# Patient Record
Sex: Female | Born: 1945
Health system: Southern US, Community
[De-identification: ages and names within clinical notes are randomized; demographics above are authoritative.]

## PROBLEM LIST (undated history)

## (undated) DIAGNOSIS — J449 Chronic obstructive pulmonary disease, unspecified: Secondary | ICD-10-CM

## (undated) DIAGNOSIS — C44622 Squamous cell carcinoma of skin of right upper limb, including shoulder: Secondary | ICD-10-CM

## (undated) DIAGNOSIS — J9602 Acute respiratory failure with hypercapnia: Secondary | ICD-10-CM

## (undated) DIAGNOSIS — J189 Pneumonia, unspecified organism: Secondary | ICD-10-CM

## (undated) DIAGNOSIS — K259 Gastric ulcer, unspecified as acute or chronic, without hemorrhage or perforation: Secondary | ICD-10-CM

## (undated) DIAGNOSIS — C801 Malignant (primary) neoplasm, unspecified: Secondary | ICD-10-CM

## (undated) DIAGNOSIS — J9601 Acute respiratory failure with hypoxia: Secondary | ICD-10-CM

## (undated) DIAGNOSIS — E119 Type 2 diabetes mellitus without complications: Secondary | ICD-10-CM

## (undated) DIAGNOSIS — K859 Acute pancreatitis without necrosis or infection, unspecified: Secondary | ICD-10-CM

## (undated) DIAGNOSIS — I509 Heart failure, unspecified: Secondary | ICD-10-CM

## (undated) DIAGNOSIS — J811 Chronic pulmonary edema: Secondary | ICD-10-CM

## (undated) DIAGNOSIS — I10 Essential (primary) hypertension: Secondary | ICD-10-CM

## (undated) DIAGNOSIS — G9340 Encephalopathy, unspecified: Secondary | ICD-10-CM

## (undated) HISTORY — PX: CARPAL TUNNEL RELEASE: SHX101

## (undated) HISTORY — PX: HAND SURGERY: SHX662

## (undated) HISTORY — DX: Chronic pulmonary edema: J81.1

## (undated) HISTORY — PX: CORONARY ANGIOPLASTY WITH STENT PLACEMENT: SHX49

## (undated) HISTORY — PX: ABDOMINAL HYSTERECTOMY: SHX81

## (undated) HISTORY — PX: KNEE SURGERY: SHX244

## (undated) HISTORY — DX: Squamous cell carcinoma of skin of right upper limb, including shoulder: C44.622

## (undated) HISTORY — DX: Gastric ulcer, unspecified as acute or chronic, without hemorrhage or perforation: K25.9

## (undated) HISTORY — DX: Encephalopathy, unspecified: G93.40

## (undated) HISTORY — PX: TOTAL ABDOMINAL HYSTERECTOMY: SHX209

## (undated) HISTORY — DX: Acute respiratory failure with hypercapnia: J96.02

## (undated) HISTORY — DX: Acute respiratory failure with hypoxia: J96.01

## (undated) HISTORY — DX: Pneumonia, unspecified organism: J18.9

## (undated) HISTORY — DX: Acute pancreatitis without necrosis or infection, unspecified: K85.90

---

## 2005-07-01 ENCOUNTER — Ambulatory Visit: Payer: Self-pay | Admitting: General Practice

## 2014-01-15 ENCOUNTER — Inpatient Hospital Stay: Payer: Self-pay | Admitting: Internal Medicine

## 2014-01-15 LAB — CBC WITH DIFFERENTIAL/PLATELET
Basophil #: 0.1 10*3/uL (ref 0.0–0.1)
Basophil %: 0.9 %
EOS ABS: 0 10*3/uL (ref 0.0–0.7)
EOS PCT: 0.5 %
HCT: 48.4 % — ABNORMAL HIGH (ref 35.0–47.0)
HGB: 16 g/dL (ref 12.0–16.0)
LYMPHS ABS: 1.6 10*3/uL (ref 1.0–3.6)
Lymphocyte %: 21.1 %
MCH: 27.7 pg (ref 26.0–34.0)
MCHC: 33 g/dL (ref 32.0–36.0)
MCV: 84 fL (ref 80–100)
Monocyte #: 0.4 x10 3/mm (ref 0.2–0.9)
Monocyte %: 5.7 %
NEUTROS ABS: 5.5 10*3/uL (ref 1.4–6.5)
Neutrophil %: 71.8 %
PLATELETS: 162 10*3/uL (ref 150–440)
RBC: 5.77 10*6/uL — ABNORMAL HIGH (ref 3.80–5.20)
RDW: 14 % (ref 11.5–14.5)
WBC: 7.6 10*3/uL (ref 3.6–11.0)

## 2014-01-15 LAB — URINALYSIS, COMPLETE
Bacteria: NONE SEEN
Bilirubin,UR: NEGATIVE
Blood: NEGATIVE
Glucose,UR: >=500 mg/dL (ref 0–75)
Leukocyte Esterase: NEGATIVE
NITRITE: NEGATIVE
Ph: 5 (ref 4.5–8.0)
Protein: 30
RBC, UR: NONE SEEN /HPF (ref 0–5)
Specific Gravity: 1.028 (ref 1.003–1.030)
Squamous Epithelial: 1
WBC UR: <1 /HPF (ref 0–5)

## 2014-01-15 LAB — COMPREHENSIVE METABOLIC PANEL
Albumin: 3.7 g/dL (ref 3.4–5.0)
Alkaline Phosphatase: 125 U/L — ABNORMAL HIGH
Anion Gap: 6 — ABNORMAL LOW (ref 7–16)
BUN: 11 mg/dL (ref 7–18)
Bilirubin,Total: 0.4 mg/dL (ref 0.2–1.0)
CHLORIDE: 102 mmol/L (ref 98–107)
Calcium, Total: 9.2 mg/dL (ref 8.5–10.1)
Co2: 26 mmol/L (ref 21–32)
Creatinine: 0.52 mg/dL — ABNORMAL LOW (ref 0.60–1.30)
EGFR (Non-African Amer.): >60
Glucose: 270 mg/dL — ABNORMAL HIGH (ref 65–99)
Osmolality: 277 (ref 275–301)
POTASSIUM: 4.2 mmol/L (ref 3.5–5.1)
SGOT(AST): 27 U/L (ref 15–37)
SGPT (ALT): 28 U/L (ref 12–78)
Sodium: 134 mmol/L — ABNORMAL LOW (ref 136–145)
Total Protein: 7.3 g/dL (ref 6.4–8.2)

## 2014-01-15 LAB — TROPONIN I: Troponin-I: <0.02 ng/mL

## 2014-01-15 LAB — PROTIME-INR
INR: 0.9
Prothrombin Time: 12.5 secs (ref 11.5–14.7)

## 2014-01-15 LAB — CK TOTAL AND CKMB (NOT AT ARMC)
CK, TOTAL: 36 U/L
CK-MB: 0.8 ng/mL (ref 0.5–3.6)

## 2014-01-15 LAB — HEMOGLOBIN A1C: Hemoglobin A1C: 10.8 % — ABNORMAL HIGH (ref 4.2–6.3)

## 2014-01-15 LAB — CK-MB: CK-MB: 0.9 ng/mL (ref 0.5–3.6)

## 2014-01-15 LAB — PRO B NATRIURETIC PEPTIDE: B-Type Natriuretic Peptide: 63 pg/mL (ref 0–125)

## 2014-01-15 LAB — APTT: Activated PTT: 25.6 secs (ref 23.6–35.9)

## 2014-01-16 LAB — CBC WITH DIFFERENTIAL/PLATELET
Basophil #: 0 10*3/uL (ref 0.0–0.1)
Basophil %: 0.3 %
Eosinophil #: 0 10*3/uL (ref 0.0–0.7)
Eosinophil %: 0 %
HCT: 47.6 % — AB (ref 35.0–47.0)
HGB: 15.7 g/dL (ref 12.0–16.0)
LYMPHS ABS: 0.6 10*3/uL — AB (ref 1.0–3.6)
Lymphocyte %: 8.2 %
MCH: 27.6 pg (ref 26.0–34.0)
MCHC: 32.9 g/dL (ref 32.0–36.0)
MCV: 84 fL (ref 80–100)
MONO ABS: 0.1 x10 3/mm — AB (ref 0.2–0.9)
MONOS PCT: 1 %
Neutrophil #: 6.1 10*3/uL (ref 1.4–6.5)
Neutrophil %: 90.5 %
PLATELETS: 176 10*3/uL (ref 150–440)
RBC: 5.67 10*6/uL — ABNORMAL HIGH (ref 3.80–5.20)
RDW: 14.1 % (ref 11.5–14.5)
WBC: 6.7 10*3/uL (ref 3.6–11.0)

## 2014-01-16 LAB — LIPID PANEL
CHOLESTEROL: 258 mg/dL — AB (ref 0–200)
HDL Cholesterol: 35 mg/dL — ABNORMAL LOW (ref 40–60)
LDL CHOLESTEROL, CALC: 156 mg/dL — AB (ref 0–100)
TRIGLYCERIDES: 337 mg/dL — AB (ref 0–200)
VLDL Cholesterol, Calc: 67 mg/dL — ABNORMAL HIGH (ref 5–40)

## 2014-01-16 LAB — TROPONIN I: Troponin-I: <0.02 ng/mL

## 2014-01-16 LAB — CK-MB: CK-MB: 0.7 ng/mL (ref 0.5–3.6)

## 2014-01-17 LAB — URINE CULTURE

## 2014-01-20 LAB — CULTURE, BLOOD (SINGLE)

## 2014-12-22 NOTE — Discharge Summary (Signed)
PATIENT NAME:  Sarah White, Sarah White MR#:  [id number] DATE OF BIRTH:  1946-06-26  DATE OF ADMISSION:  01/15/2014 DATE OF DISCHARGE:  01/17/2014  ADMISSION DIAGNOSIS: 1.  Acute chronic obstructive pulmonary disease exacerbation.   DISCHARGE DIAGNOSES: 1.  Acute chronic obstructive pulmonary disease exacerbation with no formal diagnosis of chronic obstructive pulmonary disease; however, on chest x-ray, it is consistent with chronic obstructive pulmonary disease.  2.  New-onset diabetes.  3.  Elevated blood pressure.  4.  Tobacco dependence. 5.  History of coronary artery disease.  6.  Elevated cholesterol.   CONSULTATIONS:  None.   LABORATORY DATA AT DISCHARGE: White blood cells 6.7, hemoglobin 16, hematocrit 48. Platelets are 176.   Cholesterol 258. LDL is 156, HDL 35. VLDL is 67.   Hemoglobin A1c was 10.1.   HOSPITAL COURSE:  A 69 year old female presented with shortness of breath, found to have COPD exacerbation. For further details, please refer to the H and P.  1. Chronic obstructive pulmonary disease exacerbation. The patient has no formal diagnosis of COPD, but her chest x-ray was showing COPD. She does have ongoing use of tobacco dependence. The patient was initially placed on oxygen, did well with oxygen. This has been weaned off. She is not requiring oxygen at discharge. She has no wheezing. Her lungs are clear to auscultation. She will be weaned off her steroids, continue on inhalers and azithromycin.  2.  New-onset diabetes. I discussed briefly with the patient about diabetes. We also had inpatient diabetes education, as well as outpatient referral. I did recommend that patient start on Lantus, but she was very adamant about not starting any kind of insulin at this time. She did say the metformin would be okay to start on. I reviewed the side effects, alternatives, benefits and risks of metformin, and she will start on this.  3.  Elevated blood pressure. We increased her Norvasc dose.   4.  Tobacco dependence. The patient was counseled. Nicotine patch was placed.  5.  History of coronary artery disease. The patient will continue on aspirin and metoprolol. Her cholesterol is elevated, but this will be referred to her PCP.   DISCHARGE MEDICATIONS: 1.  Aspirin 81 mg daily.  2.  Prednisone taper starting at 50 mg, taper by 10 mg every 2 days.  3.  Azithromycin 500 mg daily for 3 days.  4.  Metformin 500 mg b.i.d.  5.  Metoprolol 50 mg b.i.d.  6.  Spiriva 18 mcg daily.  7.  Norvasc 10 mg daily.  8.  Nicotine patch 14 mg per 24 hours.   DISCHARGE DIET:  Low sodium, ADA diet.   DISCHARGE ACTIVITY: As tolerated.   DISCHARGE REFERRAL: Diabetes education.   DISCHARGE FOLLOWUP:  We did arrange a [facility] followup.     TIME SPENT: 40 minutes.   The patient was medically stable for discharge.     ____________________________ Cordarrius Coad P. Benjie Karvonen, MD spm:dmm D: 01/17/2014 12:40:29 ET T: 01/17/2014 13:02:07 ET JOB#: [id number]  cc: Ayani Ospina P. Benjie Karvonen, MD, <Dictator> [facility] P Chanae Gemma MD ELECTRONICALLY SIGNED 01/17/2014 13:46

## 2014-12-22 NOTE — H&P (Signed)
PATIENT NAME:  Sarah White, TEXEIRA MR#:  [id number] DATE OF BIRTH:  [birth year]  DATE OF ADMISSION:  01/15/2014  PRIMARY CARE PHYSICIAN:  None.  EMERGENCY ROOM PHYSICIAN:  Latina Craver, MD  CHIEF COMPLAINT: Trouble breathing.   HISTORY OF PRESENT ILLNESS: The patient is a 69 year old female patient, not seen primary doctor for a long time.  Does not have primary doctor.  Comes in because of shortness of breath. The patient has been having some cough, trouble breathing, and fever for about 1 week. The patient denies any chest pain. Does have lots of wheezing and cough for about a week. Did not try any medications for that.  Patient here has blood pressure of 224/125 when she came and also found to have significant wheezing.  We are admitting her for COPD exacerbation and malignant hypertension.    PAST MEDICAL HISTORY: Significant for coronary artery disease with 2 stents placement. The patient had 2 stents placed 12 years ago.   MEDICATIONS:  Takes only baby aspirin.   ALLERGIES: No known allergies.   SOCIAL HISTORY: Smokes half pack per day for 40 years. No alcohol. No drugs. The patient works at [employer] as a Radiation protection practitioner.   PAST SURGICAL HISTORY: Significant for 2 stents placed, carpal tunnel release, and left total knee replacement.   FAMILY HISTORY: Significant for mother had CHF.    REVIEW OF SYSTEMS:  CONSTITUTIONAL: Has fever and fatigue. EYES:  No blurred vision.  EARS, NOSE, AND THROAT: No tinnitus. No ear pain. No epistaxis. No difficulty swallowing.  RESPIRATORY: Has cough, wheezing, and shortness of breath.  CARDIOVASCULAR: No chest pain. Feels chest tightness due to trouble breathing. No orthopnea, no PND.  GASTROINTESTINAL: No nausea, no vomiting or abdominal pain.  GENITOURINARY:  No dysuria. ENDOCRINE: No polyuria, nocturia.  INTEGUMENT: No skin rashes.  MUSCULOSKELETAL:  No joint pain.  NEUROLOGIC: No numbness or weakness.   PSYCHIATRIC:  Feels a lot  of stress at work.  No depression.     PHYSICAL EXAMINATION:  VITAL SIGNS: Temperature 98.3, heart rate 112. Blood pressure initially 224/125.  During my visit, it was 187/80. Saturations, she is 94% on 2 liters, 90% on room air.  GENERAL: She is alert, awake, oriented, elderly female not in distress, answering questions appropriately.  HEAD: Atraumatic, normocephalic.  EYES: Pupils equal, reacting to light. Extraocular movements are intact.  EARS, NOSE, AND THROAT:  No tympanic membrane congestion. No turbinate hypertrophy. No oropharyngeal erythema.  NECK: Supple. No JVD. No carotid bruit.  CARDIOVASCULAR: S1, S2 regular, tachycardic. Peripheral pulses are intact. Peripheral pulses are equal at carotid.  Doralis Pedis and femoral artery. RESPIRATORY: Bilateral expiratory wheeze in all lung fields. The patient has been having terrible cough and the patient not using accessory muscles of respiration.  SKIN: Inspection, warm and dry.  MUSCULOSKELETAL: Normal gait and station.  EXTREMITIES:   (no edema,no cyanosis,all extremities move x 4.    LYMPHATIC: no  lymphadenopathy in axilla or cervical regions. NEUROLOGIC: Alert, awake, oriented neurologically  intact.DTR 2+  bilaterally. ,no sensory deficit. PSYCHIATRIC: Mood and affect are within normal limits.   DIAGNOSTIC DATA:  UA is yellow-colored urine, hazy urine. Chest x-ray shows COPD, no evidence of superimposed cardiac disease. INR 0.9, BNP 63. WBC 7.8, hemoglobin 16, hematocrit 48.4, platelets 162,000.   Electrolytes: Sodium is 134, potassium 4.2, chloride 102, bicarb 26, BUN 11, creatinine 0.5, glucose 270. The patient's alkaline phosphatase 125.  ABG: pH is 7.38, pCO2 of 46, pO2 of 57  and this is done on room air.   ASSESSMENT AND PLAN:  1. The patient is a 69 year old female with chronic obstructive pulmonary disease exacerbation. The patient's oxygen saturations were 90% on room air. The patient has hypoxia with respiratory failure  secondary to chronic obstructive pulmonary disease exacerbation and bronchitis. Admit to hospitalist service on telemetry. Continue IV steroids along with Rocephin, Zithromax, nebulizers, and oxygen and see how she does.  2. History of coronary artery disease with 2 stents, now has malignant hypertension. Likely the blood pressure is likely secondary to her trouble breathing and cough.  We started her on beta blockers along with Norvasc.  We can use hydralazine 20 mg IV q. 4 hours p.r.n. for if BP  more than 160/90.  3. Tobacco abuse. Counseled against smoking for 3 minutes. The patient says she is in a lot of stress and she will consider it.   TIME SPENT:  About 55 minutes on this history and physical:    ____________________________ Epifanio Lesches, MD sk:dd D: 01/15/2014 18:03:51 ET T: 01/15/2014 19:08:53 ET JOB#: [id number]  cc: Epifanio Lesches, MD, <Dictator> Epifanio Lesches MD ELECTRONICALLY SIGNED 01/25/2014 12:38

## 2015-07-17 ENCOUNTER — Emergency Department: Payer: BLUE CROSS/BLUE SHIELD

## 2015-07-17 ENCOUNTER — Inpatient Hospital Stay
Admission: EM | Admit: 2015-07-17 | Discharge: 2015-07-25 | DRG: 193 | Disposition: A | Discharge status: Home / Self Care | Payer: BLUE CROSS/BLUE SHIELD | Attending: Internal Medicine | Admitting: Internal Medicine

## 2015-07-17 ENCOUNTER — Encounter: Payer: Self-pay | Admitting: Emergency Medicine

## 2015-07-17 DIAGNOSIS — G9340 Encephalopathy, unspecified: Secondary | ICD-10-CM | POA: Diagnosis not present

## 2015-07-17 DIAGNOSIS — E1122 Type 2 diabetes mellitus with diabetic chronic kidney disease: Secondary | ICD-10-CM

## 2015-07-17 DIAGNOSIS — Z66 Do not resuscitate: Secondary | ICD-10-CM | POA: Diagnosis present

## 2015-07-17 DIAGNOSIS — I2510 Atherosclerotic heart disease of native coronary artery without angina pectoris: Secondary | ICD-10-CM | POA: Diagnosis present

## 2015-07-17 DIAGNOSIS — F17200 Nicotine dependence, unspecified, uncomplicated: Secondary | ICD-10-CM | POA: Diagnosis present

## 2015-07-17 DIAGNOSIS — F17210 Nicotine dependence, cigarettes, uncomplicated: Secondary | ICD-10-CM | POA: Diagnosis present

## 2015-07-17 DIAGNOSIS — I110 Hypertensive heart disease with heart failure: Secondary | ICD-10-CM | POA: Diagnosis present

## 2015-07-17 DIAGNOSIS — Y929 Unspecified place or not applicable: Secondary | ICD-10-CM

## 2015-07-17 DIAGNOSIS — E119 Type 2 diabetes mellitus without complications: Secondary | ICD-10-CM

## 2015-07-17 DIAGNOSIS — G92 Toxic encephalopathy: Secondary | ICD-10-CM | POA: Diagnosis not present

## 2015-07-17 DIAGNOSIS — J449 Chronic obstructive pulmonary disease, unspecified: Secondary | ICD-10-CM

## 2015-07-17 DIAGNOSIS — Z7982 Long term (current) use of aspirin: Secondary | ICD-10-CM | POA: Diagnosis not present

## 2015-07-17 DIAGNOSIS — J189 Pneumonia, unspecified organism: Principal | ICD-10-CM

## 2015-07-17 DIAGNOSIS — J9690 Respiratory failure, unspecified, unspecified whether with hypoxia or hypercapnia: Secondary | ICD-10-CM | POA: Diagnosis not present

## 2015-07-17 DIAGNOSIS — Z955 Presence of coronary angioplasty implant and graft: Secondary | ICD-10-CM | POA: Diagnosis not present

## 2015-07-17 DIAGNOSIS — R000 Tachycardia, unspecified: Secondary | ICD-10-CM | POA: Diagnosis present

## 2015-07-17 DIAGNOSIS — R0902 Hypoxemia: Secondary | ICD-10-CM

## 2015-07-17 DIAGNOSIS — I5021 Acute systolic (congestive) heart failure: Secondary | ICD-10-CM

## 2015-07-17 DIAGNOSIS — R42 Dizziness and giddiness: Secondary | ICD-10-CM | POA: Diagnosis present

## 2015-07-17 DIAGNOSIS — R748 Abnormal levels of other serum enzymes: Secondary | ICD-10-CM | POA: Diagnosis present

## 2015-07-17 DIAGNOSIS — I5023 Acute on chronic systolic (congestive) heart failure: Secondary | ICD-10-CM | POA: Diagnosis present

## 2015-07-17 DIAGNOSIS — J9602 Acute respiratory failure with hypercapnia: Secondary | ICD-10-CM | POA: Diagnosis not present

## 2015-07-17 DIAGNOSIS — Z8542 Personal history of malignant neoplasm of other parts of uterus: Secondary | ICD-10-CM | POA: Diagnosis not present

## 2015-07-17 DIAGNOSIS — T424X5A Adverse effect of benzodiazepines, initial encounter: Secondary | ICD-10-CM | POA: Diagnosis not present

## 2015-07-17 DIAGNOSIS — J9601 Acute respiratory failure with hypoxia: Secondary | ICD-10-CM | POA: Diagnosis present

## 2015-07-17 DIAGNOSIS — J9622 Acute and chronic respiratory failure with hypercapnia: Secondary | ICD-10-CM | POA: Diagnosis present

## 2015-07-17 DIAGNOSIS — J811 Chronic pulmonary edema: Secondary | ICD-10-CM | POA: Diagnosis not present

## 2015-07-17 DIAGNOSIS — M545 Low back pain, unspecified: Secondary | ICD-10-CM

## 2015-07-17 DIAGNOSIS — I129 Hypertensive chronic kidney disease with stage 1 through stage 4 chronic kidney disease, or unspecified chronic kidney disease: Secondary | ICD-10-CM

## 2015-07-17 DIAGNOSIS — J9621 Acute and chronic respiratory failure with hypoxia: Secondary | ICD-10-CM | POA: Diagnosis present

## 2015-07-17 DIAGNOSIS — Z9114 Patient's other noncompliance with medication regimen: Secondary | ICD-10-CM | POA: Diagnosis not present

## 2015-07-17 DIAGNOSIS — I509 Heart failure, unspecified: Secondary | ICD-10-CM | POA: Diagnosis not present

## 2015-07-17 DIAGNOSIS — J441 Chronic obstructive pulmonary disease with (acute) exacerbation: Secondary | ICD-10-CM | POA: Diagnosis present

## 2015-07-17 DIAGNOSIS — I493 Ventricular premature depolarization: Secondary | ICD-10-CM | POA: Diagnosis present

## 2015-07-17 DIAGNOSIS — R0602 Shortness of breath: Secondary | ICD-10-CM | POA: Diagnosis not present

## 2015-07-17 DIAGNOSIS — E872 Acidosis: Secondary | ICD-10-CM | POA: Diagnosis present

## 2015-07-17 DIAGNOSIS — J810 Acute pulmonary edema: Secondary | ICD-10-CM | POA: Diagnosis not present

## 2015-07-17 DIAGNOSIS — E785 Hyperlipidemia, unspecified: Secondary | ICD-10-CM | POA: Diagnosis present

## 2015-07-17 DIAGNOSIS — Z79899 Other long term (current) drug therapy: Secondary | ICD-10-CM | POA: Diagnosis not present

## 2015-07-17 HISTORY — DX: Malignant (primary) neoplasm, unspecified: C80.1

## 2015-07-17 HISTORY — DX: Chronic obstructive pulmonary disease, unspecified: J44.9

## 2015-07-17 HISTORY — DX: Type 2 diabetes mellitus without complications: E11.9

## 2015-07-17 LAB — COMPREHENSIVE METABOLIC PANEL
ALBUMIN: 3.9 g/dL (ref 3.5–5.0)
ALK PHOS: 127 U/L — AB (ref 38–126)
ALT: 51 U/L (ref 14–54)
AST: 30 U/L (ref 15–41)
Anion gap: 9 (ref 5–15)
BILIRUBIN TOTAL: 0.7 mg/dL (ref 0.3–1.2)
BUN: 9 mg/dL (ref 6–20)
CALCIUM: 9.1 mg/dL (ref 8.9–10.3)
CO2: 26 mmol/L (ref 22–32)
Chloride: 101 mmol/L (ref 101–111)
Creatinine, Ser: 0.7 mg/dL (ref 0.44–1.00)
GFR calc Af Amer: >60 mL/min (ref >60)
GLUCOSE: 330 mg/dL — AB (ref 65–99)
POTASSIUM: 3.6 mmol/L (ref 3.5–5.1)
Sodium: 136 mmol/L (ref 135–145)
TOTAL PROTEIN: 6.9 g/dL (ref 6.5–8.1)

## 2015-07-17 LAB — CBC
HEMATOCRIT: 46.1 % (ref 35.0–47.0)
HEMOGLOBIN: 15.2 g/dL (ref 12.0–16.0)
MCH: 27.2 pg (ref 26.0–34.0)
MCHC: 33.1 g/dL (ref 32.0–36.0)
MCV: 82.3 fL (ref 80.0–100.0)
Platelets: 142 10*3/uL — ABNORMAL LOW (ref 150–440)
RBC: 5.6 MIL/uL — ABNORMAL HIGH (ref 3.80–5.20)
RDW: 14.3 % (ref 11.5–14.5)
WBC: 8.1 10*3/uL (ref 3.6–11.0)

## 2015-07-17 LAB — GLUCOSE, CAPILLARY: GLUCOSE-CAPILLARY: 296 mg/dL — AB (ref 65–99)

## 2015-07-17 LAB — BRAIN NATRIURETIC PEPTIDE: B Natriuretic Peptide: 394 pg/mL — ABNORMAL HIGH (ref 0.0–100.0)

## 2015-07-17 LAB — TROPONIN I
TROPONIN I: 0.03 ng/mL (ref <0.031)
Troponin I: 0.03 ng/mL (ref <0.031)

## 2015-07-17 MED ORDER — ALBUTEROL SULFATE (2.5 MG/3ML) 0.083% IN NEBU
2.5000 mg | INHALATION_SOLUTION | RESPIRATORY_TRACT | Status: DC | PRN
Start: 2015-07-17 — End: 2015-07-22
  Administered 2015-07-22: 2.5 mg via RESPIRATORY_TRACT
  Filled 2015-07-17 (×2): qty 3

## 2015-07-17 MED ORDER — SODIUM CHLORIDE 0.9 % IV SOLN
INTRAVENOUS | Status: DC
  Administered 2015-07-17 – 2015-07-18 (×2): via INTRAVENOUS

## 2015-07-17 MED ORDER — ONDANSETRON HCL 4 MG/2ML IJ SOLN
4.0000 mg | INTRAMUSCULAR | Status: DC | PRN
  Administered 2015-07-17 – 2015-07-20 (×3): 4 mg via INTRAVENOUS
  Filled 2015-07-17 (×3): qty 2

## 2015-07-17 MED ORDER — DEXTROSE 5 % IV SOLN
1.0000 g | INTRAVENOUS | Status: DC
Start: 2015-07-18 — End: 2015-07-19
  Administered 2015-07-18: 1 g via INTRAVENOUS
  Filled 2015-07-17 (×2): qty 10

## 2015-07-17 MED ORDER — IPRATROPIUM-ALBUTEROL 0.5-2.5 (3) MG/3ML IN SOLN
3.0000 mL | Freq: Once | RESPIRATORY_TRACT | Status: AC
  Administered 2015-07-17: 3 mL via RESPIRATORY_TRACT
  Filled 2015-07-17: qty 3

## 2015-07-17 MED ORDER — IPRATROPIUM-ALBUTEROL 0.5-2.5 (3) MG/3ML IN SOLN
3.0000 mL | Freq: Four times a day (QID) | RESPIRATORY_TRACT | Status: DC
  Administered 2015-07-18 – 2015-07-22 (×17): 3 mL via RESPIRATORY_TRACT
  Filled 2015-07-17 (×18): qty 3

## 2015-07-17 MED ORDER — METOPROLOL TARTRATE 25 MG PO TABS
25.0000 mg | ORAL_TABLET | Freq: Two times a day (BID) | ORAL | Status: DC
  Administered 2015-07-17 – 2015-07-22 (×10): 25 mg via ORAL
  Filled 2015-07-17 (×10): qty 1

## 2015-07-17 MED ORDER — ASPIRIN EC 81 MG PO TBEC
81.0000 mg | DELAYED_RELEASE_TABLET | Freq: Every day | ORAL | Status: DC
  Administered 2015-07-18 – 2015-07-25 (×8): 81 mg via ORAL
  Filled 2015-07-17 (×9): qty 1

## 2015-07-17 MED ORDER — ENOXAPARIN SODIUM 40 MG/0.4ML ~~LOC~~ SOLN
40.0000 mg | SUBCUTANEOUS | Status: DC
  Administered 2015-07-17 – 2015-07-24 (×8): 40 mg via SUBCUTANEOUS
  Filled 2015-07-17 (×8): qty 0.4

## 2015-07-17 MED ORDER — AZITHROMYCIN 250 MG PO TABS: 500.0000 mg | ORAL_TABLET | ORAL | Status: DC

## 2015-07-17 MED ORDER — DEXTROSE 5 % IV SOLN
1.0000 g | Freq: Once | INTRAVENOUS | Status: AC
  Administered 2015-07-17: 1 g via INTRAVENOUS
  Filled 2015-07-17: qty 10

## 2015-07-17 MED ORDER — DEXTROSE 5 % IV SOLN
1.0000 g | INTRAVENOUS | Status: DC
  Filled 2015-07-17: qty 10

## 2015-07-17 MED ORDER — HYDRALAZINE HCL 20 MG/ML IJ SOLN: 10.0000 mg | INTRAMUSCULAR | Status: DC | PRN

## 2015-07-17 MED ORDER — ACETAMINOPHEN 325 MG PO TABS
650.0000 mg | ORAL_TABLET | Freq: Four times a day (QID) | ORAL | Status: DC | PRN
  Administered 2015-07-17 – 2015-07-25 (×4): 650 mg via ORAL
  Filled 2015-07-17 (×5): qty 2

## 2015-07-17 MED ORDER — DEXTROSE 5 % IV SOLN
500.0000 mg | Freq: Once | INTRAVENOUS | Status: AC
  Administered 2015-07-17: 500 mg via INTRAVENOUS
  Filled 2015-07-17: qty 500

## 2015-07-17 MED ORDER — INSULIN ASPART 100 UNIT/ML ~~LOC~~ SOLN
0.0000 [IU] | Freq: Every day | SUBCUTANEOUS | Status: DC
  Administered 2015-07-17: 3 [IU] via SUBCUTANEOUS
  Filled 2015-07-17: qty 3

## 2015-07-17 MED ORDER — AZITHROMYCIN 250 MG PO TABS
500.0000 mg | ORAL_TABLET | ORAL | Status: DC
Start: 2015-07-18 — End: 2015-07-22
  Administered 2015-07-18 – 2015-07-21 (×4): 500 mg via ORAL
  Filled 2015-07-17 (×4): qty 2

## 2015-07-17 MED ORDER — IPRATROPIUM-ALBUTEROL 18-103 MCG/ACT IN AERO: 1.0000 | INHALATION_SPRAY | RESPIRATORY_TRACT | Status: DC | PRN

## 2015-07-17 MED ORDER — INSULIN ASPART 100 UNIT/ML ~~LOC~~ SOLN
0.0000 [IU] | Freq: Three times a day (TID) | SUBCUTANEOUS | Status: DC
  Administered 2015-07-18: 12:00:00 7 [IU] via SUBCUTANEOUS
  Administered 2015-07-18: 08:00:00 3 [IU] via SUBCUTANEOUS
  Filled 2015-07-17: qty 3
  Filled 2015-07-17: qty 7

## 2015-07-17 MED ORDER — NICOTINE 14 MG/24HR TD PT24
14.0000 mg | MEDICATED_PATCH | Freq: Every day | TRANSDERMAL | Status: DC
  Filled 2015-07-17 (×4): qty 1

## 2015-07-17 MED ORDER — AMLODIPINE BESYLATE 5 MG PO TABS
5.0000 mg | ORAL_TABLET | Freq: Every day | ORAL | Status: DC
  Administered 2015-07-17 – 2015-07-18 (×2): 5 mg via ORAL
  Filled 2015-07-17 (×2): qty 1

## 2015-07-17 NOTE — ED Notes (Signed)
Pt reports shortness of breath x 2days, reports dizziness due to shortness of breath. Hx of COPD, pt does smoke cigarettes.

## 2015-07-17 NOTE — H&P (Signed)
[place] [facility] [place] NAME: Zuleidy Saner    MR#:  [id number]  DATE OF BIRTH:  [birth year]  DATE OF ADMISSION:  07/17/2015  PRIMARY CARE PHYSICIAN: No PCP Per Patient   REQUESTING/REFERRING PHYSICIAN: Edd Fabian  CHIEF COMPLAINT:   Shortness of breath HISTORY OF PRESENT ILLNESS:  Deema Leamer  is a 69 y.o. female with a known history of COPD, diabetes mellitus, [place] artery disease status post stents is presenting to the ED with a chief complaint of worsening of shortness of breath. Patient was feeling dizzy but denies any loss of consciousness. Denies any chest pain. Has been having nonproductive cough for the past few days. No sick contacts. Denies any fever. Patient was not seen by any primary care physician and cardiologist in the past several years. Chest x-ray has revealed a left-sided pneumonia  PAST MEDICAL HISTORY:   Past Medical History  Diagnosis Date  . COPD (chronic obstructive pulmonary disease) ([place])   . Diabetes mellitus without complication ([place])   . Cancer [facility])     uterine    PAST SURGICAL HISTOIRY:   Past Surgical History  Procedure Laterality Date  . Knee surgery Left   . Carpal tunnel release Bilateral   . Coronary angioplasty with stent placement    . Abdominal hysterectomy    . Cesarean section      SOCIAL HISTORY:   Social History  Substance Use Topics  . Smoking status: Current Every Day Smoker  . Smokeless tobacco: Not on file  . Alcohol Use: No    FAMILY HISTORY:  No family history on file.  DRUG ALLERGIES:  No Known Allergies  REVIEW OF SYSTEMS:  CONSTITUTIONAL: No fever, fatigue or weakness.  EYES: No blurred or double vision.  EARS, NOSE, AND THROAT: No tinnitus or ear pain.  RESPIRATORY: Reporting nonproductive cough, worsening of shortness of breath, denies wheezing or hemoptysis.  CARDIOVASCULAR: No chest pain, orthopnea, edema.  GASTROINTESTINAL: No nausea, vomiting,  diarrhea or abdominal pain.  GENITOURINARY: No dysuria, hematuria.  ENDOCRINE: No polyuria, nocturia,  HEMATOLOGY: No anemia, easy bruising or bleeding SKIN: No rash or lesion. MUSCULOSKELETAL: No joint pain or arthritis.   NEUROLOGIC: No tingling, numbness, weakness.  PSYCHIATRY: No anxiety or depression.   MEDICATIONS AT HOME:   Prior to Admission medications   Medication Sig Start Date End Date Taking? Authorizing Provider  albuterol-ipratropium (COMBIVENT) 18-103 MCG/ACT inhaler Inhale 1 puff into the lungs every 4 (four) hours as needed for wheezing or shortness of breath.   Yes Historical Provider, MD  aspirin EC 81 MG tablet Take 81 mg by mouth daily.   Yes Historical Provider, MD      VITAL SIGNS:  Blood pressure 191/108, pulse 92, temperature 98.4 F (36.9 C), temperature source Oral, resp. rate 21, height 5\' 4"  (1.626 m), weight 90.719 kg (200 lb), SpO2 94 %.  PHYSICAL EXAMINATION:  GENERAL:  69 y.o.-year-old patient lying in the bed with no acute distress.  EYES: Pupils equal, round, reactive to light and accommodation. No scleral icterus. Extraocular muscles intact.  HEENT: Head atraumatic, normocephalic. Oropharynx and nasopharynx clear.  NECK:  Supple, no jugular venous distention. No thyroid enlargement, no tenderness.  LUNGS: Coarse breath sounds on the left side with moderate air entry bilaterally, no wheezing, rales,rhonchi or crepitation. No use of accessory muscles of respiration.  CARDIOVASCULAR: S1, S2 normal. No murmurs, rubs, or gallops.  ABDOMEN: Soft, nontender, nondistended. Bowel sounds present. No organomegaly or mass.  EXTREMITIES:  No pedal edema, cyanosis, or clubbing.  NEUROLOGIC: Cranial nerves II through XII are intact. Muscle strength 5/5 in all extremities. Sensation intact. Gait not checked.  PSYCHIATRIC: The patient is alert and oriented x 3.  SKIN: No obvious rash, lesion, or ulcer.   LABORATORY PANEL:   CBC  Recent Labs Lab  07/17/15 1433  WBC 8.1  HGB 15.2  HCT 46.1  PLT 142*   ------------------------------------------------------------------------------------------------------------------  Chemistries   Recent Labs Lab 07/17/15 1433  NA 136  K 3.6  CL 101  CO2 26  GLUCOSE 330*  BUN 9  CREATININE 0.70  CALCIUM 9.1  AST 30  ALT 51  ALKPHOS 127*  BILITOT 0.7   ------------------------------------------------------------------------------------------------------------------  Cardiac Enzymes  Recent Labs Lab 07/17/15 1433  TROPONINI 0.03   ------------------------------------------------------------------------------------------------------------------  RADIOLOGY:  Dg Chest 2 View  07/17/2015  CLINICAL DATA:  Initial encounter for increasing shortness of breath with chest tightness since yesterday. EXAM: CHEST  2 VIEW COMPARISON:  01/15/2014. FINDINGS: The lungs are clear without focal pneumonia, edema, no evidence for pulmonary edema. Airspace opacity in the left costophrenic sulcus on the PA film. The cardiopericardial silhouette is within normal limits for size. Imaged bony structures of the thorax are intact. IMPRESSION: Focal airspace disease in the lateral left lung base. Pneumonia would be a consideration. Consider follow-up to ensure complete resolution. Electronically Signed   By: Misty Stanley M.D.   On: 07/17/2015 17:15    EKG:   Orders placed or performed during the hospital encounter of 07/17/15  . ED EKG  . ED EKG  . EKG 12-Lead  . EKG 12-Lead    IMPRESSION AND PLAN:   Patient is presenting to the ED with a chief complaint of worsening of shortness of breath. Patient was feeling dizzy but denies any loss of consciousness. Denies any chest pain. Has been having nonproductive cough for the past few days. No sick contacts. Denies any fever. Patient was not seen by any primary care physician and cardiologist in the past several years. Chest x-ray has revealed a left-sided  pneumonia   Assessment and plan  1. Community acquired pneumonia-left lower lobe with hypoxia We will treat the patient with the IV Rocephin and azithromycin Sputum culture and sensitivity is ordered including Gram stain Will check urine for Legionella antigen and streptococcal antigen Provide nebulizer treatments as needed basis Provide oxygen via nasal cannula and check for ambulatory pulse ox  2. Malignant hypertension Patient is not on any blood pressure medications We'll start the patient on metoprolol and small dose amlodipine and titrate as needed basis  3. Diabetes mellitus-noncompliant with medications Check hemoglobin A1c and provide sliding scale insulin  4. History of coronary artery disease status post stents Patient is asymptomatic but not seen by any cardiologist in several years We will start the patient on aspirin 81 mg and low-dose beta blocker Check fasting lipid panel  5. Tobacco abuse Counseled patient to quit smoking for 3-5 minutes. Provide nicotine patch. Patient is agreeable  6. Elevated alkaline phosphatase Rest of the LFTs are normal, repeat alkaline phosphatase in a.m.   All the records are reviewed and case discussed with ED provider. Management plans discussed with the patient, family and they are in agreement.  CODE STATUS: DO NOT RESUSCITATE, son is the healthcare power of attorney  TOTAL TIME TAKING CARE OF THIS PATIENT: 45 minutes.    Nicholes Mango M.D on 07/17/2015 at 7:13 PM  Between 7am to 6pm - Pager - [phone]  After  6pm go to [email or web address] - password [email or web address]  [facility]  Office  [phone]  CC: Primary care physician; No PCP Per Patient

## 2015-07-17 NOTE — ED Provider Notes (Signed)
[facility] Emergency Department Provider Note  ____________________________________________  Time seen: Approximately 5:01 PM  I have reviewed the triage vital signs and the nursing notes.   HISTORY  Chief Complaint Shortness of Breath and Dizziness    HPI Sarah White is a 69 y.o. female history of COPD, diabetes, coronary artery disease status post stents who presents for evaluation of gradual onset shortness of breath, worse with exertion and lying flat, currently moderate in severity, constant since onset. No chest pain. She has also had nonproductive cough today. No fevers or chills. No vomiting or diarrhea. Has had similar symptoms in the setting of pneumonia.   Past Medical History  Diagnosis Date  . COPD (chronic obstructive pulmonary disease) ([place])   . Diabetes mellitus without complication ([place])   . Cancer [facility])     uterine    There are no active problems to display for this patient.   Past Surgical History  Procedure Laterality Date  . Knee surgery Left   . Carpal tunnel release Bilateral   . Coronary angioplasty with stent placement    . Abdominal hysterectomy    . Cesarean section      No current outpatient prescriptions on file.  Allergies Review of patient's allergies indicates no known allergies.  No family history on file.  Social History Social History  Substance Use Topics  . Smoking status: Current Every Day Smoker  . Smokeless tobacco: None  . Alcohol Use: No    Review of Systems Constitutional: No fever/chills Eyes: No visual changes. ENT: No sore throat. Cardiovascular: Denies chest pain. Respiratory: + shortness of breath. Gastrointestinal: No abdominal pain.  No nausea, no vomiting.  No diarrhea.  No constipation. Genitourinary: Negative for dysuria. Musculoskeletal: Negative for back pain. Skin: Negative for rash. Neurological: Negative for headaches, focal weakness or numbness.  10-point ROS  otherwise negative.  ____________________________________________   PHYSICAL EXAM:  VITAL SIGNS: ED Triage Vitals  Enc Vitals Group     BP 07/17/15 1428 200/111 mmHg     Pulse Rate 07/17/15 1427 114     Resp 07/17/15 1427 26     Temp 07/17/15 1427 98.4 F (36.9 C)     Temp Source 07/17/15 1427 Oral     SpO2 07/17/15 1428 93 %     Weight 07/17/15 1427 200 lb (90.719 kg)     Height 07/17/15 1427 5\' 4"  (1.626 m)     Head Cir --      Peak Flow --      Pain Score 07/17/15 1427 7     Pain Loc --      Pain Edu? --      Excl. in [place]? --     Constitutional: Alert and oriented. Sitting up in bed, in mild respiratory distress but able to speak in short sentences. Eyes: Conjunctivae are normal. PERRL. EOMI. Head: Atraumatic. Nose: No congestion/rhinnorhea. Mouth/Throat: Mucous membranes are moist.  Oropharynx non-erythematous. Neck: No stridor.   Cardiovascular: mildly tachycardic rate, regular rhythm. Grossly normal heart sounds.  Good peripheral circulation. Respiratory: Mild tachypnea with increased work of breathing. Globally diminished breath sounds with poor air movement, worse in the left lung fields. Gastrointestinal: Soft and nontender. No distention.  No CVA tenderness. Genitourinary: deferred Musculoskeletal: 1-2+ pitting edema bilateral lower extremities.  No joint effusions. Neurologic:  Normal speech and language. No gross focal neurologic deficits are appreciated. No gait instability. Skin:  Skin is warm, dry and intact. No rash noted. Psychiatric: Mood and affect  are normal. Speech and behavior are normal.  ____________________________________________   LABS (all labs ordered are listed, but only abnormal results are displayed)  Labs Reviewed  CBC - Abnormal; Notable for the following:    RBC 5.60 (*)    Platelets 142 (*)    All other components within normal limits  COMPREHENSIVE METABOLIC PANEL - Abnormal; Notable for the following:    Glucose, Bld 330 (*)     Alkaline Phosphatase 127 (*)    All other components within normal limits  BRAIN NATRIURETIC PEPTIDE - Abnormal; Notable for the following:    B Natriuretic Peptide 394.0 (*)    All other components within normal limits  CULTURE, BLOOD (ROUTINE X 2)  CULTURE, BLOOD (ROUTINE X 2)  TROPONIN I   ____________________________________________  EKG  ED ECG REPORT I, Joanne Gavel, the attending physician, personally viewed and interpreted this ECG.   Date: 07/17/2015  EKG Time: 14:32  Rate: 107  Rhythm: sinus tachycardia, occasional PVCs  Axis: normal  Intervals:none  ST&T Change: No acute ST elevation  ____________________________________________  RADIOLOGY  CXR IMPRESSION: Focal airspace disease in the lateral left lung base. Pneumonia would be a consideration. Consider follow-up to ensure complete resolution. ____________________________________________   PROCEDURES  Procedure(s) performed: None  Critical Care performed: No  ____________________________________________   INITIAL IMPRESSION / ASSESSMENT AND PLAN / ED COURSE  Pertinent labs & imaging results that were available during my care of the patient were reviewed by me and considered in my medical decision making (see chart for details).  Sarah White is a 69 y.o. female history of COPD, diabetes, coronary artery disease status post stents who presents for evaluation of gradual onset shortness of breath, worse with exertion and lying flat. On exam, she has increased work of breathing as well as global diminished breath sounds. Mild tachypnea. Labs reviewed. CBC unremarkable. CMP with mild nonspecific alkaline phosphatase elevation. Troponin negative. BNP is elevated at 394. Chest x-ray concerning for left-sided pneumonia however suspect pneumonia as well as mild COPD exacerbation. We'll give DuoNeb treatment. We'll give Centrax and Zithromax and anticipate admission. BNP elevated mild pitting edema of the  lateral lower extremities, suspect she may also be developing a mild CHF exacerbation but a chest x-ray shows no interstitial edema, will not give Lasix at this time.  ----------------------------------------- 6:27 PM on 07/17/2015 -----------------------------------------  Short of breath improved with own about this time however patient still intermittently to Make. She desaturates to 87% on room air and is requiring 2 L supplemental oxygen to maintain O2 sat of 96%. Case discussed with Dr. Margaretmary Eddy, hospitalist, for admission at this time. ____________________________________________   FINAL CLINICAL IMPRESSION(S) / ED DIAGNOSES  Final diagnoses:  SOB (shortness of breath)  Community acquired pneumonia      Joanne Gavel, MD 07/17/15 [phone]

## 2015-07-18 LAB — COMPREHENSIVE METABOLIC PANEL
ALK PHOS: 115 U/L (ref 38–126)
ALT: 42 U/L (ref 14–54)
AST: 20 U/L (ref 15–41)
Albumin: 3.4 g/dL — ABNORMAL LOW (ref 3.5–5.0)
Anion gap: 7 (ref 5–15)
BUN: 7 mg/dL (ref 6–20)
CALCIUM: 8.5 mg/dL — AB (ref 8.9–10.3)
CO2: 28 mmol/L (ref 22–32)
CREATININE: 0.49 mg/dL (ref 0.44–1.00)
Chloride: 101 mmol/L (ref 101–111)
GFR calc non Af Amer: >60 mL/min (ref >60)
Glucose, Bld: 250 mg/dL — ABNORMAL HIGH (ref 65–99)
Potassium: 3.5 mmol/L (ref 3.5–5.1)
SODIUM: 136 mmol/L (ref 135–145)
Total Bilirubin: 0.6 mg/dL (ref 0.3–1.2)
Total Protein: 6.3 g/dL — ABNORMAL LOW (ref 6.5–8.1)

## 2015-07-18 LAB — CBC
HCT: 44.3 % (ref 35.0–47.0)
Hemoglobin: 14.8 g/dL (ref 12.0–16.0)
MCH: 27.6 pg (ref 26.0–34.0)
MCHC: 33.3 g/dL (ref 32.0–36.0)
MCV: 82.9 fL (ref 80.0–100.0)
PLATELETS: 138 10*3/uL — AB (ref 150–440)
RBC: 5.35 MIL/uL — AB (ref 3.80–5.20)
RDW: 14.1 % (ref 11.5–14.5)
WBC: 8.1 10*3/uL (ref 3.6–11.0)

## 2015-07-18 LAB — HEMOGLOBIN A1C: HEMOGLOBIN A1C: 9.8 % — AB (ref 4.0–6.0)

## 2015-07-18 LAB — GLUCOSE, CAPILLARY
GLUCOSE-CAPILLARY: 254 mg/dL — AB (ref 65–99)
Glucose-Capillary: 234 mg/dL — ABNORMAL HIGH (ref 65–99)
Glucose-Capillary: 313 mg/dL — ABNORMAL HIGH (ref 65–99)
Glucose-Capillary: 209 mg/dL — ABNORMAL HIGH (ref 65–99)
Glucose-Capillary: 197 mg/dL — ABNORMAL HIGH (ref 65–99)

## 2015-07-18 LAB — TROPONIN I
Troponin I: 0.04 ng/mL — ABNORMAL HIGH (ref <0.031)
Troponin I: <0.03 ng/mL (ref <0.031)

## 2015-07-18 LAB — TSH: TSH: 1.939 u[IU]/mL (ref 0.350–4.500)

## 2015-07-18 LAB — STREP PNEUMONIAE URINARY ANTIGEN: Strep Pneumo Urinary Antigen: NEGATIVE

## 2015-07-18 MED ORDER — DOXYLAMINE SUCCINATE (SLEEP) 25 MG PO TABS: 25.0000 mg | ORAL_TABLET | Freq: Every evening | ORAL | Status: DC | PRN

## 2015-07-18 MED ORDER — INSULIN GLARGINE 100 UNIT/ML ~~LOC~~ SOLN
10.0000 [IU] | Freq: Every day | SUBCUTANEOUS | Status: DC
  Administered 2015-07-18 – 2015-07-19 (×2): 10 [IU] via SUBCUTANEOUS
  Filled 2015-07-18 (×3): qty 0.1

## 2015-07-18 MED ORDER — CETYLPYRIDINIUM CHLORIDE 0.05 % MT LIQD
7.0000 mL | Freq: Two times a day (BID) | OROMUCOSAL | Status: DC
  Administered 2015-07-18 – 2015-07-25 (×12): 7 mL via OROMUCOSAL

## 2015-07-18 MED ORDER — INSULIN ASPART 100 UNIT/ML ~~LOC~~ SOLN
0.0000 [IU] | Freq: Every day | SUBCUTANEOUS | Status: DC
  Administered 2015-07-20: 4 [IU] via SUBCUTANEOUS
  Administered 2015-07-21: 3 [IU] via SUBCUTANEOUS
  Filled 2015-07-18: qty 3
  Filled 2015-07-18: qty 4

## 2015-07-18 MED ORDER — DIPHENHYDRAMINE HCL 25 MG PO CAPS
25.0000 mg | ORAL_CAPSULE | Freq: Every evening | ORAL | Status: DC | PRN
  Administered 2015-07-18: 02:00:00 25 mg via ORAL
  Filled 2015-07-18: qty 1

## 2015-07-18 MED ORDER — INSULIN ASPART 100 UNIT/ML ~~LOC~~ SOLN
0.0000 [IU] | Freq: Three times a day (TID) | SUBCUTANEOUS | Status: DC
  Administered 2015-07-18: 17:00:00 5 [IU] via SUBCUTANEOUS
  Administered 2015-07-19: 3 [IU] via SUBCUTANEOUS
  Administered 2015-07-19: 2 [IU] via SUBCUTANEOUS
  Administered 2015-07-19: 12:00:00 11 [IU] via SUBCUTANEOUS
  Administered 2015-07-20: 17:00:00 3 [IU] via SUBCUTANEOUS
  Administered 2015-07-20: 5 [IU] via SUBCUTANEOUS
  Administered 2015-07-20 – 2015-07-21 (×2): 8 [IU] via SUBCUTANEOUS
  Administered 2015-07-21: 3 [IU] via SUBCUTANEOUS
  Administered 2015-07-21: 5 [IU] via SUBCUTANEOUS
  Administered 2015-07-22: 3 [IU] via SUBCUTANEOUS
  Filled 2015-07-18: qty 3
  Filled 2015-07-18: qty 2
  Filled 2015-07-18: qty 11
  Filled 2015-07-18: qty 5
  Filled 2015-07-18 (×2): qty 8
  Filled 2015-07-18: qty 3
  Filled 2015-07-18: qty 5
  Filled 2015-07-18: qty 3
  Filled 2015-07-18: qty 5
  Filled 2015-07-18: qty 3

## 2015-07-18 MED ORDER — ATORVASTATIN CALCIUM 20 MG PO TABS
40.0000 mg | ORAL_TABLET | Freq: Every day | ORAL | Status: DC
  Administered 2015-07-18 – 2015-07-25 (×8): 40 mg via ORAL
  Filled 2015-07-18 (×8): qty 2

## 2015-07-18 MED ORDER — AMLODIPINE BESYLATE 10 MG PO TABS
10.0000 mg | ORAL_TABLET | Freq: Every day | ORAL | Status: DC
  Administered 2015-07-19 – 2015-07-22 (×4): 10 mg via ORAL
  Filled 2015-07-18 (×4): qty 1

## 2015-07-18 MED ORDER — ZOLPIDEM TARTRATE 5 MG PO TABS
5.0000 mg | ORAL_TABLET | Freq: Every evening | ORAL | Status: DC | PRN
  Administered 2015-07-19 (×2): 5 mg via ORAL
  Filled 2015-07-18 (×2): qty 1

## 2015-07-18 NOTE — Progress Notes (Signed)
Inpatient Diabetes Program Recommendations  AACE/ADA: New Consensus Statement on Inpatient Glycemic Control (2015)  Target Ranges:  Prepandial:   less than 140 mg/dL      Peak postprandial:   less than 180 mg/dL (1-2 hours)      Critically ill patients:  140 - 180 mg/dL  Results for Sarah White, Sarah White (MRN [id number]) as of 07/18/2015 11:05  Ref. Range 07/17/2015 22:05 07/18/2015 07:20  Glucose-Capillary Latest Ref Range: 65-99 mg/dL 296 (H) 234 (H)   Results for Sarah White, Sarah White (MRN [id number]) as of 07/18/2015 11:05  Ref. Range 01/15/2014 16:08  Hemoglobin A1C Latest Ref Range: 4.2-6.3 % 10.8 (H)   Review of Glycemic Control  Diabetes history: DM2 Outpatient Diabetes medications: None listed Current orders for Inpatient glycemic control: Novolog 0-9 units TID with meals, Novolog 0-5 units HS  Inpatient Diabetes Program Recommendations: Insulin - Basal: Please consider ordering low dose basal insulin; recommend starting with Levemir 9 units QHS (based on 87.6 kg x 0.1 units). Correction (SSI): Please consider increasing Novolog correction to Moderate scale. HgbA1C: A1C is pending. Last A1C in the chart was 10.8% on 01/15/14 (during last hospital admission at which time pt was newly diagnosed with DM).  Thanks, Barnie Alderman, RN, MSN, CDE Diabetes Coordinator Inpatient Diabetes Program [phone] (Team Pager from [place] to [place]) [phone] ([facility] office) [phone] [facility] office) [phone] [facility] office)

## 2015-07-18 NOTE — Progress Notes (Signed)
Initial Nutrition Assessment   INTERVENTION:   Meals and Snacks: Cater to patient preferences Medical Food Supplement Therapy: will recommend on follow if intake poor   NUTRITION DIAGNOSIS:   Inadequate oral intake related to poor appetite as evidenced by per patient/family report.  GOAL:   Patient will meet greater than or equal to 90% of their needs  MONITOR:    (Energy Intake, Electrolyte and renal Profile, Anthropometrics, Digestive System, glucose Profile)  REASON FOR ASSESSMENT:   Malnutrition Screening Tool    ASSESSMENT:   Pt admitted with difficulty breathing secondary to community acquired pna.   Past Medical History  Diagnosis Date  . COPD (chronic obstructive pulmonary disease) ([place])   . Diabetes mellitus without complication ([place])   . Cancer [facility])     uterine    Diet Order:  Diet heart healthy/carb modified Room service appropriate?: Yes; Fluid consistency:: Thin   Current Nutrition: Pt reports eating very well this am as she was hungry   Food/Nutrition-Related History: Pt reports not eating much for the past few days PTA. Pt reports starting to eat meals but experiencing early satiety and foods tasting bland. Pt reports no supplements consumed PTA.   Scheduled Medications:  . [START ON 07/19/2015] amLODipine  10 mg Oral Daily  . antiseptic oral rinse  7 mL Mouth Rinse BID  . aspirin EC  81 mg Oral Daily  . azithromycin  500 mg Oral Q24H  . cefTRIAXone (ROCEPHIN)  IV  1 g Intravenous Q24H  . enoxaparin (LOVENOX) injection  40 mg Subcutaneous Q24H  . insulin aspart  0-15 Units Subcutaneous TID WC  . insulin aspart  0-5 Units Subcutaneous QHS  . ipratropium-albuterol  3 mL Nebulization Q6H  . metoprolol tartrate  25 mg Oral BID  . nicotine  14 mg Transdermal Daily     Electrolyte/Renal Profile and Glucose Profile:   Recent Labs Lab 07/17/15 1433 07/18/15 0703  NA 136 136  K 3.6 3.5  CL 101 101  CO2 26 28  BUN 9 7  CREATININE 0.70 0.49   CALCIUM 9.1 8.5*  GLUCOSE 330* 250*   Protein Profile:  Recent Labs Lab 07/17/15 1433 07/18/15 0703  ALBUMIN 3.9 3.4*    Gastrointestinal Profile: Last BM:  07/18/2015   Nutrition-Focused Physical Exam Findings: Nutrition-Focused physical exam completed. Findings are WDL for fat depletion, muscle depletion, and edema.    Weight Change: Pt reports weight loss of 10-15lbs in the past couple of months. Pt reports weight over 200lbs (5-7% weight loss in the past couple of months)   Height:   Ht Readings from Last 1 Encounters:  07/17/15 5' 4.5" (1.638 m)    Weight:   Wt Readings from Last 1 Encounters:  07/17/15 193 lb 3.2 oz (87.635 kg)     BMI:  Body mass index is 32.66 kg/(m^2).   EDUCATION NEEDS:   No education needs identified at this time   [place], [place], LDN Pager [phone]

## 2015-07-18 NOTE — Progress Notes (Signed)
[facility] at [place] NAME: Sarah White    MR#:  [id number]  DATE OF BIRTH:  03-04-1946  SUBJECTIVE:  CHIEF COMPLAINT:   Chief Complaint  Patient presents with  . Shortness of Breath  . Dizziness  still cough and SOB, on O2 [place] 3L.  REVIEW OF SYSTEMS:  CONSTITUTIONAL: No fever, has weakness.  EYES: No blurred or double vision.  EARS, NOSE, AND THROAT: No tinnitus or ear pain.  RESPIRATORY: has cough and shortness of breath, no wheezing or hemoptysis.  CARDIOVASCULAR: No chest pain, orthopnea, edema.  GASTROINTESTINAL: No nausea, vomiting, diarrhea or abdominal pain.  GENITOURINARY: No dysuria, hematuria.  ENDOCRINE: No polyuria, nocturia,  HEMATOLOGY: No anemia, easy bruising or bleeding SKIN: No rash or lesion. MUSCULOSKELETAL: No joint pain or arthritis.   NEUROLOGIC: No tingling, numbness, weakness.  PSYCHIATRY: No anxiety or depression.   DRUG ALLERGIES:  No Known Allergies  VITALS:  Blood pressure 137/61, pulse 86, temperature 98 F (36.7 C), temperature source Oral, resp. rate 20, height 5' 4.5" (1.638 m), weight 87.635 kg (193 lb 3.2 oz), SpO2 98 %.  PHYSICAL EXAMINATION:  GENERAL:  69 y.o.-year-old patient lying in the bed with no acute distress.  EYES: Pupils equal, round, reactive to light and accommodation. No scleral icterus. Extraocular muscles intact.  HEENT: Head atraumatic, normocephalic. Oropharynx and nasopharynx clear.  NECK:  Supple, no jugular venous distention. No thyroid enlargement, no tenderness.  LUNGS: Very diminished breath sounds bilaterally, no wheezing, rales,rhonchi or crepitation. No use of accessory muscles of respiration.  CARDIOVASCULAR: S1, S2 normal. No murmurs, rubs, or gallops.  ABDOMEN: Soft, nontender, nondistended. Bowel sounds present. No organomegaly or mass.  EXTREMITIES: No pedal edema, cyanosis, or clubbing.  NEUROLOGIC: Cranial nerves II through XII are intact. Muscle  strength 5/5 in all extremities. Sensation intact. Gait not checked.  PSYCHIATRIC: The patient is alert and oriented x 3.  SKIN: No obvious rash, lesion, or ulcer.    LABORATORY PANEL:   CBC  Recent Labs Lab 07/18/15 0703  WBC 8.1  HGB 14.8  HCT 44.3  PLT 138*   ------------------------------------------------------------------------------------------------------------------  Chemistries   Recent Labs Lab 07/18/15 0703  NA 136  K 3.5  CL 101  CO2 28  GLUCOSE 250*  BUN 7  CREATININE 0.49  CALCIUM 8.5*  AST 20  ALT 42  ALKPHOS 115  BILITOT 0.6   ------------------------------------------------------------------------------------------------------------------  Cardiac Enzymes  Recent Labs Lab 07/18/15 0703  TROPONINI <0.03   ------------------------------------------------------------------------------------------------------------------  RADIOLOGY:  Dg Chest 2 View  07/17/2015  CLINICAL DATA:  Initial encounter for increasing shortness of breath with chest tightness since yesterday. EXAM: CHEST  2 VIEW COMPARISON:  01/15/2014. FINDINGS: The lungs are clear without focal pneumonia, edema, no evidence for pulmonary edema. Airspace opacity in the left costophrenic sulcus on the PA film. The cardiopericardial silhouette is within normal limits for size. Imaged bony structures of the thorax are intact. IMPRESSION: Focal airspace disease in the lateral left lung base. Pneumonia would be a consideration. Consider follow-up to ensure complete resolution. Electronically Signed   By: Misty Stanley M.D.   On: 07/17/2015 17:15    EKG:   Orders placed or performed during the hospital encounter of 07/17/15  . ED EKG  . ED EKG  . EKG 12-Lead  . EKG 12-Lead    ASSESSMENT AND PLAN:   1. Community acquired pneumonia-left lower lobe with hypoxia continue IV Rocephin and azithromycin Follow up Sputum culture and sensitivity,  urine for Legionella antigen and streptococcal  antigen continue nebulizer treatments as needed. Try to wean off oxygen via nasal cannula.  2. Malignant hypertension. Better controlled. continue metoprolol and amlodipine. Hydralazine iv prn.  3. Diabetes mellitus-noncompliant with medications f/u hemoglobin A1c and increase to moderate sliding scale insulin, add lantus 10 units SQ HS.  4. History of coronary artery disease status post stents Patient is asymptomatic but not seen by any cardiologist in several years startedspirin 81 mg and low-dose beta blocker, start lipitor.   * HLP. Per lipid panel, start lipitor.  5. Tobacco abuse Counseled patient to quit smoking for 3-5 minutes. Provided nicotine patch.   6. Elevated alkaline phosphatase  repeated alkaline phosphatase is normal.  * COPD. Stable. Continue NEB.   All the records are reviewed and case discussed with Care Management/Social Workerr. Management plans discussed with the patient, family and they are in agreement. Greater than 50% time was spent on coordination of care and face-to-face counseling. CODE STATUS: DNR  TOTAL TIME TAKING CARE OF THIS PATIENT: 46 minutes.   POSSIBLE D/C IN 3 DAYS, DEPENDING ON CLINICAL CONDITION.   Demetrios Loll M.D on 07/18/2015 at 3:47 PM  Between 7am to 6pm - Pager - [phone]  After 6pm go to [email or web address] - password [email or web address] [facility]  [place] [facility]  Office  [phone]  CC: Primary care physician; No PCP Per Patient

## 2015-07-18 NOTE — Plan of Care (Signed)
Problem: Education: Goal: Knowledge of [place] [facility] Education information/materials will improve Outcome: Progressing Pt likes to be called Sarah White. Pt lives at home with son.  Pt will retire from work in two months.    Past Medical History   Diagnosis  Date   .  COPD (chronic obstructive pulmonary disease) ([place])     .  Diabetes mellitus without complication ([place])     .  Cancer [facility])         uterine          Pt stated that she only takes at home a baby aspirin and comvibent inhailer.      Problem: Activity: Goal: Risk for activity intolerance will decrease Outcome: Progressing Pt alert and oriented. Tylenol for headache with relief. Zofran for nausea with relief. Breathing treatments scheduled thru out this shift. On 3L of O2, sats in the mid to high 90's. Blood pressure elevated improved with scheduled metoprolol and amlodipine. Iv fluids and antibiotics. Continue to monitor.

## 2015-07-18 NOTE — Plan of Care (Signed)
Problem: Education: Goal: Knowledge of [place] [facility] Education information/materials will improve Outcome: Progressing Pt educated about moderate fall risk precautions, Lantus and novolog, handouts given. Pt verbalized understanding.  Problem: Safety: Goal: Ability to remain free from injury will improve Outcome: Progressing Moderate fall precautions maintained. Pt remained free of injury.  Problem: Pain Managment: Goal: General experience of comfort will improve Outcome: Progressing Pt denies pain.  Problem: Skin Integrity: Goal: Risk for impaired skin integrity will decrease Outcome: Progressing Skin WDL.  Problem: Activity: Goal: Risk for activity intolerance will decrease Outcome: Progressing VSS. Titrated pt to room air, on reassessment O2 sats 92% at rest. 1L O2 per  reapplied to keep O2 sats>94% per order. Pt ambulates independently and denies sob.

## 2015-07-19 LAB — LEGIONELLA PNEUMOPHILA SEROGP 1 UR AG: L. PNEUMOPHILA SEROGP 1 UR AG: NEGATIVE

## 2015-07-19 LAB — EXPECTORATED SPUTUM ASSESSMENT W REFEX TO RESP CULTURE

## 2015-07-19 LAB — GLUCOSE, CAPILLARY
GLUCOSE-CAPILLARY: 156 mg/dL — AB (ref 65–99)
GLUCOSE-CAPILLARY: 189 mg/dL — AB (ref 65–99)
Glucose-Capillary: 221 mg/dL — ABNORMAL HIGH (ref 65–99)
Glucose-Capillary: 305 mg/dL — ABNORMAL HIGH (ref 65–99)

## 2015-07-19 LAB — EXPECTORATED SPUTUM ASSESSMENT W GRAM STAIN, RFLX TO RESP C

## 2015-07-19 MED ORDER — LIVING WELL WITH DIABETES BOOK
Freq: Once | Status: AC
  Administered 2015-07-19: 11:00:00
  Filled 2015-07-19: qty 1

## 2015-07-19 MED ORDER — CEFUROXIME AXETIL 500 MG PO TABS
500.0000 mg | ORAL_TABLET | Freq: Two times a day (BID) | ORAL | Status: DC
  Administered 2015-07-19 – 2015-07-22 (×6): 500 mg via ORAL
  Filled 2015-07-19 (×9): qty 1

## 2015-07-19 MED ORDER — INSULIN GLARGINE 100 UNIT/ML ~~LOC~~ SOLN
15.0000 [IU] | Freq: Every day | SUBCUTANEOUS | Status: DC
  Administered 2015-07-20 – 2015-07-22 (×3): 15 [IU] via SUBCUTANEOUS
  Filled 2015-07-19 (×4): qty 0.15

## 2015-07-19 NOTE — Progress Notes (Addendum)
Inpatient Diabetes Program Recommendations  AACE/ADA: New Consensus Statement on Inpatient Glycemic Control (2015)  Target Ranges:  Prepandial:   less than 140 mg/dL      Peak postprandial:   less than 180 mg/dL (1-2 hours)      Critically ill patients:  140 - 180 mg/dL    Results for Sarah White, Sarah White (MRN [id number]) as of 07/19/2015 14:12  Ref. Range 07/18/2015 07:20 07/18/2015 11:15 07/18/2015 16:05 07/18/2015 17:35 07/18/2015 21:39  Glucose-Capillary Latest Ref Range: 65-99 mg/dL 234 (H) 313 (H) 209 (H) 254 (H) 197 (H)    Results for Sarah White, Sarah White (MRN [id number]) as of 07/19/2015 14:12  Ref. Range 07/19/2015 07:35 07/19/2015 11:00  Glucose-Capillary Latest Ref Range: 65-99 mg/dL 221 (H) 305 (H)     Admit with: Pneumonia  History: DM  Home DM Meds: None  Current Insulin Orders: Lantus 10 units daily      Novolog Moderate SSI (0-15 units) TID AC + HS    MD- Please consider the following in-hospital insulin adjustments:  1. Increase Lantus to 15 units daily  2. Start Novolog Meal Coverage- Novolog 4 units tid with meals  3. Patient will need DM medications for home.  Through conversation with patient today, patient told me she stopped taking Metformin over 1 year ago b/c it made her feel bad and gave her "wavy vision".  Per patient's words, "I threw that shit away- it made me see double and made me have wavy vision".  Does not want to take insulin at home.  Would be amenable to insulin after she retires from her job in January, however, patient stated she does not want to take insulin right now.  Asked patient if she would be willing to try another oral DM medication and she stated "yes" as long as it wasn't Metformin.  We could try starting patient on a DPP-4 inhibitor drug like Tradjenta 5 mg daily (low risk of hypoglycemia with this medication) or you could try patient on a traditional sulfonlyurea like Amaryl 4 mg daily.  I might be more apt to try Tradjenta over Amaryl as  patient is very fearful of Hypoglycemia at home.     -Encouraged patient to purchase a CBG meter OTC at [facility] (patient told the strips for her meter were too expensive with her insurance).  Asked patient to try to check her CBGs at least 1-2 times daily.  Reviewed CBG goals with patient and also reviewed her A1c results and her goal A1c.  Patient needs a PCP.  Have placed care management consult for assistance with this.    --Will follow patient during hospitalization--  Wyn Quaker RN, MSN, CDE Diabetes Coordinator Inpatient Glycemic Control Team Team Pager: [phone] (8a-5p)

## 2015-07-19 NOTE — Plan of Care (Signed)
Problem: Education: Goal: Knowledge of [place] [facility] Education information/materials will improve Outcome: Progressing Living well with diabetes  Booklet  Provided for pt.  Problem: Safety: Goal: Ability to remain free from injury will improve Outcome: Progressing Pt remains independent knows to call for assist if needed or  If  Dizziness.  Problem: Pain Managment: Goal: General experience of comfort will improve Outcome: Progressing Denies pain oob to chair.  Dr Bridgett Larsson saw and  Will try to wean 02  Some sob during the night better this am.  Problem: Skin Integrity: Goal: Risk for impaired skin integrity will decrease Outcome: Progressing No impaired skin issues  Problem: Activity: Goal: Risk for activity intolerance will decrease Outcome: Progressing oob . tol well

## 2015-07-19 NOTE — Plan of Care (Signed)
Problem: Safety: Goal: Ability to remain free from injury will improve Outcome: Progressing Pt remains free from falls and injury.  She is up ad lib with no issues.     Problem: Pain Managment: Goal: General experience of comfort will improve Outcome: Progressing Pt has had no complaints of pain, but O2 sats dropped to 89 during night.  O2 put to 1L and sats came back to 95.  Problem: Activity: Goal: Risk for activity intolerance will decrease Outcome: Progressing Pt continues to have SOB with exertion.  Monitor O2 as necessary.

## 2015-07-19 NOTE — Progress Notes (Signed)
[facility] at [place] NAME: Sarah White    MR#:  [id number]  DATE OF BIRTH:  05-18-1946  SUBJECTIVE:  CHIEF COMPLAINT:   Chief Complaint  Patient presents with  . Shortness of Breath  . Dizziness  still cough and SOB, on O2 [place] 3L. hypoxia on exertion last night.  REVIEW OF SYSTEMS:  CONSTITUTIONAL: No fever, has weakness.  EYES: No blurred or double vision.  EARS, NOSE, AND THROAT: No tinnitus or ear pain.  RESPIRATORY: has cough and shortness of breath, no wheezing or hemoptysis.  CARDIOVASCULAR: No chest pain, orthopnea, edema.  GASTROINTESTINAL: No nausea, vomiting, diarrhea or abdominal pain.  GENITOURINARY: No dysuria, hematuria.  ENDOCRINE: No polyuria, nocturia,  HEMATOLOGY: No anemia, easy bruising or bleeding SKIN: No rash or lesion. MUSCULOSKELETAL: No joint pain or arthritis.   NEUROLOGIC: No tingling, numbness, weakness.  PSYCHIATRY: No anxiety or depression.   DRUG ALLERGIES:  No Known Allergies  VITALS:  Blood pressure 118/51, pulse 92, temperature 98.3 F (36.8 C), temperature source Oral, resp. rate 19, height 5' 4.5" (1.638 m), weight 87.635 kg (193 lb 3.2 oz), SpO2 94 %.  PHYSICAL EXAMINATION:  GENERAL:  69 y.o.-year-old patient lying in the bed with no acute distress.  EYES: Pupils equal, round, reactive to light and accommodation. No scleral icterus. Extraocular muscles intact.  HEENT: Head atraumatic, normocephalic. Oropharynx and nasopharynx clear.  NECK:  Supple, no jugular venous distention. No thyroid enlargement, no tenderness.  LUNGS: Very diminished breath sounds bilaterally, no wheezing, rales,rhonchi or crepitation. No use of accessory muscles of respiration.  CARDIOVASCULAR: S1, S2 normal. No murmurs, rubs, or gallops.  ABDOMEN: Soft, nontender, nondistended. Bowel sounds present. No organomegaly or mass.  EXTREMITIES: No pedal edema, cyanosis, or clubbing.  NEUROLOGIC: Cranial nerves II  through XII are intact. Muscle strength 4-5/5 in all extremities. Sensation intact. Gait not checked.  PSYCHIATRIC: The patient is alert and oriented x 3.  SKIN: No obvious rash, lesion, or ulcer.    LABORATORY PANEL:   CBC  Recent Labs Lab 07/18/15 0703  WBC 8.1  HGB 14.8  HCT 44.3  PLT 138*   ------------------------------------------------------------------------------------------------------------------  Chemistries   Recent Labs Lab 07/18/15 0703  NA 136  K 3.5  CL 101  CO2 28  GLUCOSE 250*  BUN 7  CREATININE 0.49  CALCIUM 8.5*  AST 20  ALT 42  ALKPHOS 115  BILITOT 0.6   ------------------------------------------------------------------------------------------------------------------  Cardiac Enzymes  Recent Labs Lab 07/18/15 0703  TROPONINI <0.03   ------------------------------------------------------------------------------------------------------------------  RADIOLOGY:  Dg Chest 2 View  07/17/2015  CLINICAL DATA:  Initial encounter for increasing shortness of breath with chest tightness since yesterday. EXAM: CHEST  2 VIEW COMPARISON:  01/15/2014. FINDINGS: The lungs are clear without focal pneumonia, edema, no evidence for pulmonary edema. Airspace opacity in the left costophrenic sulcus on the PA film. The cardiopericardial silhouette is within normal limits for size. Imaged bony structures of the thorax are intact. IMPRESSION: Focal airspace disease in the lateral left lung base. Pneumonia would be a consideration. Consider follow-up to ensure complete resolution. Electronically Signed   By: Misty Stanley M.D.   On: 07/17/2015 17:15    EKG:   Orders placed or performed during the hospital encounter of 07/17/15  . ED EKG  . ED EKG  . EKG 12-Lead  . EKG 12-Lead    ASSESSMENT AND PLAN:   1. Community acquired pneumonia-left lower lobe with hypoxia continue IV Rocephin and azithromycin Follow  up Sputum culture and sensitivity, urine for  Legionella antigen and streptococcal antigen continue nebulizer treatments as needed. Try to wean off oxygen via nasal cannula.  2. Malignant hypertension. Better controlled. continue metoprolol and amlodipine. Hydralazine iv prn.  3. Diabetes mellitus-noncompliant with medications. Uncontrolled. BS 305 this am. Hemoglobin A1c 9.8,  increased to moderate sliding scale insulin, increased lantus to 15  units SQ HS. the patient received diabetes education. The diabetes educator suggests Tradjenta 5 mg daily after discharge.  4. History of coronary artery disease status post stents Patient is asymptomatic but not seen by any cardiologist in several years Started aspirin 81 mg and low-dose beta blocker, started lipitor.   * HLP. Per lipid panel, started lipitor.  5. Tobacco abuse Counseled patient to quit smoking for 3-5 minutes. on nicotine patch.   6. Elevated alkaline phosphatase  repeated alkaline phosphatase is normal.  * COPD. Stable. Continue NEB.   All the records are reviewed and case discussed with Care Management/Social Workerr. Management plans discussed with the patient, family and they are in agreement. Greater than 50% time was spent on coordination of care and face-to-face counseling. CODE STATUS: DNR  TOTAL TIME TAKING CARE OF THIS PATIENT: 37 minutes.   POSSIBLE D/C IN 3 DAYS, DEPENDING ON CLINICAL CONDITION.   Demetrios Loll M.D on 07/19/2015 at 2:28 PM  Between 7am to 6pm - Pager - [phone]  After 6pm go to [email or web address] - password [email or web address] [facility]  [place] [facility]  Office  [phone]  CC: Primary care physician; No PCP Per Patient

## 2015-07-20 LAB — GLUCOSE, CAPILLARY
GLUCOSE-CAPILLARY: 192 mg/dL — AB (ref 65–99)
Glucose-Capillary: 241 mg/dL — ABNORMAL HIGH (ref 65–99)
Glucose-Capillary: 264 mg/dL — ABNORMAL HIGH (ref 65–99)
Glucose-Capillary: 265 mg/dL — ABNORMAL HIGH (ref 65–99)
Glucose-Capillary: 314 mg/dL — ABNORMAL HIGH (ref 65–99)

## 2015-07-20 MED ORDER — PROMETHAZINE HCL 25 MG/ML IJ SOLN: 12.5000 mg | Freq: Once | INTRAMUSCULAR | Status: DC

## 2015-07-20 MED ORDER — PREDNISONE 50 MG PO TABS
50.0000 mg | ORAL_TABLET | Freq: Every day | ORAL | Status: DC
  Administered 2015-07-20 – 2015-07-22 (×3): 50 mg via ORAL
  Filled 2015-07-20 (×3): qty 1

## 2015-07-20 NOTE — Progress Notes (Signed)
[facility] at [place] NAME: Sarah White    MR#:  [id number]  DATE OF BIRTH:  [birth year]  SUBJECTIVE:  CHIEF COMPLAINT:   Chief Complaint  Patient presents with  . Shortness of Breath  . Dizziness  Still cough and SOB, on O2 [place] 3L. hypoxia on exertion.  REVIEW OF SYSTEMS:  CONSTITUTIONAL: No fever, has weakness.  EYES: No blurred or double vision.  EARS, NOSE, AND THROAT: No tinnitus or ear pain.  RESPIRATORY: has cough and shortness of breath, no wheezing or hemoptysis.  CARDIOVASCULAR: No chest pain, orthopnea, edema.  GASTROINTESTINAL: No nausea, vomiting, diarrhea or abdominal pain.  GENITOURINARY: No dysuria, hematuria.  ENDOCRINE: No polyuria, nocturia,  HEMATOLOGY: No anemia, easy bruising or bleeding SKIN: No rash or lesion. MUSCULOSKELETAL: No joint pain or arthritis.   NEUROLOGIC: No tingling, numbness, weakness.  PSYCHIATRY: No anxiety or depression.   DRUG ALLERGIES:  No Known Allergies  VITALS:  Blood pressure 136/59, pulse 91, temperature 98.3 F (36.8 C), temperature source Oral, resp. rate 18, height 5' 4.5" (1.638 m), weight 87.635 kg (193 lb 3.2 oz), SpO2 96 %.  PHYSICAL EXAMINATION:  GENERAL:  69 y.o.-year-old patient lying in the bed with no acute distress.  EYES: Pupils equal, round, reactive to light and accommodation. No scleral icterus. Extraocular muscles intact.  HEENT: Head atraumatic, normocephalic. Oropharynx and nasopharynx clear.  NECK:  Supple, no jugular venous distention. No thyroid enlargement, no tenderness.  LUNGS: Very diminished breath sounds bilaterally, no wheezing, rales,rhonchi or crepitation. No use of accessory muscles of respiration.  CARDIOVASCULAR: S1, S2 normal. No murmurs, rubs, or gallops.  ABDOMEN: Soft, nontender, nondistended. Bowel sounds present. No organomegaly or mass.  EXTREMITIES: No pedal edema, cyanosis, or clubbing.  NEUROLOGIC: Cranial nerves II through XII  are intact. Muscle strength 4-5/5 in all extremities. Sensation intact. Gait not checked.  PSYCHIATRIC: The patient is alert and oriented x 3.  SKIN: No obvious rash, lesion, or ulcer.    LABORATORY PANEL:   CBC  Recent Labs Lab 07/18/15 0703  WBC 8.1  HGB 14.8  HCT 44.3  PLT 138*   ------------------------------------------------------------------------------------------------------------------  Chemistries   Recent Labs Lab 07/18/15 0703  NA 136  K 3.5  CL 101  CO2 28  GLUCOSE 250*  BUN 7  CREATININE 0.49  CALCIUM 8.5*  AST 20  ALT 42  ALKPHOS 115  BILITOT 0.6   ------------------------------------------------------------------------------------------------------------------  Cardiac Enzymes  Recent Labs Lab 07/18/15 0703  TROPONINI <0.03   ------------------------------------------------------------------------------------------------------------------  RADIOLOGY:  No results found.  EKG:   Orders placed or performed during the hospital encounter of 07/17/15  . ED EKG  . ED EKG  . EKG 12-Lead  . EKG 12-Lead    ASSESSMENT AND PLAN:   # Community acquired pneumonia-left lower lobe with hypoxia continue IV Rocephin and azithromycin Follow up Sputum culture and sensitivity, urine for Legionella antigen and streptococcal antigen continue nebulizer treatments as needed. Try to wean off oxygen via nasal cannula.  # Acute hypoxic resp failure  # Malignant hypertension. Better controlled. continue metoprolol and amlodipine. Hydralazine iv prn.  # Diabetes mellitus-noncompliant with medications. Uncontrolled. BS 305 this am. Hemoglobin A1c 9.8,  increased to moderate sliding scale insulin, increased lantus to 15  units SQ HS. the patient received diabetes education. The diabetes educator suggests Tradjenta 5 mg daily after discharge.  # History of coronary artery disease status post stents Patient is asymptomatic but not seen by any cardiologist  in several years Started aspirin 81 mg and low-dose beta blocker, started lipitor.  # HLP. Per lipid panel, started lipitor.  # Tobacco abuse Counseled patient to quit smoking  # Elevated alkaline phosphatase  repeated alkaline phosphatase is normal.  # COPD. Stable. Continue NEB.   All the records are reviewed and case discussed with Care Management/Social Workerr. Management plans discussed with the patient, family and they are in agreement.  CODE STATUS: DNR  TOTAL TIME TAKING CARE OF THIS PATIENT: 35 minutes.   POSSIBLE D/C IN 3 DAYS, DEPENDING ON CLINICAL CONDITION.   Hillary Bow R M.D on 07/20/2015 at 2:37 PM  Between 7am to 6pm - Pager - [phone]  After 6pm go to [email or web address] - password [email or web address] [facility]  [place] [facility]  Office  [phone]  CC: Primary care physician; No PCP Per Patient

## 2015-07-20 NOTE — Plan of Care (Signed)
Problem: Education: Goal: Knowledge of [facility] Education information/materials will improve Outcome: Progressing Patient education provided concerning moderate fall risk. Pt verbalized understanding to call for assistance when needed.     Problem: Safety: Goal: Ability to remain free from injury will improve Outcome: Progressing Moderate fall risk. Pt has remained free of injury or fall this shift.     Problem: Pain Managment: Goal: General experience of comfort will improve Outcome: Progressing Pt rested well most of shift. Up early this morning nauseated Zofran given; ineffective. MD notified and new order for Phenergan 12.5mg  IV once, after verification from pharmacy pt was back to bed asleep, new order NOT GIVEN. Pt resting, condition improved.      Problem: Skin Integrity: Goal: Risk for impaired skin integrity will decrease Outcome: Progressing Pt A&O, ambulatory, able to turn self in bed.     Problem: Activity: Goal: Risk for activity intolerance will decrease Outcome: Progressing Pt has no c/o shortness of breath, remains on 1LPM, O2 sats 96-99%.

## 2015-07-20 NOTE — Plan of Care (Signed)
Problem: Safety: Goal: Ability to remain free from injury will improve Outcome: Progressing Moderate fall precautions maintained. Pt remained free of injury during the shift.  Problem: Pain Managment: Goal: General experience of comfort will improve Outcome: Progressing Pt denies pain/n/v.  Problem: Skin Integrity: Goal: Risk for impaired skin integrity will decrease Outcome: Progressing Skin intact. Pt ambulates independently. Good appetite.  Problem: Activity: Goal: Risk for activity intolerance will decrease Outcome: Progressing O2 sats dropped to 85% on exertion on RA with PT. 1L O2 continues with O2 sats in the mid 90's. Prednisone initiated. VSS.

## 2015-07-20 NOTE — Evaluation (Signed)
Physical Therapy Evaluation Patient Details Name: Sarah White MRN: [id number] DOB: 11-12-1945 Today's Date: 07/20/2015   History of Present Illness  pt presents with hx of CAD with stents, COPD cane to ER 11/16 with c/o SOB. pt reporting today she is feeling better and is anxious to get back to work which does require quite a bit of walking.   Clinical Impression  Pt presents reporting feeling better regarding her SOB with her hospital stay. Pt demonstrated independent transfers, ambulation, and adequate extremity strength. Pt ambulated without O2 or assist needed today x 218ft total. She was able to maintain O2 saturation 92-94% x 147ft, but did desaturate to 85% during the remaining 112ft. She was able to recover to 92% without supplemental O2 with pt education of pursed lip breathing after about 90s. Pt was left with O2 flow 1L via nasal cannula at PT exit. Pt is not in need of further PT services at this time. She was encouraged to walk independently if cleared by nursing or with as she is not a fall risk and is independent with mobility.     Follow Up Recommendations No PT follow up    Equipment Recommendations       Recommendations for Other Services  (outpatient pulmonary rehab)     Precautions / Restrictions Precautions Precautions: None Restrictions Weight Bearing Restrictions: No      Mobility  Bed Mobility               General bed mobility comments: pt in chair at PT enter/exit  Transfers Overall transfer level: Independent               General transfer comment: no difficulty   Ambulation/Gait Ambulation/Gait assistance: Independent Ambulation Distance (Feet): 250 Feet   Gait Pattern/deviations: WFL(Within Functional Limits)   Gait velocity interpretation: >2.62 ft/sec, indicative of independent community ambulator General Gait Details: pt demonstrated safe, reciprocal gait pattern. pt O2 remained stable 92-94% during initial 162ft. pt did  desaturate to 85% during the last 153ft. pt O2 recovered to 92% after approx 90s of pursed lip breathing without supplimental O2.   Stairs            Wheelchair Mobility    Modified Rankin (Stroke Patients Only)       Balance Overall balance assessment: Independent                                           Pertinent Vitals/Pain Pain Assessment: No/denies pain    Home Living Family/patient expects to be discharged to:: Private residence Living Arrangements: Children (son) Available Help at Discharge: Family Type of Home: House Home Access: Stairs to enter Entrance Stairs-Rails: Right Entrance Stairs-Number of Steps: 4 Home Layout: One level        Prior Function Level of Independence: Independent         Comments: no difficulties     Hand Dominance        Extremity/Trunk Assessment   Upper Extremity Assessment: Overall WFL for tasks assessed           Lower Extremity Assessment: Overall WFL for tasks assessed (hip flexion 4/5 MMT, other muscle groups 5/5)         Communication   Communication: No difficulties  Cognition Arousal/Alertness: Awake/alert Behavior During Therapy: WFL for tasks assessed/performed Overall Cognitive Status: Within Functional Limits for tasks assessed  General Comments      Exercises        Assessment/Plan    PT Assessment Patent does not need any further PT services  PT Diagnosis Generalized weakness   PT Problem List    PT Treatment Interventions     PT Goals (Current goals can be found in the Care Plan section) Acute Rehab PT Goals Patient Stated Goal: go back to work  PT Goal Formulation: With patient Time For Goal Achievement: 08/03/15 Potential to Achieve Goals: Good    Frequency     Barriers to discharge        Co-evaluation               End of Session   Activity Tolerance: Patient tolerated treatment well (desaturated O2 to 85% after  ~132ft ) Patient left: in chair           Time: 1005-1020 PT Time Calculation (min) (ACUTE ONLY): 15 min   Charges:   PT Evaluation $Initial PT Evaluation Tier I: 1 Procedure     PT G Codes:       Sarah White, PT, DPT [phone]  Sarah White 07/20/2015, 10:34 AM

## 2015-07-21 LAB — CBC WITH DIFFERENTIAL/PLATELET
BASOS ABS: 0 10*3/uL (ref 0–0.1)
Basophils Relative: 0 %
Eosinophils Absolute: 0 10*3/uL (ref 0–0.7)
Eosinophils Relative: 0 %
HEMATOCRIT: 38.3 % (ref 35.0–47.0)
HEMOGLOBIN: 12.7 g/dL (ref 12.0–16.0)
LYMPHS PCT: 11 %
Lymphs Abs: 0.8 10*3/uL — ABNORMAL LOW (ref 1.0–3.6)
MCH: 27.5 pg (ref 26.0–34.0)
MCHC: 33.2 g/dL (ref 32.0–36.0)
MCV: 82.7 fL (ref 80.0–100.0)
MONO ABS: 0.4 10*3/uL (ref 0.2–0.9)
MONOS PCT: 6 %
NEUTROS ABS: 6.1 10*3/uL (ref 1.4–6.5)
NEUTROS PCT: 83 %
Platelets: 128 10*3/uL — ABNORMAL LOW (ref 150–440)
RBC: 4.63 MIL/uL (ref 3.80–5.20)
RDW: 14.3 % (ref 11.5–14.5)
WBC: 7.3 10*3/uL (ref 3.6–11.0)

## 2015-07-21 LAB — CREATININE, SERUM: Creatinine, Ser: 0.51 mg/dL (ref 0.44–1.00)

## 2015-07-21 LAB — GLUCOSE, CAPILLARY
GLUCOSE-CAPILLARY: 288 mg/dL — AB (ref 65–99)
GLUCOSE-CAPILLARY: 234 mg/dL — AB (ref 65–99)
GLUCOSE-CAPILLARY: 275 mg/dL — AB (ref 65–99)
Glucose-Capillary: 191 mg/dL — ABNORMAL HIGH (ref 65–99)

## 2015-07-21 MED ORDER — ALPRAZOLAM 0.25 MG PO TABS
0.2500 mg | ORAL_TABLET | ORAL | Status: DC | PRN
  Administered 2015-07-21 – 2015-07-24 (×6): 0.25 mg via ORAL
  Filled 2015-07-21 (×6): qty 1

## 2015-07-21 NOTE — Progress Notes (Signed)
Patient had no c/o pain this shift VSS Patient had no c/o aniexty Patient ambulated to chair throughout shift Patient received flutter valve and used it several times late in shift

## 2015-07-21 NOTE — Plan of Care (Signed)
Problem: Education: Goal: Knowledge of [facility] Education information/materials will improve Outcome: Progressing Patient education provided concerning moderate fall risk. Pt verbalized understanding to call for assistance when needed.     Problem: Safety: Goal: Ability to remain free from injury will improve Outcome: Progressing Moderate fall risk. Pt has remained free of injury or fall this shift.  Problem: Pain Managment: Goal: General experience of comfort will improve Outcome: Progressing Pt rested well most of shift, no c/o pain. C/o anxiety r/t PO steroid, MD notified, new order obtained and given with good effect noted.  Problem: Skin Integrity: Goal: Risk for impaired skin integrity will decrease Outcome: Progressing Pt A&O, ambulatory, able to turn self in bed.  Problem: Activity: Goal: Risk for activity intolerance will decrease Outcome: Progressing Pt has no c/o shortness of breath, remains on 1LPM, O2 sats 94%.

## 2015-07-21 NOTE — Progress Notes (Signed)
[facility] at [place] NAME: Sarah White    MR#:  [id number]  DATE OF BIRTH:  [birth year]  SUBJECTIVE:  CHIEF COMPLAINT:   Chief Complaint  Patient presents with  . Shortness of Breath  . Dizziness  Continues to have dry cough and SOB. Still needing 2 L O2.  Desats easily on minimal ambulation  REVIEW OF SYSTEMS:  CONSTITUTIONAL: No fever, has weakness.  EYES: No blurred or double vision.  EARS, NOSE, AND THROAT: No tinnitus or ear pain.  RESPIRATORY: has cough and shortness of breath, no wheezing or hemoptysis.  CARDIOVASCULAR: No chest pain, orthopnea, edema.  GASTROINTESTINAL: No nausea, vomiting, diarrhea or abdominal pain.  GENITOURINARY: No dysuria, hematuria.  ENDOCRINE: No polyuria, nocturia,  HEMATOLOGY: No anemia, easy bruising or bleeding SKIN: No rash or lesion. MUSCULOSKELETAL: No joint pain or arthritis.   NEUROLOGIC: No tingling, numbness, weakness.  PSYCHIATRY: No anxiety or depression.   DRUG ALLERGIES:  No Known Allergies  VITALS:  Blood pressure 128/54, pulse 90, temperature 97.8 F (36.6 C), temperature source Oral, resp. rate 18, height 5' 4.5" (1.638 m), weight 87.635 kg (193 lb 3.2 oz), SpO2 95 %.  PHYSICAL EXAMINATION:  GENERAL:  69 y.o.-year-old patient lying in the bed with no acute distress.  EYES: Pupils equal, round, reactive to light and accommodation. No scleral icterus. Extraocular muscles intact.  HEENT: Head atraumatic, normocephalic. Oropharynx and nasopharynx clear.  NECK:  Supple, no jugular venous distention. No thyroid enlargement, no tenderness.  LUNGS: Very diminished breath sounds bilaterally, no wheezing, rales,rhonchi or crepitation. No use of accessory muscles of respiration.  CARDIOVASCULAR: S1, S2 normal. No murmurs, rubs, or gallops.  ABDOMEN: Soft, nontender, nondistended. Bowel sounds present. No organomegaly or mass.  EXTREMITIES: No pedal edema, cyanosis, or clubbing.   NEUROLOGIC: Cranial nerves II through XII are intact. Muscle strength 4-5/5 in all extremities. Sensation intact. Gait not checked.  PSYCHIATRIC: The patient is alert and oriented x 3.  SKIN: No obvious rash, lesion, or ulcer.    LABORATORY PANEL:   CBC  Recent Labs Lab 07/21/15 0447  WBC 7.3  HGB 12.7  HCT 38.3  PLT 128*   ------------------------------------------------------------------------------------------------------------------  Chemistries   Recent Labs Lab 07/18/15 0703 07/21/15 0447  NA 136  --   K 3.5  --   CL 101  --   CO2 28  --   GLUCOSE 250*  --   BUN 7  --   CREATININE 0.49 0.51  CALCIUM 8.5*  --   AST 20  --   ALT 42  --   ALKPHOS 115  --   BILITOT 0.6  --    ------------------------------------------------------------------------------------------------------------------  Cardiac Enzymes  Recent Labs Lab 07/18/15 0703  TROPONINI <0.03   ------------------------------------------------------------------------------------------------------------------  RADIOLOGY:  No results found.  EKG:   Orders placed or performed during the hospital encounter of 07/17/15  . ED EKG  . ED EKG  . EKG 12-Lead  . EKG 12-Lead    ASSESSMENT AND PLAN:   # Community acquired pneumonia-left lower lobe with hypoxia continue IV Rocephin and azithromycin Follow up Sputum culture and sensitivity, urine for Legionella antigen and streptococcal antigen continue nebulizer treatments as needed. Try to wean off oxygen via nasal cannula. Add flutter valve  # Acute hypoxic resp failure  # Malignant hypertension. Better controlled. continue metoprolol and amlodipine. Hydralazine iv prn.  # Diabetes mellitus-noncompliant with medications. Uncontrolled. BS 305 this am. Hemoglobin A1c 9.8,  increased to moderate  sliding scale insulin, increased lantus to 15  units SQ HS. the patient received diabetes education. The diabetes educator suggests Tradjenta 5 mg  daily after discharge.  # History of coronary artery disease status post stents Patient is asymptomatic but not seen by any cardiologist in several years Started aspirin 81 mg and low-dose beta blocker, started lipitor.  # HLP. Per lipid panel, started lipitor.  # Tobacco abuse Counseled patient to quit smoking  # Elevated alkaline phosphatase  repeated alkaline phosphatase is normal.  # COPD. Stable. Continue NEB.   All the records are reviewed and case discussed with Care Management/Social Workerr. Management plans discussed with the patient, family and they are in agreement.  CODE STATUS: DNR  TOTAL TIME TAKING CARE OF THIS PATIENT: 35 minutes.   POSSIBLE TOMORROW WITH HOME OXYGEN, DEPENDING ON CLINICAL CONDITION.   Hillary Bow R M.D on 07/21/2015 at 11:53 AM  Between 7am to 6pm - Pager - [phone]  After 6pm go to [email or web address] - password [email or web address] [facility]  [facility]  Office  [phone]  CC: Primary care physician; No PCP Per Patient

## 2015-07-22 ENCOUNTER — Inpatient Hospital Stay: Admit: 2015-07-22 | Payer: BLUE CROSS/BLUE SHIELD

## 2015-07-22 ENCOUNTER — Inpatient Hospital Stay
Admit: 2015-07-22 | Discharge: 2015-07-22 | Disposition: A | Discharge status: Home / Self Care | Payer: BLUE CROSS/BLUE SHIELD | Attending: Internal Medicine | Admitting: Internal Medicine

## 2015-07-22 ENCOUNTER — Inpatient Hospital Stay: Payer: BLUE CROSS/BLUE SHIELD

## 2015-07-22 DIAGNOSIS — I2510 Atherosclerotic heart disease of native coronary artery without angina pectoris: Secondary | ICD-10-CM

## 2015-07-22 DIAGNOSIS — G9340 Encephalopathy, unspecified: Secondary | ICD-10-CM

## 2015-07-22 DIAGNOSIS — J811 Chronic pulmonary edema: Secondary | ICD-10-CM | POA: Diagnosis not present

## 2015-07-22 DIAGNOSIS — J9602 Acute respiratory failure with hypercapnia: Secondary | ICD-10-CM

## 2015-07-22 DIAGNOSIS — F17200 Nicotine dependence, unspecified, uncomplicated: Secondary | ICD-10-CM | POA: Diagnosis present

## 2015-07-22 DIAGNOSIS — E1165 Type 2 diabetes mellitus with hyperglycemia: Secondary | ICD-10-CM | POA: Insufficient documentation

## 2015-07-22 DIAGNOSIS — J810 Acute pulmonary edema: Secondary | ICD-10-CM

## 2015-07-22 DIAGNOSIS — E1122 Type 2 diabetes mellitus with diabetic chronic kidney disease: Secondary | ICD-10-CM

## 2015-07-22 DIAGNOSIS — J9601 Acute respiratory failure with hypoxia: Secondary | ICD-10-CM | POA: Diagnosis present

## 2015-07-22 DIAGNOSIS — J449 Chronic obstructive pulmonary disease, unspecified: Secondary | ICD-10-CM

## 2015-07-22 HISTORY — DX: Encephalopathy, unspecified: G93.40

## 2015-07-22 HISTORY — DX: Acute respiratory failure with hypoxia: J96.01

## 2015-07-22 HISTORY — DX: Chronic pulmonary edema: J81.1

## 2015-07-22 LAB — BLOOD GAS, ARTERIAL
ACID-BASE EXCESS: 2.9 mmol/L (ref 0.0–3.0)
Allens test (pass/fail): POSITIVE — AB
BICARBONATE: 34.8 meq/L — AB (ref 21.0–28.0)
FIO2: 100
O2 SAT: 87.1 %
PCO2 ART: 89 mmHg — AB (ref 32.0–48.0)
PH ART: 7.2 — AB (ref 7.350–7.450)
PO2 ART: 65 mmHg — AB (ref 83.0–108.0)
Patient temperature: 37

## 2015-07-22 LAB — CBC WITH DIFFERENTIAL/PLATELET
BASOS ABS: 0.1 10*3/uL (ref 0–0.1)
Basophils Relative: 1 %
Eosinophils Absolute: 0 10*3/uL (ref 0–0.7)
Eosinophils Relative: 0 %
HEMATOCRIT: 46.3 % (ref 35.0–47.0)
HEMOGLOBIN: 14.8 g/dL (ref 12.0–16.0)
LYMPHS PCT: 4 %
Lymphs Abs: 0.5 10*3/uL — ABNORMAL LOW (ref 1.0–3.6)
MCH: 26.8 pg (ref 26.0–34.0)
MCHC: 31.9 g/dL — ABNORMAL LOW (ref 32.0–36.0)
MCV: 84.1 fL (ref 80.0–100.0)
MONO ABS: 0.2 10*3/uL (ref 0.2–0.9)
Monocytes Relative: 2 %
NEUTROS ABS: 11.5 10*3/uL — AB (ref 1.4–6.5)
NEUTROS PCT: 93 %
Platelets: 185 10*3/uL (ref 150–440)
RBC: 5.51 MIL/uL — AB (ref 3.80–5.20)
RDW: 14.6 % — ABNORMAL HIGH (ref 11.5–14.5)
WBC: 12.3 10*3/uL — ABNORMAL HIGH (ref 3.6–11.0)

## 2015-07-22 LAB — BASIC METABOLIC PANEL
Anion gap: 9 (ref 5–15)
Anion gap: 4 — ABNORMAL LOW (ref 5–15)
BUN: 15 mg/dL (ref 6–20)
BUN: 15 mg/dL (ref 6–20)
CHLORIDE: 100 mmol/L — AB (ref 101–111)
CO2: 28 mmol/L (ref 22–32)
CO2: 32 mmol/L (ref 22–32)
CREATININE: 0.63 mg/dL (ref 0.44–1.00)
Calcium: 8.4 mg/dL — ABNORMAL LOW (ref 8.9–10.3)
Calcium: 8.8 mg/dL — ABNORMAL LOW (ref 8.9–10.3)
Chloride: 92 mmol/L — ABNORMAL LOW (ref 101–111)
Creatinine, Ser: 0.66 mg/dL (ref 0.44–1.00)
GFR calc Af Amer: >60 mL/min (ref >60)
GFR calc Af Amer: >60 mL/min (ref >60)
GFR calc non Af Amer: >60 mL/min (ref >60)
Glucose, Bld: 362 mg/dL — ABNORMAL HIGH (ref 65–99)
Glucose, Bld: 357 mg/dL — ABNORMAL HIGH (ref 65–99)
POTASSIUM: 4.2 mmol/L (ref 3.5–5.1)
POTASSIUM: 4.6 mmol/L (ref 3.5–5.1)
SODIUM: 136 mmol/L (ref 135–145)
Sodium: 129 mmol/L — ABNORMAL LOW (ref 135–145)

## 2015-07-22 LAB — GLUCOSE, CAPILLARY
GLUCOSE-CAPILLARY: 156 mg/dL — AB (ref 65–99)
GLUCOSE-CAPILLARY: 300 mg/dL — AB (ref 65–99)
GLUCOSE-CAPILLARY: 308 mg/dL — AB (ref 65–99)
GLUCOSE-CAPILLARY: 111 mg/dL — AB (ref 65–99)

## 2015-07-22 LAB — TROPONIN I: Troponin I: 0.03 ng/mL (ref <0.031)

## 2015-07-22 LAB — MRSA PCR SCREENING: MRSA by PCR: NEGATIVE

## 2015-07-22 LAB — BRAIN NATRIURETIC PEPTIDE: B NATRIURETIC PEPTIDE 5: 235 pg/mL — AB (ref 0.0–100.0)

## 2015-07-22 LAB — PROCALCITONIN

## 2015-07-22 MED ORDER — FUROSEMIDE 10 MG/ML IJ SOLN
INTRAMUSCULAR | Status: AC
  Filled 2015-07-22: qty 2

## 2015-07-22 MED ORDER — PREDNISONE 50 MG PO TABS: 50.0000 mg | ORAL_TABLET | Freq: Every day | ORAL | Status: DC

## 2015-07-22 MED ORDER — IPRATROPIUM-ALBUTEROL 0.5-2.5 (3) MG/3ML IN SOLN
3.0000 mL | Freq: Four times a day (QID) | RESPIRATORY_TRACT | Status: DC
  Administered 2015-07-22 – 2015-07-23 (×3): 3 mL via RESPIRATORY_TRACT
  Filled 2015-07-22 (×4): qty 3

## 2015-07-22 MED ORDER — ALBUTEROL SULFATE (2.5 MG/3ML) 0.083% IN NEBU
2.5000 mg | INHALATION_SOLUTION | RESPIRATORY_TRACT | Status: DC | PRN
  Administered 2015-07-23: 2.5 mg via RESPIRATORY_TRACT
  Filled 2015-07-22: qty 3

## 2015-07-22 MED ORDER — BUDESONIDE-FORMOTEROL FUMARATE 80-4.5 MCG/ACT IN AERO: 2.0000 | INHALATION_SPRAY | Freq: Two times a day (BID) | RESPIRATORY_TRACT | Status: DC

## 2015-07-22 MED ORDER — INSULIN ASPART 100 UNIT/ML ~~LOC~~ SOLN
0.0000 [IU] | SUBCUTANEOUS | Status: DC
  Administered 2015-07-22 (×2): 11 [IU] via SUBCUTANEOUS
  Filled 2015-07-22 (×2): qty 11

## 2015-07-22 MED ORDER — INSULIN ASPART 100 UNIT/ML ~~LOC~~ SOLN
0.0000 [IU] | Freq: Three times a day (TID) | SUBCUTANEOUS | Status: DC
  Administered 2015-07-23: 2 [IU] via SUBCUTANEOUS
  Administered 2015-07-23 (×3): 5 [IU] via SUBCUTANEOUS
  Administered 2015-07-24: 3 [IU] via SUBCUTANEOUS
  Administered 2015-07-24: 2 [IU] via SUBCUTANEOUS
  Administered 2015-07-24 (×2): 8 [IU] via SUBCUTANEOUS
  Administered 2015-07-25: 5 [IU] via SUBCUTANEOUS
  Administered 2015-07-25 (×2): 3 [IU] via SUBCUTANEOUS
  Filled 2015-07-22: qty 2
  Filled 2015-07-22: qty 5
  Filled 2015-07-22: qty 3
  Filled 2015-07-22: qty 8
  Filled 2015-07-22 (×2): qty 5
  Filled 2015-07-22: qty 2
  Filled 2015-07-22: qty 5
  Filled 2015-07-22: qty 3
  Filled 2015-07-22: qty 8
  Filled 2015-07-22: qty 3

## 2015-07-22 MED ORDER — CEFTRIAXONE SODIUM 1 G IJ SOLR
1.0000 g | INTRAMUSCULAR | Status: DC
Start: 2015-07-22 — End: 2015-07-24
  Administered 2015-07-22 – 2015-07-24 (×3): 1 g via INTRAVENOUS
  Filled 2015-07-22 (×3): qty 10

## 2015-07-22 MED ORDER — BUDESONIDE 0.25 MG/2ML IN SUSP
0.2500 mg | Freq: Four times a day (QID) | RESPIRATORY_TRACT | Status: DC
  Administered 2015-07-22 – 2015-07-23 (×4): 0.25 mg via RESPIRATORY_TRACT
  Filled 2015-07-22 (×3): qty 2

## 2015-07-22 MED ORDER — LORAZEPAM 2 MG/ML IJ SOLN
0.5000 mg | INTRAMUSCULAR | Status: AC
  Administered 2015-07-22: 12:00:00 0.5 mg via INTRAVENOUS

## 2015-07-22 MED ORDER — LORAZEPAM 2 MG/ML IJ SOLN
0.5000 mg | Freq: Once | INTRAMUSCULAR | Status: AC
  Administered 2015-07-22: 0.5 mg via INTRAVENOUS

## 2015-07-22 MED ORDER — GLIPIZIDE 10 MG PO TABS: 10.0000 mg | ORAL_TABLET | Freq: Two times a day (BID) | ORAL | Status: DC

## 2015-07-22 MED ORDER — FUROSEMIDE 10 MG/ML IJ SOLN
60.0000 mg | INTRAMUSCULAR | Status: AC
  Administered 2015-07-22: 60 mg via INTRAVENOUS

## 2015-07-22 MED ORDER — FUROSEMIDE 10 MG/ML IJ SOLN
40.0000 mg | Freq: Three times a day (TID) | INTRAMUSCULAR | Status: DC
  Administered 2015-07-22 – 2015-07-24 (×5): 40 mg via INTRAVENOUS
  Filled 2015-07-22 (×5): qty 4

## 2015-07-22 MED ORDER — LORAZEPAM 2 MG/ML IJ SOLN
INTRAMUSCULAR | Status: AC
  Filled 2015-07-22: qty 1

## 2015-07-22 MED ORDER — TIOTROPIUM BROMIDE MONOHYDRATE 18 MCG IN CAPS: 18.0000 ug | ORAL_CAPSULE | Freq: Every day | RESPIRATORY_TRACT | Status: DC

## 2015-07-22 MED ORDER — AMLODIPINE BESYLATE 10 MG PO TABS: 10.0000 mg | ORAL_TABLET | Freq: Every day | ORAL | Status: DC

## 2015-07-22 MED ORDER — LISINOPRIL 20 MG PO TABS: 20.0000 mg | ORAL_TABLET | Freq: Every day | ORAL | Status: DC

## 2015-07-22 MED ORDER — FUROSEMIDE 10 MG/ML IJ SOLN
INTRAMUSCULAR | Status: AC
  Filled 2015-07-22: qty 4

## 2015-07-22 MED ORDER — ALBUTEROL SULFATE HFA 108 (90 BASE) MCG/ACT IN AERS: 2.0000 | INHALATION_SPRAY | Freq: Four times a day (QID) | RESPIRATORY_TRACT | Status: DC | PRN

## 2015-07-22 MED ORDER — ALPRAZOLAM 0.5 MG PO TABS: 0.2500 mg | ORAL_TABLET | Freq: Every evening | ORAL | Status: DC | PRN

## 2015-07-22 MED ORDER — LEVOFLOXACIN 500 MG PO TABS: 500.0000 mg | ORAL_TABLET | Freq: Every day | ORAL | Status: DC

## 2015-07-22 NOTE — Progress Notes (Signed)
[place] at [place] NAME: Sarah White    MR#:  [id number]  DATE OF BIRTH:  1946-05-19  SUBJECTIVE:  CHIEF COMPLAINT:   Chief Complaint  Patient presents with  . Shortness of Breath  . Dizziness   Still has SOB/cough on ambulating. Plan was to discharge patient home with home O2. But on ambulating patient desated into low 80s and felt extremely SOB. O2 increased to 6L O2. On re eval patient said she almost passed out walking.  REVIEW OF SYSTEMS:  CONSTITUTIONAL: No fever, has weakness.  EYES: No blurred or double vision.  EARS, NOSE, AND THROAT: No tinnitus or ear pain.  RESPIRATORY: has cough and shortness of breath, no wheezing or hemoptysis.  CARDIOVASCULAR: No chest pain, orthopnea, edema.  GASTROINTESTINAL: No nausea, vomiting, diarrhea or abdominal pain.  GENITOURINARY: No dysuria, hematuria.  ENDOCRINE: No polyuria, nocturia,  HEMATOLOGY: No anemia, easy bruising or bleeding SKIN: No rash or lesion. MUSCULOSKELETAL: No joint pain or arthritis.   NEUROLOGIC: No tingling, numbness, weakness.  PSYCHIATRY: No anxiety or depression.   DRUG ALLERGIES:  No Known Allergies  VITALS:  Blood pressure 151/67, pulse 86, temperature 97.5 F (36.4 C), temperature source Oral, resp. rate 22, height 5' 4.5" (1.638 m), weight 87.635 kg (193 lb 3.2 oz), SpO2 90 %.  PHYSICAL EXAMINATION:  GENERAL:  69 y.o.-year-old patient lying in the bed with no acute distress.  EYES: Pupils equal, round, reactive to light and accommodation. No scleral icterus. Extraocular muscles intact.  HEENT: Head atraumatic, normocephalic. Oropharynx and nasopharynx clear.  NECK:  Supple, no jugular venous distention. No thyroid enlargement, no tenderness.  LUNGS: Very diminished breath sounds bilaterally, no wheezing, rales,rhonchi or crepitation.  CARDIOVASCULAR: S1, S2 normal. No murmurs, rubs, or gallops.  ABDOMEN: Soft, nontender, nondistended. Bowel  sounds present. No organomegaly or mass.  EXTREMITIES: No pedal edema, cyanosis, or clubbing.  NEUROLOGIC: Cranial nerves II through XII are intact. Muscle strength 4-5/5 in all extremities. Sensation intact. Gait not checked.  PSYCHIATRIC: The patient is alert and oriented x 3.  SKIN: No obvious rash, lesion, or ulcer.    LABORATORY PANEL:   CBC  Recent Labs Lab 07/21/15 0447  WBC 7.3  HGB 12.7  HCT 38.3  PLT 128*   ------------------------------------------------------------------------------------------------------------------  Chemistries   Recent Labs Lab 07/18/15 0703 07/21/15 0447  NA 136  --   K 3.5  --   CL 101  --   CO2 28  --   GLUCOSE 250*  --   BUN 7  --   CREATININE 0.49 0.51  CALCIUM 8.5*  --   AST 20  --   ALT 42  --   ALKPHOS 115  --   BILITOT 0.6  --    ------------------------------------------------------------------------------------------------------------------  Cardiac Enzymes  Recent Labs Lab 07/18/15 0703  TROPONINI <0.03   ------------------------------------------------------------------------------------------------------------------  RADIOLOGY:  No results found.  EKG:   Orders placed or performed during the hospital encounter of 07/17/15  . ED EKG  . ED EKG  . EKG 12-Lead  . EKG 12-Lead    ASSESSMENT AND PLAN:   # Community acquired pneumonia-left lower lobe with hypoxia - Worsening resp failure Citrobacter and Klebsiella continue Azithromycin and cefuroxime  urine for Legionella antigen and streptococcal antigen are negative. continue nebulizer treatments as needed. Try to wean off oxygen via nasal cannula. Added flutter valve  # Acute hypoxic resp failure - worsening IV ativan STAT. Neb. Repeat CXR.  # Malignant  hypertension. Better controlled. continue amlodipine. Hydralazine iv prn. Stop metoprolol. Start lisinopril.  # Diabetes mellitus-noncompliant with medications. Uncontrolled. Due to steroids On  Lantus. SSI. ADA  # History of coronary artery disease status post stents Patient is asymptomatic but not seen by any cardiologist in several years Started aspirin 81 mg and low-dose beta blocker, started lipitor.  # HLP. Per lipid panel, started lipitor.  # Tobacco abuse Counseled patient to quit smoking  # Elevated alkaline phosphatase  repeated alkaline phosphatase is normal.  # COPD. Stable. Continue NEB.   All the records are reviewed and case discussed with Care Management/Social Workerr. Management plans discussed with the patient, family and they are in agreement.  CODE STATUS: DNR  TOTAL CRITICAL CARE TIME TAKING CARE OF THIS PATIENT: 40 minutes.     Hillary Bow R M.D on 07/22/2015 at 11:38 AM  Between 7am to 6pm - Pager - [phone]  After 6pm go to [email or web address] - password [email or web address] [facility]  [place] [facility]  Office  [phone]  CC: Primary care physician; No PCP Per Patient

## 2015-07-22 NOTE — Progress Notes (Addendum)
Pt c/o anxiety. Xanax given 1x. alert and c/o anxiety still. Desatting in 70s on 6L [place], put on nonrebreather. O2 sats increased but patient still c/o chest tightness. Breathing treatment given without improvement. Patient on tele now. Patient very anxious, yelling at times, MD notified. 0.5mg  Ativan given 2x throughout the anxious spells. ABG done. Patient extremely SOB and uncooperative with keeping O2 mask on. Waiting on CXR. Pt now requiring bipap. MD aware. Plan to transfer up to stepdown.  Report given to Amy, RN. Pt transported to stepdown.

## 2015-07-22 NOTE — Progress Notes (Addendum)
Patient planned to be discharged this am. Pt walked around unit without oxygen. Dropped to 83% on room air and was very SOB on exertion. Sat patient down in a chair to rest, O2 applied. Pt satting 94% on 4L [place]. Discharge cancelled. Repeat CXR done.

## 2015-07-22 NOTE — Discharge Instructions (Signed)
°  DIET:  Cardiac diet and Diabetic diet  DISCHARGE CONDITION:  Stable  ACTIVITY:  Activity as tolerated  OXYGEN:  Home Oxygen: Yes.     Oxygen Delivery: 2 liters/min via Patient connected to nasal cannula oxygen  DISCHARGE LOCATION:  home   If you experience worsening of your admission symptoms, develop shortness of breath, life threatening emergency, suicidal or homicidal thoughts you must seek medical attention immediately by calling 911 or calling your MD immediately  if symptoms less severe.  You Must read complete instructions/literature along with all the possible adverse reactions/side effects for all the Medicines you take and that have been prescribed to you. Take any new Medicines after you have completely understood and accpet all the possible adverse reactions/side effects.   Please note  You were cared for by a hospitalist during your hospital stay. If you have any questions about your discharge medications or the care you received while you were in the hospital after you are discharged, you can call the unit and asked to speak with the hospitalist on call if the hospitalist that took care of you is not available. Once you are discharged, your primary care physician will handle any further medical issues. Please note that NO REFILLS for any discharge medications will be authorized once you are discharged, as it is imperative that you return to your primary care physician (or establish a relationship with a primary care physician if you do not have one) for your aftercare needs so that they can reassess your need for medications and monitor your lab values.  QUIT SMOKING

## 2015-07-22 NOTE — Progress Notes (Signed)
*  PRELIMINARY RESULTS* Echocardiogram 2D Echocardiogram has been performed.  Sarah White 07/22/2015, 4:40 PM

## 2015-07-22 NOTE — Plan of Care (Signed)
Problem: Education: Goal: Knowledge of [facility] Education information/materials will improve Outcome: Progressing Patient education provided concerning moderate fall risk. Pt verbalized understanding to call for assistance when needed.  Problem: Safety: Goal: Ability to remain free from injury will improve Outcome: Progressing Moderate fall risk. Pt has remained free of injury or fall this shift.  Problem: Pain Managment: Goal: General experience of comfort will improve Outcome: Progressing Pt rested well most of shift, no c/o pain. C/o anxiety r/t PO steroid, PRN xanax given with good effect noted.  Problem: Skin Integrity: Goal: Risk for impaired skin integrity will decrease Outcome: Progressing Pt A&O, ambulatory, able to turn self in bed.  Problem: Activity: Goal: Risk for activity intolerance will decrease Outcome: Progressing Pt has no c/o shortness of breath, remains on 1LPM, O2 sats 94-96%.

## 2015-07-22 NOTE — Progress Notes (Signed)
Patient worsened with her breathing and needing Bipap. Ativan given as she was anxious and combative. ABG showed  Ph 7.2 Pco2 - 89  CXR showing pulm edema. Lasix 60mg  IV given  Discussed with Dr. Leonidas Romberg and patient being transferred to ICU.  Called and updated son who is HCPOA and has said it is ok to Intubate patient.

## 2015-07-22 NOTE — Consult Note (Addendum)
PULMONARY/CCM CONSULT NOTE  Requesting MD/Service: Sadie Haber Date of admission: 11/16 Date of consult: 11/21 Reason for consultation: acute resp failure  Pt Profile:  6 F smoker admitted 11/16 with worsening dyspnea and a dx of CAP. On the day of transfer to ICU she was ambulating in preparation for possible discharge and developed abrupt onset of hypoxic respiratory failure with severe agitation. She was given 2 small doses of lorazepam, started on BiPAP and transferred to ICU. ABG revealed acute respiratory acidosis. CXR was most c/w acute pulmonary edema.    HPI:  History as above. Pt unable tp provide further history due to acute AMS. She had requested to be DNR on admission but the foundation of this request is unclear and Dr Darvin Neighbours spoke with pt's son to reverse this for possible short term intubation  Past Medical History  Diagnosis Date  . COPD (chronic obstructive pulmonary disease) ([place])   . Diabetes mellitus without complication ([place])   . Cancer [facility])     uterine  CAD  MEDICATIONS: home and hosp meds reviewed  Social History   Social History  . Marital Status: Divorced    Spouse Name: N/A  . Number of Children: N/A  . Years of Education: N/A   Occupational History  . Not on file.   Social History Main Topics  . Smoking status: Current Every Day Smoker -- 0.50 packs/day    Types: Cigarettes  . Smokeless tobacco: Not on file  . Alcohol Use: No  . Drug Use: No  . Sexual Activity: Not on file   Other Topics Concern  . Not on file   Social History Narrative  . No narrative on file    No family history on file.  ROS - Unable to obtain  Filed Vitals:   07/22/15 1136 07/22/15 1150 07/22/15 1200 07/22/15 1220  BP:    153/94  Pulse:  117  116  Temp:      TempSrc:      Resp:  28    Height:      Weight:      SpO2: 90% 83% 100%     EXAM:  Gen: RASS -3, not F/C HEENT: edentulous, otherwise WNL Neck: JVP cannot be visualized. No LAN Lungs: diminished BS  throughout, no wheezes, bibasilar crackles Cardiovascular: reg, no M Abdomen: obese, soft, NT, +BS Ext: bilateral symmetric 1+ pretibial edema Neuro: MAEs, CNs intact   DATA:  BMP Latest Ref Rng 07/21/2015 07/18/2015 07/17/2015  Glucose 65 - 99 mg/dL - 250(H) 330(H)  BUN 6 - 20 mg/dL - 7 9  Creatinine 0.44 - 1.00 mg/dL 0.51 0.49 0.70  Sodium 135 - 145 mmol/L - 136 136  Potassium 3.5 - 5.1 mmol/L - 3.5 3.6  Chloride 101 - 111 mmol/L - 101 101  CO2 22 - 32 mmol/L - 28 26  Calcium 8.9 - 10.3 mg/dL - 8.5(L) 9.1    CBC Latest Ref Rng 07/21/2015 07/18/2015 07/17/2015  WBC 3.6 - 11.0 K/uL 7.3 8.1 8.1  Hemoglobin 12.0 - 16.0 g/dL 12.7 14.8 15.2  Hematocrit 35.0 - 47.0 % 38.3 44.3 46.1  Platelets 150 - 440 K/uL 128(L) 138(L) 142(L)    CXR: acute edema pattern  EKG: NSR, no acute ischemic changes   IMPRESSION:   1) Acute respiratory failure with hypoxia and hypercarbia  2) Pulmonary edema 3) COPD - no acute bronchospasm 4) Recent community acquired pneumonia 5) Smoker 6) Encephalopathy acute due to lorazepam and hypercarbia 7) DM2 (diabetes mellitus, type 2) ([place])  PLAN/REC:  ICU transfer NPPV Close monitoring to assess need for intubation Lasix X 1 ordered Scheduled nebulized steroids and bronchodilators Cycle cardiac markers Echocardiogram ordered Check BNP, PCT NPO until cognition improves  Changed abx to ceftriaxone  Changed SSI to q 4 hrs  Merton Border, MD PCCM service Mobile [phone] Pager [phone]

## 2015-07-23 ENCOUNTER — Inpatient Hospital Stay: Payer: BLUE CROSS/BLUE SHIELD

## 2015-07-23 ENCOUNTER — Ambulatory Visit: Payer: Self-pay | Admitting: Family Medicine

## 2015-07-23 DIAGNOSIS — J9601 Acute respiratory failure with hypoxia: Secondary | ICD-10-CM

## 2015-07-23 DIAGNOSIS — J9602 Acute respiratory failure with hypercapnia: Secondary | ICD-10-CM

## 2015-07-23 LAB — CULTURE, RESPIRATORY W GRAM STAIN

## 2015-07-23 LAB — GLUCOSE, CAPILLARY
GLUCOSE-CAPILLARY: 147 mg/dL — AB (ref 65–99)
GLUCOSE-CAPILLARY: 205 mg/dL — AB (ref 65–99)
GLUCOSE-CAPILLARY: 241 mg/dL — AB (ref 65–99)
Glucose-Capillary: 213 mg/dL — ABNORMAL HIGH (ref 65–99)
Glucose-Capillary: 250 mg/dL — ABNORMAL HIGH (ref 65–99)

## 2015-07-23 LAB — CULTURE, RESPIRATORY

## 2015-07-23 LAB — TROPONIN I: Troponin I: <0.03 ng/mL (ref <0.031)

## 2015-07-23 MED ORDER — CARVEDILOL 12.5 MG PO TABS
12.5000 mg | ORAL_TABLET | Freq: Two times a day (BID) | ORAL | Status: DC
  Administered 2015-07-23 – 2015-07-25 (×5): 12.5 mg via ORAL
  Filled 2015-07-23 (×5): qty 1

## 2015-07-23 MED ORDER — BUDESONIDE-FORMOTEROL FUMARATE 160-4.5 MCG/ACT IN AERO
2.0000 | INHALATION_SPRAY | Freq: Two times a day (BID) | RESPIRATORY_TRACT | Status: DC
  Administered 2015-07-23 – 2015-07-25 (×4): 2 via RESPIRATORY_TRACT
  Filled 2015-07-23: qty 6

## 2015-07-23 MED ORDER — TIOTROPIUM BROMIDE MONOHYDRATE 18 MCG IN CAPS
18.0000 ug | ORAL_CAPSULE | Freq: Every day | RESPIRATORY_TRACT | Status: DC
  Administered 2015-07-23 – 2015-07-25 (×3): 18 ug via RESPIRATORY_TRACT
  Filled 2015-07-23: qty 5

## 2015-07-23 MED ORDER — LISINOPRIL 20 MG PO TABS
20.0000 mg | ORAL_TABLET | Freq: Every day | ORAL | Status: DC
  Administered 2015-07-23 – 2015-07-25 (×3): 20 mg via ORAL
  Filled 2015-07-23 (×3): qty 1

## 2015-07-23 NOTE — Progress Notes (Signed)
[place] at [place] NAME: Sarah White    MR#:  [id number]  DATE OF BIRTH:  November 17, 1945  SUBJECTIVE:  CHIEF COMPLAINT:   Chief Complaint  Patient presents with  . Shortness of Breath  . Dizziness   Admitted for Pneumonia/COPD exacerbation. Worsened 11/21 prior to discharge and transferred to ICU on Bipap with New CHF. On lasix. Now on 2 L O2. SOB better. Edema resolved.   REVIEW OF SYSTEMS:  CONSTITUTIONAL: No fever, has weakness.  EYES: No blurred or double vision.  EARS, NOSE, AND THROAT: No tinnitus or ear pain.  RESPIRATORY: has cough and shortness of breath, no wheezing or hemoptysis.  CARDIOVASCULAR: No chest pain, orthopnea, edema.  GASTROINTESTINAL: No nausea, vomiting, diarrhea or abdominal pain.  GENITOURINARY: No dysuria, hematuria.  ENDOCRINE: No polyuria, nocturia,  HEMATOLOGY: No anemia, easy bruising or bleeding SKIN: No rash or lesion. MUSCULOSKELETAL: No joint pain or arthritis.   NEUROLOGIC: No tingling, numbness, weakness.  PSYCHIATRY: No anxiety or depression.   DRUG ALLERGIES:  No Known Allergies  VITALS:  Blood pressure 148/77, pulse 98, temperature 98.7 F (37.1 C), temperature source Oral, resp. rate 24, height 5\' 6"  (1.676 m), weight 91.5 kg (201 lb 11.5 oz), SpO2 95 %.  PHYSICAL EXAMINATION:  GENERAL:  69 y.o.-year-old patient lying in the bed with no acute distress.  EYES: Pupils equal, round, reactive to light and accommodation. No scleral icterus. Extraocular muscles intact.  HEENT: Head atraumatic, normocephalic. Oropharynx and nasopharynx clear.  NECK:  Supple, no jugular venous distention. No thyroid enlargement, no tenderness.  LUNGS:  no wheezing, rales,rhonchi or crepitation.  CARDIOVASCULAR: S1, S2 normal. No murmurs, rubs, or gallops.  ABDOMEN: Soft, nontender, nondistended. Bowel sounds present. No organomegaly or mass.  EXTREMITIES: No pedal edema, cyanosis, or clubbing.   NEUROLOGIC: Cranial nerves II through XII are intact. Muscle strength 4-5/5 in all extremities. Sensation intact. Gait not checked.  PSYCHIATRIC: The patient is alert and oriented x 3.  SKIN: No obvious rash, lesion, or ulcer.    LABORATORY PANEL:   CBC  Recent Labs Lab 07/22/15 1316  WBC 12.3*  HGB 14.8  HCT 46.3  PLT 185   ------------------------------------------------------------------------------------------------------------------  Chemistries   Recent Labs Lab 07/18/15 0703  07/22/15 2016  NA 136  < > 136  K 3.5  < > 4.6  CL 101  < > 100*  CO2 28  < > 32  GLUCOSE 250*  < > 357*  BUN 7  < > 15  CREATININE 0.49  < > 0.63  CALCIUM 8.5*  < > 8.8*  AST 20  --   --   ALT 42  --   --   ALKPHOS 115  --   --   BILITOT 0.6  --   --   < > = values in this interval not displayed. ------------------------------------------------------------------------------------------------------------------  Cardiac Enzymes  Recent Labs Lab 07/23/15 [id number]  TROPONINI <0.03   ------------------------------------------------------------------------------------------------------------------  RADIOLOGY:  Dg Chest Port 1 View  07/23/2015  CLINICAL DATA:  Respiratory failure EXAM: PORTABLE CHEST 1 VIEW COMPARISON:  Yesterday FINDINGS: Improved pulmonary edema. There is still hazy appearance of the lower chest, likely atelectasis and pleural fluid. Stable heart size and mediastinal contours. Background interstitial coarsening and hyperinflation consistent with patient's known COPD. No pneumothorax. IMPRESSION: 1. Improved CHF. 2. Background COPD. Electronically Signed   By: Monte Fantasia M.D.   On: 07/23/2015 07:29   Dg Chest Port 1 View  07/22/2015  CLINICAL DATA:  69 year old female with shortness breath. Chest tightness. EXAM: PORTABLE CHEST 1 VIEW COMPARISON:  Chest x-ray 07/17/2015. FINDINGS: Bibasilar opacities may reflect areas of atelectasis and/or consolidation. Moderate  bilateral pleural effusions. There is cephalization of the pulmonary vasculature, indistinctness of the interstitial markings, and patchy airspace disease throughout the lungs bilaterally suggestive of moderate pulmonary edema. Mild cardiomegaly. Upper mediastinal contours are distorted by patient's rotation to the left. Atherosclerosis in the thoracic aorta. IMPRESSION: 1. The appearance the chest suggests congestive heart failure, as above. 2. Atherosclerosis. Electronically Signed   By: Vinnie Langton M.D.   On: 07/22/2015 12:40    EKG:   Orders placed or performed during the hospital encounter of 07/17/15  . ED EKG  . ED EKG  . EKG 12-Lead  . EKG 12-Lead  . EKG 12-Lead  . EKG 12-Lead    ASSESSMENT AND PLAN:   # Community acquired pneumonia-left lower lobe with hypoxia  Citrobacter and Klebsiella continue abx  urine for Legionella antigen and streptococcal antigen are negative. continue nebulizer treatments as needed. Try to wean off oxygen via nasal cannula. flutter valve  # Acute congestive heart failure Likely has chronic chf due to untreated HTN/DM Systolic vs diastolic. Echo pending. On IV lasix and improving. ON ACEi. Start coreg  # Acute hypoxic resp failure - Improved  # Malignant hypertension. Better controlled. continue amlodipine. Hydralazine iv prn. Stop metoprolol. Start lisinopril.  # Diabetes mellitus-noncompliant with medications. Uncontrolled. Due to steroids On Lantus. SSI. ADA  # History of coronary artery disease status post stents Patient is asymptomatic but not seen by any cardiologist in several years Started aspirin 81 mg and low-dose beta blocker, started lipitor.  # HLP. Per lipid panel, started lipitor.  # Tobacco abuse Counseled patient to quit smoking  # Elevated alkaline phosphatase  repeated alkaline phosphatase is normal.  # COPD. Stable. Continue NEB.  All the records are reviewed and case discussed with Care  Management/Social Workerr. Management plans discussed with the patient, family and they are in agreement.  CODE STATUS: DNR  TOTAL TIME TAKING CARE OF THIS PATIENT: 40 minutes.     Hillary Bow R M.D on 07/23/2015 at 10:21 AM  Between 7am to 6pm - Pager - [phone]  After 6pm go to [email or web address] - password [email or web address] [facility]  [place] [facility]  Office  [phone]  CC: Primary care physician; No PCP Per Patient

## 2015-07-23 NOTE — Care Management Note (Signed)
Case Management Note  Patient Details  Name: AARALYNN BULLER MRN: [id number] Date of Birth: 07/08/1946  Subjective/Objective:  Admitted with PNA, COPD exacerbation and new onset CHF. Attempted to evaluate patient and she is on the bedside commode.                   Action/Plan: Will follow up and provide home health preference list and assess needs.   Expected Discharge Date:                  Expected Discharge Plan:  [place]  In-House Referral:     Discharge planning Services  CM Consult  Post Acute Care Choice:  Home Health Choice offered to:     DME Arranged:    DME Agency:     HH Arranged:    [place] Agency:     Status of Service:  In process, will continue to follow  Medicare Important Message Given:    Date Medicare IM Given:    Medicare IM give by:    Date Additional Medicare IM Given:    Additional Medicare Important Message give by:     If discussed at [place] of Stay Meetings, dates discussed:    Additional Comments:  Jolly Mango, RN 07/23/2015, 1:21 PM

## 2015-07-23 NOTE — Progress Notes (Signed)
Report called to [facility] and patient now being moved to room 245. No changes noted from previous assessments. Personal belongings, chart, and medications sent with patient to new room. Orderly transporting patient via bed.

## 2015-07-23 NOTE — Progress Notes (Signed)
Much improved No distress No new complaints  Filed Vitals:   07/23/15 1300 07/23/15 1400 07/23/15 1500 07/23/15 1523  BP: 128/48 121/66 121/64 141/55  Pulse: 89 85 83 81  Temp:    97.8 F (36.6 C)  TempSrc:    Oral  Resp: 13 20 20 17   Height:      Weight:      SpO2: 97% 93% 94% 92%   NAD HENT WNL No JVD noted Diminished BS, no wheezes, minimal bibasilar crackles Reg, no M NABS No LE edema  CBC Latest Ref Rng 07/22/2015 07/21/2015 07/18/2015  WBC 3.6 - 11.0 K/uL 12.3(H) 7.3 8.1  Hemoglobin 12.0 - 16.0 g/dL 14.8 12.7 14.8  Hematocrit 35.0 - 47.0 % 46.3 38.3 44.3  Platelets 150 - 440 K/uL 185 128(L) 138(L)    BMP Latest Ref Rng 07/22/2015 07/22/2015 07/21/2015  Glucose 65 - 99 mg/dL 357(H) 362(H) -  BUN 6 - 20 mg/dL 15 15 -  Creatinine 0.44 - 1.00 mg/dL 0.63 0.66 0.51  Sodium 135 - 145 mmol/L 136 129(L) -  Potassium 3.5 - 5.1 mmol/L 4.6 4.2 -  Chloride 101 - 111 mmol/L 100(L) 92(L) -  CO2 22 - 32 mmol/L 32 28 -  Calcium 8.9 - 10.3 mg/dL 8.8(L) 8.4(L) -    CXR: improved B infiltrates  IMPRESSION: Acute hypoxic/hypercarbic respiratory failure - resolving Pulm edema - resolving. Likely exacerbated by severe hypertension Resolving CAP Chronic resp failure due to COPD Smoker  PLAN/REC: Agree with transfer to telemetry Cont diuresis as permitted by BP and renal function Mgmt of hypertension per primary team Complete 7 days abx I have changed COPD regimen to Symbicort and Spiriva  She should be discharged on these meds and PRN albuterol Assess O2 needs prior to discharge I have encouraged that she follow up with me in pulmonary clinic after discharge.   I will arrange appt  PCCM will sign off. Please call if we can be of further assistance  Merton Border, MD PCCM service Mobile [phone] Pager [phone]

## 2015-07-24 ENCOUNTER — Telehealth: Payer: Self-pay | Admitting: *Deleted

## 2015-07-24 LAB — CULTURE, BLOOD (ROUTINE X 2)
Culture: NO GROWTH
Culture: NO GROWTH
Culture: NO GROWTH
Culture: NO GROWTH

## 2015-07-24 LAB — BASIC METABOLIC PANEL
Anion gap: 9 (ref 5–15)
BUN: 16 mg/dL (ref 6–20)
CALCIUM: 8.6 mg/dL — AB (ref 8.9–10.3)
CO2: 33 mmol/L — AB (ref 22–32)
Chloride: 96 mmol/L — ABNORMAL LOW (ref 101–111)
Creatinine, Ser: 0.53 mg/dL (ref 0.44–1.00)
GLUCOSE: 175 mg/dL — AB (ref 65–99)
POTASSIUM: 3.7 mmol/L (ref 3.5–5.1)
Sodium: 138 mmol/L (ref 135–145)

## 2015-07-24 LAB — GLUCOSE, CAPILLARY
GLUCOSE-CAPILLARY: 321 mg/dL — AB (ref 65–99)
GLUCOSE-CAPILLARY: 266 mg/dL — AB (ref 65–99)
GLUCOSE-CAPILLARY: 148 mg/dL — AB (ref 65–99)
Glucose-Capillary: 171 mg/dL — ABNORMAL HIGH (ref 65–99)

## 2015-07-24 MED ORDER — ONDANSETRON HCL 4 MG/2ML IJ SOLN
4.0000 mg | INTRAMUSCULAR | Status: DC | PRN
  Administered 2015-07-24: 4 mg via INTRAVENOUS
  Filled 2015-07-24: qty 2

## 2015-07-24 MED ORDER — SENNA 8.6 MG PO TABS: 1.0000 | ORAL_TABLET | Freq: Every day | ORAL | Status: DC | PRN

## 2015-07-24 MED ORDER — LIDOCAINE 5 % EX PTCH
1.0000 | MEDICATED_PATCH | CUTANEOUS | Status: DC
  Administered 2015-07-24 – 2015-07-25 (×2): 1 via TRANSDERMAL
  Filled 2015-07-24 (×2): qty 1

## 2015-07-24 MED ORDER — DOCUSATE SODIUM 100 MG PO CAPS
100.0000 mg | ORAL_CAPSULE | Freq: Two times a day (BID) | ORAL | Status: DC
  Administered 2015-07-24 – 2015-07-25 (×3): 100 mg via ORAL
  Filled 2015-07-24 (×3): qty 1

## 2015-07-24 MED ORDER — POLYETHYLENE GLYCOL 3350 17 G PO PACK
17.0000 g | PACK | Freq: Every day | ORAL | Status: DC
  Administered 2015-07-24 – 2015-07-25 (×2): 17 g via ORAL
  Filled 2015-07-24 (×2): qty 1

## 2015-07-24 MED ORDER — FLEET ENEMA 7-19 GM/118ML RE ENEM
1.0000 | ENEMA | Freq: Every day | RECTAL | Status: DC | PRN
Start: 2015-07-24 — End: 2015-07-25
  Administered 2015-07-24: 1 via RECTAL
  Filled 2015-07-24: qty 1

## 2015-07-24 MED ORDER — FUROSEMIDE 40 MG PO TABS
40.0000 mg | ORAL_TABLET | Freq: Every day | ORAL | Status: DC
  Administered 2015-07-25: 40 mg via ORAL
  Filled 2015-07-24: qty 1

## 2015-07-24 NOTE — Progress Notes (Signed)
[place] at [place] NAME: Sarah White    MR#:  [id number]  DATE OF BIRTH:  06-05-1946  SUBJECTIVE:  CHIEF COMPLAINT:   Chief Complaint  Patient presents with  . Shortness of Breath  . Dizziness   Admitted for Pneumonia/COPD exacerbation. Worsened 11/21 prior to discharge and transferred to ICU on Bipap with New CHF. Improved On lasix. Now on 2 L O2. SOB better. Edema resolved. Complains of back pain, abdominal pain and constipation. Feels uncomfortable  REVIEW OF SYSTEMS:  CONSTITUTIONAL: No fever, has weakness.  EYES: No blurred or double vision.  EARS, NOSE, AND THROAT: No tinnitus or ear pain.  RESPIRATORY: has cough and shortness of breath, no wheezing or hemoptysis.  CARDIOVASCULAR: No chest pain, orthopnea, edema.  GASTROINTESTINAL: No nausea, vomiting, diarrhea or abdominal pain.  GENITOURINARY: No dysuria, hematuria.  ENDOCRINE: No polyuria, nocturia,  HEMATOLOGY: No anemia, easy bruising or bleeding SKIN: No rash or lesion. MUSCULOSKELETAL: No joint pain or arthritis.   NEUROLOGIC: No tingling, numbness, weakness.  PSYCHIATRY: No anxiety or depression.   DRUG ALLERGIES:  No Known Allergies  VITALS:  Blood pressure 115/45, pulse 74, temperature 97.8 F (36.6 C), temperature source Oral, resp. rate 18, height 5\' 6"  (1.676 m), weight 91.5 kg (201 lb 11.5 oz), SpO2 96 %.  PHYSICAL EXAMINATION:  GENERAL:  69 y.o.-year-old patient lying in the bed with no acute distress.  EYES: Pupils equal, round, reactive to light and accommodation. No scleral icterus. Extraocular muscles intact.  HEENT: Head atraumatic, normocephalic. Oropharynx and nasopharynx clear.  NECK:  Supple, no jugular venous distention. No thyroid enlargement, no tenderness.  LUNGS:  no wheezing, rales,rhonchi or crepitation.  CARDIOVASCULAR: S1, S2 normal. No murmurs, rubs, or gallops.  ABDOMEN: Soft, nontender, nondistended. Bowel sounds present. No  organomegaly or mass.  EXTREMITIES: No pedal edema, cyanosis, or clubbing.  NEUROLOGIC: Cranial nerves II through XII are intact. Muscle strength 4-5/5 in all extremities. Sensation intact. Gait not checked.  PSYCHIATRIC: The patient is alert and oriented x 3.  SKIN: No obvious rash, lesion, or ulcer.    LABORATORY PANEL:   CBC  Recent Labs Lab 07/22/15 1316  WBC 12.3*  HGB 14.8  HCT 46.3  PLT 185   ------------------------------------------------------------------------------------------------------------------  Chemistries   Recent Labs Lab 07/18/15 0703  07/24/15 0453  NA 136  < > 138  K 3.5  < > 3.7  CL 101  < > 96*  CO2 28  < > 33*  GLUCOSE 250*  < > 175*  BUN 7  < > 16  CREATININE 0.49  < > 0.53  CALCIUM 8.5*  < > 8.6*  AST 20  --   --   ALT 42  --   --   ALKPHOS 115  --   --   BILITOT 0.6  --   --   < > = values in this interval not displayed. ------------------------------------------------------------------------------------------------------------------  Cardiac Enzymes  Recent Labs Lab 07/23/15 [id number]  TROPONINI <0.03   ------------------------------------------------------------------------------------------------------------------  RADIOLOGY:  Dg Chest Port 1 View  07/23/2015  CLINICAL DATA:  Respiratory failure EXAM: PORTABLE CHEST 1 VIEW COMPARISON:  Yesterday FINDINGS: Improved pulmonary edema. There is still hazy appearance of the lower chest, likely atelectasis and pleural fluid. Stable heart size and mediastinal contours. Background interstitial coarsening and hyperinflation consistent with patient's known COPD. No pneumothorax. IMPRESSION: 1. Improved CHF. 2. Background COPD. Electronically Signed   By: Monte Fantasia M.D.   On: 07/23/2015  07:29    EKG:   Orders placed or performed during the hospital encounter of 07/17/15  . ED EKG  . ED EKG  . EKG 12-Lead  . EKG 12-Lead  . EKG 12-Lead  . EKG 12-Lead    ASSESSMENT AND PLAN:   #  Community acquired pneumonia-left lower lobe with hypoxia  Due to Citrobacter and Klebsiella continue abx  urine for Legionella antigen and streptococcal antigen are negative. continue nebulizer treatments as needed. weaned off oxygen via nasal cannula. flutter valve, overall has improved and now is being weaned off oxygen  # Acute systolic congestive heart failure Likely has chronic chf due to untreated HTN/DM Systolic per recent Echo , ejection fraction of 45-50% and no wall motion abnormalities were found. On IV lasix and improved, changed to oral Lasix. Continue ACEi. coreg  # Acute hypoxic resp failure with hypoxia- Improved, wean off oxygen therapy  # Malignant hypertension. Better controlled. continue amlodipine. Hydralazine iv prn. Continue Coreg. Continue lisinopril.  # Diabetes mellitus-noncompliant with medications. Uncontrolled. Due to steroids On Lantus. SSI. ADA  # History of coronary artery disease status post stents Patient is asymptomatic but not seen by any cardiologist in several years Started aspirin 81 mg and low-dose beta blocker, started lipitor. Would benefit from a stress test as outpatient due to acute systolic CHF.   # HLP. Per lipid panel, started lipitor.  # Tobacco abuse Counseled patient to quit smoking  # Elevated alkaline phosphatase  repeated alkaline phosphatase is normal.  # COPD. Stable. Continue NEB.  All the records are reviewed and case discussed with Care Management/Social Workerr. Management plans discussed with the patient, family and they are in agreement.  CODE STATUS: DNR  TOTAL TIME TAKING CARE OF THIS PATIENT: 40 minutes.     Theodoro Grist M.D on 07/24/2015 at 2:20 PM  Between 7am to 6pm - Pager - [phone]  After 6pm go to [email or web address] - password [email or web address] [facility]  [place] [facility]  Office  [phone]  CC: Primary care physician; No PCP Per Patient

## 2015-07-24 NOTE — Care Management (Signed)
SATURATION QUALIFICATIONS: (This note is used to comply with regulatory documentation for home oxygen)  Patient Saturations on Room Air at Rest = Per Fraser Din  From PT , room air at rest sats 86% prior to ambulation.  Verbally reported, and documented on flow sheets

## 2015-07-24 NOTE — Telephone Encounter (Signed)
LMTB x1 for pt

## 2015-07-24 NOTE — Care Management (Signed)
Qualifying O2 sats to be documented by PT.  MD to place order for home O2 in anticipation for discharge.  Will from [facility] notified.

## 2015-07-24 NOTE — Clinical Documentation Improvement (Signed)
Internal Medicine  Can the diagnosis of CHF be further specified?    Acuity - Acute, Chronic, Acute on Chronic   Type - Systolic, Diastolic, Systolic and Diastolic  Other  Clinically Undetermined  Supporting Information: ECHO- EF= 45-50%   Please exercise your independent, professional judgment when responding. A specific answer is not anticipated or expected.   Thank You,  Rolm Gala, RN, [place] [phone]

## 2015-07-24 NOTE — Telephone Encounter (Signed)
-----   Message from Wilhelmina Mcardle, MD sent at 07/23/2015  6:01 PM EST ----- Please arrange for post-hospital follow up in 3-4 wks with me if possible or any pulm MD. Thanks  Waunita Schooner

## 2015-07-24 NOTE — Progress Notes (Signed)
Nutrition Follow-up    INTERVENTION:   Meals and Snacks: Cater to patient preferences  NUTRITION DIAGNOSIS:   Inadequate oral intake related to poor appetite as evidenced by per patient/family report.  GOAL:   Patient will meet greater than or equal to 90% of their needs  MONITOR:    (Energy Intake, Electrolyte and renal Profile, Anthropometrics, Digestive System, glucose Profile)  REASON FOR ASSESSMENT:   Malnutrition Screening Tool    ASSESSMENT:    Diet Order:  Diet - low sodium heart healthy Diet Carb Modified Diet Carb Modified Fluid consistency:: Thin; Room service appropriate?: Yes   Energy Intake: recorded po intake 90% of meals on average, appetite good  Electrolyte and Renal Profile:  Recent Labs Lab 07/22/15 1316 07/22/15 2016 07/24/15 0453  BUN 15 15 16   CREATININE 0.66 0.63 0.53  NA 129* 136 138  K 4.2 4.6 3.7   Glucose Profile:  Recent Labs  07/23/15 1655 07/23/15 2048 07/24/15 0724  GLUCAP 205* 241* 171*   Meds: lasix, ss novolog  Height:   Ht Readings from Last 1 Encounters:  07/22/15 5\' 6"  (1.676 m)    Weight:   Wt Readings from Last 1 Encounters:  07/22/15 201 lb 11.5 oz (91.5 kg)   Filed Weights   07/17/15 1427 07/17/15 2208 07/22/15 1300  Weight: 200 lb (90.719 kg) 193 lb 3.2 oz (87.635 kg) 201 lb 11.5 oz (91.5 kg)    BMI:  Body mass index is 32.57 kg/(m^2).  EDUCATION NEEDS:   No education needs identified at this time  [place] MS, [place], LDN [phone] Pager

## 2015-07-24 NOTE — Progress Notes (Signed)
Patient complaining of constipation, Dr. Ether Griffins ordered colace, miralax, and fleets enema.  All given to patient and patient had BM. Patient resting comfortably at this time.  erythromycin

## 2015-07-24 NOTE — Care Management (Signed)
Patient admitted from home with PNA.  Patient lives at home with her son.  Patient obtains her medications from [facility] on [address].  Patient states that she has a BSC, walker, Rollator, and cane at home, however does not require the use of any of these.  Patient drives and is still employed.  States that she works 12 days on and 2 days off. Patient is acutely on O2 and will need qualifying O2 sats if O2 is needed at time of discharge.

## 2015-07-24 NOTE — Progress Notes (Signed)
Physical Therapy Treatment/ Re-evaluation Patient Details Name: Sarah White MRN: [id number] DOB: Aug 25, 1946 Today's Date: 07/24/2015    History of Present Illness Patient presented to ER on 11/16 with shortness of breath, she was progressing well when she encountered additional respiratory distress. Patient transferred to ICU on BiPap for acute pulmonary edema and respiratory atelectasis.   Patient previously evaluated on this admission and was not recommended any PT follow up as she was independent with all mobility. Unfortunately she required BiPap for respiratory distress. On this re-evaluation patient demonstrates minimal higher level balance deficits as well as requirement for O2 to maintain reasonable O2 sats. On room air at rest she was noted to be at 86% saturation, on 2L of O2 with ambulation she maintained between 87-90% O2 sats during ambulation. She appears to be slightly off balance compared to her normal (lateral swaying from Trendelenburg sign noted). Otherwise patient appears at her baseline. Would recommend HHPT to address cardiopulmonary deficits followed by pulmonary rehab as appropriate.   PT Comments      Follow Up Recommendations  Home health PT     Equipment Recommendations       Recommendations for Other Services       Precautions / Restrictions Precautions Precautions: None Restrictions Weight Bearing Restrictions: No    Mobility  Bed Mobility               General bed mobility comments: Pt received in chair.   Transfers Overall transfer level: Independent               General transfer comment: No deficits in standing.   Ambulation/Gait Ambulation/Gait assistance: Supervision Ambulation Distance (Feet): 200 Feet   Gait Pattern/deviations: WFL(Within Functional Limits);Drifts right/left   Gait velocity interpretation: Below normal speed for age/gender General Gait Details: Patient noted to sway laterally, likely a result of  deconditioning over her course of admission. During rest on RA - 86%, during ambulation on 2L between 87-90%. No loss of balance noted.    Stairs            Wheelchair Mobility    Modified Rankin (Stroke Patients Only)       Balance Overall balance assessment: Needs assistance   Sitting balance-Leahy Scale: Normal       Standing balance-Leahy Scale: Good Standing balance comment: Modified DGI of 11/12, 5x sit to stand approximately 13-15 seconds.                     Cognition Arousal/Alertness: Awake/alert Behavior During Therapy: WFL for tasks assessed/performed Overall Cognitive Status: Within Functional Limits for tasks assessed                      Exercises      General Comments        Pertinent Vitals/Pain Pain Assessment: No/denies pain    Home Living Family/patient expects to be discharged to:: Private residence Living Arrangements: Children (son) Available Help at Discharge: Family Type of Home: House Home Access: Stairs to enter Entrance Stairs-Rails: Right Home Layout: One level        Prior Function Level of Independence: Independent      Comments: no difficulties   PT Goals (current goals can now be found in the care plan section) Acute Rehab PT Goals Patient Stated Goal: go back to work  PT Goal Formulation: With patient Time For Goal Achievement: 08/07/15 Potential to Achieve Goals: Good    Frequency  Min 2X/week  PT Plan      Co-evaluation             End of Session Equipment Utilized During Treatment: Gait belt;Oxygen Activity Tolerance: Patient tolerated treatment well Patient left: in bed;with bed alarm set;with call bell/phone within reach     Time: 1417-1430 PT Time Calculation (min) (ACUTE ONLY): 13 min  Charges:                       G Codes:      Kerman Passey, PT, DPT    07/24/2015, 5:18 PM

## 2015-07-24 NOTE — Progress Notes (Signed)
Patient has rested quietly tonight. Minimal complaints of pain acknowledged and treated with PRN medications; no signs of distress noted, but patient did complain of her vaginal area being sore. Patient has a urethral catheter due to aggressive IV diuresis and educated patient that it would be removed once the doctor determines that it is no longer needed. Nursing staff will continue to monitor. Earleen Reaper, RN

## 2015-07-24 NOTE — Progress Notes (Signed)
PT Cancellation Note  Patient Details Name: Sarah White MRN: [id number] DOB: 1945/11/23   Cancelled Treatment:    Reason Eval/Treat Not Completed: Patient declined, no reason specified. PT reviewed chart and entered patient's room. Patient states she has been constipated and attempted to use toilet for 4 hours, finally successful. She declines mobility eval currently as she is exhausted. PT will re-attempt at a later date/time.   Kerman Passey, PT, DPT    07/24/2015, 12:17 PM

## 2015-07-25 DIAGNOSIS — I5021 Acute systolic (congestive) heart failure: Secondary | ICD-10-CM

## 2015-07-25 DIAGNOSIS — M545 Low back pain, unspecified: Secondary | ICD-10-CM

## 2015-07-25 DIAGNOSIS — I129 Hypertensive chronic kidney disease with stage 1 through stage 4 chronic kidney disease, or unspecified chronic kidney disease: Secondary | ICD-10-CM

## 2015-07-25 DIAGNOSIS — I10 Essential (primary) hypertension: Secondary | ICD-10-CM | POA: Insufficient documentation

## 2015-07-25 DIAGNOSIS — J441 Chronic obstructive pulmonary disease with (acute) exacerbation: Secondary | ICD-10-CM

## 2015-07-25 LAB — GLUCOSE, CAPILLARY
GLUCOSE-CAPILLARY: 177 mg/dL — AB (ref 65–99)
GLUCOSE-CAPILLARY: 239 mg/dL — AB (ref 65–99)
Glucose-Capillary: 164 mg/dL — ABNORMAL HIGH (ref 65–99)

## 2015-07-25 MED ORDER — FUROSEMIDE 40 MG PO TABS: 40.0000 mg | ORAL_TABLET | Freq: Every day | ORAL | Status: DC

## 2015-07-25 MED ORDER — LEVOFLOXACIN 500 MG PO TABS: 500.0000 mg | ORAL_TABLET | Freq: Every day | ORAL | Status: DC

## 2015-07-25 MED ORDER — SENNA 8.6 MG PO TABS: 1.0000 | ORAL_TABLET | Freq: Every day | ORAL | Status: DC | PRN

## 2015-07-25 MED ORDER — DOCUSATE SODIUM 100 MG PO CAPS: 100.0000 mg | ORAL_CAPSULE | Freq: Two times a day (BID) | ORAL | Status: DC

## 2015-07-25 MED ORDER — BUDESONIDE-FORMOTEROL FUMARATE 160-4.5 MCG/ACT IN AERO: 2.0000 | INHALATION_SPRAY | Freq: Two times a day (BID) | RESPIRATORY_TRACT | Status: DC

## 2015-07-25 MED ORDER — LIDOCAINE 5 % EX PTCH: 1.0000 | MEDICATED_PATCH | CUTANEOUS | Status: DC

## 2015-07-25 MED ORDER — ALPRAZOLAM 0.5 MG PO TABS: 0.2500 mg | ORAL_TABLET | Freq: Every evening | ORAL | Status: DC | PRN

## 2015-07-25 MED ORDER — ALBUTEROL SULFATE (2.5 MG/3ML) 0.083% IN NEBU: 2.5000 mg | INHALATION_SOLUTION | RESPIRATORY_TRACT | Status: DC | PRN

## 2015-07-25 MED ORDER — CARVEDILOL 12.5 MG PO TABS: 12.5000 mg | ORAL_TABLET | Freq: Two times a day (BID) | ORAL | Status: DC

## 2015-07-25 NOTE — Progress Notes (Signed)
Dr. Ether Griffins made aware of RA sats, resting and ambulating

## 2015-07-25 NOTE — Progress Notes (Signed)
O2 weaned to 1Lper [place], will check sats shortly, attempting to wean off

## 2015-07-25 NOTE — Plan of Care (Signed)
Problem: Safety: Goal: Ability to remain free from injury will improve Outcome: Progressing Fall precautions in place  Problem: Respiratory: Goal: Respiratory status will improve Outcome: Progressing Attempting to wean O2

## 2015-07-25 NOTE — Progress Notes (Signed)
Pt discharged to home via wc.  Instructions and rx given to pt.  Questions answered.  No distress.  

## 2015-07-25 NOTE — Progress Notes (Signed)
Resting RA sat 91, walked pt on RA sat 90-94%

## 2015-07-25 NOTE — Progress Notes (Signed)
        To Whom It May Concern:      [name] was hospitalized 11.16.2016 through 11.24.2016. She is to return to work at full capacity  12.1.2016. Thank you for your understanding.      Sincerely,  Theodoro Grist, MD

## 2015-07-26 NOTE — Discharge Summary (Signed)
[facility] [place] NAME: Sarah White    MR#:  [id number]  DATE OF BIRTH:  [birth year]  DATE OF ADMISSION:  07/17/2015 ADMITTING PHYSICIAN: Nicholes Mango, MD  DATE OF DISCHARGE: 07/25/2015  6:48 PM  PRIMARY CARE PHYSICIAN: No PCP Per Patient     ADMISSION DIAGNOSIS:  SOB (shortness of breath) [R06.02] Community acquired pneumonia [J18.9]  DISCHARGE DIAGNOSIS:  Principal Problem:   Acute respiratory failure with hypoxia and hypercarbia (HCC) Active Problems:   Pulmonary edema   Acute systolic CHF (congestive heart failure) (HCC)   Community acquired pneumonia   Smoker   Encephalopathy acute   COPD (chronic obstructive pulmonary disease) (HCC)   Essential hypertension, malignant   COPD exacerbation (HCC)   DM2 (diabetes mellitus, type 2) (HCC)   CAD (coronary artery disease)   Low back pain   SECONDARY DIAGNOSIS:   Past Medical History  Diagnosis Date  . COPD (chronic obstructive pulmonary disease) ([place])   . Diabetes mellitus without complication ([place])   . Cancer ([place])     uterine    .pro HOSPITAL COURSE:  Sarah White is a 69 y.o. female with a known history of COPD, diabetes mellitus, [place] artery disease status post stents is presenting to the ED with a chief complaint of worsening of shortness of breath. Patient complained feeling dizzy but denies any loss of consciousness. Denied chest pain. Has complained of having nonproductive cough for the past few days. No sick contacts. Denied any fever. Patient was not seen by any primary care physician and cardiologist in the past several years. Chest x-ray has revealed a left-sided pneumonia. Admitted for Pneumonia/COPD exacerbation. The patient 's condition has worsened 11/21  and she was transferred to ICU on Bipap with New CHF. Started on lasix and improved. Echo showed EF 45-50%, mild MR, finally patient was weaned off oxygen to room air. Discussion by  problem: # Community acquired pneumonia in left lower lobe with acute respiratory failure with hypoxia and hypercapnea. Due to Citrobacter and Klebsiella continue levaquin to complete course. urine for Legionella antigen and streptococcal antigen were negative. continue nebulizer treatments as needed.   # Acute systolic congestive heart failure Likely has chronic chf due to untreated HTN/DM Systolic per recent Echo , ejection fraction of 45-50% and no wall motion abnormalities were found. On IV lasix and improved, changed to oral Lasix, continue. Continue ACEi. Coreg, follow up with PCP in 2-3 days  # Acute hypoxic resp failure with hypoxia and hypercapnea- Improved, weaned off oxygen to RA  # Malignant hypertension. Better controlled. continue amlodipine, Coreg, lisinopril.  # Diabetes mellitus-noncompliant with medications. Uncontrolled. Due to steroids On Lantus. SSI. ADA, blood glucose is about 150  # History of coronary artery disease status post stents Patient is asymptomatic but not seen by any cardiologist in several years Continue aspirin 81 mg and low-dose beta blocker, lipitor. Would benefit from a stress test as outpatient due to acute systolic CHF.   # HLP. Per lipid panel, started lipitor.  # Tobacco abuse Counseled patient to quit smoking  # Elevated alkaline phosphatase repeated alkaline phosphatase was normal.  # COPD exacerbation due to pneumonia, continue nebulizers, inhalers, tiotropium.  DISCHARGE CONDITIONS:   stable  CONSULTS OBTAINED:     DRUG ALLERGIES:  No Known Allergies  DISCHARGE MEDICATIONS:   Discharge Medication List as of 07/25/2015  5:39 PM    START taking these medications   Details  albuterol (PROVENTIL) (2.5 MG/3ML) 0.083%  nebulizer solution Take 3 mLs (2.5 mg total) by nebulization every 3 (three) hours as needed for wheezing or shortness of breath., Starting 07/25/2015, Until Discontinued, Normal    amLODipine (NORVASC)  10 MG tablet Take 1 tablet (10 mg total) by mouth daily., Starting 07/22/2015, Until Discontinued, Print    budesonide-formoterol (SYMBICORT) 160-4.5 MCG/ACT inhaler Inhale 2 puffs into the lungs 2 (two) times daily., Starting 07/25/2015, Until Discontinued, Normal    carvedilol (COREG) 12.5 MG tablet Take 1 tablet (12.5 mg total) by mouth 2 (two) times daily with a meal., Starting 07/25/2015, Until Discontinued, Normal    docusate sodium (COLACE) 100 MG capsule Take 1 capsule (100 mg total) by mouth 2 (two) times daily., Starting 07/25/2015, Until Discontinued, Normal    furosemide (LASIX) 40 MG tablet Take 1 tablet (40 mg total) by mouth daily., Starting 07/25/2015, Until Discontinued, Normal    glipiZIDE (GLUCOTROL) 10 MG tablet Take 1 tablet (10 mg total) by mouth 2 (two) times daily before a meal., Starting 07/22/2015, Until Discontinued, Print    lidocaine (LIDODERM) 5 % Place 1 patch onto the skin daily. Remove & Discard patch within 12 hours or as directed by MD, Starting 07/25/2015, Until Discontinued, Normal    lisinopril (PRINIVIL,ZESTRIL) 20 MG tablet Take 1 tablet (20 mg total) by mouth daily., Starting 07/22/2015, Until Discontinued, Print    senna (SENOKOT) 8.6 MG TABS tablet Take 1 tablet (8.6 mg total) by mouth daily as needed for mild constipation., Starting 07/25/2015, Until Discontinued, Normal    tiotropium (SPIRIVA HANDIHALER) 18 MCG inhalation capsule Place 1 capsule (18 mcg total) into inhaler and inhale daily., Starting 07/22/2015, Until Discontinued, Print    ALPRAZolam (XANAX) 0.5 MG tablet Take 0.5 tablets (0.25 mg total) by mouth at bedtime as needed for sleep., Starting 07/22/2015, Until Discontinued, Print      CONTINUE these medications which have CHANGED   Details  levofloxacin (LEVAQUIN) 500 MG tablet Take 1 tablet (500 mg total) by mouth daily., Starting 07/25/2015, Until Discontinued, Print      CONTINUE these medications which have NOT CHANGED    Details  albuterol-ipratropium (COMBIVENT) 18-103 MCG/ACT inhaler Inhale 1 puff into the lungs every 4 (four) hours as needed for wheezing or shortness of breath., Until Discontinued, Historical Med    aspirin EC 81 MG tablet Take 81 mg by mouth daily., Until Discontinued, Historical Med    doxylamine, Sleep, (UNISOM) 25 MG tablet Take 25 mg by mouth at bedtime as needed. Pt takes a 1/2 pill for sleeping at bedtime, Until Discontinued, Historical Med         DISCHARGE INSTRUCTIONS:    Follow up with PCP within 1 week  If you experience worsening of your admission symptoms, develop shortness of breath, life threatening emergency, suicidal or homicidal thoughts you must seek medical attention immediately by calling 911 or calling your MD immediately  if symptoms less severe.  You Must read complete instructions/literature along with all the possible adverse reactions/side effects for all the Medicines you take and that have been prescribed to you. Take any new Medicines after you have completely understood and accept all the possible adverse reactions/side effects.   Please note  You were cared for by a hospitalist during your hospital stay. If you have any questions about your discharge medications or the care you received while you were in the hospital after you are discharged, you can call the unit and asked to speak with the hospitalist on call if the hospitalist that took  care of you is not available. Once you are discharged, your primary care physician will handle any further medical issues. Please note that NO REFILLS for any discharge medications will be authorized once you are discharged, as it is imperative that you return to your primary care physician (or establish a relationship with a primary care physician if you do not have one) for your aftercare needs so that they can reassess your need for medications and monitor your lab values.    Today   CHIEF COMPLAINT:   Chief  Complaint  Patient presents with  . Shortness of Breath  . Dizziness    HISTORY OF PRESENT ILLNESS:  Sarah White  is a 69 y.o. female with a known history of COPD, diabetes mellitus, [place] artery disease status post stents is presenting to the ED with a chief complaint of worsening of shortness of breath. Patient complained feeling dizzy but denies any loss of consciousness. Denied chest pain. Has complained of having nonproductive cough for the past few days. No sick contacts. Denied any fever. Patient was not seen by any primary care physician and cardiologist in the past several years. Chest x-ray has revealed a left-sided pneumonia. Admitted for Pneumonia/COPD exacerbation. The patient 's condition has worsened 11/21  and she was transferred to ICU on Bipap with New CHF. Started on lasix and improved. Echo showed EF 45-50%, mild MR, finally patient was weaned off oxygen to room air. Discussion by problem: # Community acquired pneumonia in left lower lobe with acute respiratory failure with hypoxia and hypercapnea. Due to Citrobacter and Klebsiella continue levaquin to complete course. urine for Legionella antigen and streptococcal antigen were negative. continue nebulizer treatments as needed.   # Acute systolic congestive heart failure Likely has chronic chf due to untreated HTN/DM Systolic per recent Echo , ejection fraction of 45-50% and no wall motion abnormalities were found. On IV lasix and improved, changed to oral Lasix, continue. Continue ACEi. Coreg, follow up with PCP in 2-3 days  # Acute hypoxic resp failure with hypoxia and hypercapnea- Improved, weaned off oxygen to RA  # Malignant hypertension. Better controlled. continue amlodipine, Coreg, lisinopril.  # Diabetes mellitus-noncompliant with medications. Uncontrolled. Due to steroids On Lantus. SSI. ADA, blood glucose is about 150  # History of coronary artery disease status post stents Patient is  asymptomatic but not seen by any cardiologist in several years Continue aspirin 81 mg and low-dose beta blocker, lipitor. Would benefit from a stress test as outpatient due to acute systolic CHF.   # HLP. Per lipid panel, started lipitor.  # Tobacco abuse Counseled patient to quit smoking  # Elevated alkaline phosphatase repeated alkaline phosphatase was normal.  # COPD exacerbation due to pneumonia, continue nebulizers, inhalers, tiotropium.     VITAL SIGNS:  Blood pressure 146/58, pulse 80, temperature 98.3 F (36.8 C), temperature source Oral, resp. rate 22, height 5\' 6"  (1.676 m), weight 91.5 kg (201 lb 11.5 oz), SpO2 92 %.  I/O:   Intake/Output Summary (Last 24 hours) at 07/26/15 1624 Last data filed at 07/25/15 1700  Gross per 24 hour  Intake    240 ml  Output      0 ml  Net    240 ml    PHYSICAL EXAMINATION:  GENERAL:  69 y.o.-year-old patient lying in the bed with no acute distress.  EYES: Pupils equal, round, reactive to light and accommodation. No scleral icterus. Extraocular muscles intact.  HEENT: Head atraumatic, normocephalic. Oropharynx and nasopharynx clear.  NECK:  Supple, no jugular venous distention. No thyroid enlargement, no tenderness.  LUNGS: Normal breath sounds bilaterally, no wheezing, rales,rhonchi or crepitation. No use of accessory muscles of respiration.  CARDIOVASCULAR: S1, S2 normal. No murmurs, rubs, or gallops.  ABDOMEN: Soft, non-tender, non-distended. Bowel sounds present. No organomegaly or mass.  EXTREMITIES: No pedal edema, cyanosis, or clubbing.  NEUROLOGIC: Cranial nerves II through XII are intact. Muscle strength 5/5 in all extremities. Sensation intact. Gait not checked.  PSYCHIATRIC: The patient is alert and oriented x 3.  SKIN: No obvious rash, lesion, or ulcer.   DATA REVIEW:   CBC  Recent Labs Lab 07/22/15 1316  WBC 12.3*  HGB 14.8  HCT 46.3  PLT 185    Chemistries   Recent Labs Lab 07/24/15 0453  NA 138  K  3.7  CL 96*  CO2 33*  GLUCOSE 175*  BUN 16  CREATININE 0.53  CALCIUM 8.6*    Cardiac Enzymes  Recent Labs Lab 07/23/15 [id number]  TROPONINI <0.03    Microbiology Results  Results for orders placed or performed during the hospital encounter of 07/17/15  Blood culture (routine x 2)     Status: None   Collection Time: 07/17/15  6:01 PM  Result Value Ref Range Status   Specimen Description BLOOD LEFT ASSIST CONTROL  Final   Special Requests BOTTLES DRAWN AEROBIC AND ANAEROBIC 4CC  Final   Culture NO GROWTH 7 DAYS  Final   Report Status 07/24/2015 FINAL  Final  Blood culture (routine x 2)     Status: None   Collection Time: 07/17/15  6:01 PM  Result Value Ref Range Status   Specimen Description BLOOD RIGHT HAND  Final   Special Requests BOTTLES DRAWN AEROBIC AND ANAEROBIC 4CC  Final   Culture NO GROWTH 7 DAYS  Final   Report Status 07/24/2015 FINAL  Final  Culture, blood (routine x 2) Call MD if unable to obtain prior to antibiotics being given     Status: None   Collection Time: 07/17/15  9:40 PM  Result Value Ref Range Status   Specimen Description BLOOD RIGHT ARM  Final   Special Requests BOTTLES DRAWN AEROBIC AND ANAEROBIC  3CC  Final   Culture NO GROWTH 6 DAYS  Final   Report Status 07/24/2015 FINAL  Final  Culture, blood (routine x 2) Call MD if unable to obtain prior to antibiotics being given     Status: None   Collection Time: 07/17/15  9:54 PM  Result Value Ref Range Status   Specimen Description BLOOD RIGHT HAND  Final   Special Requests BOTTLES DRAWN AEROBIC AND ANAEROBIC 7ML  Final   Culture NO GROWTH 7 DAYS  Final   Report Status 07/24/2015 FINAL  Final  Culture, sputum-assessment     Status: None   Collection Time: 07/19/15  7:10 AM  Result Value Ref Range Status   Specimen Description EXPECTORATED SPUTUM  Final   Special Requests NONE  Final   Sputum evaluation THIS SPECIMEN IS ACCEPTABLE FOR SPUTUM CULTURE  Final   Report Status 07/19/2015 FINAL  Final   Culture, respiratory (NON-Expectorated)     Status: None   Collection Time: 07/19/15  7:10 AM  Result Value Ref Range Status   Specimen Description EXPECTORATED SPUTUM  Final   Special Requests NONE Reflexed from [id number]  Final   Gram Stain   Final    FAIR SPECIMEN - 70-80% WBCS FEW WBC SEEN FEW GRAM POSITIVE COCCI FEW GRAM VARIABLE ROD FEW  GRAM NEGATIVE COCCOBACILLI    Culture   Final    LIGHT GROWTH KLEBSIELLA PNEUMONIAE LIGHT GROWTH CITROBACT[address]    Report Status 07/23/2015 FINAL  Final   Organism ID, Bacteria KLEBSIELLA PNEUMONIAE  Final   Organism ID, Bacteria CITROBACTER KOSERI  Final      Susceptibility   Citrobacter koseri - MIC*    CEFAZOLIN <=4 SENSITIVE Sensitive     CEFTRIAXONE <=1 SENSITIVE Sensitive     CIPROFLOXACIN <=0.25 SENSITIVE Sensitive     GENTAMICIN <=1 SENSITIVE Sensitive     IMIPENEM <=0.25 SENSITIVE Sensitive     NITROFURANTOIN 32 SENSITIVE Sensitive     TRIMETH/SULFA <=20 SENSITIVE Sensitive     PIP/TAZO Value in next row Sensitive      SENSITIVE<=4    * LIGHT GROWTH CITROBACTER KOSERI   Klebsiella pneumoniae - MIC*    AMPICILLIN Value in next row Resistant      SENSITIVE<=4    CEFAZOLIN Value in next row Sensitive      SENSITIVE<=4    CEFTRIAXONE Value in next row Sensitive      SENSITIVE<=4    CIPROFLOXACIN Value in next row Sensitive      SENSITIVE<=4    GENTAMICIN Value in next row Sensitive      SENSITIVE<=4    IMIPENEM Value in next row Sensitive      SENSITIVE<=4    NITROFURANTOIN Value in next row Sensitive      SENSITIVE<=4    TRIMETH/SULFA Value in next row Sensitive      SENSITIVE<=4    PIP/TAZO Value in next row Sensitive      SENSITIVE<=4    * LIGHT GROWTH KLEBSIELLA PNEUMONIAE  MRSA PCR Screening     Status: None   Collection Time: 07/22/15  2:42 PM  Result Value Ref Range Status   MRSA by PCR NEGATIVE NEGATIVE Final    Comment:        The GeneXpert MRSA Assay (FDA approved for NASAL specimens only), is one  component of a comprehensive MRSA colonization surveillance program. It is not intended to diagnose MRSA infection nor to guide or monitor treatment for MRSA infections.     RADIOLOGY:  No results found.  EKG:   Orders placed or performed during the hospital encounter of 07/17/15  . ED EKG  . ED EKG  . EKG 12-Lead  . EKG 12-Lead  . EKG 12-Lead  . EKG 12-Lead  . EKG      Management plans discussed with the patient, family and they are in agreement.  CODE STATUS:  Advance Directive Documentation        Most Recent Value   Type of Advance Directive  Living will   Pre-existing out of facility DNR order (yellow form or pink MOST form)     "MOST" Form in Place?        TOTAL TIME TAKING CARE OF THIS PATIENT: 40 minutes.    Theodoro Grist M.D on 07/26/2015 at 4:24 PM  Between 7am to 6pm - Pager - [phone]  After 6pm go to [email or web address] - password [email or web address] [facility]  [facility]  Office  [phone]  CC: Primary care physician; No PCP Per Patient

## 2015-07-26 NOTE — Telephone Encounter (Signed)
Left message to call back  

## 2015-07-29 ENCOUNTER — Encounter: Payer: Self-pay | Admitting: Family Medicine

## 2015-07-29 ENCOUNTER — Ambulatory Visit (INDEPENDENT_AMBULATORY_CARE_PROVIDER_SITE_OTHER): Payer: BLUE CROSS/BLUE SHIELD | Admitting: Family Medicine

## 2015-07-29 VITALS — BP 146/65 | HR 73 | Temp 99.4°F | Ht 64.5 in | Wt 191.0 lb

## 2015-07-29 DIAGNOSIS — Z1159 Encounter for screening for other viral diseases: Secondary | ICD-10-CM

## 2015-07-29 DIAGNOSIS — E1165 Type 2 diabetes mellitus with hyperglycemia: Secondary | ICD-10-CM | POA: Diagnosis not present

## 2015-07-29 DIAGNOSIS — Z23 Encounter for immunization: Secondary | ICD-10-CM | POA: Diagnosis not present

## 2015-07-29 DIAGNOSIS — I2510 Atherosclerotic heart disease of native coronary artery without angina pectoris: Secondary | ICD-10-CM

## 2015-07-29 DIAGNOSIS — I10 Essential (primary) hypertension: Secondary | ICD-10-CM | POA: Diagnosis not present

## 2015-07-29 DIAGNOSIS — J189 Pneumonia, unspecified organism: Secondary | ICD-10-CM | POA: Diagnosis not present

## 2015-07-29 DIAGNOSIS — I5021 Acute systolic (congestive) heart failure: Secondary | ICD-10-CM

## 2015-07-29 DIAGNOSIS — E785 Hyperlipidemia, unspecified: Secondary | ICD-10-CM | POA: Diagnosis not present

## 2015-07-29 DIAGNOSIS — J449 Chronic obstructive pulmonary disease, unspecified: Secondary | ICD-10-CM

## 2015-07-29 DIAGNOSIS — IMO0001 Reserved for inherently not codable concepts without codable children: Secondary | ICD-10-CM

## 2015-07-29 MED ORDER — LEVOFLOXACIN 500 MG PO TABS: 500.0000 mg | ORAL_TABLET | Freq: Every day | ORAL | Status: DC

## 2015-07-29 MED ORDER — VENTOLIN HFA 108 (90 BASE) MCG/ACT IN AERS: 1.0000 | INHALATION_SPRAY | RESPIRATORY_TRACT | Status: DC | PRN

## 2015-07-29 MED ORDER — ATORVASTATIN CALCIUM 40 MG PO TABS: 40.0000 mg | ORAL_TABLET | Freq: Every day | ORAL | Status: DC

## 2015-07-29 MED ORDER — TIOTROPIUM BROMIDE MONOHYDRATE 18 MCG IN CAPS: 18.0000 ug | ORAL_CAPSULE | Freq: Every day | RESPIRATORY_TRACT | Status: DC

## 2015-07-29 MED ORDER — LIRAGLUTIDE 18 MG/3ML ~~LOC~~ SOPN
3.0000 mL | PEN_INJECTOR | Freq: Every day | SUBCUTANEOUS | Status: DC
Start: 2015-07-29 — End: 2015-07-29

## 2015-07-29 MED ORDER — BUDESONIDE-FORMOTEROL FUMARATE 160-4.5 MCG/ACT IN AERO: 2.0000 | INHALATION_SPRAY | Freq: Two times a day (BID) | RESPIRATORY_TRACT | Status: DC

## 2015-07-29 MED ORDER — LISINOPRIL 5 MG PO TABS: 5.0000 mg | ORAL_TABLET | Freq: Every day | ORAL | Status: DC

## 2015-07-29 MED ORDER — LIRAGLUTIDE 18 MG/3ML ~~LOC~~ SOPN: 1.2000 mg | PEN_INJECTOR | Freq: Every day | SUBCUTANEOUS | Status: DC

## 2015-07-29 NOTE — Telephone Encounter (Signed)
LM on home number and son's cell number to call me back to schedule f/u appt.

## 2015-07-29 NOTE — Assessment & Plan Note (Signed)
Has not seen her cardiologist in years. Will refer to new cardiologist. Continue carvedilol, continue lasix for now. Continue to monitor.

## 2015-07-29 NOTE — Progress Notes (Addendum)
BP 146/65 mmHg  Pulse 73  Temp(Src) 99.4 F (37.4 C)  Ht 5' 4.5" (1.638 m)  Wt 191 lb (86.637 kg)  BMI 32.29 kg/m2  SpO2 96%   Subjective:    [name], female    DOB: [birth year], 69 y.o.   MRN: [id number]  HPI: Sarah White is a 68 y.o. female who presents today to establish care. She has not seen a PCP or cardiologist in several years.   Chief Complaint  Patient presents with  . hospital fuv    Patient was in the hospital due to pneumonia, along with [place] Time since discharge: 4 days Hospital/facility: [facility] Diagnosis: acute systolic CHF, acute respiratory failure, pulmonary edema, CAP- left sided, tobacco abuse, acute encephalopathy, COPD, malignant HTN, COPD exacerbation, DM2, CAD, low back pain Procedures/tests: ECHO, CXR Consultants: pulmonology New medications: levaquin to complete course, lasix, lisinopril, amlodipine, coreg, lipitor, ASA 81 Discharge instructions:  Follow up with [place] and cardiology. To see pulmonology Status: stable  DIABETES- made her nauseous and had visual changes on the metformin in the past. Was on 5 pills a day. A1c on 07/18/15 was 9.8 Hypoglycemic episodes:no Polydipsia/polyuria: yes Visual disturbance: yes Chest pain: no Paresthesias: no Glucose Monitoring: yes  Accucheck frequency: TID Taking Insulin?: no Blood Pressure Monitoring: not checking Retinal Examination: Up to Date Foot Exam: Up to Date Diabetic Education: Not Completed Pneumovax: Up to Date Influenza: Up to Date Aspirin: yes  SHORTNESS OF BREATH- breathing better Duration:  2 weeks Not on her antibiotic because never picked it up  HYPERTENSION / [place] Satisfied with current treatment? yes Duration of hypertension: chronic BP monitoring frequency: not checking BP range:  BP medication side effects: no Past BP meds: lasix, amlodipine, carvedilol and lisinopril Duration of hyperlipidemia: chronic Cholesterol  medication side effects: no Cholesterol supplements: none Past cholesterol medications: atorvastain (lipitor) Medication compliance: poor compliance Aspirin: yes Recent stressors: no Recurrent headaches: no Visual changes: no Palpitations: no Dyspnea: no Chest pain: no Lower extremity edema: no Dizzy/lightheaded: yes  Relevant past medical, surgical, family and social history reviewed and updated as indicated. Interim medical history since our last visit reviewed. Allergies and medications reviewed and updated.  Review of Systems  Constitutional: Negative.   HENT: Negative.   Respiratory: Negative.   Cardiovascular: Negative.   Endocrine: Negative.   Skin: Negative.   Neurological: Negative.   Psychiatric/Behavioral: Negative.    Per HPI unless specifically indicated above     Objective:    BP 146/65 mmHg  Pulse 73  Temp(Src) 99.4 F (37.4 C)  Ht 5' 4.5" (1.638 m)  Wt 191 lb (86.637 kg)  BMI 32.29 kg/m2  SpO2 96%  Wt Readings from Last 3 Encounters:  07/29/15 191 lb (86.637 kg)  07/22/15 201 lb 11.5 oz (91.5 kg)    Physical Exam  Constitutional: She is oriented to person, place, and time. She appears well-developed and well-nourished. No distress.  HENT:  Head: Normocephalic and atraumatic.  Right Ear: Hearing normal.  Left Ear: Hearing normal.  Nose: Nose normal.  Eyes: Conjunctivae and lids are normal. Right eye exhibits no discharge. Left eye exhibits no discharge. No scleral icterus.  Cardiovascular: Normal rate, regular rhythm, normal heart sounds and intact distal pulses.  Exam reveals no gallop and no friction rub.   No murmur heard. Pulmonary/Chest: Effort normal. No respiratory distress. She has decreased breath sounds in the right upper field, the right middle field, the right lower field,  the left upper field, the left middle field and the left lower field. She has no wheezes. She has no rales. She exhibits no tenderness.  Musculoskeletal: Normal  range of motion.  Neurological: She is alert and oriented to person, place, and time.  Skin: Skin is intact. No rash noted.  Psychiatric: She has a normal mood and affect. Her speech is normal and behavior is normal. Judgment and thought content normal. Cognition and memory are normal.  Nursing note and vitals reviewed.  [facility] from [facility] reviewed today. Will obtain records from former PCP.     Assessment & Plan:   Problem List Items Addressed This Visit      Cardiovascular and Mediastinum   CAD (coronary artery disease)    Has not seen her cardiologist in years. Will refer to new cardiologist. Continue carvedilol, continue lasix for now. Continue to monitor.       Relevant Medications   lisinopril (PRINIVIL,ZESTRIL) 5 MG tablet   atorvastatin (LIPITOR) 40 MG tablet   Other Relevant Orders   Ambulatory referral to Cardiology   Acute systolic CHF (congestive heart failure) ([place])    Lungs clear today. Continue lasix. Referral to cardiology made today. To see them on Wednesday.       Relevant Medications   lisinopril (PRINIVIL,ZESTRIL) 5 MG tablet   atorvastatin (LIPITOR) 40 MG tablet   Other Relevant Orders   Ambulatory referral to Cardiology   Essential hypertension, malignant    Uncontrolled. Diastolic low. Will not restart her amlodipine. Will start lower dose of her lisinopril. Will recheck in 2-3 weeks to see how she's doing with CMP at that time. To see cardiology on Wednesday. Home health to monitor BP at home to help determine if we should change her regimen or not.      Relevant Medications   lisinopril (PRINIVIL,ZESTRIL) 5 MG tablet   atorvastatin (LIPITOR) 40 MG tablet     Respiratory   Community acquired pneumonia    Did not complete levaquin. Rx sent to her pharmacy today. Finish levaquin. Will need repeat CXR after she finishes. Continue to monitor.       Relevant Medications   tiotropium (SPIRIVA HANDIHALER) 18 MCG inhalation capsule   budesonide-formoterol  (SYMBICORT) 160-4.5 MCG/ACT inhaler   VENTOLIN HFA 108 (90 BASE) MCG/ACT inhaler   levofloxacin (LEVAQUIN) 500 MG tablet   COPD (chronic obstructive pulmonary disease) (HCC)    To see pulmonology soon. Continue inhalers. Continue to monitor.       Relevant Medications   tiotropium (SPIRIVA HANDIHALER) 18 MCG inhalation capsule   budesonide-formoterol (SYMBICORT) 160-4.5 MCG/ACT inhaler   VENTOLIN HFA 108 (90 BASE) MCG/ACT inhaler     Endocrine   Diabetes mellitus type 2, uncontrolled ([place]) - Primary    Cannot tolerate metformin. Will start her on victoza to try to get blood sugars under better control. Recheck A1c in 3 months. Check in on sugars in 2-3 weeks.       Relevant Medications   lisinopril (PRINIVIL,ZESTRIL) 5 MG tablet   atorvastatin (LIPITOR) 40 MG tablet   Liraglutide 18 MG/3ML SOPN     Other   Hyperlipidemia    Off her atorvastatin for unknown reason. Will restart. Will recheck lipids at next visit.       Relevant Medications   lisinopril (PRINIVIL,ZESTRIL) 5 MG tablet   atorvastatin (LIPITOR) 40 MG tablet   Other Relevant Orders   Lipid Panel [place], Waived    Other Visit Diagnoses    Immunization due  Prevnar 13 given today. Up to date on vaccines.     Need for hepatitis C screening test        Will check next appointment.     Relevant Orders    Hepatitis C Antibody        Follow up plan: Return 2-3 weeks, for Records release.

## 2015-07-29 NOTE — Assessment & Plan Note (Signed)
Uncontrolled. Diastolic low. Will not restart her amlodipine. Will start lower dose of her lisinopril. Will recheck in 2-3 weeks to see how she's doing with CMP at that time. To see cardiology on Wednesday. Home health to monitor BP at home to help determine if we should change her regimen or not.

## 2015-07-29 NOTE — Assessment & Plan Note (Signed)
Did not complete levaquin. Rx sent to her pharmacy today. Finish levaquin. Will need repeat CXR after she finishes. Continue to monitor.

## 2015-07-29 NOTE — Assessment & Plan Note (Signed)
Off her atorvastatin for unknown reason. Will restart. Will recheck lipids at next visit.

## 2015-07-29 NOTE — Assessment & Plan Note (Signed)
Cannot tolerate metformin. Will start her on victoza to try to get blood sugars under better control. Recheck A1c in 3 months. Check in on sugars in 2-3 weeks.

## 2015-07-29 NOTE — Assessment & Plan Note (Signed)
Lungs clear today. Continue lasix. Referral to cardiology made today. To see them on Wednesday.

## 2015-07-29 NOTE — Assessment & Plan Note (Signed)
To see pulmonology soon. Continue inhalers. Continue to monitor.

## 2015-07-30 NOTE — Telephone Encounter (Signed)
Spoke with pt, schedule HFU on 12/14 with DS.  Nothing further needed.

## 2015-08-12 ENCOUNTER — Ambulatory Visit (INDEPENDENT_AMBULATORY_CARE_PROVIDER_SITE_OTHER): Payer: BLUE CROSS/BLUE SHIELD | Admitting: Family Medicine

## 2015-08-12 ENCOUNTER — Encounter: Payer: Self-pay | Admitting: Family Medicine

## 2015-08-12 VITALS — BP 123/68 | HR 86 | Temp 99.2°F | Wt 191.0 lb

## 2015-08-12 DIAGNOSIS — I10 Essential (primary) hypertension: Secondary | ICD-10-CM | POA: Diagnosis not present

## 2015-08-12 DIAGNOSIS — IMO0001 Reserved for inherently not codable concepts without codable children: Secondary | ICD-10-CM

## 2015-08-12 DIAGNOSIS — E785 Hyperlipidemia, unspecified: Secondary | ICD-10-CM | POA: Diagnosis not present

## 2015-08-12 DIAGNOSIS — I951 Orthostatic hypotension: Secondary | ICD-10-CM | POA: Diagnosis not present

## 2015-08-12 DIAGNOSIS — E1165 Type 2 diabetes mellitus with hyperglycemia: Secondary | ICD-10-CM

## 2015-08-12 DIAGNOSIS — Z1159 Encounter for screening for other viral diseases: Secondary | ICD-10-CM | POA: Diagnosis not present

## 2015-08-12 DIAGNOSIS — I2510 Atherosclerotic heart disease of native coronary artery without angina pectoris: Secondary | ICD-10-CM

## 2015-08-12 DIAGNOSIS — Z114 Encounter for screening for human immunodeficiency virus [HIV]: Secondary | ICD-10-CM

## 2015-08-12 LAB — MICROALBUMIN, URINE WAIVED
CREATININE, URINE WAIVED: 50 mg/dL (ref 10–300)
Microalb, Ur Waived: 10 mg/L (ref 0–19)

## 2015-08-12 MED ORDER — LISINOPRIL 2.5 MG PO TABS: 5.0000 mg | ORAL_TABLET | Freq: Every day | ORAL | Status: DC

## 2015-08-12 MED ORDER — DULAGLUTIDE 0.75 MG/0.5ML ~~LOC~~ SOAJ: 0.7500 mg | SUBCUTANEOUS | Status: DC

## 2015-08-12 NOTE — Progress Notes (Signed)
BP 123/68 mmHg  Pulse 86  Temp(Src) 99.2 F (37.3 C)  Wt 191 lb (86.637 kg)  SpO2 96%   Subjective:    [name], female    DOB: September 06, 1945, 69 y.o.   MRN: [id number]  HPI: Sarah White is a 69 y.o. female  Chief Complaint  Patient presents with  . Diabetes  . Hyperlipidemia  . Hypertension   Breathing is doing better. Going to see the lung doctor on Wednesday. Finished the levaquin. Feels like her breathing is better. Doing incentive spirometry, doing well with it.   HYPERTENSION / HYPERLIPIDEMIA- has been following with Dr. Clayborn Bigness, and under good control Satisfied with current treatment? yes Duration of hypertension: chronic BP monitoring frequency: a few times a day BP range: 120s/60s-70s BP medication side effects: no Duration of hyperlipidemia: chronic Cholesterol medication side effects: no Cholesterol supplements: none Medication compliance: excellent compliance Aspirin: yes Recent stressors: no Recurrent headaches: no Visual changes: no Palpitations: no Dyspnea: no Chest pain: no Lower extremity edema: no Dizzy/lightheaded: no  DIABETES- started with the victoza, had some bad nausea with it so stopped it.  Hypoglycemic episodes:no Polydipsia/polyuria: yes Visual disturbance: yes, floaters with bad headache yesterday, no weakness, better today Chest pain: no Paresthesias: no Glucose Monitoring: yes  Accucheck frequency: TID  Fasting glucose: 200s Taking Insulin?: no Blood Pressure Monitoring: not checking Retinal Examination: Not up to Date Foot Exam: Up to Date Diabetic Education: Not Completed Pneumovax: Up to Date Influenza: Up to Date Aspirin: yes  DIZZINESS Duration: since she came out of the hospital Description of symptoms: lightheaded Duration of episode: 4-5 minutes Dizziness frequency: 2-3x a day Provoking factors: none Aggravating factors:  none Triggered by rolling over in bed: no Triggered by bending over:  no Aggravated by head movement: no Aggravated by exertion, coughing, loud noises: no Recent head injury: no Recent or current viral symptoms: no History of vasovagal episodes: no Nausea: yes Vomiting: yes Tinnitus: no Hearing loss: no Aural fullness: no Headache: yes Photophobia/phonophobia: no Unsteady gait: yes Postural instability: yes Diplopia, dysarthria, dysphagia or weakness: no Related to exertion: no Pallor: no Diaphoresis: no Dyspnea: no Chest pain: no  Relevant past medical, surgical, family and social history reviewed and updated as indicated. Interim medical history since our last visit reviewed. Allergies and medications reviewed and updated.  Review of Systems  Constitutional: Negative.   Respiratory: Negative.   Cardiovascular: Negative.   Gastrointestinal: Positive for nausea. Negative for vomiting, abdominal pain, diarrhea, constipation, blood in stool, abdominal distention, anal bleeding and rectal pain.  Genitourinary: Negative.   Psychiatric/Behavioral: Negative.    Per HPI unless specifically indicated above     Objective:    BP 123/68 mmHg  Pulse 86  Temp(Src) 99.2 F (37.3 C)  Wt 191 lb (86.637 kg)  SpO2 96%  Wt Readings from Last 3 Encounters:  08/12/15 191 lb (86.637 kg)  07/29/15 191 lb (86.637 kg)  07/22/15 201 lb 11.5 oz (91.5 kg)    Orthostatic VS for the past 24 hrs:  BP- Lying Pulse- Lying BP- Sitting Pulse- Sitting BP- Standing at 0 minutes Pulse- Standing at 0 minutes  08/12/15 1204 123/69 mmHg 86 108/69 mmHg 69 98/65 mmHg 87    Physical Exam  Constitutional: She is oriented to person, place, and time. She appears well-developed and well-nourished. No distress.  HENT:  Head: Normocephalic and atraumatic.  Right Ear: Hearing normal.  Left Ear: Hearing normal.  Nose: Nose normal.  Eyes: Conjunctivae, EOM and  lids are normal. Pupils are equal, round, and reactive to light. Right eye exhibits no discharge. Left eye exhibits no  discharge. No scleral icterus.  Cardiovascular: Normal rate, regular rhythm, normal heart sounds and intact distal pulses.  Exam reveals no gallop and no friction rub.   No murmur heard. Pulmonary/Chest: Effort normal and breath sounds normal. No respiratory distress. She has no wheezes. She has no rales. She exhibits no tenderness.  Musculoskeletal: Normal range of motion.  Neurological: She is alert and oriented to person, place, and time.  Skin: Skin is warm, dry and intact. No rash noted. No erythema. No pallor.  Psychiatric: She has a normal mood and affect. Her speech is normal and behavior is normal. Judgment and thought content normal. Cognition and memory are normal.  Nursing note and vitals reviewed.     Assessment & Plan:   Problem List Items Addressed This Visit      Cardiovascular and Mediastinum   Essential hypertension, malignant    Under good control, but dropping when she stands. Will cut lisinopril in 1/2 and recheck in 2 weeks. Possibly dropping because of GLP-1 agonist.       Relevant Medications   lisinopril (PRINIVIL,ZESTRIL) 2.5 MG tablet   Other Relevant Orders   Microalbumin, Urine Waived   Orthostatic hypotension - Primary    BP dropping when she stands. Will cut lisinopril to 2.5mg  and check in 2 weeks.       Relevant Medications   lisinopril (PRINIVIL,ZESTRIL) 2.5 MG tablet     Endocrine   Diabetes mellitus type 2, uncontrolled ([place])    Could not tolerate victoza. Will try trulicity for ease of use. Samples given today. Will check back in in 2 weeks to see how she's doing.       Relevant Medications   lisinopril (PRINIVIL,ZESTRIL) 2.5 MG tablet   Dulaglutide (TRULICITY) [id number] [id number] SOPN   Other Relevant Orders   Comprehensive metabolic panel     Other   Hyperlipidemia    Checking labs again today. Await results.       Relevant Medications   lisinopril (PRINIVIL,ZESTRIL) 2.5 MG tablet   Other Relevant Orders   Lipid Panel w/o Chol/HDL  Ratio   Comprehensive metabolic panel    Other Visit Diagnoses    Need for hepatitis C screening test        Checked today. Await results.     Relevant Orders    Hepatitis C Antibody    Screening for HIV (human immunodeficiency virus)        Checked today. Await results.     Relevant Orders    HIV antibody        Follow up plan: Return in about 2 weeks (around 08/26/2015).

## 2015-08-12 NOTE — Assessment & Plan Note (Signed)
Could not tolerate victoza. Will try trulicity for ease of use. Samples given today. Will check back in in 2 weeks to see how she's doing.

## 2015-08-12 NOTE — Assessment & Plan Note (Signed)
Under good control, but dropping when she stands. Will cut lisinopril in 1/2 and recheck in 2 weeks. Possibly dropping because of GLP-1 agonist.

## 2015-08-12 NOTE — Assessment & Plan Note (Signed)
Checking labs again today. Await results.  

## 2015-08-12 NOTE — Assessment & Plan Note (Signed)
BP dropping when she stands. Will cut lisinopril to 2.5mg  and check in 2 weeks.

## 2015-08-13 ENCOUNTER — Encounter: Payer: Self-pay | Admitting: Family Medicine

## 2015-08-13 LAB — COMPREHENSIVE METABOLIC PANEL
ALK PHOS: 143 IU/L — AB (ref 39–117)
ALT: 24 IU/L (ref 0–32)
AST: 20 IU/L (ref 0–40)
Albumin/Globulin Ratio: 1.8 (ref 1.1–2.5)
Albumin: 4.2 g/dL (ref 3.6–4.8)
BUN/Creatinine Ratio: 16 (ref 11–26)
BUN: 12 mg/dL (ref 8–27)
Bilirubin Total: 0.4 mg/dL (ref 0.0–1.2)
CO2: 21 mmol/L (ref 18–29)
CREATININE: 0.74 mg/dL (ref 0.57–1.00)
Calcium: 9.3 mg/dL (ref 8.7–10.3)
Chloride: 95 mmol/L — ABNORMAL LOW (ref 96–106)
GFR calc Af Amer: 97 mL/min/{1.73_m2} (ref >59)
GFR calc non Af Amer: 84 mL/min/{1.73_m2} (ref >59)
GLOBULIN, TOTAL: 2.3 g/dL (ref 1.5–4.5)
GLUCOSE: 195 mg/dL — AB (ref 65–99)
Potassium: 4.7 mmol/L (ref 3.5–5.2)
SODIUM: 134 mmol/L (ref 134–144)
Total Protein: 6.5 g/dL (ref 6.0–8.5)

## 2015-08-13 LAB — HEPATITIS C ANTIBODY: Hep C Virus Ab: <0.1 s/co ratio (ref 0.0–0.9)

## 2015-08-13 LAB — LIPID PANEL W/O CHOL/HDL RATIO
Cholesterol, Total: 187 mg/dL (ref 100–199)
HDL: 40 mg/dL (ref >39)
LDL CALC: 90 mg/dL (ref 0–99)
Triglycerides: 285 mg/dL — ABNORMAL HIGH (ref 0–149)
VLDL CHOLESTEROL CAL: 57 mg/dL — AB (ref 5–40)

## 2015-08-13 LAB — HIV ANTIBODY (ROUTINE TESTING W REFLEX): HIV Screen 4th Generation wRfx: NONREACTIVE

## 2015-08-14 ENCOUNTER — Encounter: Payer: Self-pay | Admitting: Pulmonary Disease

## 2015-08-14 ENCOUNTER — Ambulatory Visit (INDEPENDENT_AMBULATORY_CARE_PROVIDER_SITE_OTHER): Payer: BLUE CROSS/BLUE SHIELD | Admitting: Pulmonary Disease

## 2015-08-14 VITALS — BP 144/82 | HR 75 | Ht 64.0 in | Wt 193.0 lb

## 2015-08-14 DIAGNOSIS — Z720 Tobacco use: Secondary | ICD-10-CM | POA: Diagnosis not present

## 2015-08-14 DIAGNOSIS — F17200 Nicotine dependence, unspecified, uncomplicated: Secondary | ICD-10-CM

## 2015-08-14 DIAGNOSIS — J449 Chronic obstructive pulmonary disease, unspecified: Secondary | ICD-10-CM | POA: Diagnosis not present

## 2015-08-15 NOTE — Progress Notes (Signed)
PROBLEMS: COPD Smoker CHF, resolved Hypertension  INTERVAL HISTORY: Seen in consultation during recent hospitalization by me for hypoxic respiratory failure due to CHF (hypertension induced) > COPD, possible CAP. I recommended 7 days abx, smoking cessation, hypertension mgmt, Symbicort and Spiriva and F/U in Pulm clinic  SUBJ: She follows up today with no new complaints. She denies CP, cough, hemoptysis, LE edema and calf tenderness. She has not smoked since discharge. She is compliant with with maintenance inhalers. She rarely uses rescue inhaler. She has had problems now with hypotension which is being addressed by her primary MD  OBJ: Filed Vitals:   08/14/15 0923  BP: 144/82  Pulse: 75  Height: 5\' 4"  (1.626 m)  Weight: 193 lb (87.544 kg)  SpO2: 96%   NAD HEENT WNL No JVD BS mildly diminished, no wheezes RRR s M NABS, soft No LE edema No focal neuroligical deficits  BMP Latest Ref Rng 08/12/2015 07/24/2015 07/22/2015  Glucose 65 - 99 mg/dL 195(H) 175(H) 357(H)  BUN 8 - 27 mg/dL 12 16 15   Creatinine 0.57 - 1.00 mg/dL 0.74 0.53 0.63  BUN/Creat Ratio 11 - 26 16 - -  Sodium 134 - 144 mmol/L 134 138 136  Potassium 3.5 - 5.2 mmol/L 4.7 3.7 4.6  Chloride 96 - 106 mmol/L 95(L) 96(L) 100(L)  CO2 18 - 29 mmol/L 21 33(H) 32  Calcium 8.7 - 10.3 mg/dL 9.3 8.6(L) 8.8(L)    CBC Latest Ref Rng 07/22/2015 07/21/2015 07/18/2015  WBC 3.6 - 11.0 K/uL 12.3(H) 7.3 8.1  Hemoglobin 12.0 - 16.0 g/dL 14.8 12.7 14.8  Hematocrit 35.0 - 47.0 % 46.3 38.3 44.3  Platelets 150 - 440 K/uL 185 128(L) 138(L)   CXR: 07/23/15 improving pulm edema  IMPRESSION: COPD - Clinical diagnosis. Has never had PFTs Smoker - currently abstinent  PLAN: I have congratulated her on her success at smoking abstinence and encouraged continued efforts to this end Cont Symbicort and Spiriva as maintenance COPD meds Continue PRN albuterol F/U in 3 months with PFTs    Wilhelmina Mcardle, MD [place]  [facility]

## 2015-08-27 ENCOUNTER — Encounter: Payer: Self-pay | Admitting: Family Medicine

## 2015-08-27 ENCOUNTER — Ambulatory Visit (INDEPENDENT_AMBULATORY_CARE_PROVIDER_SITE_OTHER): Payer: BLUE CROSS/BLUE SHIELD | Admitting: Family Medicine

## 2015-08-27 VITALS — BP 152/78 | HR 89 | Temp 98.2°F | Ht 63.4 in | Wt 195.0 lb

## 2015-08-27 DIAGNOSIS — I10 Essential (primary) hypertension: Secondary | ICD-10-CM

## 2015-08-27 DIAGNOSIS — I951 Orthostatic hypotension: Secondary | ICD-10-CM

## 2015-08-27 DIAGNOSIS — I2510 Atherosclerotic heart disease of native coronary artery without angina pectoris: Secondary | ICD-10-CM

## 2015-08-27 MED ORDER — LISINOPRIL 5 MG PO TABS: 5.0000 mg | ORAL_TABLET | Freq: Every day | ORAL | Status: DC

## 2015-08-27 NOTE — Assessment & Plan Note (Addendum)
BP elevated again. Will increase her lisinopril back to 5mg  and check back in 1 month. If BP good at cardiologist at the beginning of January, will send her back to work.

## 2015-08-27 NOTE — Progress Notes (Signed)
BP 152/78 mmHg  Pulse 89  Temp(Src) 98.2 F (36.8 C)  Ht 5' 3.4" (1.61 m)  Wt 195 lb (88.451 kg)  BMI 34.12 kg/m2  SpO2 96%   Subjective:    [name], female    DOB: 01-31-1946, 69 y.o.   MRN: [id number]  HPI: FLOR BIERNAT is a 69 y.o. female  Chief Complaint  Patient presents with  . Hypotension   Doing well with the trulicity. Feeling well. No nausea, no dizziness. Feeling better.   HYPERTENSION Hypertension status: worse  Satisfied with current treatment? yes Duration of hypertension: chronic BP monitoring frequency:  a few times a day BP range: 130s/70s BP medication side effects:  no Medication compliance: excellent compliance Aspirin: yes Recurrent headaches: no Visual changes: yes, gets dizzy and having trouble focusing on the computer Palpitations: no Dyspnea: no Chest pain: no Lower extremity edema: no Dizzy/lightheaded: no  Relevant past medical, surgical, family and social history reviewed and updated as indicated. Interim medical history since our last visit reviewed. Allergies and medications reviewed and updated.  Review of Systems  Constitutional: Negative.   Eyes: Positive for visual disturbance. Negative for photophobia, pain, discharge, redness and itching.  Respiratory: Negative.   Cardiovascular: Negative.   Neurological: Negative.   Psychiatric/Behavioral: Negative.     Per HPI unless specifically indicated above     Objective:    BP 152/78 mmHg  Pulse 89  Temp(Src) 98.2 F (36.8 C)  Ht 5' 3.4" (1.61 m)  Wt 195 lb (88.451 kg)  BMI 34.12 kg/m2  SpO2 96%  Wt Readings from Last 3 Encounters:  08/27/15 195 lb (88.451 kg)  08/14/15 193 lb (87.544 kg)  08/12/15 191 lb (86.637 kg)    Physical Exam  Constitutional: She is oriented to person, place, and time. She appears well-developed and well-nourished. No distress.  HENT:  Head: Normocephalic and atraumatic.  Right Ear: Hearing normal.  Left Ear: Hearing  normal.  Nose: Nose normal.  Eyes: Conjunctivae and lids are normal. Right eye exhibits no discharge. Left eye exhibits no discharge. No scleral icterus.  Cardiovascular: Normal rate, regular rhythm, normal heart sounds and intact distal pulses.  Exam reveals no gallop and no friction rub.   No murmur heard. Pulmonary/Chest: Effort normal and breath sounds normal. No respiratory distress. She has no wheezes. She has no rales. She exhibits no tenderness.  Musculoskeletal: Normal range of motion.  Neurological: She is alert and oriented to person, place, and time.  Skin: Skin is intact. No rash noted.  Psychiatric: She has a normal mood and affect. Her speech is normal and behavior is normal. Judgment and thought content normal. Cognition and memory are normal.  Nursing note and vitals reviewed.   Results for orders placed or performed in visit on 08/12/15  Hepatitis C Antibody  Result Value Ref Range   Hep C Virus Ab <0.1 0.0 - 0.9 s/co ratio  HIV antibody  Result Value Ref Range   HIV Screen 4th Generation wRfx Non Reactive Non Reactive  Microalbumin, Urine Waived  Result Value Ref Range   Microalb, Ur Waived 10 0 - 19 mg/L   Creatinine, Urine Waived 50 10 - 300 mg/dL   Microalb/Creat Ratio 30-300 (H) <30 mg/g  Lipid Panel w/o Chol/HDL Ratio  Result Value Ref Range   Cholesterol, Total 187 100 - 199 mg/dL   Triglycerides 285 (H) 0 - 149 mg/dL   HDL 40 >39 mg/dL   VLDL Cholesterol Cal 57 (H) 5 -  40 mg/dL   LDL Calculated 90 0 - 99 mg/dL  Comprehensive metabolic panel  Result Value Ref Range   Glucose 195 (H) 65 - 99 mg/dL   BUN 12 8 - 27 mg/dL   Creatinine, Ser 0.74 0.57 - 1.00 mg/dL   GFR calc non Af Amer 84 >59 mL/min/1.73   GFR calc Af Amer 97 >59 mL/min/1.73   BUN/Creatinine Ratio 16 11 - 26   Sodium 134 134 - 144 mmol/L   Potassium 4.7 3.5 - 5.2 mmol/L   Chloride 95 (L) 96 - 106 mmol/L   CO2 21 18 - 29 mmol/L   Calcium 9.3 8.7 - 10.3 mg/dL   Total Protein 6.5 6.0 -  8.5 g/dL   Albumin 4.2 3.6 - 4.8 g/dL   Globulin, Total 2.3 1.5 - 4.5 g/dL   Albumin/Globulin Ratio 1.8 1.1 - 2.5   Bilirubin Total 0.4 0.0 - 1.2 mg/dL   Alkaline Phosphatase 143 (H) 39 - 117 IU/L   AST 20 0 - 40 IU/L   ALT 24 0 - 32 IU/L      Assessment & Plan:   Problem List Items Addressed This Visit      Cardiovascular and Mediastinum   Essential hypertension, malignant - Primary    BP elevated again. Will increase her lisinopril back to 5mg  and check back in 1 month. If BP good at cardiologist at the beginning of January, will send her back to work.       Relevant Medications   lisinopril (PRINIVIL,ZESTRIL) 5 MG tablet   Orthostatic hypotension    Feeling better from last visit. Not dizzy any more. BP quite elevated. See HTN discussion.       Relevant Medications   lisinopril (PRINIVIL,ZESTRIL) 5 MG tablet       Follow up plan: Return in about 4 weeks (around 09/24/2015).

## 2015-08-27 NOTE — Assessment & Plan Note (Signed)
Feeling better from last visit. Not dizzy any more. BP quite elevated. See HTN discussion.

## 2015-09-03 ENCOUNTER — Encounter: Payer: Self-pay | Admitting: Family Medicine

## 2015-09-03 ENCOUNTER — Telehealth: Payer: Self-pay

## 2015-09-03 NOTE — Telephone Encounter (Signed)
Patient called, she seen the heart doctor today.  He increased her lisinopril to 10mg . She would like a note so that she can return to work

## 2015-09-03 NOTE — Telephone Encounter (Signed)
Note written. OK for her to pick up and go back to work tomorrow.

## 2015-09-10 LAB — HM DIABETES EYE EXAM

## 2015-09-24 ENCOUNTER — Ambulatory Visit (INDEPENDENT_AMBULATORY_CARE_PROVIDER_SITE_OTHER): Payer: BLUE CROSS/BLUE SHIELD | Admitting: Family Medicine

## 2015-09-24 ENCOUNTER — Encounter: Payer: Self-pay | Admitting: Family Medicine

## 2015-09-24 ENCOUNTER — Telehealth: Payer: Self-pay

## 2015-09-24 VITALS — BP 148/80 | HR 82 | Temp 98.6°F | Ht 63.7 in | Wt 198.0 lb

## 2015-09-24 DIAGNOSIS — Z23 Encounter for immunization: Secondary | ICD-10-CM | POA: Diagnosis not present

## 2015-09-24 DIAGNOSIS — I129 Hypertensive chronic kidney disease with stage 1 through stage 4 chronic kidney disease, or unspecified chronic kidney disease: Secondary | ICD-10-CM | POA: Diagnosis not present

## 2015-09-24 MED ORDER — LISINOPRIL 5 MG PO TABS: 5.0000 mg | ORAL_TABLET | Freq: Every day | ORAL | Status: DC

## 2015-09-24 MED ORDER — FUROSEMIDE 40 MG PO TABS: 40.0000 mg | ORAL_TABLET | Freq: Every day | ORAL | Status: DC

## 2015-09-24 MED ORDER — VENTOLIN HFA 108 (90 BASE) MCG/ACT IN AERS: 1.0000 | INHALATION_SPRAY | RESPIRATORY_TRACT | Status: DC | PRN

## 2015-09-24 MED ORDER — ATORVASTATIN CALCIUM 40 MG PO TABS: 40.0000 mg | ORAL_TABLET | Freq: Every day | ORAL | Status: DC

## 2015-09-24 MED ORDER — EXENATIDE ER 2 MG ~~LOC~~ SRER: 2.0000 mg | SUBCUTANEOUS | Status: DC

## 2015-09-24 MED ORDER — BUDESONIDE-FORMOTEROL FUMARATE 160-4.5 MCG/ACT IN AERO: 2.0000 | INHALATION_SPRAY | Freq: Two times a day (BID) | RESPIRATORY_TRACT | Status: DC

## 2015-09-24 MED ORDER — CARVEDILOL 12.5 MG PO TABS: 12.5000 mg | ORAL_TABLET | Freq: Two times a day (BID) | ORAL | Status: DC

## 2015-09-24 MED ORDER — TIOTROPIUM BROMIDE MONOHYDRATE 18 MCG IN CAPS: 18.0000 ug | ORAL_CAPSULE | Freq: Every day | RESPIRATORY_TRACT | Status: DC

## 2015-09-24 NOTE — Telephone Encounter (Signed)
[facility] is requesting 90 day refills for Symbicort Inh 160/4.5 Atorvastatin 40mg  Tab Carvedilol (IR) Tabs 12.5mg  Spiriva Handihaler Caps 90's 18Mcg Furosemide Tabs 40mg  Ventolin HFA Inh 18Gm w/count 0000000 Trulicity SD Pen [id number] 4's 0.75mg   Lisinopril tabs 5Mg 

## 2015-09-24 NOTE — Progress Notes (Signed)
BP 148/80 mmHg  Pulse 82  Temp(Src) 98.6 F (37 C)  Ht 5' 3.7" (1.618 m)  Wt 198 lb (89.812 kg)  BMI 34.31 kg/m2  SpO2 96%   Subjective:    [name], female    DOB: 1946-02-09, 70 y.o.   MRN: [id number]  HPI: Sarah White is a 70 y.o. female  Chief Complaint  Patient presents with  . Hypertension   HYPERTENSION- tried to take the 10mg  of the lisinopril and it was too strong for her. Got very dizzy and had to to go home. Did not take her lisinopril today because she was out Hypertension status: better  Satisfied with current treatment? yes Duration of hypertension: chronic BP monitoring frequency:  a few times a week BP medication side effects:  yes- orthostatic on 10mg  Medication compliance: good compliance Aspirin: yes Recurrent headaches: no Visual changes: no Palpitations: no Dyspnea: no Chest pain: no Lower extremity edema: no Dizzy/lightheaded: no- was on the 10mg   Relevant past medical, surgical, family and social history reviewed and updated as indicated. Interim medical history since our last visit reviewed. Allergies and medications reviewed and updated.  Review of Systems  Constitutional: Negative.   Respiratory: Negative.   Cardiovascular: Negative.   Psychiatric/Behavioral: Negative.     Per HPI unless specifically indicated above     Objective:    BP 148/80 mmHg  Pulse 82  Temp(Src) 98.6 F (37 C)  Ht 5' 3.7" (1.618 m)  Wt 198 lb (89.812 kg)  BMI 34.31 kg/m2  SpO2 96%  Wt Readings from Last 3 Encounters:  09/24/15 198 lb (89.812 kg)  08/27/15 195 lb (88.451 kg)  08/14/15 193 lb (87.544 kg)    Physical Exam  Constitutional: She is oriented to person, place, and time. She appears well-developed and well-nourished. No distress.  HENT:  Head: Normocephalic and atraumatic.  Right Ear: Hearing normal.  Left Ear: Hearing normal.  Nose: Nose normal.  Eyes: Conjunctivae and lids are normal. Right eye exhibits no discharge.  Left eye exhibits no discharge. No scleral icterus.  Cardiovascular: Normal rate, regular rhythm, normal heart sounds and intact distal pulses.  Exam reveals no gallop.   No murmur heard. Pulmonary/Chest: Effort normal and breath sounds normal. No respiratory distress. She has no wheezes. She has no rales. She exhibits no tenderness.  Musculoskeletal: Normal range of motion.  Neurological: She is alert and oriented to person, place, and time.  Skin: Skin is warm, dry and intact. No rash noted. No erythema. No pallor.  Psychiatric: She has a normal mood and affect. Her speech is normal and behavior is normal. Judgment and thought content normal. Cognition and memory are normal.  Nursing note and vitals reviewed.   Results for orders placed or performed in visit on 08/12/15  Hepatitis C Antibody  Result Value Ref Range   Hep C Virus Ab <0.1 0.0 - 0.9 s/co ratio  HIV antibody  Result Value Ref Range   HIV Screen 4th Generation wRfx Non Reactive Non Reactive  Microalbumin, Urine Waived  Result Value Ref Range   Microalb, Ur Waived 10 0 - 19 mg/L   Creatinine, Urine Waived 50 10 - 300 mg/dL   Microalb/Creat Ratio 30-300 (H) <30 mg/g  Lipid Panel w/o Chol/HDL Ratio  Result Value Ref Range   Cholesterol, Total 187 100 - 199 mg/dL   Triglycerides 285 (H) 0 - 149 mg/dL   HDL 40 >39 mg/dL   VLDL Cholesterol Cal 57 (H) 5 -  40 mg/dL   LDL Calculated 90 0 - 99 mg/dL  Comprehensive metabolic panel  Result Value Ref Range   Glucose 195 (H) 65 - 99 mg/dL   BUN 12 8 - 27 mg/dL   Creatinine, Ser 0.74 0.57 - 1.00 mg/dL   GFR calc non Af Amer 84 >59 mL/min/1.73   GFR calc Af Amer 97 >59 mL/min/1.73   BUN/Creatinine Ratio 16 11 - 26   Sodium 134 134 - 144 mmol/L   Potassium 4.7 3.5 - 5.2 mmol/L   Chloride 95 (L) 96 - 106 mmol/L   CO2 21 18 - 29 mmol/L   Calcium 9.3 8.7 - 10.3 mg/dL   Total Protein 6.5 6.0 - 8.5 g/dL   Albumin 4.2 3.6 - 4.8 g/dL   Globulin, Total 2.3 1.5 - 4.5 g/dL    Albumin/Globulin Ratio 1.8 1.1 - 2.5   Bilirubin Total 0.4 0.0 - 1.2 mg/dL   Alkaline Phosphatase 143 (H) 39 - 117 IU/L   AST 20 0 - 40 IU/L   ALT 24 0 - 32 IU/L      Assessment & Plan:   Problem List Items Addressed This Visit      Genitourinary   Hypertensive renal disease    Will take her medicine and return for recheck on BP in 1 month when she is due for her diabetes follow up.       Other Visit Diagnoses    Immunization due    -  Primary    Tdap given today.    Relevant Orders    Tdap vaccine greater than or equal to 7yo IM (Completed)        Follow up plan: Return in about 4 weeks (around 10/22/2015) for Diabetes.

## 2015-09-24 NOTE — Telephone Encounter (Signed)
Done at her appointment today.

## 2015-09-24 NOTE — Patient Instructions (Signed)
Tdap Vaccine (Tetanus, Diphtheria and Pertussis): What You Need to Know 1. Why get vaccinated? Tetanus, diphtheria and pertussis are very serious diseases. Tdap vaccine can protect us from these diseases. And, Tdap vaccine given to pregnant women can protect newborn babies against pertussis. TETANUS (Lockjaw) is rare in the [place] today. It causes painful muscle tightening and stiffness, usually all over the body.  It can lead to tightening of muscles in the head and neck so you can't open your mouth, swallow, or sometimes even breathe. Tetanus kills about 1 out of 10 people who are infected even after receiving the best medical care. DIPHTHERIA is also rare in the [place] today. It can cause a thick coating to form in the back of the throat.  It can lead to breathing problems, heart failure, paralysis, and death. PERTUSSIS (Whooping Cough) causes severe coughing spells, which can cause difficulty breathing, vomiting and disturbed sleep.  It can also lead to weight loss, incontinence, and rib fractures. Up to 2 in 100 adolescents and 5 in 100 adults with pertussis are hospitalized or have complications, which could include pneumonia or death. These diseases are caused by bacteria. Diphtheria and pertussis are spread from person to person through secretions from coughing or sneezing. Tetanus enters the body through cuts, scratches, or wounds. Before vaccines, as many as 200,000 cases of diphtheria, 200,000 cases of pertussis, and hundreds of cases of tetanus, were reported in the [place] each year. Since vaccination began, reports of cases for tetanus and diphtheria have dropped by about 99% and for pertussis by about 80%. 2. Tdap vaccine Tdap vaccine can protect adolescents and adults from tetanus, diphtheria, and pertussis. One dose of Tdap is routinely given at age 11 or 12. People who did not get Tdap at that age should get it as soon as possible. Tdap is especially important  for healthcare professionals and anyone having close contact with a baby younger than 12 months. Pregnant women should get a dose of Tdap during every pregnancy, to protect the newborn from pertussis. Infants are most at risk for severe, life-threatening complications from pertussis. Another vaccine, called Td, protects against tetanus and diphtheria, but not pertussis. A Td booster should be given every 10 years. Tdap may be given as one of these boosters if you have never gotten Tdap before. Tdap may also be given after a severe cut or burn to prevent tetanus infection. Your doctor or the person giving you the vaccine can give you more information. Tdap may safely be given at the same time as other vaccines. 3. Some people should not get this vaccine  A person who has ever had a life-threatening allergic reaction after a previous dose of any diphtheria, tetanus or pertussis containing vaccine, OR has a severe allergy to any part of this vaccine, should not get Tdap vaccine. Tell the person giving the vaccine about any severe allergies.  Anyone who had coma or long repeated seizures within 7 days after a childhood dose of DTP or DTaP, or a previous dose of Tdap, should not get Tdap, unless a cause other than the vaccine was found. They can still get Td.  Talk to your doctor if you:  have seizures or another nervous system problem,  had severe pain or swelling after any vaccine containing diphtheria, tetanus or pertussis,  ever had a condition called Guillain-Barr Syndrome (GBS),  aren't feeling well on the day the shot is scheduled. 4. Risks With any medicine, including vaccines, there is   a chance of side effects. These are usually mild and go away on their own. Serious reactions are also possible but are rare. Most people who get Tdap vaccine do not have any problems with it. Mild problems following Tdap (Did not interfere with activities)  Pain where the shot was given (about 3 in 4  adolescents or 2 in 3 adults)  Redness or swelling where the shot was given (about 1 person in 5)  Mild fever of at least 100.4F (up to about 1 in 25 adolescents or 1 in 100 adults)  Headache (about 3 or 4 people in 10)  Tiredness (about 1 person in 3 or 4)  Nausea, vomiting, diarrhea, stomach ache (up to 1 in 4 adolescents or 1 in 10 adults)  Chills, sore joints (about 1 person in 10)  Body aches (about 1 person in 3 or 4)  Rash, swollen glands (uncommon) Moderate problems following Tdap (Interfered with activities, but did not require medical attention)  Pain where the shot was given (up to 1 in 5 or 6)  Redness or swelling where the shot was given (up to about 1 in 16 adolescents or 1 in 12 adults)  Fever over 102F (about 1 in 100 adolescents or 1 in 250 adults)  Headache (about 1 in 7 adolescents or 1 in 10 adults)  Nausea, vomiting, diarrhea, stomach ache (up to 1 or 3 people in 100)  Swelling of the entire arm where the shot was given (up to about 1 in 500). Severe problems following Tdap (Unable to perform usual activities; required medical attention)  Swelling, severe pain, bleeding and redness in the arm where the shot was given (rare). Problems that could happen after any vaccine:  People sometimes faint after a medical procedure, including vaccination. Sitting or lying down for about 15 minutes can help prevent fainting, and injuries caused by a fall. Tell your doctor if you feel dizzy, or have vision changes or ringing in the ears.  Some people get severe pain in the shoulder and have difficulty moving the arm where a shot was given. This happens very rarely.  Any medication can cause a severe allergic reaction. Such reactions from a vaccine are very rare, estimated at fewer than 1 in a million doses, and would happen within a few minutes to a few hours after the vaccination. As with any medicine, there is a very remote chance of a vaccine causing a serious  injury or death. The safety of vaccines is always being monitored. For more information, visit: www.cdc.gov/vaccinesafety/ 5. What if there is a serious problem? What should I look for?  Look for anything that concerns you, such as signs of a severe allergic reaction, very high fever, or unusual behavior.  Signs of a severe allergic reaction can include hives, swelling of the face and throat, difficulty breathing, a fast heartbeat, dizziness, and weakness. These would usually start a few minutes to a few hours after the vaccination. What should I do?  If you think it is a severe allergic reaction or other emergency that can't wait, call 9-1-1 or get the person to the nearest hospital. Otherwise, call your doctor.  Afterward, the reaction should be reported to the Vaccine Adverse Event Reporting System (VAERS). Your doctor might file this report, or you can do it yourself through the VAERS web site at www.vaers.hhs.gov, or by calling 1-800-822-7967. VAERS does not give medical advice.  6. The National Vaccine Injury Compensation Program The National Vaccine Injury Compensation Program (  VICP) is a federal program that was created to compensate people who may have been injured by certain vaccines. Persons who believe they may have been injured by a vaccine can learn about the program and about filing a claim by calling 1-800-338-2382 or visiting the VICP website at www.hrsa.gov/vaccinecompensation. There is a time limit to file a claim for compensation. 7. How can I learn more?  Ask your doctor. He or she can give you the vaccine package insert or suggest other sources of information.  Call your local or state health department.  Contact the Centers for Disease Control and Prevention (CDC):  Call 1-800-232-4636 (1-800-CDC-INFO) or  Visit CDC's website at www.cdc.gov/vaccines CDC Tdap Vaccine VIS (10/24/13)   This information is not intended to replace advice given to you by your health care  provider. Make sure you discuss any questions you have with your health care provider.   Document Released: 02/16/2012 Document Revised: 09/07/2014 Document Reviewed: 11/29/2013 Elsevier Interactive Patient Education 2016 Elsevier Inc.  

## 2015-09-24 NOTE — Assessment & Plan Note (Signed)
Will take her medicine and return for recheck on BP in 1 month when she is due for her diabetes follow up.

## 2015-10-03 ENCOUNTER — Telehealth: Payer: Self-pay

## 2015-10-03 NOTE — Telephone Encounter (Signed)
Called patients house, left a message for patient to return my call.

## 2015-10-03 NOTE — Telephone Encounter (Signed)
I think we put her on victoza instead, can we check with patient?

## 2015-10-03 NOTE — Telephone Encounter (Signed)
Received a RX request from [facility] Trulicity [id number] ? I do not see this in the patients medication list, unsure if she is currently taking it

## 2015-10-04 NOTE — Telephone Encounter (Signed)
Pt called and stated she was taking bydureon.says its not as good as trulicity but that's what they offered/sent.

## 2015-10-16 ENCOUNTER — Ambulatory Visit
Admission: RE | Admit: 2015-10-16 | Discharge: 2015-10-16 | Disposition: A | Discharge status: Home / Self Care | Payer: BLUE CROSS/BLUE SHIELD | Source: Ambulatory Visit | Attending: Family Medicine | Admitting: Family Medicine

## 2015-10-16 ENCOUNTER — Ambulatory Visit (INDEPENDENT_AMBULATORY_CARE_PROVIDER_SITE_OTHER): Payer: BLUE CROSS/BLUE SHIELD | Admitting: Family Medicine

## 2015-10-16 ENCOUNTER — Encounter: Payer: Self-pay | Admitting: Family Medicine

## 2015-10-16 ENCOUNTER — Telehealth: Payer: Self-pay | Admitting: Family Medicine

## 2015-10-16 VITALS — BP 171/88 | HR 94 | Temp 98.8°F | Ht 63.4 in | Wt 197.0 lb

## 2015-10-16 DIAGNOSIS — R0602 Shortness of breath: Secondary | ICD-10-CM

## 2015-10-16 DIAGNOSIS — I129 Hypertensive chronic kidney disease with stage 1 through stage 4 chronic kidney disease, or unspecified chronic kidney disease: Secondary | ICD-10-CM

## 2015-10-16 DIAGNOSIS — J441 Chronic obstructive pulmonary disease with (acute) exacerbation: Secondary | ICD-10-CM

## 2015-10-16 DIAGNOSIS — R05 Cough: Secondary | ICD-10-CM | POA: Diagnosis not present

## 2015-10-16 MED ORDER — HYDROCOD POLST-CPM POLST ER 10-8 MG/5ML PO SUER: 5.0000 mL | Freq: Every evening | ORAL | Status: DC | PRN

## 2015-10-16 MED ORDER — LEVOFLOXACIN 500 MG PO TABS
500.0000 mg | ORAL_TABLET | Freq: Every day | ORAL | Status: DC
Start: 2015-10-16 — End: 2015-10-31

## 2015-10-16 MED ORDER — BENZONATATE 200 MG PO CAPS: 200.0000 mg | ORAL_CAPSULE | Freq: Three times a day (TID) | ORAL | Status: DC | PRN

## 2015-10-16 MED ORDER — PREDNISONE 10 MG PO TABS: ORAL_TABLET | ORAL | Status: DC

## 2015-10-16 MED ORDER — ALBUTEROL SULFATE (2.5 MG/3ML) 0.083% IN NEBU
2.5000 mg | INHALATION_SOLUTION | Freq: Four times a day (QID) | RESPIRATORY_TRACT | Status: DC | PRN
Start: 2015-10-16 — End: 2017-05-25

## 2015-10-16 NOTE — Progress Notes (Signed)
BP 171/88 mmHg  Pulse 94  Temp(Src) 98.8 F (37.1 C)  Ht 5' 3.4" (1.61 m)  Wt 197 lb (89.359 kg)  BMI 34.47 kg/m2  SpO2 90%   Subjective:    [name], female    DOB: [birth year], 70 y.o.   MRN: [id number]  HPI: Sarah White is a 70 y.o. female  Chief Complaint  Patient presents with  . Shortness of Breath   UPPER RESPIRATORY TRACT INFECTION Duration: 2 days Worst symptom: coughing, SOB Fever: no Cough: yes Shortness of breath: yes Wheezing: no Chest pain: yes, with cough Chest tightness: yes Chest congestion: no Nasal congestion: yes Runny nose: no Post nasal drip: yes Sneezing: yes Sore throat: yes Swollen glands: no Sinus pressure: no Headache: yes Face pain: no Toothache: no Ear pain: no  Ear pressure: no  Eyes red/itching:no Eye drainage/crusting: no  Vomiting: no Rash: no Fatigue: yes Sick contacts: yes Strep contacts: no  Context: worse Recurrent sinusitis: no Relief with OTC cold/cough medications: no  Treatments attempted: cold/sinus    Relevant past medical, surgical, family and social history reviewed and updated as indicated. Interim medical history since our last visit reviewed. Allergies and medications reviewed and updated.  Review of Systems  Constitutional: Negative.   HENT: Positive for congestion, postnasal drip, rhinorrhea and sinus pressure. Negative for dental problem, drooling, ear discharge, ear pain, facial swelling, hearing loss, mouth sores, nosebleeds, sneezing, sore throat, tinnitus, trouble swallowing and voice change.   Respiratory: Positive for cough, chest tightness and shortness of breath. Negative for apnea, choking, wheezing and stridor.   Cardiovascular: Negative.   Psychiatric/Behavioral: Negative.     Per HPI unless specifically indicated above     Objective:    BP 171/88 mmHg  Pulse 94  Temp(Src) 98.8 F (37.1 C)  Ht 5' 3.4" (1.61 m)  Wt 197 lb (89.359 kg)  BMI 34.47 kg/m2  SpO2 90%   Wt Readings from Last 3 Encounters:  10/16/15 197 lb (89.359 kg)  09/24/15 198 lb (89.812 kg)  08/27/15 195 lb (88.451 kg)    Physical Exam  Constitutional: She is oriented to person, place, and time. She appears well-developed and well-nourished. No distress.  HENT:  Head: Normocephalic and atraumatic.  Right Ear: Hearing normal.  Left Ear: Hearing normal.  Nose: Nose normal.  Eyes: Conjunctivae and lids are normal. Right eye exhibits no discharge. Left eye exhibits no discharge. No scleral icterus.  Cardiovascular: Normal rate, regular rhythm and normal heart sounds.  Exam reveals no gallop and no friction rub.   No murmur heard. Pulmonary/Chest: Effort normal. No respiratory distress. She has decreased breath sounds in the right upper field, the right middle field, the right lower field, the left upper field, the left middle field and the left lower field. She has no wheezes. She has no rhonchi. She has no rales. She exhibits no tenderness.  Musculoskeletal: Normal range of motion.  Neurological: She is alert and oriented to person, place, and time.  Skin: Skin is intact. No rash noted.  Psychiatric: She has a normal mood and affect. Her speech is normal and behavior is normal. Judgment and thought content normal. Cognition and memory are normal.  Nursing note and vitals reviewed.   Results for orders placed or performed in visit on 10/01/15  HM DIABETES EYE EXAM  Result Value Ref Range   HM Diabetic Eye Exam No Retinopathy No Retinopathy      Assessment & Plan:   Problem List Items  Addressed This Visit      Respiratory   COPD exacerbation ([place]) - Primary    Will treat with 12 day taper of prednisone and levaquin as she was just in the hospital about a month ago. Tessalon and tussionex for comfort. To go get x-ray today. Warning signs for which she should go to ER discussed. Return for recheck on lungs in 2 days.       Relevant Medications   albuterol (PROVENTIL) (2.5  MG/3ML) 0.083% nebulizer solution   predniSONE (DELTASONE) 10 MG tablet   benzonatate (TESSALON) 200 MG capsule   chlorpheniramine-HYDROcodone (TUSSIONEX PENNKINETIC ER) 10-8 MG/5ML SUER     Genitourinary   Hypertensive renal disease    Stopped her lisinopril. Will consider restarting it in a couple of months when she retires. Refuses it now.        Other Visit Diagnoses    Shortness of breath        Not significantly better with neb, low risk for PE. Will obtain CXR and treat for COPD exacerbation. Discussed warning signs to go to ER.    Relevant Medications    albuterol (PROVENTIL) (2.5 MG/3ML) 0.083% nebulizer solution    Other Relevant Orders    PR DEMO &/OR EVAL,PT USE,AEROSOL DEVICE    DG Chest 2 View        Follow up plan: Return Friday.

## 2015-10-16 NOTE — Assessment & Plan Note (Signed)
Stopped her lisinopril. Will consider restarting it in a couple of months when she retires. Refuses it now.

## 2015-10-16 NOTE — Telephone Encounter (Signed)
Called to give Sarah White her results. Looks like bronchitis. Will take her medicine and recheck on Friday. Continue to monitor closely.

## 2015-10-16 NOTE — Assessment & Plan Note (Signed)
Will treat with 12 day taper of prednisone and levaquin as she was just in the hospital about a month ago. Tessalon and tussionex for comfort. To go get x-ray today. Warning signs for which she should go to ER discussed. Return for recheck on lungs in 2 days.

## 2015-10-16 NOTE — Telephone Encounter (Signed)
Pt would like to have tessalon, levaquin and predisone called in to [facility] [address] [facility]

## 2015-10-17 NOTE — Telephone Encounter (Signed)
Called into pharmacy

## 2015-10-17 NOTE — Telephone Encounter (Signed)
Will call prescriptions in when the pharmacy opens

## 2015-10-18 ENCOUNTER — Ambulatory Visit (INDEPENDENT_AMBULATORY_CARE_PROVIDER_SITE_OTHER): Payer: BLUE CROSS/BLUE SHIELD | Admitting: Family Medicine

## 2015-10-18 ENCOUNTER — Encounter: Payer: Self-pay | Admitting: Unknown Physician Specialty

## 2015-10-18 VITALS — BP 166/92 | HR 95 | Temp 98.3°F | Ht 63.3 in | Wt 195.7 lb

## 2015-10-18 DIAGNOSIS — I129 Hypertensive chronic kidney disease with stage 1 through stage 4 chronic kidney disease, or unspecified chronic kidney disease: Secondary | ICD-10-CM | POA: Diagnosis not present

## 2015-10-18 DIAGNOSIS — J441 Chronic obstructive pulmonary disease with (acute) exacerbation: Secondary | ICD-10-CM | POA: Diagnosis not present

## 2015-10-18 NOTE — Assessment & Plan Note (Signed)
Oxygen up a bit. Lungs moving some air. Continue current regimen and check back in 2 weeks. She is aware of warning signs for which she should go to the ER.

## 2015-10-18 NOTE — Progress Notes (Signed)
BP 166/92 mmHg  Pulse 95  Temp(Src) 98.3 F (36.8 C)  Ht 5' 3.3" (1.608 m)  Wt 195 lb 11.2 oz (88.769 kg)  BMI 34.33 kg/m2  SpO2 91%   Subjective:    [name], female    DOB: 1945/09/09, 70 y.o.   MRN: [id number]  HPI: Sarah White is a 70 y.o. female  Chief Complaint  Patient presents with  . lung recheck    pt states she is here for a lung recheck   Lungs doing much better. Only got her medicine last night, so has only taken 1 dose of each so far. She is feeling better. No fevers. No chills. No other concerns at this time.   Relevant past medical, surgical, family and social history reviewed and updated as indicated. Interim medical history since our last visit reviewed. Allergies and medications reviewed and updated.  Review of Systems  Constitutional: Negative.   Respiratory: Negative.   Cardiovascular: Negative.   Psychiatric/Behavioral: Negative.     Per HPI unless specifically indicated above     Objective:    BP 166/92 mmHg  Pulse 95  Temp(Src) 98.3 F (36.8 C)  Ht 5' 3.3" (1.608 m)  Wt 195 lb 11.2 oz (88.769 kg)  BMI 34.33 kg/m2  SpO2 91%  Wt Readings from Last 3 Encounters:  10/18/15 195 lb 11.2 oz (88.769 kg)  10/16/15 197 lb (89.359 kg)  09/24/15 198 lb (89.812 kg)    Physical Exam  Constitutional: She is oriented to person, place, and time. She appears well-developed and well-nourished. No distress.  HENT:  Head: Normocephalic and atraumatic.  Right Ear: Hearing normal.  Left Ear: Hearing normal.  Nose: Nose normal.  Eyes: Conjunctivae and lids are normal. Right eye exhibits no discharge. Left eye exhibits no discharge. No scleral icterus.  Cardiovascular: Normal rate, regular rhythm, normal heart sounds and intact distal pulses.  Exam reveals no gallop and no friction rub.   No murmur heard. Pulmonary/Chest: Effort normal. No respiratory distress. She has decreased breath sounds (better than last visit). She has no wheezes.  She has no rales. She exhibits no tenderness.  Musculoskeletal: Normal range of motion.  Neurological: She is alert and oriented to person, place, and time.  Skin: Skin is warm, dry and intact. No rash noted. No erythema. No pallor.  Psychiatric: She has a normal mood and affect. Her speech is normal and behavior is normal. Judgment and thought content normal. Cognition and memory are normal.  Nursing note and vitals reviewed.   Results for orders placed or performed in visit on 10/01/15  HM DIABETES EYE EXAM  Result Value Ref Range   HM Diabetic Eye Exam No Retinopathy No Retinopathy      Assessment & Plan:   Problem List Items Addressed This Visit      Respiratory   COPD exacerbation ([place])    Oxygen up a bit. Lungs moving some air. Continue current regimen and check back in 2 weeks. She is aware of warning signs for which she should go to the ER.        Genitourinary   Hypertensive renal disease - Primary    BP quite elevated, likely due to prednisone. Recheck at follow up in 2 weeks.           Follow up plan: Return in about 2 weeks (around 11/01/2015).

## 2015-10-18 NOTE — Assessment & Plan Note (Signed)
BP quite elevated, likely due to prednisone. Recheck at follow up in 2 weeks.

## 2015-10-22 ENCOUNTER — Ambulatory Visit: Payer: BLUE CROSS/BLUE SHIELD | Admitting: Family Medicine

## 2015-10-31 ENCOUNTER — Encounter: Payer: Self-pay | Admitting: Family Medicine

## 2015-10-31 ENCOUNTER — Ambulatory Visit (INDEPENDENT_AMBULATORY_CARE_PROVIDER_SITE_OTHER): Payer: BLUE CROSS/BLUE SHIELD | Admitting: Family Medicine

## 2015-10-31 VITALS — BP 134/74 | HR 86 | Temp 98.5°F | Ht 63.3 in | Wt 190.0 lb

## 2015-10-31 DIAGNOSIS — B3781 Candidal esophagitis: Secondary | ICD-10-CM

## 2015-10-31 DIAGNOSIS — I129 Hypertensive chronic kidney disease with stage 1 through stage 4 chronic kidney disease, or unspecified chronic kidney disease: Secondary | ICD-10-CM

## 2015-10-31 DIAGNOSIS — E1165 Type 2 diabetes mellitus with hyperglycemia: Secondary | ICD-10-CM | POA: Diagnosis not present

## 2015-10-31 DIAGNOSIS — IMO0001 Reserved for inherently not codable concepts without codable children: Secondary | ICD-10-CM

## 2015-10-31 DIAGNOSIS — B370 Candidal stomatitis: Secondary | ICD-10-CM | POA: Diagnosis not present

## 2015-10-31 DIAGNOSIS — J441 Chronic obstructive pulmonary disease with (acute) exacerbation: Secondary | ICD-10-CM | POA: Diagnosis not present

## 2015-10-31 LAB — BAYER DCA HB A1C WAIVED: HB A1C: 11.2 % — AB (ref <7.0)

## 2015-10-31 MED ORDER — FLUCONAZOLE 100 MG PO TABS: 100.0000 mg | ORAL_TABLET | Freq: Every day | ORAL | Status: DC

## 2015-10-31 MED ORDER — METFORMIN HCL ER 500 MG PO TB24: 500.0000 mg | ORAL_TABLET | Freq: Every day | ORAL | Status: DC

## 2015-10-31 NOTE — Assessment & Plan Note (Signed)
Not under good control. Likely due to her prednisone. Will try her back on metformin to see how she does and recheck in a couple of days to make sure it's not giving her side effects.

## 2015-10-31 NOTE — Progress Notes (Signed)
BP 134/74 mmHg  Pulse 86  Temp(Src) 98.5 F (36.9 C)  Ht 5' 3.3" (1.608 m)  Wt 190 lb (86.183 kg)  BMI 33.33 kg/m2  SpO2 95%   Subjective:    [name], female    DOB: [birth year], 70 y.o.   MRN: [id number]  HPI: Sarah White is a 70 y.o. female  Chief Complaint  Patient presents with  . Hypertension  . lung recheck   Lungs have been feeling a lot better. Has thrush for about a week.  HYPERTENSION Hypertension status: better  Satisfied with current treatment? yes Duration of hypertension: chronic BP monitoring frequency:  not checking BP medication side effects:  no Medication compliance: excellent compliance Aspirin: yes Recurrent headaches: no Visual changes: no Palpitations: no Dyspnea: no Chest pain: no Lower extremity edema: no Dizzy/lightheaded: no  DIABETES Hypoglycemic episodes:no Polydipsia/polyuria: yes Visual disturbance: no Chest pain: no Paresthesias: no Glucose Monitoring: yes  Accucheck frequency: Daily- really high with the prednisone Taking Insulin?: no Blood Pressure Monitoring: not checking Retinal Examination: Up to Date Foot Exam: Up to Date Diabetic Education: Completed Pneumovax: Up to Date Influenza: Up to Date Aspirin: yes   Relevant past medical, surgical, family and social history reviewed and updated as indicated. Interim medical history since our last visit reviewed. Allergies and medications reviewed and updated.  Review of Systems  Constitutional: Negative.   Respiratory: Negative.   Cardiovascular: Negative.   Psychiatric/Behavioral: Negative.     Per HPI unless specifically indicated above     Objective:    BP 134/74 mmHg  Pulse 86  Temp(Src) 98.5 F (36.9 C)  Ht 5' 3.3" (1.608 m)  Wt 190 lb (86.183 kg)  BMI 33.33 kg/m2  SpO2 95%  Wt Readings from Last 3 Encounters:  10/31/15 190 lb (86.183 kg)  10/18/15 195 lb 11.2 oz (88.769 kg)  10/16/15 197 lb (89.359 kg)    Physical Exam    Constitutional: She is oriented to person, place, and time. She appears well-developed and well-nourished. No distress.  HENT:  Head: Normocephalic and atraumatic.  Right Ear: Hearing and external ear normal.  Left Ear: Hearing and external ear normal.  Nose: Nose normal.  Mouth/Throat: Oropharynx is clear and moist. No oropharyngeal exudate.    Eyes: Conjunctivae, EOM and lids are normal. Pupils are equal, round, and reactive to light. Right eye exhibits no discharge. Left eye exhibits no discharge. No scleral icterus.  Cardiovascular: Normal rate, regular rhythm, normal heart sounds and intact distal pulses.  Exam reveals no gallop and no friction rub.   No murmur heard. Pulmonary/Chest: Effort normal and breath sounds normal. No respiratory distress. She has no wheezes. She has no rales. She exhibits no tenderness.  Musculoskeletal: Normal range of motion.  Neurological: She is alert and oriented to person, place, and time.  Skin: Skin is warm, dry and intact. No rash noted. She is not diaphoretic. No erythema. No pallor.  Psychiatric: She has a normal mood and affect. Her speech is normal and behavior is normal. Judgment and thought content normal. Cognition and memory are normal.  Nursing note and vitals reviewed.   Results for orders placed or performed in visit on 10/01/15  HM DIABETES EYE EXAM  Result Value Ref Range   HM Diabetic Eye Exam No Retinopathy No Retinopathy      Assessment & Plan:   Problem List Items Addressed This Visit      Respiratory   COPD exacerbation ([place])    Resolved.  Continue regular medicine. Continue to monitor.         Endocrine   Diabetes mellitus type 2, uncontrolled ([place]) - Primary    Not under good control. Likely due to her prednisone. Will try her back on metformin to see how she does and recheck in a couple of days to make sure it's not giving her side effects.       Relevant Medications   metFORMIN (GLUCOPHAGE-XR) 500 MG 24 hr  tablet   Other Relevant Orders   Bayer DCA Hb A1c Waived   Comprehensive metabolic panel     Genitourinary   Hypertensive renal disease    Under better control on recheck. Continue current regimen. Continue to monitor.        Other Visit Diagnoses    Thrush of mouth and esophagus ([place])        Will treat with diflucan. Call if not getting better or getting worse.    Relevant Medications    fluconazole (DIFLUCAN) 100 MG tablet        Follow up plan: Return in about 3 months (around 01/31/2016).

## 2015-10-31 NOTE — Assessment & Plan Note (Signed)
Resolved. Continue regular medicine. Continue to monitor.

## 2015-10-31 NOTE — Assessment & Plan Note (Signed)
Under better control on recheck. Continue current regimen. Continue to monitor.

## 2015-11-01 ENCOUNTER — Encounter: Payer: Self-pay | Admitting: Family Medicine

## 2015-11-01 LAB — COMPREHENSIVE METABOLIC PANEL
A/G RATIO: 1.8 (ref 1.1–2.5)
ALBUMIN: 3.9 g/dL (ref 3.6–4.8)
ALT: 17 IU/L (ref 0–32)
AST: 11 IU/L (ref 0–40)
Alkaline Phosphatase: 119 IU/L — ABNORMAL HIGH (ref 39–117)
BUN / CREAT RATIO: 18 (ref 11–26)
BUN: 12 mg/dL (ref 8–27)
Bilirubin Total: 0.5 mg/dL (ref 0.0–1.2)
CALCIUM: 8.7 mg/dL (ref 8.7–10.3)
CO2: 25 mmol/L (ref 18–29)
CREATININE: 0.68 mg/dL (ref 0.57–1.00)
Chloride: 95 mmol/L — ABNORMAL LOW (ref 96–106)
GFR, EST AFRICAN AMERICAN: 105 mL/min/{1.73_m2} (ref >59)
GFR, EST NON AFRICAN AMERICAN: 91 mL/min/{1.73_m2} (ref >59)
GLOBULIN, TOTAL: 2.2 g/dL (ref 1.5–4.5)
Glucose: 351 mg/dL — ABNORMAL HIGH (ref 65–99)
POTASSIUM: 4 mmol/L (ref 3.5–5.2)
SODIUM: 135 mmol/L (ref 134–144)
Total Protein: 6.1 g/dL (ref 6.0–8.5)

## 2015-11-04 ENCOUNTER — Telehealth: Payer: Self-pay | Admitting: Family Medicine

## 2015-11-04 ENCOUNTER — Other Ambulatory Visit: Payer: Self-pay | Admitting: Family Medicine

## 2015-11-04 MED ORDER — CANAGLIFLOZIN 100 MG PO TABS: 100.0000 mg | ORAL_TABLET | Freq: Every day | ORAL | Status: DC

## 2015-11-04 NOTE — Telephone Encounter (Signed)
Called to check in on how she did with the metformin over the weekend. If she did well, continue current regimen and increase to 1 tab BID next weekend.

## 2015-11-04 NOTE — Telephone Encounter (Signed)
Pt called back and stated that she couldn't take that medication because it made her sick and would like you to call back

## 2015-11-04 NOTE — Telephone Encounter (Signed)
Called and spoke to [place]. She is not able to take the metformin. She states that it made her feel nauseated and dizzy. She doesn't want to give it a try. She would be willing to try something else. Rx for invokana sent to her pharmacy. Check back in next visit.

## 2015-11-04 NOTE — Telephone Encounter (Signed)
-----   Message from Valerie Roys, [place] sent at 10/31/2015 12:08 PM EST ----- Call to check in on how metformin is treating her.

## 2015-11-18 ENCOUNTER — Telehealth: Payer: Self-pay | Admitting: Family Medicine

## 2015-11-18 MED ORDER — DULAGLUTIDE 0.75 MG/0.5ML ~~LOC~~ SOAJ: 0.7500 mg | SUBCUTANEOUS | Status: DC

## 2015-11-18 NOTE — Telephone Encounter (Signed)
She's on bydureon now. She can't be on both. If she wants to change to trulicity, let me know, but otherwise she can continue on her bydureon and her invokanna

## 2015-11-18 NOTE — Telephone Encounter (Signed)
[facility] and she would like to have it called/faxed in.

## 2015-11-18 NOTE — Telephone Encounter (Signed)
[facility]  Patient wants to go back on Trulicity, the bydureon was not working.

## 2015-11-18 NOTE — Telephone Encounter (Signed)
Rx sent to her pharmacy 

## 2015-11-18 NOTE — Telephone Encounter (Signed)
Dr.Johnson, was trulicity discontinued with this patient. I seen in the telephone note that she was given invokana at the beginning of this month

## 2015-11-26 ENCOUNTER — Ambulatory Visit: Payer: BLUE CROSS/BLUE SHIELD | Admitting: Pulmonary Disease

## 2015-12-20 ENCOUNTER — Telehealth: Payer: Self-pay

## 2015-12-20 MED ORDER — PREDNISONE 10 MG PO TABS: ORAL_TABLET | ORAL | Status: DC

## 2015-12-20 NOTE — Telephone Encounter (Signed)
Patient notified

## 2015-12-20 NOTE — Telephone Encounter (Signed)
Patient called, she thinks that she has bronchitis again, she is having a hard time catching her breath. She is not able to get an appointment with [place] until Wednesday.

## 2015-12-20 NOTE — Telephone Encounter (Signed)
Prednisone sent to her pharmacy. If getting worse over weekend, go to the [facility]

## 2015-12-25 ENCOUNTER — Encounter: Payer: Self-pay | Admitting: Family Medicine

## 2015-12-25 ENCOUNTER — Ambulatory Visit (INDEPENDENT_AMBULATORY_CARE_PROVIDER_SITE_OTHER): Payer: BLUE CROSS/BLUE SHIELD | Admitting: Family Medicine

## 2015-12-25 VITALS — BP 174/108 | HR 91 | Temp 98.6°F | Wt 190.0 lb

## 2015-12-25 DIAGNOSIS — J449 Chronic obstructive pulmonary disease, unspecified: Secondary | ICD-10-CM

## 2015-12-25 DIAGNOSIS — R0602 Shortness of breath: Secondary | ICD-10-CM | POA: Diagnosis not present

## 2015-12-25 DIAGNOSIS — G4700 Insomnia, unspecified: Secondary | ICD-10-CM

## 2015-12-25 DIAGNOSIS — J441 Chronic obstructive pulmonary disease with (acute) exacerbation: Secondary | ICD-10-CM

## 2015-12-25 DIAGNOSIS — R7981 Abnormal blood-gas level: Secondary | ICD-10-CM | POA: Diagnosis not present

## 2015-12-25 DIAGNOSIS — I129 Hypertensive chronic kidney disease with stage 1 through stage 4 chronic kidney disease, or unspecified chronic kidney disease: Secondary | ICD-10-CM | POA: Diagnosis not present

## 2015-12-25 MED ORDER — TRAZODONE HCL 50 MG PO TABS: 25.0000 mg | ORAL_TABLET | Freq: Every evening | ORAL | Status: DC | PRN

## 2015-12-25 MED ORDER — HYDROCOD POLST-CPM POLST ER 10-8 MG/5ML PO SUER: 5.0000 mL | Freq: Every evening | ORAL | Status: DC | PRN

## 2015-12-25 MED ORDER — AZITHROMYCIN 250 MG PO TABS
ORAL_TABLET | ORAL | Status: DC
Start: 2015-12-25 — End: 2016-01-02

## 2015-12-25 MED ORDER — BENZONATATE 200 MG PO CAPS: ORAL_CAPSULE | ORAL | Status: DC

## 2015-12-25 MED ORDER — PREDNISONE 10 MG PO TABS: ORAL_TABLET | ORAL | Status: DC

## 2015-12-25 MED ORDER — LISINOPRIL 10 MG PO TABS: 10.0000 mg | ORAL_TABLET | Freq: Every day | ORAL | Status: DC

## 2015-12-25 NOTE — Assessment & Plan Note (Signed)
Increase lisinopril to 10mg . Recheck 1 week. Call with concerns.

## 2015-12-25 NOTE — Progress Notes (Signed)
BP 174/108 mmHg  Pulse 91  Temp(Src) 98.6 F (37 C)  Wt 190 lb (86.183 kg)  SpO2 97%   Subjective:    [name], female    DOB: [birth year], 70 y.o.   MRN: [id number]  HPI: Sarah White is a 70 y.o. female  Chief Complaint  Patient presents with  . Shortness of Breath   UPPER RESPIRATORY TRACT INFECTION Duration: 1 week Worst symptom: SOB, coughing Fever: no Cough: yes Shortness of breath: yes Wheezing: yes Chest pain: yes, with cough Chest tightness: yes Chest congestion: yes Nasal congestion: yes Runny nose: yes Post nasal drip: yes Sneezing: yes Sore throat: no Swollen glands: no Sinus pressure: no Headache: yes Face pain: yes Toothache: no Ear pain: no  Ear pressure: no  Eyes red/itching:yes Eye drainage/crusting: no  Vomiting: no Rash: no Fatigue: yes Sick contacts: yes Strep contacts: no  Context: better Recurrent sinusitis: no Relief with OTC cold/cough medications: no  Treatments attempted: prednisone   Relevant past medical, surgical, family and social history reviewed and updated as indicated. Interim medical history since our last visit reviewed. Allergies and medications reviewed and updated.  Review of Systems  Constitutional: Negative.   HENT: Positive for congestion, postnasal drip, rhinorrhea, sinus pressure, sneezing and sore throat. Negative for dental problem, drooling, ear discharge, ear pain, facial swelling, hearing loss, mouth sores, nosebleeds, tinnitus, trouble swallowing and voice change.   Respiratory: Positive for cough, chest tightness, shortness of breath and wheezing. Negative for apnea, choking and stridor.   Cardiovascular: Negative.   Musculoskeletal: Negative.   Psychiatric/Behavioral: Negative.     Per HPI unless specifically indicated above     Objective:    BP 174/108 mmHg  Pulse 91  Temp(Src) 98.6 F (37 C)  Wt 190 lb (86.183 kg)  SpO2 97%  Wt Readings from Last 3 Encounters:  12/25/15  190 lb (86.183 kg)  10/31/15 190 lb (86.183 kg)  10/18/15 195 lb 11.2 oz (88.769 kg)    Physical Exam  Constitutional: She is oriented to person, place, and time. She appears well-developed and well-nourished. No distress.  HENT:  Head: Normocephalic and atraumatic.  Right Ear: Hearing normal.  Left Ear: Hearing normal.  Nose: Nose normal.  Eyes: Conjunctivae and lids are normal. Right eye exhibits no discharge. Left eye exhibits no discharge. No scleral icterus.  Cardiovascular: Normal rate, regular rhythm, normal heart sounds and intact distal pulses.  Exam reveals no gallop and no friction rub.   No murmur heard. Pulmonary/Chest: Effort normal. No respiratory distress. She has decreased breath sounds in the right upper field, the right middle field, the right lower field, the left upper field, the left middle field and the left lower field. She has wheezes in the right middle field, the right lower field, the left middle field and the left lower field. She has no rales. She exhibits no tenderness.  Musculoskeletal: Normal range of motion.  Neurological: She is alert and oriented to person, place, and time.  Skin: Skin is warm, dry and intact. No rash noted. No erythema. No pallor.  Psychiatric: She has a normal mood and affect. Her speech is normal and behavior is normal. Judgment and thought content normal. Cognition and memory are normal.  Nursing note and vitals reviewed.   Results for orders placed or performed in visit on 10/31/15  Bayer DCA Hb A1c Waived  Result Value Ref Range   Bayer DCA Hb A1c Waived 11.2 (H) <7.0 %  Comprehensive metabolic  panel  Result Value Ref Range   Glucose 351 (H) 65 - 99 mg/dL   BUN 12 8 - 27 mg/dL   Creatinine, Ser 0.68 0.57 - 1.00 mg/dL   GFR calc non Af Amer 91 >59 mL/min/1.73   GFR calc Af Amer 105 >59 mL/min/1.73   BUN/Creatinine Ratio 18 11 - 26   Sodium 135 134 - 144 mmol/L   Potassium 4.0 3.5 - 5.2 mmol/L   Chloride 95 (L) 96 - 106  mmol/L   CO2 25 18 - 29 mmol/L   Calcium 8.7 8.7 - 10.3 mg/dL   Total Protein 6.1 6.0 - 8.5 g/dL   Albumin 3.9 3.6 - 4.8 g/dL   Globulin, Total 2.2 1.5 - 4.5 g/dL   Albumin/Globulin Ratio 1.8 1.1 - 2.5   Bilirubin Total 0.5 0.0 - 1.2 mg/dL   Alkaline Phosphatase 119 (H) 39 - 117 IU/L   AST 11 0 - 40 IU/L   ALT 17 0 - 32 IU/L      Assessment & Plan:   Problem List Items Addressed This Visit      Respiratory   COPD (chronic obstructive pulmonary disease) (H[facility])   Relevant Medications   predniSONE (DELTASONE) 10 MG tablet   azithromycin (ZITHROMAX) 250 MG tablet   benzonatate (TESSALON) 200 MG capsule   chlorpheniramine-HYDROcodone (TUSSIONEX PENNKINETIC ER) 10-8 MG/5ML SUER   Other Relevant Orders   PR DEMO &/OR EVAL,PT USE,AEROSOL DEVICE   COPD exacerbation ([facility]) - Primary   Relevant Medications   predniSONE (DELTASONE) 10 MG tablet   azithromycin (ZITHROMAX) 250 MG tablet   benzonatate (TESSALON) 200 MG capsule   chlorpheniramine-HYDROcodone (TUSSIONEX PENNKINETIC ER) 10-8 MG/5ML SUER     Genitourinary   Hypertensive renal disease    Increase lisinopril to 10mg . Recheck 1 week. Call with concerns.        Other Visit Diagnoses    Shortness of breath        Relevant Orders    PR DEMO &/OR EVAL,PT USE,AEROSOL DEVICE    Low oxygen saturation        Relevant Orders    PR DEMO &/OR EVAL,PT USE,AEROSOL DEVICE    Insomnia        Due to the prednisone. Will treat with short course of trazodone. Recheck 1 week.         Follow up plan: Return in about 1 week (around 01/01/2016) for lung and BP recheck.

## 2015-12-27 ENCOUNTER — Telehealth: Payer: Self-pay | Admitting: Family Medicine

## 2015-12-27 NOTE — Telephone Encounter (Signed)
Pt needs a rx for thrush called in to [facility] [address].

## 2015-12-27 NOTE — Telephone Encounter (Signed)
Routing to provider  

## 2015-12-30 MED ORDER — FLUCONAZOLE 100 MG PO TABS: 100.0000 mg | ORAL_TABLET | Freq: Every day | ORAL | Status: DC

## 2015-12-30 NOTE — Telephone Encounter (Signed)
Rx sent to her pharmacy 

## 2016-01-02 ENCOUNTER — Encounter: Payer: Self-pay | Admitting: Family Medicine

## 2016-01-02 ENCOUNTER — Ambulatory Visit (INDEPENDENT_AMBULATORY_CARE_PROVIDER_SITE_OTHER): Payer: Self-pay | Admitting: Family Medicine

## 2016-01-02 VITALS — BP 149/91 | HR 88 | Temp 99.1°F | Wt 191.0 lb

## 2016-01-02 DIAGNOSIS — J449 Chronic obstructive pulmonary disease, unspecified: Secondary | ICD-10-CM

## 2016-01-02 NOTE — Progress Notes (Signed)
BP 149/91 mmHg  Pulse 88  Temp(Src) 99.1 F (37.3 C)  Wt 191 lb (86.637 kg)  SpO2 95%   Subjective:    [name], female    DOB: [birth year], 70 y.o.   MRN: [id number]  HPI: Sarah White is a 70 y.o. female  Chief Complaint  Patient presents with  . Lung Recheck   She is feeling much better. Off the medication. Breathing much better. No concerns or complaints at this time.   Relevant past medical, surgical, family and social history reviewed and updated as indicated. Interim medical history since our last visit reviewed. Allergies and medications reviewed and updated.  Review of Systems  Constitutional: Negative.   Respiratory: Negative.   Cardiovascular: Negative.   Psychiatric/Behavioral: Negative.     Per HPI unless specifically indicated above     Objective:    BP 149/91 mmHg  Pulse 88  Temp(Src) 99.1 F (37.3 C)  Wt 191 lb (86.637 kg)  SpO2 95%  Wt Readings from Last 3 Encounters:  01/02/16 191 lb (86.637 kg)  12/25/15 190 lb (86.183 kg)  10/31/15 190 lb (86.183 kg)    Physical Exam  Constitutional: She is oriented to person, place, and time. She appears well-developed and well-nourished. No distress.  HENT:  Head: Normocephalic and atraumatic.  Right Ear: Hearing normal.  Left Ear: Hearing normal.  Nose: Nose normal.  Eyes: Conjunctivae and lids are normal. Right eye exhibits no discharge. Left eye exhibits no discharge. No scleral icterus.  Cardiovascular: Normal rate, regular rhythm, normal heart sounds and intact distal pulses.  Exam reveals no gallop and no friction rub.   No murmur heard. Pulmonary/Chest: Effort normal and breath sounds normal. No respiratory distress. She has no wheezes. She has no rales. She exhibits no tenderness.  Musculoskeletal: Normal range of motion.  Neurological: She is alert and oriented to person, place, and time.  Skin: Skin is warm, dry and intact. No rash noted. No erythema. No pallor.  Psychiatric:  She has a normal mood and affect. Her speech is normal and behavior is normal. Judgment and thought content normal. Cognition and memory are normal.  Nursing note and vitals reviewed.   Results for orders placed or performed in visit on 10/31/15  Bayer DCA Hb A1c Waived  Result Value Ref Range   Bayer DCA Hb A1c Waived 11.2 (H) <7.0 %  Comprehensive metabolic panel  Result Value Ref Range   Glucose 351 (H) 65 - 99 mg/dL   BUN 12 8 - 27 mg/dL   Creatinine, Ser 0.68 0.57 - 1.00 mg/dL   GFR calc non Af Amer 91 >59 mL/min/1.73   GFR calc Af Amer 105 >59 mL/min/1.73   BUN/Creatinine Ratio 18 11 - 26   Sodium 135 134 - 144 mmol/L   Potassium 4.0 3.5 - 5.2 mmol/L   Chloride 95 (L) 96 - 106 mmol/L   CO2 25 18 - 29 mmol/L   Calcium 8.7 8.7 - 10.3 mg/dL   Total Protein 6.1 6.0 - 8.5 g/dL   Albumin 3.9 3.6 - 4.8 g/dL   Globulin, Total 2.2 1.5 - 4.5 g/dL   Albumin/Globulin Ratio 1.8 1.1 - 2.5   Bilirubin Total 0.5 0.0 - 1.2 mg/dL   Alkaline Phosphatase 119 (H) 39 - 117 IU/L   AST 11 0 - 40 IU/L   ALT 17 0 - 32 IU/L      Assessment & Plan:   Problem List Items Addressed This Visit  Respiratory   COPD (chronic obstructive pulmonary disease) ([place]) - Primary    Exacerbation resolved. Continue inhalers. Continue to monitor. Call with any concerns.           Follow up plan: Return As scheduled.

## 2016-01-02 NOTE — Assessment & Plan Note (Signed)
Exacerbation resolved. Continue inhalers. Continue to monitor. Call with any concerns.

## 2016-02-03 ENCOUNTER — Other Ambulatory Visit: Payer: Self-pay

## 2016-02-03 DIAGNOSIS — E118 Type 2 diabetes mellitus with unspecified complications: Secondary | ICD-10-CM

## 2016-02-04 ENCOUNTER — Ambulatory Visit (INDEPENDENT_AMBULATORY_CARE_PROVIDER_SITE_OTHER): Payer: Medicare Other | Admitting: Family Medicine

## 2016-02-04 ENCOUNTER — Encounter: Payer: Self-pay | Admitting: Family Medicine

## 2016-02-04 VITALS — BP 127/71 | HR 86 | Temp 99.0°F | Wt 194.0 lb

## 2016-02-04 DIAGNOSIS — IMO0001 Reserved for inherently not codable concepts without codable children: Secondary | ICD-10-CM

## 2016-02-04 DIAGNOSIS — E118 Type 2 diabetes mellitus with unspecified complications: Secondary | ICD-10-CM | POA: Diagnosis not present

## 2016-02-04 DIAGNOSIS — E1165 Type 2 diabetes mellitus with hyperglycemia: Secondary | ICD-10-CM

## 2016-02-04 DIAGNOSIS — I129 Hypertensive chronic kidney disease with stage 1 through stage 4 chronic kidney disease, or unspecified chronic kidney disease: Secondary | ICD-10-CM | POA: Diagnosis not present

## 2016-02-04 DIAGNOSIS — E785 Hyperlipidemia, unspecified: Secondary | ICD-10-CM | POA: Diagnosis not present

## 2016-02-04 LAB — LIPID PANEL PICCOLO, WAIVED
CHOLESTEROL PICCOLO, WAIVED: 164 mg/dL (ref <200)
Chol/HDL Ratio Piccolo,Waive: 4.3 mg/dL
HDL Chol Piccolo, Waived: 38 mg/dL — ABNORMAL LOW (ref >59)
LDL CHOL CALC PICCOLO WAIVED: 75 mg/dL (ref <100)
Triglycerides Piccolo,Waived: 258 mg/dL — ABNORMAL HIGH (ref <150)
VLDL Chol Calc Piccolo,Waive: 52 mg/dL — ABNORMAL HIGH (ref <30)

## 2016-02-04 LAB — BAYER DCA HB A1C WAIVED: HB A1C (BAYER DCA - WAIVED): 8.4 % — ABNORMAL HIGH (ref <7.0)

## 2016-02-04 MED ORDER — AMLODIPINE BESYLATE 2.5 MG PO TABS
2.5000 mg | ORAL_TABLET | Freq: Every day | ORAL | Status: DC
Start: 2016-02-04 — End: 2016-03-05

## 2016-02-04 MED ORDER — CANAGLIFLOZIN 100 MG PO TABS: 100.0000 mg | ORAL_TABLET | Freq: Every day | ORAL | Status: DC

## 2016-02-04 NOTE — Assessment & Plan Note (Signed)
Under better control. A1c down to 8.4- likely elevated due to prednisone. Will keep to current regimen and recheck in 3 months.

## 2016-02-04 NOTE — Progress Notes (Signed)
BP 127/71 mmHg  Pulse 86  Temp(Src) 99 F (37.2 C)  Wt 194 lb (87.998 kg)  SpO2 94%   Subjective:    [name], female    DOB: 11/15/1945, 70 y.o.   MRN: [id number]  HPI: Sarah White is a 70 y.o. female  Chief Complaint  Patient presents with  . Diabetes  . Hypertension    Patient states that she is continuing to have dizzy spells.  Marland Kitchen COPD   DIABETES- A1c 8.4% Hypoglycemic episodes:no Polydipsia/polyuria: no Visual disturbance: yes Chest pain: no Paresthesias: no Glucose Monitoring: yes  Accucheck frequency: Daily  Fasting glucose: 200 Taking Insulin?: no Blood Pressure Monitoring: a few times a day Retinal Examination: Up to Date Foot Exam: Up to Date Diabetic Education: Completed Pneumovax: Up to Date Influenza: Up to Date Aspirin: yes  HYPERTENSION / HYPERLIPIDEMIA Satisfied with current treatment? no Duration of hypertension: chronic BP monitoring frequency: a few times a day BP range: 130s/80s BP medication side effects: no Duration of hyperlipidemia: chronic Cholesterol medication side effects: no Cholesterol supplements: none Past cholesterol medications: atorvastain (lipitor) Medication compliance: excellent compliance Aspirin: yes Recent stressors: no Recurrent headaches: no Visual changes: yes Palpitations: no Dyspnea: no Chest pain: no Lower extremity edema: no Dizzy/lightheaded: yes   Relevant past medical, surgical, family and social history reviewed and updated as indicated. Interim medical history since our last visit reviewed. Allergies and medications reviewed and updated.  Review of Systems  Constitutional: Negative.   Respiratory: Negative.   Cardiovascular: Negative.   Neurological: Positive for dizziness and light-headedness. Negative for tremors, seizures, syncope, facial asymmetry, speech difficulty, weakness, numbness and headaches.  Psychiatric/Behavioral: Negative.     Per HPI unless specifically  indicated above     Objective:    BP 127/71 mmHg  Pulse 86  Temp(Src) 99 F (37.2 C)  Wt 194 lb (87.998 kg)  SpO2 94%  Wt Readings from Last 3 Encounters:  02/04/16 194 lb (87.998 kg)  01/02/16 191 lb (86.637 kg)  12/25/15 190 lb (86.183 kg)    Orthostatic VS for the past 24 hrs:  BP- Lying Pulse- Lying BP- Sitting Pulse- Sitting BP- Standing at 0 minutes Pulse- Standing at 0 minutes  02/04/16 1053 142/83 mmHg 87 138/80 mmHg 89 141/84 mmHg 91   Physical Exam  Constitutional: She is oriented to person, place, and time. She appears well-developed and well-nourished. No distress.  HENT:  Head: Normocephalic and atraumatic.  Right Ear: Hearing normal.  Left Ear: Hearing normal.  Nose: Nose normal.  Eyes: Conjunctivae and lids are normal. Right eye exhibits no discharge. Left eye exhibits no discharge. No scleral icterus.  Cardiovascular: Normal rate, regular rhythm, normal heart sounds and intact distal pulses.  Exam reveals no gallop and no friction rub.   No murmur heard. Pulmonary/Chest: Effort normal and breath sounds normal. No respiratory distress. She has no wheezes. She has no rales. She exhibits no tenderness.  Musculoskeletal: Normal range of motion.  Neurological: She is alert and oriented to person, place, and time.  Skin: Skin is warm, dry and intact. No rash noted. No erythema. No pallor.  Psychiatric: She has a normal mood and affect. Her speech is normal and behavior is normal. Judgment and thought content normal. Cognition and memory are normal.  Nursing note and vitals reviewed.   Results for orders placed or performed in visit on 10/31/15  Bayer DCA Hb A1c Waived  Result Value Ref Range   Bayer DCA Hb A1c Waived  11.2 (H) <7.0 %  Comprehensive metabolic panel  Result Value Ref Range   Glucose 351 (H) 65 - 99 mg/dL   BUN 12 8 - 27 mg/dL   Creatinine, Ser 0.68 0.57 - 1.00 mg/dL   GFR calc non Af Amer 91 >59 mL/min/1.73   GFR calc Af Amer 105 >59 mL/min/1.73    BUN/Creatinine Ratio 18 11 - 26   Sodium 135 134 - 144 mmol/L   Potassium 4.0 3.5 - 5.2 mmol/L   Chloride 95 (L) 96 - 106 mmol/L   CO2 25 18 - 29 mmol/L   Calcium 8.7 8.7 - 10.3 mg/dL   Total Protein 6.1 6.0 - 8.5 g/dL   Albumin 3.9 3.6 - 4.8 g/dL   Globulin, Total 2.2 1.5 - 4.5 g/dL   Albumin/Globulin Ratio 1.8 1.1 - 2.5   Bilirubin Total 0.5 0.0 - 1.2 mg/dL   Alkaline Phosphatase 119 (H) 39 - 117 IU/L   AST 11 0 - 40 IU/L   ALT 17 0 - 32 IU/L      Assessment & Plan:   Problem List Items Addressed This Visit      Endocrine   Diabetes mellitus type 2, uncontrolled ([place])    Under better control. A1c down to 8.4- likely elevated due to prednisone. Will keep to current regimen and recheck in 3 months.       Relevant Medications   canagliflozin (INVOKANA) 100 MG TABS tablet     Genitourinary   Hypertensive renal disease - Primary    Under good control, but feeling really dizzy on the lisinopril. Cannot tolerate. Will stop her lisinopril and start amlodipine. Recheck in 1 month to see how she's doing.       Relevant Orders   Comprehensive metabolic panel     Other   Hyperlipidemia    Under good control. Continue current regimen. Continue to monitor.       Relevant Medications   amLODipine (NORVASC) 2.5 MG tablet   Other Relevant Orders   Comprehensive metabolic panel   Lipid Panel [place], Waived       Follow up plan: Return in about 4 weeks (around 03/03/2016) for follow up BP and dizziness.

## 2016-02-04 NOTE — Assessment & Plan Note (Signed)
Under good control, but feeling really dizzy on the lisinopril. Cannot tolerate. Will stop her lisinopril and start amlodipine. Recheck in 1 month to see how she's doing.

## 2016-02-04 NOTE — Assessment & Plan Note (Signed)
Under good control. Continue current regimen. Continue to monitor.  

## 2016-02-05 ENCOUNTER — Encounter: Payer: Self-pay | Admitting: Family Medicine

## 2016-02-05 LAB — COMPREHENSIVE METABOLIC PANEL
ALK PHOS: 117 IU/L (ref 39–117)
ALT: 14 IU/L (ref 0–32)
AST: 13 IU/L (ref 0–40)
Albumin/Globulin Ratio: 2.2 (ref 1.2–2.2)
Albumin: 4.2 g/dL (ref 3.6–4.8)
BILIRUBIN TOTAL: 0.4 mg/dL (ref 0.0–1.2)
BUN/Creatinine Ratio: 14 (ref 12–28)
BUN: 11 mg/dL (ref 8–27)
CHLORIDE: 97 mmol/L (ref 96–106)
CO2: 26 mmol/L (ref 18–29)
CREATININE: 0.77 mg/dL (ref 0.57–1.00)
Calcium: 9.8 mg/dL (ref 8.7–10.3)
GFR calc Af Amer: 92 mL/min/{1.73_m2} (ref >59)
GFR calc non Af Amer: 80 mL/min/{1.73_m2} (ref >59)
GLOBULIN, TOTAL: 1.9 g/dL (ref 1.5–4.5)
Glucose: 139 mg/dL — ABNORMAL HIGH (ref 65–99)
POTASSIUM: 4.2 mmol/L (ref 3.5–5.2)
SODIUM: 140 mmol/L (ref 134–144)
Total Protein: 6.1 g/dL (ref 6.0–8.5)

## 2016-02-06 ENCOUNTER — Telehealth: Payer: Self-pay | Admitting: Family Medicine

## 2016-02-06 NOTE — Telephone Encounter (Signed)
She said it was her blood pressure medication.

## 2016-02-06 NOTE — Telephone Encounter (Signed)
Amlodipine called into [facility] [address], left on voicemail.

## 2016-02-06 NOTE — Telephone Encounter (Signed)
Pt called stated her prescriptions were not received at her pharmacy yet, wants to know if they can be resent. Pharm is [facility] [address]. Thanks.

## 2016-02-06 NOTE — Telephone Encounter (Signed)
I sent over her medicine on the 6th. If it's her coreg, she shouldn't be out until July, if it's her lasix, I didn't know she needed a refill on it. Which blood pressure medicine? It also looks like her medicines went to [facility] [address]. If she wants [facility] to [facility] can we please call her amlodipine and her invokana over there please

## 2016-02-06 NOTE — Telephone Encounter (Signed)
Do you know what medication she is talking about?

## 2016-02-28 ENCOUNTER — Telehealth: Payer: Self-pay | Admitting: Family Medicine

## 2016-02-28 NOTE — Telephone Encounter (Signed)
Patient notified

## 2016-02-28 NOTE — Telephone Encounter (Signed)
Patient called, she states that the Amlodipine is not helping,her blood pressure is 150-160's/70-80's, she is not dizzy or sick, it is just not lowering her BP. She has been taking 5mg  for a week.

## 2016-02-28 NOTE — Telephone Encounter (Addendum)
She can go up to 3 pills daily and if it's not helping by Monday, let me know

## 2016-02-28 NOTE — Telephone Encounter (Signed)
Pt has questions regarding her blood pressure medication.  Please call pt.

## 2016-03-02 ENCOUNTER — Telehealth: Payer: Self-pay | Admitting: Family Medicine

## 2016-03-02 DIAGNOSIS — E669 Obesity, unspecified: Secondary | ICD-10-CM | POA: Diagnosis not present

## 2016-03-02 DIAGNOSIS — E119 Type 2 diabetes mellitus without complications: Secondary | ICD-10-CM | POA: Diagnosis not present

## 2016-03-02 DIAGNOSIS — R0602 Shortness of breath: Secondary | ICD-10-CM | POA: Diagnosis not present

## 2016-03-02 DIAGNOSIS — I10 Essential (primary) hypertension: Secondary | ICD-10-CM | POA: Diagnosis not present

## 2016-03-02 DIAGNOSIS — F17200 Nicotine dependence, unspecified, uncomplicated: Secondary | ICD-10-CM | POA: Diagnosis not present

## 2016-03-02 DIAGNOSIS — I429 Cardiomyopathy, unspecified: Secondary | ICD-10-CM | POA: Diagnosis not present

## 2016-03-02 DIAGNOSIS — I5022 Chronic systolic (congestive) heart failure: Secondary | ICD-10-CM | POA: Diagnosis not present

## 2016-03-02 DIAGNOSIS — E785 Hyperlipidemia, unspecified: Secondary | ICD-10-CM | POA: Diagnosis not present

## 2016-03-02 NOTE — Telephone Encounter (Signed)
Can we please get her in for an appointment? Thanks!

## 2016-03-02 NOTE — Telephone Encounter (Signed)
Called and spoke to patient. She has appointment scheduled 03/05/16.

## 2016-03-02 NOTE — Telephone Encounter (Signed)
BP is down 145/80, but is having swelling in ankles.  Pt's cardiologist said it could be from the Amlodipine.  Please call pt to discuss.

## 2016-03-05 ENCOUNTER — Ambulatory Visit (INDEPENDENT_AMBULATORY_CARE_PROVIDER_SITE_OTHER): Payer: Medicare Other | Admitting: Family Medicine

## 2016-03-05 ENCOUNTER — Encounter: Payer: Self-pay | Admitting: Family Medicine

## 2016-03-05 VITALS — BP 142/76 | HR 80 | Temp 98.1°F | Ht 64.3 in | Wt 195.0 lb

## 2016-03-05 DIAGNOSIS — I129 Hypertensive chronic kidney disease with stage 1 through stage 4 chronic kidney disease, or unspecified chronic kidney disease: Secondary | ICD-10-CM | POA: Diagnosis not present

## 2016-03-05 MED ORDER — CANAGLIFLOZIN 100 MG PO TABS: 100.0000 mg | ORAL_TABLET | Freq: Every day | ORAL | Status: DC

## 2016-03-05 MED ORDER — DULAGLUTIDE 0.75 MG/0.5ML ~~LOC~~ SOAJ: 0.7500 mg | SUBCUTANEOUS | Status: DC

## 2016-03-05 MED ORDER — AMLODIPINE BESYLATE 5 MG PO TABS: 7.5000 mg | ORAL_TABLET | Freq: Every day | ORAL | Status: DC

## 2016-03-05 MED ORDER — HYDROCHLOROTHIAZIDE 25 MG PO TABS: 25.0000 mg | ORAL_TABLET | Freq: Every day | ORAL | Status: DC

## 2016-03-05 NOTE — Progress Notes (Signed)
BP 142/76 mmHg  Pulse 80  Temp(Src) 98.1 F (36.7 C)  Ht 5' 4.3" (1.633 m)  Wt 195 lb (88.451 kg)  BMI 33.17 kg/m2  SpO2 97%   Subjective:    [name], female    DOB: 02-07-1946, 70 y.o.   MRN: [id number]  HPI: Sarah White is a 70 y.o. female  Chief Complaint  Patient presents with  . Medication Refill    Invokana  . Hypertension  . Foot Swelling    left   HYPERTENSION Hypertension status: uncontrolled  Satisfied with current treatment? no Duration of hypertension: chronic BP monitoring frequency:  a few times a week BP range: 150s/80s BP medication side effects:  yes- foot swelling Medication compliance: excellent compliance Previous BP meds: lisinopril- "didn't feel right", amlodipine- swelling Aspirin: no Recurrent headaches: no Visual changes: no Palpitations: no Dyspnea: no Chest pain: no Lower extremity edema: yes Dizzy/lightheaded: yes  Relevant past medical, surgical, family and social history reviewed and updated as indicated. Interim medical history since our last visit reviewed. Allergies and medications reviewed and updated.  Review of Systems  Constitutional: Negative.   Respiratory: Negative.   Cardiovascular: Negative.   Psychiatric/Behavioral: Negative.     Per HPI unless specifically indicated above     Objective:    BP 142/76 mmHg  Pulse 80  Temp(Src) 98.1 F (36.7 C)  Ht 5' 4.3" (1.633 m)  Wt 195 lb (88.451 kg)  BMI 33.17 kg/m2  SpO2 97%  Wt Readings from Last 3 Encounters:  03/05/16 195 lb (88.451 kg)  02/04/16 194 lb (87.998 kg)  01/02/16 191 lb (86.637 kg)    Physical Exam  Constitutional: She is oriented to person, place, and time. She appears well-developed and well-nourished. No distress.  HENT:  Head: Normocephalic and atraumatic.  Right Ear: Hearing normal.  Left Ear: Hearing normal.  Nose: Nose normal.  Eyes: Conjunctivae and lids are normal. Right eye exhibits no discharge. Left eye exhibits  no discharge. No scleral icterus.  Cardiovascular: Normal rate, regular rhythm, normal heart sounds and intact distal pulses.  Exam reveals no gallop and no friction rub.   No murmur heard. Pulmonary/Chest: Effort normal and breath sounds normal. No respiratory distress. She has no wheezes. She has no rales. She exhibits no tenderness.  Musculoskeletal: Normal range of motion. She exhibits edema (trace L foot).  Neurological: She is alert and oriented to person, place, and time.  Skin: Skin is warm, dry and intact. No rash noted. No erythema. No pallor.  Psychiatric: She has a normal mood and affect. Her speech is normal and behavior is normal. Judgment and thought content normal. Cognition and memory are normal.  Nursing note and vitals reviewed.   Results for orders placed or performed in visit on 02/04/16  Bayer DCA Hb A1c Waived  Result Value Ref Range   Bayer DCA Hb A1c Waived 8.4 (H) <7.0 %  Comprehensive metabolic panel  Result Value Ref Range   Glucose 139 (H) 65 - 99 mg/dL   BUN 11 8 - 27 mg/dL   Creatinine, Ser 0.77 0.57 - 1.00 mg/dL   GFR calc non Af Amer 80 >59 mL/min/1.73   GFR calc Af Amer 92 >59 mL/min/1.73   BUN/Creatinine Ratio 14 12 - 28   Sodium 140 134 - 144 mmol/L   Potassium 4.2 3.5 - 5.2 mmol/L   Chloride 97 96 - 106 mmol/L   CO2 26 18 - 29 mmol/L   Calcium 9.8 8.7 -  10.3 mg/dL   Total Protein 6.1 6.0 - 8.5 g/dL   Albumin 4.2 3.6 - 4.8 g/dL   Globulin, Total 1.9 1.5 - 4.5 g/dL   Albumin/Globulin Ratio 2.2 1.2 - 2.2   Bilirubin Total 0.4 0.0 - 1.2 mg/dL   Alkaline Phosphatase 117 39 - 117 IU/L   AST 13 0 - 40 IU/L   ALT 14 0 - 32 IU/L  Lipid Panel Piccolo, Waived  Result Value Ref Range   Cholesterol Piccolo, Waived 164 <200 mg/dL   HDL Chol Piccolo, Waived 38 (L) >59 mg/dL   Triglycerides Piccolo,Waived 258 (H) <150 mg/dL   Chol/HDL Ratio Piccolo,Waive 4.3 mg/dL   LDL Chol Calc Piccolo Waived 75 <100 mg/dL   VLDL Chol Calc Piccolo,Waive 52 (H) <30  mg/dL      Assessment & Plan:   Problem List Items Addressed This Visit      Genitourinary   Hypertensive renal disease - Primary    Still not under good control. Will start HCTZ, having swelling on amlodipine, so will not increase it to 10mg . Could not tolerate ACE. Recheck 1 month with BMP.          Follow up plan: Return in about 4 weeks (around 04/02/2016) for Recheck BP.

## 2016-03-05 NOTE — Patient Instructions (Signed)
Hydrochlorothiazide, HCTZ capsules or tablets  What is this medicine?  HYDROCHLOROTHIAZIDE (hye droe klor oh THYE a zide) is a diuretic. It increases the amount of urine passed, which causes the body to lose salt and water. This medicine is used to treat high blood pressure. It is also reduces the swelling and water retention caused by various medical conditions, such as heart, liver, or kidney disease.  This medicine may be used for other purposes; ask your health care provider or pharmacist if you have questions.  What should I tell my health care provider before I take this medicine?  They need to know if you have any of these conditions:  -diabetes  -gout  -immune system problems, like lupus  -kidney disease or kidney stones  -liver disease  -pancreatitis  -small amount of urine or difficulty passing urine  -an unusual or allergic reaction to hydrochlorothiazide, sulfa drugs, other medicines, foods, dyes, or preservatives  -pregnant or trying to get pregnant  -breast-feeding  How should I use this medicine?  Take this medicine by mouth with a glass of water. Follow the directions on the prescription label. Take your medicine at regular intervals. Remember that you will need to pass urine frequently after taking this medicine. Do not take your doses at a time of day that will cause you problems. Do not stop taking your medicine unless your doctor tells you to.  Talk to your pediatrician regarding the use of this medicine in children. Special care may be needed.  Overdosage: If you think you have taken too much of this medicine contact a poison control center or emergency room at once.  NOTE: This medicine is only for you. Do not share this medicine with others.  What if I miss a dose?  If you miss a dose, take it as soon as you can. If it is almost time for your next dose, take only that dose. Do not take double or extra doses.  What may interact with this  medicine?  -cholestyramine  -colestipol  -digoxin  -dofetilide  -lithium  -medicines for blood pressure  -medicines for diabetes  -medicines that relax muscles for surgery  -other diuretics  -steroid medicines like prednisone or cortisone  This list may not describe all possible interactions. Give your health care provider a list of all the medicines, herbs, non-prescription drugs, or dietary supplements you use. Also tell them if you smoke, drink alcohol, or use illegal drugs. Some items may interact with your medicine.  What should I watch for while using this medicine?  Visit your doctor or health care professional for regular checks on your progress. Check your blood pressure as directed. Ask your doctor or health care professional what your blood pressure should be and when you should contact him or her.  You may need to be on a special diet while taking this medicine. Ask your doctor.  Check with your doctor or health care professional if you get an attack of severe diarrhea, nausea and vomiting, or if you sweat a lot. The loss of too much body fluid can make it dangerous for you to take this medicine.  You may get drowsy or dizzy. Do not drive, use machinery, or do anything that needs mental alertness until you know how this medicine affects you. Do not stand or sit up quickly, especially if you are an older patient. This reduces the risk of dizzy or fainting spells. Alcohol may interfere with the effect of this medicine. Avoid alcoholic drinks.    This medicine may affect your blood sugar level. If you have diabetes, check with your doctor or health care professional before changing the dose of your diabetic medicine.  This medicine can make you more sensitive to the sun. Keep out of the sun. If you cannot avoid being in the sun, wear protective clothing and use sunscreen. Do not use sun lamps or tanning beds/booths.  What side effects may I notice from receiving this medicine?  Side effects that you should  report to your doctor or health care professional as soon as possible:  -allergic reactions such as skin rash or itching, hives, swelling of the lips, mouth, tongue, or throat  -changes in vision  -chest pain  -eye pain  -fast or irregular heartbeat  -feeling faint or lightheaded, falls  -gout attack  -muscle pain or cramps  -pain or difficulty when passing urine  -pain, tingling, numbness in the hands or feet  -redness, blistering, peeling or loosening of the skin, including inside the mouth  -unusually weak or tired  Side effects that usually do not require medical attention (report to your doctor or health care professional if they continue or are bothersome):  -change in sex drive or performance  -dry mouth  -headache  -stomach upset  This list may not describe all possible side effects. Call your doctor for medical advice about side effects. You may report side effects to FDA at 1-800-FDA-1088.  Where should I keep my medicine?  Keep out of the reach of children.  Store at room temperature between 15 and 30 degrees C (59 and 86 degrees F). Do not freeze. Protect from light and moisture. Keep container closed tightly. Throw away any unused medicine after the expiration date.  NOTE: This sheet is a summary. It may not cover all possible information. If you have questions about this medicine, talk to your doctor, pharmacist, or health care provider.     © 2016, [employer]. (2010-04-11 12:57:37)

## 2016-03-05 NOTE — Assessment & Plan Note (Signed)
Still not under good control. Will start HCTZ, having swelling on amlodipine, so will not increase it to 10mg . Could not tolerate ACE. Recheck 1 month with BMP.

## 2016-03-11 ENCOUNTER — Emergency Department: Payer: Medicare Other

## 2016-03-11 ENCOUNTER — Telehealth: Payer: Self-pay | Admitting: Family Medicine

## 2016-03-11 ENCOUNTER — Observation Stay
Admission: EM | Admit: 2016-03-11 | Discharge: 2016-03-12 | Disposition: A | Discharge status: Home / Self Care | Payer: Medicare Other | Attending: Internal Medicine | Admitting: Internal Medicine

## 2016-03-11 DIAGNOSIS — J449 Chronic obstructive pulmonary disease, unspecified: Secondary | ICD-10-CM | POA: Insufficient documentation

## 2016-03-11 DIAGNOSIS — R55 Syncope and collapse: Principal | ICD-10-CM | POA: Diagnosis present

## 2016-03-11 DIAGNOSIS — Z8701 Personal history of pneumonia (recurrent): Secondary | ICD-10-CM | POA: Insufficient documentation

## 2016-03-11 DIAGNOSIS — I2510 Atherosclerotic heart disease of native coronary artery without angina pectoris: Secondary | ICD-10-CM | POA: Insufficient documentation

## 2016-03-11 DIAGNOSIS — F17209 Nicotine dependence, unspecified, with unspecified nicotine-induced disorders: Secondary | ICD-10-CM | POA: Diagnosis not present

## 2016-03-11 DIAGNOSIS — E876 Hypokalemia: Secondary | ICD-10-CM | POA: Diagnosis present

## 2016-03-11 DIAGNOSIS — R112 Nausea with vomiting, unspecified: Secondary | ICD-10-CM | POA: Diagnosis not present

## 2016-03-11 DIAGNOSIS — Z955 Presence of coronary angioplasty implant and graft: Secondary | ICD-10-CM | POA: Insufficient documentation

## 2016-03-11 DIAGNOSIS — Z716 Tobacco abuse counseling: Secondary | ICD-10-CM | POA: Diagnosis not present

## 2016-03-11 DIAGNOSIS — E782 Mixed hyperlipidemia: Secondary | ICD-10-CM | POA: Diagnosis not present

## 2016-03-11 DIAGNOSIS — I447 Left bundle-branch block, unspecified: Secondary | ICD-10-CM | POA: Diagnosis not present

## 2016-03-11 DIAGNOSIS — Z66 Do not resuscitate: Secondary | ICD-10-CM | POA: Diagnosis not present

## 2016-03-11 DIAGNOSIS — Z79899 Other long term (current) drug therapy: Secondary | ICD-10-CM | POA: Diagnosis not present

## 2016-03-11 DIAGNOSIS — Z9889 Other specified postprocedural states: Secondary | ICD-10-CM | POA: Diagnosis not present

## 2016-03-11 DIAGNOSIS — R9431 Abnormal electrocardiogram [ECG] [EKG]: Secondary | ICD-10-CM | POA: Diagnosis not present

## 2016-03-11 DIAGNOSIS — Z90710 Acquired absence of both cervix and uterus: Secondary | ICD-10-CM | POA: Insufficient documentation

## 2016-03-11 DIAGNOSIS — Z7951 Long term (current) use of inhaled steroids: Secondary | ICD-10-CM | POA: Diagnosis not present

## 2016-03-11 DIAGNOSIS — F17200 Nicotine dependence, unspecified, uncomplicated: Secondary | ICD-10-CM | POA: Diagnosis not present

## 2016-03-11 DIAGNOSIS — I493 Ventricular premature depolarization: Secondary | ICD-10-CM

## 2016-03-11 DIAGNOSIS — R42 Dizziness and giddiness: Secondary | ICD-10-CM | POA: Diagnosis not present

## 2016-03-11 DIAGNOSIS — I110 Hypertensive heart disease with heart failure: Secondary | ICD-10-CM | POA: Diagnosis not present

## 2016-03-11 DIAGNOSIS — F17210 Nicotine dependence, cigarettes, uncomplicated: Secondary | ICD-10-CM | POA: Insufficient documentation

## 2016-03-11 DIAGNOSIS — Z8542 Personal history of malignant neoplasm of other parts of uterus: Secondary | ICD-10-CM | POA: Diagnosis not present

## 2016-03-11 DIAGNOSIS — Z7982 Long term (current) use of aspirin: Secondary | ICD-10-CM | POA: Insufficient documentation

## 2016-03-11 HISTORY — DX: Heart failure, unspecified: I50.9

## 2016-03-11 LAB — URINALYSIS COMPLETE WITH MICROSCOPIC (ARMC ONLY)
BACTERIA UA: NONE SEEN
Bilirubin Urine: NEGATIVE
Glucose, UA: >500 mg/dL — AB
HGB URINE DIPSTICK: NEGATIVE
Ketones, ur: NEGATIVE mg/dL
LEUKOCYTES UA: NEGATIVE
Nitrite: NEGATIVE
PH: 6 (ref 5.0–8.0)
PROTEIN: NEGATIVE mg/dL
RBC / HPF: NONE SEEN RBC/hpf (ref 0–5)
SQUAMOUS EPITHELIAL / LPF: NONE SEEN
Specific Gravity, Urine: 1.003 — ABNORMAL LOW (ref 1.005–1.030)

## 2016-03-11 LAB — CBC
HEMATOCRIT: 49.5 % — AB (ref 35.0–47.0)
HEMOGLOBIN: 17.1 g/dL — AB (ref 12.0–16.0)
MCH: 27.6 pg (ref 26.0–34.0)
MCHC: 34.4 g/dL (ref 32.0–36.0)
MCV: 80.2 fL (ref 80.0–100.0)
Platelets: 204 10*3/uL (ref 150–440)
RBC: 6.18 MIL/uL — AB (ref 3.80–5.20)
RDW: 14.7 % — ABNORMAL HIGH (ref 11.5–14.5)
WBC: 11.4 10*3/uL — AB (ref 3.6–11.0)

## 2016-03-11 LAB — TROPONIN I: Troponin I: <0.03 ng/mL (ref <0.03)

## 2016-03-11 LAB — BASIC METABOLIC PANEL
ANION GAP: 11 (ref 5–15)
BUN: 15 mg/dL (ref 6–20)
CHLORIDE: 90 mmol/L — AB (ref 101–111)
CO2: 34 mmol/L — AB (ref 22–32)
CREATININE: 0.71 mg/dL (ref 0.44–1.00)
Calcium: 9.5 mg/dL (ref 8.9–10.3)
GFR calc non Af Amer: >60 mL/min (ref >60)
Glucose, Bld: 180 mg/dL — ABNORMAL HIGH (ref 65–99)
POTASSIUM: 3 mmol/L — AB (ref 3.5–5.1)
Sodium: 135 mmol/L (ref 135–145)

## 2016-03-11 LAB — GLUCOSE, CAPILLARY: Glucose-Capillary: 165 mg/dL — ABNORMAL HIGH (ref 65–99)

## 2016-03-11 LAB — MAGNESIUM: Magnesium: 2.1 mg/dL (ref 1.7–2.4)

## 2016-03-11 MED ORDER — ENOXAPARIN SODIUM 40 MG/0.4ML ~~LOC~~ SOLN
40.0000 mg | SUBCUTANEOUS | Status: DC
  Filled 2016-03-11: qty 0.4

## 2016-03-11 MED ORDER — SODIUM CHLORIDE 0.9 % IV SOLN
Freq: Once | INTRAVENOUS | Status: AC
  Administered 2016-03-11: 14:00:00 via INTRAVENOUS

## 2016-03-11 MED ORDER — POTASSIUM CHLORIDE CRYS ER 20 MEQ PO TBCR
40.0000 meq | EXTENDED_RELEASE_TABLET | Freq: Once | ORAL | Status: AC
  Administered 2016-03-11: 40 meq via ORAL
  Filled 2016-03-11: qty 2

## 2016-03-11 MED ORDER — ONDANSETRON HCL 4 MG PO TABS: 4.0000 mg | ORAL_TABLET | Freq: Four times a day (QID) | ORAL | Status: DC | PRN

## 2016-03-11 MED ORDER — FUROSEMIDE 40 MG PO TABS
40.0000 mg | ORAL_TABLET | Freq: Every day | ORAL | Status: DC
  Administered 2016-03-12: 40 mg via ORAL
  Filled 2016-03-11: qty 1

## 2016-03-11 MED ORDER — NICOTINE 14 MG/24HR TD PT24
14.0000 mg | MEDICATED_PATCH | Freq: Every day | TRANSDERMAL | Status: DC
  Filled 2016-03-11: qty 1

## 2016-03-11 MED ORDER — INSULIN ASPART 100 UNIT/ML ~~LOC~~ SOLN: 0.0000 [IU] | Freq: Three times a day (TID) | SUBCUTANEOUS | Status: DC

## 2016-03-11 MED ORDER — ACETAMINOPHEN 325 MG PO TABS: 650.0000 mg | ORAL_TABLET | Freq: Four times a day (QID) | ORAL | Status: DC | PRN

## 2016-03-11 MED ORDER — ONDANSETRON HCL 4 MG/2ML IJ SOLN: 4.0000 mg | Freq: Four times a day (QID) | INTRAMUSCULAR | Status: DC | PRN

## 2016-03-11 MED ORDER — TIOTROPIUM BROMIDE MONOHYDRATE 18 MCG IN CAPS
18.0000 ug | ORAL_CAPSULE | Freq: Every day | RESPIRATORY_TRACT | Status: DC
  Administered 2016-03-12: 18 ug via RESPIRATORY_TRACT
  Filled 2016-03-11: qty 5

## 2016-03-11 MED ORDER — ATORVASTATIN CALCIUM 20 MG PO TABS
40.0000 mg | ORAL_TABLET | Freq: Every day | ORAL | Status: DC
  Administered 2016-03-11 – 2016-03-12 (×2): 40 mg via ORAL
  Filled 2016-03-11 (×2): qty 2

## 2016-03-11 MED ORDER — POTASSIUM CHLORIDE IN NACL 20-0.9 MEQ/L-% IV SOLN
INTRAVENOUS | Status: DC
  Administered 2016-03-11: 20:00:00 via INTRAVENOUS
  Filled 2016-03-11 (×2): qty 1000

## 2016-03-11 MED ORDER — ALBUTEROL SULFATE HFA 108 (90 BASE) MCG/ACT IN AERS
1.0000 | INHALATION_SPRAY | RESPIRATORY_TRACT | Status: DC | PRN
Start: 2016-03-11 — End: 2016-03-11

## 2016-03-11 MED ORDER — CARVEDILOL 12.5 MG PO TABS
12.5000 mg | ORAL_TABLET | Freq: Two times a day (BID) | ORAL | Status: DC
  Administered 2016-03-12: 12.5 mg via ORAL
  Filled 2016-03-11: qty 1

## 2016-03-11 MED ORDER — ACETAMINOPHEN 650 MG RE SUPP: 650.0000 mg | Freq: Four times a day (QID) | RECTAL | Status: DC | PRN

## 2016-03-11 MED ORDER — MOMETASONE FURO-FORMOTEROL FUM 200-5 MCG/ACT IN AERO
2.0000 | INHALATION_SPRAY | Freq: Two times a day (BID) | RESPIRATORY_TRACT | Status: DC
  Administered 2016-03-12: 2 via RESPIRATORY_TRACT
  Filled 2016-03-11: qty 8.8

## 2016-03-11 MED ORDER — TRAZODONE HCL 50 MG PO TABS
25.0000 mg | ORAL_TABLET | Freq: Every evening | ORAL | Status: DC | PRN
  Administered 2016-03-11: 50 mg via ORAL
  Filled 2016-03-11: qty 2

## 2016-03-11 MED ORDER — INSULIN ASPART 100 UNIT/ML ~~LOC~~ SOLN: 0.0000 [IU] | Freq: Every day | SUBCUTANEOUS | Status: DC

## 2016-03-11 MED ORDER — DOXYLAMINE SUCCINATE (SLEEP) 25 MG PO TABS
25.0000 mg | ORAL_TABLET | Freq: Every evening | ORAL | Status: DC | PRN
  Filled 2016-03-11: qty 1

## 2016-03-11 MED ORDER — ALBUTEROL SULFATE (2.5 MG/3ML) 0.083% IN NEBU: 2.5000 mg | INHALATION_SOLUTION | RESPIRATORY_TRACT | Status: DC | PRN

## 2016-03-11 MED ORDER — DOCUSATE SODIUM 100 MG PO CAPS
100.0000 mg | ORAL_CAPSULE | Freq: Every day | ORAL | Status: DC
  Administered 2016-03-11 – 2016-03-12 (×2): 100 mg via ORAL
  Filled 2016-03-11 (×2): qty 1

## 2016-03-11 MED ORDER — ASPIRIN EC 81 MG PO TBEC
81.0000 mg | DELAYED_RELEASE_TABLET | Freq: Every day | ORAL | Status: DC
  Administered 2016-03-11 – 2016-03-12 (×2): 81 mg via ORAL
  Filled 2016-03-11 (×2): qty 1

## 2016-03-11 NOTE — ED Notes (Signed)
X-ray at bedside

## 2016-03-11 NOTE — ED Notes (Signed)
Patients son, Jalilah Hawken, would like his number to be left.  [phone].

## 2016-03-11 NOTE — Telephone Encounter (Signed)
Pt called stated she is so dizzy she can't function, she does not know what's going on. Scare she is going to fall. Please call pt ASAP @ [phone]. Thanks.

## 2016-03-11 NOTE — Telephone Encounter (Signed)
Spoke with patient, she is having severe dizzy spells. Patients BP and blood sugar are both fine. Advised patient to go the ER, due to this being the second day.

## 2016-03-11 NOTE — H&P (Signed)
[facility] [place] NAME: Sarah White    MR#:  [id number]  DATE OF BIRTH:  1946/07/28  DATE OF ADMISSION:  03/11/2016  PRIMARY CARE PHYSICIAN: Park Liter, DO   REQUESTING/REFERRING PHYSICIAN: [name], MD  CHIEF COMPLAINT:   Chief Complaint  Patient presents with  . Dizziness  . Emesis   Dizziness and near syncope for 2 days. HISTORY OF PRESENT ILLNESS:  Sarah White  is a 70 y.o. female with a known history of Hypertension, diabetes and COPD. The patient presented in ED with the dizziness and near syncope. She was recently given amlodipine and HCTZ for hypertension. She has been feeling dizziness, weakness, nausea and leg swelling for the past few days. Her EKG showed numerous PVCs, intermittent bigeminy.   PAST MEDICAL HISTORY:   Past Medical History  Diagnosis Date  . COPD (chronic obstructive pulmonary disease) ([place])   . Diabetes mellitus without complication ([place])   . Cancer ([place])     uterine  . Pneumonia     November 2016  . CHF (congestive heart failure) ([place])     PAST SURGICAL HISTORY:   Past Surgical History  Procedure Laterality Date  . Knee surgery Left   . Carpal tunnel release Bilateral   . Coronary angioplasty with stent placement    . Abdominal hysterectomy    . Cesarean section      SOCIAL HISTORY:   Social History  Substance Use Topics  . Smoking status: Current Every Day Smoker -- 0.50 packs/day    Types: Cigarettes    Last Attempt to Quit: 07/17/2015  . Smokeless tobacco: Never Used  . Alcohol Use: No    FAMILY HISTORY:   Family History  Problem Relation Age of Onset  . Heart disease Mother     DRUG ALLERGIES:   Allergies  Allergen Reactions  . Codeine Nausea And Vomiting  . Lisinopril Other (See Comments)    Dizziness   . Metformin And Related Nausea Only    Nausea, dizziness    REVIEW OF SYSTEMS:  CONSTITUTIONAL: No fever,But has near syncope and  generalized weakness.  EYES: No blurred or double vision.  EARS, NOSE, AND THROAT: No tinnitus or ear pain.  RESPIRATORY: No cough, shortness of breath, wheezing or hemoptysis.  CARDIOVASCULAR: No chest pain, orthopnea, has leg edema.  GASTROINTESTINAL: Has nausea, vomiting, no diarrhea or abdominal pain.  GENITOURINARY: No dysuria, hematuria.  ENDOCRINE: No polyuria, nocturia,  HEMATOLOGY: No anemia, easy bruising or bleeding SKIN: No rash or lesion. MUSCULOSKELETAL: No joint pain or arthritis.   NEUROLOGIC: No tingling, numbness, weakness.  PSYCHIATRY: No anxiety or depression.   MEDICATIONS AT HOME:   Prior to Admission medications   Medication Sig Start Date End Date Taking? Authorizing Provider  acetaminophen (TYLENOL) 500 MG tablet Take 500 mg by mouth every 6 (six) hours as needed.    Yes Historical Provider, MD  albuterol (PROVENTIL) (2.5 MG/3ML) 0.083% nebulizer solution Take 3 mLs (2.5 mg total) by nebulization every 6 (six) hours as needed for wheezing or shortness of breath. 10/16/15  Yes [name] Johnson, DO  amLODipine (NORVASC) 5 MG tablet Take 1.5 tablets (7.5 mg total) by mouth daily. 03/05/16  Yes [name] Johnson, DO  aspirin EC 81 MG tablet Take 81 mg by mouth daily.   Yes Historical Provider, MD  atorvastatin (LIPITOR) 40 MG tablet Take 1 tablet (40 mg total) by mouth daily. 09/24/15  Yes Callaway, DO  budesonide-formoterol (SYMBICORT) 160-4.5 MCG/ACT inhaler Inhale 2 puffs into the lungs 2 (two) times daily. 09/24/15  Yes [name] Johnson, DO  canagliflozin (INVOKANA) 100 MG TABS tablet Take 1 tablet (100 mg total) by mouth daily before breakfast. 03/05/16  Yes [name] Johnson, DO  carvedilol (COREG) 12.5 MG tablet Take 1 tablet (12.5 mg total) by mouth 2 (two) times daily with a meal. 09/24/15  Yes [name] Johnson, DO  doxylamine, Sleep, (UNISOM) 25 MG tablet Take 25 mg by mouth at bedtime as needed. Pt takes a 1/2 pill for sleeping at bedtime   Yes Historical Provider,  MD  Dulaglutide (TRULICITY) A999333 [id number] SOPN Inject 0.75 mg into the skin once a week. 03/05/16  Yes [name] Johnson, DO  furosemide (LASIX) 40 MG tablet Take 1 tablet (40 mg total) by mouth daily. 09/24/15  Yes [name] Johnson, DO  hydrochlorothiazide (HYDRODIURIL) 25 MG tablet Take 1 tablet (25 mg total) by mouth daily. 03/05/16  Yes [name] Johnson, DO  Multiple Vitamins-Minerals (MULTIVITAMIN & MINERAL PO) Take 1 tablet by mouth daily.    Yes Historical Provider, MD  tiotropium (SPIRIVA HANDIHALER) 18 MCG inhalation capsule Place 1 capsule (18 mcg total) into inhaler and inhale daily. 09/24/15  Yes [name] Johnson, DO  traZODone (DESYREL) 50 MG tablet Take 0.5-1 tablets (25-50 mg total) by mouth at bedtime as needed for sleep. 12/25/15  Yes [name] Johnson, DO  VENTOLIN HFA 108 (90 Base) MCG/ACT inhaler Inhale 1-2 puffs into the lungs every 4 (four) hours as needed for wheezing or shortness of breath. 09/24/15  Yes [name] Johnson, DO  ACCU-CHEK SOFTCLIX LANCETS lancets  01/17/14   Historical Provider, MD  docusate sodium (COLACE) 100 MG capsule Take 100 mg by mouth daily.     Historical Provider, MD  glucose blood test strip  01/17/14   Historical Provider, MD      VITAL SIGNS:  Blood pressure 156/79, pulse 77, temperature 98.1 F (36.7 C), temperature source Oral, resp. rate 15, height 5\' 4"  (1.626 m), weight 197 lb (89.359 kg), SpO2 97 %.  PHYSICAL EXAMINATION:  GENERAL:  70 y.o.-year-old patient lying in the bed with no acute distress. Obese. EYES: Pupils equal, round, reactive to light and accommodation. No scleral icterus. Extraocular muscles intact.  HEENT: Head atraumatic, normocephalic. Oropharynx and nasopharynx clear.  NECK:  Supple, no jugular venous distention. No thyroid enlargement, no tenderness.  LUNGS: Very diminished breath sounds bilaterally, no wheezing, rales,rhonchi or crepitation. No use of accessory muscles of respiration.  CARDIOVASCULAR: S1, S2 normal. No murmurs, rubs,  or gallops.  ABDOMEN: Soft, nontender, nondistended. Bowel sounds present. No organomegaly or mass.  EXTREMITIES: Trace leg edema, no cyanosis, or clubbing.  NEUROLOGIC: Cranial nerves II through XII are intact. Muscle strength 5/5 in all extremities. Sensation intact. Gait not checked.  PSYCHIATRIC: The patient is alert and oriented x 3.  SKIN: No obvious rash, lesion, or ulcer.   LABORATORY PANEL:   CBC  Recent Labs Lab 03/11/16 1252  WBC 11.4*  HGB 17.1*  HCT 49.5*  PLT 204   ------------------------------------------------------------------------------------------------------------------  Chemistries   Recent Labs Lab 03/11/16 1252  NA 135  K 3.0*  CL 90*  CO2 34*  GLUCOSE 180*  BUN 15  CREATININE 0.71  CALCIUM 9.5   ------------------------------------------------------------------------------------------------------------------  Cardiac Enzymes  Recent Labs Lab 03/11/16 1252  TROPONINI <0.03   ------------------------------------------------------------------------------------------------------------------  RADIOLOGY:  Dg Chest 1 View  03/11/2016  CLINICAL DATA:  Two day history of  dizziness. EXAM: CHEST 1 VIEW COMPARISON:  10/16/2015 FINDINGS: The heart size and mediastinal contours are within normal limits. Both lungs are clear. The visualized skeletal structures are unremarkable. IMPRESSION: Normal chest x-ray. Electronically Signed   By: Marijo Sanes M.D.   On: 03/11/2016 13:53    EKG:   Orders placed or performed during the hospital encounter of 03/11/16  . EKG 12-Lead  . EKG 12-Lead  . EKG 12-Lead  . EKG 12-Lead  . ED EKG  . ED EKG    IMPRESSION AND PLAN:   Near syncope with abnormal EKG. His and will be placed for observation. Continue telemetry monitor, IV fluid support and cardiology consult.  Hypokalemia. Give potassium supplement, follow-up BMP and magnesium level.  Normal EKG venous numerous PVCs, intermittent  bigeminy. Telemetry monitor and Follow-up cardiology consult  Hypertension. Hold HCTZ and Norvasc due to hypokalemia and leg swelling, continue Coreg.  Diabetes. Hold by mouth medications and start a sliding scale.  Chronic diastolic CHF EF: [id number] - [id number], stable.  COPD. Stable, NEB when necessary.  Tobacco abuse. Smoking cessation was counseled for 3 minutes, nicotine patch.  All the records are reviewed and case discussed with ED provider. Management plans discussed with the patient, her son and they are in agreement.  CODE STATUS: DO NOT RESUSCITATE (the patient wants DO NOT RESUSCITATE)  TOTAL TIME TAKING CARE OF THIS PATIENT: 56 minutes.    Demetrios Loll M.D on 03/11/2016 at 4:08 PM  Between 7am to 6pm - Pager - [phone]  After 6pm go to [email or web address] - password [email or web address] [facility]  Office  [phone]  CC: Primary care physician; Park Liter, DO

## 2016-03-11 NOTE — ED Notes (Signed)
Attempting to call report at this time. 

## 2016-03-11 NOTE — ED Notes (Signed)
Pt c/o dizziness with N/V, off balance for the past 4 days with intermittent heart palpitations.

## 2016-03-11 NOTE — ED Provider Notes (Signed)
[facility] Emergency Department Provider Note        Time seen: ----------------------------------------- 1:09 PM on 03/11/2016 -----------------------------------------    I have reviewed the triage vital signs and the nursing notes.   HISTORY  Chief Complaint Dizziness and Emesis    HPI Sarah White is a 70 y.o. female who presents to ER for dizziness with nausea and vomiting. Patient reports she's been off balance for the last 4 days and has intermittent heart palpitations.Her main complaint is that she is going to pass out. Patient states she feels near syncopal frequently throughout the day, nothing makes it better. She has it when she is lying when she is standing or with various activities. Recently she is placed on HCTZ for blood pressure as well as amlodipine.   Past Medical History  Diagnosis Date  . COPD (chronic obstructive pulmonary disease) ([place])   . Diabetes mellitus without complication ([place])   . Cancer ([place])     uterine  . Pneumonia     November 2016    Patient Active Problem List   Diagnosis Date Noted  . Orthostatic hypotension 08/12/2015  . Hyperlipidemia 07/29/2015  . Acute systolic CHF (congestive heart failure) ([place]) 07/25/2015  . Hypertensive renal disease 07/25/2015  . COPD exacerbation ([place]) 07/25/2015  . Low back pain 07/25/2015  . Acute respiratory failure with hypoxia ([place]) 07/22/2015  . Acute respiratory failure with hypoxia and hypercarbia ([place]) 07/22/2015  . Pulmonary edema 07/22/2015  . Smoker 07/22/2015  . Encephalopathy acute 07/22/2015  . COPD (chronic obstructive pulmonary disease) ([place]) 07/22/2015  . Diabetes mellitus type 2, uncontrolled ([place]) 07/22/2015  . CAD (coronary artery disease) 07/22/2015  . Community acquired pneumonia 07/17/2015    Past Surgical History  Procedure Laterality Date  . Knee surgery Left   . Carpal tunnel release Bilateral   . Coronary angioplasty with stent placement     . Abdominal hysterectomy    . Cesarean section      Allergies Codeine; Lisinopril; and Metformin and related  Social History Social History  Substance Use Topics  . Smoking status: Former Smoker -- 0.50 packs/day    Types: Cigarettes    Quit date: 07/17/2015  . Smokeless tobacco: Never Used  . Alcohol Use: No    Review of Systems Constitutional: Negative for fever. Eyes: Negative for visual changes. ENT: Negative for sore throat. Cardiovascular: Negative for chest pain. Positive for palpitations Respiratory: Negative for shortness of breath. Gastrointestinal: Negative for abdominal pain, vomiting and diarrhea. Genitourinary: Negative for dysuria. Musculoskeletal: Negative for back pain. Skin: Negative for rash. Neurological: Negative for headaches, positive for weakness  10-point ROS otherwise negative.  ____________________________________________   PHYSICAL EXAM:  VITAL SIGNS: ED Triage Vitals  Enc Vitals Group     BP 03/11/16 1239 138/58 mmHg     Pulse Rate 03/11/16 1239 46     Resp 03/11/16 1239 17     Temp 03/11/16 1239 98.1 F (36.7 C)     Temp Source 03/11/16 1239 Oral     SpO2 03/11/16 1239 94 %     Weight 03/11/16 1239 197 lb (89.359 kg)     Height 03/11/16 1239 5\' 4"  (1.626 m)     Head Cir --      Peak Flow --      Pain Score --      Pain Loc --      Pain Edu? --      Excl. in [place]? --  Constitutional: Alert and oriented. Well appearing and in no distress. Eyes: Conjunctivae are normal. PERRL. Normal extraocular movements. ENT   Head: Normocephalic and atraumatic.   Nose: No congestion/rhinnorhea.   Mouth/Throat: Mucous membranes are moist.   Neck: No stridor. Cardiovascular: Normal rate, regular rhythm. No murmurs, rubs, or gallops. Respiratory: Normal respiratory effort without tachypnea nor retractions. Breath sounds are clear and equal bilaterally. No wheezes/rales/rhonchi. Gastrointestinal: Soft and nontender. Normal bowel  sounds Musculoskeletal: Nontender with normal range of motion in all extremities. No lower extremity tenderness nor edema. Neurologic:  Normal speech and language. No gross focal neurologic deficits are appreciated.  Skin:  Skin is warm, dry and intact. No rash noted. Psychiatric: Mood and affect are normal. Speech and behavior are normal.  ____________________________________________  EKG: Interpreted by me.Sinus rhythm with a rate of 94 bpm, normal PR interval, long QT, inferior ST and T-wave changes. Multiple PVCs suggestive of bigeminy  ____________________________________________  ED COURSE:  Pertinent labs & imaging results that were available during my care of the patient were reviewed by me and considered in my medical decision making (see chart for details). Patient is in no acute distress but is having frequent ectopy. I will check basic labs and monitor her. ____________________________________________    LABS (pertinent positives/negatives)  Labs Reviewed  BASIC METABOLIC PANEL - Abnormal; Notable for the following:    Potassium 3.0 (*)    Chloride 90 (*)    CO2 34 (*)    Glucose, Bld 180 (*)    All other components within normal limits  CBC - Abnormal; Notable for the following:    WBC 11.4 (*)    RBC 6.18 (*)    Hemoglobin 17.1 (*)    HCT 49.5 (*)    RDW 14.7 (*)    All other components within normal limits  URINALYSIS COMPLETEWITH MICROSCOPIC ([facility] ONLY) - Abnormal; Notable for the following:    Color, Urine COLORLESS (*)    APPearance CLEAR (*)    Glucose, UA >500 (*)    Specific Gravity, Urine 1.003 (*)    All other components within normal limits  CBG MONITORING, ED    RADIOLOGY Images were viewed by me  Chest x-ray IMPRESSION: Normal chest x-ray.  ____________________________________________  FINAL ASSESSMENT AND PLAN  Near-syncope, PVCs  Plan: Patient with labs and imaging as dictated above. Patient presents to ER with numerous PVCs,  intermittent bigeminy. She is having near syncopal events. I will recommend observation on telemetry, cardiac consultation   [name], MD   Note: This dictation was prepared with Dragon dictation. Any transcriptional errors that result from this process are unintentional   [name], MD 03/11/16 1520

## 2016-03-11 NOTE — ED Notes (Signed)
Patients son brought her a chicken salad sandwich and iced tea. Patients nausea is gone and she feels up to eating.

## 2016-03-12 DIAGNOSIS — E876 Hypokalemia: Secondary | ICD-10-CM | POA: Diagnosis not present

## 2016-03-12 DIAGNOSIS — I10 Essential (primary) hypertension: Secondary | ICD-10-CM | POA: Diagnosis not present

## 2016-03-12 DIAGNOSIS — I2510 Atherosclerotic heart disease of native coronary artery without angina pectoris: Secondary | ICD-10-CM | POA: Diagnosis not present

## 2016-03-12 DIAGNOSIS — R112 Nausea with vomiting, unspecified: Secondary | ICD-10-CM | POA: Diagnosis not present

## 2016-03-12 DIAGNOSIS — R55 Syncope and collapse: Secondary | ICD-10-CM | POA: Diagnosis not present

## 2016-03-12 DIAGNOSIS — I493 Ventricular premature depolarization: Secondary | ICD-10-CM | POA: Diagnosis not present

## 2016-03-12 LAB — BASIC METABOLIC PANEL
ANION GAP: 7 (ref 5–15)
BUN: 13 mg/dL (ref 6–20)
CHLORIDE: 97 mmol/L — AB (ref 101–111)
CO2: 34 mmol/L — AB (ref 22–32)
Calcium: 8.9 mg/dL (ref 8.9–10.3)
Creatinine, Ser: 0.54 mg/dL (ref 0.44–1.00)
GFR calc Af Amer: >60 mL/min (ref >60)
GFR calc non Af Amer: >60 mL/min (ref >60)
GLUCOSE: 105 mg/dL — AB (ref 65–99)
POTASSIUM: 3.2 mmol/L — AB (ref 3.5–5.1)
Sodium: 138 mmol/L (ref 135–145)

## 2016-03-12 LAB — CBC
HEMATOCRIT: 47.9 % — AB (ref 35.0–47.0)
HEMOGLOBIN: 16 g/dL (ref 12.0–16.0)
MCH: 27.5 pg (ref 26.0–34.0)
MCHC: 33.4 g/dL (ref 32.0–36.0)
MCV: 82.5 fL (ref 80.0–100.0)
Platelets: 165 10*3/uL (ref 150–440)
RBC: 5.81 MIL/uL — ABNORMAL HIGH (ref 3.80–5.20)
RDW: 14.8 % — AB (ref 11.5–14.5)
WBC: 11.6 10*3/uL — ABNORMAL HIGH (ref 3.6–11.0)

## 2016-03-12 LAB — HEMOGLOBIN A1C: Hgb A1c MFr Bld: 7.4 % — ABNORMAL HIGH (ref 4.0–6.0)

## 2016-03-12 LAB — GLUCOSE, CAPILLARY: GLUCOSE-CAPILLARY: 117 mg/dL — AB (ref 65–99)

## 2016-03-12 MED ORDER — NICOTINE 14 MG/24HR TD PT24: 14.0000 mg | MEDICATED_PATCH | Freq: Every day | TRANSDERMAL | Status: DC

## 2016-03-12 MED ORDER — POTASSIUM CHLORIDE CRYS ER 20 MEQ PO TBCR
40.0000 meq | EXTENDED_RELEASE_TABLET | Freq: Every day | ORAL | Status: DC
  Administered 2016-03-12: 40 meq via ORAL
  Filled 2016-03-12: qty 2

## 2016-03-12 MED ORDER — ZOLPIDEM TARTRATE 5 MG PO TABS: 10.0000 mg | ORAL_TABLET | Freq: Every evening | ORAL | Status: DC | PRN

## 2016-03-12 MED ORDER — ZOLPIDEM TARTRATE 5 MG PO TABS
5.0000 mg | ORAL_TABLET | Freq: Every evening | ORAL | Status: DC | PRN
  Administered 2016-03-12: 5 mg via ORAL
  Filled 2016-03-12: qty 1

## 2016-03-12 MED ORDER — POTASSIUM CHLORIDE ER 10 MEQ PO TBCR: 10.0000 meq | EXTENDED_RELEASE_TABLET | Freq: Every day | ORAL | Status: DC

## 2016-03-12 NOTE — Progress Notes (Signed)
Discharge instructions along with home medication list and follow up gone over with patient. Patient verbalized that she understood instructions. No printed rx given to patient, patient made aware rx where electronically submitted to pharmacy. DNR form given to patient. Patient to be discharged home on RA, no distress noted, son at bedside to transport patient. Iv and telemetry removed.

## 2016-03-12 NOTE — Discharge Summary (Signed)
[facility] [place] NAME: Sarah White    MR#:  [id number]  DATE OF BIRTH:  [birth year]  DATE OF ADMISSION:  03/11/2016 ADMITTING PHYSICIAN: Demetrios Loll, MD  DATE OF DISCHARGE: 03/12/2016 10:03 AM  PRIMARY CARE PHYSICIAN: Park Liter, DO    ADMISSION DIAGNOSIS:  PVC (premature ventricular contraction) [I49.3] Near syncope [R55]  DISCHARGE DIAGNOSIS:  Principal Problem:   Near syncope Active Problems:   Hypokalemia PVC  SECONDARY DIAGNOSIS:   Past Medical History  Diagnosis Date  . COPD (chronic obstructive pulmonary disease) ([place])   . Diabetes mellitus without complication ([place])   . Cancer ([place])     uterine  . Pneumonia     November 2016  . CHF (congestive heart failure) [facility])     HOSPITAL COURSE:   70 year old female with ASCVD who was recently started on HCTZ presented with nausea vomiting and dizziness. EKG showed ventricular bigeminy. For further details please refer the H&P.  1. Ventricular bigeminy/PVCs: This is likely due to dehydration and hypokalemia. Symptoms have resolved and telemetry monitoring has improved. She was evaluated by cardiology while in the hospital who recommended discontinuing HCTZ. 2. Nausea and vomiting: This is related to HCTZ as per patient. She is actually doing quite well since the discontinuation of HCTZ. We are stopping this medication.  3. ASCVD: Continue Norvasc, atorvastatin, aspirin, Coreg and Lasix. Patient will follow up with cardiologist outpatient.  4. Diabetes: Patient will resume her outpatient medications and ADA diet.  5. History of COPD: Patient not have signs of exacerbation. Patient will continue inhalers.   DISCHARGE CONDITIONS AND DIET:   Stable for discharge on heart healthy diet/diabetic diet  CONSULTS OBTAINED:  Treatment Team:  Yolonda Kida, MD Corey Skains, MD  DRUG ALLERGIES:   Allergies  Allergen Reactions  . Codeine Nausea And Vomiting  .  Hctz [Hydrochlorothiazide] Other (See Comments)    Hypokalemia  . Lisinopril Other (See Comments)    Dizziness   . Metformin And Related Nausea Only    Nausea, dizziness    DISCHARGE MEDICATIONS:   Discharge Medication List as of 03/12/2016  9:27 AM    START taking these medications   Details  nicotine (NICODERM CQ - DOSED IN MG/24 HOURS) 14 mg/24hr patch Place 1 patch (14 mg total) onto the skin daily., Starting 03/12/2016, Until Discontinued, Normal    potassium chloride (K-DUR) 10 MEQ tablet Take 1 tablet (10 mEq total) by mouth daily., Starting 03/12/2016, Until Discontinued, Normal      CONTINUE these medications which have NOT CHANGED   Details  acetaminophen (TYLENOL) 500 MG tablet Take 500 mg by mouth every 6 (six) hours as needed. , Until Discontinued, Historical Med    albuterol (PROVENTIL) (2.5 MG/3ML) 0.083% nebulizer solution Take 3 mLs (2.5 mg total) by nebulization every 6 (six) hours as needed for wheezing or shortness of breath., Starting 10/16/2015, Until Discontinued, Normal    amLODipine (NORVASC) 5 MG tablet Take 1.5 tablets (7.5 mg total) by mouth daily., Starting 03/05/2016, Until Discontinued, Normal    aspirin EC 81 MG tablet Take 81 mg by mouth daily., Until Discontinued, Historical Med    atorvastatin (LIPITOR) 40 MG tablet Take 1 tablet (40 mg total) by mouth daily., Starting 09/24/2015, Until Discontinued, Normal    budesonide-formoterol (SYMBICORT) 160-4.5 MCG/ACT inhaler Inhale 2 puffs into the lungs 2 (two) times daily., Starting 09/24/2015, Until Discontinued, Normal    canagliflozin (INVOKANA) 100 MG TABS tablet Take 1 tablet (  100 mg total) by mouth daily before breakfast., Starting 03/05/2016, Until Discontinued, Normal    carvedilol (COREG) 12.5 MG tablet Take 1 tablet (12.5 mg total) by mouth 2 (two) times daily with a meal., Starting 09/24/2015, Until Discontinued, Normal    doxylamine, Sleep, (UNISOM) 25 MG tablet Take 25 mg by mouth at bedtime as  needed. Pt takes a 1/2 pill for sleeping at bedtime, Until Discontinued, Historical Med    Dulaglutide (TRULICITY) [id number] [id number] SOPN Inject 0.75 mg into the skin once a week., Starting 03/05/2016, Until Discontinued, No Print    furosemide (LASIX) 40 MG tablet Take 1 tablet (40 mg total) by mouth daily., Starting 09/24/2015, Until Discontinued, Normal    Multiple Vitamins-Minerals (MULTIVITAMIN & MINERAL PO) Take 1 tablet by mouth daily. , Until Discontinued, Historical Med    tiotropium (SPIRIVA HANDIHALER) 18 MCG inhalation capsule Place 1 capsule (18 mcg total) into inhaler and inhale daily., Starting 09/24/2015, Until Discontinued, Normal    traZODone (DESYREL) 50 MG tablet Take 0.5-1 tablets (25-50 mg total) by mouth at bedtime as needed for sleep., Starting 12/25/2015, Until Discontinued, Normal    VENTOLIN HFA 108 (90 Base) MCG/ACT inhaler Inhale 1-2 puffs into the lungs every 4 (four) hours as needed for wheezing or shortness of breath., Starting 09/24/2015, Until Discontinued, Normal    ACCU-CHEK SOFTCLIX LANCETS lancets Historical Med    docusate sodium (COLACE) 100 MG capsule Take 100 mg by mouth daily. , Until Discontinued, Historical Med    glucose blood test strip Historical Med      STOP taking these medications     hydrochlorothiazide (HYDRODIURIL) 25 MG tablet               Today   CHIEF COMPLAINT:   Vision doing very well this morning. Reports no nausea, vomiting and she thinks the hydrochlorothiazide Sarah have led to her symptoms.   VITAL SIGNS:  Blood pressure 140/60, pulse 80, temperature 98.1 F (36.7 C), temperature source Oral, resp. rate 18, height 5\' 4"  (1.626 m), weight 86.183 kg (190 lb), SpO2 94 %.   REVIEW OF SYSTEMS:  Review of Systems  Constitutional: Negative for fever, chills and malaise/fatigue.  HENT: Negative for ear discharge, ear pain, hearing loss, nosebleeds and sore throat.   Eyes: Negative for blurred vision and pain.   Respiratory: Negative for cough, hemoptysis, shortness of breath and wheezing.   Cardiovascular: Negative for chest pain, palpitations and leg swelling.  Gastrointestinal: Negative for nausea, vomiting, abdominal pain, diarrhea and blood in stool.  Genitourinary: Negative for dysuria.  Musculoskeletal: Negative for back pain.  Neurological: Negative for dizziness, tremors, speech change, focal weakness, seizures and headaches.  Endo/Heme/Allergies: Does not bruise/bleed easily.  Psychiatric/Behavioral: Negative for depression, suicidal ideas and hallucinations.     PHYSICAL EXAMINATION:  GENERAL:  70 y.o.-year-old patient lying in the bed with no acute distress.  NECK:  Supple, no jugular venous distention. No thyroid enlargement, no tenderness.  LUNGS: Normal breath sounds bilaterally, no wheezing, rales,rhonchi  No use of accessory muscles of respiration.  CARDIOVASCULAR: S1, S2 normal. No murmurs, rubs, or gallops.  ABDOMEN: Soft, non-tender, non-distended. Bowel sounds present. No organomegaly or mass.  EXTREMITIES: No pedal edema, cyanosis, or clubbing.  PSYCHIATRIC: The patient is alert and oriented x 3.  SKIN: No obvious rash, lesion, or ulcer.   DATA REVIEW:   CBC  Recent Labs Lab 03/12/16 0505  WBC 11.6*  HGB 16.0  HCT 47.9*  PLT 165    Chemistries  Recent Labs Lab 03/11/16 1941 03/12/16 0505  NA  --  138  K  --  3.2*  CL  --  97*  CO2  --  34*  GLUCOSE  --  105*  BUN  --  13  CREATININE  --  0.54  CALCIUM  --  8.9  MG 2.1  --     Cardiac Enzymes  Recent Labs Lab 03/11/16 1252  TROPONINI <0.03    Microbiology Results  @[email or web address] @  RADIOLOGY:  Dg Chest 1 View  03/11/2016  CLINICAL DATA:  Two day history of dizziness. EXAM: CHEST 1 VIEW COMPARISON:  10/16/2015 FINDINGS: The heart size and mediastinal contours are within normal limits. Both lungs are clear. The visualized skeletal structures are unremarkable. IMPRESSION: Normal chest x-ray.  Electronically Signed   By: Marijo Sanes M.D.   On: 03/11/2016 13:53      Management plans discussed with the patient and She is in agreement. Stable for discharge home  Patient should follow up with cardiology in 1 week  CODE STATUS:     Code Status Orders        Start     Ordered   03/11/16 1856  Do not attempt resuscitation (DNR)   Continuous    Question Answer Comment  In the event of cardiac or respiratory ARREST Do not call a "code blue"   In the event of cardiac or respiratory ARREST Do not perform Intubation, CPR, defibrillation or ACLS   In the event of cardiac or respiratory ARREST Use medication by any route, position, wound care, and other measures to relive pain and suffering. Sarah use oxygen, suction and manual treatment of airway obstruction as needed for comfort.      03/11/16 1855    Code Status History    Date Active Date Inactive Code Status Order ID Comments User Context   07/22/2015 12:42 PM 07/25/2015  9:49 PM Partial Code [id number]  Wilhelmina Mcardle, MD Inpatient   07/17/2015  9:19 PM 07/22/2015 12:42 PM DNR [id number]  Nicholes Mango, MD Inpatient    Advance Directive Documentation        Most Recent Value   Type of Advance Directive  Living will   Pre-existing out of facility DNR order (yellow form or pink MOST form)     "MOST" Form in Place?        TOTAL TIME TAKING CARE OF THIS PATIENT: 35 minutes.    Note: This dictation was prepared with Dragon dictation along with smaller phrase technology. Any transcriptional errors that result from this process are unintentional.  Haleema Vanderheyden M.D on 03/12/2016 at 12:06 PM  Between 7am to 6pm - Pager - [phone] After 6pm go to [email or web address] - Proofreader  [facility]  Office  [phone]  CC: Primary care physician; Park Liter, DO

## 2016-03-12 NOTE — Consult Note (Signed)
[facility] Consultation Note  Patient ID: ARDA YOH, MRN: [id number], DOB/AGE: March 09, 1946 70 y.o. Admit date: 03/11/2016   Date of Consult: 03/12/2016 Primary Physician: Park Liter, DO Primary Cardiologist: [facility]  Chief Complaint:  Chief Complaint  Patient presents with  . Dizziness  . Emesis   Reason for Consult: near syncope with known coronary artery disease  HPI: 70 y.o. female with known previous coronary artery disease status post previous stenting and left bundle branch block having mixed hyperlipidemia on appropriate medication management and diabetes well controlled but hypertension has been difficult and resistant recently. The patient was tried on multiple medications including amlodipine. The patient did have reasonable blood pressure control but started on hydrochlorothiazide which caused her nausea and discomfort to her shortness of breath weakness fatigue and dizziness and near syncope. This continued for several days until seen in the emergency room and found that there was no evidence of heart failure or myocardial infarction. EKG also showed the ventricular bigeminy. After hydration and discontinuation of hydrochlorothiazide the patient feels much better and is doing much better. The patient also previously has been on the furosemide. There is a no current evidence of chest discomfort today  Past Medical History  Diagnosis Date  . COPD (chronic obstructive pulmonary disease) ([place])   . Diabetes mellitus without complication ([place])   . Cancer ([place])     uterine  . Pneumonia     November 2016  . CHF (congestive heart failure) [facility])       Surgical History:  Past Surgical History  Procedure Laterality Date  . Knee surgery Left   . Carpal tunnel release Bilateral   . Coronary angioplasty with stent placement    . Abdominal hysterectomy    . Cesarean section       Home Meds: Prior to Admission medications   Medication Sig Start Date End Date  Taking? Authorizing Provider  acetaminophen (TYLENOL) 500 MG tablet Take 500 mg by mouth every 6 (six) hours as needed.    Yes Historical Provider, MD  albuterol (PROVENTIL) (2.5 MG/3ML) 0.083% nebulizer solution Take 3 mLs (2.5 mg total) by nebulization every 6 (six) hours as needed for wheezing or shortness of breath. 10/16/15  Yes [name] Johnson, DO  amLODipine (NORVASC) 5 MG tablet Take 1.5 tablets (7.5 mg total) by mouth daily. 03/05/16  Yes [name] Johnson, DO  aspirin EC 81 MG tablet Take 81 mg by mouth daily.   Yes Historical Provider, MD  atorvastatin (LIPITOR) 40 MG tablet Take 1 tablet (40 mg total) by mouth daily. 09/24/15  Yes [name] Johnson, DO  budesonide-formoterol (SYMBICORT) 160-4.5 MCG/ACT inhaler Inhale 2 puffs into the lungs 2 (two) times daily. 09/24/15  Yes [name] Johnson, DO  canagliflozin (INVOKANA) 100 MG TABS tablet Take 1 tablet (100 mg total) by mouth daily before breakfast. 03/05/16  Yes [name] Johnson, DO  carvedilol (COREG) 12.5 MG tablet Take 1 tablet (12.5 mg total) by mouth 2 (two) times daily with a meal. 09/24/15  Yes [name] Johnson, DO  doxylamine, Sleep, (UNISOM) 25 MG tablet Take 25 mg by mouth at bedtime as needed. Pt takes a 1/2 pill for sleeping at bedtime   Yes Historical Provider, MD  Dulaglutide (TRULICITY) [id number] SOPN Inject 0.75 mg into the skin once a week. 03/05/16  Yes [name] Johnson, DO  furosemide (LASIX) 40 MG tablet Take 1 tablet (40 mg total) by mouth daily. 09/24/15  Yes [place], DO  hydrochlorothiazide (HYDRODIURIL) 25 MG tablet Take 1 tablet (25 mg total) by mouth daily. 03/05/16  Yes [name] Johnson, DO  Multiple Vitamins-Minerals (MULTIVITAMIN & MINERAL PO) Take 1 tablet by mouth daily.    Yes Historical Provider, MD  tiotropium (SPIRIVA HANDIHALER) 18 MCG inhalation capsule Place 1 capsule (18 mcg total) into inhaler and inhale daily. 09/24/15  Yes [name] Johnson, DO  traZODone (DESYREL) 50 MG tablet Take 0.5-1 tablets (25-50 mg  total) by mouth at bedtime as needed for sleep. 12/25/15  Yes [name] Johnson, DO  VENTOLIN HFA 108 (90 Base) MCG/ACT inhaler Inhale 1-2 puffs into the lungs every 4 (four) hours as needed for wheezing or shortness of breath. 09/24/15  Yes [name] Johnson, DO  ACCU-CHEK SOFTCLIX LANCETS lancets  01/17/14   Historical Provider, MD  docusate sodium (COLACE) 100 MG capsule Take 100 mg by mouth daily.     Historical Provider, MD  glucose blood test strip  01/17/14   Historical Provider, MD    Inpatient Medications:  . aspirin EC  81 mg Oral Daily  . atorvastatin  40 mg Oral Daily  . carvedilol  12.5 mg Oral BID WC  . docusate sodium  100 mg Oral Daily  . enoxaparin (LOVENOX) injection  40 mg Subcutaneous Q24H  . furosemide  40 mg Oral Daily  . insulin aspart  0-5 Units Subcutaneous QHS  . insulin aspart  0-9 Units Subcutaneous TID WC  . mometasone-formoterol  2 puff Inhalation BID  . nicotine  14 mg Transdermal Daily  . potassium chloride  40 mEq Oral Daily  . tiotropium  18 mcg Inhalation Daily      Allergies:  Allergies  Allergen Reactions  . Codeine Nausea And Vomiting  . Lisinopril Other (See Comments)    Dizziness   . Metformin And Related Nausea Only    Nausea, dizziness    Social History   Social History  . Marital Status: Divorced    Spouse Name: N/A  . Number of Children: N/A  . Years of Education: N/A   Occupational History  . Not on file.   Social History Main Topics  . Smoking status: Current Every Day Smoker -- 0.50 packs/day    Types: Cigarettes    Last Attempt to Quit: 07/17/2015  . Smokeless tobacco: Never Used  . Alcohol Use: No  . Drug Use: No  . Sexual Activity: No   Other Topics Concern  . Not on file   Social History Narrative     Family History  Problem Relation Age of Onset  . Heart disease Mother      Review of Systems Positive for Nausea shortness of breath weakness Negative for: General:  chills, fever, night sweats or weight  changes.  Cardiovascular: PND orthopnea syncope dizziness  Dermatological skin lesions rashes Respiratory: Cough congestion Urologic: Frequent urination urination at night and hematuria Abdominal:   positive for nausea, negative for vomiting, diarrhea, bright red blood per rectum, melena, or hematemesis Neurologic: negative for visual changes, and/or hearing changes  All other systems reviewed and are otherwise negative except as noted above.  Labs:  Recent Labs  03/11/16 1252  TROPONINI <0.03   Lab Results  Component Value Date   WBC 11.6* 03/12/2016   HGB 16.0 03/12/2016   HCT 47.9* 03/12/2016   MCV 82.5 03/12/2016   PLT 165 03/12/2016    Recent Labs Lab 03/12/16 0505  NA 138  K 3.2*  CL 97*  CO2 34*  BUN  13  CREATININE 0.54  CALCIUM 8.9  GLUCOSE 105*   Lab Results  Component Value Date   CHOL 164 02/04/2016   HDL 40 08/12/2015   LDLCALC 90 08/12/2015   TRIG 285* 08/12/2015   No results found for: DDIMER  Radiology/Studies:  Dg Chest 1 View  03/11/2016  CLINICAL DATA:  Two day history of dizziness. EXAM: CHEST 1 VIEW COMPARISON:  10/16/2015 FINDINGS: The heart size and mediastinal contours are within normal limits. Both lungs are clear. The visualized skeletal structures are unremarkable. IMPRESSION: Normal chest x-ray. Electronically Signed   By: Marijo Sanes M.D.   On: 03/11/2016 13:53    EKG: Normal sinus rhythm with left bundle-branch block and telemetry showing a ventricular bigeminy  Weights: Filed Weights   03/11/16 1239 03/11/16 1755  Weight: 197 lb (89.359 kg) 190 lb (86.183 kg)     Physical Exam: Blood pressure 140/60, pulse 80, temperature 98.1 F (36.7 C), temperature source Oral, resp. rate 18, height 5\' 4"  (1.626 m), weight 190 lb (86.183 kg), SpO2 94 %. Body mass index is 32.6 kg/(m^2). General: Well developed, well nourished, in no acute distress. Head eyes ears nose throat: Normocephalic, atraumatic, sclera non-icteric, no xanthomas,  nares are without discharge. No apparent thyromegaly and/or mass  Lungs: Normal respiratory effort.  no wheezes, no rales, no rhonchi.  Heart: RRR with normal S1 S2. no murmur gallop, no rub, PMI is normal size and placement, carotid upstroke normal without bruit, jugular venous pressure is normal Abdomen: Soft, non-tender, non-distended with normoactive bowel sounds. No hepatomegaly. No rebound/guarding. No obvious abdominal masses. Abdominal aorta is normal size without bruit Extremities: No edema. no cyanosis, no clubbing, no ulcers  Peripheral : 2+ bilateral upper extremity pulses, 2+ bilateral femoral pulses, 2+ bilateral dorsal pedal pulse Neuro: Alert and oriented. No facial asymmetry. No focal deficit. Moves all extremities spontaneously. Musculoskeletal: Normal muscle tone without kyphosis Psych:  Responds to questions appropriately with a normal affect.    Assessment: 70 year old female with coronary artery disease essential hypertension makes hyperlipidemia having acute onset of near syncope nausea weakness shortness of breath without evidence of myocardial infarction and/or congestive heart failure likely secondary to addition of hydrochlorothiazide now improved  Plan: 1. Abstain from hydrochlorothiazide due to significant side effects of the medication 2. No further cardiac intervention at this time 3. No further cardiac diagnostics necessary 4. Continue amlodipine for hypertension control and further consideration of other medications if necessary for goal systolic blood pressure below 130 mm 5. Ambulation and follow for improvements of symptoms 6. High intensity cholesterol therapy with atorvastatin 7. Okay for discharge to home with follow-up in one to 2 weeks  Signed, Corey Skains M.D. [facility] 03/12/2016, 7:56 AM

## 2016-03-17 ENCOUNTER — Ambulatory Visit (INDEPENDENT_AMBULATORY_CARE_PROVIDER_SITE_OTHER): Payer: Medicare Other | Admitting: Family Medicine

## 2016-03-17 ENCOUNTER — Encounter: Payer: Self-pay | Admitting: Family Medicine

## 2016-03-17 VITALS — BP 104/71 | HR 80 | Temp 98.6°F | Wt 193.0 lb

## 2016-03-17 DIAGNOSIS — I10 Essential (primary) hypertension: Secondary | ICD-10-CM

## 2016-03-17 DIAGNOSIS — E876 Hypokalemia: Secondary | ICD-10-CM | POA: Diagnosis not present

## 2016-03-17 MED ORDER — MECLIZINE HCL 12.5 MG PO TABS: 12.5000 mg | ORAL_TABLET | Freq: Three times a day (TID) | ORAL | Status: DC | PRN

## 2016-03-17 NOTE — Patient Instructions (Signed)
Follow-up if symptoms worsen

## 2016-03-17 NOTE — Progress Notes (Signed)
BP 104/71 mmHg  Pulse 80  Temp(Src) 98.6 F (37 C)  Wt 193 lb (87.544 kg)  SpO2 96%   Subjective:    [name], female    DOB: 1946/04/16, 70 y.o.   MRN: [id number]  HPI: Sarah White is a 70 y.o. female  Chief Complaint  Patient presents with  . Hospitalization Follow-up    Started HCTZ on 03/05/16 and her potassium dropped, started having PVC's. She still complains of "just feeling bad" and still some dizziness. She sees Cardiology later this week.   Patient presents for f/u today regarding dehydration and hypokalemia presumed to be due to recent addition of HCTZ. Still feeling weak and dizzy, but feeling much better than in the hospital. Dizziness is worst when looking upward.  HCTZ was discontinued, potassium added. F/u with cardiologist on Thursday.   Has been monitoring daily weights - stable since d/c from hospital.   Can't take nicotine patches given in hospital because of nightmares. Wanting to quit but needs help.   Relevant past medical, surgical, family and social history reviewed and updated as indicated. Interim medical history since our last visit reviewed. Allergies and medications reviewed and updated.  Review of Systems  Constitutional: Negative for fever, chills and diaphoresis.  Respiratory: Negative.   Cardiovascular: Negative.   Gastrointestinal: Negative.   Musculoskeletal: Negative.   Skin: Negative.   Neurological: Positive for dizziness and weakness (generalized).  Psychiatric/Behavioral: Negative.     Per HPI unless specifically indicated above     Objective:    BP 104/71 mmHg  Pulse 80  Temp(Src) 98.6 F (37 C)  Wt 193 lb (87.544 kg)  SpO2 96%  Wt Readings from Last 3 Encounters:  03/17/16 193 lb (87.544 kg)  03/11/16 190 lb (86.183 kg)  03/05/16 195 lb (88.451 kg)    Physical Exam  Constitutional: She is oriented to person, place, and time. She appears well-developed and well-nourished. No distress.  HENT:    Head: Atraumatic.  Eyes: Conjunctivae are normal. No scleral icterus.  Neck: Normal range of motion. Neck supple.  Cardiovascular: Normal rate and normal heart sounds.   Pulmonary/Chest: Effort normal. No respiratory distress.  Abdominal: Soft. Bowel sounds are normal.  Musculoskeletal: Normal range of motion.  Neurological: She is alert and oriented to person, place, and time.  Skin: Skin is warm and dry.  Psychiatric: Her behavior is normal.  Nursing note and vitals reviewed.   Results for orders placed or performed during the hospital encounter of [id number]  Basic metabolic panel  Result Value Ref Range   Sodium 135 135 - 145 mmol/L   Potassium 3.0 (L) 3.5 - 5.1 mmol/L   Chloride 90 (L) 101 - 111 mmol/L   CO2 34 (H) 22 - 32 mmol/L   Glucose, Bld 180 (H) 65 - 99 mg/dL   BUN 15 6 - 20 mg/dL   Creatinine, Ser 0.71 0.44 - 1.00 mg/dL   Calcium 9.5 8.9 - 10.3 mg/dL   GFR calc non Af Amer >60 >60 mL/min   GFR calc Af Amer >60 >60 mL/min   Anion gap 11 5 - 15  CBC  Result Value Ref Range   WBC 11.4 (H) 3.6 - 11.0 K/uL   RBC 6.18 (H) 3.80 - 5.20 MIL/uL   Hemoglobin 17.1 (H) 12.0 - 16.0 g/dL   HCT 49.5 (H) 35.0 - 47.0 %   MCV 80.2 80.0 - 100.0 fL   MCH 27.6 26.0 - 34.0 pg  MCHC 34.4 32.0 - 36.0 g/dL   RDW 14.7 (H) 11.5 - 14.5 %   Platelets 204 150 - 440 K/uL  Urinalysis complete, with microscopic  Result Value Ref Range   Color, Urine COLORLESS (A) YELLOW   APPearance CLEAR (A) CLEAR   Glucose, UA >500 (A) NEGATIVE mg/dL   Bilirubin Urine NEGATIVE NEGATIVE   Ketones, ur NEGATIVE NEGATIVE mg/dL   Specific Gravity, Urine 1.003 (L) 1.005 - 1.030   Hgb urine dipstick NEGATIVE NEGATIVE   pH 6.0 5.0 - 8.0   Protein, ur NEGATIVE NEGATIVE mg/dL   Nitrite NEGATIVE NEGATIVE   Leukocytes, UA NEGATIVE NEGATIVE   RBC / HPF NONE SEEN 0 - 5 RBC/hpf   WBC, UA 0-5 0 - 5 WBC/hpf   Bacteria, UA NONE SEEN NONE SEEN   Squamous Epithelial / LPF NONE SEEN NONE SEEN   Mucous PRESENT    Troponin I  Result Value Ref Range   Troponin I <0.03 <0.03 ng/mL  Basic metabolic panel  Result Value Ref Range   Sodium 138 135 - 145 mmol/L   Potassium 3.2 (L) 3.5 - 5.1 mmol/L   Chloride 97 (L) 101 - 111 mmol/L   CO2 34 (H) 22 - 32 mmol/L   Glucose, Bld 105 (H) 65 - 99 mg/dL   BUN 13 6 - 20 mg/dL   Creatinine, Ser 0.54 0.44 - 1.00 mg/dL   Calcium 8.9 8.9 - 10.3 mg/dL   GFR calc non Af Amer >60 >60 mL/min   GFR calc Af Amer >60 >60 mL/min   Anion gap 7 5 - 15  CBC  Result Value Ref Range   WBC 11.6 (H) 3.6 - 11.0 K/uL   RBC 5.81 (H) 3.80 - 5.20 MIL/uL   Hemoglobin 16.0 12.0 - 16.0 g/dL   HCT 47.9 (H) 35.0 - 47.0 %   MCV 82.5 80.0 - 100.0 fL   MCH 27.5 26.0 - 34.0 pg   MCHC 33.4 32.0 - 36.0 g/dL   RDW 14.8 (H) 11.5 - 14.5 %   Platelets 165 150 - 440 K/uL  Magnesium  Result Value Ref Range   Magnesium 2.1 1.7 - 2.4 mg/dL  Hemoglobin A1c  Result Value Ref Range   Hgb A1c MFr Bld 7.4 (H) 4.0 - 6.0 %  Glucose, capillary  Result Value Ref Range   Glucose-Capillary 165 (H) 65 - 99 mg/dL   Comment 1 Notify RN   Glucose, capillary  Result Value Ref Range   Glucose-Capillary 117 (H) 65 - 99 mg/dL   Comment 1 Notify RN       Assessment & Plan:   Problem List Items Addressed This Visit      Other   Hypokalemia   Relevant Orders   Basic Metabolic Panel (BMET)    Other Visit Diagnoses    Essential hypertension    -  Primary    Continue current regimen with amlodipine, carvedilol, and lasix. BPs under good control since d/c. Cardiology f/u Thursday.     Relevant Medications    amLODipine (NORVASC) 5 MG tablet      Hypokalemia resolving with repletion. Await BMP, continue potassium supplement and current BP regimen.  Patient with continued dizziness that appears to be positional in nature - likely will improve some as electrolytes resolve but will send Meclizine in for potential symptomatic relief.    Follow up plan: No Follow-up on file.  Follow up as scheduled  with Cardiology Thursday and with [place] as needed.

## 2016-03-18 ENCOUNTER — Telehealth: Payer: Self-pay | Admitting: Family Medicine

## 2016-03-18 DIAGNOSIS — Z8639 Personal history of other endocrine, nutritional and metabolic disease: Secondary | ICD-10-CM

## 2016-03-18 LAB — BASIC METABOLIC PANEL
BUN/Creatinine Ratio: 15 (ref 12–28)
BUN: 11 mg/dL (ref 8–27)
CALCIUM: 9.2 mg/dL (ref 8.7–10.3)
CO2: 24 mmol/L (ref 18–29)
CREATININE: 0.73 mg/dL (ref 0.57–1.00)
Chloride: 97 mmol/L (ref 96–106)
GFR, EST AFRICAN AMERICAN: 99 mL/min/{1.73_m2} (ref >59)
GFR, EST NON AFRICAN AMERICAN: 85 mL/min/{1.73_m2} (ref >59)
Glucose: 123 mg/dL — ABNORMAL HIGH (ref 65–99)
Potassium: 4.5 mmol/L (ref 3.5–5.2)
Sodium: 139 mmol/L (ref 134–144)

## 2016-03-18 NOTE — Telephone Encounter (Signed)
Please call pt and let her know that her potassium has returned to normal and that she can d/c the potassium supplement. I will place a future order for a repeat BMP in one month that she can drop by and have drawn at her convenience in that timeframe. Thank you!

## 2016-03-18 NOTE — Telephone Encounter (Signed)
Patient notified

## 2016-03-19 DIAGNOSIS — E119 Type 2 diabetes mellitus without complications: Secondary | ICD-10-CM | POA: Diagnosis not present

## 2016-03-19 DIAGNOSIS — F17200 Nicotine dependence, unspecified, uncomplicated: Secondary | ICD-10-CM | POA: Diagnosis not present

## 2016-03-19 DIAGNOSIS — R0602 Shortness of breath: Secondary | ICD-10-CM | POA: Diagnosis not present

## 2016-03-19 DIAGNOSIS — E785 Hyperlipidemia, unspecified: Secondary | ICD-10-CM | POA: Diagnosis not present

## 2016-03-19 DIAGNOSIS — I5022 Chronic systolic (congestive) heart failure: Secondary | ICD-10-CM | POA: Diagnosis not present

## 2016-03-19 DIAGNOSIS — I2510 Atherosclerotic heart disease of native coronary artery without angina pectoris: Secondary | ICD-10-CM | POA: Diagnosis not present

## 2016-03-19 DIAGNOSIS — I429 Cardiomyopathy, unspecified: Secondary | ICD-10-CM | POA: Diagnosis not present

## 2016-03-19 DIAGNOSIS — E669 Obesity, unspecified: Secondary | ICD-10-CM | POA: Diagnosis not present

## 2016-03-19 DIAGNOSIS — I10 Essential (primary) hypertension: Secondary | ICD-10-CM | POA: Diagnosis not present

## 2016-03-19 DIAGNOSIS — J449 Chronic obstructive pulmonary disease, unspecified: Secondary | ICD-10-CM | POA: Diagnosis not present

## 2016-03-19 DIAGNOSIS — R600 Localized edema: Secondary | ICD-10-CM | POA: Diagnosis not present

## 2016-03-19 DIAGNOSIS — E876 Hypokalemia: Secondary | ICD-10-CM | POA: Diagnosis not present

## 2016-03-24 ENCOUNTER — Telehealth: Payer: Self-pay | Admitting: Family Medicine

## 2016-03-24 NOTE — Telephone Encounter (Signed)
Pt needs refills sent to [facility] as she is no longer using [facility]  Atorvastatin Furosamide Carvedilol  [facility]

## 2016-03-26 MED ORDER — CARVEDILOL 12.5 MG PO TABS: 12.5000 mg | ORAL_TABLET | Freq: Two times a day (BID) | ORAL | 1 refills | Status: DC

## 2016-03-26 MED ORDER — ATORVASTATIN CALCIUM 40 MG PO TABS: 40.0000 mg | ORAL_TABLET | Freq: Every day | ORAL | 1 refills | Status: DC

## 2016-03-26 NOTE — Telephone Encounter (Signed)
Rxs sent to her pharmacy 

## 2016-03-27 ENCOUNTER — Other Ambulatory Visit: Payer: Self-pay | Admitting: Family Medicine

## 2016-03-27 MED ORDER — FUROSEMIDE 40 MG PO TABS: 40.0000 mg | ORAL_TABLET | Freq: Every day | ORAL | 0 refills | Status: DC

## 2016-03-27 NOTE — Telephone Encounter (Signed)
Pt would like refill for furosemide (LASIX) 40 MG tablet to be sent to [facility]. She stated she was completely out.

## 2016-03-27 NOTE — Telephone Encounter (Signed)
Sent!

## 2016-04-08 ENCOUNTER — Ambulatory Visit (INDEPENDENT_AMBULATORY_CARE_PROVIDER_SITE_OTHER): Payer: Medicare Other | Admitting: Family Medicine

## 2016-04-08 ENCOUNTER — Encounter: Payer: Self-pay | Admitting: Family Medicine

## 2016-04-08 VITALS — BP 142/72 | HR 89 | Temp 98.2°F | Wt 191.0 lb

## 2016-04-08 DIAGNOSIS — M6780 Other specified disorders of synovium and tendon, unspecified site: Secondary | ICD-10-CM

## 2016-04-08 DIAGNOSIS — R609 Edema, unspecified: Secondary | ICD-10-CM

## 2016-04-08 DIAGNOSIS — I129 Hypertensive chronic kidney disease with stage 1 through stage 4 chronic kidney disease, or unspecified chronic kidney disease: Secondary | ICD-10-CM

## 2016-04-08 NOTE — Progress Notes (Signed)
BP (!) 142/72   Pulse 89   Temp 98.2 F (36.8 C)   Wt 191 lb (86.6 kg)   SpO2 93%   BMI 32.79 kg/m    Subjective:    [name], female    DOB: November 03, 1945, 70 y.o.   MRN: [id number]  HPI: Sarah White is a 70 y.o. female  Chief Complaint  Patient presents with  . Hypertension    Patient states that she does not take her Amlodipine daily because it causes her feet to swell  . Edema  . Mass    Patient has a lump on her elbow and bottom of her foot   HYPERTENSION Hypertension status: uncontrolled  Satisfied with current treatment? no Duration of hypertension: chronic BP monitoring frequency:  not checking BP medication side effects:  yes- swelling on the amlodipine Medication compliance: fair compliance Aspirin: yes Recurrent headaches: no Visual changes: no Palpitations: no Dyspnea: no Chest pain: no Lower extremity edema: no Dizzy/lightheaded: no  Relevant past medical, surgical, family and social history reviewed and updated as indicated. Interim medical history since our last visit reviewed. Allergies and medications reviewed and updated.  Review of Systems  Constitutional: Negative.   Respiratory: Negative.   Cardiovascular: Negative.   Psychiatric/Behavioral: Negative.     Per HPI unless specifically indicated above     Objective:    BP (!) 142/72   Pulse 89   Temp 98.2 F (36.8 C)   Wt 191 lb (86.6 kg)   SpO2 93%   BMI 32.79 kg/m   Wt Readings from Last 3 Encounters:  04/08/16 191 lb (86.6 kg)  03/17/16 193 lb (87.5 kg)  03/11/16 190 lb (86.2 kg)    Physical Exam  Constitutional: She is oriented to person, place, and time. She appears well-developed and well-nourished. No distress.  HENT:  Head: Normocephalic and atraumatic.  Right Ear: Hearing normal.  Left Ear: Hearing normal.  Nose: Nose normal.  Eyes: Conjunctivae and lids are normal. Right eye exhibits no discharge. Left eye exhibits no discharge. No scleral  icterus.  Cardiovascular: Normal rate, regular rhythm, normal heart sounds and intact distal pulses.  Exam reveals no gallop and no friction rub.   No murmur heard. Pulmonary/Chest: Effort normal and breath sounds normal. No respiratory distress. She has no wheezes. She has no rales. She exhibits no tenderness.  Musculoskeletal: Normal range of motion.  Cysts on the bottom of her foot  Neurological: She is alert and oriented to person, place, and time.  Skin: Skin is warm, dry and intact. No rash noted. No erythema. No pallor.  Psychiatric: She has a normal mood and affect. Her speech is normal and behavior is normal. Judgment and thought content normal. Cognition and memory are normal.  Nursing note and vitals reviewed.   Results for orders placed or performed in visit on [id number]  Basic Metabolic Panel (BMET)  Result Value Ref Range   Glucose 123 (H) 65 - 99 mg/dL   BUN 11 8 - 27 mg/dL   Creatinine, Ser 0.73 0.57 - 1.00 mg/dL   GFR calc non Af Amer 85 >59 mL/min/1.73   GFR calc Af Amer 99 >59 mL/min/1.73   BUN/Creatinine Ratio 15 12 - 28   Sodium 139 134 - 144 mmol/L   Potassium 4.5 3.5 - 5.2 mmol/L   Chloride 97 96 - 106 mmol/L   CO2 24 18 - 29 mmol/L   Calcium 9.2 8.7 - 10.3 mg/dL  Assessment & Plan:   Problem List Items Addressed This Visit      Genitourinary   Hypertensive renal disease - Primary    Not under good control. Will increase her carvedilol to 25mg  BID and recheck in 1 month.        Other Visit Diagnoses    Cyst of tendon sheath       Of her foot. She will let us know if becoming painful and we will get her into see podiatry   Edema, unspecified type       Due to the amlodipine. Will stop it and see how she does       Follow up plan: Return in about 4 weeks (around 05/06/2016) for BP follow up.

## 2016-04-08 NOTE — Assessment & Plan Note (Signed)
Not under good control. Will increase her carvedilol to 25mg  BID and recheck in 1 month.

## 2016-05-07 ENCOUNTER — Ambulatory Visit (INDEPENDENT_AMBULATORY_CARE_PROVIDER_SITE_OTHER): Payer: Medicare Other | Admitting: Family Medicine

## 2016-05-07 ENCOUNTER — Encounter: Payer: Self-pay | Admitting: Family Medicine

## 2016-05-07 VITALS — BP 173/93 | HR 90 | Temp 98.6°F | Wt 193.0 lb

## 2016-05-07 DIAGNOSIS — I129 Hypertensive chronic kidney disease with stage 1 through stage 4 chronic kidney disease, or unspecified chronic kidney disease: Secondary | ICD-10-CM | POA: Diagnosis not present

## 2016-05-07 DIAGNOSIS — K529 Noninfective gastroenteritis and colitis, unspecified: Secondary | ICD-10-CM | POA: Diagnosis not present

## 2016-05-07 DIAGNOSIS — J029 Acute pharyngitis, unspecified: Secondary | ICD-10-CM | POA: Diagnosis not present

## 2016-05-07 DIAGNOSIS — J069 Acute upper respiratory infection, unspecified: Secondary | ICD-10-CM | POA: Diagnosis not present

## 2016-05-07 MED ORDER — ONDANSETRON 4 MG PO TBDP: 4.0000 mg | ORAL_TABLET | Freq: Three times a day (TID) | ORAL | 0 refills | Status: DC | PRN

## 2016-05-07 NOTE — Patient Instructions (Signed)
Do not take your lasix or your invokanna until you can hold down food

## 2016-05-07 NOTE — Assessment & Plan Note (Signed)
Not able to take her medicine today. Continue to monitor and recheck at follow up.

## 2016-05-07 NOTE — Progress Notes (Signed)
BP (!) 173/93 (BP Location: Left Arm, Patient Position: Sitting, Cuff Size: Large)   Pulse 90   Temp 98.6 F (37 C)   Wt 193 lb (87.5 kg)   SpO2 94%   BMI 33.13 kg/m    Subjective:    [name], female    DOB: 1946-05-30, 70 y.o.   MRN: [id number]  HPI: Sarah White is a 70 y.o. female  Chief Complaint  Patient presents with  . Emesis  . URI  . Cough   UPPER RESPIRATORY TRACT INFECTION Duration: 1 day Worst symptom: sore throat, throwing up Fever: no Cough: yes Shortness of breath: no Wheezing: no Chest pain: no Chest tightness: no Chest congestion: no Nasal congestion: no Runny nose: yes Post nasal drip: no Sneezing: yes Sore throat: yes Swollen glands: no Sinus pressure: no Headache: yes Face pain: no Toothache: no Ear pain: no  Ear pressure: no  Eyes red/itching:no Eye drainage/crusting: no  Vomiting: yes Rash: no Fatigue: yes Sick contacts: yes Strep contacts: no  Context: worse Recurrent sinusitis: no Relief with OTC cold/cough medications: no  Treatments attempted: none   Relevant past medical, surgical, family and social history reviewed and updated as indicated. Interim medical history since our last visit reviewed. Allergies and medications reviewed and updated.  Review of Systems  Constitutional: Negative.   HENT: Positive for postnasal drip, sneezing and sore throat. Negative for congestion, dental problem, drooling, ear discharge, ear pain, facial swelling, hearing loss, mouth sores, nosebleeds, rhinorrhea, sinus pressure, tinnitus, trouble swallowing and voice change.   Respiratory: Negative.   Cardiovascular: Negative.   Gastrointestinal: Positive for nausea and vomiting. Negative for abdominal distention, abdominal pain, anal bleeding, blood in stool, constipation, diarrhea and rectal pain.  Psychiatric/Behavioral: Negative.     Per HPI unless specifically indicated above     Objective:    BP (!) 173/93 (BP  Location: Left Arm, Patient Position: Sitting, Cuff Size: Large)   Pulse 90   Temp 98.6 F (37 C)   Wt 193 lb (87.5 kg)   SpO2 94%   BMI 33.13 kg/m   Wt Readings from Last 3 Encounters:  05/07/16 193 lb (87.5 kg)  04/08/16 191 lb (86.6 kg)  03/17/16 193 lb (87.5 kg)    Physical Exam  Constitutional: She is oriented to person, place, and time. She appears well-developed and well-nourished. No distress.  HENT:  Head: Normocephalic and atraumatic.  Right Ear: Hearing and external ear normal.  Left Ear: Hearing and external ear normal.  Nose: Nose normal.  Mouth/Throat: Oropharynx is clear and moist. No oropharyngeal exudate.  Eyes: Conjunctivae, EOM and lids are normal. Pupils are equal, round, and reactive to light. Right eye exhibits no discharge. Left eye exhibits no discharge. No scleral icterus.  Neck: Normal range of motion. Neck supple. No JVD present. No tracheal deviation present. No thyromegaly present.  Cardiovascular: Normal rate, regular rhythm and intact distal pulses.  Exam reveals no gallop and no friction rub.   No murmur heard. Pulmonary/Chest: Effort normal and breath sounds normal. No stridor. No respiratory distress. She has no wheezes. She has no rales. She exhibits no tenderness.  Musculoskeletal: Normal range of motion.  Lymphadenopathy:    She has no cervical adenopathy.  Neurological: She is alert and oriented to person, place, and time.  Skin: Skin is warm, dry and intact. No rash noted. She is not diaphoretic. No erythema. No pallor.  Psychiatric: She has a normal mood and affect. Her speech  is normal and behavior is normal. Judgment and thought content normal. Cognition and memory are normal.  Nursing note and vitals reviewed.   Results for orders placed or performed in visit on [id number]  Basic Metabolic Panel (BMET)  Result Value Ref Range   Glucose 123 (H) 65 - 99 mg/dL   BUN 11 8 - 27 mg/dL   Creatinine, Ser 0.73 0.57 - 1.00 mg/dL   GFR calc non  Af Amer 85 >59 mL/min/1.73   GFR calc Af Amer 99 >59 mL/min/1.73   BUN/Creatinine Ratio 15 12 - 28   Sodium 139 134 - 144 mmol/L   Potassium 4.5 3.5 - 5.2 mmol/L   Chloride 97 96 - 106 mmol/L   CO2 24 18 - 29 mmol/L   Calcium 9.2 8.7 - 10.3 mg/dL      Assessment & Plan:   Problem List Items Addressed This Visit      Genitourinary   Hypertensive renal disease    Not able to take her medicine today. Continue to monitor and recheck at follow up.       Other Visit Diagnoses    Upper respiratory infection    -  Primary   Likely viral. Rest. Fluids. Call with any concerns. Recheck by phone early next week. Warning signs for going to ER discussed today.   Gastroenteritis       Likely viral. Watch hydration with diuretic. Start zofran. Call if not getting better or getting worse.    Pharyngitis       Will check strep test- Negative. Likely viral.   Relevant Orders   Rapid strep screen (not at [facility])       Follow up plan: Return As scheduled.

## 2016-05-10 LAB — RAPID STREP SCREEN (MED CTR MEBANE ONLY): Strep Gp A Ag, IA W/Reflex: NEGATIVE

## 2016-05-10 LAB — CULTURE, GROUP A STREP: Strep A Culture: NEGATIVE

## 2016-05-11 ENCOUNTER — Ambulatory Visit: Payer: Medicare Other | Admitting: Family Medicine

## 2016-05-11 ENCOUNTER — Telehealth: Payer: Self-pay | Admitting: Family Medicine

## 2016-05-11 MED ORDER — PREDNISONE 50 MG PO TABS: 50.0000 mg | ORAL_TABLET | Freq: Every day | ORAL | 0 refills | Status: DC

## 2016-05-11 NOTE — Telephone Encounter (Signed)
Called to see if Sarah White is feeling any better. Nausea is gone, but now having problems with increased congestion and drainage. Rx for prednisone sent through. Will call later this week if not feeling better.

## 2016-05-11 NOTE — Telephone Encounter (Signed)
-----   Message from Valerie Roys, DO sent at 05/07/2016  1:31 PM EDT ----- Call to see how she's feeling

## 2016-05-14 ENCOUNTER — Telehealth: Payer: Self-pay | Admitting: Family Medicine

## 2016-05-14 MED ORDER — AMOXICILLIN-POT CLAVULANATE 875-125 MG PO TABS: 1.0000 | ORAL_TABLET | Freq: Two times a day (BID) | ORAL | 0 refills | Status: DC

## 2016-05-14 NOTE — Telephone Encounter (Signed)
Rx sent to her pharmacy 

## 2016-05-14 NOTE — Telephone Encounter (Signed)
Patient notified

## 2016-05-14 NOTE — Telephone Encounter (Signed)
Patient states that she has finished the prednisone, but she is still congested, it is not breaking up at all. She would like to know if you can give her an antibiotic to try and get this to break up

## 2016-05-14 NOTE — Telephone Encounter (Signed)
Pt called would like a call back from Dr. Wynetta Emery. Stated she believes she may need an antibiotic. Pharm is Paediatric nurse on [address] Please call pt with any questions. Thanks.

## 2016-05-18 ENCOUNTER — Encounter: Payer: Self-pay | Admitting: Family Medicine

## 2016-05-18 ENCOUNTER — Ambulatory Visit (INDEPENDENT_AMBULATORY_CARE_PROVIDER_SITE_OTHER): Payer: Medicare Other | Admitting: Family Medicine

## 2016-05-18 VITALS — BP 125/77 | HR 82 | Wt 192.0 lb

## 2016-05-18 DIAGNOSIS — N183 Chronic kidney disease, stage 3 (moderate): Secondary | ICD-10-CM | POA: Diagnosis not present

## 2016-05-18 DIAGNOSIS — Z23 Encounter for immunization: Secondary | ICD-10-CM | POA: Diagnosis not present

## 2016-05-18 DIAGNOSIS — IMO0001 Reserved for inherently not codable concepts without codable children: Secondary | ICD-10-CM

## 2016-05-18 DIAGNOSIS — I129 Hypertensive chronic kidney disease with stage 1 through stage 4 chronic kidney disease, or unspecified chronic kidney disease: Secondary | ICD-10-CM | POA: Diagnosis not present

## 2016-05-18 DIAGNOSIS — E1122 Type 2 diabetes mellitus with diabetic chronic kidney disease: Secondary | ICD-10-CM

## 2016-05-18 DIAGNOSIS — E1165 Type 2 diabetes mellitus with hyperglycemia: Secondary | ICD-10-CM

## 2016-05-18 LAB — BAYER DCA HB A1C WAIVED: HB A1C: 6.8 % (ref <7.0)

## 2016-05-18 MED ORDER — BENAZEPRIL HCL 10 MG PO TABS: ORAL_TABLET | ORAL | 3 refills | Status: DC

## 2016-05-18 NOTE — Assessment & Plan Note (Signed)
Under good control. Continue current regimen. Continue to monitor. Recheck 3 months.

## 2016-05-18 NOTE — Assessment & Plan Note (Signed)
Not feeling well on her medicine. Making her dizzy. Unable to tolerate amlodipine, HCTZ, or lisinopril due to dizziness. Not orthostatic on the lisinopril. Will try other ACE (benazipril) and recheck in 1 month.

## 2016-05-18 NOTE — Progress Notes (Signed)
BP 125/77 (BP Location: Left Arm, Patient Position: Sitting, Cuff Size: Large)   Pulse 82   Wt 192 lb (87.1 kg)   SpO2 92%   BMI 32.96 kg/m    Subjective:    [name], female    DOB: Mar 18, 1946, 70 y.o.   MRN: [id number]  HPI: Sarah White is a 70 y.o. female  Chief Complaint  Patient presents with  . Diabetes  . Hypertension   DIABETES Hypoglycemic episodes:no Polydipsia/polyuria: no Visual disturbance: no Chest pain: no Paresthesias: no Glucose Monitoring: yes  Accucheck frequency: Daily Taking Insulin?: no Blood Pressure Monitoring: daily Retinal Examination: Up to Date Foot Exam: Up to Date Diabetic Education: Completed Pneumovax: Up to Date Influenza: Up to Date Aspirin: yes  HYPERTENSION- taking 3 carvedilol (2 in the AM and 1 at lunch) 1 5mg  lisinopril  Hypertension status: controlled- only because she's taking her lisinopril Satisfied with current treatment? no Duration of hypertension: chronic BP monitoring frequency:  daily BP medication side effects:  yes Medication compliance: fair compliance Previous BP meds: HCTZ, lisinopril, amlodipine, carvedilol Aspirin: yes Recurrent headaches: no Visual changes: no Palpitations: no Dyspnea: no Chest pain: no Lower extremity edema: no Dizzy/lightheaded: yes  Relevant past medical, surgical, family and social history reviewed and updated as indicated. Interim medical history since our last visit reviewed. Allergies and medications reviewed and updated.  Review of Systems  Constitutional: Negative.   Respiratory: Negative.   Cardiovascular: Negative.   Neurological: Positive for dizziness and light-headedness. Negative for tremors, seizures, syncope, facial asymmetry, speech difficulty, weakness, numbness and headaches.  Psychiatric/Behavioral: Negative.     Per HPI unless specifically indicated above     Objective:    BP 125/77 (BP Location: Left Arm, Patient Position:  Sitting, Cuff Size: Large)   Pulse 82   Wt 192 lb (87.1 kg)   SpO2 92%   BMI 32.96 kg/m   Wt Readings from Last 3 Encounters:  05/18/16 192 lb (87.1 kg)  05/07/16 193 lb (87.5 kg)  04/08/16 191 lb (86.6 kg)    Orthostatic VS for the past 24 hrs:  BP- Lying Pulse- Lying BP- Sitting Pulse- Sitting BP- Standing at 0 minutes Pulse- Standing at 0 minutes  05/18/16 1500 139/82 80 147/84 89 143/82 88   Physical Exam  Constitutional: She is oriented to person, place, and time. She appears well-developed and well-nourished. No distress.  HENT:  Head: Normocephalic and atraumatic.  Right Ear: Hearing normal.  Left Ear: Hearing normal.  Nose: Nose normal.  Eyes: Conjunctivae and lids are normal. Right eye exhibits no discharge. Left eye exhibits no discharge. No scleral icterus.  Cardiovascular: Normal rate, regular rhythm, normal heart sounds and intact distal pulses.  Exam reveals no gallop and no friction rub.   No murmur heard. Pulmonary/Chest: Effort normal and breath sounds normal. No respiratory distress. She has no wheezes. She has no rales. She exhibits no tenderness.  Musculoskeletal: Normal range of motion.  Neurological: She is alert and oriented to person, place, and time.  Skin: Skin is warm, dry and intact. No rash noted. She is not diaphoretic. No erythema. No pallor.  Psychiatric: She has a normal mood and affect. Her speech is normal and behavior is normal. Judgment and thought content normal. Cognition and memory are normal.  Nursing note and vitals reviewed.   Results for orders placed or performed in visit on 05/07/16  Rapid strep screen (not at [facility])  Result Value Ref Range   Strep  Gp A Ag, IA W/Reflex Negative Negative  Culture, Group A Strep  Result Value Ref Range   Strep A Culture Negative       Assessment & Plan:   Problem List Items Addressed This Visit      Endocrine   Diabetes mellitus with chronic kidney disease ([place])    Under good control. Continue  current regimen. Continue to monitor. Recheck 3 months.         Genitourinary   Hypertensive renal disease - Primary    Not feeling well on her medicine. Making her dizzy. Unable to tolerate amlodipine, HCTZ, or lisinopril due to dizziness. Not orthostatic on the lisinopril. Will try other ACE (benazipril) and recheck in 1 month.       Relevant Orders   Comprehensive metabolic panel    Other Visit Diagnoses    Immunization due       Flu shot given   Relevant Orders   Flu vaccine HIGH DOSE PF (Fluzone High dose) (Completed)       Follow up plan: Return in about 4 weeks (around 06/15/2016) for BP follow up.

## 2016-05-19 ENCOUNTER — Encounter: Payer: Self-pay | Admitting: Family Medicine

## 2016-05-19 LAB — COMPREHENSIVE METABOLIC PANEL
A/G RATIO: 2.2 (ref 1.2–2.2)
ALBUMIN: 4 g/dL (ref 3.6–4.8)
ALK PHOS: 99 IU/L (ref 39–117)
ALT: 11 IU/L (ref 0–32)
AST: 7 IU/L (ref 0–40)
BUN / CREAT RATIO: 23 (ref 12–28)
BUN: 16 mg/dL (ref 8–27)
Bilirubin Total: 0.3 mg/dL (ref 0.0–1.2)
CALCIUM: 9.1 mg/dL (ref 8.7–10.3)
CO2: 27 mmol/L (ref 18–29)
CREATININE: 0.71 mg/dL (ref 0.57–1.00)
Chloride: 97 mmol/L (ref 96–106)
GFR calc Af Amer: 102 mL/min/{1.73_m2} (ref >59)
GFR, EST NON AFRICAN AMERICAN: 88 mL/min/{1.73_m2} (ref >59)
GLOBULIN, TOTAL: 1.8 g/dL (ref 1.5–4.5)
Glucose: 119 mg/dL — ABNORMAL HIGH (ref 65–99)
Potassium: 4.2 mmol/L (ref 3.5–5.2)
SODIUM: 140 mmol/L (ref 134–144)
Total Protein: 5.8 g/dL — ABNORMAL LOW (ref 6.0–8.5)

## 2016-06-01 ENCOUNTER — Telehealth: Payer: Self-pay | Admitting: Family Medicine

## 2016-06-01 NOTE — Telephone Encounter (Signed)
She notes that she takes the benazepril and her BP goes. She went down to 83/59- so she stopped it. Will have her monitor her BP but stay off benazepril for now. May have to send her back to cardiology/renal for BP management due to allergies.

## 2016-06-01 NOTE — Telephone Encounter (Signed)
-----   Message from Valerie Roys, [place] sent at 05/18/2016  3:06 PM EDT ----- Call her about her blood pressure

## 2016-06-15 ENCOUNTER — Encounter: Payer: Self-pay | Admitting: Family Medicine

## 2016-06-15 ENCOUNTER — Ambulatory Visit (INDEPENDENT_AMBULATORY_CARE_PROVIDER_SITE_OTHER): Payer: Medicare Other | Admitting: Family Medicine

## 2016-06-15 VITALS — BP 120/76 | HR 85 | Temp 98.6°F | Wt 191.8 lb

## 2016-06-15 DIAGNOSIS — I129 Hypertensive chronic kidney disease with stage 1 through stage 4 chronic kidney disease, or unspecified chronic kidney disease: Secondary | ICD-10-CM

## 2016-06-15 MED ORDER — CANAGLIFLOZIN 100 MG PO TABS: 100.0000 mg | ORAL_TABLET | Freq: Every day | ORAL | 1 refills | Status: DC

## 2016-06-15 MED ORDER — CARVEDILOL 12.5 MG PO TABS: 12.5000 mg | ORAL_TABLET | Freq: Two times a day (BID) | ORAL | 1 refills | Status: DC

## 2016-06-15 MED ORDER — DULAGLUTIDE 0.75 MG/0.5ML ~~LOC~~ SOAJ: 0.7500 mg | SUBCUTANEOUS | 6 refills | Status: DC

## 2016-06-15 MED ORDER — FUROSEMIDE 40 MG PO TABS: 40.0000 mg | ORAL_TABLET | Freq: Every day | ORAL | 1 refills | Status: DC

## 2016-06-15 MED ORDER — ATORVASTATIN CALCIUM 40 MG PO TABS: 40.0000 mg | ORAL_TABLET | Freq: Every day | ORAL | 1 refills | Status: DC

## 2016-06-15 NOTE — Progress Notes (Signed)
BP 120/76 (BP Location: Left Arm, Patient Position: Sitting, Cuff Size: Large)   Pulse 85   Temp 98.6 F (37 C)   Wt 191 lb 12.8 oz (87 kg)   SpO2 94%   BMI 32.92 kg/m    Subjective:    [name], female    DOB: 1946/04/05, 70 y.o.   MRN: [id number]  HPI: Sarah White is a 70 y.o. female  Chief Complaint  Patient presents with  . Hypertension  . rx refill    trulicity   HYPERTENSION Hypertension status: controlled  Satisfied with current treatment? yes Duration of hypertension: chronic BP monitoring frequency:  a few times a week BP medication side effects:  no Medication compliance: excellent compliance Aspirin: yes Recurrent headaches: no Visual changes: no Palpitations: no Dyspnea: no Chest pain: no Lower extremity edema: no Dizzy/lightheaded: yes  Relevant past medical, surgical, family and social history reviewed and updated as indicated. Interim medical history since our last visit reviewed. Allergies and medications reviewed and updated.  Review of Systems  Constitutional: Negative.   Respiratory: Negative.   Cardiovascular: Negative.   Psychiatric/Behavioral: Negative.     Per HPI unless specifically indicated above     Objective:    BP 120/76 (BP Location: Left Arm, Patient Position: Sitting, Cuff Size: Large)   Pulse 85   Temp 98.6 F (37 C)   Wt 191 lb 12.8 oz (87 kg)   SpO2 94%   BMI 32.92 kg/m   Wt Readings from Last 3 Encounters:  06/15/16 191 lb 12.8 oz (87 kg)  05/18/16 192 lb (87.1 kg)  05/07/16 193 lb (87.5 kg)    Physical Exam  Constitutional: She is oriented to person, place, and time. She appears well-developed and well-nourished. No distress.  HENT:  Head: Normocephalic and atraumatic.  Right Ear: Hearing normal.  Left Ear: Hearing normal.  Nose: Nose normal.  Eyes: Conjunctivae and lids are normal. Right eye exhibits no discharge. Left eye exhibits no discharge. No scleral icterus.  Cardiovascular:  Normal rate, regular rhythm, normal heart sounds and intact distal pulses.  Exam reveals no gallop and no friction rub.   No murmur heard. Pulmonary/Chest: Effort normal and breath sounds normal. No respiratory distress. She has no wheezes. She has no rales. She exhibits no tenderness.  Musculoskeletal: Normal range of motion.  Neurological: She is alert and oriented to person, place, and time.  Skin: Skin is warm, dry and intact. No rash noted. No erythema. No pallor.  Psychiatric: She has a normal mood and affect. Her speech is normal and behavior is normal. Judgment and thought content normal. Cognition and memory are normal.  Nursing note and vitals reviewed.   Results for orders placed or performed in visit on 05/18/16  Comprehensive metabolic panel  Result Value Ref Range   Glucose 119 (H) 65 - 99 mg/dL   BUN 16 8 - 27 mg/dL   Creatinine, Ser 0.71 0.57 - 1.00 mg/dL   GFR calc non Af Amer 88 >59 mL/min/1.73   GFR calc Af Amer 102 >59 mL/min/1.73   BUN/Creatinine Ratio 23 12 - 28   Sodium 140 134 - 144 mmol/L   Potassium 4.2 3.5 - 5.2 mmol/L   Chloride 97 96 - 106 mmol/L   CO2 27 18 - 29 mmol/L   Calcium 9.1 8.7 - 10.3 mg/dL   Total Protein 5.8 (L) 6.0 - 8.5 g/dL   Albumin 4.0 3.6 - 4.8 g/dL   Globulin, Total 1.8  1.5 - 4.5 g/dL   Albumin/Globulin Ratio 2.2 1.2 - 2.2   Bilirubin Total 0.3 0.0 - 1.2 mg/dL   Alkaline Phosphatase 99 39 - 117 IU/L   AST 7 0 - 40 IU/L   ALT 11 0 - 32 IU/L  Bayer DCA Hb A1c Waived  Result Value Ref Range   Bayer DCA Hb A1c Waived 6.8 <7.0 %      Assessment & Plan:   Problem List Items Addressed This Visit      Genitourinary   Hypertensive renal disease    Under good control. Continue current regimen. Continue to monitor. Call if BP in the 140s consistently.       Other Visit Diagnoses   None.      Follow up plan: Return Middle to end of December, for DM visit with A1c.

## 2016-06-15 NOTE — Assessment & Plan Note (Signed)
Under good control. Continue current regimen. Continue to monitor. Call if BP in the 140s consistently.

## 2016-08-05 ENCOUNTER — Telehealth: Payer: Self-pay | Admitting: Family Medicine

## 2016-08-06 MED ORDER — CARVEDILOL 12.5 MG PO TABS: 25.0000 mg | ORAL_TABLET | Freq: Two times a day (BID) | ORAL | 1 refills | Status: DC

## 2016-08-06 NOTE — Telephone Encounter (Signed)
Patient states that she is taking two in the morning and two at lunch, and that her prescription was not updated.

## 2016-08-06 NOTE — Telephone Encounter (Signed)
Updated Rx sent to her pharmacy

## 2016-08-18 ENCOUNTER — Ambulatory Visit: Payer: Medicare Other | Admitting: Family Medicine

## 2016-09-03 ENCOUNTER — Ambulatory Visit: Payer: Medicare Other | Admitting: Family Medicine

## 2016-09-15 ENCOUNTER — Encounter: Payer: Self-pay | Admitting: Family Medicine

## 2016-09-15 ENCOUNTER — Ambulatory Visit (INDEPENDENT_AMBULATORY_CARE_PROVIDER_SITE_OTHER): Payer: Medicare Other | Admitting: Family Medicine

## 2016-09-15 VITALS — BP 150/70 | HR 80 | Temp 98.2°F | Wt 198.1 lb

## 2016-09-15 DIAGNOSIS — E1122 Type 2 diabetes mellitus with diabetic chronic kidney disease: Secondary | ICD-10-CM

## 2016-09-15 DIAGNOSIS — N183 Chronic kidney disease, stage 3 (moderate): Secondary | ICD-10-CM | POA: Diagnosis not present

## 2016-09-15 DIAGNOSIS — Z23 Encounter for immunization: Secondary | ICD-10-CM

## 2016-09-15 DIAGNOSIS — G4700 Insomnia, unspecified: Secondary | ICD-10-CM | POA: Diagnosis not present

## 2016-09-15 DIAGNOSIS — I129 Hypertensive chronic kidney disease with stage 1 through stage 4 chronic kidney disease, or unspecified chronic kidney disease: Secondary | ICD-10-CM

## 2016-09-15 LAB — MICROALBUMIN, URINE WAIVED
CREATININE, URINE WAIVED: 50 mg/dL (ref 10–300)
MICROALB, UR WAIVED: 10 mg/L (ref 0–19)

## 2016-09-15 LAB — BAYER DCA HB A1C WAIVED: HB A1C: 9.5 % — AB (ref <7.0)

## 2016-09-15 NOTE — Assessment & Plan Note (Signed)
Has been out of her medicine for 1.5 months. A1c at 9.5 Will get her back on her medicine and recheck in 1 month. Call with any concerns.

## 2016-09-15 NOTE — Assessment & Plan Note (Signed)
Was off invokanna and trulicity for 1.5 months- may be what's causing BP to go up. Just back on them now. Will recheck BP in 1 month.

## 2016-09-15 NOTE — Progress Notes (Signed)
BP (!) 150/70   Pulse 80   Temp 98.2 F (36.8 C)   Wt 198 lb 1.6 oz (89.9 kg)   SpO2 94%   BMI 34.00 kg/m    Subjective:    [name], female    DOB: 1946-03-06, 71 y.o.   MRN: [id number]  HPI: Sarah White is a 71 y.o. female  Chief Complaint  Patient presents with  . Diabetes    Patient states that she was out of her Trulicity and Invokana for a month and a half, she has been back on it for 1 week.   DIABETES Hypoglycemic episodes:no Polydipsia/polyuria: no Visual disturbance: no Chest pain: no Paresthesias: no Glucose Monitoring: yes  Accucheck frequency: Daily Taking Insulin?: no Blood Pressure Monitoring: not checking Retinal Examination: Up to Date Foot Exam: Up to Date Diabetic Education: Completed Pneumovax: Not Up to Date- administered today Influenza: Up to Date Aspirin: yes  INSOMNIA Duration: chronic Satisfied with sleep quality: no Difficulty falling asleep: yes Difficulty staying asleep: yes Waking a few hours after sleep onset: yes Early morning awakenings: yes Daytime hypersomnolence: yes Wakes feeling refreshed: no Good sleep hygiene: no Apnea: no Snoring: yes Depressed/anxious mood: no Recent stress: no Restless legs/nocturnal leg cramps: no Chronic pain/arthritis: no History of sleep study: no Treatments attempted: uinsom    Relevant past medical, surgical, family and social history reviewed and updated as indicated. Interim medical history since our last visit reviewed. Allergies and medications reviewed and updated.  Review of Systems  Constitutional: Negative.   Respiratory: Negative.   Cardiovascular: Negative.   Psychiatric/Behavioral: Positive for sleep disturbance. Negative for agitation, behavioral problems, confusion, decreased concentration, dysphoric mood, hallucinations, self-injury and suicidal ideas. The patient is not nervous/anxious and is not hyperactive.     Per HPI unless specifically  indicated above     Objective:    BP (!) 150/70   Pulse 80   Temp 98.2 F (36.8 C)   Wt 198 lb 1.6 oz (89.9 kg)   SpO2 94%   BMI 34.00 kg/m   Wt Readings from Last 3 Encounters:  09/15/16 198 lb 1.6 oz (89.9 kg)  06/15/16 191 lb 12.8 oz (87 kg)  05/18/16 192 lb (87.1 kg)    Physical Exam  Constitutional: She is oriented to person, place, and time. She appears well-developed and well-nourished. No distress.  HENT:  Head: Normocephalic and atraumatic.  Right Ear: Hearing normal.  Left Ear: Hearing normal.  Nose: Nose normal.  Eyes: Conjunctivae and lids are normal. Right eye exhibits no discharge. Left eye exhibits no discharge. No scleral icterus.  Cardiovascular: Normal rate, regular rhythm, normal heart sounds and intact distal pulses.  Exam reveals no gallop and no friction rub.   No murmur heard. Pulmonary/Chest: Effort normal and breath sounds normal. No respiratory distress. She has no wheezes. She has no rales. She exhibits no tenderness.  Musculoskeletal: Normal range of motion.  Neurological: She is alert and oriented to person, place, and time.  Skin: Skin is warm, dry and intact. No rash noted. No erythema. No pallor.  Psychiatric: She has a normal mood and affect. Her speech is normal and behavior is normal. Judgment and thought content normal. Cognition and memory are normal.  Nursing note and vitals reviewed.  Diabetic Foot Exam - Simple   Simple Foot Form Diabetic Foot exam was performed with the following findings:  Yes 09/15/2016  9:40 AM  Visual Inspection No deformities, no ulcerations, no other skin breakdown  bilaterally:  Yes Sensation Testing Intact to touch and monofilament testing bilaterally:  Yes Pulse Check Posterior Tibialis and Dorsalis pulse intact bilaterally:  Yes Comments     Results for orders placed or performed in visit on 05/18/16  Comprehensive metabolic panel  Result Value Ref Range   Glucose 119 (H) 65 - 99 mg/dL   BUN 16 8 -  27 mg/dL   Creatinine, Ser 0.71 0.57 - 1.00 mg/dL   GFR calc non Af Amer 88 >59 mL/min/1.73   GFR calc Af Amer 102 >59 mL/min/1.73   BUN/Creatinine Ratio 23 12 - 28   Sodium 140 134 - 144 mmol/L   Potassium 4.2 3.5 - 5.2 mmol/L   Chloride 97 96 - 106 mmol/L   CO2 27 18 - 29 mmol/L   Calcium 9.1 8.7 - 10.3 mg/dL   Total Protein 5.8 (L) 6.0 - 8.5 g/dL   Albumin 4.0 3.6 - 4.8 g/dL   Globulin, Total 1.8 1.5 - 4.5 g/dL   Albumin/Globulin Ratio 2.2 1.2 - 2.2   Bilirubin Total 0.3 0.0 - 1.2 mg/dL   Alkaline Phosphatase 99 39 - 117 IU/L   AST 7 0 - 40 IU/L   ALT 11 0 - 32 IU/L  Bayer DCA Hb A1c Waived  Result Value Ref Range   Bayer DCA Hb A1c Waived 6.8 <7.0 %      Assessment & Plan:   Problem List Items Addressed This Visit      Endocrine   Diabetes mellitus with chronic kidney disease ([place]) - Primary    Has been out of her medicine for 1.5 months. A1c at 9.5 Will get her back on her medicine and recheck in 1 month. Call with any concerns.       Relevant Orders   Bayer DCA Hb A1c Waived   Microalbumin, Urine Waived     Genitourinary   Hypertensive renal disease    Was off invokanna and trulicity for 1.5 months- may be what's causing BP to go up. Just back on them now. Will recheck BP in 1 month.        Other Visit Diagnoses    Immunization due       Pneumovax given today   Insomnia, unspecified type       Would like to try seroquel- will consider when her sugars are under better control.        Follow up plan: Return in about 4 weeks (around 10/13/2016) for Bp follow up.

## 2016-10-16 ENCOUNTER — Ambulatory Visit: Payer: Medicare Other | Admitting: Family Medicine

## 2016-11-25 DIAGNOSIS — R079 Chest pain, unspecified: Secondary | ICD-10-CM | POA: Diagnosis not present

## 2016-11-26 ENCOUNTER — Inpatient Hospital Stay
Admit: 2016-11-26 | Discharge: 2016-11-26 | Disposition: A | Discharge status: Home / Self Care | Payer: Medicare Other | Attending: Family Medicine | Admitting: Family Medicine

## 2016-11-26 ENCOUNTER — Emergency Department: Payer: Medicare Other

## 2016-11-26 ENCOUNTER — Encounter
Admission: EM | Disposition: A | Discharge status: Home / Self Care | Payer: Self-pay | Source: Home / Self Care | Attending: Internal Medicine

## 2016-11-26 ENCOUNTER — Encounter: Payer: Self-pay | Admitting: Emergency Medicine

## 2016-11-26 ENCOUNTER — Inpatient Hospital Stay
Admission: EM | Admit: 2016-11-26 | Discharge: 2016-11-27 | DRG: 287 | Disposition: A | Discharge status: Home / Self Care | Payer: Medicare Other | Attending: Internal Medicine | Admitting: Internal Medicine

## 2016-11-26 DIAGNOSIS — R0789 Other chest pain: Secondary | ICD-10-CM | POA: Diagnosis not present

## 2016-11-26 DIAGNOSIS — I10 Essential (primary) hypertension: Secondary | ICD-10-CM | POA: Diagnosis not present

## 2016-11-26 DIAGNOSIS — Z7982 Long term (current) use of aspirin: Secondary | ICD-10-CM | POA: Diagnosis not present

## 2016-11-26 DIAGNOSIS — I200 Unstable angina: Secondary | ICD-10-CM | POA: Diagnosis not present

## 2016-11-26 DIAGNOSIS — F17210 Nicotine dependence, cigarettes, uncomplicated: Secondary | ICD-10-CM | POA: Diagnosis present

## 2016-11-26 DIAGNOSIS — Z7984 Long term (current) use of oral hypoglycemic drugs: Secondary | ICD-10-CM

## 2016-11-26 DIAGNOSIS — I2510 Atherosclerotic heart disease of native coronary artery without angina pectoris: Secondary | ICD-10-CM | POA: Diagnosis not present

## 2016-11-26 DIAGNOSIS — E785 Hyperlipidemia, unspecified: Secondary | ICD-10-CM | POA: Diagnosis present

## 2016-11-26 DIAGNOSIS — Z955 Presence of coronary angioplasty implant and graft: Secondary | ICD-10-CM | POA: Diagnosis not present

## 2016-11-26 DIAGNOSIS — Z7951 Long term (current) use of inhaled steroids: Secondary | ICD-10-CM

## 2016-11-26 DIAGNOSIS — J449 Chronic obstructive pulmonary disease, unspecified: Secondary | ICD-10-CM | POA: Diagnosis present

## 2016-11-26 DIAGNOSIS — I110 Hypertensive heart disease with heart failure: Secondary | ICD-10-CM | POA: Diagnosis present

## 2016-11-26 DIAGNOSIS — Z79899 Other long term (current) drug therapy: Secondary | ICD-10-CM

## 2016-11-26 DIAGNOSIS — E119 Type 2 diabetes mellitus without complications: Secondary | ICD-10-CM | POA: Diagnosis not present

## 2016-11-26 DIAGNOSIS — Z66 Do not resuscitate: Secondary | ICD-10-CM | POA: Diagnosis present

## 2016-11-26 DIAGNOSIS — I5022 Chronic systolic (congestive) heart failure: Secondary | ICD-10-CM | POA: Diagnosis present

## 2016-11-26 DIAGNOSIS — Z8542 Personal history of malignant neoplasm of other parts of uterus: Secondary | ICD-10-CM

## 2016-11-26 DIAGNOSIS — I252 Old myocardial infarction: Secondary | ICD-10-CM

## 2016-11-26 DIAGNOSIS — I25110 Atherosclerotic heart disease of native coronary artery with unstable angina pectoris: Principal | ICD-10-CM | POA: Diagnosis present

## 2016-11-26 DIAGNOSIS — R079 Chest pain, unspecified: Secondary | ICD-10-CM

## 2016-11-26 HISTORY — PX: LEFT HEART CATH AND CORONARY ANGIOGRAPHY: CATH118249

## 2016-11-26 LAB — ECHOCARDIOGRAM COMPLETE
HEIGHTINCHES: 64 in
Weight: 3110.4 oz

## 2016-11-26 LAB — HEPARIN LEVEL (UNFRACTIONATED)
HEPARIN UNFRACTIONATED: 0.31 [IU]/mL (ref 0.30–0.70)
HEPARIN UNFRACTIONATED: 0.31 [IU]/mL (ref 0.30–0.70)

## 2016-11-26 LAB — BASIC METABOLIC PANEL
Anion gap: 9 (ref 5–15)
Anion gap: 7 (ref 5–15)
BUN: 13 mg/dL (ref 6–20)
BUN: 13 mg/dL (ref 6–20)
CALCIUM: 8.8 mg/dL — AB (ref 8.9–10.3)
CHLORIDE: 102 mmol/L (ref 101–111)
CO2: 29 mmol/L (ref 22–32)
CO2: 29 mmol/L (ref 22–32)
CREATININE: 0.8 mg/dL (ref 0.44–1.00)
CREATININE: 0.75 mg/dL (ref 0.44–1.00)
Calcium: 8.9 mg/dL (ref 8.9–10.3)
Chloride: 102 mmol/L (ref 101–111)
GFR calc non Af Amer: >60 mL/min (ref >60)
Glucose, Bld: 230 mg/dL — ABNORMAL HIGH (ref 65–99)
Glucose, Bld: 163 mg/dL — ABNORMAL HIGH (ref 65–99)
POTASSIUM: 4 mmol/L (ref 3.5–5.1)
Potassium: 4.1 mmol/L (ref 3.5–5.1)
SODIUM: 138 mmol/L (ref 135–145)
Sodium: 140 mmol/L (ref 135–145)

## 2016-11-26 LAB — CBC
HCT: 45.4 % (ref 35.0–47.0)
HCT: 43.7 % (ref 35.0–47.0)
HEMOGLOBIN: 14.4 g/dL (ref 12.0–16.0)
Hemoglobin: 15.1 g/dL (ref 12.0–16.0)
MCH: 27.1 pg (ref 26.0–34.0)
MCH: 27 pg (ref 26.0–34.0)
MCHC: 33.3 g/dL (ref 32.0–36.0)
MCHC: 33.1 g/dL (ref 32.0–36.0)
MCV: 81.3 fL (ref 80.0–100.0)
MCV: 81.5 fL (ref 80.0–100.0)
PLATELETS: 147 10*3/uL — AB (ref 150–440)
Platelets: 148 10*3/uL — ABNORMAL LOW (ref 150–440)
RBC: 5.59 MIL/uL — AB (ref 3.80–5.20)
RBC: 5.36 MIL/uL — AB (ref 3.80–5.20)
RDW: 15.3 % — ABNORMAL HIGH (ref 11.5–14.5)
RDW: 15.2 % — AB (ref 11.5–14.5)
WBC: 11.2 10*3/uL — ABNORMAL HIGH (ref 3.6–11.0)
WBC: 7.2 10*3/uL (ref 3.6–11.0)

## 2016-11-26 LAB — PROTIME-INR
INR: 1.06
INR: 1.06
PROTHROMBIN TIME: 13.8 s (ref 11.4–15.2)
PROTHROMBIN TIME: 13.8 s (ref 11.4–15.2)

## 2016-11-26 LAB — TROPONIN I
Troponin I: <0.03 ng/mL (ref <0.03)
Troponin I: <0.03 ng/mL (ref <0.03)

## 2016-11-26 LAB — LIPID PANEL
CHOL/HDL RATIO: 4.3 ratio
CHOLESTEROL: 158 mg/dL (ref 0–200)
HDL: 37 mg/dL — ABNORMAL LOW (ref >40)
LDL Cholesterol: 70 mg/dL (ref 0–99)
TRIGLYCERIDES: 254 mg/dL — AB (ref <150)
VLDL: 51 mg/dL — ABNORMAL HIGH (ref 0–40)

## 2016-11-26 LAB — MAGNESIUM: Magnesium: 2.1 mg/dL (ref 1.7–2.4)

## 2016-11-26 LAB — BRAIN NATRIURETIC PEPTIDE: B NATRIURETIC PEPTIDE 5: 52 pg/mL (ref 0.0–100.0)

## 2016-11-26 LAB — APTT: APTT: 46 s — AB (ref 24–36)

## 2016-11-26 LAB — TSH: TSH: 1.992 u[IU]/mL (ref 0.350–4.500)

## 2016-11-26 SURGERY — LEFT HEART CATH AND CORONARY ANGIOGRAPHY
Anesthesia: Moderate Sedation | Laterality: Right

## 2016-11-26 MED ORDER — POTASSIUM CHLORIDE CRYS ER 10 MEQ PO TBCR: 10.0000 meq | EXTENDED_RELEASE_TABLET | Freq: Every day | ORAL | Status: DC

## 2016-11-26 MED ORDER — HEPARIN (PORCINE) IN NACL 100-0.45 UNIT/ML-% IJ SOLN
900.0000 [IU]/h | INTRAMUSCULAR | Status: DC
  Administered 2016-11-26: 900 [IU]/h via INTRAVENOUS
  Filled 2016-11-26: qty 250

## 2016-11-26 MED ORDER — SODIUM CHLORIDE 0.9 % WEIGHT BASED INFUSION: 3.0000 mL/kg/h | INTRAVENOUS | Status: DC

## 2016-11-26 MED ORDER — LIDOCAINE HCL (PF) 1 % IJ SOLN
INTRAMUSCULAR | Status: AC
  Filled 2016-11-26: qty 30

## 2016-11-26 MED ORDER — ASPIRIN EC 81 MG PO TBEC
81.0000 mg | DELAYED_RELEASE_TABLET | Freq: Every day | ORAL | Status: DC
  Administered 2016-11-26: 81 mg via ORAL
  Filled 2016-11-26 (×2): qty 1

## 2016-11-26 MED ORDER — SODIUM CHLORIDE 0.9% FLUSH: 3.0000 mL | INTRAVENOUS | Status: DC | PRN

## 2016-11-26 MED ORDER — SODIUM CHLORIDE 0.9 % WEIGHT BASED INFUSION: 1.0000 mL/kg/h | INTRAVENOUS | Status: DC

## 2016-11-26 MED ORDER — SODIUM CHLORIDE 0.9 % IV SOLN: 250.0000 mL | INTRAVENOUS | Status: DC | PRN

## 2016-11-26 MED ORDER — MECLIZINE HCL 25 MG PO TABS: 12.5000 mg | ORAL_TABLET | Freq: Three times a day (TID) | ORAL | Status: DC | PRN

## 2016-11-26 MED ORDER — SODIUM CHLORIDE 0.9% FLUSH
3.0000 mL | Freq: Two times a day (BID) | INTRAVENOUS | Status: DC
Start: 2016-11-26 — End: 2016-11-27
  Administered 2016-11-27: 3 mL via INTRAVENOUS

## 2016-11-26 MED ORDER — CARVEDILOL 12.5 MG PO TABS
12.5000 mg | ORAL_TABLET | Freq: Two times a day (BID) | ORAL | Status: DC
  Administered 2016-11-26 – 2016-11-27 (×2): 12.5 mg via ORAL
  Filled 2016-11-26 (×2): qty 1

## 2016-11-26 MED ORDER — SODIUM CHLORIDE 0.9 % IV SOLN
250.0000 mL | INTRAVENOUS | Status: DC | PRN
  Administered 2016-11-26: 250 mL via INTRAVENOUS

## 2016-11-26 MED ORDER — MIDAZOLAM HCL 2 MG/2ML IJ SOLN
INTRAMUSCULAR | Status: AC
  Filled 2016-11-26: qty 2

## 2016-11-26 MED ORDER — HEPARIN SODIUM (PORCINE) 5000 UNIT/ML IJ SOLN
4000.0000 [IU] | Freq: Once | INTRAMUSCULAR | Status: DC
  Administered 2016-11-26: 4000 [IU] via INTRAVENOUS
  Filled 2016-11-26: qty 1

## 2016-11-26 MED ORDER — IPRATROPIUM-ALBUTEROL 0.5-2.5 (3) MG/3ML IN SOLN: 3.0000 mL | Freq: Four times a day (QID) | RESPIRATORY_TRACT | Status: DC | PRN

## 2016-11-26 MED ORDER — IOPAMIDOL (ISOVUE-300) INJECTION 61%
INTRAVENOUS | Status: DC | PRN
  Administered 2016-11-26: 90 mL via INTRA_ARTERIAL

## 2016-11-26 MED ORDER — SODIUM CHLORIDE 0.9% FLUSH: 3.0000 mL | Freq: Two times a day (BID) | INTRAVENOUS | Status: DC

## 2016-11-26 MED ORDER — ONDANSETRON 4 MG PO TBDP
4.0000 mg | ORAL_TABLET | Freq: Three times a day (TID) | ORAL | Status: DC | PRN
  Filled 2016-11-26: qty 1

## 2016-11-26 MED ORDER — ADULT MULTIVITAMIN W/MINERALS CH
1.0000 | ORAL_TABLET | Freq: Every day | ORAL | Status: DC
  Administered 2016-11-26 – 2016-11-27 (×2): 1 via ORAL
  Filled 2016-11-26 (×2): qty 1

## 2016-11-26 MED ORDER — HEPARIN BOLUS VIA INFUSION
4000.0000 [IU] | Freq: Once | INTRAVENOUS | Status: DC
  Filled 2016-11-26: qty 4000

## 2016-11-26 MED ORDER — HEPARIN (PORCINE) IN NACL 100-0.45 UNIT/ML-% IJ SOLN: 14.0000 [IU]/kg/h | Freq: Once | INTRAMUSCULAR | Status: DC

## 2016-11-26 MED ORDER — NITROGLYCERIN IN D5W 200-5 MCG/ML-% IV SOLN
0.0000 ug/min | INTRAVENOUS | Status: DC
  Administered 2016-11-26: 5 ug/min via INTRAVENOUS

## 2016-11-26 MED ORDER — ISOSORBIDE MONONITRATE ER 60 MG PO TB24
60.0000 mg | ORAL_TABLET | Freq: Every day | ORAL | Status: DC
  Administered 2016-11-26 – 2016-11-27 (×2): 60 mg via ORAL
  Filled 2016-11-26 (×2): qty 1

## 2016-11-26 MED ORDER — MIDAZOLAM HCL 2 MG/2ML IJ SOLN
INTRAMUSCULAR | Status: DC | PRN
  Administered 2016-11-26: 1 mg via INTRAVENOUS

## 2016-11-26 MED ORDER — ALPRAZOLAM 0.25 MG PO TABS: 0.2500 mg | ORAL_TABLET | Freq: Two times a day (BID) | ORAL | Status: DC | PRN

## 2016-11-26 MED ORDER — ACETAMINOPHEN 325 MG PO TABS: 650.0000 mg | ORAL_TABLET | ORAL | Status: DC | PRN

## 2016-11-26 MED ORDER — LIDOCAINE HCL (PF) 1 % IJ SOLN
INTRAMUSCULAR | Status: DC | PRN
  Administered 2016-11-26: 18 mL via SUBCUTANEOUS

## 2016-11-26 MED ORDER — SODIUM CHLORIDE 0.9 % WEIGHT BASED INFUSION: 1.0000 mL/kg/h | INTRAVENOUS | Status: AC

## 2016-11-26 MED ORDER — ACETAMINOPHEN 325 MG PO TABS
650.0000 mg | ORAL_TABLET | ORAL | Status: DC | PRN
  Filled 2016-11-26: qty 2

## 2016-11-26 MED ORDER — ASPIRIN 81 MG PO CHEW: 81.0000 mg | CHEWABLE_TABLET | ORAL | Status: DC

## 2016-11-26 MED ORDER — AMLODIPINE BESYLATE 5 MG PO TABS
5.0000 mg | ORAL_TABLET | Freq: Every day | ORAL | Status: DC
  Filled 2016-11-26: qty 1

## 2016-11-26 MED ORDER — ASPIRIN EC 81 MG PO TBEC: 81.0000 mg | DELAYED_RELEASE_TABLET | Freq: Every day | ORAL | Status: DC

## 2016-11-26 MED ORDER — FENTANYL CITRATE (PF) 100 MCG/2ML IJ SOLN
INTRAMUSCULAR | Status: AC
  Filled 2016-11-26: qty 2

## 2016-11-26 MED ORDER — MOMETASONE FURO-FORMOTEROL FUM 200-5 MCG/ACT IN AERO
2.0000 | INHALATION_SPRAY | Freq: Two times a day (BID) | RESPIRATORY_TRACT | Status: DC
  Administered 2016-11-26: 2 via RESPIRATORY_TRACT
  Filled 2016-11-26: qty 8.8

## 2016-11-26 MED ORDER — NITROGLYCERIN IN D5W 200-5 MCG/ML-% IV SOLN
0.0000 ug/min | Freq: Once | INTRAVENOUS | Status: AC
  Administered 2016-11-26: 5 ug/min via INTRAVENOUS
  Filled 2016-11-26: qty 250

## 2016-11-26 MED ORDER — SODIUM CHLORIDE 0.9% FLUSH
3.0000 mL | Freq: Two times a day (BID) | INTRAVENOUS | Status: DC
  Administered 2016-11-26 – 2016-11-27 (×2): 3 mL via INTRAVENOUS

## 2016-11-26 MED ORDER — HEPARIN (PORCINE) IN NACL 2-0.9 UNIT/ML-% IJ SOLN
INTRAMUSCULAR | Status: AC
  Filled 2016-11-26: qty 500

## 2016-11-26 MED ORDER — FENTANYL CITRATE (PF) 100 MCG/2ML IJ SOLN
INTRAMUSCULAR | Status: DC | PRN
  Administered 2016-11-26: 50 ug via INTRAVENOUS

## 2016-11-26 MED ORDER — ATORVASTATIN CALCIUM 20 MG PO TABS
40.0000 mg | ORAL_TABLET | Freq: Every day | ORAL | Status: DC
  Administered 2016-11-26: 40 mg via ORAL
  Filled 2016-11-26 (×2): qty 2

## 2016-11-26 MED ORDER — TRAZODONE HCL 50 MG PO TABS
25.0000 mg | ORAL_TABLET | Freq: Every evening | ORAL | Status: DC | PRN
  Administered 2016-11-26: 50 mg via ORAL
  Filled 2016-11-26: qty 1

## 2016-11-26 MED ORDER — ONDANSETRON HCL 4 MG/2ML IJ SOLN: 4.0000 mg | Freq: Four times a day (QID) | INTRAMUSCULAR | Status: DC | PRN

## 2016-11-26 MED ORDER — FUROSEMIDE 40 MG PO TABS
40.0000 mg | ORAL_TABLET | Freq: Every day | ORAL | Status: DC
  Administered 2016-11-26 – 2016-11-27 (×2): 40 mg via ORAL
  Filled 2016-11-26 (×2): qty 1

## 2016-11-26 MED ORDER — TIOTROPIUM BROMIDE MONOHYDRATE 18 MCG IN CAPS
18.0000 ug | ORAL_CAPSULE | Freq: Every day | RESPIRATORY_TRACT | Status: DC
  Filled 2016-11-26: qty 5

## 2016-11-26 SURGICAL SUPPLY — 9 items
CATH INFINITI 5FR ANG PIGTAIL (CATHETERS) ×3 IMPLANT
CATH INFINITI 5FR JL4 (CATHETERS) ×3 IMPLANT
CATH INFINITI JR4 5F (CATHETERS) ×3 IMPLANT
DEVICE CLOSURE MYNXGRIP 5F (Vascular Products) ×3 IMPLANT
KIT MANI 3VAL PERCEP (MISCELLANEOUS) ×3 IMPLANT
NEEDLE PERC 18GX7CM (NEEDLE) ×3 IMPLANT
PACK CARDIAC CATH (CUSTOM PROCEDURE TRAY) ×3 IMPLANT
SHEATH AVANTI 5FR X 11CM (SHEATH) ×3 IMPLANT
WIRE EMERALD 3MM-J .035X150CM (WIRE) ×3 IMPLANT

## 2016-11-26 NOTE — Progress Notes (Signed)
Per CCMD. Patient had 5 beats of wide QRS. Patient sleeping, asymptomatic. Now SR 70. Dr. Margaretmary Eddy paged.

## 2016-11-26 NOTE — Progress Notes (Signed)
Patient back from cath - right femoral level 0. No complaints. Able to sit up at 1400 per [facility].

## 2016-11-26 NOTE — Progress Notes (Signed)
Dr. Margaretmary Eddy rounding now. Aware of BP trends, BP now 140s/50s. Ok to give scheduled amlodipine and lasix this morning.

## 2016-11-26 NOTE — Progress Notes (Signed)
ANTICOAGULATION CONSULT NOTE - Initial Consult  Pharmacy Consult for heparin dosing Indication: chest pain/ACS  Allergies  Allergen Reactions  . Amlodipine Swelling  . Codeine Nausea And Vomiting  . Hctz [Hydrochlorothiazide] Other (See Comments)    Hypokalemia  . Lisinopril Other (See Comments)    Dizziness   . Metformin And Related Nausea Only    Nausea, dizziness    Patient Measurements: Height: 5\' 4"  (162.6 cm) Weight: 193 lb (87.5 kg) IBW/kg (Calculated) : 54.7 Heparin Dosing Weight: 74 kg  Vital Signs: Temp: 98.2 F (36.8 C) (03/29 0100) Temp Source: Oral (03/29 0100) BP: 127/50 (03/29 0100) Pulse Rate: 78 (03/29 0100)  Labs:  Recent Labs  11/26/16 0102  HGB 15.1  HCT 45.4  PLT 147*    CrCl cannot be calculated (Patient's most recent lab result is older than the maximum 21 days allowed.).   Medical History: Past Medical History:  Diagnosis Date  . Cancer ([place])    uterine  . CHF (congestive heart failure) ([place])   . COPD (chronic obstructive pulmonary disease) ([place])   . Diabetes mellitus without complication ([place])   . Pneumonia    November 2016    Medications:  Scheduled:  . heparin  4,000 Units Intravenous Once  . nitroGLYCERIN  0-200 mcg/min Intravenous Once    Assessment: Patient admitted w/ central CP radiated to sides and back. Is being placed on a heparin drip -- no OAC per med rec  Goal of Therapy:  Heparin level 0.3-0.7 units/ml Monitor platelets by anticoagulation protocol: Yes   Plan:  Baseline labs ordered. Give 4000 units bolus x 1 per HDW Will start heparin drip @ 900 units/hour and will check HL @ 0800  Tobie Lords, PharmD, BCPS Clinical Pharmacist 11/26/2016

## 2016-11-26 NOTE — Progress Notes (Signed)
Inpatient Diabetes Program Recommendations  AACE/ADA: New Consensus Statement on Inpatient Glycemic Control (2015)  Target Ranges:  Prepandial:   less than 140 mg/dL      Peak postprandial:   less than 180 mg/dL (1-2 hours)      Critically ill patients:  140 - 180 mg/dL   Results for BLYSS, LUGAR (MRN [id number]) as of 11/26/2016 09:43  Ref. Range 11/26/2016 01:02 11/26/2016 08:10  Glucose Latest Ref Range: 65 - 99 mg/dL 230 (H) 163 (H)   Review of Glycemic Control  Diabetes history: DM2 Outpatient Diabetes medications: Invokana 681 mg QAM, Trulicity 2.75 mg once a week Current orders for Inpatient glycemic control: None  Inpatient Diabetes Program Recommendations: Correction (SSI): While inpatient, please consider ordering CBGs with Novolog correction Q4H. HgbA1C: A1C in process.  Thanks, Barnie Alderman, RN, MSN, CDE Diabetes Coordinator Inpatient Diabetes Program [phone] (Team Pager from 8am to 5pm)

## 2016-11-26 NOTE — Progress Notes (Signed)
*  PRELIMINARY RESULTS* Echocardiogram 2D Echocardiogram has been performed.  Sarah White 11/26/2016, 10:19 AM

## 2016-11-26 NOTE — Progress Notes (Signed)
[place] for heparin dosing Indication: chest pain/ACS  Allergies  Allergen Reactions  . Amlodipine Swelling  . Codeine Nausea And Vomiting  . Hctz [Hydrochlorothiazide] Other (See Comments)    Hypokalemia  . Lisinopril Other (See Comments)    Dizziness   . Metformin And Related Nausea Only    Nausea, dizziness    Patient Measurements: Height: 5\' 4"  (162.6 cm) Weight: 194 lb 6.4 oz (88.2 kg) IBW/kg (Calculated) : 54.7 Heparin Dosing Weight: 74 kg  Vital Signs: Temp: 97.9 F (36.6 C) (03/29 0736) Temp Source: Oral (03/29 0736) BP: 101/38 (03/29 0736) Pulse Rate: 76 (03/29 0736)  Labs:  Recent Labs  11/26/16 0102 11/26/16 0146 11/26/16 0429 11/26/16 0810  HGB 15.1  --   --  14.4  HCT 45.4  --   --  43.7  PLT 147*  --   --  148*  APTT  --  46*  --   --   LABPROT  --  13.8  --  13.8  INR  --  1.06  --  1.06  HEPARINUNFRC  --  0.31  --  0.31  CREATININE 0.80  --   --  0.75  TROPONINI <0.03  --  <0.03  --     Estimated Creatinine Clearance: 72.4 mL/min (by C-G formula based on SCr of 0.75 mg/dL).   Medical History: Past Medical History:  Diagnosis Date  . Cancer ([place])    uterine  . CHF (congestive heart failure) ([place])   . COPD (chronic obstructive pulmonary disease) ([place])   . Diabetes mellitus without complication ([place])   . Pneumonia    November 2016    Medications:  Scheduled:  . amLODipine  5 mg Oral Daily  . [START ON 11/27/2016] aspirin  81 mg Oral Pre-Cath  . aspirin EC  81 mg Oral Daily  . atorvastatin  40 mg Oral Daily  . carvedilol  12.5 mg Oral BID WC  . furosemide  40 mg Oral Daily  . mometasone-formoterol  2 puff Inhalation BID  . multivitamin with minerals  1 tablet Oral Daily  . potassium chloride  10 mEq Oral Daily  . sodium chloride flush  3 mL Intravenous Q12H  . sodium chloride flush  3 mL Intravenous Q12H  . tiotropium  18 mcg Inhalation Daily    Assessment: Patient admitted w/ central  CP radiated to sides and back. Is being placed on a heparin drip -- no OAC per med rec  Goal of Therapy:  Heparin level 0.3-0.7 units/ml Monitor platelets by anticoagulation protocol: Yes   Plan:  Baseline labs ordered. Give 4000 units bolus x 1 per HDW Will start heparin drip @ 900 units/hour and will check HL @ 0800  3/29 HL @0810 = 0.31. Will continue current rate and check confirmatory level in 6 hours. Possibly to Cath Lab today.  Chinita Greenland PharmD Clinical Pharmacist 11/26/2016

## 2016-11-26 NOTE — ED Triage Notes (Signed)
Pt to ED from home by EMS with Chest pain. EMS reports pt woke to central chest pain that radiated to her sides and back, pt took 4 Aspirin before EMS arrival, no relief. In route EMS administered 1 Nitro spray and 57ml of Zofran with relief of nausea and chest pain. Pt O2 at 89% RA on arrival to ED, pt seems SOB, HX of COPD, MI in 2003 and current everyday cigarette smoker.

## 2016-11-26 NOTE — Consult Note (Signed)
[facility] Card[facility]  CARDIOLOGY CONSULT NOTE  [name] MRN: [id number] DOB/AGE: October 26, 1945 71 y.o.  Admit date: 11/26/2016 Referring Physician [place] Primary Physician Park Liter, Do Primary Cardiologist [facility] Reason for Consultation Unstable angina  HPI: 71 year old female referred for unstable angina. Patient has a history of CAD, status post MI and 2 stents in 2003, performed at [facility] by Dr. Saralyn Pilar, chronic systolic CHF with most recent echocardiogram in 2016 revealing LVEF 45-50%, hypertension, type 2 diabetes, hyperlipidemia, COPD, and tobacco abuse. Patient states she was in her usual state of health until last night while she was watching TV she developed 8/10 substernal chest squeezing with radiation to her left subscapular region with nausea, vomiting, shortness of breath, and diaphoresis. She took 4 baby aspirin and called EMS. The pain did not resolve until after she received nitro spray. This pain was unlike pain she has experienced in the past. Patient underwent ETT Myoview in 08/2015, which was negative for ischemia. Admission labs notable for negative serial troponin. Initial ECG showed normal sinus rhythm with T wave inversions in anterior and lateral leads. Repeat ECG was normal sinus rhythm with LBBB. Currently, the patient states she feels much better, denying chest pain.    Review of systems complete and found to be negative unless listed above     Past Medical History:  Diagnosis Date  . Cancer ([place])    uterine  . CHF (congestive heart failure) ([place])   . COPD (chronic obstructive pulmonary disease) ([place])   . Diabetes mellitus without complication ([place])   . Pneumonia    November 2016    Past Surgical History:  Procedure Laterality Date  . ABDOMINAL HYSTERECTOMY    . CARPAL TUNNEL RELEASE Bilateral   . CESAREAN SECTION    . CORONARY ANGIOPLASTY WITH STENT PLACEMENT    . KNEE SURGERY Left     Prescriptions Prior to Admission  Medication  Sig Dispense Refill Last Dose  . acetaminophen (TYLENOL) 500 MG tablet Take 500 mg by mouth every 6 (six) hours as needed for mild pain, fever or headache.    prn at prn  . albuterol (PROVENTIL) (2.5 MG/3ML) 0.083% nebulizer solution Take 3 mLs (2.5 mg total) by nebulization every 6 (six) hours as needed for wheezing or shortness of breath. 150 mL 1 prn at prn  . amLODipine (NORVASC) 5 MG tablet Take 5 mg by mouth daily.   11/25/2016 at Unknown time  . aspirin EC 81 MG tablet Take 81 mg by mouth daily.   11/25/2016 at Unknown time  . atorvastatin (LIPITOR) 40 MG tablet Take 1 tablet (40 mg total) by mouth daily. 90 tablet 1 11/25/2016 at Unknown time  . budesonide-formoterol (SYMBICORT) 160-4.5 MCG/ACT inhaler Inhale 2 puffs into the lungs 2 (two) times daily. 3 Inhaler 4 11/25/2016 at Unknown time  . canagliflozin (INVOKANA) 100 MG TABS tablet Take 1 tablet (100 mg total) by mouth daily before breakfast. 90 tablet 1 11/25/2016 at Unknown time  . carvedilol (COREG) 12.5 MG tablet Take 2 tablets (25 mg total) by mouth 2 (two) times daily with a meal. (Patient taking differently: Take 12.5 mg by mouth 2 (two) times daily with a meal. ) 360 tablet 1 11/25/2016 at 2200  . docusate sodium (COLACE) 100 MG capsule Take 100 mg by mouth daily.    11/25/2016 at Unknown time  . doxylamine, Sleep, (UNISOM) 25 MG tablet Take 12.5 mg by mouth at bedtime as needed for sleep.    prn at prn  .  Dulaglutide (TRULICITY) 2.72 ZD/6.6YQ SOPN Inject 0.75 mg into the skin once a week. 0.5 mL 6 Past Week at Unknown time  . furosemide (LASIX) 40 MG tablet Take 1 tablet (40 mg total) by mouth daily. 90 tablet 1 11/25/2016 at Unknown time  . meclizine (ANTIVERT) 12.5 MG tablet Take 1 tablet (12.5 mg total) by mouth 3 (three) times daily as needed for dizziness. 30 tablet 0 prn at prn  . Multiple Vitamin (MULTIVITAMIN WITH MINERALS) TABS tablet Take 1 tablet by mouth daily.   11/25/2016 at Unknown time  . ondansetron (ZOFRAN-ODT) 4 MG  disintegrating tablet Take 4 mg by mouth every 8 (eight) hours as needed for nausea or vomiting.   prn at prn  . potassium chloride (K-DUR,KLOR-CON) 10 MEQ tablet Take 10 mEq by mouth daily.   11/25/2016 at Unknown time  . tiotropium (SPIRIVA HANDIHALER) 18 MCG inhalation capsule Place 1 capsule (18 mcg total) into inhaler and inhale daily. 90 capsule 1 11/25/2016 at Unknown time  . traZODone (DESYREL) 50 MG tablet Take 25-50 mg by mouth at bedtime as needed for sleep.   prn at prn  . VENTOLIN HFA 108 (90 Base) MCG/ACT inhaler Inhale 1-2 puffs into the lungs every 4 (four) hours as needed for wheezing or shortness of breath. 18 g 3 prn at prn   Social History   Social History  . Marital status: Divorced    Spouse name: N/A  . Number of children: N/A  . Years of education: N/A   Occupational History  . Not on file.   Social History Main Topics  . Smoking status: Current Every Day Smoker    Packs/day: 0.50    Types: Cigarettes    Last attempt to quit: 07/17/2015  . Smokeless tobacco: Never Used  . Alcohol use No  . Drug use: No  . Sexual activity: No   Other Topics Concern  . Not on file   Social History Narrative  . No narrative on file    Family History  Problem Relation Age of Onset  . Heart disease Mother       Review of systems complete and found to be negative unless listed above      PHYSICAL EXAM  General: Well developed, well nourished, in no acute distress HEENT:  Normocephalic and atramatic Neck:  No JVD.  Lungs: Clear bilaterally to auscultation and percussion. Heart: HRRR . Normal S1 and S2 without gallops or murmurs.  Abdomen: Bowel sounds are positive, abdomen soft and non-tender  Msk:  Patient lying supine in bed. Extremities: No clubbing, cyanosis or edema.   Neuro: Alert and oriented X 3. Psych:  Good affect, responds appropriately  Labs:   Lab Results  Component Value Date   WBC 11.2 (H) 11/26/2016   HGB 15.1 11/26/2016   HCT 45.4  11/26/2016   MCV 81.3 11/26/2016   PLT 147 (L) 11/26/2016    Recent Labs Lab 11/26/16 0102  NA 140  K 4.1  CL 102  CO2 29  BUN 13  CREATININE 0.80  CALCIUM 8.8*  GLUCOSE 230*   Lab Results  Component Value Date   CKTOTAL 36 01/15/2014   CKMB 0.7 01/16/2014   TROPONINI <0.03 11/26/2016    Lab Results  Component Value Date   CHOL 164 02/04/2016   CHOL 187 08/12/2015   CHOL 258 (H) 01/16/2014   Lab Results  Component Value Date   HDL 40 08/12/2015   HDL 35 (L) 01/16/2014   Lab Results  Component Value Date   LDLCALC 90 08/12/2015   LDLCALC 156 (H) 01/16/2014   Lab Results  Component Value Date   TRIG 258 (H) 02/04/2016   TRIG 285 (H) 08/12/2015   TRIG 337 (H) 01/16/2014   No results found for: CHOLHDL No results found for: LDLDIRECT    Radiology: Dg Chest Port 1 View  Result Date: 11/26/2016 CLINICAL DATA:  Awoke with central chest pain. EXAM: PORTABLE CHEST 1 VIEW COMPARISON:  03/11/2016 FINDINGS: Imaging obtained in anti lordotic positioning, patient's chin obscures the apices. Unchanged heart size and mediastinal contours. There is mild bronchial thickening. Minimal streaky infrahilar atelectasis. No confluent airspace disease. No evidence of pneumothorax, pleural effusion or pulmonary edema. No acute osseous abnormalities are seen. IMPRESSION: Bronchial thickening with streaky bibasilar atelectasis. Electronically Signed   By: Jeb Levering M.D.   On: 11/26/2016 01:36    EKG: Sinus rhythm, 82 bpm  ASSESSMENT AND PLAN:  1. Unstable angina with history of CAD, status post MI and 2 stents in 2003, with negative serial troponin and dynamic ECG changes without ST elevation.  2. Chronic systolic CHF with most recent echo in 2016 revealing LVEF 45-50%, appears clinically stable   Recommendations: 1. Agree with current therapy. 2. Review echocardiogram 3. Discussed the risks and benefits of cardiac catheterization, and patient consent form prepared to be  signed. Patient agreed with proceeding with cardiac catheterization today with possible PCI.    Signed: Clabe Seal, PA-C 11/26/2016, 8:14 AM

## 2016-11-26 NOTE — Progress Notes (Signed)
[place] [facility] [place] NAME: Sarah White    MR#:  [id number]  DATE OF BIRTH:  1945-10-14  SUBJECTIVE:  CHIEF COMPLAINT:  Patient denies any chest pain , feeling better Had cardiac catheterization today  REVIEW OF SYSTEMS:  CONSTITUTIONAL: No fever, fatigue or weakness.  EYES: No blurred or double vision.  EARS, NOSE, AND THROAT: No tinnitus or ear pain.  RESPIRATORY: No cough, shortness of breath, wheezing or hemoptysis.  CARDIOVASCULAR: No chest pain, orthopnea, edema.  GASTROINTESTINAL: No nausea, vomiting, diarrhea or abdominal pain.  GENITOURINARY: No dysuria, hematuria.  ENDOCRINE: No polyuria, nocturia,  HEMATOLOGY: No anemia, easy bruising or bleeding SKIN: No rash or lesion. MUSCULOSKELETAL: Right groin area with no bleeding No joint pain or arthritis.   NEUROLOGIC: No tingling, numbness, weakness.  PSYCHIATRY: No anxiety or depression.   DRUG ALLERGIES:   Allergies  Allergen Reactions  . Amlodipine Swelling  . Codeine Nausea And Vomiting  . Hctz [Hydrochlorothiazide] Other (See Comments)    Hypokalemia  . Lisinopril Other (See Comments)    Dizziness   . Metformin And Related Nausea Only    Nausea, dizziness    VITALS:  Blood pressure (!) 144/52, pulse 67, temperature 97.7 F (36.5 C), temperature source Oral, resp. rate 17, height 5\' 4"  (1.626 m), weight 88 kg (194 lb), SpO2 93 %.  PHYSICAL EXAMINATION:  GENERAL:  71 y.o.-year-old patient lying in the bed with no acute distress.  EYES: Pupils equal, round, reactive to light and accommodation. No scleral icterus. Extraocular muscles intact.  HEENT: Head atraumatic, normocephalic. Oropharynx and nasopharynx clear.  NECK:  Supple, no jugular venous distention. No thyroid enlargement, no tenderness.  LUNGS: Normal breath sounds bilaterally, no wheezing, rales,rhonchi or crepitation. No use of accessory muscles of respiration.  CARDIOVASCULAR: S1, S2 normal. No  murmurs, rubs, or gallops.  ABDOMEN: Soft, nontender, nondistended. Bowel sounds present. No organomegaly or mass.  EXTREMITIES: Right groin area with clean dressing No pedal edema, cyanosis, or clubbing.  NEUROLOGIC: Cranial nerves II through XII are intact. Muscle strength 5/5 in all extremities. Sensation intact. Gait not checked.  PSYCHIATRIC: The patient is alert and oriented x 3.  SKIN: No obvious rash, lesion, or ulcer.    LABORATORY PANEL:   CBC  Recent Labs Lab 11/26/16 0810  WBC 7.2  HGB 14.4  HCT 43.7  PLT 148*   ------------------------------------------------------------------------------------------------------------------  Chemistries   Recent Labs Lab 11/26/16 0810  NA 138  K 4.0  CL 102  CO2 29  GLUCOSE 163*  BUN 13  CREATININE 0.75  CALCIUM 8.9  MG 2.1   ------------------------------------------------------------------------------------------------------------------  Cardiac Enzymes  Recent Labs Lab 11/26/16 0810  TROPONINI <0.03   ------------------------------------------------------------------------------------------------------------------  RADIOLOGY:  Dg Chest Port 1 View  Result Date: 11/26/2016 CLINICAL DATA:  Awoke with central chest pain. EXAM: PORTABLE CHEST 1 VIEW COMPARISON:  03/11/2016 FINDINGS: Imaging obtained in anti lordotic positioning, patient's chin obscures the apices. Unchanged heart size and mediastinal contours. There is mild bronchial thickening. Minimal streaky infrahilar atelectasis. No confluent airspace disease. No evidence of pneumothorax, pleural effusion or pulmonary edema. No acute osseous abnormalities are seen. IMPRESSION: Bronchial thickening with streaky bibasilar atelectasis. Electronically Signed   By: Jeb Levering M.D.   On: 11/26/2016 01:36    EKG:   Orders placed or performed during the hospital encounter of 11/26/16  . EKG 12-Lead  . EKG 12-Lead  . EKG 12-Lead  . EKG 12-Lead  . ED EKG  within  10 minutes  . ED EKG within 10 minutes  . EKG 12-lead  . EKG 12-Lead  . EKG 12-Lead    ASSESSMENT AND PLAN:    This is a 71 y.o. female with a history of  Hypertension, hyperlipidemia DM, COPD Not on home O2, CHF,  GERD CAD status post MI with 2 stents now being admitted with:  #. Unstable angina h/o CAD s/p MI and stents Chest pain resolved -Cardiac catheterization done today Two-vessel coronary artery disease with occluded proximal RCA, an occluded mid left circumflex, with contralateral collaterals to distal RCA. Normal left ventricular function -Recommending medical management and risk factor modification -Patient is started on Imdur -- Heparin and nitro drips discontinued -- Morphine, nitro, beta blocker, aspirin and statin ordered.   - Monitor patient closely today in follow-up with cardiology -TSH normal  #. History of hypertension -Continue Norvasc, Coreg twice a day  #. H/o Diabetes - Accuchecks q4h with RISS coverage - Hold oral hypoglycemics - #. History of  hyperlipidemia - Cont Lipitor  #. History of  of congestive heart failure - Cont Coreg, Lasix, potassium  #. History of  of COPD - ContSpiriva, Symbicort -O2 and Med-Neb therapy as needed    All the records are reviewed and case discussed with Care Management/Social Workerr. Management plans discussed with the patient, family and they are in agreement.  CODE STATUS: dnr   TOTAL TIME TAKING CARE OF THIS PATIENT: 36  minutes.   POSSIBLE D/C IN 1-2DAYS, DEPENDING ON CLINICAL CONDITION.  Note: This dictation was prepared with Dragon dictation along with smaller phrase technology. Any transcriptional errors that result from this process are unintentional.   Nicholes Mango M.D on 11/26/2016 at 3:25 PM  Between 7am to 6pm - Pager - [phone] After 6pm go to [email or web address] - password [email or web address] [facility]  Office  [phone]  CC: Primary care physician; Park Liter,  DO

## 2016-11-26 NOTE — ED Provider Notes (Signed)
[facility] Emergency Department Provider Note   ____________________________________________   First MD Initiated Contact with Patient 11/26/16 0111     (approximate)  I have reviewed the triage vital signs and the nursing notes.   HISTORY  Chief Complaint Chest Pain    HPI Sarah White is a 71 y.o. female brought to the ED from home via EMS with a chief complaint of chest pain. Patient has a history of CAD status post MI with 2 stents. Also with a history of COPD, not on oxygen, hyperlipidemia, hypertension. Reports central chest pressure radiating to her left shoulder blade approximately midnight. Symptoms associated with diaphoresis, shortness of breath, nausea and vomiting. Patient took 4 baby aspirin without relief prior to EMS arrival.Received one nitroglycerin spray and 4 mg IV Zofran en route. Pain currently decreased from 8/10-3/10. Patient denies recent fever, chills, abdominal pain, diarrhea. Denies recent travel or trauma.   Past Medical History:  Diagnosis Date  . Cancer ([place])    uterine  . CHF (congestive heart failure) ([place])   . COPD (chronic obstructive pulmonary disease) ([place])   . Diabetes mellitus without complication ([place])   . Pneumonia    November 2016    Patient Active Problem List   Diagnosis Date Noted  . Unstable angina ([place]) 11/26/2016  . Near syncope 03/11/2016  . Hypokalemia 03/11/2016  . Orthostatic hypotension 08/12/2015  . Hyperlipidemia 07/29/2015  . Acute systolic CHF (congestive heart failure) ([place]) 07/25/2015  . Hypertensive renal disease 07/25/2015  . COPD exacerbation ([place]) 07/25/2015  . Low back pain 07/25/2015  . Acute respiratory failure with hypoxia ([place]) 07/22/2015  . Acute respiratory failure with hypoxia and hypercarbia ([place]) 07/22/2015  . Pulmonary edema 07/22/2015  . Smoker 07/22/2015  . Encephalopathy acute 07/22/2015  . COPD (chronic obstructive pulmonary disease) ([place]) 07/22/2015  .  Diabetes mellitus with chronic kidney disease ([place]) 07/22/2015  . CAD (coronary artery disease) 07/22/2015  . Community acquired pneumonia 07/17/2015    Past Surgical History:  Procedure Laterality Date  . ABDOMINAL HYSTERECTOMY    . CARPAL TUNNEL RELEASE Bilateral   . CESAREAN SECTION    . CORONARY ANGIOPLASTY WITH STENT PLACEMENT    . KNEE SURGERY Left     Prior to Admission medications   Medication Sig Start Date End Date Taking? Authorizing Provider  acetaminophen (TYLENOL) 500 MG tablet Take 500 mg by mouth every 6 (six) hours as needed for mild pain, fever or headache.    Yes Historical Provider, MD  albuterol (PROVENTIL) (2.5 MG/3ML) 0.083% nebulizer solution Take 3 mLs (2.5 mg total) by nebulization every 6 (six) hours as needed for wheezing or shortness of breath. 10/16/15  Yes [name] Johnson, DO  amLODipine (NORVASC) 5 MG tablet Take 5 mg by mouth daily.   Yes Historical Provider, MD  aspirin EC 81 MG tablet Take 81 mg by mouth daily.   Yes Historical Provider, MD  atorvastatin (LIPITOR) 40 MG tablet Take 1 tablet (40 mg total) by mouth daily. 06/15/16  Yes [name] Johnson, DO  budesonide-formoterol (SYMBICORT) 160-4.5 MCG/ACT inhaler Inhale 2 puffs into the lungs 2 (two) times daily. 09/24/15  Yes [name] Johnson, DO  canagliflozin (INVOKANA) 100 MG TABS tablet Take 1 tablet (100 mg total) by mouth daily before breakfast. 06/15/16  Yes [name] Johnson, DO  carvedilol (COREG) 12.5 MG tablet Take 2 tablets (25 mg total) by mouth 2 (two) times daily with a meal. Patient taking differently: Take 12.5 mg by mouth  2 (two) times daily with a meal.  08/06/16  Yes [name] Johnson, DO  docusate sodium (COLACE) 100 MG capsule Take 100 mg by mouth daily.    Yes Historical Provider, MD  doxylamine, Sleep, (UNISOM) 25 MG tablet Take 12.5 mg by mouth at bedtime as needed for sleep.    Yes Historical Provider, MD  Dulaglutide (TRULICITY) 1.19 JY/7.8GN SOPN Inject 0.75 mg into the skin once a  week. 06/15/16  Yes [name] Johnson, DO  furosemide (LASIX) 40 MG tablet Take 1 tablet (40 mg total) by mouth daily. 06/15/16  Yes [name] Johnson, DO  meclizine (ANTIVERT) 12.5 MG tablet Take 1 tablet (12.5 mg total) by mouth 3 (three) times daily as needed for dizziness. 03/17/16  Yes Volney American, PA-C  Multiple Vitamin (MULTIVITAMIN WITH MINERALS) TABS tablet Take 1 tablet by mouth daily.   Yes Historical Provider, MD  ondansetron (ZOFRAN-ODT) 4 MG disintegrating tablet Take 4 mg by mouth every 8 (eight) hours as needed for nausea or vomiting.   Yes Historical Provider, MD  potassium chloride (K-DUR,KLOR-CON) 10 MEQ tablet Take 10 mEq by mouth daily.   Yes Historical Provider, MD  tiotropium (SPIRIVA HANDIHALER) 18 MCG inhalation capsule Place 1 capsule (18 mcg total) into inhaler and inhale daily. 09/24/15  Yes [name] Johnson, DO  traZODone (DESYREL) 50 MG tablet Take 25-50 mg by mouth at bedtime as needed for sleep.   Yes Historical Provider, MD  VENTOLIN HFA 108 (90 Base) MCG/ACT inhaler Inhale 1-2 puffs into the lungs every 4 (four) hours as needed for wheezing or shortness of breath. 09/24/15  Yes [name] Johnson, DO    Allergies Amlodipine; Codeine; Hctz [hydrochlorothiazide]; Lisinopril; and Metformin and related  Family History  Problem Relation Age of Onset  . Heart disease Mother     Social History Social History  Substance Use Topics  . Smoking status: Current Every Day Smoker    Packs/day: 0.50    Types: Cigarettes    Last attempt to quit: 07/17/2015  . Smokeless tobacco: Never Used  . Alcohol use No    Review of Systems  Constitutional: No fever/chills. Eyes: No visual changes. ENT: No sore throat. Cardiovascular: Positive for chest pain. Respiratory: Positive for shortness of breath. Gastrointestinal: No abdominal pain.  Positive for nausea and vomiting.  No diarrhea.  No constipation. Genitourinary: Negative for dysuria. Musculoskeletal: Negative for  back pain. Skin: Negative for rash. Neurological: Negative for headaches, focal weakness or numbness.  10-point ROS otherwise negative.  ____________________________________________   PHYSICAL EXAM:  VITAL SIGNS: ED Triage Vitals  Enc Vitals Group     BP 11/26/16 0100 (!) 127/50     Pulse Rate 11/26/16 0100 78     Resp 11/26/16 0100 (!) 21     Temp 11/26/16 0100 98.2 F (36.8 C)     Temp Source 11/26/16 0100 Oral     SpO2 11/26/16 0100 (!) 89 %     Weight 11/26/16 0101 193 lb (87.5 kg)     Height 11/26/16 0101 5\' 4"  (1.626 m)     Head Circumference --      Peak Flow --      Pain Score --      Pain Loc --      Pain Edu? --      Excl. in [place]? --     Constitutional: Alert and oriented. Weak-appearing and in mild acute distress. Eyes: Conjunctivae are normal. PERRL. EOMI. Head: Atraumatic. Nose: No congestion/rhinnorhea. Mouth/Throat:  Mucous membranes are moist.  Oropharynx non-erythematous. Neck: No stridor.   Cardiovascular: Normal rate, regular rhythm. Grossly normal heart sounds.  Good peripheral circulation. Respiratory: Normal respiratory effort.  No retractions. Lungs slightly diminished bibasilarly but otherwise CTAB. Gastrointestinal: Soft and nontender. No distention. No abdominal bruits. No CVA tenderness. Musculoskeletal: No lower extremity tenderness nor edema.  No joint effusions. Neurologic:  Normal speech and language. No gross focal neurologic deficits are appreciated. Skin:  Skin is warm, dry and intact. No rash noted. Psychiatric: Mood and affect are normal. Speech and behavior are normal.  ____________________________________________   LABS (all labs ordered are listed, but only abnormal results are displayed)  Labs Reviewed  BASIC METABOLIC PANEL - Abnormal; Notable for the following:       Result Value   Glucose, Bld 230 (*)    Calcium 8.8 (*)    All other components within normal limits  CBC - Abnormal; Notable for the following:    WBC 11.2  (*)    RBC 5.59 (*)    RDW 15.3 (*)    Platelets 147 (*)    All other components within normal limits  APTT - Abnormal; Notable for the following:    aPTT 46 (*)    All other components within normal limits  TROPONIN I  HEPARIN LEVEL (UNFRACTIONATED)  PROTIME-INR  TROPONIN I  BRAIN NATRIURETIC PEPTIDE  HEPARIN LEVEL (UNFRACTIONATED)  MAGNESIUM  TSH  TROPONIN I  TROPONIN I  HEMOGLOBIN H7C  BASIC METABOLIC PANEL  LIPID PANEL  CBC  PROTIME-INR   ____________________________________________  EKG  ED ECG REPORT I, Nisha Dhami J, the attending physician, personally viewed and interpreted this ECG.   Date: 11/26/2016  EKG Time: 0054  Rate: 73  Rhythm: normal EKG, normal sinus rhythm  Axis: LAD  Intervals:PACs  ST&T Change: T-wave inversion anterior lateral leads  ED ECG REPORT I, Kathrene Sinopoli J, the attending physician, personally viewed and interpreted this ECG.   Date: 11/26/2016  EKG Time: 0055  Rate: 79  Rhythm: normal EKG, normal sinus rhythm  Axis: LAD  Intervals:left bundle branch block  ST&T Change: Nonspecific   ____________________________________________  RADIOLOGY  Chest x-ray interpreted per Dr. Marisue Humble: Bronchial thickening with streaky bibasilar atelectasis. ____________________________________________   PROCEDURES  Procedure(s) performed: None  Procedures  Critical Care performed: Yes, see critical care note(s)   CRITICAL CARE Performed by: Paulette Blanch   Total critical care time: 45 minutes  Critical care time was exclusive of separately billable procedures and treating other patients.  Critical care was necessary to treat or prevent imminent or life-threatening deterioration.  Critical care was time spent personally by me on the following activities: development of treatment plan with patient and/or surrogate as well as nursing, discussions with consultants, evaluation of patient's response to treatment, examination of patient,  obtaining history from patient or surrogate, ordering and performing treatments and interventions, ordering and review of laboratory studies, ordering and review of radiographic studies, pulse oximetry and re-evaluation of patient's condition.  ____________________________________________   INITIAL IMPRESSION / ASSESSMENT AND PLAN / ED COURSE  Pertinent labs & imaging results that were available during my care of the patient were reviewed by me and considered in my medical decision making (see chart for details).  71 year old female with a history of CAD status post stents who presents with chest pain. While her EKGs do not meet strict STEMI criteria for urgent catheterization, she does have dynamic EKG changes which are concerning for evolving non-STEMI. Will discuss with cardiology.  Clinical  Course as of Nov 27 722  Thu Nov 26, 2016  0125 Discuss with cardiology Dr. Clayborn Bigness who will reviewed EKGs.  [JS]  [id number] Discussed again with Dr. Clayborn Bigness who agrees patient does not currently meet STEMI criteria for urgent catheterization. Recommends anticoagulation. Discussed with hospitalist to evaluate patient in the emergency department for admission.  [JS]    Clinical Course User Index [JS] Paulette Blanch, MD     ____________________________________________   FINAL CLINICAL IMPRESSION(S) / ED DIAGNOSES  Final diagnoses:  Chest pain, unspecified type  Unstable angina ([place])      NEW MEDICATIONS STARTED DURING THIS VISIT:  Current Discharge Medication List       Note:  This document was prepared using Dragon voice recognition software and may include unintentional dictation errors.    Paulette Blanch, MD 11/26/16 [phone]

## 2016-11-26 NOTE — Progress Notes (Signed)
BP soft all night, no complaints of chest pain. Ok to turn off nitro gtt per Vicente Males PA for Dr. Saralyn Pilar. Will continue to monitor. Also notified her of strip saved from CCMD regarding ventricular rhythm. 0800 EKG due now, in process.

## 2016-11-26 NOTE — H&P (Signed)
History and Physical   [facility] - [place] [facility] Admission History and Physical [employer], D.O.    Patient Name: Sarah White MR#: [id number] Date of Birth: Jan 14, 1946 Date of Admission: 11/26/2016  Referring MD/NP/PA: Dr. Beather Arbour Primary Care Physician: Park Liter, DO Patient coming from: Home Outpatient Specialists: Dr. Clayborn Bigness   Chief Complaint:  Chief Complaint  Patient presents with  . Chest Pain    HPI: Sarah White is a 71 y.o. female with a known history of Hypertension, hyperlipidemia DM, COPD Not on home O2, CHF,  GERD CAD status post MI with 2 stents presents to the emergency department for evaluation of chest pain.  Patient was in a usual state of health until this evening when she was at rest she developed central substernal chest pain described as 8/10, radiating to her back and left shoulder associated with diaphoresis, nausea and vomiting 1, anxiety. Patient states that she has had chest pain in the past but never this severe. At home she had taken 4 baby aspirin, both doses of her Coreg and her statin. In reviewing the EMS she received 1 dose of nitroglycerin spray and 4 mg of Zofran which decreased her pain to a 3 out of 10.  Otherwise there has been no change in status. Patient has been taking medication as prescribed and there has been no recent change in medication or diet.  No recent antibiotics.  There has been no recent illness, hospitalizations, travel or sick contacts.    EMS/ED Course: Patient received heparin drip, nitroglycerin.  Review of Systems:  CONSTITUTIONAL: No fever/chills, fatigue, weakness, weight gain/loss, headache. EYES: No blurry or double vision. ENT: No tinnitus, postnasal drip, redness or soreness of the oropharynx. RESPIRATORY: No cough, positive dyspnea, negative wheeze.  No hemoptysis.  CARDIOVASCULAR: Positive chest pain, negative palpitations, syncope, orthopnea. No lower extremity edema.  GASTROINTESTINAL: No  abdominal pain, diarrhea, constipation.  No hematemesis, melena or hematochezia. Positive nausea and vomiting 1. GENITOURINARY: No dysuria, frequency, hematuria. ENDOCRINE: No polyuria or nocturia. No heat or cold intolerance. HEMATOLOGY: No anemia, bruising, bleeding. INTEGUMENTARY: No rashes, ulcers, lesions. MUSCULOSKELETAL: No arthritis, gout, dyspnea. NEUROLOGIC: No numbness, tingling, ataxia, seizure-type activity, weakness. PSYCHIATRIC: No depression, insomnia. Positive anxiety   Past Medical History:  Diagnosis Date  . Cancer ([place])    uterine  . CHF (congestive heart failure) ([place])   . COPD (chronic obstructive pulmonary disease) ([place])   . Diabetes mellitus without complication ([place])   . Pneumonia    November 2016   Hypertension, hyperlipidemia,  DM, COPD Not on home O2, CHF,  GERD CAD status post MI with 2 stents  Past Surgical History:  Procedure Laterality Date  . ABDOMINAL HYSTERECTOMY    . CARPAL TUNNEL RELEASE Bilateral   . CESAREAN SECTION    . CORONARY ANGIOPLASTY WITH STENT PLACEMENT    . KNEE SURGERY Left      reports that she has been smoking Cigarettes.  She has been smoking about 0.50 packs per day. She has never used smokeless tobacco. She reports that she does not drink alcohol or use drugs.  Allergies  Allergen Reactions  . Amlodipine Swelling  . Codeine Nausea And Vomiting  . Hctz [Hydrochlorothiazide] Other (See Comments)    Hypokalemia  . Lisinopril Other (See Comments)    Dizziness   . Metformin And Related Nausea Only    Nausea, dizziness    Family History  Problem Relation Age of Onset  . Heart disease Mother   Family History  Medical History Relation Name Comments  Colon cancer Maternal Uncle    Colon cancer Maternal Uncle Mat Uncle   Dementia Mother    Heart disease Mother    Heart failure Mother    Thyroid disease Mother       Prior to Admission medications   Medication Sig Start Date End Date Taking?  Authorizing Provider  acetaminophen (TYLENOL) 500 MG tablet Take 500 mg by mouth every 6 (six) hours as needed for mild pain, fever or headache.    Yes Historical Provider, MD  albuterol (PROVENTIL) (2.5 MG/3ML) 0.083% nebulizer solution Take 3 mLs (2.5 mg total) by nebulization every 6 (six) hours as needed for wheezing or shortness of breath. 10/16/15  Yes [name] Johnson, DO  amLODipine (NORVASC) 5 MG tablet Take 5 mg by mouth daily.   Yes Historical Provider, MD  aspirin EC 81 MG tablet Take 81 mg by mouth daily.   Yes Historical Provider, MD  atorvastatin (LIPITOR) 40 MG tablet Take 1 tablet (40 mg total) by mouth daily. 06/15/16  Yes [name] Johnson, DO  budesonide-formoterol (SYMBICORT) 160-4.5 MCG/ACT inhaler Inhale 2 puffs into the lungs 2 (two) times daily. 09/24/15  Yes [name] Johnson, DO  canagliflozin (INVOKANA) 100 MG TABS tablet Take 1 tablet (100 mg total) by mouth daily before breakfast. 06/15/16  Yes [name] Johnson, DO  carvedilol (COREG) 12.5 MG tablet Take 2 tablets (25 mg total) by mouth 2 (two) times daily with a meal. Patient taking differently: Take 12.5 mg by mouth 2 (two) times daily with a meal.  08/06/16  Yes [name] Johnson, DO  docusate sodium (COLACE) 100 MG capsule Take 100 mg by mouth daily.    Yes Historical Provider, MD  doxylamine, Sleep, (UNISOM) 25 MG tablet Take 12.5 mg by mouth at bedtime as needed for sleep.    Yes Historical Provider, MD  Dulaglutide (TRULICITY) 1.51 VO/1.6WV SOPN Inject 0.75 mg into the skin once a week. 06/15/16  Yes [name] Johnson, DO  furosemide (LASIX) 40 MG tablet Take 1 tablet (40 mg total) by mouth daily. 06/15/16  Yes [name] Johnson, DO  meclizine (ANTIVERT) 12.5 MG tablet Take 1 tablet (12.5 mg total) by mouth 3 (three) times daily as needed for dizziness. 03/17/16  Yes Volney American, PA-C  Multiple Vitamin (MULTIVITAMIN WITH MINERALS) TABS tablet Take 1 tablet by mouth daily.   Yes Historical Provider, MD  ondansetron  (ZOFRAN-ODT) 4 MG disintegrating tablet Take 4 mg by mouth every 8 (eight) hours as needed for nausea or vomiting.   Yes Historical Provider, MD  potassium chloride (K-DUR,KLOR-CON) 10 MEQ tablet Take 10 mEq by mouth daily.   Yes Historical Provider, MD  tiotropium (SPIRIVA HANDIHALER) 18 MCG inhalation capsule Place 1 capsule (18 mcg total) into inhaler and inhale daily. 09/24/15  Yes [name] Johnson, DO  traZODone (DESYREL) 50 MG tablet Take 25-50 mg by mouth at bedtime as needed for sleep.   Yes Historical Provider, MD  VENTOLIN HFA 108 (90 Base) MCG/ACT inhaler Inhale 1-2 puffs into the lungs every 4 (four) hours as needed for wheezing or shortness of breath. 09/24/15  Yes Valerie Roys, DO    Physical Exam: Vitals:   11/26/16 0100 11/26/16 0101 11/26/16 0200  BP: (!) 127/50  133/63  Pulse: 78  80  Resp: (!) 21  (!) 23  Temp: 98.2 F (36.8 C)    TempSrc: Oral    SpO2: (!) 89%  92%  Weight:  87.5 kg (193 lb)   Height:  5\' 4"  (1.626 m)     GENERAL: 71 y.o.-year-old White female patient, chronically ill-appearing, sitting up in the bed in no acute distress.  Pleasant and cooperative. Lately anxious   HEENT: Head atraumatic, normocephalic. Pupils equal, round, reactive to light and accommodation. No scleral icterus. Extraocular muscles intact. Nares are patent. Oropharynx is clear. Mucus membranes moist. NECK: Supple, full range of motion. No JVD, no bruit heard. No thyroid enlargement, no tenderness, no cervical lymphadenopathy. CHEST: Normal breath sounds bilaterally. No wheezing, rales, rhonchi or crackles. No use of accessory muscles of respiration.  No reproducible chest wall tenderness.  CARDIOVASCULAR: S1, S2 normal. No murmurs, rubs, or gallops. Cap refill <2 seconds. Pulses intact distally.  ABDOMEN: Soft, nondistended, nontender. No rebound, guarding, rigidity. Normoactive bowel sounds present in all four quadrants. No organomegaly or mass. EXTREMITIES: No pedal edema, cyanosis,  or clubbing. No calf tenderness or Homan's sign.  NEUROLOGIC: The patient is alert and oriented x 3. Cranial nerves II through XII are grossly intact with no focal sensorimotor deficit. Muscle strength 5/5 in all extremities. Sensation intact. Gait not checked. PSYCHIATRIC:  Normal affect, mood, thought content. SKIN: Warm, dry, and intact without obvious rash, lesion, or ulcer.    Labs on Admission:  CBC:  Recent Labs Lab 11/26/16 0102  WBC 11.2*  HGB 15.1  HCT 45.4  MCV 81.3  PLT 578*   Basic Metabolic Panel:  Recent Labs Lab 11/26/16 0102  NA 140  K 4.1  CL 102  CO2 29  GLUCOSE 230*  BUN 13  CREATININE 0.80  CALCIUM 8.8*   GFR: Estimated Creatinine Clearance: 72 mL/min (by C-G formula based on SCr of 0.8 mg/dL). Liver Function Tests: No results for input(s): AST, ALT, ALKPHOS, BILITOT, PROT, ALBUMIN in the last 168 hours. No results for input(s): LIPASE, AMYLASE in the last 168 hours. No results for input(s): AMMONIA in the last 168 hours. Coagulation Profile: No results for input(s): INR, PROTIME in the last 168 hours. Cardiac Enzymes:  Recent Labs Lab 11/26/16 0102  TROPONINI <0.03   BNP (last 3 results) No results for input(s): PROBNP in the last 8760 hours. HbA1C: No results for input(s): HGBA1C in the last 72 hours. CBG: No results for input(s): GLUCAP in the last 168 hours. Lipid Profile: No results for input(s): CHOL, HDL, LDLCALC, TRIG, CHOLHDL, LDLDIRECT in the last 72 hours. Thyroid Function Tests: No results for input(s): TSH, T4TOTAL, FREET4, T3FREE, THYROIDAB in the last 72 hours. Anemia Panel: No results for input(s): VITAMINB12, FOLATE, FERRITIN, TIBC, IRON, RETICCTPCT in the last 72 hours. Urine analysis:    Component Value Date/Time   COLORURINE COLORLESS (A) 03/11/2016 1411   APPEARANCEUR CLEAR (A) 03/11/2016 1411   APPEARANCEUR Hazy 01/15/2014 1706   LABSPEC 1.003 (L) 03/11/2016 1411   LABSPEC 1.028 01/15/2014 1706   PHURINE  6.0 03/11/2016 1411   GLUCOSEU >500 (A) 03/11/2016 1411   GLUCOSEU >=500 01/15/2014 1706   HGBUR NEGATIVE 03/11/2016 1411   BILIRUBINUR NEGATIVE 03/11/2016 1411   BILIRUBINUR Negative 01/15/2014 1706   KETONESUR NEGATIVE 03/11/2016 1411   PROTEINUR NEGATIVE 03/11/2016 1411   NITRITE NEGATIVE 03/11/2016 1411   LEUKOCYTESUR NEGATIVE 03/11/2016 1411   LEUKOCYTESUR Negative 01/15/2014 1706   Sepsis Labs: @LABRCNTIP (procalcitonin:4,lacticidven:4) )No results found for this or any previous visit (from the past 240 hour(s)).   Radiological Exams on Admission: Dg Chest Port 1 View  Result Date: 11/26/2016 CLINICAL DATA:  Awoke with central chest pain. EXAM:  PORTABLE CHEST 1 VIEW COMPARISON:  03/11/2016 FINDINGS: Imaging obtained in anti lordotic positioning, patient's chin obscures the apices. Unchanged heart size and mediastinal contours. There is mild bronchial thickening. Minimal streaky infrahilar atelectasis. No confluent airspace disease. No evidence of pneumothorax, pleural effusion or pulmonary edema. No acute osseous abnormalities are seen. IMPRESSION: Bronchial thickening with streaky bibasilar atelectasis. Electronically Signed   By: Jeb Levering M.D.   On: 11/26/2016 01:36    EKG: Normal sinus rhythm at 73 bpm with leftward axis, anterior and lateral T-wave inversions  nd nonspecific ST-T wave changes.  repeat EKG was sinus rhythm at 79 bpm with a leftward axis, left bundle branch block.   Assessment/Plan  This is a 71 y.o. female with a history of  Hypertension, hyperlipidemia DM, COPD Not on home O2, CHF,  GERD CAD status post MI with 2 stents now being admitted with:  #. Unstable angina h/o CAD s/p MI and stents - Admit to inpatient with telemetry monitoring. - Heparin and nitro drips - Trend troponins, check lipids and TSH. - NPO after midnight - Morphine, nitro, beta blocker, aspirin and statin ordered.   - Cardiology consultation has been requested. Dr. Clayborn Bigness  contacted by ED.  #. History of hypertension -Continue Norvasc, Coreg twice a day  #. H/o Diabetes - Accuchecks q4h with RISS coverage - Hold oral hypoglycemics - NPO  #. History of  hyperlipidemia - Cont Lipitor  #. History of  of congestive heart failure - Cont Coreg, Lasix, potassium  #. History of  of COPD - ContSpiriva, Symbicort -O2 and Med-Neb therapy as needed  Admission status: Inpatient IV Fluids: HL Diet/Nutrition: NPO Consults called: Cardio  DVT Px: Heparin, SCDs and early ambulation. Code Status: DNR  Disposition Plan: To home in 1-2 days  All the records are reviewed and case discussed with ED provider. Management plans discussed with the patient and her son who express understanding and agree with plan of care.  Ashland Osmer D.O. on 11/26/2016 at 2:42 AM Between 7am to 6pm - Pager - [phone] After 6pm go to [email or web address] - [employer] [facility] [place] [facility] Office [phone] CC: Primary care physician; Park Liter, DO   11/26/2016, 2:42 AM

## 2016-11-27 ENCOUNTER — Encounter: Payer: Self-pay | Admitting: Cardiology

## 2016-11-27 LAB — CBC
HCT: 41.2 % (ref 35.0–47.0)
HEMOGLOBIN: 13.6 g/dL (ref 12.0–16.0)
MCH: 27.3 pg (ref 26.0–34.0)
MCHC: 33 g/dL (ref 32.0–36.0)
MCV: 82.8 fL (ref 80.0–100.0)
PLATELETS: 123 10*3/uL — AB (ref 150–440)
RBC: 4.97 MIL/uL (ref 3.80–5.20)
RDW: 15.4 % — ABNORMAL HIGH (ref 11.5–14.5)
WBC: 6.4 10*3/uL (ref 3.6–11.0)

## 2016-11-27 LAB — BASIC METABOLIC PANEL
Anion gap: 7 (ref 5–15)
BUN: 12 mg/dL (ref 6–20)
CHLORIDE: 103 mmol/L (ref 101–111)
CO2: 28 mmol/L (ref 22–32)
Calcium: 8.6 mg/dL — ABNORMAL LOW (ref 8.9–10.3)
Creatinine, Ser: 0.65 mg/dL (ref 0.44–1.00)
GFR calc Af Amer: >60 mL/min (ref >60)
Glucose, Bld: 165 mg/dL — ABNORMAL HIGH (ref 65–99)
POTASSIUM: 4.1 mmol/L (ref 3.5–5.1)
SODIUM: 138 mmol/L (ref 135–145)

## 2016-11-27 LAB — HEMOGLOBIN A1C
HEMOGLOBIN A1C: 9.6 % — AB (ref 4.8–5.6)
Mean Plasma Glucose: 229 mg/dL

## 2016-11-27 MED ORDER — ISOSORBIDE MONONITRATE ER 60 MG PO TB24: 60.0000 mg | ORAL_TABLET | Freq: Every day | ORAL | 0 refills | Status: DC

## 2016-11-27 MED ORDER — NITROGLYCERIN 0.4 MG SL SUBL
0.4000 mg | SUBLINGUAL_TABLET | SUBLINGUAL | 0 refills | Status: DC | PRN
Start: 2016-11-27 — End: 2017-05-25

## 2016-11-27 NOTE — Discharge Summary (Signed)
[facility] [place] NAME: Sarah White    MR#:  [id number]  DATE OF BIRTH:  Aug 21, 1946  DATE OF ADMISSION:  11/26/2016 ADMITTING PHYSICIAN: Harvie Bridge, DO  DATE OF DISCHARGE: 11/27/16 PRIMARY CARE PHYSICIAN: Park Liter, DO    ADMISSION DIAGNOSIS:  Unstable angina ([place]) [I20.0] Chest pain [R07.9] Chest pain, unspecified type [R07.9]  DISCHARGE DIAGNOSIS:  Active Problems:   Unstable angina ([place])   SECONDARY DIAGNOSIS:   Past Medical History:  Diagnosis Date  . Cancer ([place])    uterine  . CHF (congestive heart failure) ([place])   . COPD (chronic obstructive pulmonary disease) ([place])   . Diabetes mellitus without complication ([place])   . Pneumonia    November 2016    HOSPITAL COURSE:   HPI: Calianne Larue is a 71 y.o. female with a known history of Hypertension, hyperlipidemia DM, COPD Not on home O2, CHF,  GERD CAD status post MI with 2 stents presents to the emergency department for evaluation of chest pain.  Patient was in a usual state of health until this evening when she was at rest she developed central substernal chest pain described as 8/10, radiating to her back and left shoulder associated with diaphoresis, nausea and vomiting 1, anxiety. Patient states that she has had chest pain in the past but never this severe. At home she had taken 4 baby aspirin, both doses of her Coreg and her statin. In reviewing the EMS she received 1 dose of nitroglycerin spray and 4 mg of Zofran which decreased her pain to a 3 out of 10.  Otherwise there has been no change in status. Patient has been taking medication as prescribed and there has been no recent change in medication or diet.  No recent antibiotics.  There has been no recent illness, hospitalizations, travel or sick contacts.     #.Unstable angina h/o CAD s/p MI and stents Chest pain resolved -Cardiac catheterization done today Two-vessel coronary artery disease with  occluded proximal RCA, an occluded mid left circumflex, with contralateral collaterals to distal RCA. Normal left ventricular function -Recommending medical management and risk factor modification -Patient is started on Imdur, doing fine now  -- Heparin and nitro drips discontinued -- Morphine, nitro, beta blocker, aspirin and statin ordered.  - Outpatient follow-up with cardiology -TSH normal -Sublingual nitroglycerin as needed basis  #. History of hypertension -Continue Coreg twice a day -Patient is started on Imdur, discontinue Norvasc  #. H/o Diabetes - Accuchecks q4hwith RISS coverage during the hospital course - Resume home dose oral hypoglycemics - #. History of hyperlipidemia - Cont Lipitor  #. History of of congestive heart failure - Cont Coreg, Lasix, potassium  #. History of of COPD - ContSpiriva, Symbicort -O2 and Med-Neb therapy as needed   DISCHARGE CONDITIONS:   Stable   CONSULTS OBTAINED:  Treatment Team:  Isaias Cowman, MD   PROCEDURES Cardiac catheterization  Left Heart Cath and Coronary Angiography  Conclusion     Lat 1st Mrg lesion, 100 %stenosed.  Prox Cx lesion, 75 %stenosed.  Prox Cx to Mid Cx lesion, 100 %stenosed.  Prox LAD lesion, 40 %stenosed.  Prox RCA lesion, 100 %stenosed.   1. Two-vessel coronary artery disease with occluded proximal RCA, an occluded mid left circumflex, with contralateral collaterals to distal RCA 2. Normal left ventricular function  Recommendations  1. Continue medical therapy 2. Aggressive risk factor modification      DRUG ALLERGIES:   Allergies  Allergen Reactions  .  Amlodipine Swelling  . Codeine Nausea And Vomiting  . Hctz [Hydrochlorothiazide] Other (See Comments)    Hypokalemia  . Lisinopril Other (See Comments)    Dizziness   . Metformin And Related Nausea Only    Nausea, dizziness    DISCHARGE MEDICATIONS:   Current Discharge Medication List    START  taking these medications   Details  isosorbide mononitrate (IMDUR) 60 MG 24 hr tablet Take 1 tablet (60 mg total) by mouth daily. Qty: 30 tablet, Refills: 0    nitroGLYCERIN (NITROSTAT) 0.4 MG SL tablet Place 1 tablet (0.4 mg total) under the tongue every 5 (five) minutes as needed for chest pain. Qty: 10 tablet, Refills: 0      CONTINUE these medications which have NOT CHANGED   Details  acetaminophen (TYLENOL) 500 MG tablet Take 500 mg by mouth every 6 (six) hours as needed for mild pain, fever or headache.     albuterol (PROVENTIL) (2.5 MG/3ML) 0.083% nebulizer solution Take 3 mLs (2.5 mg total) by nebulization every 6 (six) hours as needed for wheezing or shortness of breath. Qty: 150 mL, Refills: 1   Associated Diagnoses: Shortness of breath    aspirin EC 81 MG tablet Take 81 mg by mouth daily.    atorvastatin (LIPITOR) 40 MG tablet Take 1 tablet (40 mg total) by mouth daily. Qty: 90 tablet, Refills: 1    budesonide-formoterol (SYMBICORT) 160-4.5 MCG/ACT inhaler Inhale 2 puffs into the lungs 2 (two) times daily. Qty: 3 Inhaler, Refills: 4    canagliflozin (INVOKANA) 100 MG TABS tablet Take 1 tablet (100 mg total) by mouth daily before breakfast. Qty: 90 tablet, Refills: 1    carvedilol (COREG) 12.5 MG tablet Take 2 tablets (25 mg total) by mouth 2 (two) times daily with a meal. Qty: 360 tablet, Refills: 1    docusate sodium (COLACE) 100 MG capsule Take 100 mg by mouth daily.     doxylamine, Sleep, (UNISOM) 25 MG tablet Take 12.5 mg by mouth at bedtime as needed for sleep.     Dulaglutide (TRULICITY) 2.75 TZ/0.0FV SOPN Inject 0.75 mg into the skin once a week. Qty: 0.5 mL, Refills: 6    furosemide (LASIX) 40 MG tablet Take 1 tablet (40 mg total) by mouth daily. Qty: 90 tablet, Refills: 1    meclizine (ANTIVERT) 12.5 MG tablet Take 1 tablet (12.5 mg total) by mouth 3 (three) times daily as needed for dizziness. Qty: 30 tablet, Refills: 0    Multiple Vitamin  (MULTIVITAMIN WITH MINERALS) TABS tablet Take 1 tablet by mouth daily.    ondansetron (ZOFRAN-ODT) 4 MG disintegrating tablet Take 4 mg by mouth every 8 (eight) hours as needed for nausea or vomiting.    potassium chloride (K-DUR,KLOR-CON) 10 MEQ tablet Take 10 mEq by mouth daily.    tiotropium (SPIRIVA HANDIHALER) 18 MCG inhalation capsule Place 1 capsule (18 mcg total) into inhaler and inhale daily. Qty: 90 capsule, Refills: 1    traZODone (DESYREL) 50 MG tablet Take 25-50 mg by mouth at bedtime as needed for sleep.    VENTOLIN HFA 108 (90 Base) MCG/ACT inhaler Inhale 1-2 puffs into the lungs every 4 (four) hours as needed for wheezing or shortness of breath. Qty: 18 g, Refills: 3      STOP taking these medications     amLODipine (NORVASC) 5 MG tablet          DISCHARGE INSTRUCTIONS:   Follow-up with primary care physician in a week Follow-up with [facility] cardiology Dr.Paraschos  in a week   DIET:  Cardiac diet, diabetic  DISCHARGE CONDITION:  Stable  ACTIVITY:  Activity as tolerated  OXYGEN:  Home Oxygen: No.   Oxygen Delivery: room air  DISCHARGE LOCATION:  home   If you experience worsening of your admission symptoms, develop shortness of breath, life threatening emergency, suicidal or homicidal thoughts you must seek medical attention immediately by calling 911 or calling your MD immediately  if symptoms less severe.  You Must read complete instructions/literature along with all the possible adverse reactions/side effects for all the Medicines you take and that have been prescribed to you. Take any new Medicines after you have completely understood and accpet all the possible adverse reactions/side effects.   Please note  You were cared for by a hospitalist during your hospital stay. If you have any questions about your discharge medications or the care you received while you were in the hospital after you are discharged, you can call the unit and asked to speak  with the hospitalist on call if the hospitalist that took care of you is not available. Once you are discharged, your primary care physician will handle any further medical issues. Please note that NO REFILLS for any discharge medications will be authorized once you are discharged, as it is imperative that you return to your primary care physician (or establish a relationship with a primary care physician if you do not have one) for your aftercare needs so that they can reassess your need for medications and monitor your lab values.     Today  Chief Complaint  Patient presents with  . Chest Pain    Patient is doing fine. Denies any chest pain. Okay to discharge from cardiology standpoint   ROS:  CONSTITUTIONAL: Denies fevers, chills. Denies any fatigue, weakness.  EYES: Denies blurry vision, double vision, eye pain. EARS, NOSE, THROAT: Denies tinnitus, ear pain, hearing loss. RESPIRATORY: Denies cough, wheeze, shortness of breath.  CARDIOVASCULAR: Denies chest pain, palpitations, edema.  GASTROINTESTINAL: Denies nausea, vomiting, diarrhea, abdominal pain. Denies bright red blood per rectum. GENITOURINARY: Denies dysuria, hematuria. ENDOCRINE: Denies nocturia or thyroid problems. HEMATOLOGIC AND LYMPHATIC: Denies easy bruising or bleeding. SKIN: Denies rash or lesion. MUSCULOSKELETAL: Denies pain in neck, back, shoulder, knees, hips or arthritic symptoms.  NEUROLOGIC: Denies paralysis, paresthesias.  PSYCHIATRIC: Denies anxiety or depressive symptoms.   VITAL SIGNS:  Blood pressure (!) 126/48, pulse 73, temperature 98.4 F (36.9 C), temperature source Oral, resp. rate 18, height 5\' 4"  (1.626 m), weight 88 kg (194 lb), SpO2 91 %.  I/O:    Intake/Output Summary (Last 24 hours) at 11/27/16 1426 Last data filed at 11/27/16 0930  Gross per 24 hour  Intake              240 ml  Output             2200 ml  Net            -1960 ml    PHYSICAL EXAMINATION:  GENERAL:  71  y.o.-year-old patient lying in the bed with no acute distress.  EYES: Pupils equal, round, reactive to light and accommodation. No scleral icterus. Extraocular muscles intact.  HEENT: Head atraumatic, normocephalic. Oropharynx and nasopharynx clear.  NECK:  Supple, no jugular venous distention. No thyroid enlargement, no tenderness.  LUNGS: Normal breath sounds bilaterally, no wheezing, rales,rhonchi or crepitation. No use of accessory muscles of respiration.  CARDIOVASCULAR: S1, S2 normal. No murmurs, rubs, or gallops.  ABDOMEN: Soft, non-tender, non-distended. Bowel  sounds present. No organomegaly or mass.  EXTREMITIES: No pedal edema, cyanosis, or clubbing. Groin site is intact with clean dressing, no bleeding NEUROLOGIC: Cranial nerves II through XII are intact. Muscle strength 5/5 in all extremities. Sensation intact. Gait not checked.  PSYCHIATRIC: The patient is alert and oriented x 3.  SKIN: No obvious rash, lesion, or ulcer.   DATA REVIEW:   CBC  Recent Labs Lab 11/27/16 0424  WBC 6.4  HGB 13.6  HCT 41.2  PLT 123*    Chemistries   Recent Labs Lab 11/26/16 0810 11/27/16 0424  NA 138 138  K 4.0 4.1  CL 102 103  CO2 29 28  GLUCOSE 163* 165*  BUN 13 12  CREATININE 0.75 0.65  CALCIUM 8.9 8.6*  MG 2.1  --     Cardiac Enzymes  Recent Labs Lab 11/26/16 1546  TROPONINI <0.03    Microbiology Results  Results for orders placed or performed in visit on 05/07/16  Rapid strep screen (not at [facility])     Status: None   Collection Time: 05/07/16  1:17 PM  Result Value Ref Range Status   Strep Gp A Ag, IA W/Reflex Negative Negative Final  Culture, Group A Strep     Status: None   Collection Time: 05/07/16  1:17 PM  Result Value Ref Range Status   Strep A Culture Negative  Final    RADIOLOGY:  Dg Chest Port 1 View  Result Date: 11/26/2016 CLINICAL DATA:  Awoke with central chest pain. EXAM: PORTABLE CHEST 1 VIEW COMPARISON:  03/11/2016 FINDINGS: Imaging obtained  in anti lordotic positioning, patient's chin obscures the apices. Unchanged heart size and mediastinal contours. There is mild bronchial thickening. Minimal streaky infrahilar atelectasis. No confluent airspace disease. No evidence of pneumothorax, pleural effusion or pulmonary edema. No acute osseous abnormalities are seen. IMPRESSION: Bronchial thickening with streaky bibasilar atelectasis. Electronically Signed   By: Jeb Levering M.D.   On: 11/26/2016 01:36    EKG:   Orders placed or performed during the hospital encounter of 11/26/16  . EKG 12-Lead  . EKG 12-Lead  . EKG 12-Lead  . EKG 12-Lead  . ED EKG within 10 minutes  . ED EKG within 10 minutes  . EKG 12-lead  . EKG 12-Lead  . EKG 12-Lead  . EKG 12-Lead      Management plans discussed with the patient, family and they are in agreement.  CODE STATUS:     Code Status Orders        Start     Ordered   11/26/16 0403  Do not attempt resuscitation (DNR)  Continuous    Question Answer Comment  In the event of cardiac or respiratory ARREST Do not call a "code blue"   In the event of cardiac or respiratory ARREST Do not perform Intubation, CPR, defibrillation or ACLS   In the event of cardiac or respiratory ARREST Use medication by any route, position, wound care, and other measures to relive pain and suffering. May use oxygen, suction and manual treatment of airway obstruction as needed for comfort.   Comments Confirmed with patient that she does NOT want CPR or intubation.      11/26/16 0402    Code Status History    Date Active Date Inactive Code Status Order ID Comments User Context   03/11/2016  6:56 PM 03/12/2016  1:03 PM DNR [id number]  Demetrios Loll, MD Inpatient   07/22/2015 12:42 PM 07/25/2015  9:49 PM Partial Code [id number]  Shanon Brow  Achille Rich, MD Inpatient   07/17/2015  9:19 PM 07/22/2015 12:42 PM DNR [id number]  Nicholes Mango, MD Inpatient    Advance Directive Documentation     Most Recent Value  Type of Advance  Directive  Healthcare Power of [place], Living will [place] L[place]oyd, son]  Pre-existing out of facility DNR order (yellow form or pink MOST form)  -  "MOST" Form in Place?  -      TOTAL TIME TAKING CARE OF THIS PATIENT: 45 minutes.   Note: This dictation was prepared with Dragon dictation along with smaller phrase technology. Any transcriptional errors that result from this process are unintentional.   @[facility] @  on 11/27/2016 at 2:26 PM  Between 7am to 6pm - Pager - [phone]  After 6pm go to [email or web address] - password [email or web address] [facility]  Office  [phone]  CC: Primary care physician; Park Liter, DO

## 2016-11-27 NOTE — Care Management Important Message (Signed)
Important Message  Patient Details  Name: Sarah White MRN: [id number] Date of Birth: [birth year]   Medicare Important Message Given:  Yes Signed notice given   Katrina Stack, RN 11/27/2016, 9:01 AM

## 2016-11-27 NOTE — Progress Notes (Signed)
Patient given discharge teaching and paperwork regarding medications, diet, follow-up appointments and activity. Patient understanding verbalized. No complaints at this time. . IV and telemetry discontinued prior to leaving. Skin assessment as previously charted and vitals are stable; on room air. Patient being discharged to home. Caregiver/family present during discharge teaching. No further needs by Care Management. Prescriptions handed to patient.

## 2016-11-27 NOTE — Progress Notes (Signed)
Inpatient Diabetes Program Recommendations  AACE/ADA: New Consensus Statement on Inpatient Glycemic Control (2015)  Target Ranges:  Prepandial:   less than 140 mg/dL      Peak postprandial:   less than 180 mg/dL (1-2 hours)      Critically ill patients:  140 - 180 mg/dL   Results for Sarah White, Sarah White (MRN [id number]) as of 11/27/2016 10:54  Ref. Range 11/26/2016 01:02 11/26/2016 08:10 11/27/2016 04:24  Glucose Latest Ref Range: 65 - 99 mg/dL 230 (H) 163 (H) 165 (H)     Diabetes history: DM2  Outpatient Diabetes medications: Invokana 409 mg QAM, Trulicity 8.11 mg once a week  Current orders for Inpatient glycemic control: None     MD- Please consider placing orders for Novolog Sensitive Correction Scale/ SSI (0-9 units) TID AC + HS     --Will follow patient during hospitalization--  Wyn Quaker RN, MSN, CDE Diabetes Coordinator Inpatient Glycemic Control Team Team Pager: [phone] (8a-5p)

## 2016-11-27 NOTE — Discharge Instructions (Signed)
Follow-up with primary care physician in a week Follow-up with [facility] cardiology Dr.Paraschos in a week

## 2016-11-27 NOTE — Care Management (Signed)
No discharge needs identified by members of the care team.  Patient currently on oxygen which is acute.   Discussed need to wean and discontinue during progression

## 2016-12-01 ENCOUNTER — Ambulatory Visit (INDEPENDENT_AMBULATORY_CARE_PROVIDER_SITE_OTHER): Payer: Medicare Other | Admitting: Family Medicine

## 2016-12-01 ENCOUNTER — Encounter: Payer: Self-pay | Admitting: Family Medicine

## 2016-12-01 VITALS — BP 109/72 | HR 80 | Temp 98.2°F | Resp 17 | Ht 64.0 in | Wt 198.0 lb

## 2016-12-01 DIAGNOSIS — F321 Major depressive disorder, single episode, moderate: Secondary | ICD-10-CM | POA: Diagnosis not present

## 2016-12-01 DIAGNOSIS — I200 Unstable angina: Secondary | ICD-10-CM | POA: Diagnosis not present

## 2016-12-01 DIAGNOSIS — E1122 Type 2 diabetes mellitus with diabetic chronic kidney disease: Secondary | ICD-10-CM

## 2016-12-01 DIAGNOSIS — I2510 Atherosclerotic heart disease of native coronary artery without angina pectoris: Secondary | ICD-10-CM

## 2016-12-01 DIAGNOSIS — N183 Chronic kidney disease, stage 3 (moderate): Secondary | ICD-10-CM

## 2016-12-01 MED ORDER — SERTRALINE HCL 50 MG PO TABS: ORAL_TABLET | ORAL | 3 refills | Status: DC

## 2016-12-01 MED ORDER — DULAGLUTIDE 1.5 MG/0.5ML ~~LOC~~ SOAJ: 1.5000 mg | SUBCUTANEOUS | 6 refills | Status: DC

## 2016-12-01 NOTE — Progress Notes (Signed)
BP 109/72 (BP Location: Left Arm, Patient Position: Sitting, Cuff Size: Large)   Pulse 80   Temp 98.2 F (36.8 C) (Oral)   Resp 17   Ht 5\' 4"  (1.626 m)   Wt 198 lb (89.8 kg)   SpO2 94%   BMI 33.99 kg/m    Subjective:    [name], female    DOB: 1946/04/26, 71 y.o.   MRN: [id number]  HPI: Sarah White is a 71 y.o. female  Chief Complaint  Patient presents with  . Hospitalization Follow-up   HOSPITAL FOLLOW UP Time since discharge: 4 days Hospital/facility: [facility] Diagnosis: Unstable angina HOSPITAL COURSE:   HPI: JaniceLloydis a 71 y.o.femalewith a known history of Hypertension, hyperlipidemia DM, COPD Not on home O2, CHF, GERD CAD status post MI with 2 stents presents to the emergency department for evaluation of chest pain. Patient was in a usual state of health until this evening when she was at rest she developed central substernal chest pain described as 8/10, radiating to her back and left shoulder associated with diaphoresis, nausea and vomiting 1,anxiety. Patient states that she has had chest pain in the past but never this severe. At home she had taken 4 baby aspirin, both doses of her Coreg and her statin. In reviewing the EMS she received 1 dose of nitroglycerin spray and 4 mg of Zofran which decreased her pain to a 3 out of 10.  Otherwise there has been no change in status. Patient has been taking medication as prescribed and there has been no recent change in medication or diet. No recent antibiotics. There has been no recent illness, hospitalizations, travel or sick contacts.    #.Unstable angina h/o CAD s/p MI and stents Chest pain resolved -Cardiac catheterization done today Two-vessel coronary artery disease with occluded proximal RCA, an occluded mid left circumflex, with contralateral collaterals to distal RCA. Normal left ventricular function -Recommending medical management and risk factor modification -Patient is started on  Imdur, doing fine now  -- Heparin and nitro dripsdiscontinued -- Morphine, nitro, beta blocker, aspirin and statin ordered.  - Outpatient follow-up with cardiology -TSH normal -Sublingual nitroglycerin as needed basis  #. History of hypertension -Continue Coreg twice a day -Patient is started on Imdur, discontinue Norvasc  #. H/o Diabetes - Accuchecks q4hwith RISS coverage during the hospital course - Resume home dose oral hypoglycemics - #. History of hyperlipidemia - Cont Lipitor  #. History of of congestive heart failure - Cont Coreg, Lasix, potassium  #. History of of COPD - ContSpiriva, Symbicort -O2 and Med-Neb therapy as needed   Procedures/tests: Cardiac cath  Lat 1st Mrg lesion, 100 %stenosed.  Prox Cx lesion, 75 %stenosed.  Prox Cx to Mid Cx lesion, 100 %stenosed.  Prox LAD lesion, 40 %stenosed.  Prox RCA lesion, 100 %stenosed.  Consultants: Cardiology New medications: Imdur, nitroglycerin, stopped amlodipine Discharge instructions:  Follow up here and with cardiology Status: better  DIABETES- had A1c checked in the hospital and went up to 9.6 from 9.5 Hypoglycemic episodes:no Polydipsia/polyuria: no Visual disturbance: no Chest pain: yes Paresthesias: no Glucose Monitoring: no Taking Insulin?: no Blood Pressure Monitoring: not checking Retinal Examination: Not up to Date Foot Exam: Up to Date Diabetic Education: Completed Pneumovax: Up to Date Influenza: Up to Date Aspirin: yes  DEPRESSION Mood status: uncontrolled Satisfied with current treatment?: no Symptom severity: moderate  Duration of current treatment : Not on anything Psychotherapy/counseling: no  Previous psychiatric medications: none Depressed mood: yes Anxious  mood: yes Anhedonia: yes Significant weight loss or gain: no Insomnia: yes hard to fall asleep Fatigue: yes Feelings of worthlessness or guilt: yes Impaired concentration/indecisiveness: yes Suicidal  ideations: no Hopelessness: yes Crying spells: yes Depression screen [facility] 2/9 12/01/2016 09/15/2016 07/29/2015  Decreased Interest 3 0 0  Down, Depressed, Hopeless 3 0 0  PHQ - 2 Score 6 0 0  Altered sleeping 2 - -  Tired, decreased energy 3 - -  Change in appetite 2 - -  Feeling bad or failure about yourself  2 - -  Trouble concentrating 2 - -  Moving slowly or fidgety/restless 0 - -  Suicidal thoughts 0 - -  PHQ-9 Score 17 - -     Relevant past medical, surgical, family and social history reviewed and updated as indicated. Interim medical history since our last visit reviewed. Allergies and medications reviewed and updated.  Review of Systems  Constitutional: Positive for fatigue. Negative for activity change, appetite change, chills, diaphoresis, fever and unexpected weight change.  Respiratory: Negative.   Cardiovascular: Negative.   Psychiatric/Behavioral: Positive for decreased concentration, dysphoric mood and sleep disturbance. Negative for agitation, behavioral problems, confusion, hallucinations, self-injury and suicidal ideas. The patient is nervous/anxious. The patient is not hyperactive.     Per HPI unless specifically indicated above     Objective:    BP 109/72 (BP Location: Left Arm, Patient Position: Sitting, Cuff Size: Large)   Pulse 80   Temp 98.2 F (36.8 C) (Oral)   Resp 17   Ht 5\' 4"  (1.626 m)   Wt 198 lb (89.8 kg)   SpO2 94%   BMI 33.99 kg/m   Wt Readings from Last 3 Encounters:  12/01/16 198 lb (89.8 kg)  11/26/16 194 lb (88 kg)  09/15/16 198 lb 1.6 oz (89.9 kg)    Physical Exam  Constitutional: She is oriented to person, place, and time. She appears well-developed and well-nourished. No distress.  HENT:  Head: Normocephalic and atraumatic.  Right Ear: Hearing normal.  Left Ear: Hearing normal.  Nose: Nose normal.  Eyes: Conjunctivae and lids are normal. Right eye exhibits no discharge. Left eye exhibits no discharge. No scleral icterus.    Cardiovascular: Normal rate, regular rhythm, normal heart sounds and intact distal pulses.  Exam reveals no gallop and no friction rub.   No murmur heard. Pulmonary/Chest: Effort normal and breath sounds normal. No respiratory distress. She has no wheezes. She has no rales. She exhibits no tenderness.  Musculoskeletal: Normal range of motion.  Neurological: She is alert and oriented to person, place, and time.  Skin: Skin is warm, dry and intact. No rash noted. She is not diaphoretic. No erythema. No pallor.  Psychiatric: She has a normal mood and affect. Her speech is normal and behavior is normal. Judgment and thought content normal. Cognition and memory are normal.  Nursing note and vitals reviewed.   Results for orders placed or performed during the hospital encounter of 78/93/81  Basic metabolic panel  Result Value Ref Range   Sodium 140 135 - 145 mmol/L   Potassium 4.1 3.5 - 5.1 mmol/L   Chloride 102 101 - 111 mmol/L   CO2 29 22 - 32 mmol/L   Glucose, Bld 230 (H) 65 - 99 mg/dL   BUN 13 6 - 20 mg/dL   Creatinine, Ser 0.80 0.44 - 1.00 mg/dL   Calcium 8.8 (L) 8.9 - 10.3 mg/dL   GFR calc non Af Amer >60 >60 mL/min   GFR calc Af  Amer >60 >60 mL/min   Anion gap 9 5 - 15  CBC  Result Value Ref Range   WBC 11.2 (H) 3.6 - 11.0 K/uL   RBC 5.59 (H) 3.80 - 5.20 MIL/uL   Hemoglobin 15.1 12.0 - 16.0 g/dL   HCT 45.4 35.0 - 47.0 %   MCV 81.3 80.0 - 100.0 fL   MCH 27.1 26.0 - 34.0 pg   MCHC 33.3 32.0 - 36.0 g/dL   RDW 15.3 (H) 11.5 - 14.5 %   Platelets 147 (L) 150 - 440 K/uL  Troponin I  Result Value Ref Range   Troponin I <0.03 <0.03 ng/mL  APTT  Result Value Ref Range   aPTT 46 (H) 24 - 36 seconds  Heparin level (unfractionated)  Result Value Ref Range   Heparin Unfractionated 0.31 0.30 - 0.70 IU/mL  Protime-INR  Result Value Ref Range   Prothrombin Time 13.8 11.4 - 15.2 seconds   INR 1.06   Heparin level (unfractionated)  Result Value Ref Range   Heparin Unfractionated  0.31 0.30 - 0.70 IU/mL  Magnesium  Result Value Ref Range   Magnesium 2.1 1.7 - 2.4 mg/dL  TSH  Result Value Ref Range   TSH 1.992 0.350 - 4.500 uIU/mL  Troponin I  Result Value Ref Range   Troponin I <0.03 <0.03 ng/mL  Troponin I  Result Value Ref Range   Troponin I <0.03 <0.03 ng/mL  Troponin I  Result Value Ref Range   Troponin I <0.03 <0.03 ng/mL  Hemoglobin A1c  Result Value Ref Range   Hgb A1c MFr Bld 9.6 (H) 4.8 - 5.6 %   Mean Plasma Glucose 229 mg/dL  Brain natriuretic peptide  Result Value Ref Range   B Natriuretic Peptide 52.0 0.0 - 100.0 pg/mL  Basic metabolic panel  Result Value Ref Range   Sodium 138 135 - 145 mmol/L   Potassium 4.0 3.5 - 5.1 mmol/L   Chloride 102 101 - 111 mmol/L   CO2 29 22 - 32 mmol/L   Glucose, Bld 163 (H) 65 - 99 mg/dL   BUN 13 6 - 20 mg/dL   Creatinine, Ser 0.75 0.44 - 1.00 mg/dL   Calcium 8.9 8.9 - 10.3 mg/dL   GFR calc non Af Amer >60 >60 mL/min   GFR calc Af Amer >60 >60 mL/min   Anion gap 7 5 - 15  Lipid panel  Result Value Ref Range   Cholesterol 158 0 - 200 mg/dL   Triglycerides 254 (H) <150 mg/dL   HDL 37 (L) >40 mg/dL   Total CHOL/HDL Ratio 4.3 RATIO   VLDL 51 (H) 0 - 40 mg/dL   LDL Cholesterol 70 0 - 99 mg/dL  CBC  Result Value Ref Range   WBC 7.2 3.6 - 11.0 K/uL   RBC 5.36 (H) 3.80 - 5.20 MIL/uL   Hemoglobin 14.4 12.0 - 16.0 g/dL   HCT 43.7 35.0 - 47.0 %   MCV 81.5 80.0 - 100.0 fL   MCH 27.0 26.0 - 34.0 pg   MCHC 33.1 32.0 - 36.0 g/dL   RDW 15.2 (H) 11.5 - 14.5 %   Platelets 148 (L) 150 - 440 K/uL  Protime-INR  Result Value Ref Range   Prothrombin Time 13.8 11.4 - 15.2 seconds   INR 1.06   CBC  Result Value Ref Range   WBC 6.4 3.6 - 11.0 K/uL   RBC 4.97 3.80 - 5.20 MIL/uL   Hemoglobin 13.6 12.0 - 16.0 g/dL   HCT  41.2 35.0 - 47.0 %   MCV 82.8 80.0 - 100.0 fL   MCH 27.3 26.0 - 34.0 pg   MCHC 33.0 32.0 - 36.0 g/dL   RDW 15.4 (H) 11.5 - 14.5 %   Platelets 123 (L) 150 - 440 K/uL  Basic metabolic panel   Result Value Ref Range   Sodium 138 135 - 145 mmol/L   Potassium 4.1 3.5 - 5.1 mmol/L   Chloride 103 101 - 111 mmol/L   CO2 28 22 - 32 mmol/L   Glucose, Bld 165 (H) 65 - 99 mg/dL   BUN 12 6 - 20 mg/dL   Creatinine, Ser 0.65 0.44 - 1.00 mg/dL   Calcium 8.6 (L) 8.9 - 10.3 mg/dL   GFR calc non Af Amer >60 >60 mL/min   GFR calc Af Amer >60 >60 mL/min   Anion gap 7 5 - 15  Echocardiogram  Result Value Ref Range   Weight 3,110.4 oz   Height 64 in   BP 143/51 mmHg      Assessment & Plan:   Problem List Items Addressed This Visit      Cardiovascular and Mediastinum   CAD (coronary artery disease) - Primary    Seeing Dr. Josefa Half next week. Will check CMP today. Continue medical management. Continue to monitor.       Relevant Orders   Comprehensive metabolic panel   Unstable angina (HCC)    Seeing Dr. Josefa Half next week. Will check CMP today. Continue medical management. Continue to monitor.       Relevant Orders   Comprehensive metabolic panel     Endocrine   Diabetes mellitus with chronic kidney disease ([place])    Not under good control with A1c still at 9.6- will increase trulicity to 1.5 and recheck in 3 months.       Relevant Medications   Dulaglutide (TRULICITY) 1.5 TM/1.9QQ SOPN     Other   Depression, major, single episode, moderate ([place])    In part due to medical issues. Will start her on zoloft and recheck in 1 month. Call with any concerns.       Relevant Medications   sertraline (ZOLOFT) 50 MG tablet       Follow up plan: Return in about 4 weeks (around 12/29/2016) for Follow up mood.

## 2016-12-01 NOTE — Assessment & Plan Note (Signed)
Not under good control with A1c still at 9.6- will increase trulicity to 1.5 and recheck in 3 months.

## 2016-12-01 NOTE — Assessment & Plan Note (Signed)
In part due to medical issues. Will start her on zoloft and recheck in 1 month. Call with any concerns.

## 2016-12-01 NOTE — Assessment & Plan Note (Signed)
Seeing Dr. Josefa Half next week. Will check CMP today. Continue medical management. Continue to monitor.

## 2016-12-02 LAB — COMPREHENSIVE METABOLIC PANEL
A/G RATIO: 1.8 (ref 1.2–2.2)
ALT: 68 IU/L — AB (ref 0–32)
AST: 15 IU/L (ref 0–40)
Albumin: 4 g/dL (ref 3.6–4.8)
Alkaline Phosphatase: 156 IU/L — ABNORMAL HIGH (ref 39–117)
BILIRUBIN TOTAL: 0.4 mg/dL (ref 0.0–1.2)
BUN/Creatinine Ratio: 16 (ref 12–28)
BUN: 11 mg/dL (ref 8–27)
CALCIUM: 8.9 mg/dL (ref 8.7–10.3)
CHLORIDE: 94 mmol/L — AB (ref 96–106)
CO2: 24 mmol/L (ref 18–29)
Creatinine, Ser: 0.69 mg/dL (ref 0.57–1.00)
GFR calc Af Amer: 103 mL/min/{1.73_m2} (ref >59)
GFR calc non Af Amer: 90 mL/min/{1.73_m2} (ref >59)
GLUCOSE: 215 mg/dL — AB (ref 65–99)
Globulin, Total: 2.2 g/dL (ref 1.5–4.5)
POTASSIUM: 4.4 mmol/L (ref 3.5–5.2)
Sodium: 137 mmol/L (ref 134–144)
TOTAL PROTEIN: 6.2 g/dL (ref 6.0–8.5)

## 2016-12-07 ENCOUNTER — Telehealth: Payer: Self-pay | Admitting: *Deleted

## 2016-12-07 MED ORDER — CANAGLIFLOZIN 100 MG PO TABS: 100.0000 mg | ORAL_TABLET | Freq: Every day | ORAL | 1 refills | Status: DC

## 2016-12-07 NOTE — Telephone Encounter (Signed)
Pharmacy requesting Invokana 100 mg tabs #90

## 2016-12-09 DIAGNOSIS — I5022 Chronic systolic (congestive) heart failure: Secondary | ICD-10-CM | POA: Diagnosis not present

## 2016-12-09 DIAGNOSIS — R0602 Shortness of breath: Secondary | ICD-10-CM | POA: Diagnosis not present

## 2016-12-09 DIAGNOSIS — I10 Essential (primary) hypertension: Secondary | ICD-10-CM | POA: Diagnosis not present

## 2016-12-09 DIAGNOSIS — R600 Localized edema: Secondary | ICD-10-CM | POA: Diagnosis not present

## 2016-12-09 DIAGNOSIS — E669 Obesity, unspecified: Secondary | ICD-10-CM | POA: Diagnosis not present

## 2016-12-09 DIAGNOSIS — E785 Hyperlipidemia, unspecified: Secondary | ICD-10-CM | POA: Diagnosis not present

## 2016-12-09 DIAGNOSIS — R42 Dizziness and giddiness: Secondary | ICD-10-CM | POA: Diagnosis not present

## 2016-12-09 DIAGNOSIS — I2510 Atherosclerotic heart disease of native coronary artery without angina pectoris: Secondary | ICD-10-CM | POA: Diagnosis not present

## 2016-12-09 DIAGNOSIS — J189 Pneumonia, unspecified organism: Secondary | ICD-10-CM | POA: Diagnosis not present

## 2016-12-09 DIAGNOSIS — I429 Cardiomyopathy, unspecified: Secondary | ICD-10-CM | POA: Diagnosis not present

## 2016-12-09 DIAGNOSIS — J449 Chronic obstructive pulmonary disease, unspecified: Secondary | ICD-10-CM | POA: Diagnosis not present

## 2016-12-09 DIAGNOSIS — F17200 Nicotine dependence, unspecified, uncomplicated: Secondary | ICD-10-CM | POA: Diagnosis not present

## 2017-01-01 ENCOUNTER — Ambulatory Visit (INDEPENDENT_AMBULATORY_CARE_PROVIDER_SITE_OTHER): Payer: Medicare Other | Admitting: Family Medicine

## 2017-01-01 ENCOUNTER — Encounter: Payer: Self-pay | Admitting: Family Medicine

## 2017-01-01 VITALS — BP 112/66 | HR 84 | Temp 99.1°F | Wt 198.0 lb

## 2017-01-01 DIAGNOSIS — F321 Major depressive disorder, single episode, moderate: Secondary | ICD-10-CM | POA: Diagnosis not present

## 2017-01-01 DIAGNOSIS — D485 Neoplasm of uncertain behavior of skin: Secondary | ICD-10-CM

## 2017-01-01 DIAGNOSIS — D0461 Carcinoma in situ of skin of right upper limb, including shoulder: Secondary | ICD-10-CM | POA: Diagnosis not present

## 2017-01-01 DIAGNOSIS — I200 Unstable angina: Secondary | ICD-10-CM

## 2017-01-01 NOTE — Assessment & Plan Note (Signed)
Under much better control. Continue current regimen. Continue to monitor. Recheck 6 months.  

## 2017-01-01 NOTE — Progress Notes (Signed)
BP 112/66 (BP Location: Left Arm, Patient Position: Sitting, Cuff Size: Large)   Pulse 84   Temp 99.1 F (37.3 C)   Wt 198 lb (89.8 kg)   SpO2 93%   BMI 33.99 kg/m    Subjective:    [name], female    DOB: [birth year], 71 y.o.   MRN: [id number]  HPI: Sarah White is a 71 y.o. female  Chief Complaint  Patient presents with  . Depression  . Skin Lesion    Right inner elbow. Patient states that it has been there off and one for about 1 year, when it comes back it appears as a blister   DEPRESSION- got taken off her lipitor, feels like she's doing better without it. Mood status: better Satisfied with current treatment?: yes Symptom severity: mild  Duration of current treatment : chronic Side effects: no Medication compliance: excellent compliance Psychotherapy/counseling: no  Previous psychiatric medications: zolft Depressed mood: no Anxious mood: no Anhedonia: no Significant weight loss or gain: no Insomnia: no  Fatigue: yes Feelings of worthlessness or guilt: no Impaired concentration/indecisiveness: no Suicidal ideations: no Hopelessness: no Crying spells: no Depression screen [facility] 2/9 01/01/2017 12/01/2016 09/15/2016 07/29/2015  Decreased Interest 0 3 0 0  Down, Depressed, Hopeless 0 3 0 0  PHQ - 2 Score 0 6 0 0  Altered sleeping 0 2 - -  Tired, decreased energy 2 3 - -  Change in appetite 0 2 - -  Feeling bad or failure about yourself  0 2 - -  Trouble concentrating 1 2 - -  Moving slowly or fidgety/restless 0 0 - -  Suicidal thoughts 0 0 - -  PHQ-9 Score 3 17 - -   SKIN LESION Duration: about a year Location:  R antecubital fossa Painful: no Itching: yes Onset: gradual Context: not changing Associated signs and symptoms: oozing History of skin cancer: no History of precancerous skin lesions: no  Relevant past medical, surgical, family and social history reviewed and updated as indicated. Interim medical history since our last visit  reviewed. Allergies and medications reviewed and updated.  Review of Systems  Constitutional: Negative.   Respiratory: Negative.   Cardiovascular: Negative.   Skin: Positive for wound. Negative for color change, pallor and rash.  Psychiatric/Behavioral: Negative.     Per HPI unless specifically indicated above     Objective:    BP 112/66 (BP Location: Left Arm, Patient Position: Sitting, Cuff Size: Large)   Pulse 84   Temp 99.1 F (37.3 C)   Wt 198 lb (89.8 kg)   SpO2 93%   BMI 33.99 kg/m   Wt Readings from Last 3 Encounters:  01/01/17 198 lb (89.8 kg)  12/01/16 198 lb (89.8 kg)  11/26/16 194 lb (88 kg)    Physical Exam  Constitutional: She is oriented to person, place, and time. She appears well-developed and well-nourished. No distress.  HENT:  Head: Normocephalic and atraumatic.  Right Ear: Hearing normal.  Left Ear: Hearing normal.  Nose: Nose normal.  Eyes: Conjunctivae and lids are normal. Right eye exhibits no discharge. Left eye exhibits no discharge. No scleral icterus.  Cardiovascular: Normal rate, regular rhythm, normal heart sounds and intact distal pulses.  Exam reveals no gallop and no friction rub.   No murmur heard. Pulmonary/Chest: Effort normal and breath sounds normal. No respiratory distress. She has no wheezes. She has no rales. She exhibits no tenderness.  Musculoskeletal: Normal range of motion.  Neurological: She is  alert and oriented to person, place, and time.  Skin: Skin is warm, dry and intact. No rash noted. She is not diaphoretic. No erythema. No pallor.  Non-healing wound R antecubital fossa  Psychiatric: She has a normal mood and affect. Her speech is normal and behavior is normal. Judgment and thought content normal. Cognition and memory are normal.  Nursing note and vitals reviewed.   Results for orders placed or performed in visit on 12/01/16  Comprehensive metabolic panel  Result Value Ref Range   Glucose 215 (H) 65 - 99 mg/dL    BUN 11 8 - 27 mg/dL   Creatinine, Ser 0.69 0.57 - 1.00 mg/dL   GFR calc non Af Amer 90 >59 mL/min/1.73   GFR calc Af Amer 103 >59 mL/min/1.73   BUN/Creatinine Ratio 16 12 - 28   Sodium 137 134 - 144 mmol/L   Potassium 4.4 3.5 - 5.2 mmol/L   Chloride 94 (L) 96 - 106 mmol/L   CO2 24 18 - 29 mmol/L   Calcium 8.9 8.7 - 10.3 mg/dL   Total Protein 6.2 6.0 - 8.5 g/dL   Albumin 4.0 3.6 - 4.8 g/dL   Globulin, Total 2.2 1.5 - 4.5 g/dL   Albumin/Globulin Ratio 1.8 1.2 - 2.2   Bilirubin Total 0.4 0.0 - 1.2 mg/dL   Alkaline Phosphatase 156 (H) 39 - 117 IU/L   AST 15 0 - 40 IU/L   ALT 68 (H) 0 - 32 IU/L      Assessment & Plan:   Problem List Items Addressed This Visit      Other   Depression, major, single episode, moderate ([place]) - Primary    Under much better control. Continue current regimen. Continue to monitor. Recheck 6 months.        Other Visit Diagnoses    Neoplasm of uncertain behavior of skin of upper arm       Shave biopsy done today. Await results.   Relevant Orders   Pathology Report      Skin Procedure  Procedure: Informed consent given.  Sterile prep of the area.  Area infiltrated with lidocaine with epinephrine.  Using a dermablade, part of the upper dermis shaved off and sent  for pathology.     Diagnosis:   ICD-9-CM ICD-10-CM   1. Depression, major, single episode, moderate (HCC) 296.22 F32.1   2. 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 Await results.    Lesion Location/Size: 0.5cm non-healing wound Physician: MJ Consent:  Risks, benefits, and alternative treatments discussed and all questions were answered.  Patient elected to proceed and verbal consent obtained.  Description: Area prepped and draped using semi-sterile technique. Area locally anesthetized using 4.5 cc's of lidocaine 1% with epi. Shave biopsy of lesion performed using a dermablade.  Adequate hemostastis achieved using Silver  Nitrate. Wound dressed after application of bacitracin ointment. Post Procedure Instructions: Wound care instructions discussed and patient was instructed to keep area clean and dry.  Signs and symptoms of infection discussed, patient agrees to contact the office ASAP should they occur.  Dressing change recommended every other day.   Follow up plan: Return End of June, for Diabetes visit.

## 2017-01-07 ENCOUNTER — Telehealth: Payer: Self-pay | Admitting: Family Medicine

## 2017-01-07 DIAGNOSIS — C44622 Squamous cell carcinoma of skin of right upper limb, including shoulder: Secondary | ICD-10-CM

## 2017-01-07 LAB — PATHOLOGY

## 2017-01-07 NOTE — Telephone Encounter (Signed)
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 Will refer to dermatology- she would like to go to [facility]. Referral generated today.

## 2017-01-11 ENCOUNTER — Other Ambulatory Visit: Payer: Self-pay | Admitting: Family Medicine

## 2017-01-11 MED ORDER — SERTRALINE HCL 50 MG PO TABS: ORAL_TABLET | ORAL | 3 refills | Status: DC

## 2017-01-11 NOTE — Telephone Encounter (Signed)
  Last routine OV: 01/01/17 Next OV: 02/22/17

## 2017-01-11 NOTE — Telephone Encounter (Signed)
Patient needs script sent to [facility] on [address] for her Sertraline  Thanks  [phone] if you need to reach [place]

## 2017-01-14 DIAGNOSIS — C44622 Squamous cell carcinoma of skin of right upper limb, including shoulder: Secondary | ICD-10-CM | POA: Diagnosis not present

## 2017-01-15 ENCOUNTER — Telehealth: Payer: Self-pay

## 2017-01-15 NOTE — Telephone Encounter (Signed)
[facility] request Pathology results. Results printed and faxed to [place]. Request placed in scan box.

## 2017-01-23 ENCOUNTER — Other Ambulatory Visit: Payer: Self-pay | Admitting: Family Medicine

## 2017-02-01 DIAGNOSIS — L905 Scar conditions and fibrosis of skin: Secondary | ICD-10-CM | POA: Diagnosis not present

## 2017-02-01 DIAGNOSIS — D0461 Carcinoma in situ of skin of right upper limb, including shoulder: Secondary | ICD-10-CM | POA: Diagnosis not present

## 2017-02-15 ENCOUNTER — Other Ambulatory Visit: Payer: Self-pay | Admitting: Family Medicine

## 2017-02-22 ENCOUNTER — Encounter: Payer: Self-pay | Admitting: Family Medicine

## 2017-02-22 ENCOUNTER — Ambulatory Visit (INDEPENDENT_AMBULATORY_CARE_PROVIDER_SITE_OTHER): Payer: Medicare Other | Admitting: Family Medicine

## 2017-02-22 VITALS — BP 111/73 | HR 87 | Temp 98.2°F | Wt 195.3 lb

## 2017-02-22 DIAGNOSIS — I200 Unstable angina: Secondary | ICD-10-CM

## 2017-02-22 DIAGNOSIS — N183 Chronic kidney disease, stage 3 unspecified: Secondary | ICD-10-CM

## 2017-02-22 DIAGNOSIS — E1122 Type 2 diabetes mellitus with diabetic chronic kidney disease: Secondary | ICD-10-CM

## 2017-02-22 LAB — BAYER DCA HB A1C WAIVED: HB A1C: 7.2 % — AB (ref <7.0)

## 2017-02-22 NOTE — Progress Notes (Signed)
BP 111/73 (BP Location: Left Arm, Patient Position: Sitting, Cuff Size: Large)   Pulse 87   Temp 98.2 F (36.8 C)   Wt 195 lb 5 oz (88.6 kg)   SpO2 94%   BMI 33.53 kg/m    Subjective:    [name], female    DOB: 04/11/1946, 71 y.o.   MRN: [id number]  HPI: Sarah White is a 71 y.o. female  Chief Complaint  Patient presents with  . Diabetes   DIABETES Hypoglycemic episodes:no Polydipsia/polyuria: no Visual disturbance: no Chest pain: no Paresthesias: no Glucose Monitoring: yes- sugars running in the 250s-300s Taking Insulin?: no Blood Pressure Monitoring: not checking Retinal Examination: Not up to Date Foot Exam: Up to Date Diabetic Education: Completed Pneumovax: Up to Date Influenza: Up to Date Aspirin: yes  Relevant past medical, surgical, family and social history reviewed and updated as indicated. Interim medical history since our last visit reviewed. Allergies and medications reviewed and updated.  Review of Systems  Constitutional: Negative.   Respiratory: Negative.   Cardiovascular: Negative.   Psychiatric/Behavioral: Negative.     Per HPI unless specifically indicated above     Objective:    BP 111/73 (BP Location: Left Arm, Patient Position: Sitting, Cuff Size: Large)   Pulse 87   Temp 98.2 F (36.8 C)   Wt 195 lb 5 oz (88.6 kg)   SpO2 94%   BMI 33.53 kg/m   Wt Readings from Last 3 Encounters:  02/22/17 195 lb 5 oz (88.6 kg)  01/01/17 198 lb (89.8 kg)  12/01/16 198 lb (89.8 kg)    Physical Exam  Constitutional: She is oriented to person, place, and time. She appears well-developed and well-nourished. No distress.  HENT:  Head: Normocephalic and atraumatic.  Right Ear: Hearing normal.  Left Ear: Hearing normal.  Nose: Nose normal.  Eyes: Conjunctivae and lids are normal. Right eye exhibits no discharge. Left eye exhibits no discharge. No scleral icterus.  Cardiovascular: Normal rate, regular rhythm, normal heart  sounds and intact distal pulses.  Exam reveals no gallop and no friction rub.   No murmur heard. Pulmonary/Chest: Effort normal and breath sounds normal. No respiratory distress. She has no wheezes. She has no rales. She exhibits no tenderness.  Musculoskeletal: Normal range of motion.  Neurological: She is alert and oriented to person, place, and time.  Skin: Skin is warm, dry and intact. No rash noted. She is not diaphoretic. No erythema. No pallor.  Psychiatric: She has a normal mood and affect. Her speech is normal and behavior is normal. Judgment and thought content normal. Cognition and memory are normal.    Results for orders placed or performed in visit on 01/01/17  Pathology Report  Result Value Ref Range   PATH REPORT.SITE OF ORIGIN SPEC Comment    . Comment    PATH REPORT.RELEVANT HX SPEC Comment    PATH REPORT.FINAL DX SPEC Comment    PATH REPORT.COMMENTS IMP SPEC Comment:    SIGNED OUT BY: Comment    GROSS DESCRIPTION: Comment    MICROSCOPIC DESCRIPTION: Comment    . Comment    PAYMENT PROCEDURE Comment       Assessment & Plan:   Problem List Items Addressed This Visit      Endocrine   Diabetes mellitus with chronic kidney disease ([place]) - Primary    A1c down to 7.2! Out of trulicity and invokana due to cost- samples of invokana to get her through the donut hole given  today. Will contact invokana rep to see if there is anything they can do. Continue diet and exercise and current regimen and recheck in 3 months at physical.      Relevant Orders   Bayer DCA Hb A1c Waived       Follow up plan: Return in about 3 months (around 05/25/2017) for Wellness/follow up DM/Chol/BP.

## 2017-02-22 NOTE — Assessment & Plan Note (Signed)
A1c down to 7.2! Out of trulicity and invokana due to cost- samples of invokana to get her through the donut hole given today. Will contact invokana rep to see if there is anything they can do. Continue diet and exercise and current regimen and recheck in 3 months at physical.

## 2017-02-23 ENCOUNTER — Telehealth: Payer: Self-pay | Admitting: Family Medicine

## 2017-02-23 DIAGNOSIS — Z598 Other problems related to housing and economic circumstances: Secondary | ICD-10-CM

## 2017-02-23 DIAGNOSIS — Z599 Problem related to housing and economic circumstances, unspecified: Secondary | ICD-10-CM

## 2017-02-23 NOTE — Telephone Encounter (Signed)
-----   Message from Sandria Manly, [place] sent at 02/23/2017 10:42 AM EDT ----- I have entered in a PA for patient just to try to see if it'll work. Fingers crossed. If not there's really nothing else to do. Patient is medicare which excludes her from those patient assistance programs and coupons.   The next step would be [facility] if the PA is not approved.  ----- Message ----- From: Valerie Roys, DO Sent: 02/22/2017   2:05 PM To: Sandria Manly, CMA  Patient is in the donut hole-- can we contact invokanna rep and see if there is anything we or they can do to help her get and keep her medicine??

## 2017-02-23 NOTE — Telephone Encounter (Signed)
Referral to [facility] generated today. I do know that the reps can sometime ship patients samples to get them through the donut hole- not sure if they could do this for her. Thanks!

## 2017-02-23 NOTE — Telephone Encounter (Signed)
Patient notified and stated she will come pick up the form tomorrow.  Form in envelope with patient's name in file drawer.  FYI to Santiago Glad.

## 2017-02-23 NOTE — Telephone Encounter (Signed)
Form filled out

## 2017-02-23 NOTE — Telephone Encounter (Signed)
After doing more digging, I found a [employer] patient assistance program. (not from invokana directly). Dr. Wynetta Emery has to complete a form and then the patient has to complete the rest. Form given to Dr. Wynetta Emery.

## 2017-02-23 NOTE — Telephone Encounter (Signed)
[place], Keri L, CMA  Kaleb Linquist, [place], DO        PA was not required because it is part of the patient's covered drugs. Again, she's excluded from coupons and patient assistance programs from the company because she has medicare.   Referral to [facility] or [facility]?

## 2017-02-24 NOTE — Telephone Encounter (Signed)
Form picked up

## 2017-03-17 ENCOUNTER — Telehealth: Payer: Self-pay | Admitting: Family Medicine

## 2017-03-17 NOTE — Telephone Encounter (Signed)
Patient received a jury duty notification for 8/7 but does not think she is physically able to go with the dizziness issue she has.    [phone]  Thank You

## 2017-03-17 NOTE — Telephone Encounter (Signed)
Pt states she does not feel like that she can sit for jury duty due to dizziness. Please advise.

## 2017-03-18 ENCOUNTER — Encounter: Payer: Self-pay | Admitting: Family Medicine

## 2017-03-18 NOTE — Telephone Encounter (Signed)
Letter written. OK for her to come pick up.

## 2017-03-18 NOTE — Telephone Encounter (Signed)
Pt aware. Letter printed at front desk.

## 2017-03-18 NOTE — Telephone Encounter (Signed)
Letter picked up.

## 2017-04-13 ENCOUNTER — Telehealth: Payer: Self-pay | Admitting: Family Medicine

## 2017-04-13 NOTE — Telephone Encounter (Signed)
Patient would like to see if there are any trulicity samples in stock.  Please Advise.  Thank you

## 2017-04-13 NOTE — Telephone Encounter (Signed)
Patient notified that we have 2 samples of Trulicity.

## 2017-05-25 ENCOUNTER — Encounter: Payer: Self-pay | Admitting: Family Medicine

## 2017-05-25 ENCOUNTER — Ambulatory Visit (INDEPENDENT_AMBULATORY_CARE_PROVIDER_SITE_OTHER): Payer: Medicare Other | Admitting: Family Medicine

## 2017-05-25 VITALS — BP 144/84 | HR 82 | Temp 98.8°F | Ht 64.7 in | Wt 194.4 lb

## 2017-05-25 DIAGNOSIS — N183 Chronic kidney disease, stage 3 unspecified: Secondary | ICD-10-CM

## 2017-05-25 DIAGNOSIS — I200 Unstable angina: Secondary | ICD-10-CM | POA: Diagnosis not present

## 2017-05-25 DIAGNOSIS — I129 Hypertensive chronic kidney disease with stage 1 through stage 4 chronic kidney disease, or unspecified chronic kidney disease: Secondary | ICD-10-CM

## 2017-05-25 DIAGNOSIS — F321 Major depressive disorder, single episode, moderate: Secondary | ICD-10-CM

## 2017-05-25 DIAGNOSIS — E1122 Type 2 diabetes mellitus with diabetic chronic kidney disease: Secondary | ICD-10-CM

## 2017-05-25 DIAGNOSIS — R42 Dizziness and giddiness: Secondary | ICD-10-CM | POA: Diagnosis not present

## 2017-05-25 DIAGNOSIS — J449 Chronic obstructive pulmonary disease, unspecified: Secondary | ICD-10-CM

## 2017-05-25 DIAGNOSIS — R0602 Shortness of breath: Secondary | ICD-10-CM

## 2017-05-25 DIAGNOSIS — E782 Mixed hyperlipidemia: Secondary | ICD-10-CM

## 2017-05-25 DIAGNOSIS — Z23 Encounter for immunization: Secondary | ICD-10-CM

## 2017-05-25 DIAGNOSIS — E876 Hypokalemia: Secondary | ICD-10-CM | POA: Diagnosis not present

## 2017-05-25 MED ORDER — ALBUTEROL SULFATE (2.5 MG/3ML) 0.083% IN NEBU: 2.5000 mg | INHALATION_SOLUTION | Freq: Four times a day (QID) | RESPIRATORY_TRACT | 1 refills | Status: DC | PRN

## 2017-05-25 MED ORDER — ISOSORBIDE MONONITRATE ER 60 MG PO TB24: 60.0000 mg | ORAL_TABLET | Freq: Every day | ORAL | 1 refills | Status: DC

## 2017-05-25 MED ORDER — FUROSEMIDE 40 MG PO TABS: 40.0000 mg | ORAL_TABLET | Freq: Every day | ORAL | 1 refills | Status: DC

## 2017-05-25 MED ORDER — ATORVASTATIN CALCIUM 40 MG PO TABS: 40.0000 mg | ORAL_TABLET | Freq: Every day | ORAL | 1 refills | Status: DC

## 2017-05-25 MED ORDER — TIOTROPIUM BROMIDE MONOHYDRATE 18 MCG IN CAPS: 18.0000 ug | ORAL_CAPSULE | Freq: Every day | RESPIRATORY_TRACT | 3 refills | Status: DC

## 2017-05-25 MED ORDER — ALBUTEROL SULFATE HFA 108 (90 BASE) MCG/ACT IN AERS: 1.0000 | INHALATION_SPRAY | RESPIRATORY_TRACT | 3 refills | Status: DC | PRN

## 2017-05-25 MED ORDER — SERTRALINE HCL 50 MG PO TABS: ORAL_TABLET | ORAL | 1 refills | Status: DC

## 2017-05-25 MED ORDER — DULAGLUTIDE 1.5 MG/0.5ML ~~LOC~~ SOAJ: 1.5000 mg | SUBCUTANEOUS | 6 refills | Status: DC

## 2017-05-25 MED ORDER — CARVEDILOL 12.5 MG PO TABS: ORAL_TABLET | ORAL | 1 refills | Status: DC

## 2017-05-25 MED ORDER — BUDESONIDE-FORMOTEROL FUMARATE 160-4.5 MCG/ACT IN AERO: 2.0000 | INHALATION_SPRAY | Freq: Two times a day (BID) | RESPIRATORY_TRACT | 4 refills | Status: DC

## 2017-05-25 MED ORDER — NITROGLYCERIN 0.4 MG SL SUBL: 0.4000 mg | SUBLINGUAL_TABLET | SUBLINGUAL | 12 refills | Status: DC | PRN

## 2017-05-25 NOTE — Assessment & Plan Note (Addendum)
Stable. Continue current regimen. Continue to monitor. Call with any concerns.  

## 2017-05-25 NOTE — Progress Notes (Signed)
BP (!) 144/84 (BP Location: Left Arm, Patient Position: Sitting, Cuff Size: Large)   Pulse 82   Temp 98.8 F (37.1 C)   Ht 5' 4.7" (1.643 m)   Wt 194 lb 6 oz (88.2 kg)   SpO2 93%   BMI 32.65 kg/m    Subjective:    [name], female    DOB: Aug 02, 1946, 71 y.o.   MRN: [id number]  HPI: Sarah White is a 71 y.o. female  Chief Complaint  Patient presents with  . Medicare Wellness  . Hyperlipidemia  . Hypertension   Dizziness no better- still really frustrated  HYPERTENSION / HYPERLIPIDEMIA Satisfied with current treatment? yes Duration of hypertension: chronic BP monitoring frequency: not checking BP medication side effects: no Past BP meds: carvedilol, imdur Duration of hyperlipidemia: chronic Cholesterol medication side effects: no Cholesterol supplements: none Past cholesterol medications: atorvastain (lipitor) Medication compliance: good compliance Aspirin: yes Recent stressors: no Recurrent headaches: no Visual changes: no Palpitations: no Dyspnea: no Chest pain: no Lower extremity edema: no Dizzy/lightheaded: yes  DIABETES Hypoglycemic episodes:no Polydipsia/polyuria: no Visual disturbance: no Chest pain: no Paresthesias: no Glucose Monitoring: yes  Accucheck frequency: BID Taking Insulin?: no Blood Pressure Monitoring: not checking Retinal Examination: Not up to Date Foot Exam: Up to Date Diabetic Education: Completed Pneumovax: Up to Date Influenza: Up to Date Aspirin: yes  DEPRESSION Mood status: better Satisfied with current treatment?: yes Symptom severity: mild  Duration of current treatment : chronic Side effects: no Medication compliance: excellent compliance Psychotherapy/counseling: no  Previous psychiatric medications: zoloft Depressed mood: no Anxious mood: no Anhedonia: no Significant weight loss or gain: no Insomnia: no  Fatigue: no Feelings of worthlessness or guilt: no Impaired  concentration/indecisiveness: no Suicidal ideations: no Hopelessness: no Crying spells: no Depression screen [facility] 2/9 01/01/2017 12/01/2016 09/15/2016 07/29/2015  Decreased Interest 0 3 0 0  Down, Depressed, Hopeless 0 3 0 0  PHQ - 2 Score 0 6 0 0  Altered sleeping 0 2 - -  Tired, decreased energy 2 3 - -  Change in appetite 0 2 - -  Feeling bad or failure about yourself  0 2 - -  Trouble concentrating 1 2 - -  Moving slowly or fidgety/restless 0 0 - -  Suicidal thoughts 0 0 - -  PHQ-9 Score 3 17 - -   Relevant past medical, surgical, family and social history reviewed and updated as indicated. Interim medical history since our last visit reviewed. Allergies and medications reviewed and updated.  Review of Systems  Constitutional: Negative.   Respiratory: Negative.   Cardiovascular: Negative.   Psychiatric/Behavioral: Negative.     Per HPI unless specifically indicated above     Objective:    BP (!) 144/84 (BP Location: Left Arm, Patient Position: Sitting, Cuff Size: Large)   Pulse 82   Temp 98.8 F (37.1 C)   Ht 5' 4.7" (1.643 m)   Wt 194 lb 6 oz (88.2 kg)   SpO2 93%   BMI 32.65 kg/m   Wt Readings from Last 3 Encounters:  05/25/17 194 lb 6 oz (88.2 kg)  02/22/17 195 lb 5 oz (88.6 kg)  01/01/17 198 lb (89.8 kg)    Physical Exam  Constitutional: She is oriented to person, place, and time. She appears well-developed and well-nourished. No distress.  HENT:  Head: Normocephalic and atraumatic.  Right Ear: Hearing normal.  Left Ear: Hearing normal.  Nose: Nose normal.  Eyes: Conjunctivae and lids are normal. Right eye  exhibits no discharge. Left eye exhibits no discharge. No scleral icterus.  Cardiovascular: Normal rate, regular rhythm, normal heart sounds and intact distal pulses.  Exam reveals no gallop and no friction rub.   No murmur heard. Pulmonary/Chest: Effort normal and breath sounds normal. No respiratory distress. She has no wheezes. She has no rales. She  exhibits no tenderness.  Musculoskeletal: Normal range of motion.  Neurological: She is alert and oriented to person, place, and time.  Skin: Skin is warm, dry and intact. No rash noted. She is not diaphoretic. No erythema. No pallor.  Psychiatric: She has a normal mood and affect. Her speech is normal and behavior is normal. Judgment and thought content normal. Cognition and memory are normal.  Nursing note and vitals reviewed.   Results for orders placed or performed in visit on 02/22/17  Bayer DCA Hb A1c Waived  Result Value Ref Range   Bayer DCA Hb A1c Waived 7.2 (H) <7.0 %      Assessment & Plan:   Problem List Items Addressed This Visit      Respiratory   COPD (chronic obstructive pulmonary disease) ([place]) - Primary    Stable. Lungs clear. Continue current regimen. Continue to monitor. Call with any concerns.       Relevant Medications   albuterol (PROVENTIL) (2.5 MG/3ML) 0.083% nebulizer solution   budesonide-formoterol (SYMBICORT) 160-4.5 MCG/ACT inhaler   tiotropium (SPIRIVA HANDIHALER) 18 MCG inhalation capsule   albuterol (PROAIR HFA) 108 (90 Base) MCG/ACT inhaler   Other Relevant Orders   CBC with Differential/Platelet   Comprehensive metabolic panel   UA/M w/rflx Culture, Routine     Endocrine   Diabetes mellitus with chronic kidney disease ([place])    Under good control with A1c of 6.3! Continue current regimen. Continue to monitor. Call with any concerns.       Relevant Medications   atorvastatin (LIPITOR) 40 MG tablet   Dulaglutide (TRULICITY) 1.5 SE/8.3TD SOPN   Other Relevant Orders   Bayer DCA Hb A1c Waived   CBC with Differential/Platelet   Comprehensive metabolic panel   Microalbumin, Urine Waived   UA/M w/rflx Culture, Routine     Genitourinary   Hypertensive renal disease    Stable. Continue current regimen. Continue to monitor. Call with any concerns.       Relevant Orders   CBC with Differential/Platelet   Comprehensive metabolic panel    Microalbumin, Urine Waived   UA/M w/rflx Culture, Routine     Other   Hyperlipidemia    Stable. Continue current regimen. Continue to monitor. Call with any concerns.       Relevant Medications   atorvastatin (LIPITOR) 40 MG tablet   carvedilol (COREG) 12.5 MG tablet   furosemide (LASIX) 40 MG tablet   nitroGLYCERIN (NITROSTAT) 0.4 MG SL tablet   isosorbide mononitrate (IMDUR) 60 MG 24 hr tablet   Other Relevant Orders   CBC with Differential/Platelet   Comprehensive metabolic panel   Lipid Panel w/o Chol/HDL Ratio   UA/M w/rflx Culture, Routine   Hypokalemia    Stable. Continue current regimen. Continue to monitor. Call with any concerns.       Relevant Orders   CBC with Differential/Platelet   Comprehensive metabolic panel   UA/M w/rflx Culture, Routine   Depression, major, single episode, moderate ([facility])    Stable. Continue current regimen. Continue to monitor. Call with any concerns.       Relevant Medications   sertraline (ZOLOFT) 50 MG tablet   Other Relevant Orders  CBC with Differential/Platelet   Comprehensive metabolic panel   TSH   UA/M w/rflx Culture, Routine    Other Visit Diagnoses    Immunization due       Flu shot given today   Relevant Orders   Flu vaccine HIGH DOSE PF (Fluzone High dose) (Completed)   Dizziness       Has had full cardiac work up. Still feeling dizzy- will send to ENT to see if this is contributing to her symptoms.    Relevant Orders   Ambulatory referral to ENT   Shortness of breath       Relevant Medications   albuterol (PROVENTIL) (2.5 MG/3ML) 0.083% nebulizer solution       Follow up plan: Return in about 3 months (around 08/24/2017) for DM follow up.

## 2017-05-25 NOTE — Assessment & Plan Note (Signed)
Under good control with A1c of 6.3. Continue current regimen. Continue to monitor. Call with any concerns.  

## 2017-05-25 NOTE — Assessment & Plan Note (Signed)
Stable. Continue current regimen. Continue to monitor. Call with any concerns.  

## 2017-05-25 NOTE — Assessment & Plan Note (Signed)
Stable. Lungs clear. Continue current regimen. Continue to monitor. Call with any concerns.

## 2017-05-25 NOTE — Patient Instructions (Signed)

## 2017-05-26 LAB — UA/M W/RFLX CULTURE, ROUTINE
Bilirubin, UA: NEGATIVE
Glucose, UA: NEGATIVE
Ketones, UA: NEGATIVE
Leukocytes, UA: NEGATIVE
NITRITE UA: NEGATIVE
PH UA: 6 (ref 5.0–7.5)
Protein, UA: NEGATIVE
RBC, UA: NEGATIVE
Specific Gravity, UA: 1.01 (ref 1.005–1.030)
UUROB: 0.2 mg/dL (ref 0.2–1.0)

## 2017-05-26 LAB — MICROALBUMIN, URINE WAIVED
Creatinine, Urine Waived: 50 mg/dL (ref 10–300)
MICROALB, UR WAIVED: 10 mg/L (ref 0–19)
Microalb/Creat Ratio: <30 mg/g (ref <30)

## 2017-05-26 LAB — COMPREHENSIVE METABOLIC PANEL
ALBUMIN: 4.2 g/dL (ref 3.6–4.8)
ALK PHOS: 132 IU/L — AB (ref 39–117)
ALT: 22 IU/L (ref 0–32)
AST: 16 IU/L (ref 0–40)
Albumin/Globulin Ratio: 1.7 (ref 1.2–2.2)
BUN / CREAT RATIO: 14 (ref 12–28)
BUN: 10 mg/dL (ref 8–27)
Bilirubin Total: 0.4 mg/dL (ref 0.0–1.2)
CALCIUM: 9.1 mg/dL (ref 8.7–10.3)
CO2: 24 mmol/L (ref 20–29)
CREATININE: 0.71 mg/dL (ref 0.57–1.00)
Chloride: 101 mmol/L (ref 96–106)
GFR calc Af Amer: 101 mL/min/{1.73_m2} (ref >59)
GFR, EST NON AFRICAN AMERICAN: 88 mL/min/{1.73_m2} (ref >59)
GLUCOSE: 74 mg/dL (ref 65–99)
Globulin, Total: 2.5 g/dL (ref 1.5–4.5)
Potassium: 4.3 mmol/L (ref 3.5–5.2)
Sodium: 142 mmol/L (ref 134–144)
Total Protein: 6.7 g/dL (ref 6.0–8.5)

## 2017-05-26 LAB — CBC WITH DIFFERENTIAL/PLATELET
BASOS ABS: 0 10*3/uL (ref 0.0–0.2)
Basos: 0 %
EOS (ABSOLUTE): 0.1 10*3/uL (ref 0.0–0.4)
EOS: 1 %
HEMATOCRIT: 46.3 % (ref 34.0–46.6)
HEMOGLOBIN: 15.7 g/dL (ref 11.1–15.9)
IMMATURE GRANS (ABS): 0 10*3/uL (ref 0.0–0.1)
IMMATURE GRANULOCYTES: 0 %
LYMPHS ABS: 1.7 10*3/uL (ref 0.7–3.1)
Lymphs: 20 %
MCH: 28.1 pg (ref 26.6–33.0)
MCHC: 33.9 g/dL (ref 31.5–35.7)
MCV: 83 fL (ref 79–97)
Monocytes Absolute: 0.6 10*3/uL (ref 0.1–0.9)
Monocytes: 7 %
NEUTROS ABS: 6.3 10*3/uL (ref 1.4–7.0)
Neutrophils: 72 %
Platelets: 194 10*3/uL (ref 150–379)
RBC: 5.58 x10E6/uL — AB (ref 3.77–5.28)
RDW: 16.2 % — ABNORMAL HIGH (ref 12.3–15.4)
WBC: 8.8 10*3/uL (ref 3.4–10.8)

## 2017-05-26 LAB — LIPID PANEL W/O CHOL/HDL RATIO
CHOLESTEROL TOTAL: 188 mg/dL (ref 100–199)
HDL: 40 mg/dL (ref >39)
LDL CALC: 111 mg/dL — AB (ref 0–99)
TRIGLYCERIDES: 186 mg/dL — AB (ref 0–149)
VLDL CHOLESTEROL CAL: 37 mg/dL (ref 5–40)

## 2017-05-26 LAB — MICROSCOPIC EXAMINATION
Bacteria, UA: NONE SEEN
RBC MICROSCOPIC, UA: NONE SEEN /HPF (ref 0–?)

## 2017-05-26 LAB — BAYER DCA HB A1C WAIVED: HB A1C (BAYER DCA - WAIVED): 6.3 % (ref <7.0)

## 2017-05-26 LAB — TSH: TSH: 1.59 u[IU]/mL (ref 0.450–4.500)

## 2017-06-12 IMAGING — DX DG CHEST 1V
1 series · 1 of 1 positions shown · non-contrast
Comparison: 10/16/2015

CLINICAL DATA: Two day history of dizziness.

EXAM:
CHEST 1 VIEW

[chest ap]
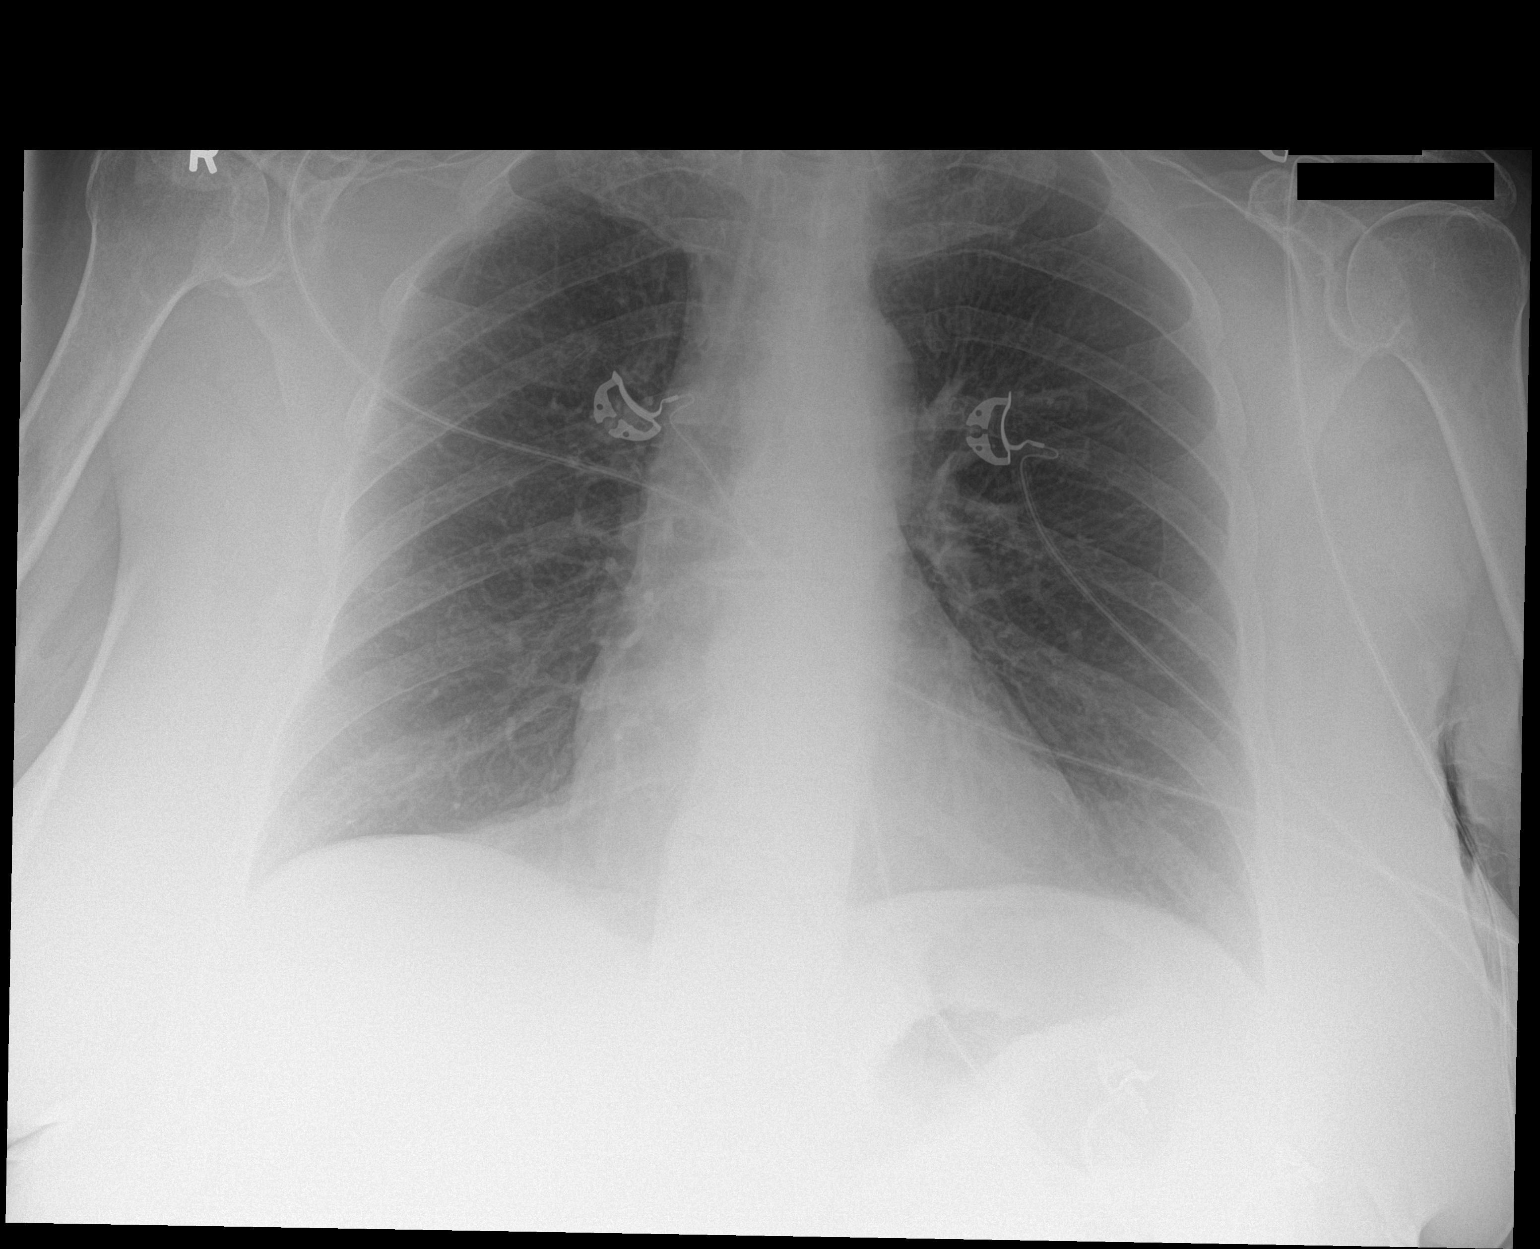

[1 of 1 positions shown; findings below may reference images not displayed]

FINDINGS: The heart size and mediastinal contours are within normal limits.
Both lungs are clear. The visualized skeletal structures are
unremarkable.
IMPRESSION: Normal chest x-ray.

## 2017-06-22 ENCOUNTER — Telehealth: Payer: Self-pay

## 2017-06-22 NOTE — Telephone Encounter (Signed)
Copied from [place] #[id number]. Topic: Inquiry >> Jun 21, 2017  1:45 PM Aurelio Brash B wrote: Reason for CRM: Pt asking is practice has inhaler and trulicity samples.    Patient notified that we do have trulicity samples, but we do not have Symbicort samples.  Patient will come by the office and pick them up

## 2017-07-21 ENCOUNTER — Ambulatory Visit: Payer: Self-pay

## 2017-07-21 NOTE — Telephone Encounter (Signed)
Pt calling "wanting something called in." Pt stated that she began coughing yesterday and NT can hear pt wheezing over the phone. Pt stated she last used her inhaler 2 hours ago. Pt states she has COPD. Asked pt to check her O2 sat. O2 sat 92% and HR 80. Pt states she develops pneumonia 2-3 times per year.  Pt advised to go to [facility] asap.   Reason for Disposition . Wheezing is present  Answer Assessment - Initial Assessment Questions 1. ONSET: "When did the cough begin?"      Yesterday afternoon 2. SEVERITY: "How bad is the cough today?"      Coughing when talking and intermittenly 3. RESPIRATORY DISTRESS: "Describe your breathing."      Lungs sound congested and productive cough 4. FEVER: "Do you have a fever?" If so, ask: "What is your temperature, how was it measured, and when did it start?"     no 5. SPUTUM: "Describe the color of your sputum" (clear, white, yellow, green)     clear 6. HEMOPTYSIS: "Are you coughing up any blood?" If so ask: "How much?" (flecks, streaks, tablespoons, etc.)     no 7. CARDIAC HISTORY: "Do you have any history of heart disease?" (e.g., heart attack, congestive heart failure)      Heart attack 2003 / CHF 2 years ago  8. LUNG HISTORY: "Do you have any history of lung disease?"  (e.g., pulmonary embolus, asthma, emphysema)     COPD  9. PE RISK FACTORS: "Do you have a history of blood clots?" (or: recent major surgery, recent prolonged travel, bedridden ) no 10. OTHER SYMPTOMS: "Do you have any other symptoms?" (e.g., runny nose, wheezing, chest pain)       Runny nose wheezing chest pain  11. PREGNANCY: "Is there any chance you are pregnant?" "When was your last menstrual period?"       n/a 12. TRAVEL: "Have you traveled out of the country in the last month?" (e.g., travel history, exposures)       n/a  Protocols used: [place]

## 2017-07-26 ENCOUNTER — Encounter: Payer: Self-pay | Admitting: Family Medicine

## 2017-07-26 ENCOUNTER — Ambulatory Visit (INDEPENDENT_AMBULATORY_CARE_PROVIDER_SITE_OTHER): Payer: Medicare Other | Admitting: Family Medicine

## 2017-07-26 VITALS — BP 190/82 | HR 70 | Temp 97.8°F | Wt 199.8 lb

## 2017-07-26 DIAGNOSIS — J449 Chronic obstructive pulmonary disease, unspecified: Secondary | ICD-10-CM

## 2017-07-26 DIAGNOSIS — I200 Unstable angina: Secondary | ICD-10-CM | POA: Diagnosis not present

## 2017-07-26 MED ORDER — AZITHROMYCIN 250 MG PO TABS: ORAL_TABLET | ORAL | 0 refills | Status: DC

## 2017-07-26 MED ORDER — PREDNISONE 10 MG PO TABS: ORAL_TABLET | ORAL | 0 refills | Status: DC

## 2017-07-26 NOTE — Progress Notes (Signed)
BP (!) 190/82 (BP Location: Left Arm, Patient Position: Sitting, Cuff Size: Normal)   Pulse 70   Temp 97.8 F (36.6 C) (Oral)   Wt 199 lb 12.8 oz (90.6 kg)   SpO2 91%   BMI 33.56 kg/m    Subjective:    [name], female    DOB: September 04, 1945, 71 y.o.   MRN: [id number]  HPI: Sarah White is a 71 y.o. female  Chief Complaint  Patient presents with  . Cough    x's 3 days. Productive. When patient coughs she loses her breath.   . Shortness of Breath   Left sided facial pain and congestion, runny eye, fatigue, wheezing, significant SOB, productive cough, and fatigue x 3 days. Denies fevers, chills, body aches, CP. Has significant breathlessness with exertion and after coughing spells. Using her albuterol inhaler many times daily since onset with minimal temporary relief. Has also been using leftover tessalon perles which work well for her. Pt has not been taking symbicort or spiriva due to cost for several months. Also notes she has a box of nebulizer solution at home but has never had a nebulizer machine, wanting to know if she can get a machine. Is a current smoker with COPD.   Relevant past medical, surgical, family and social history reviewed and updated as indicated. Interim medical history since our last visit reviewed. Allergies and medications reviewed and updated.  Review of Systems  Constitutional: Positive for fatigue.  HENT: Positive for congestion, sinus pressure and sinus pain.   Eyes: Positive for discharge.  Respiratory: Positive for cough, shortness of breath and wheezing.   Cardiovascular: Negative.   Gastrointestinal: Negative.   Genitourinary: Negative.   Musculoskeletal: Negative.   Skin: Negative.   Neurological: Negative.   Psychiatric/Behavioral: Negative.    Per HPI unless specifically indicated above     Objective:    BP (!) 190/82 (BP Location: Left Arm, Patient Position: Sitting, Cuff Size: Normal)   Pulse 70   Temp 97.8 F (36.6  C) (Oral)   Wt 199 lb 12.8 oz (90.6 kg)   SpO2 91%   BMI 33.56 kg/m   Wt Readings from Last 3 Encounters:  07/26/17 199 lb 12.8 oz (90.6 kg)  05/25/17 194 lb 6 oz (88.2 kg)  02/22/17 195 lb 5 oz (88.6 kg)    Physical Exam  Constitutional: She is oriented to person, place, and time. She appears well-developed and well-nourished. No distress.  HENT:  Head: Atraumatic.  Eyes: Conjunctivae are normal. Pupils are equal, round, and reactive to light. No scleral icterus.  Neck: Normal range of motion. Neck supple.  Cardiovascular: Normal rate and normal heart sounds.  Pulmonary/Chest: Effort normal. No respiratory distress. She has wheezes (moderate improvement post-nebulizer treatment). She has no rales.  Musculoskeletal: Normal range of motion.  Neurological: She is alert and oriented to person, place, and time.  Skin: Skin is warm and dry.  Psychiatric: She has a normal mood and affect. Her behavior is normal.  Nursing note and vitals reviewed.     Assessment & Plan:   Problem List Items Addressed This Visit      Respiratory   COPD (chronic obstructive pulmonary disease) ([place]) - Primary    O2 Saturation improved from 91% on intake to 99% post-nebulizer treatment with good symptomatic relief per patient. Samples given of spiriva and symbicort to help with her medication cost/compliance. Script written for nebulizer machine so she can continue home treatments. Prednisone taper, zpack, continue  tessalon perles prn. F/u if worsening or no improvement      Relevant Medications   predniSONE (DELTASONE) 10 MG tablet   azithromycin (ZITHROMAX) 250 MG tablet       Follow up plan: Return for as scheduled.

## 2017-07-27 NOTE — Assessment & Plan Note (Signed)
O2 Saturation improved from 91% on intake to 99% post-nebulizer treatment with good symptomatic relief per patient. Samples given of spiriva and symbicort to help with her medication cost/compliance. Script written for nebulizer machine so she can continue home treatments. Prednisone taper, zpack, continue tessalon perles prn. F/u if worsening or no improvement

## 2017-07-27 NOTE — Patient Instructions (Signed)
Follow up as needed

## 2017-08-10 ENCOUNTER — Encounter: Payer: Self-pay | Admitting: Family Medicine

## 2017-08-19 ENCOUNTER — Emergency Department: Payer: Medicare Other

## 2017-08-19 ENCOUNTER — Other Ambulatory Visit: Payer: Self-pay

## 2017-08-19 ENCOUNTER — Ambulatory Visit: Payer: Self-pay | Admitting: *Deleted

## 2017-08-19 ENCOUNTER — Emergency Department
Admission: EM | Admit: 2017-08-19 | Discharge: 2017-08-19 | Disposition: A | Discharge status: Home / Self Care | Payer: Medicare Other | Attending: Emergency Medicine | Admitting: Emergency Medicine

## 2017-08-19 DIAGNOSIS — R3129 Other microscopic hematuria: Secondary | ICD-10-CM | POA: Diagnosis not present

## 2017-08-19 DIAGNOSIS — N200 Calculus of kidney: Secondary | ICD-10-CM

## 2017-08-19 DIAGNOSIS — I130 Hypertensive heart and chronic kidney disease with heart failure and stage 1 through stage 4 chronic kidney disease, or unspecified chronic kidney disease: Secondary | ICD-10-CM | POA: Insufficient documentation

## 2017-08-19 DIAGNOSIS — I5020 Unspecified systolic (congestive) heart failure: Secondary | ICD-10-CM | POA: Insufficient documentation

## 2017-08-19 DIAGNOSIS — Z7982 Long term (current) use of aspirin: Secondary | ICD-10-CM | POA: Insufficient documentation

## 2017-08-19 DIAGNOSIS — F17210 Nicotine dependence, cigarettes, uncomplicated: Secondary | ICD-10-CM | POA: Insufficient documentation

## 2017-08-19 DIAGNOSIS — R1110 Vomiting, unspecified: Secondary | ICD-10-CM | POA: Diagnosis not present

## 2017-08-19 DIAGNOSIS — Z7984 Long term (current) use of oral hypoglycemic drugs: Secondary | ICD-10-CM | POA: Insufficient documentation

## 2017-08-19 DIAGNOSIS — N131 Hydronephrosis with ureteral stricture, not elsewhere classified: Secondary | ICD-10-CM | POA: Diagnosis not present

## 2017-08-19 DIAGNOSIS — Z79899 Other long term (current) drug therapy: Secondary | ICD-10-CM | POA: Diagnosis not present

## 2017-08-19 DIAGNOSIS — E1122 Type 2 diabetes mellitus with diabetic chronic kidney disease: Secondary | ICD-10-CM | POA: Insufficient documentation

## 2017-08-19 DIAGNOSIS — J449 Chronic obstructive pulmonary disease, unspecified: Secondary | ICD-10-CM | POA: Diagnosis not present

## 2017-08-19 DIAGNOSIS — I2510 Atherosclerotic heart disease of native coronary artery without angina pectoris: Secondary | ICD-10-CM | POA: Insufficient documentation

## 2017-08-19 DIAGNOSIS — M549 Dorsalgia, unspecified: Secondary | ICD-10-CM | POA: Diagnosis present

## 2017-08-19 DIAGNOSIS — N189 Chronic kidney disease, unspecified: Secondary | ICD-10-CM | POA: Insufficient documentation

## 2017-08-19 HISTORY — DX: Essential (primary) hypertension: I10

## 2017-08-19 LAB — CBC
HCT: 47.9 % — ABNORMAL HIGH (ref 35.0–47.0)
Hemoglobin: 15.3 g/dL (ref 12.0–16.0)
MCH: 27.3 pg (ref 26.0–34.0)
MCHC: 32 g/dL (ref 32.0–36.0)
MCV: 85.1 fL (ref 80.0–100.0)
PLATELETS: 151 10*3/uL (ref 150–440)
RBC: 5.63 MIL/uL — AB (ref 3.80–5.20)
RDW: 14.5 % (ref 11.5–14.5)
WBC: 11.2 10*3/uL — AB (ref 3.6–11.0)

## 2017-08-19 LAB — COMPREHENSIVE METABOLIC PANEL
ALK PHOS: 141 U/L — AB (ref 38–126)
ALT: 21 U/L (ref 14–54)
ANION GAP: 10 (ref 5–15)
AST: 25 U/L (ref 15–41)
Albumin: 3.6 g/dL (ref 3.5–5.0)
BUN: 12 mg/dL (ref 6–20)
CALCIUM: 9.2 mg/dL (ref 8.9–10.3)
CHLORIDE: 102 mmol/L (ref 101–111)
CO2: 26 mmol/L (ref 22–32)
CREATININE: 0.83 mg/dL (ref 0.44–1.00)
Glucose, Bld: 356 mg/dL — ABNORMAL HIGH (ref 65–99)
Potassium: 4.4 mmol/L (ref 3.5–5.1)
Sodium: 138 mmol/L (ref 135–145)
Total Bilirubin: 0.5 mg/dL (ref 0.3–1.2)
Total Protein: 6.7 g/dL (ref 6.5–8.1)

## 2017-08-19 LAB — URINALYSIS, ROUTINE W REFLEX MICROSCOPIC
Bacteria, UA: NONE SEEN
Bilirubin Urine: NEGATIVE
Ketones, ur: NEGATIVE mg/dL
NITRITE: NEGATIVE
PH: 6 (ref 5.0–8.0)
Protein, ur: 30 mg/dL — AB
SPECIFIC GRAVITY, URINE: 1.012 (ref 1.005–1.030)

## 2017-08-19 LAB — LIPASE, BLOOD: LIPASE: 41 U/L (ref 11–51)

## 2017-08-19 MED ORDER — HYDROCODONE-ACETAMINOPHEN 5-325 MG PO TABS: 1.0000 | ORAL_TABLET | Freq: Four times a day (QID) | ORAL | 0 refills | Status: DC | PRN

## 2017-08-19 MED ORDER — IBUPROFEN 800 MG PO TABS: 800.0000 mg | ORAL_TABLET | Freq: Three times a day (TID) | ORAL | 0 refills | Status: DC | PRN

## 2017-08-19 MED ORDER — ONDANSETRON 4 MG PO TBDP
4.0000 mg | ORAL_TABLET | Freq: Once | ORAL | Status: AC
  Administered 2017-08-19: 4 mg via ORAL
  Filled 2017-08-19: qty 1

## 2017-08-19 MED ORDER — IPRATROPIUM-ALBUTEROL 0.5-2.5 (3) MG/3ML IN SOLN
3.0000 mL | Freq: Once | RESPIRATORY_TRACT | Status: AC
  Administered 2017-08-19: 3 mL via RESPIRATORY_TRACT
  Filled 2017-08-19: qty 3

## 2017-08-19 MED ORDER — IBUPROFEN 600 MG PO TABS
600.0000 mg | ORAL_TABLET | Freq: Once | ORAL | Status: AC
  Administered 2017-08-19: 600 mg via ORAL
  Filled 2017-08-19: qty 1

## 2017-08-19 MED ORDER — TAMSULOSIN HCL 0.4 MG PO CAPS: 0.4000 mg | ORAL_CAPSULE | Freq: Every day | ORAL | 0 refills | Status: DC

## 2017-08-19 MED ORDER — TAMSULOSIN HCL 0.4 MG PO CAPS
ORAL_CAPSULE | ORAL | Status: AC
  Administered 2017-08-19: 0.4 mg via ORAL
  Filled 2017-08-19: qty 1

## 2017-08-19 MED ORDER — ONDANSETRON HCL 4 MG PO TABS: 4.0000 mg | ORAL_TABLET | Freq: Three times a day (TID) | ORAL | 0 refills | Status: DC | PRN

## 2017-08-19 MED ORDER — TAMSULOSIN HCL 0.4 MG PO CAPS
0.4000 mg | ORAL_CAPSULE | Freq: Every day | ORAL | Status: DC
  Administered 2017-08-19: 0.4 mg via ORAL

## 2017-08-19 NOTE — ED Notes (Signed)
Patient transported to CT 

## 2017-08-19 NOTE — Telephone Encounter (Signed)
Called in c/o her left side hurting real bad and vomiting that started about 8:00 this morning.   "It started with a back ache that went into my left side and around to the front."   I'm sweating real bad and the pain is just constant and bad.   It won't let up.    I instructed her to go to the ED.   She said her son was there and could take her now.   I routed a note to Dr. Jinny Blossom Johnson's nurse pool letting them know she is being referred to the ED at [facility].  Reason for Disposition . [1] SEVERE pain AND [2] age > 2  Answer Assessment - Initial Assessment Questions 1. LOCATION: "Where does it hurt?"      Left side of my abd below the wait is hurting bad.   It started this morning with a back ache.  I'm vomiting which started this morning.   No diarrhea but I'm sweating bad. 2. RADIATION: "Does the pain shoot anywhere else?" (e.g., chest, back)     Staying right in my left side towards the front 3. ONSET: "When did the pain begin?" (e.g., minutes, hours or days ago)      This morning.   My back started hurting is the reason I got up. 4. SUDDEN: "Gradual or sudden onset?"     Gradually got worse from a backache then to my side. 5. PATTERN "Does the pain come and go, or is it constant?"    - If constant: "Is it getting better, staying the same, or worsening?"      (Note: Constant means the pain never goes away completely; most serious pain is constant and it progresses)     - If intermittent: "How long does it last?" "Do you have pain now?"     (Note: Intermittent means the pain goes away completely between bouts)     Constant. 6. SEVERITY: "How bad is the pain?"  (e.g., Scale 1-10; mild, moderate, or severe)   - MILD (1-3): doesn't interfere with normal activities, abdomen soft and not tender to touch    - MODERATE (4-7): interferes with normal activities or awakens from sleep, tender to touch    - SEVERE (8-10): excruciating pain, doubled over, unable to do any normal activities      9 on  pain scale. 7. RECURRENT SYMPTOM: "Have you ever had this type of abdominal pain before?" If so, ask: "When was the last time?" and "What happened that time?"      Not constipated.   This has never happened to me before. 8. CAUSE: "What do you think is causing the abdominal pain?"     I have no idea at all. 9. RELIEVING/AGGRAVATING FACTORS: "What makes it better or worse?" (e.g., movement, antacids, bowel movement)     It don't let up no matter how I move. 10. OTHER SYMPTOMS: "Has there been any vomiting, diarrhea, constipation, or urine problems?"       Just vomiting. 11. PREGNANCY: "Is there any chance you are pregnant?" "When was your last menstrual period?"       Not asked due to age  Protocols used: ABDOMINAL PAIN - [facility]

## 2017-08-19 NOTE — ED Notes (Signed)
Pt c/o waking around 4am with lower back pain radiates around to LLQ with N/V earlier about 5x today. States her urine was dark today. Denies hx of kidney stones states her pain is currently 6/10 denies any nausea at this time,.

## 2017-08-19 NOTE — ED Provider Notes (Signed)
[facility] Emergency Department Provider Note ____________________________________________   I have reviewed the triage vital signs and the triage nursing note.  HISTORY  Chief Complaint Back Pain and Emesis   Historian Patient and son in the room  HPI Sarah White is a 71 y.o. female presents with mid back pain upon waking this morning, no known trauma or overuse injury.  Pain was severe and then started wrapping around the left side into the left side of the abdomen.  Pain was severe, but currently nearly gone.  States that she has to urinate all the time because she is on furosemide.  No new hematuria or dysuria.  She has a history of COPD and chronic wheezing.     Past Medical History:  Diagnosis Date  . Acute respiratory failure with hypoxia and hypercarbia ([place]) 07/22/2015  . Cancer ([place])    uterine  . CHF (congestive heart failure) ([place])   . COPD (chronic obstructive pulmonary disease) ([place])   . Diabetes mellitus without complication ([place])   . Encephalopathy acute 07/22/2015  . Hypertension   . Pneumonia    November 2016  . Pulmonary edema 07/22/2015  . 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 Squamous cell carcinoma of arm, right 01/07/2017  . Depression, major, single episode, moderate ([place]) 12/01/2016  . Unstable angina ([place]) 11/26/2016  . Near syncope 03/11/2016  . Hypokalemia 03/11/2016  . Orthostatic hypotension 08/12/2015  . Hyperlipidemia 07/29/2015  . Acute systolic CHF (congestive heart failure) ([place]) 07/25/2015  . Hypertensive renal disease 07/25/2015  . Low back pain 07/25/2015  . Smoker 07/22/2015  . COPD (chronic obstructive pulmonary disease) ([place]) 07/22/2015  . Diabetes mellitus with chronic kidney disease ([place]) 07/22/2015  . CAD (coronary artery disease) 07/22/2015    Past Surgical History:  Procedure Laterality Date  . ABDOMINAL HYSTERECTOMY     . CARPAL TUNNEL RELEASE Bilateral   . CESAREAN SECTION    . CORONARY ANGIOPLASTY WITH STENT PLACEMENT    . KNEE SURGERY Left   . LEFT HEART CATH AND CORONARY ANGIOGRAPHY Right 11/26/2016   Procedure: Left Heart Cath and Coronary Angiography;  Surgeon: Isaias Cowman, MD;  Location: [facility];  Service: Cardiovascular;  Laterality: Right;    Prior to Admission medications   Medication Sig Start Date End Date Taking? Authorizing Provider  acetaminophen (TYLENOL) 500 MG tablet Take 500 mg by mouth every 6 (six) hours as needed for mild pain, fever or headache.    Yes [provider]  albuterol (PROAIR HFA) 108 (90 Base) MCG/ACT inhaler Inhale 1-2 puffs into the lungs every 4 (four) hours as needed for wheezing or shortness of breath. 05/25/17  Yes Johnson, [name], DO  albuterol (PROVENTIL) (2.5 MG/3ML) 0.083% nebulizer solution Take 3 mLs (2.5 mg total) by nebulization every 6 (six) hours as needed for wheezing or shortness of breath. 05/25/17  Yes Johnson, [name], DO  aspirin EC 81 MG tablet Take 81 mg by mouth daily.   Yes [provider]  atorvastatin (LIPITOR) 40 MG tablet Take 1 tablet (40 mg total) by mouth daily. 05/25/17  Yes Johnson, [name], DO  azithromycin (ZITHROMAX) 250 MG tablet Take 2 tabs day one, then 1 tab daily until complete 07/26/17  Yes Volney American, PA-C  budesonide-formoterol [facility]) 160-4.5 MCG/ACT inhaler Inhale 2 puffs into the lungs 2 (two) times daily. 05/25/17  Yes Wynetta Emery,  [name], DO  carvedilol (COREG) 12.5 MG tablet TAKE TWO TABLETS BY MOUTH TWICE DAILY WITH A MEAL 05/25/17  Yes Johnson, [name], DO  docusate sodium (COLACE) 100 MG capsule Take 100 mg by mouth daily.    Yes [provider]  doxylamine, Sleep, (UNISOM) 25 MG tablet Take 12.5 mg by mouth at bedtime as needed for sleep.    Yes [provider]  Dulaglutide (TRULICITY) 1.5 ZO/1.0RU SOPN Inject 1.5 mg into the skin once a week. 05/25/17  Yes  Johnson, [name], DO  furosemide (LASIX) 40 MG tablet Take 1 tablet (40 mg total) by mouth daily. 05/25/17  Yes Johnson, [name], DO  isosorbide mononitrate (IMDUR) 60 MG 24 hr tablet Take 1 tablet (60 mg total) by mouth daily. 05/25/17  Yes Johnson, [name], DO  meclizine (ANTIVERT) 12.5 MG tablet Take 1 tablet (12.5 mg total) by mouth 3 (three) times daily as needed for dizziness. 03/17/16  Yes Volney American, PA-C  Multiple Vitamin (MULTIVITAMIN WITH MINERALS) TABS tablet Take 1 tablet by mouth daily.   Yes [provider]  nitroGLYCERIN (NITROSTAT) 0.4 MG SL tablet Place 1 tablet (0.4 mg total) under the tongue every 5 (five) minutes as needed for chest pain. 05/25/17  Yes Johnson, [name], DO  ondansetron (ZOFRAN-ODT) 4 MG disintegrating tablet Take 4 mg by mouth every 8 (eight) hours as needed for nausea or vomiting.   Yes [provider]  predniSONE (DELTASONE) 10 MG tablet Take 6 tabs day one, 5 tabs day two, 4 tabs day three, etc 07/26/17  Yes Volney American, PA-C  sertraline (ZOLOFT) 50 MG tablet Take one pill daily 05/25/17  Yes Johnson, [name], DO  tiotropium (SPIRIVA HANDIHALER) 18 MCG inhalation capsule Place 1 capsule (18 mcg total) into inhaler and inhale daily. 05/25/17  Yes Johnson, [name], DO  HYDROcodone-acetaminophen (NORCO/VICODIN) 5-325 MG tablet Take 1 tablet by mouth every 6 (six) hours as needed for moderate pain. 08/19/17   Lisa Roca, MD  ibuprofen (ADVIL,MOTRIN) 800 MG tablet Take 1 tablet (800 mg total) by mouth every 8 (eight) hours as needed. 08/19/17   Lisa Roca, MD  ondansetron (ZOFRAN) 4 MG tablet Take 1 tablet (4 mg total) by mouth every 8 (eight) hours as needed for nausea or vomiting. 08/19/17   Lisa Roca, MD  tamsulosin (FLOMAX) 0.4 MG CAPS capsule Take 1 capsule (0.4 mg total) by mouth daily. 08/19/17   Lisa Roca, MD    Allergies  Allergen Reactions  . Amlodipine Swelling  . Codeine Nausea And Vomiting  . Hctz  [Hydrochlorothiazide] Other (See Comments)    Hypokalemia  . Lisinopril Other (See Comments)    Dizziness   . Metformin And Related Nausea Only    Nausea, dizziness    Family History  Problem Relation Age of Onset  . Heart disease Mother     Social History Social History   Tobacco Use  . Smoking status: Current Every Day Smoker    Packs/day: 0.50    Types: Cigarettes    Last attempt to quit: 07/17/2015    Years since quitting: 2.0  . Smokeless tobacco: Never Used  Substance Use Topics  . Alcohol use: No  . Drug use: No    Review of Systems  Constitutional: Negative for fever. Eyes: Negative for visual changes. ENT: Negative for sore throat. Cardiovascular: Negative for chest pain. Respiratory: Negative for shortness of breath. Gastrointestinal: Negative for vomiting and diarrhea. Genitourinary: Negative for dysuria, positive  for urinary frequency. Musculoskeletal: Positive for mid/right low back pain. Skin: Negative for rash. Neurological: Negative for headache.  ____________________________________________   PHYSICAL EXAM:  VITAL SIGNS: ED Triage Vitals [08/19/17 1148]  Enc Vitals Group     BP (!) 162/68     Pulse Rate 70     Resp 18     Temp 98 F (36.7 C)     Temp Source Oral     SpO2 95 %     Weight 195 lb (88.5 kg)     Height 5\' 4"  (1.626 m)     Head Circumference      Peak Flow      Pain Score 8     Pain Loc      Pain Edu?      Excl. in [place]?      Constitutional: Alert and oriented. Well appearing and in no distress. HEENT   Head: Normocephalic and atraumatic.      Eyes: Conjunctivae are normal. Pupils equal and round.       Ears:         Nose: No congestion/rhinnorhea.   Mouth/Throat: Mucous membranes are moist.   Neck: No stridor. Cardiovascular/Chest: Normal rate, regular rhythm.  No murmurs, rubs, or gallops. Respiratory: Normal respiratory effort without tachypnea nor retractions. Breath sounds are clear and equal  bilaterally.  Moderate wheezing especially with deep inspiration. Gastrointestinal: Soft. No distention, no guarding, no rebound. Nontender.  Genitourinary/rectal:Deferred Musculoskeletal: Mild low back midline pain.  Nontender with normal range of motion in all extremities. No joint effusions.  No lower extremity tenderness.  No edema. Neurologic:  Normal speech and language. No gross or focal neurologic deficits are appreciated. Skin:  Skin is warm, dry and intact. No rash noted. Psychiatric: Mood and affect are normal. Speech and behavior are normal. Patient exhibits appropriate insight and judgment.   ____________________________________________  LABS (pertinent positives/negatives) I, Lisa Roca, MD the attending physician have reviewed the labs noted below.  Labs Reviewed  COMPREHENSIVE METABOLIC PANEL - Abnormal; Notable for the following components:      Result Value   Glucose, Bld 356 (*)    Alkaline Phosphatase 141 (*)    All other components within normal limits  CBC - Abnormal; Notable for the following components:   WBC 11.2 (*)    RBC 5.63 (*)    HCT 47.9 (*)    All other components within normal limits  URINALYSIS, ROUTINE W REFLEX MICROSCOPIC - Abnormal; Notable for the following components:   Color, Urine YELLOW (*)    APPearance HAZY (*)    Glucose, UA >=500 (*)    Hgb urine dipstick LARGE (*)    Protein, ur 30 (*)    Leukocytes, UA TRACE (*)    Squamous Epithelial / LPF 0-5 (*)    All other components within normal limits  LIPASE, BLOOD    ____________________________________________    EKG I, Lisa Roca, MD, the attending physician have personally viewed and interpreted all ECGs.  None ____________________________________________  RADIOLOGY All Xrays were viewed by me.  Imaging interpreted by Radiologist, and I, Lisa Roca, MD the attending physician have reviewed the radiologist interpretation noted below.  Ct abd renal study:  IMPRESSION: 1. There is a 1 mm left UPJ calculus resulting in mild left hydronephrosis and perinephric stranding. 2. High density dependently in the gallbladder likely reflecting tiny cholelithiasis versus sludge. 3.  Aortic Atherosclerosis (ICD10-170.0) __________________________________________  PROCEDURES  Procedure(s) performed: None  Critical Care performed: None  ____________________________________________  ED COURSE / ASSESSMENT AND PLAN  Pertinent labs & imaging results that were available during my care of the patient were reviewed by me and considered in my medical decision making (see chart for details).  Patient is an initial complaint was low back pain followed by radiation down into the left lower quadrant, severe and intermittent, currently nearly gone.  She is also found to have hematuria.  She never had a kidney stone before, and we discussed whether not to CT for confirmation of diagnosis, and chose to proceed after discussion of risk versus benefit.  She also had some midline back pain, will be imaged on CT.  She does not report a trauma or known low back pain issues.  Small left UPJ calculus on the left consistent with patient's symptoms.  CT showed possible gallbladder sludge versus cholelithiasis, she is not having symptoms related to that.  She does have hyperglycemia, there is no sign of renal failure.  No sign of urinary tract infection.  I will sent for culture.  We will send patient with symptom medic medication for pain, nausea, as well as Flomax and a urine strainer.  No saline drug database checked for narcotics.  Does have COPD and is here although I do not think he is having acute exacerbation, she was given DuoNeb treatment here.   DIFFERENTIAL DIAGNOSIS: Including but not limited to compression fracture, radiculopathy, kidney stone, ureteral colic, intra-abdominal source of back pain including aortic aneurysm, etc.  CONSULTATIONS:    None   Patient / Family / Caregiver informed of clinical course, medical decision-making process, and agree with plan.   I discussed return precautions, follow-up instructions, and discharge instructions with patient and/or family.  Discharge Instructions : You are evaluated for flank pain and blood in the urine and found to have a kidney stone.  Return to the emergency department immediately for uncontrolled pain, inability to urinate, fever, or any other symptoms concerning to you.  If you have not passed a starting or stopping symptoms after 1 week, you should follow-up with a urologist to discuss further management.      ___________________________________________   FINAL CLINICAL IMPRESSION(S) / ED DIAGNOSES   Final diagnoses:  Hematuria, microscopic  Kidney stone      ___________________________________________        Note: This dictation was prepared with Dragon dictation. Any transcriptional errors that result from this process are unintentional    Lisa Roca, MD 08/19/17 [id number]

## 2017-08-19 NOTE — Telephone Encounter (Signed)
FYI

## 2017-08-19 NOTE — ED Triage Notes (Signed)
Pt arrives to ED for back pain and L side pain with vomiting this AM. Denies urinary symptoms. Denies kidney stone hx. Has had UTI before. Alert oriented, in wheelchair. Symptoms began this AM.

## 2017-08-19 NOTE — Discharge Instructions (Signed)
You are evaluated for flank pain and blood in the urine and found to have a kidney stone.  Return to the emergency department immediately for uncontrolled pain, inability to urinate, fever, or any other symptoms concerning to you.  If you have not passed a starting or stopping symptoms after 1 week, you should follow-up with a urologist to discuss further management.

## 2017-08-20 ENCOUNTER — Telehealth: Payer: Self-pay | Admitting: Family Medicine

## 2017-08-20 NOTE — Telephone Encounter (Signed)
Pt called with medication question regarding Flomax. States she read "online" not for use in women. Prescribed for her in ED yesterday for kidney stones. Reviewed use of Flomax in women and directed to take as prescribed. Pt verbalizes understanding.

## 2017-08-26 ENCOUNTER — Ambulatory Visit: Payer: Medicare Other | Admitting: Family Medicine

## 2017-09-09 ENCOUNTER — Ambulatory Visit: Payer: Medicare Other | Admitting: Family Medicine

## 2017-09-30 ENCOUNTER — Ambulatory Visit (INDEPENDENT_AMBULATORY_CARE_PROVIDER_SITE_OTHER): Payer: Medicare Other

## 2017-09-30 VITALS — BP 138/70 | HR 81 | Temp 97.9°F | Resp 16 | Ht 64.5 in | Wt 201.6 lb

## 2017-09-30 DIAGNOSIS — Z0000 Encounter for general adult medical examination without abnormal findings: Secondary | ICD-10-CM

## 2017-09-30 NOTE — Patient Instructions (Signed)
[name] , Thank you for taking time to come for your Medicare Wellness Visit. I appreciate your ongoing commitment to your health goals. Please review the following plan we discussed and let me know if I can assist you in the future.   Screening recommendations/referrals: Colonoscopy: declined Mammogram: declined Bone Density: declined Recommended yearly ophthalmology/optometry visit for glaucoma screening and checkup Recommended yearly dental visit for hygiene and checkup  Vaccinations: Influenza vaccine: up to date Pneumococcal vaccine: up to date Tdap vaccine: up to date Shingles vaccine: up to date  Advanced directives: copy on file   Conditions/risks identified: Smoking cessation discussed  Next appointment: Follow up in one year for your annual wellness exam.    Preventive Care 58 Years and Older, Female Preventive care refers to lifestyle choices and visits with your health care provider that can promote health and wellness. What does preventive care include?  A yearly physical exam. This is also called an annual well check.  Dental exams once or twice a year.  Routine eye exams. Ask your health care provider how often you should have your eyes checked.  Personal lifestyle choices, including:  Daily care of your teeth and gums.  Regular physical activity.  Eating a healthy diet.  Avoiding tobacco and drug use.  Limiting alcohol use.  Practicing safe sex.  Taking low-dose aspirin every day.  Taking vitamin and mineral supplements as recommended by your health care provider. What happens during an annual well check? The services and screenings done by your health care provider during your annual well check will depend on your age, overall health, lifestyle risk factors, and family history of disease. Counseling  Your health care provider may ask you questions about your:  Alcohol use.  Tobacco use.  Drug use.  Emotional well-being.  Home and  relationship well-being.  Sexual activity.  Eating habits.  History of falls.  Memory and ability to understand (cognition).  Work and work Statistician.  Reproductive health. Screening  You may have the following tests or measurements:  Height, weight, and BMI.  Blood pressure.  Lipid and cholesterol levels. These may be checked every 5 years, or more frequently if you are over 54 years old.  Skin check.  Lung cancer screening. You may have this screening every year starting at age 68 if you have a 30-pack-year history of smoking and currently smoke or have quit within the past 15 years.  Fecal occult blood test (FOBT) of the stool. You may have this test every year starting at age 86.  Flexible sigmoidoscopy or colonoscopy. You may have a sigmoidoscopy every 5 years or a colonoscopy every 10 years starting at age 51.  Hepatitis C blood test.  Hepatitis B blood test.  Sexually transmitted disease (STD) testing.  Diabetes screening. This is done by checking your blood sugar (glucose) after you have not eaten for a while (fasting). You may have this done every 1-3 years.  Bone density scan. This is done to screen for osteoporosis. You may have this done starting at age 45.  Mammogram. This may be done every 1-2 years. Talk to your health care provider about how often you should have regular mammograms. Talk with your health care provider about your test results, treatment options, and if necessary, the need for more tests. Vaccines  Your health care provider may recommend certain vaccines, such as:  Influenza vaccine. This is recommended every year.  Tetanus, diphtheria, and acellular pertussis (Tdap, Td) vaccine. You may need a Td  booster every 10 years.  Zoster vaccine. You may need this after age 59.  Pneumococcal 13-valent conjugate (PCV13) vaccine. One dose is recommended after age 22.  Pneumococcal polysaccharide (PPSV23) vaccine. One dose is recommended after  age 35. Talk to your health care provider about which screenings and vaccines you need and how often you need them. This information is not intended to replace advice given to you by your health care provider. Make sure you discuss any questions you have with your health care provider. Document Released: 09/13/2015 Document Revised: 05/06/2016 Document Reviewed: 06/18/2015 Elsevier Interactive Patient Education  2017 [place] Prevention in the Home Falls can cause injuries. They can happen to people of all ages. There are many things you can do to make your home safe and to help prevent falls. What can I do on the outside of my home?  Regularly fix the edges of walkways and driveways and fix any cracks.  Remove anything that might make you trip as you walk through a door, such as a raised step or threshold.  Trim any bushes or trees on the path to your home.  Use bright outdoor lighting.  Clear any walking paths of anything that might make someone trip, such as rocks or tools.  Regularly check to see if handrails are loose or broken. Make sure that both sides of any steps have handrails.  Any raised decks and porches should have guardrails on the edges.  Have any leaves, snow, or ice cleared regularly.  Use sand or salt on walking paths during winter.  Clean up any spills in your garage right away. This includes oil or grease spills. What can I do in the bathroom?  Use night lights.  Install grab bars by the toilet and in the tub and shower. Do not use towel bars as grab bars.  Use non-skid mats or decals in the tub or shower.  If you need to sit down in the shower, use a plastic, non-slip stool.  Keep the floor dry. Clean up any water that spills on the floor as soon as it happens.  Remove soap buildup in the tub or shower regularly.  Attach bath mats securely with double-sided non-slip rug tape.  Do not have throw rugs and other things on the floor that can  make you trip. What can I do in the bedroom?  Use night lights.  Make sure that you have a light by your bed that is easy to reach.  Do not use any sheets or blankets that are too big for your bed. They should not hang down onto the floor.  Have a firm chair that has side arms. You can use this for support while you get dressed.  Do not have throw rugs and other things on the floor that can make you trip. What can I do in the kitchen?  Clean up any spills right away.  Avoid walking on wet floors.  Keep items that you use a lot in easy-to-reach places.  If you need to reach something above you, use a strong step stool that has a grab bar.  Keep electrical cords out of the way.  Do not use floor polish or wax that makes floors slippery. If you must use wax, use non-skid floor wax.  Do not have throw rugs and other things on the floor that can make you trip. What can I do with my stairs?  Do not leave any items on the stairs.  Make sure that there are handrails on both sides of the stairs and use them. Fix handrails that are broken or loose. Make sure that handrails are as long as the stairways.  Check any carpeting to make sure that it is firmly attached to the stairs. Fix any carpet that is loose or worn.  Avoid having throw rugs at the top or bottom of the stairs. If you do have throw rugs, attach them to the floor with carpet tape.  Make sure that you have a light switch at the top of the stairs and the bottom of the stairs. If you do not have them, ask someone to add them for you. What else can I do to help prevent falls?  Wear shoes that:  Do not have high heels.  Have rubber bottoms.  Are comfortable and fit you well.  Are closed at the toe. Do not wear sandals.  If you use a stepladder:  Make sure that it is fully opened. Do not climb a closed stepladder.  Make sure that both sides of the stepladder are locked into place.  Ask someone to hold it for you,  if possible.  Clearly mark and make sure that you can see:  Any grab bars or handrails.  First and last steps.  Where the edge of each step is.  Use tools that help you move around (mobility aids) if they are needed. These include:  Canes.  Walkers.  Scooters.  Crutches.  Turn on the lights when you go into a dark area. Replace any light bulbs as soon as they burn out.  Set up your furniture so you have a clear path. Avoid moving your furniture around.  If any of your floors are uneven, fix them.  If there are any pets around you, be aware of where they are.  Review your medicines with your doctor. Some medicines can make you feel dizzy. This can increase your chance of falling. Ask your doctor what other things that you can do to help prevent falls. This information is not intended to replace advice given to you by your health care provider. Make sure you discuss any questions you have with your health care provider. Document Released: 06/13/2009 Document Revised: 01/23/2016 Document Reviewed: 09/21/2014 Elsevier Interactive Patient Education  2017 [employer].

## 2017-09-30 NOTE — Progress Notes (Signed)
Subjective:   Sarah White is a 72 y.o. female who presents for an Initial Medicare Annual Wellness Visit.  Review of Systems     Cardiac Risk Factors include: advanced age (>63men, >43 women);hypertension;dyslipidemia;family history of premature cardiovascular disease;smoking/ tobacco exposure     Objective:    Today's Vitals   09/30/17 1508  BP: 138/70  Pulse: 81  Resp: 16  Temp: 97.9 F (36.6 C)  TempSrc: Temporal  Weight: 201 lb 9.6 oz (91.4 kg)  Height: 5' 4.5" (1.638 m)   Body mass index is 34.07 kg/m.  Advanced Directives 09/30/2017 08/19/2017 11/26/2016 03/11/2016 03/11/2016 07/17/2015 07/17/2015  Does Patient Have a Medical Advance Directive? Yes Yes Yes Yes Yes Yes No  Type of Paramedic of [place];Living will [place];Living will [place];Living will Living will Living will;Out of facility DNR (pink MOST or yellow form) Living will -  Does patient want to make changes to medical advance directive? - - No - Patient declined No - Patient declined - No - Patient declined -  Copy of [place] in Chart? Yes - No - copy requested Yes No - copy requested Yes -  Would patient like information on creating a medical advance directive? - - - - - - No - patient declined information    Current Medications (verified) Outpatient Encounter Medications as of 09/30/2017  Medication Sig  . albuterol (PROAIR HFA) 108 (90 Base) MCG/ACT inhaler Inhale 1-2 puffs into the lungs every 4 (four) hours as needed for wheezing or shortness of breath.  Marland Kitchen aspirin EC 81 MG tablet Take 81 mg by mouth daily.  Marland Kitchen atorvastatin (LIPITOR) 40 MG tablet Take 1 tablet (40 mg total) by mouth daily.  . budesonide-formoterol (SYMBICORT) 160-4.5 MCG/ACT inhaler Inhale 2 puffs into the lungs 2 (two) times daily.  . carvedilol (COREG) 12.5 MG tablet TAKE TWO TABLETS BY MOUTH TWICE DAILY WITH A MEAL  . docusate sodium (COLACE) 100  MG capsule Take 100 mg by mouth daily.   Marland Kitchen doxylamine, Sleep, (UNISOM) 25 MG tablet Take 12.5 mg by mouth at bedtime as needed for sleep.   . Dulaglutide (TRULICITY) 1.5 XF/8.1WE SOPN Inject 1.5 mg into the skin once a week.  . furosemide (LASIX) 40 MG tablet Take 1 tablet (40 mg total) by mouth daily.  . isosorbide mononitrate (IMDUR) 60 MG 24 hr tablet Take 1 tablet (60 mg total) by mouth daily.  . meclizine (ANTIVERT) 12.5 MG tablet Take 1 tablet (12.5 mg total) by mouth 3 (three) times daily as needed for dizziness.  . nitroGLYCERIN (NITROSTAT) 0.4 MG SL tablet Place 1 tablet (0.4 mg total) under the tongue every 5 (five) minutes as needed for chest pain.  Marland Kitchen ondansetron (ZOFRAN-ODT) 4 MG disintegrating tablet Take 4 mg by mouth every 8 (eight) hours as needed for nausea or vomiting.  . sertraline (ZOLOFT) 50 MG tablet Take one pill daily  . tiotropium (SPIRIVA HANDIHALER) 18 MCG inhalation capsule Place 1 capsule (18 mcg total) into inhaler and inhale daily.  Marland Kitchen albuterol (PROVENTIL) (2.5 MG/3ML) 0.083% nebulizer solution Take 3 mLs (2.5 mg total) by nebulization every 6 (six) hours as needed for wheezing or shortness of breath. (Patient not taking: Reported on 09/30/2017)  . ibuprofen (ADVIL,MOTRIN) 800 MG tablet Take 1 tablet (800 mg total) by mouth every 8 (eight) hours as needed. (Patient not taking: Reported on 09/30/2017)  . Multiple Vitamin (MULTIVITAMIN WITH MINERALS) TABS tablet Take 1 tablet  by mouth daily.  . ondansetron (ZOFRAN) 4 MG tablet Take 1 tablet (4 mg total) by mouth every 8 (eight) hours as needed for nausea or vomiting. (Patient not taking: Reported on 09/30/2017)  . [DISCONTINUED] acetaminophen (TYLENOL) 500 MG tablet Take 500 mg by mouth every 6 (six) hours as needed for mild pain, fever or headache.   . [DISCONTINUED] azithromycin (ZITHROMAX) 250 MG tablet Take 2 tabs day one, then 1 tab daily until complete (Patient not taking: Reported on 09/30/2017)  . [DISCONTINUED]  HYDROcodone-acetaminophen (NORCO/VICODIN) 5-325 MG tablet Take 1 tablet by mouth every 6 (six) hours as needed for moderate pain. (Patient not taking: Reported on 09/30/2017)  . [DISCONTINUED] predniSONE (DELTASONE) 10 MG tablet Take 6 tabs day one, 5 tabs day two, 4 tabs day three, etc (Patient not taking: Reported on 09/30/2017)  . [DISCONTINUED] tamsulosin (FLOMAX) 0.4 MG CAPS capsule Take 1 capsule (0.4 mg total) by mouth daily. (Patient not taking: Reported on 09/30/2017)   No facility-administered encounter medications on file as of 09/30/2017.     Allergies (verified) Amlodipine; Codeine; Hctz [hydrochlorothiazide]; Lisinopril; and Metformin and related   History: Past Medical History:  Diagnosis Date  . Acute respiratory failure with hypoxia and hypercarbia ([place]) 07/22/2015  . Cancer ([place])    uterine  . CHF (congestive heart failure) ([place])   . COPD (chronic obstructive pulmonary disease) ([place])   . Diabetes mellitus without complication ([place])   . Encephalopathy acute 07/22/2015  . Hypertension   . Pneumonia    November 2016  . Pulmonary edema 07/22/2015  . 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 ABDOMINAL HYSTERECTOMY    . CARPAL TUNNEL RELEASE Bilateral   . CESAREAN SECTION    . CORONARY ANGIOPLASTY WITH STENT PLACEMENT    . KNEE SURGERY Left   . LEFT HEART CATH AND CORONARY ANGIOGRAPHY Right 11/26/2016   Procedure: Left Heart Cath and Coronary Angiography;  Surgeon: Isaias Cowman, MD;  Location: [facility];  Service: Cardiovascular;  Laterality: Right;   Family History  Problem Relation Age of Onset  . Heart disease Mother    Social History   Socioeconomic History  . Marital status: Divorced    Spouse name: None  . Number of children: None  . Years of education: None  . Highest education level: None  Social Needs  . Financial resource strain: Somewhat hard  . Food insecurity - worry:  Never true  . Food insecurity - inability: Never true  . Transportation needs - medical: No  . Transportation needs - non-medical: No  Occupational History  . None  Tobacco Use  . Smoking status: Current Every Day Smoker    Packs/day: 0.50    Types: Cigarettes    Last attempt to quit: 07/17/2015    Years since quitting: 2.2  . Smokeless tobacco: Never Used  Substance and Sexual Activity  . Alcohol use: No  . Drug use: No  . Sexual activity: No  Other Topics Concern  . None  Social History Narrative  . None    Tobacco Counseling Ready to quit: No Counseling given: Yes   Clinical Intake:  Pre-visit preparation completed: Yes  Pain : No/denies pain     Nutritional Status: BMI > 30  Obese Nutritional Risks: None Diabetes: Yes CBG done?: No Did pt. bring in CBG monitor from home?: No  How often do you need to have someone help you when  you read instructions, pamphlets, or other written materials from your doctor or pharmacy?: 1 - Never What is the last grade level you completed in school?: 2 years college   Interpreter Needed?: No  Information entered by :: Teddie Mehta,LPN   Activities of Daily Living In your present state of health, do you have any difficulty performing the following activities: 09/30/2017 01/01/2017  Hearing? N N  Vision? N N  Difficulty concentrating or making decisions? N N  Walking or climbing stairs? Y Y  Comment avoids stairs  -  Dressing or bathing? N N  Doing errands, shopping? Y N  Comment son drives  -  Conservation officer, nature and eating ? N -  Using the Toilet? N -  In the past six months, have you accidently leaked urine? N -  Do you have problems with loss of bowel control? N -  Managing your Medications? N -  Managing your Finances? N -  Housekeeping or managing your Housekeeping? N -  Comment son assists  -  Some recent data might be hidden     Immunizations and Health Maintenance Immunization History  Administered Date(s)  Administered  . Influenza, High Dose Seasonal PF 05/18/2016, 05/25/2017  . Influenza, Seasonal, Injecte, Preservative Fre 06/05/2015  . Pneumococcal Conjugate-13 07/29/2015  . Pneumococcal Polysaccharide-23 09/15/2016  . Tdap 09/24/2015  . Zoster 02/07/2015   Health Maintenance Due  Topic Date Due  . FOOT EXAM  09/15/2017    [name], [name] Yolonda Kida, MD as Consulting Physician (Cardiology) Yolonda Kida, MD as Consulting Physician (Cardiology)  Indicate any recent Medical Services you may have received from other than [facility] providers in the past year (date may be approximate).     Assessment:   This is a routine wellness examination for Dalphine.  Hearing/Vision screen Vision Screening Comments: No eye doctor due to insurance  Dietary issues and exercise activities discussed: Current Exercise Habits: The patient does not participate in regular exercise at present  Goals    . Quit Smoking     Smoking cessation discussed      Depression Screen PHQ 2/9 Scores 09/30/2017 01/01/2017 12/01/2016 09/15/2016 07/29/2015  PHQ - 2 Score 1 0 6 0 0  PHQ- 9 Score 4 3 17  - -    Fall Risk Fall Risk  09/30/2017 09/15/2016 07/29/2015  Falls in the past year? Yes Yes Yes  Number falls in past yr: 2 or more 2 or more 2 or more  Injury with Fall? No No Yes  Follow up - Falls evaluation completed -    Is the patient's home free of loose throw rugs in walkways, pet beds, electrical cords, etc?   yes      Grab bars in the bathroom? yes      Handrails on the stairs?   yes      Adequate lighting?   yes  Timed Get Up and Go Performed Completed in 8 seconds with no use of assistive devices, steady gait. No intervention needed at this time.   Cognitive Function:     6CIT Screen 09/30/2017  What Year? 0 points  What month? 0 points  What time? 0 points  Count back from 20 0 points  Months in reverse 0 points  Repeat  phrase 0 points  Total Score 0    Screening Tests Health Maintenance  Topic Date Due  . FOOT EXAM  09/15/2017  . MAMMOGRAM  11/22/2017 (  Originally 07/05/1998)  . OPHTHALMOLOGY EXAM  11/22/2017 (Originally 09/16/2016)  . DEXA SCAN  11/22/2017 (Originally 07/05/2013)  . COLONOSCOPY  11/22/2017 (Originally 07/05/1998)  . HEMOGLOBIN A1C  11/22/2017  . URINE MICROALBUMIN  05/25/2018  . TETANUS/TDAP  09/23/2025  . INFLUENZA VACCINE  Completed  . Hepatitis C Screening  Completed  . PNA vac Low Risk Adult  Completed    Qualifies for Shingles Vaccine? Discussed shingrix   Cancer Screenings: Lung: Low Dose CT Chest recommended if Age 9-80 years, 30 pack-year currently smoking OR have quit w/in 15years. Patient does qualify.- declined Breast: Up to date on Mammogram? No  -declined Up to date of Bone Density/Dexa? No- declined Colorectal: due - declined  Additional Screenings:  Hepatitis B/HIV/Syphillis: not indicated Hepatitis C Screening: done 08/12/2015     Plan:    I have personally reviewed and addressed the Medicare Annual Wellness questionnaire and have noted the following in the patient's chart:  A. Medical and social history B. Use of alcohol, tobacco or illicit drugs  C. Current medications and supplements D. Functional ability and status E.  Nutritional status F.  Physical activity G. Advance directives H. List of other physicians I.  Hospitalizations, surgeries, and ER visits in previous 12 months J.  [place] such as hearing and vision if needed, cognitive and depression L. Referrals and appointments   In addition, I have reviewed and discussed with patient certain preventive protocols, quality metrics, and best practice recommendations. A written personalized care plan for preventive services as well as general preventive health recommendations were provided to patient.   Signed,  Tyler Aas, LPN Nurse Health Advisor   Nurse Notes:pateint advised  to reschedule her last cancelled appointment for follow up.

## 2017-10-22 ENCOUNTER — Ambulatory Visit: Payer: Medicare Other | Admitting: Family Medicine

## 2018-01-07 ENCOUNTER — Other Ambulatory Visit: Payer: Self-pay | Admitting: Family Medicine

## 2018-02-15 ENCOUNTER — Other Ambulatory Visit: Payer: Self-pay | Admitting: Family Medicine

## 2018-02-18 NOTE — Telephone Encounter (Signed)
Lasix 40 mg refill request  LOV 02/22/17 with Dr. Wynetta Emery.     Has 3 F/U appts that have been cancelled by pt.   No future appts noted.  [place] (N), [place] - [address]

## 2018-02-27 IMAGING — DX DG CHEST 1V PORT
1 series · 1 of 1 positions shown · non-contrast
Comparison: 03/11/2016

CLINICAL DATA: Awoke with central chest pain.

EXAM:
PORTABLE CHEST 1 VIEW

[chest ap]
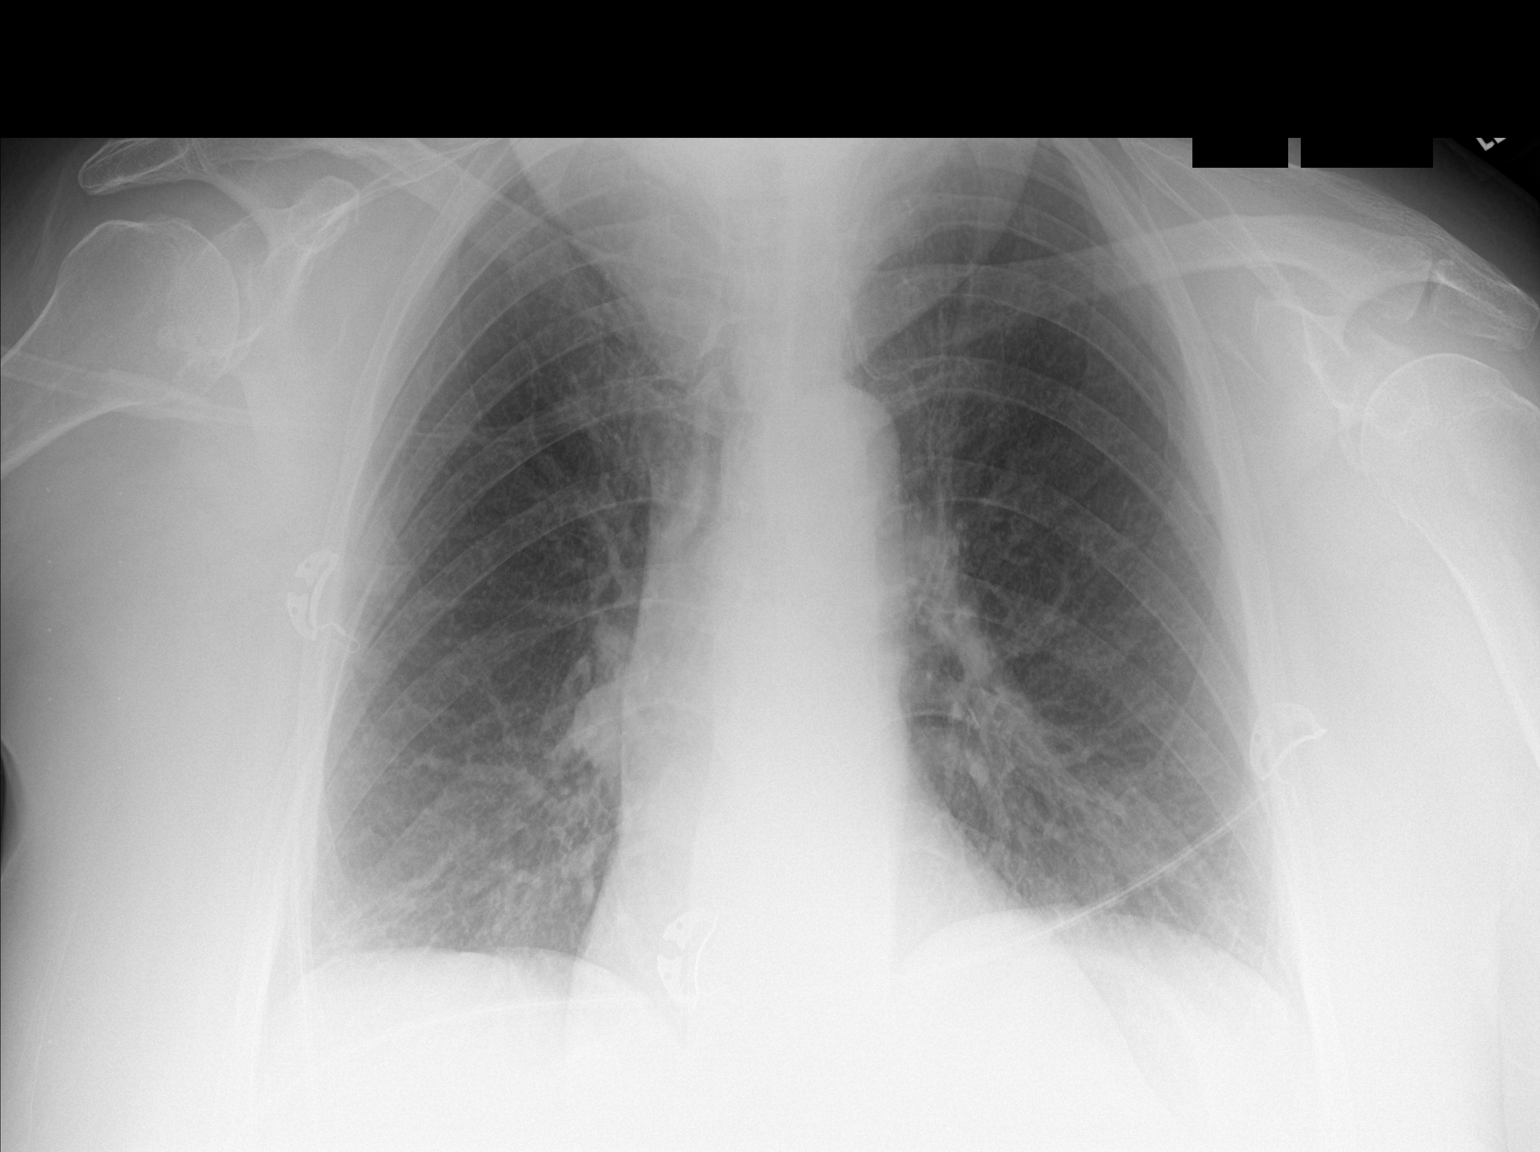

[1 of 1 positions shown; findings below may reference images not displayed]

FINDINGS: Imaging obtained in anti lordotic positioning, patient's chin
obscures the apices. Unchanged heart size and mediastinal contours.
There is mild bronchial thickening. Minimal streaky infrahilar
atelectasis. No confluent airspace disease. No evidence of
pneumothorax, pleural effusion or pulmonary edema. No acute osseous
abnormalities are seen.
IMPRESSION: Bronchial thickening with streaky bibasilar atelectasis.

## 2018-03-09 ENCOUNTER — Other Ambulatory Visit: Payer: Self-pay | Admitting: Family Medicine

## 2018-03-18 ENCOUNTER — Other Ambulatory Visit: Payer: Self-pay | Admitting: Family Medicine

## 2018-03-18 NOTE — Telephone Encounter (Signed)
Needs follow up appointment.  

## 2018-03-22 NOTE — Telephone Encounter (Signed)
Called pt, no answer. Letter printed to be mailed.

## 2018-03-24 ENCOUNTER — Ambulatory Visit (INDEPENDENT_AMBULATORY_CARE_PROVIDER_SITE_OTHER): Payer: Medicare Other | Admitting: Family Medicine

## 2018-03-24 ENCOUNTER — Encounter: Payer: Self-pay | Admitting: Family Medicine

## 2018-03-24 VITALS — BP 182/107 | HR 74 | Temp 98.5°F | Wt 205.2 lb

## 2018-03-24 DIAGNOSIS — I2510 Atherosclerotic heart disease of native coronary artery without angina pectoris: Secondary | ICD-10-CM

## 2018-03-24 DIAGNOSIS — Z599 Problem related to housing and economic circumstances, unspecified: Secondary | ICD-10-CM

## 2018-03-24 DIAGNOSIS — F321 Major depressive disorder, single episode, moderate: Secondary | ICD-10-CM

## 2018-03-24 DIAGNOSIS — E782 Mixed hyperlipidemia: Secondary | ICD-10-CM | POA: Diagnosis not present

## 2018-03-24 DIAGNOSIS — Z598 Other problems related to housing and economic circumstances: Secondary | ICD-10-CM | POA: Diagnosis not present

## 2018-03-24 DIAGNOSIS — N183 Chronic kidney disease, stage 3 (moderate): Secondary | ICD-10-CM | POA: Diagnosis not present

## 2018-03-24 DIAGNOSIS — J449 Chronic obstructive pulmonary disease, unspecified: Secondary | ICD-10-CM

## 2018-03-24 DIAGNOSIS — E1122 Type 2 diabetes mellitus with diabetic chronic kidney disease: Secondary | ICD-10-CM

## 2018-03-24 DIAGNOSIS — I129 Hypertensive chronic kidney disease with stage 1 through stage 4 chronic kidney disease, or unspecified chronic kidney disease: Secondary | ICD-10-CM

## 2018-03-24 LAB — UA/M W/RFLX CULTURE, ROUTINE
Bilirubin, UA: NEGATIVE
Ketones, UA: NEGATIVE
Leukocytes, UA: NEGATIVE
Nitrite, UA: NEGATIVE
RBC, UA: NEGATIVE
Specific Gravity, UA: 1.01 (ref 1.005–1.030)
UUROB: 0.2 mg/dL (ref 0.2–1.0)
pH, UA: 7 (ref 5.0–7.5)

## 2018-03-24 LAB — MICROALBUMIN, URINE WAIVED
CREATININE, URINE WAIVED: 50 mg/dL (ref 10–300)
MICROALB, UR WAIVED: 80 mg/L — AB (ref 0–19)
Microalb/Creat Ratio: >300 mg/g — ABNORMAL HIGH (ref <30)

## 2018-03-24 LAB — BAYER DCA HB A1C WAIVED: HB A1C (BAYER DCA - WAIVED): 9.1 % — ABNORMAL HIGH (ref <7.0)

## 2018-03-24 MED ORDER — TIOTROPIUM BROMIDE MONOHYDRATE 18 MCG IN CAPS: 18.0000 ug | ORAL_CAPSULE | Freq: Every day | RESPIRATORY_TRACT | 3 refills | Status: DC

## 2018-03-24 MED ORDER — ATORVASTATIN CALCIUM 40 MG PO TABS: 40.0000 mg | ORAL_TABLET | Freq: Every day | ORAL | 1 refills | Status: DC

## 2018-03-24 MED ORDER — ISOSORBIDE MONONITRATE ER 60 MG PO TB24: 60.0000 mg | ORAL_TABLET | Freq: Every day | ORAL | 1 refills | Status: DC

## 2018-03-24 MED ORDER — SERTRALINE HCL 100 MG PO TABS: ORAL_TABLET | ORAL | 1 refills | Status: DC

## 2018-03-24 MED ORDER — CARVEDILOL 12.5 MG PO TABS: ORAL_TABLET | ORAL | 1 refills | Status: DC

## 2018-03-24 MED ORDER — FUROSEMIDE 40 MG PO TABS: 40.0000 mg | ORAL_TABLET | Freq: Every day | ORAL | 1 refills | Status: DC

## 2018-03-24 MED ORDER — NITROGLYCERIN 0.4 MG SL SUBL: 0.4000 mg | SUBLINGUAL_TABLET | SUBLINGUAL | 12 refills | Status: DC | PRN

## 2018-03-24 NOTE — Assessment & Plan Note (Signed)
Not under good control. Will increase her sertraline to 100mg  and recheck 1 month.

## 2018-03-24 NOTE — Assessment & Plan Note (Signed)
Not under good control. Will restart her cavedilol and recheck 1 month. Call with any concerns.

## 2018-03-24 NOTE — Assessment & Plan Note (Signed)
Stable. Continue spiriva and albuterol. Call with any concerns.

## 2018-03-24 NOTE — Assessment & Plan Note (Signed)
Will keep BP and cholesterol and DM under good control- working on that. Call with any concerns. Continue to monitor.

## 2018-03-24 NOTE — Assessment & Plan Note (Signed)
Rechecking levels today. Refills given today. Call with any concerns.

## 2018-03-24 NOTE — Progress Notes (Signed)
BP (!) 182/107 (BP Location: Left Arm, Patient Position: Sitting, Cuff Size: Large)   Pulse 74   Temp 98.5 F (36.9 C)   Wt 205 lb 4 oz (93.1 kg)   SpO2 94%   BMI 34.69 kg/m    Subjective:    [name], female    DOB: 12-05-1945, 72 y.o.   MRN: [id number]  HPI: Sarah White is a 72 y.o. female  Chief Complaint  Patient presents with  . Diabetes   HYPERTENSION / HYPERLIPIDEMIA- has been out of her carvedilol  Satisfied with current treatment? no Duration of hypertension: chronic BP monitoring frequency: not checking BP range:  BP medication side effects: no Past BP meds: carvedilol, lasix, imdur Duration of hyperlipidemia: chronic Cholesterol medication side effects: no Cholesterol supplements: none Past cholesterol medications: atorvastatin Medication compliance: fair compliance Aspirin: yes Recent stressors: no Recurrent headaches: no Visual changes: no Palpitations: no Dyspnea: no Chest pain: no Lower extremity edema: no Dizzy/lightheaded: no  DIABETES- hasn't been taking anything for her sugars Hypoglycemic episodes:no Polydipsia/polyuria: yes Visual disturbance: no Chest pain: no Paresthesias: yes Glucose Monitoring: no Taking Insulin?: no Blood Pressure Monitoring: not checking Retinal Examination: Not up to Date Foot Exam: Done today Diabetic Education: Completed Pneumovax: Up to Date Influenza: Up to Date Aspirin: yes  DEPRESSION- hasn't been out of the house in 6 months and hadn't been out for 6 months prior to that.  Mood status: uncontrolled Satisfied with current treatment?: no Symptom severity: moderate  Duration of current treatment : chronic Side effects: no Medication compliance: good compliance Psychotherapy/counseling: no  Previous psychiatric medications: sertaline Depressed mood: yes Anxious mood: yes Anhedonia: yes Significant weight loss or gain: no Insomnia: no  Fatigue: yes Feelings of worthlessness  or guilt: yes Impaired concentration/indecisiveness: yes Suicidal ideations: yes Hopelessness: no Crying spells: yes Depression screen [facility] 2/9 03/24/2018 09/30/2017 01/01/2017 12/01/2016 09/15/2016  Decreased Interest 2 1 0 3 0  Down, Depressed, Hopeless 2 0 0 3 0  PHQ - 2 Score 4 1 0 6 0  Altered sleeping 2 1 0 2 -  Tired, decreased energy 2 1 2 3  -  Change in appetite 2 0 0 2 -  Feeling bad or failure about yourself  2 1 0 2 -  Trouble concentrating 2 0 1 2 -  Moving slowly or fidgety/restless 0 0 0 0 -  Suicidal thoughts 0 0 0 0 -  PHQ-9 Score 14 4 3 17  -  Difficult doing work/chores Somewhat difficult Not difficult at all - - -   Relevant past medical, surgical, family and social history reviewed and updated as indicated. Interim medical history since our last visit reviewed. Allergies and medications reviewed and updated.  Review of Systems  Constitutional: Negative.   Respiratory: Negative.   Cardiovascular: Negative.   Neurological: Negative.   Psychiatric/Behavioral: Negative.     Per HPI unless specifically indicated above     Objective:    BP (!) 182/107 (BP Location: Left Arm, Patient Position: Sitting, Cuff Size: Large)   Pulse 74   Temp 98.5 F (36.9 C)   Wt 205 lb 4 oz (93.1 kg)   SpO2 94%   BMI 34.69 kg/m   Wt Readings from Last 3 Encounters:  03/24/18 205 lb 4 oz (93.1 kg)  09/30/17 201 lb 9.6 oz (91.4 kg)  08/19/17 195 lb (88.5 kg)    Physical Exam  Constitutional: She is oriented to person, place, and time. She appears well-developed and  well-nourished. No distress.  HENT:  Head: Normocephalic and atraumatic.  Right Ear: Hearing normal.  Left Ear: Hearing normal.  Nose: Nose normal.  Eyes: Conjunctivae and lids are normal. Right eye exhibits no discharge. Left eye exhibits no discharge. No scleral icterus.  Cardiovascular: Normal rate, regular rhythm, normal heart sounds and intact distal pulses. Exam reveals no gallop and no friction rub.  No  murmur heard. Pulmonary/Chest: Effort normal and breath sounds normal. No stridor. No respiratory distress. She has no wheezes. She has no rales. She exhibits no tenderness.  Musculoskeletal: Normal range of motion.  Neurological: She is alert and oriented to person, place, and time.  Skin: Skin is warm, dry and intact. Capillary refill takes less than 2 seconds. No rash noted. She is not diaphoretic. No erythema. No pallor.  Psychiatric: She has a normal mood and affect. Her speech is normal and behavior is normal. Judgment and thought content normal. Cognition and memory are normal.  Nursing note and vitals reviewed.   Results for orders placed or performed during the hospital encounter of 08/19/17  Comprehensive metabolic panel  Result Value Ref Range   Sodium 138 135 - 145 mmol/L   Potassium 4.4 3.5 - 5.1 mmol/L   Chloride 102 101 - 111 mmol/L   CO2 26 22 - 32 mmol/L   Glucose, Bld 356 (H) 65 - 99 mg/dL   BUN 12 6 - 20 mg/dL   Creatinine, Ser 0.83 0.44 - 1.00 mg/dL   Calcium 9.2 8.9 - 10.3 mg/dL   Total Protein 6.7 6.5 - 8.1 g/dL   Albumin 3.6 3.5 - 5.0 g/dL   AST 25 15 - 41 U/L   ALT 21 14 - 54 U/L   Alkaline Phosphatase 141 (H) 38 - 126 U/L   Total Bilirubin 0.5 0.3 - 1.2 mg/dL   GFR calc non Af Amer >60 >60 mL/min   GFR calc Af Amer >60 >60 mL/min   Anion gap 10 5 - 15  CBC  Result Value Ref Range   WBC 11.2 (H) 3.6 - 11.0 K/uL   RBC 5.63 (H) 3.80 - 5.20 MIL/uL   Hemoglobin 15.3 12.0 - 16.0 g/dL   HCT 47.9 (H) 35.0 - 47.0 %   MCV 85.1 80.0 - 100.0 fL   MCH 27.3 26.0 - 34.0 pg   MCHC 32.0 32.0 - 36.0 g/dL   RDW 14.5 11.5 - 14.5 %   Platelets 151 150 - 440 K/uL  Lipase, blood  Result Value Ref Range   Lipase 41 11 - 51 U/L  Urinalysis, Routine w reflex microscopic  Result Value Ref Range   Color, Urine YELLOW (A) YELLOW   APPearance HAZY (A) CLEAR   Specific Gravity, Urine 1.012 1.005 - 1.030   pH 6.0 5.0 - 8.0   Glucose, UA >=500 (A) NEGATIVE mg/dL   Hgb urine  dipstick LARGE (A) NEGATIVE   Bilirubin Urine NEGATIVE NEGATIVE   Ketones, ur NEGATIVE NEGATIVE mg/dL   Protein, ur 30 (A) NEGATIVE mg/dL   Nitrite NEGATIVE NEGATIVE   Leukocytes, UA TRACE (A) NEGATIVE   RBC / HPF TOO NUMEROUS TO COUNT 0 - 5 RBC/hpf   WBC, UA 6-30 0 - 5 WBC/hpf   Bacteria, UA NONE SEEN NONE SEEN   Squamous Epithelial / LPF 0-5 (A) NONE SEEN   Mucus PRESENT    Hyaline Casts, UA PRESENT    Ca Oxalate Crys, UA PRESENT       Assessment & Plan:   Problem List  Items Addressed This Visit      Cardiovascular and Mediastinum   CAD (coronary artery disease) - Primary    Will keep BP and cholesterol and DM under good control- working on that. Call with any concerns. Continue to monitor.       Relevant Medications   carvedilol (COREG) 12.5 MG tablet   atorvastatin (LIPITOR) 40 MG tablet   furosemide (LASIX) 40 MG tablet   isosorbide mononitrate (IMDUR) 60 MG 24 hr tablet   nitroGLYCERIN (NITROSTAT) 0.4 MG SL tablet   Other Relevant Orders   CBC with Differential/Platelet   Comprehensive metabolic panel   TSH   UA/M w/rflx Culture, Routine     Respiratory   COPD (chronic obstructive pulmonary disease) (HCC)    Stable. Continue spiriva and albuterol. Call with any concerns.       Relevant Medications   tiotropium (SPIRIVA HANDIHALER) 18 MCG inhalation capsule   Other Relevant Orders   CBC with Differential/Platelet   Comprehensive metabolic panel   TSH   UA/M w/rflx Culture, Routine     Endocrine   Diabetes mellitus with chronic kidney disease ([place])    Not under good control with A1c of 9.1- will restart her trulicity and recheck in 1 month. Call with any concerns.       Relevant Medications   atorvastatin (LIPITOR) 40 MG tablet   Other Relevant Orders   Bayer DCA Hb A1c Waived   CBC with Differential/Platelet   Comprehensive metabolic panel   Microalbumin, Urine Waived   TSH   UA/M w/rflx Culture, Routine     Genitourinary   Hypertensive renal  disease    Not under good control. Will restart her cavedilol and recheck 1 month. Call with any concerns.       Relevant Orders   CBC with Differential/Platelet   Comprehensive metabolic panel   Microalbumin, Urine Waived   TSH   UA/M w/rflx Culture, Routine     Other   Hyperlipidemia    Rechecking levels today. Refills given today. Call with any concerns.       Relevant Medications   carvedilol (COREG) 12.5 MG tablet   atorvastatin (LIPITOR) 40 MG tablet   furosemide (LASIX) 40 MG tablet   isosorbide mononitrate (IMDUR) 60 MG 24 hr tablet   nitroGLYCERIN (NITROSTAT) 0.4 MG SL tablet   Other Relevant Orders   CBC with Differential/Platelet   Comprehensive metabolic panel   Lipid Panel w/o Chol/HDL Ratio   TSH   UA/M w/rflx Culture, Routine   Depression, major, single episode, moderate (HCC)    Not under good control. Will increase her sertraline to 100mg  and recheck 1 month.       Relevant Medications   sertraline (ZOLOFT) 100 MG tablet   Other Relevant Orders   CBC with Differential/Platelet   Comprehensive metabolic panel   TSH   UA/M w/rflx Culture, Routine    Other Visit Diagnoses    Financial difficulties       Cannot afford her medicine. Referral to [facility] made today.   Relevant Orders   Ambulatory referral to [facility]       Follow up plan: Return in about 1 month (around 04/21/2018).

## 2018-03-24 NOTE — Assessment & Plan Note (Signed)
Not under good control with A1c of 9.1- will restart her trulicity and recheck in 1 month. Call with any concerns.

## 2018-03-25 LAB — COMPREHENSIVE METABOLIC PANEL
ALBUMIN: 4 g/dL (ref 3.6–4.8)
ALK PHOS: 179 IU/L — AB (ref 39–117)
ALT: 12 IU/L (ref 0–32)
AST: 12 IU/L (ref 0–40)
Albumin/Globulin Ratio: 2 (ref 1.2–2.2)
BILIRUBIN TOTAL: 0.3 mg/dL (ref 0.0–1.2)
BUN/Creatinine Ratio: 11 — ABNORMAL LOW (ref 12–28)
BUN: 8 mg/dL (ref 8–27)
CHLORIDE: 98 mmol/L (ref 96–106)
CO2: 21 mmol/L (ref 20–29)
CREATININE: 0.72 mg/dL (ref 0.57–1.00)
Calcium: 8.9 mg/dL (ref 8.7–10.3)
GFR calc Af Amer: 99 mL/min/{1.73_m2} (ref >59)
GFR calc non Af Amer: 86 mL/min/{1.73_m2} (ref >59)
GLUCOSE: 353 mg/dL — AB (ref 65–99)
Globulin, Total: 2 g/dL (ref 1.5–4.5)
Potassium: 4.6 mmol/L (ref 3.5–5.2)
Sodium: 136 mmol/L (ref 134–144)
Total Protein: 6 g/dL (ref 6.0–8.5)

## 2018-03-25 LAB — CBC WITH DIFFERENTIAL/PLATELET
Basophils Absolute: 0 10*3/uL (ref 0.0–0.2)
Basos: 1 %
EOS (ABSOLUTE): 0.1 10*3/uL (ref 0.0–0.4)
EOS: 1 %
Hematocrit: 48.8 % — ABNORMAL HIGH (ref 34.0–46.6)
Hemoglobin: 15.5 g/dL (ref 11.1–15.9)
Immature Grans (Abs): 0 10*3/uL (ref 0.0–0.1)
Immature Granulocytes: 0 %
LYMPHS ABS: 1.5 10*3/uL (ref 0.7–3.1)
Lymphs: 19 %
MCH: 26.7 pg (ref 26.6–33.0)
MCHC: 31.8 g/dL (ref 31.5–35.7)
MCV: 84 fL (ref 79–97)
MONOS ABS: 0.5 10*3/uL (ref 0.1–0.9)
Monocytes: 6 %
NEUTROS PCT: 73 %
Neutrophils Absolute: 5.5 10*3/uL (ref 1.4–7.0)
PLATELETS: 147 10*3/uL — AB (ref 150–450)
RBC: 5.81 x10E6/uL — AB (ref 3.77–5.28)
RDW: 14.4 % (ref 12.3–15.4)
WBC: 7.6 10*3/uL (ref 3.4–10.8)

## 2018-03-25 LAB — LIPID PANEL W/O CHOL/HDL RATIO
CHOLESTEROL TOTAL: 177 mg/dL (ref 100–199)
HDL: 34 mg/dL — AB (ref >39)
LDL CALC: 92 mg/dL (ref 0–99)
TRIGLYCERIDES: 256 mg/dL — AB (ref 0–149)
VLDL CHOLESTEROL CAL: 51 mg/dL — AB (ref 5–40)

## 2018-03-25 LAB — TSH: TSH: 2.35 u[IU]/mL (ref 0.450–4.500)

## 2018-03-25 NOTE — Telephone Encounter (Signed)
Routing to close.  °

## 2018-04-01 ENCOUNTER — Other Ambulatory Visit: Payer: Self-pay | Admitting: Family Medicine

## 2018-05-05 ENCOUNTER — Other Ambulatory Visit: Payer: Self-pay

## 2018-05-05 ENCOUNTER — Ambulatory Visit (INDEPENDENT_AMBULATORY_CARE_PROVIDER_SITE_OTHER): Payer: Medicare Other | Admitting: Family Medicine

## 2018-05-05 ENCOUNTER — Encounter: Payer: Self-pay | Admitting: Family Medicine

## 2018-05-05 VITALS — BP 135/82 | HR 86 | Temp 98.4°F | Ht 64.7 in | Wt 206.0 lb

## 2018-05-05 DIAGNOSIS — N183 Chronic kidney disease, stage 3 unspecified: Secondary | ICD-10-CM

## 2018-05-05 DIAGNOSIS — Z23 Encounter for immunization: Secondary | ICD-10-CM

## 2018-05-05 DIAGNOSIS — F5101 Primary insomnia: Secondary | ICD-10-CM | POA: Diagnosis not present

## 2018-05-05 DIAGNOSIS — E1122 Type 2 diabetes mellitus with diabetic chronic kidney disease: Secondary | ICD-10-CM

## 2018-05-05 DIAGNOSIS — I129 Hypertensive chronic kidney disease with stage 1 through stage 4 chronic kidney disease, or unspecified chronic kidney disease: Secondary | ICD-10-CM | POA: Diagnosis not present

## 2018-05-05 DIAGNOSIS — F321 Major depressive disorder, single episode, moderate: Secondary | ICD-10-CM

## 2018-05-05 DIAGNOSIS — G4700 Insomnia, unspecified: Secondary | ICD-10-CM | POA: Insufficient documentation

## 2018-05-05 DIAGNOSIS — I2510 Atherosclerotic heart disease of native coronary artery without angina pectoris: Secondary | ICD-10-CM | POA: Diagnosis not present

## 2018-05-05 MED ORDER — TRAZODONE HCL 50 MG PO TABS: 25.0000 mg | ORAL_TABLET | Freq: Every evening | ORAL | 3 refills | Status: DC | PRN

## 2018-05-05 MED ORDER — DULAGLUTIDE 1.5 MG/0.5ML ~~LOC~~ SOAJ: 1.5000 mg | SUBCUTANEOUS | 6 refills | Status: DC

## 2018-05-05 NOTE — Assessment & Plan Note (Signed)
Doing better on her current regimen. Continue to monitor. Call with any concerns.

## 2018-05-05 NOTE — Assessment & Plan Note (Signed)
Will start trazodone. Call with any concerns. Continue to monitor.

## 2018-05-05 NOTE — Progress Notes (Signed)
BP 135/82   Pulse 86   Temp 98.4 F (36.9 C) (Oral)   Ht 5' 4.7" (1.643 m)   Wt 206 lb (93.4 kg)   SpO2 95%   BMI 34.60 kg/m    Subjective:    [name], female    DOB: [birth year], 72 y.o.   MRN: [id number]  HPI: Sarah White is a 72 y.o. female  Chief Complaint  Patient presents with  . Hypertension    1 m f/u/ pt would like to discuss insomnia  . Diabetes   DIABETES Hypoglycemic episodes:no Polydipsia/polyuria: yes Visual disturbance: no Chest pain: no Paresthesias: no Glucose Monitoring: yes  Accucheck frequency: Daily  Fasting glucose: 150-200 Taking Insulin?: no Blood Pressure Monitoring: not checking Retinal Examination: Not up to Date Foot Exam: Up to Date Diabetic Education: Completed Pneumovax: Up to Date Influenza: Given today Aspirin: no  HYPERTENSION Hypertension status: controlled  Satisfied with current treatment? yes Duration of hypertension: chronic BP monitoring frequency:  not checking BP medication side effects:  no Medication compliance: excellent compliance Aspirin: no Recurrent headaches: yes Visual changes: no Palpitations: no Dyspnea: no Chest pain: no Lower extremity edema: no Dizzy/lightheaded: no  DEPRESSION Mood status: better Satisfied with current treatment?: yes Symptom severity: mild  Duration of current treatment : chronic Side effects: no Medication compliance: excellent compliance Psychotherapy/counseling: no  Previous psychiatric medications: zoloft Depressed mood: yes Anxious mood: no Anhedonia: no Significant weight loss or gain: no Insomnia: yes hard to fall asleep Fatigue: yes Feelings of worthlessness or guilt: no Impaired concentration/indecisiveness: no Suicidal ideations: no Hopelessness: no Crying spells: no Depression screen [facility] 2/9 05/05/2018 03/24/2018 09/30/2017 01/01/2017 12/01/2016  Decreased Interest 0 2 1 0 3  Down, Depressed, Hopeless 0 2 0 0 3  PHQ - 2 Score 0 4 1 0 6    Altered sleeping 3 2 1  0 2  Tired, decreased energy 1 2 1 2 3   Change in appetite 1 2 0 0 2  Feeling bad or failure about yourself  0 2 1 0 2  Trouble concentrating 0 2 0 1 2  Moving slowly or fidgety/restless 0 0 0 0 0  Suicidal thoughts 0 0 0 0 0  PHQ-9 Score 5 14 4 3 17   Difficult doing work/chores Somewhat difficult Somewhat difficult Not difficult at all - -     Relevant past medical, surgical, family and social history reviewed and updated as indicated. Interim medical history since our last visit reviewed. Allergies and medications reviewed and updated.  Review of Systems  Constitutional: Negative.   Respiratory: Negative.   Cardiovascular: Negative.   Gastrointestinal: Negative.   Musculoskeletal: Negative.   Psychiatric/Behavioral: Negative.     Per HPI unless specifically indicated above     Objective:    BP 135/82   Pulse 86   Temp 98.4 F (36.9 C) (Oral)   Ht 5' 4.7" (1.643 m)   Wt 206 lb (93.4 kg)   SpO2 95%   BMI 34.60 kg/m   Wt Readings from Last 3 Encounters:  05/05/18 206 lb (93.4 kg)  03/24/18 205 lb 4 oz (93.1 kg)  09/30/17 201 lb 9.6 oz (91.4 kg)    Physical Exam  Constitutional: She is oriented to person, place, and time. She appears well-developed and well-nourished. No distress.  HENT:  Head: Normocephalic and atraumatic.  Right Ear: Hearing normal.  Left Ear: Hearing normal.  Nose: Nose normal.  Eyes: Conjunctivae and lids are normal. Right eye exhibits  no discharge. Left eye exhibits no discharge. No scleral icterus.  Cardiovascular: Normal rate, regular rhythm, normal heart sounds and intact distal pulses. Exam reveals no gallop and no friction rub.  No murmur heard. Pulmonary/Chest: Effort normal and breath sounds normal. No stridor. No respiratory distress. She has no wheezes. She has no rales. She exhibits no tenderness.  Musculoskeletal: Normal range of motion.  Neurological: She is alert and oriented to person, place, and time.   Skin: Skin is warm, dry and intact. Capillary refill takes less than 2 seconds. No rash noted. She is not diaphoretic. No erythema. No pallor.  Psychiatric: She has a normal mood and affect. Her speech is normal and behavior is normal. Judgment and thought content normal. Cognition and memory are normal.  Nursing note and vitals reviewed.   Results for orders placed or performed in visit on 03/24/18  Bayer DCA Hb A1c Waived  Result Value Ref Range   HB A1C (BAYER DCA - WAIVED) 9.1 (H) <7.0 %  CBC with Differential/Platelet  Result Value Ref Range   WBC 7.6 3.4 - 10.8 x10E3/uL   RBC 5.81 (H) 3.77 - 5.28 x10E6/uL   Hemoglobin 15.5 11.1 - 15.9 g/dL   Hematocrit 48.8 (H) 34.0 - 46.6 %   MCV 84 79 - 97 fL   MCH 26.7 26.6 - 33.0 pg   MCHC 31.8 31.5 - 35.7 g/dL   RDW 14.4 12.3 - 15.4 %   Platelets 147 (L) 150 - 450 x10E3/uL   Neutrophils 73 Not Estab. %   Lymphs 19 Not Estab. %   Monocytes 6 Not Estab. %   Eos 1 Not Estab. %   Basos 1 Not Estab. %   Neutrophils Absolute 5.5 1.4 - 7.0 x10E3/uL   Lymphocytes Absolute 1.5 0.7 - 3.1 x10E3/uL   Monocytes Absolute 0.5 0.1 - 0.9 x10E3/uL   EOS (ABSOLUTE) 0.1 0.0 - 0.4 x10E3/uL   Basophils Absolute 0.0 0.0 - 0.2 x10E3/uL   Immature Granulocytes 0 Not Estab. %   Immature Grans (Abs) 0.0 0.0 - 0.1 x10E3/uL  Comprehensive metabolic panel  Result Value Ref Range   Glucose 353 (H) 65 - 99 mg/dL   BUN 8 8 - 27 mg/dL   Creatinine, Ser 0.72 0.57 - 1.00 mg/dL   GFR calc non Af Amer 86 >59 mL/min/1.73   GFR calc Af Amer 99 >59 mL/min/1.73   BUN/Creatinine Ratio 11 (L) 12 - 28   Sodium 136 134 - 144 mmol/L   Potassium 4.6 3.5 - 5.2 mmol/L   Chloride 98 96 - 106 mmol/L   CO2 21 20 - 29 mmol/L   Calcium 8.9 8.7 - 10.3 mg/dL   Total Protein 6.0 6.0 - 8.5 g/dL   Albumin 4.0 3.6 - 4.8 g/dL   Globulin, Total 2.0 1.5 - 4.5 g/dL   Albumin/Globulin Ratio 2.0 1.2 - 2.2   Bilirubin Total 0.3 0.0 - 1.2 mg/dL   Alkaline Phosphatase 179 (H) 39 - 117 IU/L    AST 12 0 - 40 IU/L   ALT 12 0 - 32 IU/L  Lipid Panel w/o Chol/HDL Ratio  Result Value Ref Range   Cholesterol, Total 177 100 - 199 mg/dL   Triglycerides 256 (H) 0 - 149 mg/dL   HDL 34 (L) >39 mg/dL   VLDL Cholesterol Cal 51 (H) 5 - 40 mg/dL   LDL Calculated 92 0 - 99 mg/dL  Microalbumin, Urine Waived  Result Value Ref Range   Microalb, Ur Waived 80 (H) 0 -  19 mg/L   Creatinine, Urine Waived 50 10 - 300 mg/dL   Microalb/Creat Ratio >300 (H) <30 mg/g  TSH  Result Value Ref Range   TSH 2.350 0.450 - 4.500 uIU/mL  UA/M w/rflx Culture, Routine  Result Value Ref Range   Specific Gravity, UA 1.010 1.005 - 1.030   pH, UA 7.0 5.0 - 7.5   Color, UA Yellow Yellow   Appearance Ur Cloudy (A) Clear   Leukocytes, UA Negative Negative   Protein, UA Trace (A) Negative/Trace   Glucose, UA 3+ (A) Negative   Ketones, UA Negative Negative   RBC, UA Negative Negative   Bilirubin, UA Negative Negative   Urobilinogen, Ur 0.2 0.2 - 1.0 mg/dL   Nitrite, UA Negative Negative      Assessment & Plan:   Problem List Items Addressed This Visit      Endocrine   Diabetes mellitus with chronic kidney disease ([place])    Doing better on her current regimen. Continue to monitor. Call with any concerns.       Relevant Medications   Dulaglutide (TRULICITY) 1.5 ZJ/6.9CV SOPN   Other Relevant Orders   Comprehensive metabolic panel     Genitourinary   Hypertensive renal disease - Primary    Doing better on her current regimen. Continue to monitor. Call with any concerns.       Relevant Orders   Comprehensive metabolic panel     Other   Depression, major, single episode, moderate (HCC)    Doing better on her current regimen. Continue to monitor. Call with any concerns.       Relevant Medications   traZODone (DESYREL) 50 MG tablet   Insomnia    Will start trazodone. Call with any concerns. Continue to monitor.        Other Visit Diagnoses    Flu vaccine need       Flu shot given today.    Relevant Orders   Flu vaccine HIGH DOSE PF (Completed)       Follow up plan: Return End of October.

## 2018-05-06 LAB — COMPREHENSIVE METABOLIC PANEL
A/G RATIO: 2.2 (ref 1.2–2.2)
ALK PHOS: 143 IU/L — AB (ref 39–117)
ALT: 17 IU/L (ref 0–32)
AST: 14 IU/L (ref 0–40)
Albumin: 4.2 g/dL (ref 3.6–4.8)
BILIRUBIN TOTAL: 0.2 mg/dL (ref 0.0–1.2)
BUN/Creatinine Ratio: 12 (ref 12–28)
BUN: 9 mg/dL (ref 8–27)
CALCIUM: 8.7 mg/dL (ref 8.7–10.3)
CO2: 23 mmol/L (ref 20–29)
Chloride: 99 mmol/L (ref 96–106)
Creatinine, Ser: 0.75 mg/dL (ref 0.57–1.00)
GFR calc Af Amer: 94 mL/min/{1.73_m2} (ref >59)
GFR calc non Af Amer: 82 mL/min/{1.73_m2} (ref >59)
Globulin, Total: 1.9 g/dL (ref 1.5–4.5)
Glucose: 173 mg/dL — ABNORMAL HIGH (ref 65–99)
POTASSIUM: 4.2 mmol/L (ref 3.5–5.2)
Sodium: 140 mmol/L (ref 134–144)
Total Protein: 6.1 g/dL (ref 6.0–8.5)

## 2018-06-28 ENCOUNTER — Other Ambulatory Visit: Payer: Self-pay | Admitting: Family Medicine

## 2018-06-29 NOTE — Telephone Encounter (Signed)
Requested medication (s) are due for refill today: Yes  Requested medication (s) are on the active medication list: Yes  Last refill:  05/25/17  Future visit scheduled: Yes - Medicare wellness  Notes to clinic:  Rx has expired.    Requested Prescriptions  Pending Prescriptions Disp Refills   PROAIR HFA 108 (90 Base) MCG/ACT inhaler [Pharmacy Med Name: PROAIR HFA          AER] 9 each 3    Sig: INHALE 1 TO 2 PUFFS BY MOUTH EVERY 4 HOURS AS NEEDED FOR WHEEZING OR SHORTNESS OF BREATH     Pulmonology:  Beta Agonists Failed - 06/28/2018  8:30 PM      Failed - One inhaler should last at least one month. If the patient is requesting refills earlier, contact the patient to check for uncontrolled symptoms.      Passed - Valid encounter within last 12 months    Recent Outpatient Visits          1 month ago Hypertensive renal disease   [facility] [place], [name], DO   3 months ago Coronary artery disease involving native coronary artery of native heart without angina pectoris   [facility] [place], [name], DO   11 months ago Chronic obstructive pulmonary disease, unspecified COPD type [facility])   [facility] Volney American, PA-C   1 year ago Chronic obstructive pulmonary disease, unspecified COPD type [facility])   [place], [name], DO   1 year ago Type 2 diabetes mellitus with stage 3 chronic kidney disease, without long-term current use of insulin ([place])   [place], [name], DO      Future Appointments            In 3 months [place]

## 2018-06-30 ENCOUNTER — Ambulatory Visit: Payer: Medicare Other | Admitting: Family Medicine

## 2018-09-05 ENCOUNTER — Other Ambulatory Visit: Payer: Self-pay | Admitting: Family Medicine

## 2018-09-06 NOTE — Telephone Encounter (Signed)
Requested medication (s) are due for refill today: yes  Requested medication (s) are on the active medication list: yes  Last refill:  05/05/18  Future visit scheduled: yes  Notes to clinic:  Med for sleep   Requested Prescriptions  Pending Prescriptions Disp Refills   traZODone (DESYREL) 50 MG tablet [Pharmacy Med Name: traZODone HCl 50 MG Oral Tablet] 30 tablet 0    Sig: TAKE ONE-HALF TO ONE TABLET BY MOUTH AT BEDTIME AS NEEDED FOR SLEEP     Psychiatry: Antidepressants - Serotonin Modulator Passed - 09/05/2018  5:41 PM      Passed - Completed PHQ-2 or PHQ-9 in the last 360 days.      Passed - Valid encounter within last 6 months    Recent Outpatient Visits          4 months ago Hypertensive renal disease   [facility] [place], [name], DO   5 months ago Coronary artery disease involving native coronary artery of native heart without angina pectoris   [place], [name], DO   1 year ago Chronic obstructive pulmonary disease, unspecified COPD type [facility])   [facility] Volney American, PA-C   1 year ago Chronic obstructive pulmonary disease, unspecified COPD type [facility])   [place], [name], DO   1 year ago Type 2 diabetes mellitus with stage 3 chronic kidney disease, without long-term current use of insulin ([place])   [place], [name], DO      Future Appointments            In 3 weeks [facility]

## 2018-09-08 ENCOUNTER — Emergency Department: Payer: Medicare Other

## 2018-09-08 ENCOUNTER — Inpatient Hospital Stay
Admission: EM | Admit: 2018-09-08 | Discharge: 2018-09-14 | DRG: 418 | Disposition: A | Discharge status: Home / Self Care | Payer: Medicare Other | Attending: Internal Medicine | Admitting: Internal Medicine

## 2018-09-08 ENCOUNTER — Other Ambulatory Visit: Payer: Self-pay

## 2018-09-08 ENCOUNTER — Encounter: Payer: Self-pay | Admitting: *Deleted

## 2018-09-08 DIAGNOSIS — F17210 Nicotine dependence, cigarettes, uncomplicated: Secondary | ICD-10-CM | POA: Diagnosis present

## 2018-09-08 DIAGNOSIS — Z90710 Acquired absence of both cervix and uterus: Secondary | ICD-10-CM

## 2018-09-08 DIAGNOSIS — I252 Old myocardial infarction: Secondary | ICD-10-CM | POA: Diagnosis not present

## 2018-09-08 DIAGNOSIS — Z7982 Long term (current) use of aspirin: Secondary | ICD-10-CM

## 2018-09-08 DIAGNOSIS — Z888 Allergy status to other drugs, medicaments and biological substances status: Secondary | ICD-10-CM

## 2018-09-08 DIAGNOSIS — K859 Acute pancreatitis without necrosis or infection, unspecified: Secondary | ICD-10-CM | POA: Diagnosis not present

## 2018-09-08 DIAGNOSIS — R0602 Shortness of breath: Secondary | ICD-10-CM | POA: Diagnosis not present

## 2018-09-08 DIAGNOSIS — N189 Chronic kidney disease, unspecified: Secondary | ICD-10-CM | POA: Diagnosis present

## 2018-09-08 DIAGNOSIS — I130 Hypertensive heart and chronic kidney disease with heart failure and stage 1 through stage 4 chronic kidney disease, or unspecified chronic kidney disease: Secondary | ICD-10-CM | POA: Diagnosis present

## 2018-09-08 DIAGNOSIS — Z955 Presence of coronary angioplasty implant and graft: Secondary | ICD-10-CM

## 2018-09-08 DIAGNOSIS — E876 Hypokalemia: Secondary | ICD-10-CM | POA: Diagnosis not present

## 2018-09-08 DIAGNOSIS — R112 Nausea with vomiting, unspecified: Secondary | ICD-10-CM | POA: Diagnosis not present

## 2018-09-08 DIAGNOSIS — I774 Celiac artery compression syndrome: Secondary | ICD-10-CM | POA: Diagnosis not present

## 2018-09-08 DIAGNOSIS — G8929 Other chronic pain: Secondary | ICD-10-CM | POA: Diagnosis present

## 2018-09-08 DIAGNOSIS — E1122 Type 2 diabetes mellitus with diabetic chronic kidney disease: Secondary | ICD-10-CM | POA: Diagnosis present

## 2018-09-08 DIAGNOSIS — R0789 Other chest pain: Secondary | ICD-10-CM | POA: Diagnosis not present

## 2018-09-08 DIAGNOSIS — K851 Biliary acute pancreatitis without necrosis or infection: Secondary | ICD-10-CM | POA: Diagnosis not present

## 2018-09-08 DIAGNOSIS — E1165 Type 2 diabetes mellitus with hyperglycemia: Secondary | ICD-10-CM | POA: Diagnosis not present

## 2018-09-08 DIAGNOSIS — Z885 Allergy status to narcotic agent status: Secondary | ICD-10-CM

## 2018-09-08 DIAGNOSIS — K8020 Calculus of gallbladder without cholecystitis without obstruction: Secondary | ICD-10-CM | POA: Diagnosis not present

## 2018-09-08 DIAGNOSIS — E785 Hyperlipidemia, unspecified: Secondary | ICD-10-CM | POA: Diagnosis present

## 2018-09-08 DIAGNOSIS — I2510 Atherosclerotic heart disease of native coronary artery without angina pectoris: Secondary | ICD-10-CM | POA: Diagnosis present

## 2018-09-08 DIAGNOSIS — J449 Chronic obstructive pulmonary disease, unspecified: Secondary | ICD-10-CM | POA: Diagnosis not present

## 2018-09-08 DIAGNOSIS — I5020 Unspecified systolic (congestive) heart failure: Secondary | ICD-10-CM | POA: Diagnosis not present

## 2018-09-08 DIAGNOSIS — Z8249 Family history of ischemic heart disease and other diseases of the circulatory system: Secondary | ICD-10-CM

## 2018-09-08 DIAGNOSIS — R079 Chest pain, unspecified: Secondary | ICD-10-CM | POA: Diagnosis not present

## 2018-09-08 LAB — CBC
HCT: 48.4 % — ABNORMAL HIGH (ref 36.0–46.0)
Hemoglobin: 15.3 g/dL — ABNORMAL HIGH (ref 12.0–15.0)
MCH: 26.9 pg (ref 26.0–34.0)
MCHC: 31.6 g/dL (ref 30.0–36.0)
MCV: 85.2 fL (ref 80.0–100.0)
Platelets: 152 10*3/uL (ref 150–400)
RBC: 5.68 MIL/uL — ABNORMAL HIGH (ref 3.87–5.11)
RDW: 14.4 % (ref 11.5–15.5)
WBC: 10.2 10*3/uL (ref 4.0–10.5)
nRBC: 0 % (ref 0.0–0.2)

## 2018-09-08 LAB — HEPATIC FUNCTION PANEL
ALK PHOS: 170 U/L — AB (ref 38–126)
ALT: 48 U/L — ABNORMAL HIGH (ref 0–44)
AST: 86 U/L — ABNORMAL HIGH (ref 15–41)
Albumin: 3.8 g/dL (ref 3.5–5.0)
BILIRUBIN DIRECT: 0.3 mg/dL — AB (ref 0.0–0.2)
BILIRUBIN TOTAL: 0.9 mg/dL (ref 0.3–1.2)
Indirect Bilirubin: 0.6 mg/dL (ref 0.3–0.9)
Total Protein: 6.8 g/dL (ref 6.5–8.1)

## 2018-09-08 LAB — BASIC METABOLIC PANEL
Anion gap: 8 (ref 5–15)
BUN: 11 mg/dL (ref 8–23)
CO2: 24 mmol/L (ref 22–32)
Calcium: 8.8 mg/dL — ABNORMAL LOW (ref 8.9–10.3)
Chloride: 102 mmol/L (ref 98–111)
Creatinine, Ser: 0.7 mg/dL (ref 0.44–1.00)
GFR calc Af Amer: >60 mL/min (ref >60)
GFR calc non Af Amer: >60 mL/min (ref >60)
Glucose, Bld: 341 mg/dL — ABNORMAL HIGH (ref 70–99)
Potassium: 4.1 mmol/L (ref 3.5–5.1)
Sodium: 134 mmol/L — ABNORMAL LOW (ref 135–145)

## 2018-09-08 LAB — LIPASE, BLOOD: Lipase: 2993 U/L — ABNORMAL HIGH (ref 11–51)

## 2018-09-08 LAB — TROPONIN I: Troponin I: <0.03 ng/mL (ref <0.03)

## 2018-09-08 MED ORDER — ONDANSETRON HCL 4 MG/2ML IJ SOLN
4.0000 mg | Freq: Once | INTRAMUSCULAR | Status: AC
  Administered 2018-09-08: 4 mg via INTRAVENOUS
  Filled 2018-09-08: qty 2

## 2018-09-08 MED ORDER — FENTANYL CITRATE (PF) 100 MCG/2ML IJ SOLN
100.0000 ug | Freq: Once | INTRAMUSCULAR | Status: AC
  Administered 2018-09-08: 100 ug via INTRAVENOUS

## 2018-09-08 MED ORDER — FENTANYL CITRATE (PF) 100 MCG/2ML IJ SOLN
50.0000 ug | Freq: Once | INTRAMUSCULAR | Status: DC
  Filled 2018-09-08: qty 2

## 2018-09-08 MED ORDER — NITROGLYCERIN 0.4 MG SL SUBL
0.4000 mg | SUBLINGUAL_TABLET | SUBLINGUAL | Status: DC | PRN
  Administered 2018-09-08: 0.4 mg via SUBLINGUAL
  Filled 2018-09-08: qty 1

## 2018-09-08 MED ORDER — HYDROMORPHONE HCL 1 MG/ML IJ SOLN
1.0000 mg | Freq: Once | INTRAMUSCULAR | Status: AC
  Administered 2018-09-08: 1 mg via INTRAVENOUS

## 2018-09-08 MED ORDER — ONDANSETRON HCL 4 MG/2ML IJ SOLN
INTRAMUSCULAR | Status: AC
  Filled 2018-09-08: qty 2

## 2018-09-08 MED ORDER — HYDROMORPHONE HCL 1 MG/ML IJ SOLN
INTRAMUSCULAR | Status: AC
  Filled 2018-09-08: qty 1

## 2018-09-08 MED ORDER — ONDANSETRON HCL 4 MG/2ML IJ SOLN
4.0000 mg | Freq: Once | INTRAMUSCULAR | Status: AC
  Administered 2018-09-08: 4 mg via INTRAVENOUS

## 2018-09-08 MED ORDER — SODIUM CHLORIDE 0.9 % IV SOLN
Freq: Once | INTRAVENOUS | Status: AC
  Administered 2018-09-09: 02:00:00 via INTRAVENOUS

## 2018-09-08 MED ORDER — IOHEXOL 350 MG/ML SOLN
100.0000 mL | Freq: Once | INTRAVENOUS | Status: AC | PRN
  Administered 2018-09-08: 100 mL via INTRAVENOUS

## 2018-09-08 NOTE — ED Notes (Signed)
Patient transported to Ultrasound 

## 2018-09-08 NOTE — ED Triage Notes (Signed)
Pt to ED reporting centralized chest pain sharp in nature that started 3 hours ago. No SOB or dizziness but vomiting and nausea x 3 hours as well. PT is yelling out in pain in triage. Hx of MI but pt is unsure what year.

## 2018-09-08 NOTE — ED Provider Notes (Signed)
[facility] Emergency Department Provider Note  Time seen: 8:43 PM  I have reviewed the triage vital signs and the nursing notes.   HISTORY  Chief Complaint Chest Pain    HPI Sarah White is a 73 y.o. female with a past medical history of CHF, COPD, diabetes, hypertension, prior MI, presents to the emergency department with acute onset of abdominal and chest pain.  According to the patient approximately 3 hours ago she developed sharp pains in her chest and abdomen radiating to her back.  Patient states currently severe pain, she appears quite uncomfortable, has been nauseated with vomiting.  Denies any significant shortness of breath.  Patient states a prior MI but states this feels completely different.  Past Medical History:  Diagnosis Date  . Acute respiratory failure with hypoxia and hypercarbia ([place]) 07/22/2015  . Cancer ([place])    uterine  . CHF (congestive heart failure) ([place])   . COPD (chronic obstructive pulmonary disease) ([place])   . Diabetes mellitus without complication ([place])   . Encephalopathy acute 07/22/2015  . Hypertension   . Pneumonia    November 2016  . Pulmonary edema 07/22/2015  . 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 Insomnia 05/05/2018  . Squamous cell carcinoma of arm, right 01/07/2017  . Depression, major, single episode, moderate ([place]) 12/01/2016  . Unstable angina ([place]) 11/26/2016  . Near syncope 03/11/2016  . Hypokalemia 03/11/2016  . Orthostatic hypotension 08/12/2015  . Hyperlipidemia 07/29/2015  . Acute systolic CHF (congestive heart failure) ([place]) 07/25/2015  . Hypertensive renal disease 07/25/2015  . Low back pain 07/25/2015  . Smoker 07/22/2015  . COPD (chronic obstructive pulmonary disease) ([place]) 07/22/2015  . Diabetes mellitus with chronic kidney disease ([place]) 07/22/2015  . CAD (coronary artery disease) 07/22/2015    Past Surgical History:   Procedure Laterality Date  . ABDOMINAL HYSTERECTOMY    . CARPAL TUNNEL RELEASE Bilateral   . CESAREAN SECTION    . CORONARY ANGIOPLASTY WITH STENT PLACEMENT    . KNEE SURGERY Left   . LEFT HEART CATH AND CORONARY ANGIOGRAPHY Right 11/26/2016   Procedure: Left Heart Cath and Coronary Angiography;  Surgeon: Isaias Cowman, MD;  Location: [place];  Service: Cardiovascular;  Laterality: Right;    Prior to Admission medications   Medication Sig Start Date End Date Taking? Authorizing Provider  aspirin EC 81 MG tablet Take 81 mg by mouth daily.    [provider]  atorvastatin (LIPITOR) 40 MG tablet Take 1 tablet (40 mg total) by mouth daily. 03/24/18   Johnson, [name], DO  carvedilol (COREG) 12.5 MG tablet TAKE TWO TABLETS BY MOUTH TWICE DAILY WITH A MEAL 03/24/18   Johnson, [name], DO  docusate sodium (COLACE) 100 MG capsule Take 100 mg by mouth daily.     [provider]  Dulaglutide (TRULICITY) 1.5 HY/0.7PX SOPN Inject 1.5 mg into the skin once a week. 05/05/18   Johnson, [name], DO  furosemide (LASIX) 40 MG tablet Take 1 tablet (40 mg total) by mouth daily. 03/24/18   Johnson, [name], DO  ibuprofen (ADVIL,MOTRIN) 800 MG tablet Take 1 tablet (800 mg total) by mouth every 8 (eight) hours as needed. 08/19/17   Lisa Roca, MD  isosorbide mononitrate (IMDUR) 60 MG 24 hr tablet Take 1 tablet (60 mg total) by mouth daily. 03/24/18   [place] Liter P, DO  meclizine (  ANTIVERT) 12.5 MG tablet Take 1 tablet (12.5 mg total) by mouth 3 (three) times daily as needed for dizziness. 03/17/16   Volney American, PA-C  Multiple Vitamin (MULTIVITAMIN WITH MINERALS) TABS tablet Take 1 tablet by mouth daily.    [provider]  nitroGLYCERIN (NITROSTAT) 0.4 MG SL tablet Place 1 tablet (0.4 mg total) under the tongue every 5 (five) minutes as needed for chest pain. 03/24/18   Johnson, [name], DO  ondansetron (ZOFRAN) 4 MG tablet Take 1 tablet (4 mg total) by mouth  every 8 (eight) hours as needed for nausea or vomiting. 08/19/17   Lisa Roca, MD  ondansetron (ZOFRAN-ODT) 4 MG disintegrating tablet Take 4 mg by mouth every 8 (eight) hours as needed for nausea or vomiting.    [provider]  PROAIR HFA 108 (90 Base) MCG/ACT inhaler INHALE 1 TO 2 PUFFS BY MOUTH EVERY 4 HOURS AS NEEDED FOR WHEEZING OR SHORTNESS OF BREATH 06/29/18   Park Liter P, DO  sertraline (ZOLOFT) 100 MG tablet Take one pill daily 03/24/18   Wynetta Emery, [name], DO  tiotropium (SPIRIVA HANDIHALER) 18 MCG inhalation capsule Place 1 capsule (18 mcg total) into inhaler and inhale daily. 03/24/18   Johnson, [name], DO  traZODone (DESYREL) 50 MG tablet TAKE ONE-HALF TO ONE TABLET BY MOUTH AT BEDTIME AS NEEDED FOR SLEEP 09/06/18   Park Liter P, DO    Allergies  Allergen Reactions  . Amlodipine Swelling  . Codeine Nausea And Vomiting  . Hctz [Hydrochlorothiazide] Other (See Comments)    Hypokalemia  . Lisinopril Other (See Comments)    Dizziness   . Metformin And Related Nausea Only    Nausea, dizziness    Family History  Problem Relation Age of Onset  . Heart disease Mother     Social History Social History   Tobacco Use  . Smoking status: Current Every Day Smoker    Packs/day: 0.50    Types: Cigarettes    Last attempt to quit: 07/17/2015    Years since quitting: 3.1  . Smokeless tobacco: Never Used  Substance Use Topics  . Alcohol use: No  . Drug use: No    Review of Systems Constitutional: Negative for fever. Cardiovascular: Positive for sharp chest pain radiating to her back Respiratory: Negative for shortness of breath. Gastrointestinal: Positive for sharp abdominal pain radiating to her back.  Positive for nausea vomiting. Musculoskeletal: Negative for musculoskeletal complaints Skin: Negative for skin complaints  Neurological: Negative for headache All other ROS negative  ____________________________________________   PHYSICAL EXAM:  VITAL  SIGNS: ED Triage Vitals  Enc Vitals Group     BP 09/08/18 2010 (!) 148/102     Pulse Rate 09/08/18 2010 68     Resp 09/08/18 2010 20     Temp 09/08/18 2010 98.4 F (36.9 C)     Temp Source 09/08/18 2010 Oral     SpO2 09/08/18 2010 94 %     Weight 09/08/18 2008 205 lb 14.6 oz (93.4 kg)     Height --      Head Circumference --      Peak Flow --      Pain Score 09/08/18 2007 10     Pain Loc --      Pain Edu? --      Excl. in [place]? --    Constitutional: Mild distress due to abdominal and chest discomfort. Eyes: Normal exam ENT   Head: Normocephalic and atraumatic.   Mouth/Throat: Mucous membranes  are moist. Cardiovascular: Normal rate, regular rhythm. No murmur Respiratory: Normal respiratory effort without tachypnea nor retractions. Breath sounds are clear  Gastrointestinal: Soft, mild to moderate epigastric to mid abdominal tenderness palpation.  No obvious rebound or guarding.  No CVA tenderness. Musculoskeletal: Nontender with normal range of motion in all extremities.  Neurologic:  Normal speech and language. No gross focal neurologic deficits  Skin:  Skin is warm, dry and intact.  Psychiatric: Mood and affect are normal.   ____________________________________________    EKG  EKG viewed and interpreted by myself shows a normal sinus rhythm at 63 bpm with a narrow QRS, normal axis, normal intervals.  Patient does have mild ST depression in the lateral leads.  No ST elevation.  Compared to prior EKG patient appears to have ST changes in the lateral leads at that time as well.  ____________________________________________    RADIOLOGY  IMPRESSION: CTA of the chest: No evidence of dissection is identified. No aneurysmal dilatation is seen.  Atherosclerotic plaque with mild ulcerations are noted particularly in the descending thoracic aorta.  Emphysematous changes without acute parenchymal abnormality.  CTA of the abdomen and pelvis: Changes consistent with mild  acute pancreatitis. No pseudocyst is identified.  Significant calcified and noncalcified atherosclerotic plaque within the abdominal aorta. No aneurysmal dilatation is seen.  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  Dependent density within the gallbladder consistent with small stones or gallbladder sludge.  Previously seen left UPJ stone is no longer identified.  IMPRESSION: 1. No radiographic evidence of acute cardiopulmonary disease. 2. Aortic atherosclerosis.  ____________________________________________   INITIAL IMPRESSION / ASSESSMENT AND PLAN / ED COURSE  Pertinent labs & imaging results that were available during my care of the patient were reviewed by me and considered in my medical decision making (see chart for details).  Patient presents to the emergency department with acute onset approximately 3 hours ago of sharp chest and abdominal pains.  Patient appears quite uncomfortable during examination, also complaining of nausea.  Patient tried nitroglycerin at home without relief.  We will dose IV fentanyl in the emergency department.  Given the patient's concerning symptoms of sharp chest and abdominal pain radiating to her back and acute onset we will obtain CTA imaging to rule out dissection.  I reviewed the patient's past labs no history of renal dysfunction we will not wait for lab results today.  We will check labs including hepatic function panel, lipase and troponin.  Differential at this time would include dissection, aneurysm, ACS, PE, pancreatitis, other intra-abdominal pathology.  CT scan negative for dissection, however does show possibility of acute pancreatitis.  Patient's labs show mild LFT elevation, right upper quadrant ultrasound has been added. Patient's ultrasound shows gallstones without gallbladder wall thickening negative Murphy sign does not appear to be overly concerning for acute cholecystitis.  Patient's lab work has resulted also showing a  lipase of 2900 likely pancreatitis.  Normal CBD on ultrasound.  Patient will be admitted to the hospital service for ongoing treatment.  Pain is better after Dilaudid but still continues to have abdominal pain.  ____________________________________________   FINAL CLINICAL IMPRESSION(S) / ED DIAGNOSES  Chest pain Abdominal pain Acute pancreatitis   Harvest Dark, MD 09/08/18 2352

## 2018-09-08 NOTE — ED Notes (Signed)
Patient transported to CT 

## 2018-09-08 NOTE — ED Notes (Signed)
Pt ambulatory to toilet independently. 

## 2018-09-09 ENCOUNTER — Other Ambulatory Visit: Payer: Self-pay

## 2018-09-09 DIAGNOSIS — J449 Chronic obstructive pulmonary disease, unspecified: Secondary | ICD-10-CM | POA: Diagnosis present

## 2018-09-09 DIAGNOSIS — Z885 Allergy status to narcotic agent status: Secondary | ICD-10-CM | POA: Diagnosis not present

## 2018-09-09 DIAGNOSIS — I110 Hypertensive heart disease with heart failure: Secondary | ICD-10-CM | POA: Diagnosis not present

## 2018-09-09 DIAGNOSIS — E119 Type 2 diabetes mellitus without complications: Secondary | ICD-10-CM | POA: Diagnosis not present

## 2018-09-09 DIAGNOSIS — I130 Hypertensive heart and chronic kidney disease with heart failure and stage 1 through stage 4 chronic kidney disease, or unspecified chronic kidney disease: Secondary | ICD-10-CM | POA: Diagnosis present

## 2018-09-09 DIAGNOSIS — N189 Chronic kidney disease, unspecified: Secondary | ICD-10-CM | POA: Diagnosis present

## 2018-09-09 DIAGNOSIS — K8010 Calculus of gallbladder with chronic cholecystitis without obstruction: Secondary | ICD-10-CM | POA: Diagnosis not present

## 2018-09-09 DIAGNOSIS — Z01810 Encounter for preprocedural cardiovascular examination: Secondary | ICD-10-CM | POA: Diagnosis not present

## 2018-09-09 DIAGNOSIS — I252 Old myocardial infarction: Secondary | ICD-10-CM | POA: Diagnosis not present

## 2018-09-09 DIAGNOSIS — E785 Hyperlipidemia, unspecified: Secondary | ICD-10-CM | POA: Diagnosis present

## 2018-09-09 DIAGNOSIS — K8020 Calculus of gallbladder without cholecystitis without obstruction: Secondary | ICD-10-CM | POA: Diagnosis not present

## 2018-09-09 DIAGNOSIS — K859 Acute pancreatitis without necrosis or infection, unspecified: Secondary | ICD-10-CM | POA: Diagnosis not present

## 2018-09-09 DIAGNOSIS — I5021 Acute systolic (congestive) heart failure: Secondary | ICD-10-CM | POA: Diagnosis not present

## 2018-09-09 DIAGNOSIS — I2510 Atherosclerotic heart disease of native coronary artery without angina pectoris: Secondary | ICD-10-CM | POA: Diagnosis present

## 2018-09-09 DIAGNOSIS — F17210 Nicotine dependence, cigarettes, uncomplicated: Secondary | ICD-10-CM | POA: Diagnosis present

## 2018-09-09 DIAGNOSIS — E876 Hypokalemia: Secondary | ICD-10-CM | POA: Diagnosis not present

## 2018-09-09 DIAGNOSIS — Z888 Allergy status to other drugs, medicaments and biological substances status: Secondary | ICD-10-CM | POA: Diagnosis not present

## 2018-09-09 DIAGNOSIS — K851 Biliary acute pancreatitis without necrosis or infection: Secondary | ICD-10-CM | POA: Diagnosis present

## 2018-09-09 DIAGNOSIS — Z955 Presence of coronary angioplasty implant and graft: Secondary | ICD-10-CM | POA: Diagnosis not present

## 2018-09-09 DIAGNOSIS — Z8249 Family history of ischemic heart disease and other diseases of the circulatory system: Secondary | ICD-10-CM | POA: Diagnosis not present

## 2018-09-09 DIAGNOSIS — G8929 Other chronic pain: Secondary | ICD-10-CM | POA: Diagnosis present

## 2018-09-09 DIAGNOSIS — E1122 Type 2 diabetes mellitus with diabetic chronic kidney disease: Secondary | ICD-10-CM | POA: Diagnosis present

## 2018-09-09 DIAGNOSIS — Z7982 Long term (current) use of aspirin: Secondary | ICD-10-CM | POA: Diagnosis not present

## 2018-09-09 DIAGNOSIS — R079 Chest pain, unspecified: Secondary | ICD-10-CM | POA: Diagnosis not present

## 2018-09-09 DIAGNOSIS — I5020 Unspecified systolic (congestive) heart failure: Secondary | ICD-10-CM | POA: Diagnosis present

## 2018-09-09 DIAGNOSIS — Z90710 Acquired absence of both cervix and uterus: Secondary | ICD-10-CM | POA: Diagnosis not present

## 2018-09-09 HISTORY — DX: Acute pancreatitis without necrosis or infection, unspecified: K85.90

## 2018-09-09 LAB — CBC
HCT: 45.7 % (ref 36.0–46.0)
Hemoglobin: 14.3 g/dL (ref 12.0–15.0)
MCH: 27 pg (ref 26.0–34.0)
MCHC: 31.3 g/dL (ref 30.0–36.0)
MCV: 86.2 fL (ref 80.0–100.0)
Platelets: 143 10*3/uL — ABNORMAL LOW (ref 150–400)
RBC: 5.3 MIL/uL — ABNORMAL HIGH (ref 3.87–5.11)
RDW: 14.4 % (ref 11.5–15.5)
WBC: 10.1 10*3/uL (ref 4.0–10.5)
nRBC: 0 % (ref 0.0–0.2)

## 2018-09-09 LAB — LIPASE, BLOOD: LIPASE: 523 U/L — AB (ref 11–51)

## 2018-09-09 LAB — BASIC METABOLIC PANEL
Anion gap: 6 (ref 5–15)
BUN: 11 mg/dL (ref 8–23)
CO2: 26 mmol/L (ref 22–32)
Calcium: 8.4 mg/dL — ABNORMAL LOW (ref 8.9–10.3)
Chloride: 105 mmol/L (ref 98–111)
Creatinine, Ser: 0.63 mg/dL (ref 0.44–1.00)
GFR calc Af Amer: >60 mL/min (ref >60)
GFR calc non Af Amer: >60 mL/min (ref >60)
Glucose, Bld: 275 mg/dL — ABNORMAL HIGH (ref 70–99)
Potassium: 4.1 mmol/L (ref 3.5–5.1)
SODIUM: 137 mmol/L (ref 135–145)

## 2018-09-09 LAB — GLUCOSE, CAPILLARY
Glucose-Capillary: 252 mg/dL — ABNORMAL HIGH (ref 70–99)
Glucose-Capillary: 162 mg/dL — ABNORMAL HIGH (ref 70–99)
Glucose-Capillary: 189 mg/dL — ABNORMAL HIGH (ref 70–99)

## 2018-09-09 MED ORDER — ACETAMINOPHEN 325 MG PO TABS
650.0000 mg | ORAL_TABLET | Freq: Four times a day (QID) | ORAL | Status: DC | PRN
  Administered 2018-09-10 – 2018-09-11 (×3): 650 mg via ORAL
  Filled 2018-09-09 (×3): qty 2

## 2018-09-09 MED ORDER — CARVEDILOL 25 MG PO TABS
25.0000 mg | ORAL_TABLET | Freq: Two times a day (BID) | ORAL | Status: DC
  Administered 2018-09-10 – 2018-09-14 (×9): 25 mg via ORAL
  Filled 2018-09-09 (×9): qty 1

## 2018-09-09 MED ORDER — SODIUM CHLORIDE 0.9 % IV SOLN
INTRAVENOUS | Status: AC
  Administered 2018-09-09 – 2018-09-10 (×3): via INTRAVENOUS

## 2018-09-09 MED ORDER — TIOTROPIUM BROMIDE MONOHYDRATE 18 MCG IN CAPS
18.0000 ug | ORAL_CAPSULE | Freq: Every day | RESPIRATORY_TRACT | Status: DC
  Administered 2018-09-09 – 2018-09-14 (×4): 18 ug via RESPIRATORY_TRACT
  Filled 2018-09-09: qty 5

## 2018-09-09 MED ORDER — HYDROMORPHONE HCL 1 MG/ML IJ SOLN
INTRAMUSCULAR | Status: AC
  Filled 2018-09-09: qty 1

## 2018-09-09 MED ORDER — ONDANSETRON HCL 4 MG PO TABS
4.0000 mg | ORAL_TABLET | Freq: Four times a day (QID) | ORAL | Status: DC | PRN
  Administered 2018-09-09: 4 mg via ORAL
  Filled 2018-09-09 (×2): qty 1

## 2018-09-09 MED ORDER — FENTANYL CITRATE (PF) 100 MCG/2ML IJ SOLN
100.0000 ug | Freq: Once | INTRAMUSCULAR | Status: AC
  Administered 2018-09-09: 100 ug via INTRAVENOUS
  Filled 2018-09-09: qty 2

## 2018-09-09 MED ORDER — ONDANSETRON HCL 4 MG/2ML IJ SOLN
INTRAMUSCULAR | Status: AC
  Filled 2018-09-09: qty 2

## 2018-09-09 MED ORDER — ISOSORBIDE MONONITRATE ER 30 MG PO TB24
60.0000 mg | ORAL_TABLET | Freq: Every day | ORAL | Status: DC
  Administered 2018-09-10 – 2018-09-14 (×4): 60 mg via ORAL
  Filled 2018-09-09 (×4): qty 2

## 2018-09-09 MED ORDER — ENOXAPARIN SODIUM 40 MG/0.4ML ~~LOC~~ SOLN
40.0000 mg | SUBCUTANEOUS | Status: DC
  Administered 2018-09-09 – 2018-09-14 (×5): 40 mg via SUBCUTANEOUS
  Filled 2018-09-09 (×5): qty 0.4

## 2018-09-09 MED ORDER — ACETAMINOPHEN 650 MG RE SUPP: 650.0000 mg | Freq: Four times a day (QID) | RECTAL | Status: DC | PRN

## 2018-09-09 MED ORDER — CARVEDILOL 25 MG PO TABS
25.0000 mg | ORAL_TABLET | Freq: Once | ORAL | Status: AC
  Administered 2018-09-09: 25 mg via ORAL
  Filled 2018-09-09: qty 1

## 2018-09-09 MED ORDER — ENOXAPARIN SODIUM 40 MG/0.4ML ~~LOC~~ SOLN: 40.0000 mg | SUBCUTANEOUS | Status: DC

## 2018-09-09 MED ORDER — FUROSEMIDE 40 MG PO TABS
40.0000 mg | ORAL_TABLET | Freq: Two times a day (BID) | ORAL | Status: DC
  Administered 2018-09-10 – 2018-09-14 (×8): 40 mg via ORAL
  Filled 2018-09-09 (×8): qty 1

## 2018-09-09 MED ORDER — OXYCODONE HCL 5 MG PO TABS
5.0000 mg | ORAL_TABLET | Freq: Four times a day (QID) | ORAL | Status: DC | PRN
  Administered 2018-09-09 – 2018-09-14 (×13): 5 mg via ORAL
  Filled 2018-09-09 (×13): qty 1

## 2018-09-09 MED ORDER — INSULIN ASPART 100 UNIT/ML ~~LOC~~ SOLN
0.0000 [IU] | Freq: Four times a day (QID) | SUBCUTANEOUS | Status: DC
  Administered 2018-09-09: 5 [IU] via SUBCUTANEOUS
  Administered 2018-09-09 (×2): 2 [IU] via SUBCUTANEOUS
  Administered 2018-09-10 – 2018-09-11 (×3): 1 [IU] via SUBCUTANEOUS
  Administered 2018-09-12: 3 [IU] via SUBCUTANEOUS
  Administered 2018-09-12 (×2): 1 [IU] via SUBCUTANEOUS
  Administered 2018-09-13: 3 [IU] via SUBCUTANEOUS
  Administered 2018-09-13: 1 [IU] via SUBCUTANEOUS
  Administered 2018-09-13 (×2): 2 [IU] via SUBCUTANEOUS
  Administered 2018-09-14 (×2): 1 [IU] via SUBCUTANEOUS
  Filled 2018-09-09 (×15): qty 1

## 2018-09-09 MED ORDER — INSULIN ASPART 100 UNIT/ML ~~LOC~~ SOLN: 0.0000 [IU] | Freq: Four times a day (QID) | SUBCUTANEOUS | Status: DC

## 2018-09-09 MED ORDER — ONDANSETRON HCL 4 MG/2ML IJ SOLN
4.0000 mg | Freq: Four times a day (QID) | INTRAMUSCULAR | Status: DC | PRN
  Administered 2018-09-10 – 2018-09-11 (×3): 4 mg via INTRAVENOUS
  Filled 2018-09-09 (×3): qty 2

## 2018-09-09 MED ORDER — ISOSORBIDE MONONITRATE ER 60 MG PO TB24
60.0000 mg | ORAL_TABLET | Freq: Once | ORAL | Status: AC
  Administered 2018-09-09: 60 mg via ORAL
  Filled 2018-09-09: qty 1

## 2018-09-09 MED ORDER — HYDROMORPHONE HCL 1 MG/ML IJ SOLN
0.5000 mg | INTRAMUSCULAR | Status: DC | PRN
  Administered 2018-09-09 – 2018-09-13 (×10): 0.5 mg via INTRAVENOUS
  Filled 2018-09-09 (×10): qty 1

## 2018-09-09 NOTE — ED Notes (Signed)
Patient transported to the restroom.

## 2018-09-09 NOTE — ED Notes (Signed)
Patient resting in bed with eyes closed. Respirations even and non labored. Will continue to monitor.

## 2018-09-09 NOTE — ED Notes (Signed)
ED TO INPATIENT HANDOFF REPORT  Name/Age/Gender Sarah White 73 y.o. female  Code Status    Code Status Orders  (From admission, onward)         Start     Ordered   09/09/18 0638  Full code  Continuous     09/09/18 0637        Code Status History    Date Active Date Inactive Code Status Order ID Comments User Context   11/26/2016 0402 11/27/2016 1810 DNR [id number]  Harvie Bridge, DO Inpatient   03/11/2016 1856 03/12/2016 1303 DNR [id number]  Demetrios Loll, MD Inpatient   07/22/2015 1242 07/25/2015 2149 Partial Code [id number]  Wilhelmina Mcardle, MD Inpatient   07/17/2015 2119 07/22/2015 1242 DNR [id number]  Nicholes Mango, MD Inpatient      Home/SNF/Other Home  Chief Complaint Chest Pain  Level of Care/Admitting Diagnosis ED Disposition    ED Disposition Condition [facility] Area: [place] [[id number]]  Level of Care: Med-Surg [16]  Diagnosis: Acute pancreatitis [577.0.ICD-9-CM]  Admitting Physician: Lance Coon [[id number]]  Attending Physician: Lance Coon [phone]  Estimated length of stay: past midnight tomorrow  Certification:: I certify this patient will need inpatient services for at least 2 midnights  PT Class (Do Not Modify): Inpatient [101]  PT Acc Code (Do Not Modify): Private [1]       Medical History Past Medical History:  Diagnosis Date  . Acute respiratory failure with hypoxia and hypercarbia ([place]) 07/22/2015  . Cancer ([place])    uterine  . CHF (congestive heart failure) ([place])   . COPD (chronic obstructive pulmonary disease) ([place])   . Diabetes mellitus without complication ([place])   . Encephalopathy acute 07/22/2015  . Hypertension   . Pneumonia    November 2016  . Pulmonary edema 07/22/2015  . Squamous cell cancer of skin of upper arm, right     Allergies Allergies  Allergen Reactions  . Amlodipine Swelling  . Codeine Nausea And Vomiting  . Hctz [Hydrochlorothiazide] Other (See Comments)     Hypokalemia  . Lisinopril Other (See Comments)    Dizziness   . Metformin And Related Nausea Only    Nausea, dizziness    IV Location/Drains/Wounds Patient Lines/Drains/Airways Status   Active Line/Drains/Airways    Name:   Placement date:   Placement time:   Site:   Days:   Peripheral IV 09/08/18 Right Forearm   09/08/18    2030    Forearm   1          Labs/Imaging Results for orders placed or performed during the hospital encounter of 09/08/18 (from the past 48 hour(s))  Basic metabolic panel     Status: Abnormal   Collection Time: 09/08/18  8:29 PM  Result Value Ref Range   Sodium 134 (L) 135 - 145 mmol/L   Potassium 4.1 3.5 - 5.1 mmol/L   Chloride 102 98 - 111 mmol/L   CO2 24 22 - 32 mmol/L   Glucose, Bld 341 (H) 70 - 99 mg/dL   BUN 11 8 - 23 mg/dL   Creatinine, Ser 0.70 0.44 - 1.00 mg/dL   Calcium 8.8 (L) 8.9 - 10.3 mg/dL   GFR calc non Af Amer >60 >60 mL/min   GFR calc Af Amer >60 >60 mL/min   Anion gap 8 5 - 15    Comment: Performed at [facility], [address], [place], [place] [address]  CBC     Status: Abnormal  Collection Time: 09/08/18  8:29 PM  Result Value Ref Range   WBC 10.2 4.0 - 10.5 K/uL   RBC 5.68 (H) 3.87 - 5.11 MIL/uL   Hemoglobin 15.3 (H) 12.0 - 15.0 g/dL   HCT 48.4 (H) 36.0 - 46.0 %   MCV 85.2 80.0 - 100.0 fL   MCH 26.9 26.0 - 34.0 pg   MCHC 31.6 30.0 - 36.0 g/dL   RDW 14.4 11.5 - 15.5 %   Platelets 152 150 - 400 K/uL   nRBC 0.0 0.0 - 0.2 %    Comment: Performed at [facility], [address] [place], [place] [address]  Troponin I - ONCE - STAT     Status: None   Collection Time: 09/08/18  8:29 PM  Result Value Ref Range   Troponin I <0.03 <0.03 ng/mL    Comment: Performed at [facility], [address] [place], [place] [address]  Lipase, blood     Status: Abnormal   Collection Time: 09/08/18  8:29 PM  Result Value Ref Range   Lipase 2,993 (H) 11 - 51 U/L    Comment: RESULT CONFIRMED BY MANUAL  DILUTION.  TFK Performed at [facility], [address] [place], [place] [address]   Hepatic function panel     Status: Abnormal   Collection Time: 09/08/18  8:29 PM  Result Value Ref Range   Total Protein 6.8 6.5 - 8.1 g/dL   Albumin 3.8 3.5 - 5.0 g/dL   AST 86 (H) 15 - 41 U/L   ALT 48 (H) 0 - 44 U/L   Alkaline Phosphatase 170 (H) 38 - 126 U/L   Total Bilirubin 0.9 0.3 - 1.2 mg/dL   Bilirubin, Direct 0.3 (H) 0.0 - 0.2 mg/dL   Indirect Bilirubin 0.6 0.3 - 0.9 mg/dL    Comment: Performed at [facility], [address] [place], [place] [address]  CBC     Status: Abnormal   Collection Time: 09/09/18  6:57 AM  Result Value Ref Range   WBC 10.1 4.0 - 10.5 K/uL   RBC 5.30 (H) 3.87 - 5.11 MIL/uL   Hemoglobin 14.3 12.0 - 15.0 g/dL   HCT 45.7 36.0 - 46.0 %   MCV 86.2 80.0 - 100.0 fL   MCH 27.0 26.0 - 34.0 pg   MCHC 31.3 30.0 - 36.0 g/dL   RDW 14.4 11.5 - 15.5 %   Platelets 143 (L) 150 - 400 K/uL   nRBC 0.0 0.0 - 0.2 %    Comment: Performed at [facility], [address] [place], [place] [address]  Basic metabolic panel     Status: Abnormal   Collection Time: 09/09/18  6:57 AM  Result Value Ref Range   Sodium 137 135 - 145 mmol/L   Potassium 4.1 3.5 - 5.1 mmol/L   Chloride 105 98 - 111 mmol/L   CO2 26 22 - 32 mmol/L   Glucose, Bld 275 (H) 70 - 99 mg/dL   BUN 11 8 - 23 mg/dL   Creatinine, Ser 0.63 0.44 - 1.00 mg/dL   Calcium 8.4 (L) 8.9 - 10.3 mg/dL   GFR calc non Af Amer >60 >60 mL/min   GFR calc Af Amer >60 >60 mL/min   Anion gap 6 5 - 15    Comment: Performed at [facility], [address] [place], [place] [address]  Lipase, blood     Status: Abnormal   Collection Time: 09/09/18  6:57 AM  Result Value  Ref Range   Lipase 523 (H) 11 - 51 U/L    Comment: RESULT CONFIRMED BY MANUAL DILUTION DAS Performed at [facility], [address] [place], [place] [address]   Glucose, capillary     Status: Abnormal   Collection  Time: 09/09/18  7:39 AM  Result Value Ref Range   Glucose-Capillary 252 (H) 70 - 99 mg/dL  Glucose, capillary     Status: Abnormal   Collection Time: 09/09/18 11:39 AM  Result Value Ref Range   Glucose-Capillary 162 (H) 70 - 99 mg/dL   Dg Chest Portable 1 View  Result Date: 09/08/2018 CLINICAL DATA:  73 year old female with history of central chest pain which is sharp, starting 3 hours ago. No associated shortness of breath or dizziness. Vomiting and nausea for the past 3 hours. EXAM: PORTABLE CHEST 1 VIEW COMPARISON:  Chest x-ray 11/26/2016. FINDINGS: Lung volumes are normal. No consolidative airspace disease. No pleural effusions. No pneumothorax. No pulmonary nodule or mass noted. Pulmonary vasculature and the cardiomediastinal silhouette are within normal limits. Aortic atherosclerosis. IMPRESSION: 1.  No radiographic evidence of acute cardiopulmonary disease. 2. Aortic atherosclerosis. Electronically Signed   By: Vinnie Langton M.D.   On: 09/08/2018 20:24   Ct Angio Chest/abd/pel For Dissection W And/or Wo Contrast  Result Date: 09/08/2018 CLINICAL DATA:  Central chest pain for several hours EXAM: CT ANGIOGRAPHY CHEST, ABDOMEN AND PELVIS TECHNIQUE: Multidetector CT imaging through the chest, abdomen and pelvis was performed using the standard protocol during bolus administration of intravenous contrast. Multiplanar reconstructed images and MIPs were obtained and reviewed to evaluate the vascular anatomy. CONTRAST:  159mL OMNIPAQUE IOHEXOL 350 MG/ML SOLN COMPARISON:  08/19/2017 FINDINGS: CTA CHEST FINDINGS Cardiovascular: Precontrast images demonstrate no hyperdense crescent within the thoracic aorta. Thoracic aorta demonstrates atherosclerotic calcifications without aneurysmal dilatation. Mild coronary calcifications are seen. No cardiac enlargement is noted. Mildly ulcerated plaque is noted within the descending thoracic aorta. No dissection is identified. The pulmonary artery as visualized shows  no large central embolus. Mediastinum/Nodes: Thoracic inlet is within normal limits. No hilar or mediastinal adenopathy is noted. The esophagus is within normal limits. Lungs/Pleura: Lungs are well aerated bilaterally with diffuse emphysematous changes. No focal infiltrate or sizable effusion is seen. No sizable parenchymal nodule is noted. Musculoskeletal: Degenerative changes of the thoracic spine are noted. Old rib fractures are noted on the left. Review of the MIP images confirms the above findings. CTA ABDOMEN AND PELVIS FINDINGS VASCULAR Aorta: Abdominal aorta demonstrates calcified and noncalcified atherosclerotic plaque throughout its course. Focal intimal flap is noted in the infrarenal aorta consistent with chronic dissection. Ulcerative plaque is noted as well. No occlusive changes are seen. No aneurysmal dilatation is seen. Celiac: Celiac axis demonstrates both calcified and noncalcified plaque at its origin with focal stenosis of approximately 50%. SMA: Patent without evidence of aneurysm, dissection, vasculitis or significant stenosis. Renals: Mild atherosclerotic changes are noted bilaterally. Early bifurcation is noted on the right. Single renal arteries are noted bilaterally. IMA: Patent without evidence of aneurysm, dissection, vasculitis or significant stenosis. Iliacs: Atherosclerotic changes are noted without focal stenosis. Veins: No venous abnormality is noted. Review of the MIP images confirms the above findings. NON-VASCULAR Hepatobiliary: Liver is well visualized within normal limits. The gallbladder is well distended. Dependent density is noted likely related to small stones or gallbladder sludge. This is stable from the prior exam. Pancreas: Pancreas is well visualized and demonstrates mild peripancreatic inflammatory change consistent with mild acute pancreatitis. No pseudocyst is seen. No changes  to suggest pancreatic necrosis are noted at this time. Spleen: Within normal limits.  Adrenals/Urinary Tract: Adrenal glands are unremarkable. Kidneys are well visualized bilaterally within normal enhancement pattern. Some scarring is noted in the left kidney in the lower pole stable from the prior exam. Previously seen left UPJ stone is no longer identified. The bladder is well distended. Stomach/Bowel: Colon is decompressed. The appendix is well visualized and within normal limits. No small bowel or gastric abnormality is seen. Lymphatic: No significant lymphadenopathy is noted. Reproductive: Uterus has been surgically removed. No adnexal mass is noted. Other: No abdominal wall hernia or abnormality. No abdominopelvic ascites. Musculoskeletal: Degenerative changes of lumbar spine are seen. Chronic superior endplate L5 compression deformity is noted. Review of the MIP images confirms the above findings. IMPRESSION: CTA of the chest: No evidence of dissection is identified. No aneurysmal dilatation is seen. Atherosclerotic plaque with mild ulcerations are noted particularly in the descending thoracic aorta. Emphysematous changes without acute parenchymal abnormality. CTA of the abdomen and pelvis: Changes consistent with mild acute pancreatitis. No pseudocyst is identified. Significant calcified and noncalcified atherosclerotic plaque within the abdominal aorta. No aneurysmal dilatation is seen. 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 Dependent density within the gallbladder consistent with small stones or gallbladder sludge. Previously seen left UPJ stone is no longer identified. Electronically Signed   By: Inez Catalina M.D.   On: 09/08/2018 21:20   US Abdomen Limited Ruq  Result Date: 09/08/2018 CLINICAL DATA:  Right upper quadrant pain EXAM: ULTRASOUND ABDOMEN LIMITED RIGHT UPPER QUADRANT COMPARISON:  CT 08/19/2017 FINDINGS: Gallbladder: Shadowing stones in the gallbladder measuring up to 4 mm. Negative sonographic Murphy. Normal wall thickness Common bile duct: Diameter: 2.5 mm Liver: No  focal lesion identified. Within normal limits in parenchymal echogenicity. Portal vein is patent on color Doppler imaging with normal direction of blood flow towards the liver. Small amount of fluid adjacent to the gallbladder. IMPRESSION: Layering gallstones with possible trace fluid adjacent to the gallbladder. Negative for sonographic Murphy or wall thickening. If acute cholecystitis remains a concern, correlation with nuclear medicine hepatobiliary imaging would be helpful for further assessment. Electronically Signed   By: Donavan Foil M.D.   On: 09/08/2018 23:31    Pending Labs Unresulted Labs (From admission, onward)    Start     Ordered   09/16/18 0500  Creatinine, serum  (enoxaparin (LOVENOX)    CrCl >/= 30 ml/min)  Weekly,   STAT    Comments:  while on enoxaparin therapy    09/09/18 0637   09/09/18 0638  HIV antibody (Routine Testing)  Once,   STAT     09/09/18 0637          Vitals/Pain Today's Vitals   09/09/18 1404 09/09/18 1500 09/09/18 1537 09/09/18 1541  BP:  (!) 162/66 (!) 162/66   Pulse:  79 79   Resp:   20   Temp:   98.2 F (36.8 C)   TempSrc:      SpO2:  92% 99%   Weight:      PainSc: 7   5  5      Isolation Precautions No active isolations  Medications Medications  nitroGLYCERIN (NITROSTAT) SL tablet 0.4 mg (0.4 mg Sublingual Given 09/08/18 2043)  HYDROmorphone (DILAUDID) injection 0.5 mg (0.5 mg Intravenous Given 09/09/18 1031)  oxyCODONE (Oxy IR/ROXICODONE) immediate release tablet 5 mg (5 mg Oral Given 09/09/18 1403)  tiotropium (SPIRIVA) inhalation capsule ([facility] use ONLY) 18 mcg (18 mcg Inhalation Given 09/09/18 1159)  acetaminophen (TYLENOL) tablet 650 mg (has no administration in time range)    Or  acetaminophen (TYLENOL) suppository 650 mg (has no administration in time range)  ondansetron (ZOFRAN) tablet 4 mg (4 mg Oral Given 09/09/18 0750)    Or  ondansetron (ZOFRAN) injection 4 mg ( Intravenous See Alternative 09/09/18 0750)  enoxaparin (LOVENOX)  injection 40 mg (40 mg Subcutaneous Given 09/09/18 1104)  insulin aspart (novoLOG) injection 0-9 Units (2 Units Subcutaneous Given 09/09/18 1147)  carvedilol (COREG) tablet 25 mg (has no administration in time range)  furosemide (LASIX) tablet 40 mg (has no administration in time range)  isosorbide mononitrate (IMDUR) 24 hr tablet 60 mg (has no administration in time range)  0.9 %  sodium chloride infusion ( Intravenous New Bag/Given 09/09/18 1551)  fentaNYL (SUBLIMAZE) injection 100 mcg (100 mcg Intravenous Given 09/08/18 2044)  ondansetron (ZOFRAN) injection 4 mg (4 mg Intravenous Given 09/08/18 2113)  iohexol (OMNIPAQUE) 350 MG/ML injection 100 mL (100 mLs Intravenous Contrast Given 09/08/18 2048)  HYDROmorphone (DILAUDID) injection 1 mg (1 mg Intravenous Given 09/08/18 2229)  ondansetron (ZOFRAN) injection 4 mg (4 mg Intravenous Given 09/08/18 2229)  0.9 %  sodium chloride infusion ( Intravenous Stopped 09/09/18 0351)  fentaNYL (SUBLIMAZE) injection 100 mcg (100 mcg Intravenous Given 09/09/18 0441)  carvedilol (COREG) tablet 25 mg (25 mg Oral Given 09/09/18 1542)  isosorbide mononitrate (IMDUR) 24 hr tablet 60 mg (60 mg Oral Given 09/09/18 1541)    Mobility walks with person assist

## 2018-09-09 NOTE — H&P (Signed)
[facility] [place] NAME: Sarah White    MR#:  [id number]  DATE OF BIRTH:  06-08-1946  DATE OF ADMISSION:  09/08/2018  PRIMARY CARE PHYSICIAN: Valerie Roys, DO   REQUESTING/REFERRING PHYSICIAN: Kerman Passey, MD  CHIEF COMPLAINT:   Chief Complaint  Patient presents with  . Chest Pain    HISTORY OF PRESENT ILLNESS:  Sarah White  is a 73 y.o. female who presents with chief complaint as above.  Patient presents tonight with acute onset of abdominal pain with nausea and vomiting.  Her pain is right quadrant to epigastric.  She states that it was severe and she was nauseated throughout the day.  As her pain got worse she came to ED for evaluation.  Here she is found to have a lipase of close to 3000.  She has likely gallbladder sludge or small stones on her CT scan.  Hospitalist called for admission  PAST MEDICAL HISTORY:   Past Medical History:  Diagnosis Date  . Acute respiratory failure with hypoxia and hypercarbia ([place]) 07/22/2015  . Cancer ([place])    uterine  . CHF (congestive heart failure) ([place])   . COPD (chronic obstructive pulmonary disease) ([place])   . Diabetes mellitus without complication ([place])   . Encephalopathy acute 07/22/2015  . Hypertension   . Pneumonia    November 2016  . Pulmonary edema 07/22/2015  . Squamous cell cancer of skin of upper arm, right      PAST SURGICAL HISTORY:   Past Surgical History:  Procedure Laterality Date  . ABDOMINAL HYSTERECTOMY    . CARPAL TUNNEL RELEASE Bilateral   . CESAREAN SECTION    . CORONARY ANGIOPLASTY WITH STENT PLACEMENT    . KNEE SURGERY Left   . LEFT HEART CATH AND CORONARY ANGIOGRAPHY Right 11/26/2016   Procedure: Left Heart Cath and Coronary Angiography;  Surgeon: Isaias Cowman, MD;  Location: [facility];  Service: Cardiovascular;  Laterality: Right;     SOCIAL HISTORY:   Social History   Tobacco Use  . Smoking status: Current Every Day  Smoker    Packs/day: 0.50    Types: Cigarettes    Last attempt to quit: 07/17/2015    Years since quitting: 3.1  . Smokeless tobacco: Never Used  Substance Use Topics  . Alcohol use: No     FAMILY HISTORY:   Family History  Problem Relation Age of Onset  . Heart disease Mother      DRUG ALLERGIES:   Allergies  Allergen Reactions  . Amlodipine Swelling  . Codeine Nausea And Vomiting  . Hctz [Hydrochlorothiazide] Other (See Comments)    Hypokalemia  . Lisinopril Other (See Comments)    Dizziness   . Metformin And Related Nausea Only    Nausea, dizziness    MEDICATIONS AT HOME:   Prior to Admission medications   Medication Sig Start Date End Date Taking? Authorizing Provider  aspirin EC 81 MG tablet Take 81 mg by mouth at bedtime.    Yes [provider]  Dulaglutide (TRULICITY) 1.5 DZ/3.2DJ SOPN Inject 1.5 mg into the skin once a week. 05/05/18  Yes Johnson, [name], DO  atorvastatin (LIPITOR) 40 MG tablet Take 1 tablet (40 mg total) by mouth daily. 03/24/18   Johnson, [name], DO  carvedilol (COREG) 12.5 MG tablet TAKE TWO TABLETS BY MOUTH TWICE DAILY WITH A MEAL 03/24/18   Johnson, [name], DO  diphenhydrAMINE (BENADRYL) 25 MG tablet Take 25 mg  by mouth every 6 (six) hours as needed for allergies.    [provider]  docusate sodium (COLACE) 100 MG capsule Take 100 mg by mouth daily.     [provider]  furosemide (LASIX) 40 MG tablet Take 1 tablet (40 mg total) by mouth daily. 03/24/18   Johnson, [name], DO  ibuprofen (ADVIL,MOTRIN) 800 MG tablet Take 1 tablet (800 mg total) by mouth every 8 (eight) hours as needed. 08/19/17   Lisa Roca, MD  isosorbide mononitrate (IMDUR) 60 MG 24 hr tablet Take 1 tablet (60 mg total) by mouth daily. 03/24/18   Johnson, [name], DO  meclizine (ANTIVERT) 12.5 MG tablet Take 1 tablet (12.5 mg total) by mouth 3 (three) times daily as needed for dizziness. 03/17/16   Volney American, PA-C  nitroGLYCERIN  (NITROSTAT) 0.4 MG SL tablet Place 1 tablet (0.4 mg total) under the tongue every 5 (five) minutes as needed for chest pain. 03/24/18   Johnson, [name], DO  ondansetron (ZOFRAN) 4 MG tablet Take 1 tablet (4 mg total) by mouth every 8 (eight) hours as needed for nausea or vomiting. 08/19/17   Lisa Roca, MD  ondansetron (ZOFRAN-ODT) 4 MG disintegrating tablet Take 4 mg by mouth every 8 (eight) hours as needed for nausea or vomiting.    [provider]  PROAIR HFA 108 (90 Base) MCG/ACT inhaler INHALE 1 TO 2 PUFFS BY MOUTH EVERY 4 HOURS AS NEEDED FOR WHEEZING OR SHORTNESS OF BREATH 06/29/18   Park Liter P, DO  sertraline (ZOLOFT) 100 MG tablet Take one pill daily 03/24/18   Wynetta Emery, [name], DO  tiotropium (SPIRIVA HANDIHALER) 18 MCG inhalation capsule Place 1 capsule (18 mcg total) into inhaler and inhale daily. 03/24/18   Johnson, [name], DO  traZODone (DESYREL) 50 MG tablet TAKE ONE-HALF TO ONE TABLET BY MOUTH AT BEDTIME AS NEEDED FOR SLEEP 09/06/18   Park Liter P, DO    REVIEW OF SYSTEMS:  Review of Systems  Constitutional: Negative for chills, fever, malaise/fatigue and weight loss.  HENT: Negative for ear pain, hearing loss and tinnitus.   Eyes: Negative for blurred vision, double vision, pain and redness.  Respiratory: Negative for cough, hemoptysis and shortness of breath.   Cardiovascular: Negative for chest pain, palpitations, orthopnea and leg swelling.  Gastrointestinal: Positive for abdominal pain, nausea and vomiting. Negative for constipation and diarrhea.  Genitourinary: Negative for dysuria, frequency and hematuria.  Musculoskeletal: Negative for back pain, joint pain and neck pain.  Skin:       No acne, rash, or lesions  Neurological: Negative for dizziness, tremors, focal weakness and weakness.  Endo/Heme/Allergies: Negative for polydipsia. Does not bruise/bleed easily.  Psychiatric/Behavioral: Negative for depression. The patient is not nervous/anxious and  does not have insomnia.      VITAL SIGNS:   Vitals:   09/08/18 2107 09/08/18 2130 09/08/18 2237 09/08/18 2345  BP: (!) 163/61 (!) 146/110 (!) 134/97   Pulse: 78 63 72 71  Resp: 15 20 (!) 22 (!) 24  Temp:      TempSrc:      SpO2: 93% 91% 91% 90%  Weight:       Wt Readings from Last 3 Encounters:  09/08/18 93.4 kg  05/05/18 93.4 kg  03/24/18 93.1 kg    PHYSICAL EXAMINATION:  Physical Exam  Vitals reviewed. Constitutional: She is oriented to person, place, and time. She appears well-developed and well-nourished. No distress.  HENT:  Head: Normocephalic and atraumatic.  Mouth/Throat: Oropharynx  is clear and moist.  Eyes: Pupils are equal, round, and reactive to light. Conjunctivae and EOM are normal. No scleral icterus.  Neck: Normal range of motion. Neck supple. No JVD present. No thyromegaly present.  Cardiovascular: Normal rate, regular rhythm and intact distal pulses. Exam reveals no gallop and no friction rub.  No murmur heard. Respiratory: Effort normal and breath sounds normal. No respiratory distress. She has no wheezes. She has no rales.  GI: Soft. Bowel sounds are normal. She exhibits no distension. There is abdominal tenderness.  Musculoskeletal: Normal range of motion.        General: No edema.     Comments: No arthritis, no gout  Lymphadenopathy:    She has no cervical adenopathy.  Neurological: She is alert and oriented to person, place, and time. No cranial nerve deficit.  No dysarthria, no aphasia  Skin: Skin is warm and dry. No rash noted. No erythema.  Psychiatric: She has a normal mood and affect. Her behavior is normal. Judgment and thought content normal.    LABORATORY PANEL:   CBC Recent Labs  Lab 09/08/18 2029  WBC 10.2  HGB 15.3*  HCT 48.4*  PLT 152   ------------------------------------------------------------------------------------------------------------------  Chemistries  Recent Labs  Lab 09/08/18 2029  NA 134*  K 4.1  CL 102   CO2 24  GLUCOSE 341*  BUN 11  CREATININE 0.70  CALCIUM 8.8*  AST 86*  ALT 48*  ALKPHOS 170*  BILITOT 0.9   ------------------------------------------------------------------------------------------------------------------  Cardiac Enzymes Recent Labs  Lab 09/08/18 2029  TROPONINI <0.03   ------------------------------------------------------------------------------------------------------------------  RADIOLOGY:  Dg Chest Portable 1 View  Result Date: 09/08/2018 CLINICAL DATA:  73 year old female with history of central chest pain which is sharp, starting 3 hours ago. No associated shortness of breath or dizziness. Vomiting and nausea for the past 3 hours. EXAM: PORTABLE CHEST 1 VIEW COMPARISON:  Chest x-ray 11/26/2016. FINDINGS: Lung volumes are normal. No consolidative airspace disease. No pleural effusions. No pneumothorax. No pulmonary nodule or mass noted. Pulmonary vasculature and the cardiomediastinal silhouette are within normal limits. Aortic atherosclerosis. IMPRESSION: 1.  No radiographic evidence of acute cardiopulmonary disease. 2. Aortic atherosclerosis. Electronically Signed   By: Vinnie Langton M.D.   On: 09/08/2018 20:24   Ct Angio Chest/abd/pel For Dissection W And/or Wo Contrast  Result Date: 09/08/2018 CLINICAL DATA:  Central chest pain for several hours EXAM: CT ANGIOGRAPHY CHEST, ABDOMEN AND PELVIS TECHNIQUE: Multidetector CT imaging through the chest, abdomen and pelvis was performed using the standard protocol during bolus administration of intravenous contrast. Multiplanar reconstructed images and MIPs were obtained and reviewed to evaluate the vascular anatomy. CONTRAST:  156mL OMNIPAQUE IOHEXOL 350 MG/ML SOLN COMPARISON:  08/19/2017 FINDINGS: CTA CHEST FINDINGS Cardiovascular: Precontrast images demonstrate no hyperdense crescent within the thoracic aorta. Thoracic aorta demonstrates atherosclerotic calcifications without aneurysmal dilatation. Mild coronary  calcifications are seen. No cardiac enlargement is noted. Mildly ulcerated plaque is noted within the descending thoracic aorta. No dissection is identified. The pulmonary artery as visualized shows no large central embolus. Mediastinum/Nodes: Thoracic inlet is within normal limits. No hilar or mediastinal adenopathy is noted. The esophagus is within normal limits. Lungs/Pleura: Lungs are well aerated bilaterally with diffuse emphysematous changes. No focal infiltrate or sizable effusion is seen. No sizable parenchymal nodule is noted. Musculoskeletal: Degenerative changes of the thoracic spine are noted. Old rib fractures are noted on the left. Review of the MIP images confirms the above findings. CTA ABDOMEN AND PELVIS FINDINGS VASCULAR Aorta: Abdominal aorta demonstrates  calcified and noncalcified atherosclerotic plaque throughout its course. Focal intimal flap is noted in the infrarenal aorta consistent with chronic dissection. Ulcerative plaque is noted as well. No occlusive changes are seen. No aneurysmal dilatation is seen. Celiac: Celiac axis demonstrates both calcified and noncalcified plaque at its origin with focal stenosis of approximately 50%. SMA: Patent without evidence of aneurysm, dissection, vasculitis or significant stenosis. Renals: Mild atherosclerotic changes are noted bilaterally. Early bifurcation is noted on the right. Single renal arteries are noted bilaterally. IMA: Patent without evidence of aneurysm, dissection, vasculitis or significant stenosis. Iliacs: Atherosclerotic changes are noted without focal stenosis. Veins: No venous abnormality is noted. Review of the MIP images confirms the above findings. NON-VASCULAR Hepatobiliary: Liver is well visualized within normal limits. The gallbladder is well distended. Dependent density is noted likely related to small stones or gallbladder sludge. This is stable from the prior exam. Pancreas: Pancreas is well visualized and demonstrates mild  peripancreatic inflammatory change consistent with mild acute pancreatitis. No pseudocyst is seen. No changes to suggest pancreatic necrosis are noted at this time. Spleen: Within normal limits. Adrenals/Urinary Tract: Adrenal glands are unremarkable. Kidneys are well visualized bilaterally within normal enhancement pattern. Some scarring is noted in the left kidney in the lower pole stable from the prior exam. Previously seen left UPJ stone is no longer identified. The bladder is well distended. Stomach/Bowel: Colon is decompressed. The appendix is well visualized and within normal limits. No small bowel or gastric abnormality is seen. Lymphatic: No significant lymphadenopathy is noted. Reproductive: Uterus has been surgically removed. No adnexal mass is noted. Other: No abdominal wall hernia or abnormality. No abdominopelvic ascites. Musculoskeletal: Degenerative changes of lumbar spine are seen. Chronic superior endplate L5 compression deformity is noted. Review of the MIP images confirms the above findings. IMPRESSION: CTA of the chest: No evidence of dissection is identified. No aneurysmal dilatation is seen. Atherosclerotic plaque with mild ulcerations are noted particularly in the descending thoracic aorta. Emphysematous changes without acute parenchymal abnormality. CTA of the abdomen and pelvis: Changes consistent with mild acute pancreatitis. No pseudocyst is identified. Significant calcified and noncalcified atherosclerotic plaque within the abdominal aorta. No aneurysmal dilatation is seen. 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 Dependent density within the gallbladder consistent with small stones or gallbladder sludge. Previously seen left UPJ stone is no longer identified. Electronically Signed   By: Inez Catalina M.D.   On: 09/08/2018 21:20   US Abdomen Limited Ruq  Result Date: 09/08/2018 CLINICAL DATA:  Right upper quadrant pain EXAM: ULTRASOUND ABDOMEN LIMITED RIGHT UPPER QUADRANT COMPARISON:   CT 08/19/2017 FINDINGS: Gallbladder: Shadowing stones in the gallbladder measuring up to 4 mm. Negative sonographic Murphy. Normal wall thickness Common bile duct: Diameter: 2.5 mm Liver: No focal lesion identified. Within normal limits in parenchymal echogenicity. Portal vein is patent on color Doppler imaging with normal direction of blood flow towards the liver. Small amount of fluid adjacent to the gallbladder. IMPRESSION: Layering gallstones with possible trace fluid adjacent to the gallbladder. Negative for sonographic Murphy or wall thickening. If acute cholecystitis remains a concern, correlation with nuclear medicine hepatobiliary imaging would be helpful for further assessment. Electronically Signed   By: Donavan Foil M.D.   On: 09/08/2018 23:31    EKG:   Orders placed or performed during the hospital encounter of 09/08/18  . EKG 12-Lead  . EKG 12-Lead  . ED EKG within 10 minutes  . ED EKG within 10 minutes    IMPRESSION AND PLAN:  Principal Problem:   Acute pancreatitis -patient significantly elevated.  Strong suspicion that she has either passed a gallstone or is passing sludge.  We will keep n.p.o., hydrate with aggressive IV fluids, PRN analgesia, trend lipase, surgery consult Active Problems:   COPD (chronic obstructive pulmonary disease) ([place]) -continue home meds   Diabetes mellitus with chronic kidney disease ([facility]) -sliding scale insulin   CAD (coronary artery disease) -continue home meds   Hyperlipidemia -continue home meds  Chart review performed and case discussed with ED provider. Labs, imaging and/or ECG reviewed by provider and discussed with patient/family. Management plans discussed with the patient and/or family.  DVT PROPHYLAXIS: SubQ lovenox   GI PROPHYLAXIS:  None  ADMISSION STATUS: Inpatient     CODE STATUS: Full Code Status History    Date Active Date Inactive Code Status Order ID Comments User Context   11/26/2016 0402 11/27/2016 1810 DNR [id number]   Harvie Bridge, DO Inpatient   03/11/2016 1856 03/12/2016 1303 DNR [id number]  Demetrios Loll, MD Inpatient   07/22/2015 1242 07/25/2015 2149 Partial Code [id number]  Wilhelmina Mcardle, MD Inpatient   07/17/2015 2119 07/22/2015 1242 DNR [id number]  Nicholes Mango, MD Inpatient    Questions for Most Recent Historical Code Status (Order [id number])    Question Answer Comment   In the event of cardiac or respiratory ARREST Do not call a "code blue"    In the event of cardiac or respiratory ARREST Do not perform Intubation, CPR, defibrillation or ACLS    In the event of cardiac or respiratory ARREST Use medication by any route, position, wound care, and other measures to relive pain and suffering. May use oxygen, suction and manual treatment of airway obstruction as needed for comfort.    Comments Confirmed with patient that she does NOT want CPR or intubation.       TOTAL TIME TAKING CARE OF THIS PATIENT: 45 minutes.   Faiz Weber Bruce 09/09/2018, 1:50 AM  [facility]  Office  [phone]  CC: Primary care physician; Valerie Roys, DO  Note:  This document was prepared using Dragon voice recognition software and may include unintentional dictation errors.

## 2018-09-09 NOTE — ED Notes (Signed)
First attempt to call report to 1A. Receiving nurse tied up in a procedure and will call back in 5 minutes.

## 2018-09-09 NOTE — Progress Notes (Signed)
Patient was seen and examined in the ED, pain is manageable.  H&P, labs reviewed.  Surgery is not considering cholecystectomy at this time.  Continue conservative management IV with IV fluids and pain management  Plan of care discussed with the patient, son and they are at bedside

## 2018-09-09 NOTE — ED Notes (Signed)
Pt given blanket repositioned and BP cuff re applied at this time.

## 2018-09-09 NOTE — Consult Note (Signed)
SURGICAL CONSULTATION NOTE   HISTORY OF PRESENT ILLNESS (HPI):  73 y.o. female presented to [facility] ED for evaluation of abdominal pain. Patient reports pain started yesterday. She was trying to tolerate the pain but it progressed. Patient refers pain localized on the epigastric area and radiates to the back. Pain is very uncomfortable and difficult to tolerate. Refers associated nausea. No alleviator or aggravator factor at this moment. Denies fever or chills. This is the fist episodes like this as per patient.   At ED patient was found with a lipase of 2996. CT scan of abdomen and pelvis shows peri pancreatic inflammation without sign of nectosis. Abdominal ultrasound showing gallstones. I personally reviewed both images.   Surgery is consulted by Dr. Jannifer Franklin in this context for evaluation and management of gallstone pancreatitis.  PAST MEDICAL HISTORY (PMH):  Past Medical History:  Diagnosis Date  . Acute respiratory failure with hypoxia and hypercarbia ([place]) 07/22/2015  . Cancer ([place])    uterine  . CHF (congestive heart failure) ([place])   . COPD (chronic obstructive pulmonary disease) ([place])   . Diabetes mellitus without complication ([place])   . Encephalopathy acute 07/22/2015  . Hypertension   . Pneumonia    November 2016  . Pulmonary edema 07/22/2015  . Squamous cell cancer of skin of upper arm, right      PAST SURGICAL HISTORY ([place]):  Past Surgical History:  Procedure Laterality Date  . ABDOMINAL HYSTERECTOMY    . CARPAL TUNNEL RELEASE Bilateral   . CESAREAN SECTION    . CORONARY ANGIOPLASTY WITH STENT PLACEMENT    . KNEE SURGERY Left   . LEFT HEART CATH AND CORONARY ANGIOGRAPHY Right 11/26/2016   Procedure: Left Heart Cath and Coronary Angiography;  Surgeon: Isaias Cowman, MD;  Location: [facility];  Service: Cardiovascular;  Laterality: Right;     MEDICATIONS:  Prior to Admission medications   Medication Sig Start Date End Date Taking? Authorizing Provider   aspirin EC 81 MG tablet Take 81 mg by mouth at bedtime.    Yes [provider]  Dulaglutide (TRULICITY) 1.5 HB/7.1IR SOPN Inject 1.5 mg into the skin once a week. 05/05/18  Yes Johnson, [name], DO  atorvastatin (LIPITOR) 40 MG tablet Take 1 tablet (40 mg total) by mouth daily. 03/24/18   Johnson, [name], DO  carvedilol (COREG) 12.5 MG tablet TAKE TWO TABLETS BY MOUTH TWICE DAILY WITH A MEAL 03/24/18   Johnson, [name], DO  diphenhydrAMINE (BENADRYL) 25 MG tablet Take 25 mg by mouth every 6 (six) hours as needed for allergies.    [provider]  docusate sodium (COLACE) 100 MG capsule Take 100 mg by mouth daily.     [provider]  furosemide (LASIX) 40 MG tablet Take 1 tablet (40 mg total) by mouth daily. 03/24/18   Johnson, [name], DO  ibuprofen (ADVIL,MOTRIN) 800 MG tablet Take 1 tablet (800 mg total) by mouth every 8 (eight) hours as needed. 08/19/17   Lisa Roca, MD  isosorbide mononitrate (IMDUR) 60 MG 24 hr tablet Take 1 tablet (60 mg total) by mouth daily. 03/24/18   Johnson, [name], DO  meclizine (ANTIVERT) 12.5 MG tablet Take 1 tablet (12.5 mg total) by mouth 3 (three) times daily as needed for dizziness. 03/17/16   Volney American, PA-C  nitroGLYCERIN (NITROSTAT) 0.4 MG SL tablet Place 1 tablet (0.4 mg total) under the tongue every 5 (five) minutes as needed for chest pain. 03/24/18   Valerie Roys,  DO  ondansetron (ZOFRAN) 4 MG tablet Take 1 tablet (4 mg total) by mouth every 8 (eight) hours as needed for nausea or vomiting. 08/19/17   Lisa Roca, MD  ondansetron (ZOFRAN-ODT) 4 MG disintegrating tablet Take 4 mg by mouth every 8 (eight) hours as needed for nausea or vomiting.    [provider]  PROAIR HFA 108 (90 Base) MCG/ACT inhaler INHALE 1 TO 2 PUFFS BY MOUTH EVERY 4 HOURS AS NEEDED FOR WHEEZING OR SHORTNESS OF BREATH 06/29/18   Park Liter P, DO  sertraline (ZOLOFT) 100 MG tablet Take one pill daily 03/24/18   Wynetta Emery, [name], DO   tiotropium (SPIRIVA HANDIHALER) 18 MCG inhalation capsule Place 1 capsule (18 mcg total) into inhaler and inhale daily. 03/24/18   Johnson, [name], DO  traZODone (DESYREL) 50 MG tablet TAKE ONE-HALF TO ONE TABLET BY MOUTH AT BEDTIME AS NEEDED FOR SLEEP 09/06/18   [place] Liter P, DO     ALLERGIES:  Allergies  Allergen Reactions  . Amlodipine Swelling  . Codeine Nausea And Vomiting  . Hctz [Hydrochlorothiazide] Other (See Comments)    Hypokalemia  . Lisinopril Other (See Comments)    Dizziness   . Metformin And Related Nausea Only    Nausea, dizziness     SOCIAL HISTORY:  Social History   Socioeconomic History  . Marital status: Divorced    Spouse name: Not on file  . Number of children: Not on file  . Years of education: Not on file  . Highest education level: Not on file  Occupational History  . Not on file  Social Needs  . Financial resource strain: Somewhat hard  . Food insecurity:    Worry: Never true    Inability: Never true  . Transportation needs:    Medical: No    Non-medical: No  Tobacco Use  . Smoking status: Current Every Day Smoker    Packs/day: 0.50    Types: Cigarettes    Last attempt to quit: 07/17/2015    Years since quitting: 3.1  . Smokeless tobacco: Never Used  Substance and Sexual Activity  . Alcohol use: No  . Drug use: No  . Sexual activity: Never  Lifestyle  . Physical activity:    Days per week: 0 days    Minutes per session: 0 min  . Stress: Not at all  Relationships  . Social connections:    Talks on phone: More than three times a week    Gets together: Once a week    Attends religious service: Never    Active member of club or organization: No    Attends meetings of clubs or organizations: Never    Relationship status: Widowed  . Intimate partner violence:    Fear of current or ex partner: No    Emotionally abused: No    Physically abused: No    Forced sexual activity: No  Other Topics Concern  . Not on file  Social  History Narrative  . Not on file     FAMILY HISTORY:  Family History  Problem Relation Age of Onset  . Heart disease Mother      REVIEW OF SYSTEMS:  Constitutional: denies weight loss, fever, chills, or sweats  Eyes: denies any other vision changes, history of eye injury  ENT: denies sore throat, hearing problems  Respiratory: denies shortness of breath, wheezing  Cardiovascular: denies chest pain, palpitations  Gastrointestinal: positive abdominal pain, Nausea, negative diarrhea Genitourinary: denies burning with urination or urinary frequency  Musculoskeletal: denies any other joint pains or cramps  Skin: denies any other rashes or skin discolorations  Neurological: denies any other headache, dizziness, weakness  Psychiatric: denies any other depression, anxiety   All other review of systems were negative   VITAL SIGNS:  Temp:  [98.4 F (36.9 C)] 98.4 F (36.9 C) (01/09 2010) Pulse Rate:  [63-82] 70 (01/10 0630) Resp:  [13-24] 14 (01/10 0630) BP: (134-178)/(56-121) 178/71 (01/10 0600) SpO2:  [84 %-97 %] 95 % (01/10 0630) Weight:  [93.4 kg] 93.4 kg (01/09 2008)       Weight: 93.4 kg     INTAKE/OUTPUT:  This shift: No intake/output data recorded.  Last 2 shifts: @IOLAST2SHIFTS @   PHYSICAL EXAM:  Constitutional:  -- Normal body habitus, obese -- Awake, alert, and oriented x3, very uncomfortable due to pain.  Eyes:  -- Pupils equally round and reactive to light  -- No scleral icterus  Ear, nose, and throat:  -- No jugular venous distension  Pulmonary:  -- No crackles  -- Equal breath sounds bilaterally -- Breathing non-labored at rest Cardiovascular:  -- S1, S2 present  -- No pericardial rubs Gastrointestinal:  -- Abdomen soft, tender on epigastric area, non-distended, no guarding or rebound tenderness -- No abdominal masses appreciated, pulsatile or otherwise  Musculoskeletal and Integumentary:  -- Wounds or skin discoloration: None appreciated --  Extremities: B/L UE and LE FROM, hands and feet warm, no edema  Neurologic:  -- Motor function: intact and symmetric -- Sensation: intact and symmetric   Labs:  CBC Latest Ref Rng & Units 09/08/2018 03/24/2018 08/19/2017  WBC 4.0 - 10.5 K/uL 10.2 7.6 11.2(H)  Hemoglobin 12.0 - 15.0 g/dL 15.3(H) 15.5 15.3  Hematocrit 36.0 - 46.0 % 48.4(H) 48.8(H) 47.9(H)  Platelets 150 - 400 K/uL 152 147(L) 151   CMP Latest Ref Rng & Units 09/08/2018 05/05/2018 03/24/2018  Glucose 70 - 99 mg/dL 341(H) 173(H) 353(H)  BUN 8 - 23 mg/dL 11 9 8   Creatinine 0.44 - 1.00 mg/dL 0.70 0.75 0.72  Sodium 135 - 145 mmol/L 134(L) 140 136  Potassium 3.5 - 5.1 mmol/L 4.1 4.2 4.6  Chloride 98 - 111 mmol/L 102 99 98  CO2 22 - 32 mmol/L 24 23 21   Calcium 8.9 - 10.3 mg/dL 8.8(L) 8.7 8.9  Total Protein 6.5 - 8.1 g/dL 6.8 6.1 6.0  Total Bilirubin 0.3 - 1.2 mg/dL 0.9 0.2 0.3  Alkaline Phos 38 - 126 U/L 170(H) 143(H) 179(H)  AST 15 - 41 U/L 86(H) 14 12  ALT 0 - 44 U/L 48(H) 17 12   Lipase     Component Value Date/Time   LIPASE 2,993 (H) 09/08/2018 2029    Imaging studies:  EXAM: ULTRASOUND ABDOMEN LIMITED RIGHT UPPER QUADRANT  COMPARISON:  CT 08/19/2017  FINDINGS: Gallbladder: Shadowing stones in the gallbladder measuring up to 4 mm. Negative sonographic Murphy. Normal wall thickness  Common bile duct: Diameter: 2.5 mm  Liver: No focal lesion identified. Within normal limits in parenchymal echogenicity. Portal vein is patent on color Doppler imaging with normal direction of blood flow towards the liver. Small amount of fluid adjacent to the gallbladder.  IMPRESSION: Layering gallstones with possible trace fluid adjacent to the gallbladder. Negative for sonographic Murphy or wall thickening. If acute cholecystitis remains a concern, correlation with nuclear medicine hepatobiliary imaging would be helpful for further assessment.  Electronically Signed   By: Donavan Foil M.D.   On: 09/08/2018  23:31  Assessment/Plan:  73 y.o. female with gallstone pancreatitis,  complicated by pertinent comorbidities including COPD, coronary artery disease, congestive heart failure, diabetes mellitus.  Patient with acute pancreatitis most likely due to gallstone as seen on ultrasound. At this moment patient is very uncomfortable with epigastric pain that has not improved with medical management. At this moment it is not recommended to proceed with surgical management of cholecystectomy due to the active acute pancreatitis. Will follow patient during admission, and when lipase trends down and pain resolves, will consider and discuss with patient about cholecystectomy during this admission.   Patient was oriented about the diagnosis of pancreatitis, the treatment plan and the possibility of having surgery during the admission. She refers that she understand.  Arnold Long, MD

## 2018-09-09 NOTE — ED Notes (Signed)
Patient placed on 2 liters of O2.

## 2018-09-09 NOTE — ED Notes (Signed)
Pt up to use restroom with no assistance

## 2018-09-09 NOTE — ED Notes (Signed)
Pt call light going on, this tech in with pt at this time, pt requesting to use the restroom with no assistance. Pt also wanting to know "how much longer until I can have more pain medicine" RN Sherri aware of pt's wants

## 2018-09-10 LAB — COMPREHENSIVE METABOLIC PANEL
ALT: 28 U/L (ref 0–44)
AST: 22 U/L (ref 15–41)
Albumin: 3.3 g/dL — ABNORMAL LOW (ref 3.5–5.0)
Alkaline Phosphatase: 124 U/L (ref 38–126)
Anion gap: 6 (ref 5–15)
BUN: 9 mg/dL (ref 8–23)
CHLORIDE: 105 mmol/L (ref 98–111)
CO2: 26 mmol/L (ref 22–32)
Calcium: 8.2 mg/dL — ABNORMAL LOW (ref 8.9–10.3)
Creatinine, Ser: 0.56 mg/dL (ref 0.44–1.00)
GFR calc Af Amer: >60 mL/min (ref >60)
GFR calc non Af Amer: >60 mL/min (ref >60)
Glucose, Bld: 156 mg/dL — ABNORMAL HIGH (ref 70–99)
POTASSIUM: 3.8 mmol/L (ref 3.5–5.1)
Sodium: 137 mmol/L (ref 135–145)
Total Bilirubin: 0.6 mg/dL (ref 0.3–1.2)
Total Protein: 6.1 g/dL — ABNORMAL LOW (ref 6.5–8.1)

## 2018-09-10 LAB — GLUCOSE, CAPILLARY
Glucose-Capillary: 121 mg/dL — ABNORMAL HIGH (ref 70–99)
Glucose-Capillary: 126 mg/dL — ABNORMAL HIGH (ref 70–99)
Glucose-Capillary: 136 mg/dL — ABNORMAL HIGH (ref 70–99)
Glucose-Capillary: 139 mg/dL — ABNORMAL HIGH (ref 70–99)
Glucose-Capillary: 149 mg/dL — ABNORMAL HIGH (ref 70–99)

## 2018-09-10 LAB — LIPASE, BLOOD: LIPASE: 44 U/L (ref 11–51)

## 2018-09-10 LAB — HIV ANTIBODY (ROUTINE TESTING W REFLEX): HIV Screen 4th Generation wRfx: NONREACTIVE

## 2018-09-10 MED ORDER — SODIUM CHLORIDE 0.9 % IV SOLN
INTRAVENOUS | Status: DC
  Administered 2018-09-10 – 2018-09-14 (×7): via INTRAVENOUS

## 2018-09-10 MED ORDER — TRAZODONE HCL 50 MG PO TABS
50.0000 mg | ORAL_TABLET | Freq: Every evening | ORAL | Status: DC | PRN
  Administered 2018-09-11 (×2): 50 mg via ORAL
  Filled 2018-09-10 (×2): qty 1

## 2018-09-10 NOTE — Progress Notes (Signed)
[facility] Hospital Day(s): 1.   Post op day(s):  Marland Kitchen   Interval History: Patient seen and examined, no acute events or new complaints overnight. Patient reports improvement of pian. Refers some pain on lower quadrant but much better than yesterday. Patient looks more comfortable, denies nausea.   Vital signs in last 24 hours: [min-max] current  Temp:  [98.1 F (36.7 C)-98.2 F (36.8 C)] 98.1 F (36.7 C) (01/10 2345) Pulse Rate:  [70-83] 70 (01/10 2345) Resp:  [18-20] 19 (01/10 2345) BP: (152-180)/(64-78) 162/72 (01/10 2345) SpO2:  [92 %-99 %] 96 % (01/10 2345) Weight:  [93.4 kg] 93.4 kg (01/10 1747)     Height: 5\' 5"  (165.1 cm) Weight: 93.4 kg BMI (Calculated): 34.27    Physical Exam:  Constitutional: alert, cooperative and no distress  Respiratory: breathing non-labored at rest  Cardiovascular: regular rate and sinus rhythm  Gastrointestinal: soft, non-tender, and non-distended  Labs:  CBC Latest Ref Rng & Units 09/09/2018 09/08/2018 03/24/2018  WBC 4.0 - 10.5 K/uL 10.1 10.2 7.6  Hemoglobin 12.0 - 15.0 g/dL 14.3 15.3(H) 15.5  Hematocrit 36.0 - 46.0 % 45.7 48.4(H) 48.8(H)  Platelets 150 - 400 K/uL 143(L) 152 147(L)   CMP Latest Ref Rng & Units 09/10/2018 09/09/2018 09/08/2018  Glucose 70 - 99 mg/dL 156(H) 275(H) 341(H)  BUN 8 - 23 mg/dL 9 11 11   Creatinine 0.44 - 1.00 mg/dL 0.56 0.63 0.70  Sodium 135 - 145 mmol/L 137 137 134(L)  Potassium 3.5 - 5.1 mmol/L 3.8 4.1 4.1  Chloride 98 - 111 mmol/L 105 105 102  CO2 22 - 32 mmol/L 26 26 24   Calcium 8.9 - 10.3 mg/dL 8.2(L) 8.4(L) 8.8(L)  Total Protein 6.5 - 8.1 g/dL 6.1(L) - 6.8  Total Bilirubin 0.3 - 1.2 mg/dL 0.6 - 0.9  Alkaline Phos 38 - 126 U/L 124 - 170(H)  AST 15 - 41 U/L 22 - 86(H)  ALT 0 - 44 U/L 28 - 48(H)   Lipase     Component Value Date/Time   LIPASE 523 (H) 09/09/2018 0657     Imaging studies: No new pertinent imaging studies   Assessment/Plan:  73 y.o. female with gallstone pancreatitis,  complicated by pertinent comorbidities including COPD, coronary artery disease, congestive heart failure, diabetes mellitus.  Patient today with improved pain and improved lipase level. Pain not resolved completely yet but improving. Still not ready for surgery. No contraindication to start clear liquid diet from surgery standpoint. Will continue to follow up the lipase trend and abdominal pian to resolve to perform surgery.   Will appreciate Hospitalist pre operative evaluation and optimization from her chronic medical condition.   Arnold Long, MD

## 2018-09-10 NOTE — Progress Notes (Signed)
Pt npo and iv fluids expired. Spoke with Dr. Posey Pronto may reorder iv fluids.

## 2018-09-10 NOTE — Progress Notes (Signed)
[facility] [place] NAME: Sarah White    MR#:  [id number]  DATE OF BIRTH:  Mar 01, 1946  SUBJECTIVE:  CHIEF COMPLAINT: Patient is feeling much better abdominal pain is better feeling thirsty some nausea but denies any vomiting.  REVIEW OF SYSTEMS:  CONSTITUTIONAL: No fever, fatigue or weakness.  EYES: No blurred or double vision.  EARS, NOSE, AND THROAT: No tinnitus or ear pain.  RESPIRATORY: No cough, shortness of breath, wheezing or hemoptysis.  CARDIOVASCULAR: No chest pain, orthopnea, edema.  GASTROINTESTINAL: Reports nausea but denies any vomiting, has some epigastric abdominal discomfort  GENITOURINARY: No dysuria, hematuria.  ENDOCRINE: No polyuria, nocturia,  HEMATOLOGY: No anemia, easy bruising or bleeding SKIN: No rash or lesion. MUSCULOSKELETAL: No joint pain or arthritis.   NEUROLOGIC: No tingling, numbness, weakness.  PSYCHIATRY: No anxiety or depression.   DRUG ALLERGIES:   Allergies  Allergen Reactions  . Amlodipine Swelling  . Codeine Nausea And Vomiting  . Hctz [Hydrochlorothiazide] Other (See Comments)    Hypokalemia  . Lisinopril Other (See Comments)    Dizziness   . Metformin And Related Nausea Only    Nausea, dizziness    VITALS:  Blood pressure (!) 162/72, pulse 70, temperature 98.1 F (36.7 C), resp. rate 19, height 5\' 5"  (1.651 m), weight 93.4 kg, SpO2 96 %.  PHYSICAL EXAMINATION:  GENERAL:  73 y.o.-year-old patient lying in the bed with no acute distress.  EYES: Pupils equal, round, reactive to light and accommodation. No scleral icterus. Extraocular muscles intact.  HEENT: Head atraumatic, normocephalic. Oropharynx and nasopharynx clear.  NECK:  Supple, no jugular venous distention. No thyroid enlargement, no tenderness.  LUNGS: Normal breath sounds bilaterally, no wheezing, rales,rhonchi or crepitation. No use of accessory muscles of respiration.  CARDIOVASCULAR: S1, S2 normal. No murmurs,  rubs, or gallops.  ABDOMEN: Soft, minimal epigastric tenderness no rebound tenderness, nondistended. Bowel sounds present.  EXTREMITIES: No pedal edema, cyanosis, or clubbing.  NEUROLOGIC: Cranial nerves II through XII are intact. Muscle strength 5/5 in all extremities. Sensation intact. Gait not checked.  PSYCHIATRIC: The patient is alert and oriented x 3.  SKIN: No obvious rash, lesion, or ulcer.    LABORATORY PANEL:   CBC Recent Labs  Lab 09/09/18 0657  WBC 10.1  HGB 14.3  HCT 45.7  PLT 143*   ------------------------------------------------------------------------------------------------------------------  Chemistries  Recent Labs  Lab 09/10/18 0943  NA 137  K 3.8  CL 105  CO2 26  GLUCOSE 156*  BUN 9  CREATININE 0.56  CALCIUM 8.2*  AST 22  ALT 28  ALKPHOS 124  BILITOT 0.6   ------------------------------------------------------------------------------------------------------------------  Cardiac Enzymes Recent Labs  Lab 09/08/18 2029  TROPONINI <0.03   ------------------------------------------------------------------------------------------------------------------  RADIOLOGY:  Dg Chest Portable 1 View  Result Date: 09/08/2018 CLINICAL DATA:  73 year old female with history of central chest pain which is sharp, starting 3 hours ago. No associated shortness of breath or dizziness. Vomiting and nausea for the past 3 hours. EXAM: PORTABLE CHEST 1 VIEW COMPARISON:  Chest x-ray 11/26/2016. FINDINGS: Lung volumes are normal. No consolidative airspace disease. No pleural effusions. No pneumothorax. No pulmonary nodule or mass noted. Pulmonary vasculature and the cardiomediastinal silhouette are within normal limits. Aortic atherosclerosis. IMPRESSION: 1.  No radiographic evidence of acute cardiopulmonary disease. 2. Aortic atherosclerosis. Electronically Signed   By: Vinnie Langton M.D.   On: 09/08/2018 20:24   Ct Angio Chest/abd/pel For Dissection W And/or Wo  Contrast  Result Date: 09/08/2018 CLINICAL DATA:  Central chest pain for several hours EXAM: CT ANGIOGRAPHY CHEST, ABDOMEN AND PELVIS TECHNIQUE: Multidetector CT imaging through the chest, abdomen and pelvis was performed using the standard protocol during bolus administration of intravenous contrast. Multiplanar reconstructed images and MIPs were obtained and reviewed to evaluate the vascular anatomy. CONTRAST:  19mL OMNIPAQUE IOHEXOL 350 MG/ML SOLN COMPARISON:  08/19/2017 FINDINGS: CTA CHEST FINDINGS Cardiovascular: Precontrast images demonstrate no hyperdense crescent within the thoracic aorta. Thoracic aorta demonstrates atherosclerotic calcifications without aneurysmal dilatation. Mild coronary calcifications are seen. No cardiac enlargement is noted. Mildly ulcerated plaque is noted within the descending thoracic aorta. No dissection is identified. The pulmonary artery as visualized shows no large central embolus. Mediastinum/Nodes: Thoracic inlet is within normal limits. No hilar or mediastinal adenopathy is noted. The esophagus is within normal limits. Lungs/Pleura: Lungs are well aerated bilaterally with diffuse emphysematous changes. No focal infiltrate or sizable effusion is seen. No sizable parenchymal nodule is noted. Musculoskeletal: Degenerative changes of the thoracic spine are noted. Old rib fractures are noted on the left. Review of the MIP images confirms the above findings. CTA ABDOMEN AND PELVIS FINDINGS VASCULAR Aorta: Abdominal aorta demonstrates calcified and noncalcified atherosclerotic plaque throughout its course. Focal intimal flap is noted in the infrarenal aorta consistent with chronic dissection. Ulcerative plaque is noted as well. No occlusive changes are seen. No aneurysmal dilatation is seen. Celiac: Celiac axis demonstrates both calcified and noncalcified plaque at its origin with focal stenosis of approximately 50%. SMA: Patent without evidence of aneurysm, dissection,  vasculitis or significant stenosis. Renals: Mild atherosclerotic changes are noted bilaterally. Early bifurcation is noted on the right. Single renal arteries are noted bilaterally. IMA: Patent without evidence of aneurysm, dissection, vasculitis or significant stenosis. Iliacs: Atherosclerotic changes are noted without focal stenosis. Veins: No venous abnormality is noted. Review of the MIP images confirms the above findings. NON-VASCULAR Hepatobiliary: Liver is well visualized within normal limits. The gallbladder is well distended. Dependent density is noted likely related to small stones or gallbladder sludge. This is stable from the prior exam. Pancreas: Pancreas is well visualized and demonstrates mild peripancreatic inflammatory change consistent with mild acute pancreatitis. No pseudocyst is seen. No changes to suggest pancreatic necrosis are noted at this time. Spleen: Within normal limits. Adrenals/Urinary Tract: Adrenal glands are unremarkable. Kidneys are well visualized bilaterally within normal enhancement pattern. Some scarring is noted in the left kidney in the lower pole stable from the prior exam. Previously seen left UPJ stone is no longer identified. The bladder is well distended. Stomach/Bowel: Colon is decompressed. The appendix is well visualized and within normal limits. No small bowel or gastric abnormality is seen. Lymphatic: No significant lymphadenopathy is noted. Reproductive: Uterus has been surgically removed. No adnexal mass is noted. Other: No abdominal wall hernia or abnormality. No abdominopelvic ascites. Musculoskeletal: Degenerative changes of lumbar spine are seen. Chronic superior endplate L5 compression deformity is noted. Review of the MIP images confirms the above findings. IMPRESSION: CTA of the chest: No evidence of dissection is identified. No aneurysmal dilatation is seen. Atherosclerotic plaque with mild ulcerations are noted particularly in the descending thoracic  aorta. Emphysematous changes without acute parenchymal abnormality. CTA of the abdomen and pelvis: Changes consistent with mild acute pancreatitis. No pseudocyst is identified. Significant calcified and noncalcified atherosclerotic plaque within the abdominal aorta. No aneurysmal dilatation is seen. 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 Dependent density within the gallbladder consistent with small stones or gallbladder sludge. Previously seen left UPJ stone is no longer identified.  Electronically Signed   By: Inez Catalina M.D.   On: 09/08/2018 21:20   US Abdomen Limited Ruq  Result Date: 09/08/2018 CLINICAL DATA:  Right upper quadrant pain EXAM: ULTRASOUND ABDOMEN LIMITED RIGHT UPPER QUADRANT COMPARISON:  CT 08/19/2017 FINDINGS: Gallbladder: Shadowing stones in the gallbladder measuring up to 4 mm. Negative sonographic Murphy. Normal wall thickness Common bile duct: Diameter: 2.5 mm Liver: No focal lesion identified. Within normal limits in parenchymal echogenicity. Portal vein is patent on color Doppler imaging with normal direction of blood flow towards the liver. Small amount of fluid adjacent to the gallbladder. IMPRESSION: Layering gallstones with possible trace fluid adjacent to the gallbladder. Negative for sonographic Murphy or wall thickening. If acute cholecystitis remains a concern, correlation with nuclear medicine hepatobiliary imaging would be helpful for further assessment. Electronically Signed   By: Donavan Foil M.D.   On: 09/08/2018 23:31    EKG:   Orders placed or performed during the hospital encounter of 09/08/18  . EKG 12-Lead  . EKG 12-Lead    ASSESSMENT AND PLAN:    Acute gallstone pancreatitis - Clinically doing fine, n.p.o., continue aggressive hydration with IV fluids  LFTs are trending down  Pain management as needed Serial lipase levels  Surgery Dr. Peyton Najjar is following  We will consult cardiology for medical clearance.  Last seen by Dr. Clayborn Bigness in April  2018  Chronic COPD with no exacerbation-continue home inhalers and breathing treatments as needed   -  Diabetes mellitus with chronic kidney disease ([place]) -sliding scale insulin    CAD (coronary artery disease) -patient is asymptomatic continue home meds Imdur, Coreg, nitroglycerin as needed LDL 92, not on statin Echocardiogram from March 2018 with 50 to 55% ejection fraction       All the records are reviewed and case discussed with Care Management/Social Workerr. Management plans discussed with the patient, family and they are in agreement.  CODE STATUS:   TOTAL TIME TAKING CARE OF THIS PATIENT: 36 minutes.   POSSIBLE D/C IN 2 DAYS, DEPENDING ON CLINICAL CONDITION.  Note: This dictation was prepared with Dragon dictation along with smaller phrase technology. Any transcriptional errors that result from this process are unintentional.   Nicholes Mango M.D on 09/10/2018 at 2:58 PM  Between 7am to 6pm - Pager - [phone] After 6pm go to [email or web address] - password [email or web address] [facility]  Office  [phone]  CC: Primary care physician; Valerie Roys, DO

## 2018-09-11 LAB — GLUCOSE, CAPILLARY
Glucose-Capillary: 92 mg/dL (ref 70–99)
Glucose-Capillary: 148 mg/dL — ABNORMAL HIGH (ref 70–99)
Glucose-Capillary: 148 mg/dL — ABNORMAL HIGH (ref 70–99)
Glucose-Capillary: 98 mg/dL (ref 70–99)

## 2018-09-11 LAB — LIPASE, BLOOD: LIPASE: 24 U/L (ref 11–51)

## 2018-09-11 MED ORDER — MUPIROCIN 2 % EX OINT
1.0000 "application " | TOPICAL_OINTMENT | Freq: Two times a day (BID) | CUTANEOUS | Status: DC
  Administered 2018-09-12 – 2018-09-14 (×5): 1 via NASAL
  Filled 2018-09-11: qty 22

## 2018-09-11 NOTE — Progress Notes (Signed)
[facility] [place] NAME: Sarah White    MR#:  [id number]  DATE OF BIRTH:  02-06-1946  SUBJECTIVE:  CHIEF COMPLAINT: Patient is feeling much better abdominal pain is better, tolerating clears  REVIEW OF SYSTEMS:  CONSTITUTIONAL: No fever, fatigue or weakness.  EYES: No blurred or double vision.  EARS, NOSE, AND THROAT: No tinnitus or ear pain.  RESPIRATORY: No cough, shortness of breath, wheezing or hemoptysis.  CARDIOVASCULAR: No chest pain, orthopnea, edema.  GASTROINTESTINAL: Reports nausea but denies any vomiting, has some epigastric abdominal discomfort  GENITOURINARY: No dysuria, hematuria.  ENDOCRINE: No polyuria, nocturia,  HEMATOLOGY: No anemia, easy bruising or bleeding SKIN: No rash or lesion. MUSCULOSKELETAL: No joint pain or arthritis.   NEUROLOGIC: No tingling, numbness, weakness.  PSYCHIATRY: No anxiety or depression.   DRUG ALLERGIES:   Allergies  Allergen Reactions  . Amlodipine Swelling  . Codeine Nausea And Vomiting  . Hctz [Hydrochlorothiazide] Other (See Comments)    Hypokalemia  . Lisinopril Other (See Comments)    Dizziness   . Metformin And Related Nausea Only    Nausea, dizziness    VITALS:  Blood pressure (!) 128/52, pulse 75, temperature 98.1 F (36.7 C), temperature source Oral, resp. rate 18, height 5\' 5"  (1.651 m), weight 93.4 kg, SpO2 96 %.  PHYSICAL EXAMINATION:  GENERAL:  73 y.o.-year-old patient lying in the bed with no acute distress.  EYES: Pupils equal, round, reactive to light and accommodation. No scleral icterus. Extraocular muscles intact.  HEENT: Head atraumatic, normocephalic. Oropharynx and nasopharynx clear.  NECK:  Supple, no jugular venous distention. No thyroid enlargement, no tenderness.  LUNGS: Normal breath sounds bilaterally, no wheezing, rales,rhonchi or crepitation. No use of accessory muscles of respiration.  CARDIOVASCULAR: S1, S2 normal. No murmurs, rubs, or  gallops.  ABDOMEN: Soft, minimal epigastric tenderness no rebound tenderness, nondistended. Bowel sounds present.  EXTREMITIES: No pedal edema, cyanosis, or clubbing.  NEUROLOGIC: Cranial nerves II through XII are intact. Muscle strength 5/5 in all extremities. Sensation intact. Gait not checked.  PSYCHIATRIC: The patient is alert and oriented x 3.  SKIN: No obvious rash, lesion, or ulcer.    LABORATORY PANEL:   CBC Recent Labs  Lab 09/09/18 0657  WBC 10.1  HGB 14.3  HCT 45.7  PLT 143*   ------------------------------------------------------------------------------------------------------------------  Chemistries  Recent Labs  Lab 09/10/18 0943  NA 137  K 3.8  CL 105  CO2 26  GLUCOSE 156*  BUN 9  CREATININE 0.56  CALCIUM 8.2*  AST 22  ALT 28  ALKPHOS 124  BILITOT 0.6   ------------------------------------------------------------------------------------------------------------------  Cardiac Enzymes Recent Labs  Lab 09/08/18 2029  TROPONINI <0.03   ------------------------------------------------------------------------------------------------------------------  RADIOLOGY:  No results found.  EKG:   Orders placed or performed during the hospital encounter of 09/08/18  . EKG 12-Lead  . EKG 12-Lead    ASSESSMENT AND PLAN:    Acute gallstone pancreatitis - Clinically doing fine, n.p.o.,after midnight  Tolerating clears  continue aggressive hydration with IV fluids  LFTs are trending down  Pain management as needed  lipase levels - nml range  Surgery Dr. Peyton Najjar is following - planning for sx if cardiology clears the pt Pending cardiology consult  for medical clearance.  Last seen by Dr. Clayborn Bigness in April 2018  Chronic COPD with no exacerbation-continue home inhalers and breathing treatments as needed   -  Diabetes mellitus with chronic kidney disease ([place]) -sliding scale insulin    CAD (coronary  artery disease) -patient is asymptomatic continue  home meds Imdur, Coreg, nitroglycerin as needed LDL 92, not on statin Echocardiogram from March 2018 with 50 to 55% ejection fraction. Rpt echo pending         All the records are reviewed and case discussed with Care Management/Social Workerr. Management plans discussed with the patient, family and they are in agreement.  CODE STATUS:   TOTAL TIME TAKING CARE OF THIS PATIENT: 36 minutes.   POSSIBLE D/C IN 2 DAYS, DEPENDING ON CLINICAL CONDITION.  Note: This dictation was prepared with Dragon dictation along with smaller phrase technology. Any transcriptional errors that result from this process are unintentional.   Nicholes Mango M.D on 09/11/2018 at 1:59 PM  Between 7am to 6pm - Pager - [phone] After 6pm go to [email or web address] - password [email or web address] [facility]  Office  [phone]  CC: Primary care physician; Valerie Roys, DO

## 2018-09-11 NOTE — Plan of Care (Signed)
  Problem: Education: Goal: Knowledge of General Education information will improve Description: Including pain rating scale, medication(s)/side effects and non-pharmacologic comfort measures Outcome: Progressing   Problem: Health Behavior/Discharge Planning: Goal: Ability to manage health-related needs will improve Outcome: Progressing   Problem: Clinical Measurements: Goal: Ability to maintain clinical measurements within normal limits will improve Outcome: Progressing Goal: Will remain free from infection Outcome: Progressing Goal: Diagnostic test results will improve Outcome: Progressing Goal: Respiratory complications will improve Outcome: Progressing Goal: Cardiovascular complication will be avoided Outcome: Progressing   Problem: Coping: Goal: Level of anxiety will decrease Outcome: Progressing   Problem: Pain Managment: Goal: General experience of comfort will improve Outcome: Progressing   Problem: Skin Integrity: Goal: Risk for impaired skin integrity will decrease Outcome: Progressing   

## 2018-09-11 NOTE — Progress Notes (Signed)
[facility] Hospital Day(s): 2.   Post op day(s):  Marland Kitchen   Interval History: Patient seen and examined, no acute events or new complaints overnight. Patient reports pain comes and goes and improving intervals without pain, but still significant, denies nausea. Refers tolerated clear liquids this morning.   Vital signs in last 24 hours: [min-max] current  Temp:  [97.8 F (36.6 C)-98.1 F (36.7 C)] 98.1 F (36.7 C) (01/11 2356) Pulse Rate:  [78-79] 79 (01/11 2356) Resp:  [18-20] 20 (01/11 2356) BP: (138-141)/(59-89) 141/89 (01/11 2356) SpO2:  [95 %-98 %] 95 % (01/11 2356)     Height: 5\' 5"  (165.1 cm) Weight: 93.4 kg BMI (Calculated): 34.27    Physical Exam:  Constitutional: alert, cooperative and no distress  Respiratory: breathing non-labored at rest  Cardiovascular: regular rate and sinus rhythm  Gastrointestinal: soft, mild-tender, and non-distended  Labs:  CBC Latest Ref Rng & Units 09/09/2018 09/08/2018 03/24/2018  WBC 4.0 - 10.5 K/uL 10.1 10.2 7.6  Hemoglobin 12.0 - 15.0 g/dL 14.3 15.3(H) 15.5  Hematocrit 36.0 - 46.0 % 45.7 48.4(H) 48.8(H)  Platelets 150 - 400 K/uL 143(L) 152 147(L)   CMP Latest Ref Rng & Units 09/10/2018 09/09/2018 09/08/2018  Glucose 70 - 99 mg/dL 156(H) 275(H) 341(H)  BUN 8 - 23 mg/dL 9 11 11   Creatinine 0.44 - 1.00 mg/dL 0.56 0.63 0.70  Sodium 135 - 145 mmol/L 137 137 134(L)  Potassium 3.5 - 5.1 mmol/L 3.8 4.1 4.1  Chloride 98 - 111 mmol/L 105 105 102  CO2 22 - 32 mmol/L 26 26 24   Calcium 8.9 - 10.3 mg/dL 8.2(L) 8.4(L) 8.8(L)  Total Protein 6.5 - 8.1 g/dL 6.1(L) - 6.8  Total Bilirubin 0.3 - 1.2 mg/dL 0.6 - 0.9  Alkaline Phos 38 - 126 U/L 124 - 170(H)  AST 15 - 41 U/L 22 - 86(H)  ALT 0 - 44 U/L 28 - 48(H)   Lipase     Component Value Date/Time   LIPASE 24 09/11/2018 0544     Imaging studies: No new pertinent imaging studies   Assessment/Plan:  73 y.o.femalewith gallstone pancreatitis, complicated by pertinent comorbidities  includingCOPD, coronary artery disease,congestive heart failure,diabetes mellitus.  Today continue with pain but improving. Not ready for surgery today. Will re evaluate tomorrow if pain continue to improve, may have surgery tomorrow if cleared by Cardiology.   Agree to continue clear liquid. NPO after midnight. If pain improved and cleared by cardiology will consider cholecystectomy tomorrow.   Arnold Long, MD

## 2018-09-11 NOTE — Consult Note (Signed)
Cardiology Consultation Note    [name], MRN: [id number], DOB/AGE: [birth year] 73 y.o. Admit date: 09/08/2018   Date of Consult: 09/11/2018 Primary Physician: Valerie Roys, DO Primary Cardiologist: Dr. Clayborn Bigness  Chief Complaint: epigastric pain Reason for Consultation: preop clearance Requesting MD: Dr. Margaretmary Eddy  HPI: Sarah White is a 73 y.o. female with history of coronary artery disease with a known occluded left circumflex and RCA but well collateralized who was admitted with epigastric pain and noted to have acute cholecystitis.  She will require probable cholecystectomy in the morning.  She is currently hemodynamically stable.  She has been able to be active without exertional chest pain.  EKG showed sinus rhythm with no ischemic changes.  She has ruled out for myocardial infarction.  Renal function is normal.  Chest x-ray revealed no acute cardiopulmonary disease.  She had fairly well preserved LV function EF 45 to 50% by functional study in 2016.  Echo in 2018 revealed no significant valvular disease with EF of 50 to 55%.  There was no regional wall motion abnormality.  As an outpatient she is currently treated with atorvastatin 40 mg daily, carvedilol 12.5 twice daily, aspirin, isosorbide mononitrate 60 mg daily.  Past Medical History:  Diagnosis Date  . Acute respiratory failure with hypoxia and hypercarbia ([place]) 07/22/2015  . Cancer ([place])    uterine  . CHF (congestive heart failure) ([place])   . COPD (chronic obstructive pulmonary disease) ([place])   . Diabetes mellitus without complication ([place])   . Encephalopathy acute 07/22/2015  . Hypertension   . Pneumonia    November 2016  . Pulmonary edema 07/22/2015  . Squamous cell cancer of skin of upper arm, right       Surgical History:  Past Surgical History:  Procedure Laterality Date  . ABDOMINAL HYSTERECTOMY    . CARPAL TUNNEL RELEASE Bilateral   . CESAREAN SECTION    . CORONARY ANGIOPLASTY WITH STENT  PLACEMENT    . KNEE SURGERY Left   . LEFT HEART CATH AND CORONARY ANGIOGRAPHY Right 11/26/2016   Procedure: Left Heart Cath and Coronary Angiography;  Surgeon: Isaias Cowman, MD;  Location: [facility];  Service: Cardiovascular;  Laterality: Right;     Home Meds: Prior to Admission medications   Medication Sig Start Date End Date Taking? Authorizing Provider  aspirin EC 81 MG tablet Take 81 mg by mouth at bedtime.    Yes [provider]  atorvastatin (LIPITOR) 40 MG tablet Take 1 tablet (40 mg total) by mouth daily. 03/24/18  Yes Johnson, [name], DO  carvedilol (COREG) 12.5 MG tablet TAKE TWO TABLETS BY MOUTH TWICE DAILY WITH A MEAL 03/24/18  Yes Johnson, [name], DO  Dulaglutide (TRULICITY) 1.5 JI/9.6VE SOPN Inject 1.5 mg into the skin once a week. 05/05/18  Yes Johnson, [name], DO  furosemide (LASIX) 40 MG tablet Take 1 tablet (40 mg total) by mouth daily. 03/24/18  Yes Johnson, [name], DO  isosorbide mononitrate (IMDUR) 60 MG 24 hr tablet Take 1 tablet (60 mg total) by mouth daily. 03/24/18  Yes Johnson, [name], DO  nitroGLYCERIN (NITROSTAT) 0.4 MG SL tablet Place 1 tablet (0.4 mg total) under the tongue every 5 (five) minutes as needed for chest pain. 03/24/18  Yes Johnson, [name], DO  sertraline (ZOLOFT) 100 MG tablet Take one pill daily 03/24/18  Yes Johnson, [name], DO  tiotropium (SPIRIVA HANDIHALER) 18 MCG inhalation capsule Place 1 capsule (18 mcg total)  into inhaler and inhale daily. 03/24/18  Yes Johnson, [name], DO  traZODone (DESYREL) 50 MG tablet TAKE ONE-HALF TO ONE TABLET BY MOUTH AT BEDTIME AS NEEDED FOR SLEEP 09/06/18  Yes Johnson, [name], DO  diphenhydrAMINE (BENADRYL) 25 MG tablet Take 25 mg by mouth every 6 (six) hours as needed for allergies.    [provider]  docusate sodium (COLACE) 100 MG capsule Take 100 mg by mouth daily.     [provider]  ibuprofen (ADVIL,MOTRIN) 800 MG tablet Take 1 tablet (800 mg total) by mouth every 8  (eight) hours as needed. 08/19/17   Lisa Roca, MD  meclizine (ANTIVERT) 12.5 MG tablet Take 1 tablet (12.5 mg total) by mouth 3 (three) times daily as needed for dizziness. Patient not taking: Reported on 09/09/2018 03/17/16   Volney American, PA-C  ondansetron (ZOFRAN-ODT) 4 MG disintegrating tablet Take 4 mg by mouth every 8 (eight) hours as needed for nausea or vomiting.    [provider]  PROAIR HFA 108 (90 Base) MCG/ACT inhaler INHALE 1 TO 2 PUFFS BY MOUTH EVERY 4 HOURS AS NEEDED FOR WHEEZING OR SHORTNESS OF BREATH 06/29/18   Valerie Roys, DO    Inpatient Medications:  . carvedilol  25 mg Oral BID WC  . enoxaparin (LOVENOX) injection  40 mg Subcutaneous Q24H  . furosemide  40 mg Oral BID  . insulin aspart  0-9 Units Subcutaneous Q6H  . isosorbide mononitrate  60 mg Oral Daily  . tiotropium  18 mcg Inhalation Daily   . sodium chloride 50 mL/hr at 09/11/18 0801    Allergies:  Allergies  Allergen Reactions  . Amlodipine Swelling  . Codeine Nausea And Vomiting  . Hctz [Hydrochlorothiazide] Other (See Comments)    Hypokalemia  . Lisinopril Other (See Comments)    Dizziness   . Metformin And Related Nausea Only    Nausea, dizziness    Social History   Socioeconomic History  . Marital status: Divorced    Spouse name: Not on file  . Number of children: Not on file  . Years of education: Not on file  . Highest education level: Not on file  Occupational History  . Not on file  Social Needs  . Financial resource strain: Somewhat hard  . Food insecurity:    Worry: Never true    Inability: Never true  . Transportation needs:    Medical: No    Non-medical: No  Tobacco Use  . Smoking status: Current Every Day Smoker    Packs/day: 0.50    Types: Cigarettes    Last attempt to quit: 07/17/2015    Years since quitting: 3.1  . Smokeless tobacco: Never Used  Substance and Sexual Activity  . Alcohol use: No  . Drug use: No  . Sexual activity: Never   Lifestyle  . Physical activity:    Days per week: 0 days    Minutes per session: 0 min  . Stress: Not at all  Relationships  . Social connections:    Talks on phone: More than three times a week    Gets together: Once a week    Attends religious service: Never    Active member of club or organization: No    Attends meetings of clubs or organizations: Never    Relationship status: Widowed  . Intimate partner violence:    Fear of current or ex partner: No    Emotionally abused: No    Physically abused: No  Forced sexual activity: No  Other Topics Concern  . Not on file  Social History Narrative  . Not on file     Family History  Problem Relation Age of Onset  . Heart disease Mother      Review of Systems: A 12-system review of systems was performed and is negative except as noted in the HPI.  Labs: Recent Labs    09/08/18 2029  TROPONINI <0.03   Lab Results  Component Value Date   WBC 10.1 09/09/2018   HGB 14.3 09/09/2018   HCT 45.7 09/09/2018   MCV 86.2 09/09/2018   PLT 143 (L) 09/09/2018    Recent Labs  Lab 09/10/18 0943  NA 137  K 3.8  CL 105  CO2 26  BUN 9  CREATININE 0.56  CALCIUM 8.2*  PROT 6.1*  BILITOT 0.6  ALKPHOS 124  ALT 28  AST 22  GLUCOSE 156*   Lab Results  Component Value Date   CHOL 177 03/24/2018   HDL 34 (L) 03/24/2018   LDLCALC 92 03/24/2018   TRIG 256 (H) 03/24/2018   No results found for: DDIMER  Radiology/Studies:  Dg Chest Portable 1 View  Result Date: 09/08/2018 CLINICAL DATA:  73 year old female with history of central chest pain which is sharp, starting 3 hours ago. No associated shortness of breath or dizziness. Vomiting and nausea for the past 3 hours. EXAM: PORTABLE CHEST 1 VIEW COMPARISON:  Chest x-ray 11/26/2016. FINDINGS: Lung volumes are normal. No consolidative airspace disease. No pleural effusions. No pneumothorax. No pulmonary nodule or mass noted. Pulmonary vasculature and the cardiomediastinal  silhouette are within normal limits. Aortic atherosclerosis. IMPRESSION: 1.  No radiographic evidence of acute cardiopulmonary disease. 2. Aortic atherosclerosis. Electronically Signed   By: Vinnie Langton M.D.   On: 09/08/2018 20:24   Ct Angio Chest/abd/pel For Dissection W And/or Wo Contrast  Result Date: 09/08/2018 CLINICAL DATA:  Central chest pain for several hours EXAM: CT ANGIOGRAPHY CHEST, ABDOMEN AND PELVIS TECHNIQUE: Multidetector CT imaging through the chest, abdomen and pelvis was performed using the standard protocol during bolus administration of intravenous contrast. Multiplanar reconstructed images and MIPs were obtained and reviewed to evaluate the vascular anatomy. CONTRAST:  138mL OMNIPAQUE IOHEXOL 350 MG/ML SOLN COMPARISON:  08/19/2017 FINDINGS: CTA CHEST FINDINGS Cardiovascular: Precontrast images demonstrate no hyperdense crescent within the thoracic aorta. Thoracic aorta demonstrates atherosclerotic calcifications without aneurysmal dilatation. Mild coronary calcifications are seen. No cardiac enlargement is noted. Mildly ulcerated plaque is noted within the descending thoracic aorta. No dissection is identified. The pulmonary artery as visualized shows no large central embolus. Mediastinum/Nodes: Thoracic inlet is within normal limits. No hilar or mediastinal adenopathy is noted. The esophagus is within normal limits. Lungs/Pleura: Lungs are well aerated bilaterally with diffuse emphysematous changes. No focal infiltrate or sizable effusion is seen. No sizable parenchymal nodule is noted. Musculoskeletal: Degenerative changes of the thoracic spine are noted. Old rib fractures are noted on the left. Review of the MIP images confirms the above findings. CTA ABDOMEN AND PELVIS FINDINGS VASCULAR Aorta: Abdominal aorta demonstrates calcified and noncalcified atherosclerotic plaque throughout its course. Focal intimal flap is noted in the infrarenal aorta consistent with chronic dissection.  Ulcerative plaque is noted as well. No occlusive changes are seen. No aneurysmal dilatation is seen. Celiac: Celiac axis demonstrates both calcified and noncalcified plaque at its origin with focal stenosis of approximately 50%. SMA: Patent without evidence of aneurysm, dissection, vasculitis or significant stenosis. Renals: Mild atherosclerotic changes are noted bilaterally. Early bifurcation  is noted on the right. Single renal arteries are noted bilaterally. IMA: Patent without evidence of aneurysm, dissection, vasculitis or significant stenosis. Iliacs: Atherosclerotic changes are noted without focal stenosis. Veins: No venous abnormality is noted. Review of the MIP images confirms the above findings. NON-VASCULAR Hepatobiliary: Liver is well visualized within normal limits. The gallbladder is well distended. Dependent density is noted likely related to small stones or gallbladder sludge. This is stable from the prior exam. Pancreas: Pancreas is well visualized and demonstrates mild peripancreatic inflammatory change consistent with mild acute pancreatitis. No pseudocyst is seen. No changes to suggest pancreatic necrosis are noted at this time. Spleen: Within normal limits. Adrenals/Urinary Tract: Adrenal glands are unremarkable. Kidneys are well visualized bilaterally within normal enhancement pattern. Some scarring is noted in the left kidney in the lower pole stable from the prior exam. Previously seen left UPJ stone is no longer identified. The bladder is well distended. Stomach/Bowel: Colon is decompressed. The appendix is well visualized and within normal limits. No small bowel or gastric abnormality is seen. Lymphatic: No significant lymphadenopathy is noted. Reproductive: Uterus has been surgically removed. No adnexal mass is noted. Other: No abdominal wall hernia or abnormality. No abdominopelvic ascites. Musculoskeletal: Degenerative changes of lumbar spine are seen. Chronic superior endplate L5  compression deformity is noted. Review of the MIP images confirms the above findings. IMPRESSION: CTA of the chest: No evidence of dissection is identified. No aneurysmal dilatation is seen. Atherosclerotic plaque with mild ulcerations are noted particularly in the descending thoracic aorta. Emphysematous changes without acute parenchymal abnormality. CTA of the abdomen and pelvis: Changes consistent with mild acute pancreatitis. No pseudocyst is identified. Significant calcified and noncalcified atherosclerotic plaque within the abdominal aorta. No aneurysmal dilatation is seen. 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 Dependent density within the gallbladder consistent with small stones or gallbladder sludge. Previously seen left UPJ stone is no longer identified. Electronically Signed   By: Inez Catalina M.D.   On: 09/08/2018 21:20   US Abdomen Limited Ruq  Result Date: 09/08/2018 CLINICAL DATA:  Right upper quadrant pain EXAM: ULTRASOUND ABDOMEN LIMITED RIGHT UPPER QUADRANT COMPARISON:  CT 08/19/2017 FINDINGS: Gallbladder: Shadowing stones in the gallbladder measuring up to 4 mm. Negative sonographic Murphy. Normal wall thickness Common bile duct: Diameter: 2.5 mm Liver: No focal lesion identified. Within normal limits in parenchymal echogenicity. Portal vein is patent on color Doppler imaging with normal direction of blood flow towards the liver. Small amount of fluid adjacent to the gallbladder. IMPRESSION: Layering gallstones with possible trace fluid adjacent to the gallbladder. Negative for sonographic Murphy or wall thickening. If acute cholecystitis remains a concern, correlation with nuclear medicine hepatobiliary imaging would be helpful for further assessment. Electronically Signed   By: Donavan Foil M.D.   On: 09/08/2018 23:31    Wt Readings from Last 3 Encounters:  09/09/18 93.4 kg  05/05/18 93.4 kg  03/24/18 93.1 kg    EKG: Normal sinus rhythm with no ischemia  Physical Exam:  Blood  pressure (!) 128/52, pulse 75, temperature 98.1 F (36.7 C), temperature source Oral, resp. rate 18, height 5\' 5"  (1.651 m), weight 93.4 kg, SpO2 96 %. Body mass index is 34.27 kg/m. General: Well developed, well nourished, in no acute distress. Head: Normocephalic, atraumatic, sclera non-icteric, no xanthomas, nares are without discharge.  Neck: Negative for carotid bruits. JVD not elevated. Lungs: Clear bilaterally to auscultation without wheezes, rales, or rhonchi. Breathing is unlabored. Heart: RRR with S1 S2. No murmurs, rubs, or  gallops appreciated. Abdomen: Soft, non-tender, non-distended with normoactive bowel sounds. No hepatomegaly. No rebound/guarding. No obvious abdominal masses. Msk:  Strength and tone appear normal for age. Extremities: No clubbing or cyanosis. No edema.  Distal pedal pulses are 2+ and equal bilaterally. Neuro: Alert and oriented X 3. No facial asymmetry. No focal deficit. Moves all extremities spontaneously. Psych:  Responds to questions appropriately with a normal affect.     Assessment and Plan  37-year-old female with history of coronary disease with her occluded RCA and circumflex with good collaterals treated medically.  She has no anginal symptoms as an outpatient on current regimen including carvedilol, Imdur, aspirin.  She has preserved LV function by echo last year with an EF of 50 to 55%.  She has acute gallbladder pancreatitis.  She will need surgical intervention in the morning.  She appears to be at moderate risk from a cardiac standpoint but optimized.  Would proceed with surgery with routine cardiac monitoring with no further ischemic work-up.  Would continue with current meds.  Will follow along with you postop.  Signed, Teodoro Spray MD 09/11/2018, 2:10 PM Pager: [phone]

## 2018-09-12 ENCOUNTER — Other Ambulatory Visit (HOSPITAL_COMMUNITY): Payer: Medicare Other

## 2018-09-12 ENCOUNTER — Inpatient Hospital Stay
Admit: 2018-09-12 | Discharge: 2018-09-12 | Disposition: A | Discharge status: Home / Self Care | Payer: Medicare Other | Attending: Internal Medicine | Admitting: Internal Medicine

## 2018-09-12 ENCOUNTER — Encounter
Admission: EM | Disposition: A | Discharge status: Home / Self Care | Payer: Self-pay | Source: Home / Self Care | Attending: Internal Medicine

## 2018-09-12 ENCOUNTER — Encounter: Payer: Self-pay | Admitting: Certified Registered Nurse Anesthetist

## 2018-09-12 ENCOUNTER — Inpatient Hospital Stay: Payer: Medicare Other | Admitting: Certified Registered Nurse Anesthetist

## 2018-09-12 ENCOUNTER — Inpatient Hospital Stay: Payer: Medicare Other

## 2018-09-12 HISTORY — PX: CHOLECYSTECTOMY: SHX55

## 2018-09-12 LAB — GLUCOSE, CAPILLARY
GLUCOSE-CAPILLARY: 178 mg/dL — AB (ref 70–99)
GLUCOSE-CAPILLARY: 203 mg/dL — AB (ref 70–99)
Glucose-Capillary: 132 mg/dL — ABNORMAL HIGH (ref 70–99)
Glucose-Capillary: 121 mg/dL — ABNORMAL HIGH (ref 70–99)

## 2018-09-12 LAB — SURGICAL PCR SCREEN
MRSA, PCR: POSITIVE — AB
Staphylococcus aureus: POSITIVE — AB

## 2018-09-12 LAB — ECHOCARDIOGRAM COMPLETE
Height: 65 in
Weight: 3294.55 oz

## 2018-09-12 LAB — LIPASE, BLOOD: Lipase: 24 U/L (ref 11–51)

## 2018-09-12 SURGERY — LAPAROSCOPIC CHOLECYSTECTOMY WITH INTRAOPERATIVE CHOLANGIOGRAM
Anesthesia: General

## 2018-09-12 MED ORDER — SODIUM CHLORIDE 0.9 % IV SOLN
INTRAVENOUS | Status: DC | PRN
  Administered 2018-09-12: 15 mL

## 2018-09-12 MED ORDER — FENTANYL CITRATE (PF) 100 MCG/2ML IJ SOLN
INTRAMUSCULAR | Status: AC
  Filled 2018-09-12: qty 2

## 2018-09-12 MED ORDER — ACETAMINOPHEN 10 MG/ML IV SOLN
INTRAVENOUS | Status: DC | PRN
  Administered 2018-09-12: 1000 mg via INTRAVENOUS

## 2018-09-12 MED ORDER — LACTATED RINGERS IV SOLN
INTRAVENOUS | Status: DC | PRN
  Administered 2018-09-12: 13:00:00 via INTRAVENOUS

## 2018-09-12 MED ORDER — DEXAMETHASONE SODIUM PHOSPHATE 10 MG/ML IJ SOLN
INTRAMUSCULAR | Status: DC | PRN
  Administered 2018-09-12: 5 mg via INTRAVENOUS

## 2018-09-12 MED ORDER — SUGAMMADEX SODIUM 200 MG/2ML IV SOLN
INTRAVENOUS | Status: DC | PRN
  Administered 2018-09-12: 200 mg via INTRAVENOUS

## 2018-09-12 MED ORDER — CEFAZOLIN SODIUM-DEXTROSE 2-4 GM/100ML-% IV SOLN
INTRAVENOUS | Status: AC
  Filled 2018-09-12: qty 100

## 2018-09-12 MED ORDER — PHENYLEPHRINE HCL 10 MG/ML IJ SOLN
INTRAMUSCULAR | Status: DC | PRN
  Administered 2018-09-12 (×2): 100 ug via INTRAVENOUS
  Administered 2018-09-12: 200 ug via INTRAVENOUS
  Administered 2018-09-12 (×2): 100 ug via INTRAVENOUS

## 2018-09-12 MED ORDER — ONDANSETRON HCL 4 MG/2ML IJ SOLN: 4.0000 mg | Freq: Once | INTRAMUSCULAR | Status: DC | PRN

## 2018-09-12 MED ORDER — POLYVINYL ALCOHOL 1.4 % OP SOLN
1.0000 [drp] | OPHTHALMIC | Status: DC | PRN
  Administered 2018-09-13: 1 [drp] via OPHTHALMIC
  Filled 2018-09-12: qty 15

## 2018-09-12 MED ORDER — CEFAZOLIN SODIUM-DEXTROSE 2-4 GM/100ML-% IV SOLN
2.0000 g | Freq: Once | INTRAVENOUS | Status: AC
  Administered 2018-09-12: 2 g via INTRAVENOUS

## 2018-09-12 MED ORDER — BUPIVACAINE-EPINEPHRINE (PF) 0.5% -1:200000 IJ SOLN
INTRAMUSCULAR | Status: DC | PRN
  Administered 2018-09-12: 20 mL via PERINEURAL

## 2018-09-12 MED ORDER — CHLORHEXIDINE GLUCONATE CLOTH 2 % EX PADS
6.0000 | MEDICATED_PAD | Freq: Every day | CUTANEOUS | Status: DC
  Administered 2018-09-12 – 2018-09-14 (×3): 6 via TOPICAL

## 2018-09-12 MED ORDER — IPRATROPIUM-ALBUTEROL 20-100 MCG/ACT IN AERS: INHALATION_SPRAY | RESPIRATORY_TRACT | Status: DC | PRN

## 2018-09-12 MED ORDER — FENTANYL CITRATE (PF) 100 MCG/2ML IJ SOLN
25.0000 ug | INTRAMUSCULAR | Status: DC | PRN
  Administered 2018-09-12: 25 ug via INTRAVENOUS

## 2018-09-12 MED ORDER — ONDANSETRON HCL 4 MG/2ML IJ SOLN
INTRAMUSCULAR | Status: DC | PRN
  Administered 2018-09-12: 4 mg via INTRAVENOUS

## 2018-09-12 MED ORDER — LIDOCAINE HCL (CARDIAC) PF 100 MG/5ML IV SOSY
PREFILLED_SYRINGE | INTRAVENOUS | Status: DC | PRN
  Administered 2018-09-12: 80 mg via INTRAVENOUS

## 2018-09-12 MED ORDER — FENTANYL CITRATE (PF) 100 MCG/2ML IJ SOLN
INTRAMUSCULAR | Status: DC | PRN
  Administered 2018-09-12: 50 ug via INTRAVENOUS

## 2018-09-12 MED ORDER — ROCURONIUM BROMIDE 100 MG/10ML IV SOLN
INTRAVENOUS | Status: DC | PRN
  Administered 2018-09-12: 40 mg via INTRAVENOUS

## 2018-09-12 MED ORDER — ALBUTEROL SULFATE HFA 108 (90 BASE) MCG/ACT IN AERS
INHALATION_SPRAY | RESPIRATORY_TRACT | Status: DC | PRN
  Administered 2018-09-12: 8 via RESPIRATORY_TRACT
  Administered 2018-09-12: 4 via RESPIRATORY_TRACT

## 2018-09-12 MED ORDER — PROPOFOL 10 MG/ML IV BOLUS
INTRAVENOUS | Status: DC | PRN
  Administered 2018-09-12: 120 mg via INTRAVENOUS

## 2018-09-12 SURGICAL SUPPLY — 42 items
APPLIER CLIP 5 13 M/L LIGAMAX5 (MISCELLANEOUS) ×3
BLADE SURG SZ11 CARB STEEL (BLADE) ×3 IMPLANT
CANISTER SUCT 1200ML W/VALVE (MISCELLANEOUS) ×3 IMPLANT
CATH CHOLANG 76X19 KUMAR (CATHETERS) ×3 IMPLANT
CHLORAPREP W/TINT 26ML (MISCELLANEOUS) ×3 IMPLANT
CLIP APPLIE 5 13 M/L LIGAMAX5 (MISCELLANEOUS) ×1 IMPLANT
COVER WAND RF STERILE (DRAPES) IMPLANT
DERMABOND ADVANCED (GAUZE/BANDAGES/DRESSINGS) ×2
DERMABOND ADVANCED .7 DNX12 (GAUZE/BANDAGES/DRESSINGS) ×1 IMPLANT
DRSG TEGADERM 2-3/8X2-3/4 SM (GAUZE/BANDAGES/DRESSINGS) ×3 IMPLANT
ELECT REM PT RETURN 9FT ADLT (ELECTROSURGICAL) ×3
ELECTRODE REM PT RTRN 9FT ADLT (ELECTROSURGICAL) ×1 IMPLANT
GLOVE BIO SURGEON STRL SZ 6.5 (GLOVE) ×6 IMPLANT
GLOVE BIO SURGEONS STRL SZ 6.5 (GLOVE) ×3
GLOVE BIOGEL PI IND STRL 6.5 (GLOVE) ×3 IMPLANT
GLOVE BIOGEL PI INDICATOR 6.5 (GLOVE) ×6
GOWN STRL REUS W/ TWL LRG LVL3 (GOWN DISPOSABLE) ×3 IMPLANT
GOWN STRL REUS W/TWL LRG LVL3 (GOWN DISPOSABLE) ×6
GRASPER SUT TROCAR 14GX15 (MISCELLANEOUS) IMPLANT
HEMOSTAT SURGICEL 2X3 (HEMOSTASIS) IMPLANT
IRRIGATION STRYKERFLOW (MISCELLANEOUS) ×1 IMPLANT
IRRIGATOR STRYKERFLOW (MISCELLANEOUS) ×3
IV NS 1000ML (IV SOLUTION) ×2
IV NS 1000ML BAXH (IV SOLUTION) ×1 IMPLANT
KIT TURNOVER KIT A (KITS) ×3 IMPLANT
LABEL OR SOLS (LABEL) ×3 IMPLANT
NEEDLE HYPO 25X1 1.5 SAFETY (NEEDLE) ×3 IMPLANT
NEEDLE INSUFFLATION 14GA 120MM (NEEDLE) ×3 IMPLANT
NS IRRIG 500ML POUR BTL (IV SOLUTION) ×3 IMPLANT
PACK LAP CHOLECYSTECTOMY (MISCELLANEOUS) ×3 IMPLANT
POUCH SPECIMEN RETRIEVAL 10MM (ENDOMECHANICALS) ×3 IMPLANT
SCISSORS METZENBAUM CVD 33 (INSTRUMENTS) ×3 IMPLANT
SLEEVE ENDOPATH XCEL 5M (ENDOMECHANICALS) ×6 IMPLANT
SUT MNCRL 4-0 (SUTURE) ×2
SUT MNCRL 4-0 27XMFL (SUTURE) ×1
SUT MNCRL AB 4-0 PS2 18 (SUTURE) ×3 IMPLANT
SUT VIC AB 0 CT1 36 (SUTURE) IMPLANT
SUT VICRYL 0 AB UR-6 (SUTURE) ×3 IMPLANT
SUTURE MNCRL 4-0 27XMF (SUTURE) ×1 IMPLANT
TROCAR XCEL NON-BLD 11X100MML (ENDOMECHANICALS) ×3 IMPLANT
TROCAR XCEL NON-BLD 5MMX100MML (ENDOMECHANICALS) ×3 IMPLANT
TUBING INSUFFLATION (TUBING) ×3 IMPLANT

## 2018-09-12 NOTE — Progress Notes (Signed)
*  PRELIMINARY RESULTS* Echocardiogram 2D Echocardiogram has been performed.  Sarah White 09/12/2018, 9:01 AM

## 2018-09-12 NOTE — Anesthesia Postprocedure Evaluation (Signed)
Anesthesia Post Note  Patient: Sarah White  Procedure(s) Performed: LAPAROSCOPIC CHOLECYSTECTOMY WITH INTRAOPERATIVE CHOLANGIOGRAM (N/A )  Patient location during evaluation: PACU Anesthesia Type: General Level of consciousness: oriented and awake and alert Pain management: pain level controlled Vital Signs Assessment: post-procedure vital signs reviewed and stable Respiratory status: spontaneous breathing Cardiovascular status: blood pressure returned to baseline Anesthetic complications: no     Last Vitals:  Vitals:   09/12/18 1620 09/12/18 1657  BP:  139/76  Pulse:    Resp:    Temp: 36.4 C   SpO2:      Last Pain:  Vitals:   09/12/18 1620  TempSrc:   PainSc: Asleep                 Rosalia Mcavoy

## 2018-09-12 NOTE — Anesthesia Procedure Notes (Signed)
Procedure Name: Intubation Date/Time: 09/12/2018 1:14 PM Performed by: Willette Alma, CRNA Pre-anesthesia Checklist: Patient identified, Patient being monitored, Timeout performed, Emergency Drugs available and Suction available Patient Re-evaluated:Patient Re-evaluated prior to induction Oxygen Delivery Method: Circle system utilized Preoxygenation: Pre-oxygenation with 100% oxygen Induction Type: IV induction Ventilation: Mask ventilation without difficulty Laryngoscope Size: Mac and 3 Grade View: Grade I Tube type: Oral Tube size: 7.0 mm Number of attempts: 1 Airway Equipment and Method: Stylet Placement Confirmation: ETT inserted through vocal cords under direct vision,  positive ETCO2 and breath sounds checked- equal and bilateral Secured at: 21 cm Tube secured with: Tape Dental Injury: Teeth and Oropharynx as per pre-operative assessment  Comments: Atraumatic DL x 1 Lips and teeth as before.

## 2018-09-12 NOTE — Op Note (Signed)
Preoperative diagnosis: Biliary pancreatitis.  Postoperative diagnosis: Biliary pancreatitis.  Procedure: Laparoscopic Cholecystectomy with intra operative cholangiogram.   Anesthesia: GETA   Surgeon: Dr. Windell Moment  Wound Classification: Clean Contaminated  Indications: Patient is a 73 y.o. female developed right upper quadrant pain, nausea, vomiting, elevated lipase and on workup was found to have sludge with a normal common duct and diagnosed with biliary pancreatitis. Laparoscopic cholecystectomy with intra operative cholangiogram was elected.  Findings: Critical view of safety achieved No filling defect identified on cholangiogram Contrast reflux to pancreas was identified Cystic duct and artery identified, ligated and divided Adequate hemostasis  Description of procedure: The patient was placed on the operating table in the supine position. General anesthesia was induced. A time-out was completed verifying correct patient, procedure, site, positioning, and implant(s) and/or special equipment prior to beginning this procedure. An orogastric tube was placed. The abdomen was prepped and draped in the usual sterile fashion.  An incision was made in a natural skin line above the umbilicus.  The fascia was elevated and the Veress needle inserted. Proper position was confirmed by aspiration and saline meniscus test.  The abdomen was insufflated with carbon dioxide to a pressure of 15 mmHg. The patient tolerated insufflation well. A 11-mm trocar was then inserted.  The laparoscope was inserted and the abdomen inspected. No injuries from initial trocar placement were noted. Additional trocars were then inserted in the following locations: a 5-mm trocar in the right epigastrium and two 5-mm trocars along the right costal margin. The abdomen was inspected and no abnormalities were found. The table was placed in the reverse Trendelenburg position with the right side up.  Filmy adhesions between  the gallbladder and omentum, duodenum and transverse colon were lysed sharply. The dome of the gallbladder was grasped with an atraumatic grasper passed through the lateral port and retracted over the dome of the liver. The infundibulum was also grasped with an atraumatic grasper through the midclavicular port and retracted toward the right lower quadrant. This maneuver exposed Calot's triangle. The peritoneum overlying the gallbladder infundibulum was then incised and the cystic duct and cystic artery identified and circumferentially dissected.  With a Kumar clamp, the infundibulum was grasped. Catheter was inserted. Bile was aspirated and saline flushed without resistance. Contrast was then flushed and fluoroscopy images were reviewed. No contrast extravasation or filling defect was identified. Kumar catheter and clamp removed.   The infundibulum was then grasped again with atraumatic grasper Critical view of safety reviewed before ligating any structure. The cystic duct and cystic artery were then doubly clipped and divided close to the gallbladder.  The gallbladder was then dissected from its peritoneal attachments by electrocautery. Hemostasis was checked and the gallbladder and contained stones were removed using an endoscopic retrieval bag placed through the umbilical port. The gallbladder was passed off the table as a specimen. The gallbladder fossa was copiously irrigated with saline and hemostasis was obtained. There was no evidence of bleeding from the gallbladder fossa or cystic artery or leakage of the bile from the cystic duct stump. Secondary trocars were removed under direct vision. No bleeding was noted. The laparoscope was withdrawn and the umbilical trocar removed. The abdomen was allowed to collapse. The fascia of the 4mm trocar sites was closed with figure-of-eight 0 vicryl sutures. The skin was closed with subcuticular sutures of 4-0 monocryl and topical skin adhesive. The orogastric tube  was removed.  The patient tolerated the procedure well and was taken to the postanesthesia care unit in  stable condition.   Specimen: Gallbladder  Complications: None  EBL:  5 mL

## 2018-09-12 NOTE — Progress Notes (Signed)
Pt arrived back to floor around 1645. Pt rates pain 10/10, but is drowsy. 4 incisions CDI with skin glue, 1 incision CDI with gauze & tegaderm. Pt ambulated with assist to [facility]. VSS, 3L O2. Pt educated on plan of care.

## 2018-09-12 NOTE — Transfer of Care (Signed)
Immediate Anesthesia Transfer of Care Note  Patient: Sarah White  Procedure(s) Performed: Procedure(s): LAPAROSCOPIC CHOLECYSTECTOMY WITH INTRAOPERATIVE CHOLANGIOGRAM (N/A)  Patient Location: PACU and Endoscopy Unit  Anesthesia Type:General  Level of Consciousness: sedated  Airway & Oxygen Therapy: Patient Spontanous Breathing and Patient connected to nasal cannula oxygen  Post-op Assessment: Report given to RN and Post -op Vital signs reviewed and stable  Post vital signs: Reviewed and stable  Last Vitals:  Vitals:   09/12/18 1245 09/12/18 1520  BP:  (!) 167/69  Pulse: 76 78  Resp: 18 (!) 23  Temp: (!) 36.3 C 36.9 C  SpO2: 22% 02%    Complications: No apparent anesthesia complications

## 2018-09-12 NOTE — Anesthesia Preprocedure Evaluation (Signed)
Anesthesia Evaluation  Patient identified by MRN, date of birth, ID band Patient awake    Reviewed: Allergy & Precautions, H&P , NPO status , Patient's Chart, lab work & pertinent test results, reviewed documented beta blocker date and time   Airway Mallampati: III  TM Distance: >3 FB Neck ROM: full    Dental  (+) Upper Dentures, Lower Dentures   Pulmonary pneumonia, COPD,  COPD inhaler, Current Smoker,    Pulmonary exam normal        Cardiovascular Exercise Tolerance: Poor hypertension, On Medications + angina with exertion + CAD, + Cardiac Stents and +CHF  Normal cardiovascular exam Rhythm:regular Rate:Normal     Neuro/Psych PSYCHIATRIC DISORDERS Depression negative neurological ROS     GI/Hepatic negative GI ROS, Neg liver ROS,   Endo/Other  negative endocrine ROSdiabetes, Type 2, Oral Hypoglycemic Agents  Renal/GU Renal disease  negative genitourinary   Musculoskeletal   Abdominal   Peds  Hematology negative hematology ROS (+)   Anesthesia Other Findings Past Medical History: 07/22/2015: Acute respiratory failure with hypoxia and hypercarbia  (HCC) No date: Cancer ([place])     Comment:  uterine No date: CHF (congestive heart failure) (HCC) No date: COPD (chronic obstructive pulmonary disease) ([place]) No date: Diabetes mellitus without complication ([place]) 16/06/9603: Encephalopathy acute No date: Hypertension No date: Pneumonia     Comment:  November 2016 07/22/2015: Pulmonary edema No date: Squamous cell cancer of skin of upper arm, right Past Surgical History: No date: ABDOMINAL HYSTERECTOMY No date: CARPAL TUNNEL RELEASE; Bilateral No date: CESAREAN SECTION No date: CORONARY ANGIOPLASTY WITH STENT PLACEMENT No date: KNEE SURGERY; Left 11/26/2016: LEFT HEART CATH AND CORONARY ANGIOGRAPHY; Right     Comment:  Procedure: Left Heart Cath and Coronary Angiography;                Surgeon: Isaias Cowman,  MD;  Location: [facility];  Service: Cardiovascular;  Laterality:               Right; BMI    Body Mass Index:  34.27 kg/m     Reproductive/Obstetrics negative OB ROS                             Anesthesia Physical Anesthesia Plan  ASA: III and emergent  Anesthesia Plan: General ETT   Post-op Pain Management:    Induction:   PONV Risk Score and Plan: 4 or greater and 3  Airway Management Planned:   Additional Equipment:   Intra-op Plan:   Post-operative Plan:   Informed Consent: I have reviewed the patients History and Physical, chart, labs and discussed the procedure including the risks, benefits and alternatives for the proposed anesthesia with the patient or authorized representative who has indicated his/her understanding and acceptance.   Dental Advisory Given  Plan Discussed with: CRNA  Anesthesia Plan Comments:         Anesthesia Quick Evaluation

## 2018-09-12 NOTE — Plan of Care (Signed)

## 2018-09-12 NOTE — Progress Notes (Signed)
[facility] NAME: Sarah White    MR#:  [id number]  DATE OF BIRTH:  1945-10-11  SUBJECTIVE:  CHIEF COMPLAINT: Patient is feeling much better abdominal pain is better, lipase is normal and cleared by cardiology for surgery  REVIEW OF SYSTEMS:  CONSTITUTIONAL: No fever, fatigue or weakness.  EYES: No blurred or double vision.  EARS, NOSE, AND THROAT: No tinnitus or ear pain.  RESPIRATORY: No cough, shortness of breath, wheezing or hemoptysis.  CARDIOVASCULAR: No chest pain, orthopnea, edema.  GASTROINTESTINAL: Reports nausea but denies any vomiting, has no epigastric abdominal discomfort  GENITOURINARY: No dysuria, hematuria.  ENDOCRINE: No polyuria, nocturia,  HEMATOLOGY: No anemia, easy bruising or bleeding SKIN: No rash or lesion. MUSCULOSKELETAL: No joint pain or arthritis.   NEUROLOGIC: No tingling, numbness, weakness.  PSYCHIATRY: No anxiety or depression.   DRUG ALLERGIES:   Allergies  Allergen Reactions  . Amlodipine Swelling  . Codeine Nausea And Vomiting  . Hctz [Hydrochlorothiazide] Other (See Comments)    Hypokalemia  . Lisinopril Other (See Comments)    Dizziness   . Metformin And Related Nausea Only    Nausea, dizziness    VITALS:  Blood pressure (!) 159/56, pulse 74, temperature 98.1 F (36.7 C), temperature source Oral, resp. rate 19, height 5\' 5"  (1.651 m), weight 93.4 kg, SpO2 96 %.  PHYSICAL EXAMINATION:  GENERAL:  73 y.o.-year-old patient lying in the bed with no acute distress.  EYES: Pupils equal, round, reactive to light and accommodation. No scleral icterus. Extraocular muscles intact.  HEENT: Head atraumatic, normocephalic. Oropharynx and nasopharynx clear.  NECK:  Supple, no jugular venous distention. No thyroid enlargement, no tenderness.  LUNGS: Normal breath sounds bilaterally, no wheezing, rales,rhonchi or crepitation. No use of accessory muscles of respiration.  CARDIOVASCULAR:  S1, S2 normal. No murmurs, rubs, or gallops.  ABDOMEN: Soft, minimal epigastric tenderness no rebound tenderness, nondistended. Bowel sounds present.  EXTREMITIES: No pedal edema, cyanosis, or clubbing.  NEUROLOGIC: Cranial nerves II through XII are intact. Muscle strength 5/5 in all extremities. Sensation intact. Gait not checked.  PSYCHIATRIC: The patient is alert and oriented x 3.  SKIN: No obvious rash, lesion, or ulcer.    LABORATORY PANEL:   CBC Recent Labs  Lab 09/09/18 0657  WBC 10.1  HGB 14.3  HCT 45.7  PLT 143*   ------------------------------------------------------------------------------------------------------------------  Chemistries  Recent Labs  Lab 09/10/18 0943  NA 137  K 3.8  CL 105  CO2 26  GLUCOSE 156*  BUN 9  CREATININE 0.56  CALCIUM 8.2*  AST 22  ALT 28  ALKPHOS 124  BILITOT 0.6   ------------------------------------------------------------------------------------------------------------------  Cardiac Enzymes Recent Labs  Lab 09/08/18 2029  TROPONINI <0.03   ------------------------------------------------------------------------------------------------------------------  RADIOLOGY:  No results found.  EKG:   Orders placed or performed during the hospital encounter of 09/08/18  . EKG 12-Lead  . EKG 12-Lead    ASSESSMENT AND PLAN:    Acute gallstone pancreatitis - Clinically doing fine, n.p.o.,after midnight  Scheduled for cholecystectomy today  continue aggressive hydration with IV fluids  LFTs are trending down  Pain management as needed  lipase levels - nml range  Seen by Dr. Ubaldo Glassing and cardiology cleared her for surgery   Chronic COPD with no exacerbation-continue home inhalers and breathing treatments as needed   -  Diabetes mellitus with chronic kidney disease ([place]) -sliding scale insulin    CAD (coronary artery disease) -patient is asymptomatic continue home meds Imdur, Coreg,  nitroglycerin as needed LDL 92, not  on statin Echocardiogram from March 2018 with 50 to 55% ejection fraction. Rpt echo pending         All the records are reviewed and case discussed with Care Management/Social Workerr. Management plans discussed with the patient, she is  in agreement.  CODE STATUS:   TOTAL TIME TAKING CARE OF THIS PATIENT: 36 minutes.   POSSIBLE D/C IN 2 DAYS, DEPENDING ON CLINICAL CONDITION.  Note: This dictation was prepared with Dragon dictation along with smaller phrase technology. Any transcriptional errors that result from this process are unintentional.   Nicholes Mango M.D on 09/12/2018 at 10:29 AM  Between 7am to 6pm - Pager - [phone] After 6pm go to [email or web address] - password [email or web address] [facility]  Office  [phone]  CC: Primary care physician; Valerie Roys, DO

## 2018-09-12 NOTE — Progress Notes (Signed)
[facility] Hospital Day(s): 3.   Post op day(s):  Marland Kitchen   Interval History: Patient seen and examined, no acute events or new complaints overnight. Patient reports pain is significantly improved, denies nausea or vomiting. Tolerated clear liquids yesterday.  Vital signs in last 24 hours: [min-max] current  Temp:  [97.9 F (36.6 C)-98.4 F (36.9 C)] 98.1 F (36.7 C) (01/13 0811) Pulse Rate:  [72-82] 74 (01/13 0811) Resp:  [18-19] 19 (01/12 2311) BP: (128-178)/(52-70) 159/56 (01/13 0811) SpO2:  [92 %-98 %] 96 % (01/13 0811)     Height: 5\' 5"  (165.1 cm) Weight: 93.4 kg BMI (Calculated): 34.27   Physical Exam:  Constitutional: alert, cooperative and no distress  Respiratory: breathing non-labored at rest  Cardiovascular: regular rate and sinus rhythm  Gastrointestinal: soft, non-tender, and non-distended  Labs:  CBC Latest Ref Rng & Units 09/09/2018 09/08/2018 03/24/2018  WBC 4.0 - 10.5 K/uL 10.1 10.2 7.6  Hemoglobin 12.0 - 15.0 g/dL 14.3 15.3(H) 15.5  Hematocrit 36.0 - 46.0 % 45.7 48.4(H) 48.8(H)  Platelets 150 - 400 K/uL 143(L) 152 147(L)   CMP Latest Ref Rng & Units 09/10/2018 09/09/2018 09/08/2018  Glucose 70 - 99 mg/dL 156(H) 275(H) 341(H)  BUN 8 - 23 mg/dL 9 11 11   Creatinine 0.44 - 1.00 mg/dL 0.56 0.63 0.70  Sodium 135 - 145 mmol/L 137 137 134(L)  Potassium 3.5 - 5.1 mmol/L 3.8 4.1 4.1  Chloride 98 - 111 mmol/L 105 105 102  CO2 22 - 32 mmol/L 26 26 24   Calcium 8.9 - 10.3 mg/dL 8.2(L) 8.4(L) 8.8(L)  Total Protein 6.5 - 8.1 g/dL 6.1(L) - 6.8  Total Bilirubin 0.3 - 1.2 mg/dL 0.6 - 0.9  Alkaline Phos 38 - 126 U/L 124 - 170(H)  AST 15 - 41 U/L 22 - 86(H)  ALT 0 - 44 U/L 28 - 48(H)    Imaging studies: No new pertinent imaging studies   Assessment/Plan:  73 y.o.femalewith gallstone pancreatitis, complicated by pertinent comorbidities includingCOPD, coronary artery disease,congestive heart failure,diabetes mellitus.  Patient today with no abdominal pain.  Lipase is normal. No electrolyte abnormality. All these are sing of resolved acute pancreatitis. Patient was cleared by Cardiologist. Will proceed with cholecystectomy today.   Arnold Long, MD

## 2018-09-12 NOTE — Anesthesia Post-op Follow-up Note (Signed)
Anesthesia QCDR form completed.        

## 2018-09-13 ENCOUNTER — Encounter: Payer: Self-pay | Admitting: General Surgery

## 2018-09-13 LAB — COMPREHENSIVE METABOLIC PANEL
ALK PHOS: 117 U/L (ref 38–126)
ALT: 33 U/L (ref 0–44)
AST: 31 U/L (ref 15–41)
Albumin: 3.4 g/dL — ABNORMAL LOW (ref 3.5–5.0)
Anion gap: 9 (ref 5–15)
BUN: 9 mg/dL (ref 8–23)
CO2: 28 mmol/L (ref 22–32)
Calcium: 8.5 mg/dL — ABNORMAL LOW (ref 8.9–10.3)
Chloride: 99 mmol/L (ref 98–111)
Creatinine, Ser: 0.53 mg/dL (ref 0.44–1.00)
GFR calc Af Amer: >60 mL/min (ref >60)
GFR calc non Af Amer: >60 mL/min (ref >60)
Glucose, Bld: 165 mg/dL — ABNORMAL HIGH (ref 70–99)
Potassium: 3.1 mmol/L — ABNORMAL LOW (ref 3.5–5.1)
Sodium: 136 mmol/L (ref 135–145)
Total Bilirubin: 0.7 mg/dL (ref 0.3–1.2)
Total Protein: 6.6 g/dL (ref 6.5–8.1)

## 2018-09-13 LAB — CBC
HCT: 44.7 % (ref 36.0–46.0)
HEMOGLOBIN: 14.3 g/dL (ref 12.0–15.0)
MCH: 27.2 pg (ref 26.0–34.0)
MCHC: 32 g/dL (ref 30.0–36.0)
MCV: 85.1 fL (ref 80.0–100.0)
Platelets: 166 10*3/uL (ref 150–400)
RBC: 5.25 MIL/uL — AB (ref 3.87–5.11)
RDW: 14.1 % (ref 11.5–15.5)
WBC: 9.5 10*3/uL (ref 4.0–10.5)
nRBC: 0 % (ref 0.0–0.2)

## 2018-09-13 LAB — GLUCOSE, CAPILLARY
GLUCOSE-CAPILLARY: 180 mg/dL — AB (ref 70–99)
Glucose-Capillary: 145 mg/dL — ABNORMAL HIGH (ref 70–99)
Glucose-Capillary: 150 mg/dL — ABNORMAL HIGH (ref 70–99)
Glucose-Capillary: 179 mg/dL — ABNORMAL HIGH (ref 70–99)
Glucose-Capillary: 236 mg/dL — ABNORMAL HIGH (ref 70–99)

## 2018-09-13 LAB — LIPASE, BLOOD: Lipase: 22 U/L (ref 11–51)

## 2018-09-13 MED ORDER — POTASSIUM CHLORIDE CRYS ER 20 MEQ PO TBCR
40.0000 meq | EXTENDED_RELEASE_TABLET | Freq: Once | ORAL | Status: AC
  Administered 2018-09-13: 40 meq via ORAL
  Filled 2018-09-13: qty 2

## 2018-09-13 MED ORDER — PANTOPRAZOLE SODIUM 40 MG PO TBEC
40.0000 mg | DELAYED_RELEASE_TABLET | Freq: Every day | ORAL | Status: DC
  Administered 2018-09-13 – 2018-09-14 (×2): 40 mg via ORAL
  Filled 2018-09-13 (×2): qty 1

## 2018-09-13 NOTE — Progress Notes (Signed)
[facility] Hospital Day(s): 4.   Post op day(s): 1 Day Post-Op.   Interval History: Patient seen and examined, no acute events or new complaints overnight. Patient reports minimal pain on wounds. No significant abdominal pain. Also complain of back pain but that is a chronic issue, denies nausea or vomiting.   Vital signs in last 24 hours: [min-max] current  Temp:  [97.4 F (36.3 C)-98.4 F (36.9 C)] 98.1 F (36.7 C) (01/14 0402) Pulse Rate:  [71-82] 75 (01/14 0402) Resp:  [13-23] 16 (01/14 0402) BP: (137-186)/(54-83) 169/70 (01/14 0402) SpO2:  [90 %-99 %] 95 % (01/14 0402)     Height: 5\' 5"  (165.1 cm) Weight: 93.4 kg BMI (Calculated): 34.27    Physical Exam:  Constitutional: alert, cooperative and no distress  Respiratory: breathing non-labored at rest  Cardiovascular: regular rate and sinus rhythm  Gastrointestinal: soft, non-tender, and non-distended. Wounds dry and clean.   Labs:  CBC Latest Ref Rng & Units 09/09/2018 09/08/2018 03/24/2018  WBC 4.0 - 10.5 K/uL 10.1 10.2 7.6  Hemoglobin 12.0 - 15.0 g/dL 14.3 15.3(H) 15.5  Hematocrit 36.0 - 46.0 % 45.7 48.4(H) 48.8(H)  Platelets 150 - 400 K/uL 143(L) 152 147(L)   CMP Latest Ref Rng & Units 09/10/2018 09/09/2018 09/08/2018  Glucose 70 - 99 mg/dL 156(H) 275(H) 341(H)  BUN 8 - 23 mg/dL 9 11 11   Creatinine 0.44 - 1.00 mg/dL 0.56 0.63 0.70  Sodium 135 - 145 mmol/L 137 137 134(L)  Potassium 3.5 - 5.1 mmol/L 3.8 4.1 4.1  Chloride 98 - 111 mmol/L 105 105 102  CO2 22 - 32 mmol/L 26 26 24   Calcium 8.9 - 10.3 mg/dL 8.2(L) 8.4(L) 8.8(L)  Total Protein 6.5 - 8.1 g/dL 6.1(L) - 6.8  Total Bilirubin 0.3 - 1.2 mg/dL 0.6 - 0.9  Alkaline Phos 38 - 126 U/L 124 - 170(H)  AST 15 - 41 U/L 22 - 86(H)  ALT 0 - 44 U/L 28 - 48(H)    Imaging studies: No new pertinent imaging studies   Assessment/Plan:  73 y.o.femalewith gallstone pancreatitis status post laparoscopic cholecystectomy with intra operative cholangiogtram,  complicated by pertinent comorbidities includingCOPD, coronary artery disease,congestive heart failure,diabetes mellitus.  Patient woke up with controlled pain. Tolerated full liquid without pain. Will advance diet to soft diet. Patient refers she got out of bed.   There is a lipase in process. From surgical standpoint, since patient does not has abdominal pain, there is no clinical sign of pancreatitis. If patient tolerates the soft diet without abdominal pain, may be discharged home today.   Arnold Long, MD

## 2018-09-13 NOTE — Evaluation (Addendum)
Clinical/Bedside Swallow Evaluation Patient Details  Name: Sarah White MRN: [id number] Date of Birth: 10/10/1945  Today's Date: 09/13/2018 Time: SLP Start Time (ACUTE ONLY): 1430 SLP Stop Time (ACUTE ONLY): 1530 SLP Time Calculation (min) (ACUTE ONLY): 60 min  Past Medical History:  Past Medical History:  Diagnosis Date  . Acute respiratory failure with hypoxia and hypercarbia ([place]) 07/22/2015  . Cancer ([place])    uterine  . CHF (congestive heart failure) ([place])   . COPD (chronic obstructive pulmonary disease) ([place])   . Diabetes mellitus without complication ([place])   . Encephalopathy acute 07/22/2015  . Hypertension   . Pneumonia    November 2016  . Pulmonary edema 07/22/2015  . Squamous cell cancer of skin of upper arm, right    Past Surgical History:  Past Surgical History:  Procedure Laterality Date  . ABDOMINAL HYSTERECTOMY    . CARPAL TUNNEL RELEASE Bilateral   . CESAREAN SECTION    . CHOLECYSTECTOMY N/A 09/12/2018   Procedure: LAPAROSCOPIC CHOLECYSTECTOMY WITH INTRAOPERATIVE CHOLANGIOGRAM;  Surgeon: Herbert Pun, MD;  Location: [facility];  Service: General;  Laterality: N/A;  . CORONARY ANGIOPLASTY WITH STENT PLACEMENT    . KNEE SURGERY Left   . LEFT HEART CATH AND CORONARY ANGIOGRAPHY Right 11/26/2016   Procedure: Left Heart Cath and Coronary Angiography;  Surgeon: Isaias Cowman, MD;  Location: [facility];  Service: Cardiovascular;  Laterality: Right;   HPI:  Pt is a 73 y.o. female w/ baseline of multiple medical issues including a Hiatal Hernia per pt who presents with acute onset of abdominal pain with nausea and vomiting.  Her pain is right quadrant to epigastric.  She states that it was severe and she was nauseated throughout the day.  As her pain got worse she came to ED for evaluation.  Here she is found to have a lipase of close to 3000.  She has likely gallbladder sludge or small stones on her CT scan.  Pt had a Laparoscopic Cholecystectomy  with intra operative cholangiogram d/t biliary pancreatits on 09/12/2018 - pt was orally intubated per pt report(from anesthesiologist report).  She reports difficulty swallowing pills and "bacon" d/t irritation.  Pt has been tolerating liquid and soft foods per pt and NSG reports.    Assessment / Plan / Recommendation Clinical Impression  Pt appears to present w/ an adequate oropharyngeal phase swallowing function w/ reduced risk for aspiration when following general aspiration precautions. Suggested using a puree and CRUSHING Pills into a powder form mixed in the puree for easier, less irritating swallowing d/t pt's c/o irritation post brief oral intubation during her surgery. Pt sat EOB and consumed po trials of thin liquids, purees and softened/moistened solids w/ no overt s/s of aspiration noted; no decline in vocal quality or respiratory status during/post trials. Oral phase was [facility] for bolus management, A-P transfer, and oral clearing w/ all trials. Pt fed self. OM exam appeared [facility] w/ no unilateral weakness noted. Recommend continue current Soft(GI) diet as ordered by MD; general aspiration precautions; Pills CRUSHED in puree or in liquid/chewable forms for easier swallowing during this time of irritation. Discussed foods consistencies and recommended foods less particulate and dry. Discussed moistened foods w/ condiments, using soups. No further skilled ST services indicated at this time as pt appears at her baseline w/ swallowing overall. Pt agreed w/ education and recommendations; NSG updated. NSG to reconsult ST services if needed for any further needs/education while admitted.  SLP Visit Diagnosis: Dysphagia, unspecified (R13.10)  Aspiration Risk  (reduced)    Diet Recommendation  Dysphagia level 3/regular diet - moistened foods and avoid dry or particulate foods; thin liquids. General aspiration and Reflux precautions  Medication Administration: Crushed with puree(or in chewable/liquid  forms for easier swallowing)    Other  Recommendations Recommended Consults: (n/a) Oral Care Recommendations: Oral care BID;Patient independent with oral care Other Recommendations: (n/a)   Follow up Recommendations None      Frequency and Duration (n/a)  (n/a)       Prognosis Prognosis for Safe Diet Advancement: Good Barriers to Reach Goals: (baseline Hiatal Hernia)      Swallow Study   General Date of Onset: 09/09/18 HPI: Pt is a 73 y.o. female w/ baseline of multiple medical issues including a Hiatal Hernia per pt who presents with acute onset of abdominal pain with nausea and vomiting.  Her pain is right quadrant to epigastric.  She states that it was severe and she was nauseated throughout the day.  As her pain got worse she came to ED for evaluation.  Here she is found to have a lipase of close to 3000.  She has likely gallbladder sludge or small stones on her CT scan.  Pt had a Laparoscopic Cholecystectomy with intra operative cholangiogram d/t biliary pancreatits on 09/12/2018 - pt was orally intubated per pt report(from anesthesiologist report).  She reports difficulty swallowing pills and "bacon" d/t irritation.  Pt has been tolerating liquid and soft foods per pt and NSG reports.  Type of Study: Bedside Swallow Evaluation Previous Swallow Assessment: none Diet Prior to this Study: Thin liquids(Soft diet ordered by MD) Temperature Spikes Noted: No(wbc 9.5) Respiratory Status: Nasal cannula(1-3 liters) History of Recent Intubation: Yes Length of Intubations (days): 1 days Date extubated: 09/12/18 Behavior/Cognition: Alert;Cooperative;Pleasant mood Oral Cavity Assessment: Within Functional Limits Oral Care Completed by SLP: Recent completion by staff Oral Cavity - Dentition: Dentures, top;Dentures, bottom(in place) Vision: Functional for self-feeding Self-Feeding Abilities: Able to feed self;Needs set up(min) Patient Positioning: Upright in bed(EOB) Baseline Vocal Quality:  Normal Volitional Cough: Strong Volitional Swallow: Able to elicit    Oral/Motor/Sensory Function Overall Oral Motor/Sensory Function: Within functional limits   Ice Chips Ice chips: Not tested   Thin Liquid Thin Liquid: Within functional limits Presentation: Self Fed;Cup;Straw(~3-4 ozs total)    Nectar Thick Nectar Thick Liquid: Not tested   Honey Thick Honey Thick Liquid: Not tested   Puree Puree: Within functional limits Presentation: Self Fed;Spoon(3 ozs)   Solid       Solid: Within functional limits(mech soft foods - moistened well) Presentation: Self Fed;Spoon(4 trials) Other Comments: encouraged pt to chew/masticate foods well     Orinda Kenner, MS, CCC-SLP [facility] Foots 09/13/2018,4:19 PM

## 2018-09-13 NOTE — Care Management Note (Signed)
Case Management Note  Patient Details  Name: Sarah White MRN: [id number] Date of Birth: Jul 22, 1946  Subjective/Objective:                  Met with Patient and brother to discuss DC needs Patient has son living with her, Karmah Potocki Patient has transportation Patient has Bedside commode and walker at home Patient declines [facility] services and DME at this time Provided with the [facility] list per CMS.gov Patient uses Product/process development scientist and can afford medications ok Patient has Dr. Wynetta Emery is PCP  Action/Plan: [facility] list provided to Patient per CMS.gov   Expected Discharge Date:                  Expected Discharge Plan:     In-House Referral:     Discharge planning Services     Post Acute Care Choice:    Choice offered to:     DME Arranged:    DME Agency:     HH Arranged:    HH Agency:     Status of Service:  Completed, signed off  If discussed at [employer] of Stay Meetings, dates discussed:    Additional Comments:  Su Hilt, RN 09/13/2018, 3:36 PM

## 2018-09-13 NOTE — Progress Notes (Signed)
[place] [facility] [place] NAME: Sarah White    MR#:  [id number]  DATE OF BIRTH:  [birth year]  SUBJECTIVE:  CHIEF COMPLAINT: Patient is feeling much better , diet advanced to soft but eventually had abdominal pain has been worse , could not wean off 1 L of oxygen and patient is refusing to go home with oxygen.  As per RN patient is desaturating on room air  REVIEW OF SYSTEMS:  CONSTITUTIONAL: No fever, fatigue or weakness.  EYES: No blurred or double vision.  EARS, NOSE, AND THROAT: No tinnitus or ear pain.  RESPIRATORY: No cough, shortness of breath, wheezing or hemoptysis.  CARDIOVASCULAR: No chest pain, orthopnea, edema.  GASTROINTESTINAL: Reports nausea but denies any vomiting, has no epigastric abdominal discomfort  GENITOURINARY: No dysuria, hematuria.  ENDOCRINE: No polyuria, nocturia,  HEMATOLOGY: No anemia, easy bruising or bleeding SKIN: No rash or lesion. MUSCULOSKELETAL: No joint pain or arthritis.   NEUROLOGIC: No tingling, numbness, weakness.  PSYCHIATRY: No anxiety or depression.   DRUG ALLERGIES:   Allergies  Allergen Reactions  . Amlodipine Swelling  . Codeine Nausea And Vomiting  . Hctz [Hydrochlorothiazide] Other (See Comments)    Hypokalemia  . Lisinopril Other (See Comments)    Dizziness   . Metformin And Related Nausea Only    Nausea, dizziness    VITALS:  Blood pressure (!) 123/93, pulse 68, temperature 97.7 F (36.5 C), temperature source Oral, resp. rate 18, height 5\' 5"  (1.651 m), weight 93.4 kg, SpO2 95 %.  PHYSICAL EXAMINATION:  GENERAL:  73 y.o.-year-old patient lying in the bed with no acute distress.  EYES: Pupils equal, round, reactive to light and accommodation. No scleral icterus. Extraocular muscles intact.  HEENT: Head atraumatic, normocephalic. Oropharynx and nasopharynx clear.  NECK:  Supple, no jugular venous distention. No thyroid enlargement, no tenderness.  LUNGS: Normal breath  sounds bilaterally, no wheezing, rales,rhonchi or crepitation. No use of accessory muscles of respiration.  CARDIOVASCULAR: S1, S2 normal. No murmurs, rubs, or gallops.  ABDOMEN: Soft, minimal epigastric tenderness no rebound tenderness, nondistended. Bowel sounds present.  EXTREMITIES: No pedal edema, cyanosis, or clubbing.  NEUROLOGIC: Awake, alert and oriented x3 sensation intact. Gait not checked.  PSYCHIATRIC: The patient is alert and oriented x 3.  SKIN: No obvious rash, lesion, or ulcer.    LABORATORY PANEL:   CBC Recent Labs  Lab 09/13/18 0849  WBC 9.5  HGB 14.3  HCT 44.7  PLT 166   ------------------------------------------------------------------------------------------------------------------  Chemistries  Recent Labs  Lab 09/13/18 0849  NA 136  K 3.1*  CL 99  CO2 28  GLUCOSE 165*  BUN 9  CREATININE 0.53  CALCIUM 8.5*  AST 31  ALT 33  ALKPHOS 117  BILITOT 0.7   ------------------------------------------------------------------------------------------------------------------  Cardiac Enzymes Recent Labs  Lab 09/08/18 2029  TROPONINI <0.03   ------------------------------------------------------------------------------------------------------------------  RADIOLOGY:  Dg Cholangiogram Operative  Result Date: 09/12/2018 CLINICAL DATA:  Cholelithiasis. EXAM: INTRAOPERATIVE CHOLANGIOGRAM TECHNIQUE: Cholangiographic images from the C-arm fluoroscopic device were submitted for interpretation post-operatively. Please see the procedural report for the amount of contrast and the fluoroscopy time utilized. COMPARISON:  09/08/2018 FINDINGS: Contrast fills the cystic duct and extrahepatic biliary system. Small amount of reflux into the intrahepatic bile ducts. Contrast fills the duodenum and refluxes into the main pancreatic duct. No large filling defects or stones. IMPRESSION: Patent biliary system without stones. Electronically Signed   By: Sarah White M.D.   On:  09/12/2018 14:49  EKG:   Orders placed or performed during the hospital encounter of 09/08/18  . EKG 12-Lead  . EKG 12-Lead    ASSESSMENT AND PLAN:    Acute gallstone pancreatitis - Lap cholecystectomy postop day #1  Pain management as needed  lipase levels - nml range  Seen by Dr. Ubaldo White and cardiology cleared her for surgery Okay to discharge patient from surgical standpoint once patient is clinically stable  Hypokalemia replete and recheck in a.m.  Dysphagia-speech therapy evaluation If needed outpatient GI follow-up PPI   Chronic COPD with no exacerbation-continue home inhalers and breathing treatments as needed   -  Diabetes mellitus with chronic kidney disease ([place]) -sliding scale insulin    CAD (coronary artery disease) -patient is asymptomatic continue home meds Imdur, Coreg, nitroglycerin as needed LDL 92, not on statin Echocardiogram from March 2018 with 50 to 55% ejection fraction.         All the records are reviewed and case discussed with Care Management/Social Workerr. Management plans discussed with the patient, she is  in agreement.  CODE STATUS:   TOTAL TIME TAKING CARE OF THIS PATIENT: 36 minutes.   POSSIBLE D/C IN 1 DAYS, DEPENDING ON CLINICAL CONDITION.  Note: This dictation was prepared with Dragon dictation along with smaller phrase technology. Any transcriptional errors that result from this process are unintentional.   Sarah White M.D on 09/13/2018 at 2:14 PM  Between 7am to 6pm - Pager - [phone] After 6pm go to [email or web address] - password [email or web address]  Office  [phone]  CC: Primary care physician; Sarah Roys, DO

## 2018-09-14 LAB — GLUCOSE, CAPILLARY
Glucose-Capillary: 141 mg/dL — ABNORMAL HIGH (ref 70–99)
Glucose-Capillary: 210 mg/dL — ABNORMAL HIGH (ref 70–99)

## 2018-09-14 LAB — SURGICAL PATHOLOGY

## 2018-09-14 MED ORDER — OXYCODONE-ACETAMINOPHEN 5-325 MG PO TABS: 1.0000 | ORAL_TABLET | ORAL | 0 refills | Status: DC | PRN

## 2018-09-14 NOTE — Care Management (Signed)
Patient states that she is ready to go home, se declines HH and denies the need for Oxygen.  States that she will have her son stay with her and she has no needs at this time She states that she has no DME needs that she has BSC and RW at home

## 2018-09-14 NOTE — Progress Notes (Signed)
[facility] Hospital Day(s): 5.   Post op day(s): 2 Days Post-Op.   Interval History: Patient seen and examined, no acute events or new complaints overnight. Patient reports having pain on back and abdomen but not from food and is not as the pancreatitis pain. Patient refers that pain is coming from lying down on bed and when trying to get up from bed. She refers that she tolerated the diet without worsening the abdominal pain.     Vital signs in last 24 hours: [min-max] current  Temp:  [97.7 F (36.5 C)-98 F (36.7 C)] 97.9 F (36.6 C) (01/14 2256) Pulse Rate:  [68-74] 69 (01/14 2256) Resp:  [18-19] 19 (01/14 2256) BP: (123-177)/(62-93) 145/66 (01/14 2256) SpO2:  [87 %-97 %] 91 % (01/15 0652)     Height: 5\' 5"  (165.1 cm) Weight: 93.4 kg BMI (Calculated): 34.27    Physical Exam:  Constitutional: alert, cooperative and no distress  Respiratory: breathing non-labored at rest  Cardiovascular: regular rate and sinus rhythm  Gastrointestinal: soft, non-tender, and non-distended. Wound dry and clean. Small bruise on the umbilical wound. No sign of infection.   Labs:  CBC Latest Ref Rng & Units 09/13/2018 09/09/2018 09/08/2018  WBC 4.0 - 10.5 K/uL 9.5 10.1 10.2  Hemoglobin 12.0 - 15.0 g/dL 14.3 14.3 15.3(H)  Hematocrit 36.0 - 46.0 % 44.7 45.7 48.4(H)  Platelets 150 - 400 K/uL 166 143(L) 152   CMP Latest Ref Rng & Units 09/13/2018 09/10/2018 09/09/2018  Glucose 70 - 99 mg/dL 165(H) 156(H) 275(H)  BUN 8 - 23 mg/dL 9 9 11   Creatinine 0.44 - 1.00 mg/dL 0.53 0.56 0.63  Sodium 135 - 145 mmol/L 136 137 137  Potassium 3.5 - 5.1 mmol/L 3.1(L) 3.8 4.1  Chloride 98 - 111 mmol/L 99 105 105  CO2 22 - 32 mmol/L 28 26 26   Calcium 8.9 - 10.3 mg/dL 8.5(L) 8.2(L) 8.4(L)  Total Protein 6.5 - 8.1 g/dL 6.6 6.1(L) -  Total Bilirubin 0.3 - 1.2 mg/dL 0.7 0.6 -  Alkaline Phos 38 - 126 U/L 117 124 -  AST 15 - 41 U/L 31 22 -  ALT 0 - 44 U/L 33 28 -    Imaging studies: No new pertinent  imaging studies   Assessment/Plan:  73 y.o. female with gallstone pancreatitis 2 Days Post-Op s/p lap cholecystectomy with intra op cholangiogram, complicated by pertinent comorbidities including COPD, coronary artery disease,congestive heart failure,diabetes mellitus.  Patient with multiple pain that does not look related to surgery, back pain most likely from bed and chronic pain. No leukocytosis yesterday, no elevated lipase, normal liver enzymes and bilirubin. Diet does not aggravate pain. Agree to continue pain management as a chronic pain. Agree to discharge when medically stable.   Arnold Long, MD

## 2018-09-14 NOTE — Progress Notes (Signed)
SATURATION QUALIFICATIONS: (This note is used to comply with regulatory documentation for home oxygen)  Patient Saturations on Room Air at Rest = 90%  Patient Saturations on Room Air while Ambulating = 87%  Patient Saturations on 1 Liters of oxygen while Ambulating = 94%  Please briefly explain why patient needs home oxygen:

## 2018-09-14 NOTE — Discharge Summary (Signed)
[place] [facility] [place], 73 y.o., DOB 05/19/1946, MRN [id number]. Admission date: 09/08/2018 Discharge Date 09/14/2018 Primary MD Valerie Roys, DO Admitting Physician Lance Coon, MD  Admission Diagnosis  Acute pancreatitis, unspecified complication status, unspecified pancreatitis type [K85.90]  Discharge Diagnosis   Principal Problem: Acute gallstone pancreatitis status post lap cholecystectomy Hypokalemia Dysphasia Chronic COPD Diabetes type 2 Coronary artery disease   Hospital Course  Sarah White  is a 73 y.o. female who presents with chief complaint as above.  Patient presents tonight with acute onset of abdominal pain with nausea and vomiting.  Her pain is right quadrant to epigastric.  She states that it was severe and she was nauseated throughout the day.  As her pain got worse she came to ED for evaluation.  Here she is found to have a lipase of close to 3000.  She has likely gallbladder sludge or small stones on her CT scan.  Patient was admitted and was kept n.p.o. subsequently seen by surgery and had lap cholecystectomy.  Patient now doing much better very anxious to go home. Patient did require oxygen briefly but she was able to be weaned off oxygen.           Consults  general surgery  Significant Tests:  See full reports for all details     Dg Cholangiogram Operative  Result Date: 09/12/2018 CLINICAL DATA:  Cholelithiasis. EXAM: INTRAOPERATIVE CHOLANGIOGRAM TECHNIQUE: Cholangiographic images from the C-arm fluoroscopic device were submitted for interpretation post-operatively. Please see the procedural report for the amount of contrast and the fluoroscopy time utilized. COMPARISON:  09/08/2018 FINDINGS: Contrast fills the cystic duct and extrahepatic biliary system. Small amount of reflux into the intrahepatic bile ducts. Contrast fills the duodenum and refluxes into the main pancreatic duct. No large filling defects or  stones. IMPRESSION: Patent biliary system without stones. Electronically Signed   By: Markus Daft M.D.   On: 09/12/2018 14:49   Dg Chest Portable 1 View  Result Date: 09/08/2018 CLINICAL DATA:  73 year old female with history of central chest pain which is sharp, starting 3 hours ago. No associated shortness of breath or dizziness. Vomiting and nausea for the past 3 hours. EXAM: PORTABLE CHEST 1 VIEW COMPARISON:  Chest x-ray 11/26/2016. FINDINGS: Lung volumes are normal. No consolidative airspace disease. No pleural effusions. No pneumothorax. No pulmonary nodule or mass noted. Pulmonary vasculature and the cardiomediastinal silhouette are within normal limits. Aortic atherosclerosis. IMPRESSION: 1.  No radiographic evidence of acute cardiopulmonary disease. 2. Aortic atherosclerosis. Electronically Signed   By: Vinnie Langton M.D.   On: 09/08/2018 20:24   Ct Angio Chest/abd/pel For Dissection W And/or Wo Contrast  Result Date: 09/08/2018 CLINICAL DATA:  Central chest pain for several hours EXAM: CT ANGIOGRAPHY CHEST, ABDOMEN AND PELVIS TECHNIQUE: Multidetector CT imaging through the chest, abdomen and pelvis was performed using the standard protocol during bolus administration of intravenous contrast. Multiplanar reconstructed images and MIPs were obtained and reviewed to evaluate the vascular anatomy. CONTRAST:  130mL OMNIPAQUE IOHEXOL 350 MG/ML SOLN COMPARISON:  08/19/2017 FINDINGS: CTA CHEST FINDINGS Cardiovascular: Precontrast images demonstrate no hyperdense crescent within the thoracic aorta. Thoracic aorta demonstrates atherosclerotic calcifications without aneurysmal dilatation. Mild coronary calcifications are seen. No cardiac enlargement is noted. Mildly ulcerated plaque is noted within the descending thoracic aorta. No dissection is identified. The pulmonary artery as visualized shows no large central embolus. Mediastinum/Nodes: Thoracic inlet is within normal limits. No hilar or mediastinal  adenopathy is noted.  The esophagus is within normal limits. Lungs/Pleura: Lungs are well aerated bilaterally with diffuse emphysematous changes. No focal infiltrate or sizable effusion is seen. No sizable parenchymal nodule is noted. Musculoskeletal: Degenerative changes of the thoracic spine are noted. Old rib fractures are noted on the left. Review of the MIP images confirms the above findings. CTA ABDOMEN AND PELVIS FINDINGS VASCULAR Aorta: Abdominal aorta demonstrates calcified and noncalcified atherosclerotic plaque throughout its course. Focal intimal flap is noted in the infrarenal aorta consistent with chronic dissection. Ulcerative plaque is noted as well. No occlusive changes are seen. No aneurysmal dilatation is seen. Celiac: Celiac axis demonstrates both calcified and noncalcified plaque at its origin with focal stenosis of approximately 50%. SMA: Patent without evidence of aneurysm, dissection, vasculitis or significant stenosis. Renals: Mild atherosclerotic changes are noted bilaterally. Early bifurcation is noted on the right. Single renal arteries are noted bilaterally. IMA: Patent without evidence of aneurysm, dissection, vasculitis or significant stenosis. Iliacs: Atherosclerotic changes are noted without focal stenosis. Veins: No venous abnormality is noted. Review of the MIP images confirms the above findings. NON-VASCULAR Hepatobiliary: Liver is well visualized within normal limits. The gallbladder is well distended. Dependent density is noted likely related to small stones or gallbladder sludge. This is stable from the prior exam. Pancreas: Pancreas is well visualized and demonstrates mild peripancreatic inflammatory change consistent with mild acute pancreatitis. No pseudocyst is seen. No changes to suggest pancreatic necrosis are noted at this time. Spleen: Within normal limits. Adrenals/Urinary Tract: Adrenal glands are unremarkable. Kidneys are well visualized bilaterally within normal  enhancement pattern. Some scarring is noted in the left kidney in the lower pole stable from the prior exam. Previously seen left UPJ stone is no longer identified. The bladder is well distended. Stomach/Bowel: Colon is decompressed. The appendix is well visualized and within normal limits. No small bowel or gastric abnormality is seen. Lymphatic: No significant lymphadenopathy is noted. Reproductive: Uterus has been surgically removed. No adnexal mass is noted. Other: No abdominal wall hernia or abnormality. No abdominopelvic ascites. Musculoskeletal: Degenerative changes of lumbar spine are seen. Chronic superior endplate L5 compression deformity is noted. Review of the MIP images confirms the above findings. IMPRESSION: CTA of the chest: No evidence of dissection is identified. No aneurysmal dilatation is seen. Atherosclerotic plaque with mild ulcerations are noted particularly in the descending thoracic aorta. Emphysematous changes without acute parenchymal abnormality. CTA of the abdomen and pelvis: Changes consistent with mild acute pancreatitis. No pseudocyst is identified. Significant calcified and noncalcified atherosclerotic plaque within the abdominal aorta. No aneurysmal dilatation is seen. 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 Dependent density within the gallbladder consistent with small stones or gallbladder sludge. Previously seen left UPJ stone is no longer identified. Electronically Signed   By: Inez Catalina M.D.   On: 09/08/2018 21:20   US Abdomen Limited Ruq  Result Date: 09/08/2018 CLINICAL DATA:  Right upper quadrant pain EXAM: ULTRASOUND ABDOMEN LIMITED RIGHT UPPER QUADRANT COMPARISON:  CT 08/19/2017 FINDINGS: Gallbladder: Shadowing stones in the gallbladder measuring up to 4 mm. Negative sonographic Murphy. Normal wall thickness Common bile duct: Diameter: 2.5 mm Liver: No focal lesion identified. Within normal limits in parenchymal echogenicity. Portal vein is patent on color Doppler  imaging with normal direction of blood flow towards the liver. Small amount of fluid adjacent to the gallbladder. IMPRESSION: Layering gallstones with possible trace fluid adjacent to the gallbladder. Negative for sonographic Murphy or wall thickening. If acute cholecystitis remains a concern, correlation with nuclear medicine hepatobiliary  imaging would be helpful for further assessment. Electronically Signed   By: Donavan Foil M.D.   On: 09/08/2018 23:31       Today   Subjective:   Sarah White feeling much better denies any complaint Objective:   Blood pressure (!) 157/66, pulse 73, temperature 98.2 F (36.8 C), temperature source Oral, resp. rate 19, height 5\' 5"  (1.651 m), weight 93.4 kg, SpO2 90 %.  .  Intake/Output Summary (Last 24 hours) at 09/14/2018 1731 Last data filed at 09/14/2018 0900 Gross per 24 hour  Intake 1158.07 ml  Output -  Net 1158.07 ml    Exam VITAL SIGNS: Blood pressure (!) 157/66, pulse 73, temperature 98.2 F (36.8 C), temperature source Oral, resp. rate 19, height 5\' 5"  (1.651 m), weight 93.4 kg, SpO2 90 %.  GENERAL:  73 y.o.-year-old patient lying in the bed with no acute distress.  EYES: Pupils equal, round, reactive to light and accommodation. No scleral icterus. Extraocular muscles intact.  HEENT: Head atraumatic, normocephalic. Oropharynx and nasopharynx clear.  NECK:  Supple, no jugular venous distention. No thyroid enlargement, no tenderness.  LUNGS: Normal breath sounds bilaterally, no wheezing, rales,rhonchi or crepitation. No use of accessory muscles of respiration.  CARDIOVASCULAR: S1, S2 normal. No murmurs, rubs, or gallops.  ABDOMEN: Soft, nontender, nondistended. Bowel sounds present. No organomegaly or mass.  EXTREMITIES: No pedal edema, cyanosis, or clubbing.  NEUROLOGIC: Cranial nerves II through XII are intact. Muscle strength 5/5 in all extremities. Sensation intact. Gait not checked.  PSYCHIATRIC: The patient is alert and oriented x  3.  SKIN: No obvious rash, lesion, or ulcer.   Data Review     CBC w Diff:  Lab Results  Component Value Date   WBC 9.5 09/13/2018   HGB 14.3 09/13/2018   HGB 15.5 03/24/2018   HCT 44.7 09/13/2018   HCT 48.8 (H) 03/24/2018   PLT 166 09/13/2018   PLT 147 (L) 03/24/2018   LYMPHOPCT 4 07/22/2015   LYMPHOPCT 8.2 01/16/2014   MONOPCT 2 07/22/2015   MONOPCT 1.0 01/16/2014   EOSPCT 0 07/22/2015   EOSPCT 0.0 01/16/2014   BASOPCT 1 07/22/2015   BASOPCT 0.3 01/16/2014   CMP:  Lab Results  Component Value Date   NA 136 09/13/2018   NA 140 05/05/2018   NA 134 (L) 01/15/2014   K 3.1 (L) 09/13/2018   K 4.2 01/15/2014   CL 99 09/13/2018   CL 102 01/15/2014   CO2 28 09/13/2018   CO2 26 01/15/2014   BUN 9 09/13/2018   BUN 9 05/05/2018   BUN 11 01/15/2014   CREATININE 0.53 09/13/2018   CREATININE 0.52 (L) 01/15/2014   PROT 6.6 09/13/2018   PROT 6.1 05/05/2018   PROT 7.3 01/15/2014   ALBUMIN 3.4 (L) 09/13/2018   ALBUMIN 4.2 05/05/2018   ALBUMIN 3.7 01/15/2014   BILITOT 0.7 09/13/2018   BILITOT 0.2 05/05/2018   BILITOT 0.4 01/15/2014   ALKPHOS 117 09/13/2018   ALKPHOS 125 (H) 01/15/2014   AST 31 09/13/2018   AST 27 01/15/2014   ALT 33 09/13/2018   ALT 28 01/15/2014  .  Micro Results Recent Results (from the past 240 hour(s))  Surgical PCR screen     Status: Abnormal   Collection Time: 09/11/18 11:10 PM  Result Value Ref Range Status   MRSA, PCR POSITIVE (A) NEGATIVE Final    Comment: RESULT CALLED TO, READ BACK BY AND VERIFIED WITH: MARY RAYMOND @0110  09/12/18 AKT    Staphylococcus aureus POSITIVE (A)  NEGATIVE Final    Comment: (NOTE) The Xpert SA Assay (FDA approved for NASAL specimens in patients 64 years of age and older), is one component of a comprehensive surveillance program. It is not intended to diagnose infection nor to guide or monitor treatment. Performed at [facility], [address], [place], [place] [address]         Code Status  Orders  (From admission, onward)         Start     Ordered   09/09/18 2951  Full code  Continuous     09/09/18 0637        Code Status History    Date Active Date Inactive Code Status Order ID Comments User Context   11/26/2016 0402 11/27/2016 1810 DNR [id number]  Harvie Bridge, DO Inpatient   03/11/2016 1856 03/12/2016 1303 DNR [id number]  Demetrios Loll, MD Inpatient   07/22/2015 1242 07/25/2015 2149 Partial Code [id number]  Wilhelmina Mcardle, MD Inpatient   07/17/2015 2119 07/22/2015 1242 DNR [id number]  Nicholes Mango, MD Inpatient          Follow-up Information    Valerie Roys, DO On 09/20/2018.   Specialty:  Family Medicine Why:  @ 2:30 pm Contact information: [place] [address] [phone]        Herbert Pun, MD On 09/29/2018.   Specialty:  General Surgery Why:  hosp f/u;  @ 9:30 am Contact information: [place] [address] [phone]           Discharge Medications   Allergies as of 09/14/2018      Reactions   Amlodipine Swelling   Codeine Nausea And Vomiting   Hctz [hydrochlorothiazide] Other (See Comments)   Hypokalemia   Lisinopril Other (See Comments)   Dizziness   Metformin And Related Nausea Only   Nausea, dizziness      Medication List    STOP taking these medications   ibuprofen 800 MG tablet Commonly known as:  ADVIL,MOTRIN   meclizine 12.5 MG tablet Commonly known as:  ANTIVERT     TAKE these medications   aspirin EC 81 MG tablet Take 81 mg by mouth at bedtime.   atorvastatin 40 MG tablet Commonly known as:  LIPITOR Take 1 tablet (40 mg total) by mouth daily.   carvedilol 12.5 MG tablet Commonly known as:  COREG TAKE TWO TABLETS BY MOUTH TWICE DAILY WITH A MEAL   diphenhydrAMINE 25 MG tablet Commonly known as:  BENADRYL Take 25 mg by mouth every 6 (six) hours as needed for allergies.   docusate sodium 100 MG capsule Commonly known as:  COLACE Take 100 mg by mouth daily.    Dulaglutide 1.5 MG/0.5ML Sopn Commonly known as:  TRULICITY Inject 1.5 mg into the skin once a week.   furosemide 40 MG tablet Commonly known as:  LASIX Take 1 tablet (40 mg total) by mouth daily.   isosorbide mononitrate 60 MG 24 hr tablet Commonly known as:  IMDUR Take 1 tablet (60 mg total) by mouth daily.   nitroGLYCERIN 0.4 MG SL tablet Commonly known as:  NITROSTAT Place 1 tablet (0.4 mg total) under the tongue every 5 (five) minutes as needed for chest pain.   ondansetron 4 MG disintegrating tablet Commonly known as:  ZOFRAN-ODT Take 4 mg by mouth every 8 (eight) hours as needed for nausea or vomiting.   oxyCODONE-acetaminophen 5-325 MG tablet Commonly known as:  PERCOCET Take 1 tablet by mouth every 4 (  four) hours as needed for severe pain.   PROAIR HFA 108 (90 Base) MCG/ACT inhaler Generic drug:  albuterol INHALE 1 TO 2 PUFFS BY MOUTH EVERY 4 HOURS AS NEEDED FOR WHEEZING OR SHORTNESS OF BREATH   sertraline 100 MG tablet Commonly known as:  ZOLOFT Take one pill daily   tiotropium 18 MCG inhalation capsule Commonly known as:  SPIRIVA HANDIHALER Place 1 capsule (18 mcg total) into inhaler and inhale daily.   traZODone 50 MG tablet Commonly known as:  DESYREL TAKE ONE-HALF TO ONE TABLET BY MOUTH AT BEDTIME AS NEEDED FOR SLEEP          Total Time in preparing paper work, data evaluation and todays exam - 47 minutes  Dustin Flock M.D on 09/14/2018 at 5:31 PM [employer]   Office  [phone]

## 2018-09-14 NOTE — Progress Notes (Signed)
pts room air sat while resting is 90, pts room air sat while ambulating is 89-90.

## 2018-09-14 NOTE — Care Management Important Message (Signed)
Important Message  Patient Details  Name: Sarah White MRN: [id number] Date of Birth: [birth year]   Medicare Important Message Given:  Yes    Su Hilt, RN 09/14/2018, 12:39 PM

## 2018-09-15 ENCOUNTER — Telehealth: Payer: Self-pay

## 2018-09-15 NOTE — Telephone Encounter (Signed)
Transition Care Management Follow-up Telephone Call  Date of discharge and from where: [facility] on 09/14/18.  How have you been since you were released from the hospital? Doing a lot better but still sore in her back and side. Mostly occurs with movement. Pain level is about a 4-5 currently.Taking 1/2 of a Percocet when pain level is high.   Any questions or concerns? No   Items Reviewed:  Did the pt receive and understand the discharge instructions provided? Yes   Medications obtained and verified? Declined at this time.  Any new allergies since your discharge? No  Dietary orders reviewed? Yes  Do you have support at home? Yes   Other (ie: DME, Home Health, etc) N/A  Functional Questionnaire: (I = Independent and D = Dependent)  Bathing/Dressing- I   Meal Prep- I  Eating- I  Maintaining continence- I  Transferring/Ambulation- Using a walker for assistance.  Managing Meds- I   Follow up appointments reviewed:    PCP Hospital f/u appt confirmed? Yes  Scheduled to see Park Liter on 09/20/18 @ 2:30 PM.  [facility] f/u appt confirmed? Yes   Are transportation arrangements needed? No   If their condition worsens, is the pt aware to call  their PCP or go to the ED? Yes  Was the patient provided with contact information for the PCP's office or ED? Yes  Was the pt encouraged to call back with questions or concerns? Yes

## 2018-09-15 NOTE — Telephone Encounter (Signed)
I have made the 1st attempt to contact the patient or family member in charge, in order to follow up from recently being discharged from the hospital. I left a message on voicemail requesting a CB. -MM 

## 2018-09-20 ENCOUNTER — Encounter: Payer: Self-pay | Admitting: Family Medicine

## 2018-09-20 ENCOUNTER — Ambulatory Visit (INDEPENDENT_AMBULATORY_CARE_PROVIDER_SITE_OTHER): Payer: Medicare Other | Admitting: Family Medicine

## 2018-09-20 VITALS — BP 153/89 | HR 87 | Temp 97.8°F | Wt 188.8 lb

## 2018-09-20 DIAGNOSIS — K851 Biliary acute pancreatitis without necrosis or infection: Secondary | ICD-10-CM

## 2018-09-20 DIAGNOSIS — E876 Hypokalemia: Secondary | ICD-10-CM | POA: Diagnosis not present

## 2018-09-20 DIAGNOSIS — R21 Rash and other nonspecific skin eruption: Secondary | ICD-10-CM

## 2018-09-20 MED ORDER — MUPIROCIN 2 % EX OINT: 1.0000 "application " | TOPICAL_OINTMENT | Freq: Two times a day (BID) | CUTANEOUS | 0 refills | Status: DC

## 2018-09-20 MED ORDER — TRIAMCINOLONE ACETONIDE 0.5 % EX OINT: 1.0000 "application " | TOPICAL_OINTMENT | Freq: Two times a day (BID) | CUTANEOUS | 0 refills | Status: DC

## 2018-09-20 NOTE — Assessment & Plan Note (Signed)
Rechecking levels today. Await results.  

## 2018-09-20 NOTE — Progress Notes (Signed)
BP (!) 153/89 (BP Location: Left Arm, Cuff Size: Normal)   Pulse 87   Temp 97.8 F (36.6 C) (Oral)   Wt 188 lb 12.8 oz (85.6 kg)   SpO2 96%   BMI 31.42 kg/m    Subjective:    [name], female    DOB: 1946-06-08, 73 y.o.   MRN: [id number]  HPI: Sarah White is a 73 y.o. female  Chief Complaint  Patient presents with  . Hospitalization Follow-up    pt states she had her gallbladder removed in the hospital and had pancreatitis   Transition of [facility] Follow up.   Hospital/Facility: [facility] D/C Physician: Dr. Posey Pronto D/C Date: 09/14/18  Records Requested: 09/20/18 Records Received: 09/20/18 Records Reviewed: 09/20/18  Diagnoses on Discharge:  Acute gallstone pancreatitis status post lap cholecystectomy Hypokalemia Dysphasia Chronic COPD Diabetes type 2 Coronary artery disease  Date of interactive Contact within 48 hours of discharge: 09/15/18 Contact was through: phone  Date of 7 day or 14 day face-to-face visit: 09/20/18   within 7 days  Outpatient Encounter Medications as of 09/20/2018  Medication Sig  . aspirin EC 81 MG tablet Take 81 mg by mouth at bedtime.   Marland Kitchen atorvastatin (LIPITOR) 40 MG tablet Take 1 tablet (40 mg total) by mouth daily.  . carvedilol (COREG) 12.5 MG tablet TAKE TWO TABLETS BY MOUTH TWICE DAILY WITH A MEAL  . diphenhydrAMINE (BENADRYL) 25 MG tablet Take 25 mg by mouth every 6 (six) hours as needed for allergies.  . Dulaglutide (TRULICITY) 1.5 HW/2.9HB SOPN Inject 1.5 mg into the skin once a week.  . furosemide (LASIX) 40 MG tablet Take 1 tablet (40 mg total) by mouth daily.  . isosorbide mononitrate (IMDUR) 60 MG 24 hr tablet Take 1 tablet (60 mg total) by mouth daily.  . nitroGLYCERIN (NITROSTAT) 0.4 MG SL tablet Place 1 tablet (0.4 mg total) under the tongue every 5 (five) minutes as needed for chest pain.  Marland Kitchen ondansetron (ZOFRAN-ODT) 4 MG disintegrating tablet Take 4 mg by mouth every 8 (eight) hours as needed for nausea or  vomiting.  Marland Kitchen oxyCODONE-acetaminophen (PERCOCET) 5-325 MG tablet Take 1 tablet by mouth every 4 (four) hours as needed for severe pain.  Marland Kitchen PROAIR HFA 108 (90 Base) MCG/ACT inhaler INHALE 1 TO 2 PUFFS BY MOUTH EVERY 4 HOURS AS NEEDED FOR WHEEZING OR SHORTNESS OF BREATH  . sertraline (ZOLOFT) 100 MG tablet Take one pill daily  . tiotropium (SPIRIVA HANDIHALER) 18 MCG inhalation capsule Place 1 capsule (18 mcg total) into inhaler and inhale daily.  . traZODone (DESYREL) 50 MG tablet TAKE ONE-HALF TO ONE TABLET BY MOUTH AT BEDTIME AS NEEDED FOR SLEEP  . [DISCONTINUED] docusate sodium (COLACE) 100 MG capsule Take 100 mg by mouth daily.   . mupirocin ointment (BACTROBAN) 2 % Place 1 application into the nose 2 (two) times daily.  Marland Kitchen triamcinolone ointment (KENALOG) 0.5 % Apply 1 application topically 2 (two) times daily.   No facility-administered encounter medications on file as of 09/20/2018.    Per hospitalist: [facility] Course  Sarah White a72 y.o.femalewho presents with chief complaint as above. Patient presents tonight with acute onset of abdominal pain with nausea and vomiting. Her pain is right quadrant to epigastric. She states that it was severe and she was nauseated throughout the day. As her pain got worse she came to ED for evaluation. Here she is found to have a lipase of close to 3000. She has likely gallbladder sludge or  small stones on her CT scan.  Patient was admitted and was kept n.p.o. subsequently seen by surgery and had lap cholecystectomy.  Patient now doing much better very anxious to go home. Patient did require oxygen briefly but she was able to be weaned off oxygen."  Diagnostic Tests Reviewed:  CLINICAL DATA:  73 year old female with history of central chest pain which is sharp, starting 3 hours ago. No associated shortness of breath or dizziness. Vomiting and nausea for the past 3 hours.  EXAM: PORTABLE CHEST 1 VIEW  COMPARISON:  Chest x-ray  11/26/2016.  FINDINGS: Lung volumes are normal. No consolidative airspace disease. No pleural effusions. No pneumothorax. No pulmonary nodule or mass noted. Pulmonary vasculature and the cardiomediastinal silhouette are within normal limits. Aortic atherosclerosis.  IMPRESSION: 1.  No radiographic evidence of acute cardiopulmonary disease. 2. Aortic atherosclerosis.  CLINICAL DATA:  Central chest pain for several hours  EXAM: CT ANGIOGRAPHY CHEST, ABDOMEN AND PELVIS  TECHNIQUE: Multidetector CT imaging through the chest, abdomen and pelvis was performed using the standard protocol during bolus administration of intravenous contrast. Multiplanar reconstructed images and MIPs were obtained and reviewed to evaluate the vascular anatomy.  CONTRAST:  18mL OMNIPAQUE IOHEXOL 350 MG/ML SOLN  COMPARISON:  08/19/2017  FINDINGS: CTA CHEST FINDINGS  Cardiovascular: Precontrast images demonstrate no hyperdense crescent within the thoracic aorta. Thoracic aorta demonstrates atherosclerotic calcifications without aneurysmal dilatation. Mild coronary calcifications are seen. No cardiac enlargement is noted. Mildly ulcerated plaque is noted within the descending thoracic aorta. No dissection is identified. The pulmonary artery as visualized shows no large central embolus.  Mediastinum/Nodes: Thoracic inlet is within normal limits. No hilar or mediastinal adenopathy is noted. The esophagus is within normal limits.  Lungs/Pleura: Lungs are well aerated bilaterally with diffuse emphysematous changes. No focal infiltrate or sizable effusion is seen. No sizable parenchymal nodule is noted.  Musculoskeletal: Degenerative changes of the thoracic spine are noted. Old rib fractures are noted on the left.  Review of the MIP images confirms the above findings.  CTA ABDOMEN AND PELVIS FINDINGS  VASCULAR  Aorta: Abdominal aorta demonstrates calcified and  noncalcified atherosclerotic plaque throughout its course. Focal intimal flap is noted in the infrarenal aorta consistent with chronic dissection. Ulcerative plaque is noted as well. No occlusive changes are seen. No aneurysmal dilatation is seen.  Celiac: Celiac axis demonstrates both calcified and noncalcified plaque at its origin with focal stenosis of approximately 50%.  SMA: Patent without evidence of aneurysm, dissection, vasculitis or significant stenosis.  Renals: Mild atherosclerotic changes are noted bilaterally. Early bifurcation is noted on the right. Single renal arteries are noted bilaterally.  IMA: Patent without evidence of aneurysm, dissection, vasculitis or significant stenosis.  Iliacs: Atherosclerotic changes are noted without focal stenosis.  Veins: No venous abnormality is noted.  Review of the MIP images confirms the above findings.  NON-VASCULAR  Hepatobiliary: Liver is well visualized within normal limits. The gallbladder is well distended. Dependent density is noted likely related to small stones or gallbladder sludge. This is stable from the prior exam.  Pancreas: Pancreas is well visualized and demonstrates mild peripancreatic inflammatory change consistent with mild acute pancreatitis. No pseudocyst is seen. No changes to suggest pancreatic necrosis are noted at this time.  Spleen: Within normal limits.  Adrenals/Urinary Tract: Adrenal glands are unremarkable. Kidneys are well visualized bilaterally within normal enhancement pattern. Some scarring is noted in the left kidney in the lower pole stable from the prior exam. Previously seen left UPJ stone is no longer  identified. The bladder is well distended.  Stomach/Bowel: Colon is decompressed. The appendix is well visualized and within normal limits. No small bowel or gastric abnormality is seen.  Lymphatic: No significant lymphadenopathy is noted.  Reproductive: Uterus  has been surgically removed. No adnexal mass is noted.  Other: No abdominal wall hernia or abnormality. No abdominopelvic ascites.  Musculoskeletal: Degenerative changes of lumbar spine are seen. Chronic superior endplate L5 compression deformity is noted.  Review of the MIP images confirms the above findings.  IMPRESSION: CTA of the chest: No evidence of dissection is identified. No aneurysmal dilatation is seen.  Atherosclerotic plaque with mild ulcerations are noted particularly in the descending thoracic aorta.  Emphysematous changes without acute parenchymal abnormality.  CTA of the abdomen and pelvis: Changes consistent with mild acute pancreatitis. No pseudocyst is identified.  Significant calcified and noncalcified atherosclerotic plaque within the abdominal aorta. No aneurysmal dilatation is seen.  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  Dependent density within the gallbladder consistent with small stones or gallbladder sludge.  Previously seen left UPJ stone is no longer identified.  CLINICAL DATA:  Right upper quadrant pain  EXAM: ULTRASOUND ABDOMEN LIMITED RIGHT UPPER QUADRANT  COMPARISON:  CT 08/19/2017  FINDINGS: Gallbladder:  Shadowing stones in the gallbladder measuring up to 4 mm. Negative sonographic Murphy. Normal wall thickness  Common bile duct:  Diameter: 2.5 mm  Liver:  No focal lesion identified. Within normal limits in parenchymal echogenicity. Portal vein is patent on color Doppler imaging with normal direction of blood flow towards the liver. Small amount of fluid adjacent to the gallbladder.  IMPRESSION: Layering gallstones with possible trace fluid adjacent to the gallbladder. Negative for sonographic Murphy or wall thickening. If acute cholecystitis remains a concern, correlation with nuclear medicine hepatobiliary imaging would be helpful for further Assessment.  CLINICAL DATA:   Cholelithiasis.  EXAM: INTRAOPERATIVE CHOLANGIOGRAM  TECHNIQUE: Cholangiographic images from the C-arm fluoroscopic device were submitted for interpretation post-operatively. Please see the procedural report for the amount of contrast and the fluoroscopy time utilized.  COMPARISON:  09/08/2018  FINDINGS: Contrast fills the cystic duct and extrahepatic biliary system. Small amount of reflux into the intrahepatic bile ducts. Contrast fills the duodenum and refluxes into the main pancreatic duct. No large filling defects or stones.  IMPRESSION: Patent biliary system without stones.  Disposition: Home  Consults: General surgery  Discharge Instructions: Follow up here and with general surgery.  Disease/illness Education: Given today in writing  Home Health/Community Services Discussions/Referrals: N/A  Establishment or re-establishment of referral orders for community resources: N/A  Discussion with other health care providers: N/A  Assessment and Support of treatment regimen adherence:  Good  Appointments Coordinated with: Patient   Education for self-management, independent living, and ADLs: N/A  Has been feeling much better since getting out the hospital. No pain any more. Still feeling a little sore. No more throwing up. No fevers. She has noticed a rash on her belly in the areas where she was cleaned. Has been having itchy sores on her belly. No other concerns or complaints at this time.   Relevant past medical, surgical, family and social history reviewed and updated as indicated. Interim medical history since our last visit reviewed. Allergies and medications reviewed and updated.  Review of Systems  Constitutional: Negative.   Respiratory: Negative.   Cardiovascular: Negative.   Gastrointestinal: Negative.   Skin: Positive for rash. Negative for color change, pallor and wound.  Neurological: Negative.   Psychiatric/Behavioral: Negative.  Per HPI  unless specifically indicated above     Objective:    BP (!) 153/89 (BP Location: Left Arm, Cuff Size: Normal)   Pulse 87   Temp 97.8 F (36.6 C) (Oral)   Wt 188 lb 12.8 oz (85.6 kg)   SpO2 96%   BMI 31.42 kg/m   Wt Readings from Last 3 Encounters:  09/20/18 188 lb 12.8 oz (85.6 kg)  09/09/18 205 lb 14.6 oz (93.4 kg)  05/05/18 206 lb (93.4 kg)    Physical Exam Vitals signs and nursing note reviewed.  Constitutional:      General: She is not in acute distress.    Appearance: Normal appearance. She is not ill-appearing, toxic-appearing or diaphoretic.  HENT:     Head: Normocephalic and atraumatic.     Right Ear: External ear normal.     Left Ear: External ear normal.     Nose: Nose normal.     Mouth/Throat:     Mouth: Mucous membranes are moist.     Pharynx: Oropharynx is clear.  Eyes:     General: No scleral icterus.       Right eye: No discharge.        Left eye: No discharge.     Extraocular Movements: Extraocular movements intact.     Conjunctiva/sclera: Conjunctivae normal.     Pupils: Pupils are equal, round, and reactive to light.  Neck:     Musculoskeletal: Normal range of motion and neck supple.  Cardiovascular:     Rate and Rhythm: Normal rate and regular rhythm.     Pulses: Normal pulses.     Heart sounds: Normal heart sounds. No murmur. No friction rub. No gallop.   Pulmonary:     Effort: Pulmonary effort is normal. No respiratory distress.     Breath sounds: Normal breath sounds. No stridor. No wheezing, rhonchi or rales.  Chest:     Chest wall: No tenderness.  Abdominal:     General: Abdomen is flat. Bowel sounds are normal. There is no distension.     Palpations: Abdomen is soft. There is no mass.     Tenderness: There is no abdominal tenderness. There is no right CVA tenderness, left CVA tenderness, guarding or rebound.     Hernia: No hernia is present.  Musculoskeletal: Normal range of motion.  Skin:    General: Skin is warm and dry.      Capillary Refill: Capillary refill takes less than 2 seconds.     Coloration: Skin is not jaundiced or pale.     Findings: Rash (small scattered erythematous sores on abdomen) present. No bruising, erythema or lesion.  Neurological:     General: No focal deficit present.     Mental Status: She is alert and oriented to person, place, and time. Mental status is at baseline.  Psychiatric:        Mood and Affect: Mood normal.        Behavior: Behavior normal.        Thought Content: Thought content normal.        Judgment: Judgment normal.     Results for orders placed or performed during the hospital encounter of 09/08/18  Surgical PCR screen  Result Value Ref Range   MRSA, PCR POSITIVE (A) NEGATIVE   Staphylococcus aureus POSITIVE (A) NEGATIVE  Basic metabolic panel  Result Value Ref Range   Sodium 134 (L) 135 - 145 mmol/L   Potassium 4.1 3.5 - 5.1 mmol/L   Chloride 102  98 - 111 mmol/L   CO2 24 22 - 32 mmol/L   Glucose, Bld 341 (H) 70 - 99 mg/dL   BUN 11 8 - 23 mg/dL   Creatinine, Ser 0.70 0.44 - 1.00 mg/dL   Calcium 8.8 (L) 8.9 - 10.3 mg/dL   GFR calc non Af Amer >60 >60 mL/min   GFR calc Af Amer >60 >60 mL/min   Anion gap 8 5 - 15  CBC  Result Value Ref Range   WBC 10.2 4.0 - 10.5 K/uL   RBC 5.68 (H) 3.87 - 5.11 MIL/uL   Hemoglobin 15.3 (H) 12.0 - 15.0 g/dL   HCT 48.4 (H) 36.0 - 46.0 %   MCV 85.2 80.0 - 100.0 fL   MCH 26.9 26.0 - 34.0 pg   MCHC 31.6 30.0 - 36.0 g/dL   RDW 14.4 11.5 - 15.5 %   Platelets 152 150 - 400 K/uL   nRBC 0.0 0.0 - 0.2 %  Troponin I - ONCE - STAT  Result Value Ref Range   Troponin I <0.03 <0.03 ng/mL  Lipase, blood  Result Value Ref Range   Lipase 2,993 (H) 11 - 51 U/L  Hepatic function panel  Result Value Ref Range   Total Protein 6.8 6.5 - 8.1 g/dL   Albumin 3.8 3.5 - 5.0 g/dL   AST 86 (H) 15 - 41 U/L   ALT 48 (H) 0 - 44 U/L   Alkaline Phosphatase 170 (H) 38 - 126 U/L   Total Bilirubin 0.9 0.3 - 1.2 mg/dL   Bilirubin, Direct 0.3 (H)  0.0 - 0.2 mg/dL   Indirect Bilirubin 0.6 0.3 - 0.9 mg/dL  HIV antibody (Routine Testing)  Result Value Ref Range   HIV Screen 4th Generation wRfx Non Reactive Non Reactive  CBC  Result Value Ref Range   WBC 10.1 4.0 - 10.5 K/uL   RBC 5.30 (H) 3.87 - 5.11 MIL/uL   Hemoglobin 14.3 12.0 - 15.0 g/dL   HCT 45.7 36.0 - 46.0 %   MCV 86.2 80.0 - 100.0 fL   MCH 27.0 26.0 - 34.0 pg   MCHC 31.3 30.0 - 36.0 g/dL   RDW 14.4 11.5 - 15.5 %   Platelets 143 (L) 150 - 400 K/uL   nRBC 0.0 0.0 - 0.2 %  Basic metabolic panel  Result Value Ref Range   Sodium 137 135 - 145 mmol/L   Potassium 4.1 3.5 - 5.1 mmol/L   Chloride 105 98 - 111 mmol/L   CO2 26 22 - 32 mmol/L   Glucose, Bld 275 (H) 70 - 99 mg/dL   BUN 11 8 - 23 mg/dL   Creatinine, Ser 0.63 0.44 - 1.00 mg/dL   Calcium 8.4 (L) 8.9 - 10.3 mg/dL   GFR calc non Af Amer >60 >60 mL/min   GFR calc Af Amer >60 >60 mL/min   Anion gap 6 5 - 15  Lipase, blood  Result Value Ref Range   Lipase 523 (H) 11 - 51 U/L  Glucose, capillary  Result Value Ref Range   Glucose-Capillary 252 (H) 70 - 99 mg/dL  Glucose, capillary  Result Value Ref Range   Glucose-Capillary 162 (H) 70 - 99 mg/dL  Glucose, capillary  Result Value Ref Range   Glucose-Capillary 189 (H) 70 - 99 mg/dL   Comment 1 Notify RN   Glucose, capillary  Result Value Ref Range   Glucose-Capillary 126 (H) 70 - 99 mg/dL   Comment 1 Notify RN   Glucose, capillary  Result Value Ref Range   Glucose-Capillary 139 (H) 70 - 99 mg/dL   Comment 1 Notify RN   Comprehensive metabolic panel  Result Value Ref Range   Sodium 137 135 - 145 mmol/L   Potassium 3.8 3.5 - 5.1 mmol/L   Chloride 105 98 - 111 mmol/L   CO2 26 22 - 32 mmol/L   Glucose, Bld 156 (H) 70 - 99 mg/dL   BUN 9 8 - 23 mg/dL   Creatinine, Ser 0.56 0.44 - 1.00 mg/dL   Calcium 8.2 (L) 8.9 - 10.3 mg/dL   Total Protein 6.1 (L) 6.5 - 8.1 g/dL   Albumin 3.3 (L) 3.5 - 5.0 g/dL   AST 22 15 - 41 U/L   ALT 28 0 - 44 U/L   Alkaline  Phosphatase 124 38 - 126 U/L   Total Bilirubin 0.6 0.3 - 1.2 mg/dL   GFR calc non Af Amer >60 >60 mL/min   GFR calc Af Amer >60 >60 mL/min   Anion gap 6 5 - 15  Glucose, capillary  Result Value Ref Range   Glucose-Capillary 149 (H) 70 - 99 mg/dL   Comment 1 Notify RN   Lipase, blood  Result Value Ref Range   Lipase 44 11 - 51 U/L  Lipase, blood  Result Value Ref Range   Lipase 24 11 - 51 U/L  Glucose, capillary  Result Value Ref Range   Glucose-Capillary 136 (H) 70 - 99 mg/dL   Comment 1 Notify RN   Glucose, capillary  Result Value Ref Range   Glucose-Capillary 121 (H) 70 - 99 mg/dL  Glucose, capillary  Result Value Ref Range   Glucose-Capillary 92 70 - 99 mg/dL  Glucose, capillary  Result Value Ref Range   Glucose-Capillary 148 (H) 70 - 99 mg/dL   Comment 1 Notify RN   Lipase, blood  Result Value Ref Range   Lipase 24 11 - 51 U/L  Glucose, capillary  Result Value Ref Range   Glucose-Capillary 148 (H) 70 - 99 mg/dL   Comment 1 Notify RN   Glucose, capillary  Result Value Ref Range   Glucose-Capillary 98 70 - 99 mg/dL  Glucose, capillary  Result Value Ref Range   Glucose-Capillary 132 (H) 70 - 99 mg/dL  Glucose, capillary  Result Value Ref Range   Glucose-Capillary 121 (H) 70 - 99 mg/dL   Comment 1 Notify RN   Glucose, capillary  Result Value Ref Range   Glucose-Capillary 178 (H) 70 - 99 mg/dL  Lipase, blood  Result Value Ref Range   Lipase 22 11 - 51 U/L  Glucose, capillary  Result Value Ref Range   Glucose-Capillary 203 (H) 70 - 99 mg/dL   Comment 1 Notify RN   Glucose, capillary  Result Value Ref Range   Glucose-Capillary 180 (H) 70 - 99 mg/dL   Comment 1 Notify RN   Glucose, capillary  Result Value Ref Range   Glucose-Capillary 145 (H) 70 - 99 mg/dL   Comment 1 Notify RN   CBC  Result Value Ref Range   WBC 9.5 4.0 - 10.5 K/uL   RBC 5.25 (H) 3.87 - 5.11 MIL/uL   Hemoglobin 14.3 12.0 - 15.0 g/dL   HCT 44.7 36.0 - 46.0 %   MCV 85.1 80.0 - 100.0 fL    MCH 27.2 26.0 - 34.0 pg   MCHC 32.0 30.0 - 36.0 g/dL   RDW 14.1 11.5 - 15.5 %   Platelets 166 150 - 400 K/uL  nRBC 0.0 0.0 - 0.2 %  Comprehensive metabolic panel  Result Value Ref Range   Sodium 136 135 - 145 mmol/L   Potassium 3.1 (L) 3.5 - 5.1 mmol/L   Chloride 99 98 - 111 mmol/L   CO2 28 22 - 32 mmol/L   Glucose, Bld 165 (H) 70 - 99 mg/dL   BUN 9 8 - 23 mg/dL   Creatinine, Ser 0.53 0.44 - 1.00 mg/dL   Calcium 8.5 (L) 8.9 - 10.3 mg/dL   Total Protein 6.6 6.5 - 8.1 g/dL   Albumin 3.4 (L) 3.5 - 5.0 g/dL   AST 31 15 - 41 U/L   ALT 33 0 - 44 U/L   Alkaline Phosphatase 117 38 - 126 U/L   Total Bilirubin 0.7 0.3 - 1.2 mg/dL   GFR calc non Af Amer >60 >60 mL/min   GFR calc Af Amer >60 >60 mL/min   Anion gap 9 5 - 15  Glucose, capillary  Result Value Ref Range   Glucose-Capillary 179 (H) 70 - 99 mg/dL   Comment 1 Notify RN   Glucose, capillary  Result Value Ref Range   Glucose-Capillary 236 (H) 70 - 99 mg/dL   Comment 1 Notify RN   Glucose, capillary  Result Value Ref Range   Glucose-Capillary 150 (H) 70 - 99 mg/dL  Glucose, capillary  Result Value Ref Range   Glucose-Capillary 141 (H) 70 - 99 mg/dL  Glucose, capillary  Result Value Ref Range   Glucose-Capillary 210 (H) 70 - 99 mg/dL  ECHOCARDIOGRAM COMPLETE  Result Value Ref Range   Weight 3,294.55 oz   Height 65 in   BP 159/56 mmHg  Surgical pathology  Result Value Ref Range   SURGICAL PATHOLOGY      Surgical Pathology CASE: [id number] PATIENT: Trany Reason Surgical Pathology Report     SPECIMEN SUBMITTED: A. Gallbladder  CLINICAL HISTORY: None provided  PRE-OPERATIVE DIAGNOSIS: C  POST-OPERATIVE DIAGNOSIS: Gallstone pancreatitis     DIAGNOSIS: A.  GALLBLADDER; CHOLECYSTECTOMY: - CHRONIC CHOLECYSTITIS WITH CHOLELITHIASIS AND CHOLESTEROLOSIS. - NEGATIVE FOR ATYPIA AND MALIGNANCY.   GROSS DESCRIPTION: A. Labeled: Gallbladder Received: Formalin Size of specimen: 10.5 x 3.5 x 1.8  cm Previously opened: No External surface: Tan-pink and smooth with multifocal areas of minute hemorrhage Wall thickness: 0.2 cm (uniform) Mucosa: The mucosa displays diffuse, possible yellow excrescences and is otherwise tan-green and velvety.  No polyps or additional abnormalities are grossly identified. Stones present: Yes; multiple friable black stones (7.0 x 4.5 x 0.3 cm in aggregate) are present in the lumen admixed with green bile. Other findings: No cystic d uct lymph node is identified.  Block summary: 1 - cystic duct surgical resection margin (inked blue, en face) and gallbladder wall   Final Diagnosis performed by Allena Napoleon, MD.   Electronically signed 09/14/2018 12:42:10PM The electronic signature indicates that the named Attending Pathologist has evaluated the specimen  Technical component performed at [facility], [address] [place], [place] [address] Lab: [phone] Dir: Rush Farmer, MD, MMM  Professional component performed at [facility], [facility], [place], [place], [place] [address] Lab: [phone] Dir: Dellia Nims. Reuel Derby, MD       Assessment & Plan:   Problem List Items Addressed This Visit      Digestive   RESOLVED: Acute pancreatitis - Primary    Resolved. Rechecking labs today. Will resolve off list.       Relevant Orders   Comprehensive metabolic panel   Amylase   Lipase  Other   Hypokalemia    Rechecking levels today. Await results.        Other Visit Diagnoses    Rash       Concern for reaction to soap from surgery. Will treat with triamcinalone and bactroban. Call with any concerns or if not getting better.        Follow up plan: Return As scheduled.

## 2018-09-20 NOTE — Assessment & Plan Note (Signed)
Resolved. Rechecking labs today. Will resolve off list.

## 2018-09-21 LAB — COMPREHENSIVE METABOLIC PANEL
ALT: 18 IU/L (ref 0–32)
AST: 16 IU/L (ref 0–40)
Albumin/Globulin Ratio: 1.6 (ref 1.2–2.2)
Albumin: 3.8 g/dL (ref 3.8–4.8)
Alkaline Phosphatase: 148 IU/L — ABNORMAL HIGH (ref 39–117)
BUN / CREAT RATIO: 10 — AB (ref 12–28)
BUN: 7 mg/dL — ABNORMAL LOW (ref 8–27)
Bilirubin Total: 0.3 mg/dL (ref 0.0–1.2)
CO2: 20 mmol/L (ref 20–29)
Calcium: 8.8 mg/dL (ref 8.7–10.3)
Chloride: 97 mmol/L (ref 96–106)
Creatinine, Ser: 0.7 mg/dL (ref 0.57–1.00)
GFR calc Af Amer: 102 mL/min/{1.73_m2} (ref >59)
GFR calc non Af Amer: 88 mL/min/{1.73_m2} (ref >59)
GLOBULIN, TOTAL: 2.4 g/dL (ref 1.5–4.5)
Glucose: 141 mg/dL — ABNORMAL HIGH (ref 65–99)
Potassium: 4.3 mmol/L (ref 3.5–5.2)
Sodium: 137 mmol/L (ref 134–144)
Total Protein: 6.2 g/dL (ref 6.0–8.5)

## 2018-09-21 LAB — AMYLASE: Amylase: 42 U/L (ref 31–110)

## 2018-09-21 LAB — LIPASE: Lipase: 26 U/L (ref 14–72)

## 2018-10-03 ENCOUNTER — Ambulatory Visit: Payer: Self-pay

## 2018-10-03 ENCOUNTER — Ambulatory Visit: Payer: Medicare Other | Admitting: Family Medicine

## 2018-10-19 ENCOUNTER — Telehealth: Payer: Self-pay | Admitting: Family Medicine

## 2018-10-19 NOTE — Telephone Encounter (Unsigned)
Copied from [place] [phone]. Topic: Medicare AWV >> Oct 19, 2018  4:07 PM Sherren Kerns wrote: Called to RESCHEDULE Medicare Annual Wellness Visit with the Nurse Health Advisor. ORIGINAL appointment was 10/03/2018.  No answer. Left message.  If patient returns call, please note: their last AWV was 09/30/2017, please schedule AWV-s with [facility] any date AFTER 09/30/2018.    Thank you! For any questions please contact: Janace Hoard at [phone] or Skype [email or web address]

## 2018-10-28 ENCOUNTER — Ambulatory Visit: Payer: Medicare Other | Admitting: Family Medicine

## 2018-11-04 ENCOUNTER — Other Ambulatory Visit: Payer: Self-pay | Admitting: Family Medicine

## 2018-11-07 ENCOUNTER — Ambulatory Visit: Payer: Medicare Other

## 2018-11-08 ENCOUNTER — Other Ambulatory Visit: Payer: Self-pay | Admitting: Family Medicine

## 2018-11-08 NOTE — Telephone Encounter (Signed)
Requested Prescriptions  Pending Prescriptions Disp Refills  . carvedilol (COREG) 12.5 MG tablet [Pharmacy Med Name: Carvedilol 12.5 MG Oral Tablet] 360 tablet 0    Sig: TAKE 2 TABLETS BY MOUTH TWICE DAILY WITH A MEAL     Cardiovascular:  Beta Blockers Failed - 11/08/2018  5:30 AM      Failed - Last BP in normal range    BP Readings from Last 1 Encounters:  09/20/18 (!) 153/89         Passed - Last Heart Rate in normal range    Pulse Readings from Last 1 Encounters:  09/20/18 87         Passed - Valid encounter within last 6 months    Recent Outpatient Visits          1 month ago Acute biliary pancreatitis, unspecified complication status   [facility] [place], [name], DO   6 months ago Hypertensive renal disease   [facility] [place], [name], DO   7 months ago Coronary artery disease involving native coronary artery of native heart without angina pectoris   [place], [name], DO   1 year ago Chronic obstructive pulmonary disease, unspecified COPD type [facility])   [place], Rachel Elizabeth, PA-C   1 year ago Chronic obstructive pulmonary disease, unspecified COPD type [facility])   [place], [name], DO

## 2018-11-15 ENCOUNTER — Encounter: Payer: Self-pay | Admitting: Family Medicine

## 2018-11-16 ENCOUNTER — Ambulatory Visit: Payer: Medicare Other

## 2018-11-16 ENCOUNTER — Encounter: Payer: Self-pay | Admitting: Family Medicine

## 2018-11-16 MED ORDER — PREDNISONE 50 MG PO TABS: 50.0000 mg | ORAL_TABLET | Freq: Every day | ORAL | 0 refills | Status: DC

## 2018-11-20 ENCOUNTER — Encounter: Payer: Self-pay | Admitting: Family Medicine

## 2018-11-22 ENCOUNTER — Telehealth: Payer: Self-pay | Admitting: Family Medicine

## 2018-11-22 MED ORDER — PREDNISONE 50 MG PO TABS: 50.0000 mg | ORAL_TABLET | Freq: Every day | ORAL | 0 refills | Status: DC

## 2018-11-22 NOTE — Telephone Encounter (Signed)
Copied from [place] [phone]. Topic: MyChart - Question >> Nov 22, 2018  2:10 PM Reyne Dumas L wrote: Pt called [facility] mailbox about MyChart message from 11/20/2018.  States that she only needs two or three more prednisone. Pt can be reached at [phone].

## 2018-11-22 NOTE — Telephone Encounter (Signed)
Already sent over

## 2018-12-01 ENCOUNTER — Ambulatory Visit: Payer: Medicare Other

## 2018-12-25 ENCOUNTER — Other Ambulatory Visit: Payer: Self-pay | Admitting: Family Medicine

## 2019-02-08 ENCOUNTER — Telehealth: Payer: Self-pay | Admitting: Family Medicine

## 2019-02-08 ENCOUNTER — Other Ambulatory Visit: Payer: Self-pay | Admitting: Family Medicine

## 2019-02-08 DIAGNOSIS — E1122 Type 2 diabetes mellitus with diabetic chronic kidney disease: Secondary | ICD-10-CM

## 2019-02-08 DIAGNOSIS — I129 Hypertensive chronic kidney disease with stage 1 through stage 4 chronic kidney disease, or unspecified chronic kidney disease: Secondary | ICD-10-CM

## 2019-02-08 DIAGNOSIS — E782 Mixed hyperlipidemia: Secondary | ICD-10-CM

## 2019-02-08 NOTE — Telephone Encounter (Signed)
Pt called in scheduled for 6/23 for in person appt wants to know if orders for labs can be put in and then she does virtual. Please advise.

## 2019-02-08 NOTE — Telephone Encounter (Signed)
Requested Prescriptions  Pending Prescriptions Disp Refills  . sertraline (ZOLOFT) 100 MG tablet [Pharmacy Med Name: Sertraline HCl 100 MG Oral Tablet] 90 tablet 0    Sig: Take 1 tablet by mouth once daily     Psychiatry:  Antidepressants - SSRI Passed - 02/08/2019 12:36 PM      Passed - Valid encounter within last 6 months    Recent Outpatient Visits          4 months ago Acute biliary pancreatitis, unspecified complication status   [facility] [place], [name], DO   9 months ago Hypertensive renal disease   [facility] [place], [name], DO   10 months ago Coronary artery disease involving native coronary artery of native heart without angina pectoris   [place], [name], DO   1 year ago Chronic obstructive pulmonary disease, unspecified COPD type [facility])   [place], Rachel Elizabeth, PA-C   1 year ago Chronic obstructive pulmonary disease, unspecified COPD type ([place])   [place], [name], DO      Future Appointments            In 1 week [facility], [facility]            Passed - Completed PHQ-2 or PHQ-9 in the last 360 days.

## 2019-02-08 NOTE — Telephone Encounter (Signed)
Left message on machine for pt to return call to the office.  

## 2019-02-08 NOTE — Telephone Encounter (Signed)
Overdue for follow up. Please get her scheduled

## 2019-02-09 NOTE — Telephone Encounter (Signed)
Orders are in. She can do a virtual. Just come in for her labs before hand. Thanks!

## 2019-02-09 NOTE — Telephone Encounter (Signed)
Pt returned call and office acknowledged and call will be returned [phone]  [phone]

## 2019-02-09 NOTE — Telephone Encounter (Signed)
Left message on machine for pt to return call to the office.  

## 2019-02-09 NOTE — Telephone Encounter (Signed)
Pt scheduled to have labs done next Wednesday when she comes in for wellness visit. OV switched to virtual.

## 2019-02-13 ENCOUNTER — Telehealth: Payer: Self-pay

## 2019-02-13 NOTE — Telephone Encounter (Signed)
Called to discuss AWV appt for 02/15/2019. Left message for pt to call back- direct number [phone]

## 2019-02-14 ENCOUNTER — Telehealth: Payer: Self-pay | Admitting: Family Medicine

## 2019-02-14 NOTE — Telephone Encounter (Signed)
Called pt to go over screening questions no answer left vm

## 2019-02-15 ENCOUNTER — Ambulatory Visit (INDEPENDENT_AMBULATORY_CARE_PROVIDER_SITE_OTHER): Payer: Medicare Other

## 2019-02-15 ENCOUNTER — Other Ambulatory Visit: Payer: Self-pay

## 2019-02-15 ENCOUNTER — Other Ambulatory Visit: Payer: Medicare Other

## 2019-02-15 ENCOUNTER — Ambulatory Visit (INDEPENDENT_AMBULATORY_CARE_PROVIDER_SITE_OTHER): Payer: Medicare Other | Admitting: Pharmacist

## 2019-02-15 VITALS — BP 138/70 | HR 82 | Temp 97.4°F | Resp 15 | Ht 65.0 in | Wt 184.8 lb

## 2019-02-15 DIAGNOSIS — N183 Chronic kidney disease, stage 3 unspecified: Secondary | ICD-10-CM

## 2019-02-15 DIAGNOSIS — R0602 Shortness of breath: Secondary | ICD-10-CM

## 2019-02-15 DIAGNOSIS — E1122 Type 2 diabetes mellitus with diabetic chronic kidney disease: Secondary | ICD-10-CM

## 2019-02-15 DIAGNOSIS — Z0000 Encounter for general adult medical examination without abnormal findings: Secondary | ICD-10-CM | POA: Diagnosis not present

## 2019-02-15 DIAGNOSIS — E782 Mixed hyperlipidemia: Secondary | ICD-10-CM | POA: Diagnosis not present

## 2019-02-15 DIAGNOSIS — I129 Hypertensive chronic kidney disease with stage 1 through stage 4 chronic kidney disease, or unspecified chronic kidney disease: Secondary | ICD-10-CM

## 2019-02-15 LAB — MICROALBUMIN, URINE WAIVED
Creatinine, Urine Waived: 50 mg/dL (ref 10–300)
Microalb, Ur Waived: 10 mg/L (ref 0–19)

## 2019-02-15 LAB — BAYER DCA HB A1C WAIVED: HB A1C (BAYER DCA - WAIVED): 7.9 % — ABNORMAL HIGH (ref <7.0)

## 2019-02-15 NOTE — Progress Notes (Signed)
Subjective:   Sarah White is a 73 y.o. female who presents for Medicare Annual (Subsequent) preventive examination.    Review of Systems:   Cardiac Risk Factors include: advanced age (>95men, >35 women);hypertension;dyslipidemia;diabetes mellitus;obesity (BMI >30kg/m2);smoking/ tobacco exposure     Objective:     Vitals: BP 138/70 (BP Location: Right Arm, Patient Position: Sitting, Cuff Size: Normal)   Pulse 82   Temp (!) 97.4 F (36.3 C) (Temporal)   Resp 15   Ht 5\' 5"  (1.651 m)   Wt 184 lb 12.8 oz (83.8 kg)   BMI 30.75 kg/m   Body mass index is 30.75 kg/m.  Advanced Directives 02/15/2019 09/09/2018 09/08/2018 09/30/2017 08/19/2017 11/26/2016 03/11/2016  Does Patient Have a Medical Advance Directive? Yes No No Yes Yes Yes Yes  Type of Advance Directive Living will;Healthcare Power of [place];Living will [place];Living will [place];Living will Living will  Does patient want to make changes to medical advance directive? - - - - - No - Patient declined No - Patient declined  Copy of [place] in Chart? Yes - validated most recent copy scanned in chart (See row information) - - Yes - No - copy requested Yes  Would patient like information on creating a medical advance directive? - No - Patient declined No - Patient declined - - - -    Tobacco Social History   Tobacco Use  Smoking Status Current Every Day Smoker  . Packs/day: 0.50  . Types: Cigarettes  Smokeless Tobacco Never Used     Ready to quit: No Counseling given: Yes   Clinical Intake:  Pre-visit preparation completed: Yes  Pain : No/denies pain     Nutritional Status: BMI > 30  Obese Nutritional Risks: None Diabetes: Yes CBG done?: No Did pt. bring in CBG monitor from home?: No  How often do you need to have someone help you when you read instructions, pamphlets, or other written materials from your doctor or  pharmacy?: 1 - Never  Nutrition Risk Assessment:  Has the patient had any N/V/D within the last 2 months?  No  Does the patient have any non-healing wounds?  No  Has the patient had any unintentional weight loss or weight gain?  No   Diabetes:  Is the patient diabetic?  Yes  If diabetic, was a CBG obtained today?  No  124 at home this afternoon  Did the patient bring in their glucometer from home? No  How often do you monitor your CBG's? 2-3 times a day   Financial Strains and Diabetes Management:  Are you having any financial strains with the device, your supplies or your medication? Yes .  Does the patient want to be seen by Chronic Care Management for management of their diabetes?  Yes  referred to [place] Would the patient like to be referred to a Nutritionist or for Diabetic Management?  No   Diabetic Exams:  Diabetic Eye Exam: declined due to insurance coverage   Diabetic Foot Exam: Completed 03/24/2018   Interpreter Needed?: No  Information entered by :: Tiffany Hill,LPN  Past Medical History:  Diagnosis Date  . Acute pancreatitis 09/09/2018  . Acute respiratory failure with hypoxia and hypercarbia ([place]) 07/22/2015  . Cancer ([place])    uterine  . CHF (congestive heart failure) ([place])   . COPD (chronic obstructive pulmonary disease) ([place])   . Diabetes mellitus without complication ([place])   .  Encephalopathy acute 07/22/2015  . Hypertension   . Pneumonia    November 2016  . Pulmonary edema 07/22/2015  . 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 ABDOMINAL HYSTERECTOMY    . CARPAL TUNNEL RELEASE Bilateral   . CESAREAN SECTION    . CHOLECYSTECTOMY N/A 09/12/2018   Procedure: LAPAROSCOPIC CHOLECYSTECTOMY WITH INTRAOPERATIVE CHOLANGIOGRAM;  Surgeon: Herbert Pun, MD;  Location: [facility] ORS;  Service: General;  Laterality: N/A;  . CORONARY ANGIOPLASTY WITH STENT PLACEMENT    . KNEE SURGERY Left    . LEFT HEART CATH AND CORONARY ANGIOGRAPHY Right 11/26/2016   Procedure: Left Heart Cath and Coronary Angiography;  Surgeon: Isaias Cowman, MD;  Location: [facility];  Service: Cardiovascular;  Laterality: Right;   Family History  Problem Relation Age of Onset  . Heart disease Mother    Social History   Socioeconomic History  . Marital status: Divorced    Spouse name: Not on file  . Number of children: Not on file  . Years of education: Not on file  . Highest education level: Some college, no degree  Occupational History  . Occupation: retired  Scientific laboratory technician  . Financial resource strain: Somewhat hard  . Food insecurity    Worry: Never true    Inability: Never true  . Transportation needs    Medical: No    Non-medical: No  Tobacco Use  . Smoking status: Current Every Day Smoker    Packs/day: 0.50    Types: Cigarettes  . Smokeless tobacco: Never Used  Substance and Sexual Activity  . Alcohol use: No  . Drug use: No  . Sexual activity: Never  Lifestyle  . Physical activity    Days per week: 0 days    Minutes per session: 0 min  . Stress: Not at all  Relationships  . Social connections    Talks on phone: More than three times a week    Gets together: Once a week    Attends religious service: Never    Active member of club or organization: No    Attends meetings of clubs or organizations: Never    Relationship status: Widowed  Other Topics Concern  . Not on file  Social History Narrative  . Not on file    Outpatient Encounter Medications as of 02/15/2019  Medication Sig  . aspirin EC 81 MG tablet Take 81 mg by mouth at bedtime.   Marland Kitchen atorvastatin (LIPITOR) 40 MG tablet Take 1 tablet (40 mg total) by mouth daily.  . carvedilol (COREG) 12.5 MG tablet TAKE 2 TABLETS BY MOUTH TWICE DAILY WITH A MEAL  . diphenhydrAMINE (BENADRYL) 25 MG tablet Take 25 mg by mouth every 6 (six) hours as needed for allergies.  . Dulaglutide (TRULICITY) 1.5 IF/0.2DX SOPN  Inject 1.5 mg into the skin once a week.  . furosemide (LASIX) 40 MG tablet Take 1 tablet (40 mg total) by mouth daily.  . isosorbide mononitrate (IMDUR) 60 MG 24 hr tablet Take 1 tablet (60 mg total) by mouth daily.  . nitroGLYCERIN (NITROSTAT) 0.4 MG SL tablet Place 1 tablet (0.4 mg total) under the tongue every 5 (five) minutes as needed for chest pain.  Marland Kitchen ondansetron (ZOFRAN-ODT) 4 MG disintegrating tablet Take 4 mg by mouth every 8 (eight) hours as needed for nausea or vomiting.  Marland Kitchen PROAIR HFA 108 (90 Base) MCG/ACT inhaler INHALE 1 TO 2 PUFFS BY MOUTH EVERY 4  HOURS AS NEEDED FOR WHEEZING OR SHORTNESS OF BREATH  . sertraline (ZOLOFT) 100 MG tablet Take 1 tablet by mouth once daily  . tiotropium (SPIRIVA HANDIHALER) 18 MCG inhalation capsule Place 1 capsule (18 mcg total) into inhaler and inhale daily.  . traZODone (DESYREL) 50 MG tablet TAKE 1/2 TO 1 (ONE-HALF TO ONE) TABLET BY MOUTH AT BEDTIME AS NEEDED FOR SLEEP  . [DISCONTINUED] mupirocin ointment (BACTROBAN) 2 % Place 1 application into the nose 2 (two) times daily.  . [DISCONTINUED] oxyCODONE-acetaminophen (PERCOCET) 5-325 MG tablet Take 1 tablet by mouth every 4 (four) hours as needed for severe pain.  . [DISCONTINUED] predniSONE (DELTASONE) 50 MG tablet Take 1 tablet (50 mg total) by mouth daily with breakfast.  . [DISCONTINUED] triamcinolone ointment (KENALOG) 0.5 % Apply 1 application topically 2 (two) times daily.   No facility-administered encounter medications on file as of 02/15/2019.     Activities of Daily Living In your present state of health, do you have any difficulty performing the following activities: 02/15/2019 09/09/2018  Hearing? Y N  Vision? N N  Difficulty concentrating or making decisions? Y N  Comment sometimes it comes back -  Walking or climbing stairs? Y Y  Dressing or bathing? N N  Doing errands, shopping? Tempie Donning  Comment son drives -  Conservation officer, nature and eating ? Y -  Comment son helps cook -  Using the  Toilet? N -  In the past six months, have you accidently leaked urine? N -  Do you have problems with loss of bowel control? N -  Managing your Medications? N -  Managing your Finances? N -  Housekeeping or managing your Housekeeping? Y -  Comment son assists -  Some recent data might be hidden    [name], [name] Yolonda Kida, MD as Consulting Physician (Cardiology) Yolonda Kida, MD as Consulting Physician (Cardiology) De Hollingshead, [facility] as Pharmacist (Pharmacist)    Assessment:   This is a routine wellness examination for Sarah White.  Exercise Activities and Dietary recommendations Current Exercise Habits: Home exercise routine, Time (Minutes): 10, Frequency (Times/Week): 5, Weekly Exercise (Minutes/Week): 50, Intensity: Mild, Exercise limited by: None identified  Goals    . Quit Smoking     Smoking cessation discussed       Fall Risk: Fall Risk  02/15/2019 05/05/2018 03/24/2018 09/30/2017 09/15/2016  Falls in the past year? 1 No No Yes Yes  Number falls in past yr: 0 - - 2 or more 2 or more  Injury with Fall? 1 - - No No  Risk for fall due to : Impaired balance/gait - - - -  Follow up Falls prevention discussed;Falls evaluation completed;Education provided - - - Falls evaluation completed    FALL RISK PREVENTION PERTAINING TO THE HOME:  Any stairs in or around the home? No  If so, are there any without handrails? n/a  Home free of loose throw rugs in walkways, pet beds, electrical cords, etc? Yes  Adequate lighting in your home to reduce risk of falls? Yes   ASSISTIVE DEVICES UTILIZED TO PREVENT FALLS:  Life alert? No  Use of a cane, walker or w/c? Yes   Walker occasionally  Grab bars in the bathroom? Yes  Shower chair or bench in shower? Yes  Elevated toilet seat or a handicapped toilet? Yes   TIMED UP AND GO:  Was the test performed? Yes .  Length of time to  ambulate 10 feet: 12 sec.    GAIT:  Appearance of gait: Gait steady and fast without the use of an assistive device. Education: Fall risk prevention has been discussed.  Intervention(s) required? no DME/home health order needed?  No    Depression Screen PHQ 2/9 Scores 02/15/2019 05/05/2018 03/24/2018 09/30/2017  PHQ - 2 Score 0 0 4 1  PHQ- 9 Score - 5 14 4      Cognitive Function     6CIT Screen 02/15/2019 09/30/2017  What Year? 0 points 0 points  What month? 0 points 0 points  What time? 0 points 0 points  Count back from 20 0 points 0 points  Months in reverse 0 points 0 points  Repeat phrase 2 points 0 points  Total Score 2 0    Immunization History  Administered Date(s) Administered  . Influenza, High Dose Seasonal PF 05/18/2016, 05/25/2017, 05/05/2018  . Influenza, Seasonal, Injecte, Preservative Fre 06/05/2015  . Pneumococcal Conjugate-13 07/29/2015  . Pneumococcal Polysaccharide-23 09/15/2016  . Tdap 09/24/2015  . Zoster 02/07/2015    Qualifies for Shingles Vaccine? Yes  Zostavax completed 02/07/2015. Due for Shingrix. Education has been provided regarding the importance of this vaccine. Pt has been advised to call insurance company to determine out of pocket expense. Advised may also receive vaccine at local pharmacy or Health Dept. Verbalized acceptance and understanding.  Tdap: up to date   Flu Vaccine: up to date   Pneumococcal Vaccine: up to date   Screening Tests Health Maintenance  Topic Date Due  . HEMOGLOBIN A1C  09/24/2018  . URINE MICROALBUMIN  03/25/2019  . MAMMOGRAM  03/25/2019 (Originally 07/05/1998)  . OPHTHALMOLOGY EXAM  03/25/2019 (Originally 09/16/2016)  . DEXA SCAN  03/25/2019 (Originally 07/05/2013)  . COLONOSCOPY  03/25/2019 (Originally 07/05/1998)  . FOOT EXAM  03/25/2019  . INFLUENZA VACCINE  04/01/2019  . TETANUS/TDAP  09/23/2025  . Hepatitis C Screening  Completed  . PNA vac Low Risk Adult  Completed    Cancer Screenings:  Colorectal Screening: declined   Mammogram: declined  Bone Density: declied  Lung Cancer Screening: (Low Dose CT Chest recommended if Age 55-80 years, 30 pack-year currently smoking OR have quit w/in 15years.) does not qualify.    Additional Screening:  Hepatitis C Screening: does qualify; Completed 08/12/2015  Dental Screening: Recommended annual dental exams for proper oral hygiene   Community Resource Referral:  CRR required this visit?  Yes  needs assistance with diabetic eye exam.      Plan:  I have personally reviewed and addressed the Medicare Annual Wellness questionnaire and have noted the following in the patient's chart:  A. Medical and social history B. Use of alcohol, tobacco or illicit drugs  C. Current medications and supplements D. Functional ability and status E.  Nutritional status F.  Physical activity G. Advance directives H. List of other physicians I.  Hospitalizations, surgeries, and ER visits in previous 12 months J.  [place] such as hearing and vision if needed, cognitive and depression L. Referrals and appointments   In addition, I have reviewed and discussed with patient certain preventive protocols, quality metrics, and best practice recommendations. A written personalized care plan for preventive services as well as general preventive health recommendations were provided to patient.   Signed,    Bevelyn Ngo, LPN  12/17/6220 Nurse Health Advisor   Nurse Notes: none

## 2019-02-15 NOTE — Chronic Care Management (AMB) (Signed)
Chronic Care Management   Note  02/15/2019 Name: Sarah White MRN: [id number] DOB: 25-Sep-1945   Subjective:  Sarah White is a 73 y.o. year old female who is a primary care patient of Valerie Roys, DO. The CCM team was consulted for assistance with chronic disease management and care coordination needs.    Met with patient face to face during annual wellness visit today.   Sarah White was given information about Chronic Care Management services today including:  1. CCM service includes personalized support from designated clinical staff supervised by her physician, including individualized plan of care and coordination with other care providers 2. 24/7 contact phone numbers for assistance for urgent and routine care needs. 3. Service will only be billed when office clinical staff spend 20 minutes or more in a month to coordinate care. 4. Only one practitioner may furnish and bill the service in a calendar month. 5. The patient may stop CCM services at any time (effective at the end of the month) by phone call to the office staff. 6. The patient will be responsible for cost sharing (co-pay) of up to 20% of the service fee (after annual deductible is met).  Patient agreed to services and verbal consent obtained.   Review of patient status, including review of consultants reports, laboratory and other test data, was performed as part of comprehensive evaluation and provision of chronic care management services.   Objective:  Lab Results  Component Value Date   CREATININE 0.70 09/20/2018   CREATININE 0.53 09/13/2018   CREATININE 0.56 09/10/2018    Lab Results  Component Value Date   HGBA1C 7.9 (H) 02/15/2019       Component Value Date/Time   CHOL 177 03/24/2018 1043   CHOL 164 02/04/2016 1043   CHOL 258 (H) 01/16/2014 1200   TRIG 256 (H) 03/24/2018 1043   TRIG 258 (H) 02/04/2016 1043   TRIG 337 (H) 01/16/2014 1200   HDL 34 (L) 03/24/2018 1043   HDL 35 (L) 01/16/2014  1200   CHOLHDL 4.3 11/26/2016 0810   VLDL 51 (H) 11/26/2016 0810   VLDL 52 (H) 02/04/2016 1043   VLDL 67 (H) 01/16/2014 1200   LDLCALC 92 03/24/2018 1043   LDLCALC 156 (H) 01/16/2014 1200    Clinical ASCVD: No  The 10-year ASCVD risk score [name], et al., 2013) is: 41.2%   Values used to calculate the score:     Age: 82 years     Sex: Female     Is Non-Hispanic African American: No     Diabetic: Yes     Tobacco smoker: Yes     Systolic Blood Pressure: 333 mmHg     Is BP treated: Yes     HDL Cholesterol: 34 mg/dL     Total Cholesterol: 177 mg/dL    BP Readings from Last 3 Encounters:  02/15/19 138/70  09/20/18 (!) 153/89  09/14/18 (!) 157/66    Allergies  Allergen Reactions  . Amlodipine Swelling  . Codeine Nausea And Vomiting  . Hctz [Hydrochlorothiazide] Other (See Comments)    Hypokalemia  . Lisinopril Other (See Comments)    Dizziness   . Metformin And Related Nausea Only    Nausea, dizziness    Medications Reviewed Today    Reviewed by Bevelyn Ngo, LPN (Licensed Practical Nurse) on 02/15/19 at 43  Med List Status: <None>  Medication Order Taking? Sig Documenting Provider Last Dose Status Informant  aspirin EC 81 MG tablet [id number]  Yes Take 81 mg by mouth at bedtime.  [provider] Taking Active Self  atorvastatin (LIPITOR) 40 MG tablet [id number] Yes Take 1 tablet (40 mg total) by mouth daily. [place] Liter P, DO Taking Active Self  carvedilol (COREG) 12.5 MG tablet [id number] Yes TAKE 2 TABLETS BY MOUTH TWICE DAILY WITH A MEAL Johnson, [name], DO Taking Active   diphenhydrAMINE (BENADRYL) 25 MG tablet [id number] Yes Take 25 mg by mouth every 6 (six) hours as needed for allergies. [provider] Taking Active Self  Dulaglutide (TRULICITY) 1.5 PQ/2.4SL SOPN [id number] Yes Inject 1.5 mg into the skin once a week. [place] Liter P, DO Taking Active Self  furosemide (LASIX) 40 MG tablet [id number] Yes Take 1 tablet (40 mg total) by mouth  daily. [place] Liter P, DO Taking Active Self  isosorbide mononitrate (IMDUR) 60 MG 24 hr tablet [id number] Yes Take 1 tablet (60 mg total) by mouth daily. Valerie Roys, DO Taking Active Self        Discontinued 02/15/19 1426 (Patient Preference)   nitroGLYCERIN (NITROSTAT) 0.4 MG SL tablet [id number] Yes Place 1 tablet (0.4 mg total) under the tongue every 5 (five) minutes as needed for chest pain. [place] Liter P, DO Taking Active Self  ondansetron (ZOFRAN-ODT) 4 MG disintegrating tablet [id number] Yes Take 4 mg by mouth every 8 (eight) hours as needed for nausea or vomiting. [provider] Taking Active Self        Discontinued 02/15/19 1426 (Patient Preference)         Discontinued 02/15/19 1426   PROAIR HFA 108 (90 Base) MCG/ACT inhaler [id number] Yes INHALE 1 TO 2 PUFFS BY MOUTH EVERY 4 HOURS AS NEEDED FOR WHEEZING OR SHORTNESS OF BREATH Johnson, [name], DO Taking Active Self  sertraline (ZOLOFT) 100 MG tablet [id number] Yes Take 1 tablet by mouth once daily Johnson, [name], DO Taking Active   tiotropium (SPIRIVA HANDIHALER) 18 MCG inhalation capsule [id number] Yes Place 1 capsule (18 mcg total) into inhaler and inhale daily. [place] Liter P, DO Taking Active Self  traZODone (DESYREL) 50 MG tablet [id number] Yes TAKE 1/2 TO 1 (ONE-HALF TO ONE) TABLET BY MOUTH AT BEDTIME AS NEEDED FOR SLEEP Valerie Roys, DO Taking Active         Discontinued 02/15/19 1426 (Patient Preference)            Assessment:   Goals Addressed            This Visit's Progress     Patient Stated   . "I can't afford my medications" (pt-stated)       Current Barriers:  . Financial Barriers- patient reports that she has reached the Medicare Coverage Gap, and cannot afford to fill Trulicity. Has 1 pen left. Spiriva is also expensive  Pharmacist Clinical Goal(s):  Marland Kitchen Over the next 30 days, patient will work with PharmD and primary care provider to address needs related to medication access   Interventions: . Patient qualifies for Trulicity patient assistance through [place] patient assistance through [employer]. Completed patient portion of the application with her today - will collaborate with primary care provider Dr. Wynetta Emery for her signature.  . Patient will bring proof of income (copy of 2020 awards letter and pension statement) to clinic to be included in application  Patient Self Care Activities:  . Self administers medications as prescribed  Initial goal documentation        Plan: - Once provider portion and patient income information is  received, will submit applications to respective companies.   [name], PharmD Clinical Pharmacist [place] [phone]

## 2019-02-15 NOTE — Patient Instructions (Signed)
Visit Information  Goals      Patient Stated   . "I can't afford my medications" (pt-stated)     Current Barriers:  . Financial Barriers- patient reports that she has reached the Medicare Coverage Gap, and cannot afford to fill Trulicity. Has 1 pen left. Spiriva is also expensive  Pharmacist Clinical Goal(s):  Marland Kitchen Over the next 30 days, patient will work with PharmD and primary care provider to address needs related to medication access  Interventions: . Patient qualifies for Trulicity patient assistance through [place] patient assistance through [employer]. Completed patient portion of the application with her today - will collaborate with primary care provider Dr. Wynetta Emery for her signature.  . Patient will bring proof of income (copy of 2020 awards letter and pension statement) to clinic to be included in application  Patient Self Care Activities:  . Self administers medications as prescribed  Initial goal documentation       Other   . Quit Smoking     Smoking cessation discussed       The patient verbalized understanding of instructions provided today and declined a print copy of patient instruction materials.   Plan: - Once provider portion and patient income information is received, will submit applications to respective companies.   [name], PharmD Clinical Pharmacist [place] [phone]

## 2019-02-15 NOTE — Patient Instructions (Addendum)
Sarah White , Thank you for taking time to come for your Medicare Wellness Visit. I appreciate your ongoing commitment to your health goals. Please review the following plan we discussed and let me know if I can assist you in the future.   Screening recommendations/referrals: Colonoscopy:  declined Mammogram: declined Bone Density: declined Recommended yearly ophthalmology/optometry visit for glaucoma screening and checkup Recommended yearly dental visit for hygiene and checkup  Vaccinations: Influenza vaccine: up to date Pneumococcal vaccine: up to date Tdap vaccine: up to date Shingles vaccine: shingrix eligible, check with your insurance company for coverage     Advanced directives: copy on file  Conditions/risks identified: discussed need for diabetic eye exam- will see if we can get assistance to help pay for this. Placed referral to chronic care management team to discuss you diabetic medications and assistance with trulicity. Katie spoke with you today regarding your trulicity and spirivia. She will contact you for further information.   Next appointment: follow up in one year for your annual wellness exam.    Preventive Care 65 Years and Older, Female Preventive care refers to lifestyle choices and visits with your health care provider that can promote health and wellness. What does preventive care include?  A yearly physical exam. This is also called an annual well check.  Dental exams once or twice a year.  Routine eye exams. Ask your health care provider how often you should have your eyes checked.  Personal lifestyle choices, including:  Daily care of your teeth and gums.  Regular physical activity.  Eating a healthy diet.  Avoiding tobacco and drug use.  Limiting alcohol use.  Practicing safe sex.  Taking low-dose aspirin every day.  Taking vitamin and mineral supplements as recommended by your health care provider. What happens during an annual well  check? The services and screenings done by your health care provider during your annual well check will depend on your age, overall health, lifestyle risk factors, and family history of disease. Counseling  Your health care provider may ask you questions about your:  Alcohol use.  Tobacco use.  Drug use.  Emotional well-being.  Home and relationship well-being.  Sexual activity.  Eating habits.  History of falls.  Memory and ability to understand (cognition).  Work and work Statistician.  Reproductive health. Screening  You may have the following tests or measurements:  Height, weight, and BMI.  Blood pressure.  Lipid and cholesterol levels. These may be checked every 5 years, or more frequently if you are over 42 years old.  Skin check.  Lung cancer screening. You may have this screening every year starting at age 4 if you have a 30-pack-year history of smoking and currently smoke or have quit within the past 15 years.  Fecal occult blood test (FOBT) of the stool. You may have this test every year starting at age 23.  Flexible sigmoidoscopy or colonoscopy. You may have a sigmoidoscopy every 5 years or a colonoscopy every 10 years starting at age 23.  Hepatitis C blood test.  Hepatitis B blood test.  Sexually transmitted disease (STD) testing.  Diabetes screening. This is done by checking your blood sugar (glucose) after you have not eaten for a while (fasting). You may have this done every 1-3 years.  Bone density scan. This is done to screen for osteoporosis. You may have this done starting at age 37.  Mammogram. This may be done every 1-2 years. Talk to your health care provider about how often you  should have regular mammograms. Talk with your health care provider about your test results, treatment options, and if necessary, the need for more tests. Vaccines  Your health care provider may recommend certain vaccines, such as:  Influenza vaccine. This is  recommended every year.  Tetanus, diphtheria, and acellular pertussis (Tdap, Td) vaccine. You may need a Td booster every 10 years.  Zoster vaccine. You may need this after age 28.  Pneumococcal 13-valent conjugate (PCV13) vaccine. One dose is recommended after age 76.  Pneumococcal polysaccharide (PPSV23) vaccine. One dose is recommended after age 24. Talk to your health care provider about which screenings and vaccines you need and how often you need them. This information is not intended to replace advice given to you by your health care provider. Make sure you discuss any questions you have with your health care provider. Document Released: 09/13/2015 Document Revised: 05/06/2016 Document Reviewed: 06/18/2015 Elsevier Interactive Patient Education  2017 [place] Prevention in the Home Falls can cause injuries. They can happen to people of all ages. There are many things you can do to make your home safe and to help prevent falls. What can I do on the outside of my home?  Regularly fix the edges of walkways and driveways and fix any cracks.  Remove anything that might make you trip as you walk through a door, such as a raised step or threshold.  Trim any bushes or trees on the path to your home.  Use bright outdoor lighting.  Clear any walking paths of anything that might make someone trip, such as rocks or tools.  Regularly check to see if handrails are loose or broken. Make sure that both sides of any steps have handrails.  Any raised decks and porches should have guardrails on the edges.  Have any leaves, snow, or ice cleared regularly.  Use sand or salt on walking paths during winter.  Clean up any spills in your garage right away. This includes oil or grease spills. What can I do in the bathroom?  Use night lights.  Install grab bars by the toilet and in the tub and shower. Do not use towel bars as grab bars.  Use non-skid mats or decals in the tub or  shower.  If you need to sit down in the shower, use a plastic, non-slip stool.  Keep the floor dry. Clean up any water that spills on the floor as soon as it happens.  Remove soap buildup in the tub or shower regularly.  Attach bath mats securely with double-sided non-slip rug tape.  Do not have throw rugs and other things on the floor that can make you trip. What can I do in the bedroom?  Use night lights.  Make sure that you have a light by your bed that is easy to reach.  Do not use any sheets or blankets that are too big for your bed. They should not hang down onto the floor.  Have a firm chair that has side arms. You can use this for support while you get dressed.  Do not have throw rugs and other things on the floor that can make you trip. What can I do in the kitchen?  Clean up any spills right away.  Avoid walking on wet floors.  Keep items that you use a lot in easy-to-reach places.  If you need to reach something above you, use a strong step stool that has a grab bar.  Keep electrical cords out  of the way.  Do not use floor polish or wax that makes floors slippery. If you must use wax, use non-skid floor wax.  Do not have throw rugs and other things on the floor that can make you trip. What can I do with my stairs?  Do not leave any items on the stairs.  Make sure that there are handrails on both sides of the stairs and use them. Fix handrails that are broken or loose. Make sure that handrails are as long as the stairways.  Check any carpeting to make sure that it is firmly attached to the stairs. Fix any carpet that is loose or worn.  Avoid having throw rugs at the top or bottom of the stairs. If you do have throw rugs, attach them to the floor with carpet tape.  Make sure that you have a light switch at the top of the stairs and the bottom of the stairs. If you do not have them, ask someone to add them for you. What else can I do to help prevent falls?   Wear shoes that:  Do not have high heels.  Have rubber bottoms.  Are comfortable and fit you well.  Are closed at the toe. Do not wear sandals.  If you use a stepladder:  Make sure that it is fully opened. Do not climb a closed stepladder.  Make sure that both sides of the stepladder are locked into place.  Ask someone to hold it for you, if possible.  Clearly mark and make sure that you can see:  Any grab bars or handrails.  First and last steps.  Where the edge of each step is.  Use tools that help you move around (mobility aids) if they are needed. These include:  Canes.  Walkers.  Scooters.  Crutches.  Turn on the lights when you go into a dark area. Replace any light bulbs as soon as they burn out.  Set up your furniture so you have a clear path. Avoid moving your furniture around.  If any of your floors are uneven, fix them.  If there are any pets around you, be aware of where they are.  Review your medicines with your doctor. Some medicines can make you feel dizzy. This can increase your chance of falling. Ask your doctor what other things that you can do to help prevent falls. This information is not intended to replace advice given to you by your health care provider. Make sure you discuss any questions you have with your health care provider. Document Released: 06/13/2009 Document Revised: 01/23/2016 Document Reviewed: 09/21/2014 Elsevier Interactive Patient Education  2017 [employer].

## 2019-02-16 LAB — COMPREHENSIVE METABOLIC PANEL
ALT: 16 IU/L (ref 0–32)
AST: 14 IU/L (ref 0–40)
Albumin/Globulin Ratio: 2.1 (ref 1.2–2.2)
Albumin: 4.1 g/dL (ref 3.8–4.8)
Alkaline Phosphatase: 120 IU/L — ABNORMAL HIGH (ref 39–117)
BUN/Creatinine Ratio: 10 — ABNORMAL LOW (ref 12–28)
BUN: 7 mg/dL — ABNORMAL LOW (ref 8–27)
Bilirubin Total: 0.4 mg/dL (ref 0.0–1.2)
CO2: 23 mmol/L (ref 20–29)
Calcium: 8.5 mg/dL — ABNORMAL LOW (ref 8.7–10.3)
Chloride: 99 mmol/L (ref 96–106)
Creatinine, Ser: 0.72 mg/dL (ref 0.57–1.00)
GFR calc Af Amer: 98 mL/min/{1.73_m2} (ref >59)
GFR calc non Af Amer: 85 mL/min/{1.73_m2} (ref >59)
Globulin, Total: 2 g/dL (ref 1.5–4.5)
Glucose: 113 mg/dL — ABNORMAL HIGH (ref 65–99)
Potassium: 3.6 mmol/L (ref 3.5–5.2)
Sodium: 138 mmol/L (ref 134–144)
Total Protein: 6.1 g/dL (ref 6.0–8.5)

## 2019-02-16 LAB — LIPID PANEL W/O CHOL/HDL RATIO
Cholesterol, Total: 166 mg/dL (ref 100–199)
HDL: 36 mg/dL — ABNORMAL LOW (ref >39)
LDL Calculated: 76 mg/dL (ref 0–99)
Triglycerides: 269 mg/dL — ABNORMAL HIGH (ref 0–149)
VLDL Cholesterol Cal: 54 mg/dL — ABNORMAL HIGH (ref 5–40)

## 2019-02-21 ENCOUNTER — Other Ambulatory Visit: Payer: Self-pay

## 2019-02-21 ENCOUNTER — Ambulatory Visit: Payer: Medicare Other | Admitting: Pharmacist

## 2019-02-21 ENCOUNTER — Encounter: Payer: Self-pay | Admitting: Family Medicine

## 2019-02-21 ENCOUNTER — Ambulatory Visit (INDEPENDENT_AMBULATORY_CARE_PROVIDER_SITE_OTHER): Payer: Medicare Other | Admitting: Family Medicine

## 2019-02-21 VITALS — BP 136/90 | HR 86

## 2019-02-21 DIAGNOSIS — J449 Chronic obstructive pulmonary disease, unspecified: Secondary | ICD-10-CM | POA: Diagnosis not present

## 2019-02-21 DIAGNOSIS — I2510 Atherosclerotic heart disease of native coronary artery without angina pectoris: Secondary | ICD-10-CM | POA: Diagnosis not present

## 2019-02-21 DIAGNOSIS — F321 Major depressive disorder, single episode, moderate: Secondary | ICD-10-CM | POA: Diagnosis not present

## 2019-02-21 DIAGNOSIS — N183 Chronic kidney disease, stage 3 unspecified: Secondary | ICD-10-CM

## 2019-02-21 DIAGNOSIS — E782 Mixed hyperlipidemia: Secondary | ICD-10-CM

## 2019-02-21 DIAGNOSIS — E1122 Type 2 diabetes mellitus with diabetic chronic kidney disease: Secondary | ICD-10-CM | POA: Diagnosis not present

## 2019-02-21 DIAGNOSIS — I129 Hypertensive chronic kidney disease with stage 1 through stage 4 chronic kidney disease, or unspecified chronic kidney disease: Secondary | ICD-10-CM | POA: Diagnosis not present

## 2019-02-21 DIAGNOSIS — R0602 Shortness of breath: Secondary | ICD-10-CM

## 2019-02-21 DIAGNOSIS — R197 Diarrhea, unspecified: Secondary | ICD-10-CM | POA: Diagnosis not present

## 2019-02-21 MED ORDER — SERTRALINE HCL 100 MG PO TABS: 100.0000 mg | ORAL_TABLET | Freq: Every day | ORAL | 0 refills | Status: DC

## 2019-02-21 MED ORDER — TRULICITY 1.5 MG/0.5ML ~~LOC~~ SOAJ: 1.5000 mg | SUBCUTANEOUS | 6 refills | Status: DC

## 2019-02-21 MED ORDER — FUROSEMIDE 40 MG PO TABS: 40.0000 mg | ORAL_TABLET | Freq: Every day | ORAL | 1 refills | Status: DC

## 2019-02-21 MED ORDER — ATORVASTATIN CALCIUM 40 MG PO TABS: 40.0000 mg | ORAL_TABLET | Freq: Every day | ORAL | 1 refills | Status: DC

## 2019-02-21 MED ORDER — SPIRIVA HANDIHALER 18 MCG IN CAPS: 18.0000 ug | ORAL_CAPSULE | Freq: Every day | RESPIRATORY_TRACT | 3 refills | Status: DC

## 2019-02-21 MED ORDER — PROAIR HFA 108 (90 BASE) MCG/ACT IN AERS: INHALATION_SPRAY | RESPIRATORY_TRACT | 3 refills | Status: DC

## 2019-02-21 MED ORDER — ISOSORBIDE MONONITRATE ER 60 MG PO TB24: 60.0000 mg | ORAL_TABLET | Freq: Every day | ORAL | 1 refills | Status: DC

## 2019-02-21 MED ORDER — CARVEDILOL 12.5 MG PO TABS: ORAL_TABLET | ORAL | 1 refills | Status: DC

## 2019-02-21 NOTE — Chronic Care Management (AMB) (Signed)
  Chronic Care Management   Follow Up Note   02/21/2019 Name: AKERIA HEDSTROM MRN: [id number] DOB: Feb 15, 1946  Referred by: Valerie Roys, DO Reason for referral : Chronic Care Management (Medication Management)   CHLOE FLIS is a 73 y.o. year old female who is a primary care patient of Valerie Roys, DO. The CCM team was consulted for assistance with chronic disease management and care coordination needs.    Review of patient status, including review of consultants reports, relevant laboratory and other test results, and collaboration with appropriate care team members and the patient's provider was performed as part of comprehensive patient evaluation and provision of chronic care management services.    Goals Addressed            This Visit's Progress     Patient Stated   . "I can't afford my medications" (pt-stated)       Current Barriers:  . Financial Barriers- patient reports that she has reached the Medicare Coverage Gap, and cannot afford to fill Trulicity. Has 1 pen left. Spiriva is also expensive.  . Patient qualifies for Patient Assistance for Trulicity through [place] through [place]):  Marland Kitchen Over the next 30 days, patient will work with PharmD and primary care provider to address needs related to medication access  Interventions: . Received signed provider portion from Dr. Wynetta Emery, signed patient portion, and financial proof of income. Submitting Trulicity to [employer] and Spiriva to [employer]  Patient Self Care Activities:  . Self administers medications as prescribed  Please see past updates related to this goal by clicking on the "Past Updates" button in the selected goal         Plan:  - Will pass applications along to [employer], CPhT for follow up with [employer].   [name], PharmD Clinical Pharmacist [place] [phone]

## 2019-02-21 NOTE — Progress Notes (Signed)
BP 136/90   Pulse 86   SpO2 94%    Subjective:    [name], female    DOB: 1945/10/02, 74 y.o.   MRN: [id number]  HPI: Sarah White is a 73 y.o. female  Chief Complaint  Patient presents with  . Follow-up   DIABETES Hypoglycemic episodes:no Polydipsia/polyuria: yes Visual disturbance: no Chest pain: no Paresthesias: no Glucose Monitoring: yes  Accucheck frequency: Daily  Fasting glucose: 110s-200s, when she forgot your shot Taking Insulin?: no Blood Pressure Monitoring: not checking Retinal Examination: Not up to Date Foot Exam: Up to Date Diabetic Education: Not Completed Pneumovax: Up to Date Influenza: Up to Date Aspirin: yes  HYPERTENSION / HYPERLIPIDEMIA Satisfied with current treatment? yes Duration of hypertension: chronic BP monitoring frequency: not checking BP medication side effects: no Past BP meds: lasix, carvedilol, imdur Duration of hyperlipidemia: chronic Cholesterol medication side effects: no Cholesterol supplements: none Past cholesterol medications: atorvastatin Medication compliance: excellent compliance Aspirin: yes Recent stressors: yes Recurrent headaches: no Visual changes: no Palpitations: no Dyspnea: no Chest pain: no Lower extremity edema: no Dizzy/lightheaded: no  DEPRESSION Mood status: stable Satisfied with current treatment?: yes Symptom severity: mild  Duration of current treatment : chronic Side effects: no Medication compliance: excellent compliance Psychotherapy/counseling: no  Previous psychiatric medications: zoloft Depressed mood: no Anxious mood: no Anhedonia: no Significant weight loss or gain: no Insomnia: no - does well on her trazodone Fatigue: no Feelings of worthlessness or guilt: no Impaired concentration/indecisiveness: no Suicidal ideations: no Hopelessness: no Crying spells: no Depression screen [facility] 2/9 02/15/2019 05/05/2018 03/24/2018 09/30/2017 01/01/2017  Decreased Interest 0  0 2 1 0  Down, Depressed, Hopeless 0 0 2 0 0  PHQ - 2 Score 0 0 4 1 0  Altered sleeping - 3 2 1  0  Tired, decreased energy - 1 2 1 2   Change in appetite - 1 2 0 0  Feeling bad or failure about yourself  - 0 2 1 0  Trouble concentrating - 0 2 0 1  Moving slowly or fidgety/restless - 0 0 0 0  Suicidal thoughts - 0 0 0 0  PHQ-9 Score - 5 14 4 3   Difficult doing work/chores - Somewhat difficult Somewhat difficult Not difficult at all -    Relevant past medical, surgical, family and social history reviewed and updated as indicated. Interim medical history since our last visit reviewed. Allergies and medications reviewed and updated.  Review of Systems  Constitutional: Negative.   Respiratory: Negative.   Cardiovascular: Negative.   Gastrointestinal: Positive for abdominal distention, abdominal pain and diarrhea. Negative for anal bleeding, blood in stool, constipation, nausea, rectal pain and vomiting.  Skin: Negative.   Neurological: Negative.   Psychiatric/Behavioral: Negative.     Per HPI unless specifically indicated above     Objective:    BP 136/90   Pulse 86   SpO2 94%   Wt Readings from Last 3 Encounters:  02/15/19 184 lb 12.8 oz (83.8 kg)  09/20/18 188 lb 12.8 oz (85.6 kg)  09/09/18 205 lb 14.6 oz (93.4 kg)    Physical Exam Vitals signs and nursing note reviewed.  Pulmonary:     Effort: Pulmonary effort is normal. No respiratory distress.     Comments: Speaking in full sentences Neurological:     Mental Status: She is alert.  Psychiatric:        Mood and Affect: Mood normal.        Behavior: Behavior normal.  Thought Content: Thought content normal.        Judgment: Judgment normal.     Results for orders placed or performed in visit on 02/15/19  Microalbumin, Urine Waived  Result Value Ref Range   Microalb, Ur Waived 10 0 - 19 mg/L   Creatinine, Urine Waived 50 10 - 300 mg/dL   Microalb/Creat Ratio 30-300 (H) <30 mg/g  Lipid Panel w/o Chol/HDL  Ratio  Result Value Ref Range   Cholesterol, Total 166 100 - 199 mg/dL   Triglycerides 269 (H) 0 - 149 mg/dL   HDL 36 (L) >39 mg/dL   VLDL Cholesterol Cal 54 (H) 5 - 40 mg/dL   LDL Calculated 76 0 - 99 mg/dL  Comprehensive metabolic panel  Result Value Ref Range   Glucose 113 (H) 65 - 99 mg/dL   BUN 7 (L) 8 - 27 mg/dL   Creatinine, Ser 0.72 0.57 - 1.00 mg/dL   GFR calc non Af Amer 85 >59 mL/min/1.73   GFR calc Af Amer 98 >59 mL/min/1.73   BUN/Creatinine Ratio 10 (L) 12 - 28   Sodium 138 134 - 144 mmol/L   Potassium 3.6 3.5 - 5.2 mmol/L   Chloride 99 96 - 106 mmol/L   CO2 23 20 - 29 mmol/L   Calcium 8.5 (L) 8.7 - 10.3 mg/dL   Total Protein 6.1 6.0 - 8.5 g/dL   Albumin 4.1 3.8 - 4.8 g/dL   Globulin, Total 2.0 1.5 - 4.5 g/dL   Albumin/Globulin Ratio 2.1 1.2 - 2.2   Bilirubin Total 0.4 0.0 - 1.2 mg/dL   Alkaline Phosphatase 120 (H) 39 - 117 IU/L   AST 14 0 - 40 IU/L   ALT 16 0 - 32 IU/L  Bayer DCA Hb A1c Waived  Result Value Ref Range   HB A1C (BAYER DCA - WAIVED) 7.9 (H) <7.0 %      Assessment & Plan:   Problem List Items Addressed This Visit      Cardiovascular and Mediastinum   CAD (coronary artery disease)    Under good control on current regimen. Continue current regimen. Continue to monitor. Call with any concerns. Refills given. Labs drawn last week.        Relevant Medications   atorvastatin (LIPITOR) 40 MG tablet   carvedilol (COREG) 12.5 MG tablet   furosemide (LASIX) 40 MG tablet   isosorbide mononitrate (IMDUR) 60 MG 24 hr tablet     Respiratory   COPD (chronic obstructive pulmonary disease) (HCC)    Under good control on current regimen. Continue current regimen. Continue to monitor. Call with any concerns. Refills given. Labs drawn last week. .       Relevant Medications   PROAIR HFA 108 (90 Base) MCG/ACT inhaler   tiotropium (SPIRIVA HANDIHALER) 18 MCG inhalation capsule     Endocrine   Diabetes mellitus with chronic kidney disease ([place]) - Primary     Doing better with A1c down to 7.9 from 9.5- continue current regimen. Continue to monitor. Call with any concerns. Recheck 3 months.       Relevant Medications   atorvastatin (LIPITOR) 40 MG tablet   Dulaglutide (TRULICITY) 1.5 DX/4.1OI SOPN     Genitourinary   Hypertensive renal disease    Under good control on current regimen. Continue current regimen. Continue to monitor. Call with any concerns. Refills given. Labs drawn last week.         Other   Hyperlipidemia    Under good control on current regimen.  Continue current regimen. Continue to monitor. Call with any concerns. Refills given. Labs drawn last week.       Relevant Medications   atorvastatin (LIPITOR) 40 MG tablet   carvedilol (COREG) 12.5 MG tablet   furosemide (LASIX) 40 MG tablet   isosorbide mononitrate (IMDUR) 60 MG 24 hr tablet   Depression, major, single episode, moderate (HCC)    Under good control on current regimen. Continue current regimen. Continue to monitor. Call with any concerns. Refills given. Labs drawn last week.       Relevant Medications   sertraline (ZOLOFT) 100 MG tablet    Other Visit Diagnoses    Diarrhea, unspecified type       Started having a lot of diarrhea- will get stool studies. Call if not getting better or getting worse.    Relevant Orders   Ova and parasite examination   Stool C-Diff Toxin Assay   Fecal leukocytes   Stool Culture       Follow up plan: Return in about 4 weeks (around 03/21/2019).   . This visit was completed via telephone due to the restrictions of the COVID-19 pandemic. All issues as above were discussed and addressed but no physical exam was performed. If it was felt that the patient should be evaluated in the office, they were directed there. The patient verbally consented to this visit. Patient was unable to complete an audio/visual visit due to Lack of equipment. Due to the catastrophic nature of the COVID-19 pandemic, this visit was done through audio  contact only. . Location of the patient: home . Location of the provider: work . Those involved with this call:  . Provider: Park Liter, DO . CMA: Gerda Diss, CMA . Front Desk/Registration: Don Perking  . Time spent on call: 25 minutes on the phone discussing health concerns. 40 minutes total spent in review of patient's record and preparation of their chart.

## 2019-02-21 NOTE — Chronic Care Management (AMB) (Signed)
  Chronic Care Management   Follow Up Note   02/21/2019 Name: Sarah White MRN: [id number] DOB: Feb 28, 1946  Referred by: Valerie Roys, DO Reason for referral : Chronic Care Management (Medication Management)   Sarah White is a 73 y.o. year old female who is a primary care patient of Valerie Roys, DO. The CCM team was consulted for assistance with chronic disease management and care coordination needs.    Care coordination completed today  Review of patient status, including review of consultants reports, relevant laboratory and other test results, and collaboration with appropriate care team members and the patient's provider was performed as part of comprehensive patient evaluation and provision of chronic care management services.    Goals Addressed            This Visit's Progress     Patient Stated   . "I can't afford my medications" (pt-stated)       Current Barriers:  . Financial Barriers- patient reports that she has reached the Medicare Coverage Gap, and cannot afford to fill Trulicity. Has 1 pen left. Spiriva is also expensive.  . Patient qualifies for Patient Assistance for Trulicity through [place] through [place]):  Marland Kitchen Over the next 30 days, patient will work with PharmD and primary care provider to address needs related to medication access  Interventions: . Received signed provider portion from Dr. Wynetta Emery . Contacted patient to f/u on if she has brought her 2020 proof of income- left message asking her to return my call .   Patient Self Care Activities:  . Self administers medications as prescribed  Please see past updates related to this goal by clicking on the "Past Updates" button in the selected goal          Plan:  - PharmD will follow up with patient in 1 week regarding status of application  [name], PharmD Clinical Pharmacist [place]  [phone]

## 2019-02-21 NOTE — Patient Instructions (Addendum)
Visit Information  Goals Addressed            This Visit's Progress     Patient Stated   . "I can't afford my medications" (pt-stated)       Current Barriers:  . Financial Barriers- patient reports that she has reached the Medicare Coverage Gap, and cannot afford to fill Trulicity. Has 1 pen left. Spiriva is also expensive.  . Patient qualifies for Patient Assistance for Trulicity through [place] through [place]):  Marland Kitchen Over the next 30 days, patient will work with PharmD and primary care provider to address needs related to medication access  Interventions: . Received signed provider portion from Dr. Wynetta Emery, signed patient portion, and financial proof of income. Submitting Trulicity to [employer] and Spiriva to [employer]  Patient Self Care Activities:  . Self administers medications as prescribed  Please see past updates related to this goal by clicking on the "Past Updates" button in the selected goal         The patient verbalized understanding of instructions provided today and declined a print copy of patient instruction materials.   Plan:  - Will pass applications along to [employer], CPhT for follow up with [place].   [name], PharmD Clinical Pharmacist [place] [phone]

## 2019-02-22 ENCOUNTER — Telehealth: Payer: Self-pay

## 2019-02-22 NOTE — Telephone Encounter (Signed)
Copied from [place]. Topic: Referral - Status >> Feb 22, 2019  4:25 PM Simone Curia D wrote: 01/23/8947 Spoke with patient about eye clinic at the [facility]. MA

## 2019-02-25 ENCOUNTER — Encounter: Payer: Self-pay | Admitting: Family Medicine

## 2019-02-25 NOTE — Assessment & Plan Note (Addendum)
Under good control on current regimen. Continue current regimen. Continue to monitor. Call with any concerns. Refills given. Labs drawn last week.

## 2019-02-25 NOTE — Assessment & Plan Note (Addendum)
Doing better with A1c down to 7.9 from 9.5- continue current regimen. Continue to monitor. Call with any concerns. Recheck 3 months.

## 2019-02-25 NOTE — Assessment & Plan Note (Addendum)
Under good control on current regimen. Continue current regimen. Continue to monitor. Call with any concerns. Refills given. Labs drawn last week. Marland Kitchen

## 2019-02-28 ENCOUNTER — Telehealth: Payer: Self-pay

## 2019-03-06 ENCOUNTER — Other Ambulatory Visit: Payer: Self-pay | Admitting: Pharmacy Technician

## 2019-03-06 NOTE — Patient Outreach (Signed)
[place] [facility]) Care Management  03/06/2019  EZELLE SURPRENANT 12/19/1945 [id number]   ADDENDUM  Care coordination call placed to [place] in regards to [place] application for Trulicity.  Spoke to [place] who said the medication was ready for shipment. She informed they would outreach patient but that patient could call in as well.  Will followup with patient in 7-10 business days to inquire if medication was received.  Lawson Isabell P. Claira Jeter, [place] Management [phone]

## 2019-03-06 NOTE — Patient Outreach (Signed)
[facility]) Care Management  03/06/2019  XENA PROPST [birth year] [id number]    Care coordination call placed to [place] in regards to patient's application for Trulicity.  Spoke to [place] who informed patient had been APPROVED 02/24/2019-08/31/2019. Dyann Ruddle informed the order was sent to [employer] but the shipment is pending. She informed to followup with [facility].  Care coordination call placed to BI in regards to patient's Spiriva application.   Spoke to [place] who informed patient was over income. She informed an appeal could be submitted. The patient would need to submit a price check of the medication making sure the letter included the patient's name and dob.  Will outreach [place] with assistance.  Niraj Kudrna P. Akeel Reffner, [place] Management [phone]

## 2019-03-07 ENCOUNTER — Ambulatory Visit: Payer: Medicare Other | Admitting: Pharmacist

## 2019-03-07 DIAGNOSIS — E1122 Type 2 diabetes mellitus with diabetic chronic kidney disease: Secondary | ICD-10-CM

## 2019-03-07 DIAGNOSIS — N183 Chronic kidney disease, stage 3 unspecified: Secondary | ICD-10-CM

## 2019-03-07 DIAGNOSIS — R0602 Shortness of breath: Secondary | ICD-10-CM

## 2019-03-07 NOTE — Patient Instructions (Signed)
Visit Information  Goals Addressed            This Visit's Progress     Patient Stated   . "I can't afford my medications" (pt-stated)       Current Barriers:  . Financial Barriers- patient reports that she has reached the Medicare Coverage Gap, and cannot afford to fill brand name medications . Received message from Susy Frizzle, patient APPROVED for Trulicity patient assistance through [employer]. [facility] were going to outreach the patient this week to set up shipping . Patient over income for [place] assistance for Spiriva, but we can submit an appeals  Pharmacist Clinical Goal(s):  Marland Kitchen Over the next 30 days, patient will work with PharmD and primary care provider to address needs related to medication access  Interventions: . Contacted patient, left voicemail asking her to call me back to discuss needs for BI appeal. We will need proof of her copayment for Spiriva - she can get this as a print out from her pharmacy, and bring it by clinic to be faxed in to [employer] . Created letter of appeal. Have placed at the front desk for patient to come by and sign.   Patient Self Care Activities:  . Self administers medications as prescribed  Please see past updates related to this goal by clicking on the "Past Updates" button in the selected goal         The patient verbalized understanding of instructions provided today and declined a print copy of patient instruction materials.    Plan:  - Will follow up with patient later this week regarding updates on patient assistance  [name], PharmD Clinical Pharmacist [place] [phone]

## 2019-03-07 NOTE — Chronic Care Management (AMB) (Signed)
  Chronic Care Management   Follow Up Note   03/07/2019 Name: Sarah White MRN: [id number] DOB: Oct 19, 1945  Referred by: Valerie Roys, DO Reason for referral : Chronic Care Management (Medication Management)   Sarah White is a 73 y.o. year old female who is a primary care patient of Valerie Roys, DO. The CCM team was consulted for assistance with chronic disease management and care coordination needs.    Care coordination completed today.   Review of patient status, including review of consultants reports, relevant laboratory and other test results, and collaboration with appropriate care team members and the patient's provider was performed as part of comprehensive patient evaluation and provision of chronic care management services.    Goals Addressed            This Visit's Progress     Patient Stated   . "I can't afford my medications" (pt-stated)       Current Barriers:  . Financial Barriers- patient reports that she has reached the Medicare Coverage Gap, and cannot afford to fill brand name medications . Received message from Susy Frizzle, patient APPROVED for Trulicity patient assistance through [employer]. [facility] were going to outreach the patient this week to set up shipping . Patient over income for [place] assistance for Spiriva, but we can submit an appeals  Pharmacist Clinical Goal(s):  Marland Kitchen Over the next 30 days, patient will work with PharmD and primary care provider to address needs related to medication access  Interventions: . Contacted patient, left voicemail asking her to call me back to discuss needs for BI appeal. We will need proof of her copayment for Spiriva - she can get this as a print out from her pharmacy, and bring it by clinic to be faxed in to [employer] . Created letter of appeal. Have placed at the front desk for patient to come by and sign.   Patient Self Care Activities:  . Self administers  medications as prescribed  Please see past updates related to this goal by clicking on the "Past Updates" button in the selected goal          Plan:  - Will follow up with patient later this week regarding updates on patient assistance  [name], PharmD Clinical Pharmacist [place] [phone]

## 2019-03-09 ENCOUNTER — Other Ambulatory Visit: Payer: Self-pay | Admitting: Pharmacy Technician

## 2019-03-09 NOTE — Patient Outreach (Signed)
[place] [facility]) Care Management  03/09/2019  Sarah White [birth year] [id number]    Care coordination call placed to [place] in regards to patient's application for Trulicity.  Spoke to [facility] who informed the Trulicity is scheduled for delivery on 03/10/2019.  Will followup with patient in 3-5 business days to confirm receipt of medication.  Takya Vandivier P. Rosangelica Pevehouse, [place] Management [phone]

## 2019-03-16 ENCOUNTER — Other Ambulatory Visit: Payer: Self-pay | Admitting: Pharmacy Technician

## 2019-03-16 NOTE — Patient Outreach (Signed)
[place] [facility]) Care Management  03/16/2019  Sarah White [birth year] [id number]    Successful outreach call placed to patient in regards to [place] patient assistance for Trulicity.  Spoke to patient, HIPAA identifiers verified.  Patient informed she received 4 boxes of Trulicity. Discussed refill procedure with patient and she verbalized understanding.  Patient informed she has not been to [facility] to pick up the printout needed to appeal the BI decision for Spiriva because she is"scared of catching COVID19 due to my risk factors". Patient informed [facility] refuses to release the information to her son even though he picks up her prescriptions. They also will not fax the document per the patient. Informed patient she may need to call back and speak to the manager of the pharmacy and express her concerns. Patient informed she would do that.  Will route note to embedded [facility] [name] of the above information and will continue with appeals process once the required documentation is received.  Tressia Labrum P. Saabir Blyth, [place] Management [phone]

## 2019-03-16 NOTE — Patient Outreach (Signed)
[place] [facility]) Care Management  03/16/2019  KATILIN RAYNES [birth year] [id number]    Unsuccessful outreach call placed to patient in regards to [place] application for Trulicity.  Unfortunately patient did not answer the phone, HIPAA compliant voicemail left.  Will followup with patient in 3-7 business days if call is not returned.  Jash Wahlen P. Laszlo Ellerby, [place] Management [phone]

## 2019-03-20 ENCOUNTER — Telehealth: Payer: Self-pay

## 2019-03-20 NOTE — Telephone Encounter (Signed)
Copied from [place] [phone]. Topic: Referral - Status >> Mar 20, 2019 33:38 PM Simone Curia D wrote: 11/27/1914 Left message on voicemail to see if patient was able to get an appointment  at [facility] [place]. Ambrose Mantle [phone]

## 2019-03-24 ENCOUNTER — Telehealth: Payer: Self-pay

## 2019-03-24 NOTE — Telephone Encounter (Signed)
Copied from [place] [id number]. Topic: Referral - Status >> Mar 24, 2019  7:44 PM Simone Curia D wrote: 01/11/6046 Spoke with patient she was able to contact the [facility] and is waiting for them to call her and schedule.  Patient stated she has my name and number and will call me when she is scheduled. Ambrose Mantle [phone]

## 2019-03-31 ENCOUNTER — Telehealth: Payer: Self-pay

## 2019-03-31 ENCOUNTER — Ambulatory Visit: Payer: Medicare Other | Admitting: Pharmacist

## 2019-03-31 NOTE — Chronic Care Management (AMB) (Signed)
  Chronic Care Management   Note  03/31/2019 Name: Sarah White MRN: [id number] DOB: 1946/04/08  Sarah White is a 73 y.o. year old female who is a primary care patient of Valerie Roys, DO. The CCM team was consulted for assistance with chronic disease management and care coordination needs.    Contacted patient to follow up on status of getting copay report for Spiriva from [place]. Left HIPAA compliant message for her to return my call at her convenience.   Follow up plan: - Will outreach again in 1-2 weeks if I do not hear back  [name], PharmD Clinical Pharmacist [place] [phone]

## 2019-04-13 ENCOUNTER — Ambulatory Visit: Payer: Self-pay | Admitting: Pharmacist

## 2019-04-13 NOTE — Chronic Care Management (AMB) (Signed)
  Chronic Care Management   Note  04/13/2019 Name: Sarah White MRN: [id number] DOB: 10-14-1945  Sarah White is a 73 y.o. year old female who is a primary care patient of Valerie Roys, DO. The CCM team was consulted for assistance with chronic disease management and care coordination needs.    Attempted to contact patient to follow up on medication management, and if she was able to get the proof of copay from her pharmacy. Left HIPAA compliant message for her to return my call at her convenience.   Follow up plan: - If I do not hear back, will outreach again in the next 2-4 weeks  [name], PharmD Clinical Pharmacist [place] [phone]

## 2019-04-14 ENCOUNTER — Telehealth: Payer: Self-pay

## 2019-04-28 ENCOUNTER — Ambulatory Visit
Admission: RE | Admit: 2019-04-28 | Discharge: 2019-04-28 | Disposition: A | Discharge status: Home / Self Care | Payer: Medicare Other | Source: Ambulatory Visit | Attending: Family Medicine | Admitting: Family Medicine

## 2019-04-28 ENCOUNTER — Telehealth: Payer: Self-pay

## 2019-04-28 ENCOUNTER — Other Ambulatory Visit: Payer: Self-pay

## 2019-04-28 ENCOUNTER — Encounter: Payer: Self-pay | Admitting: Family Medicine

## 2019-04-28 ENCOUNTER — Ambulatory Visit (INDEPENDENT_AMBULATORY_CARE_PROVIDER_SITE_OTHER): Payer: Medicare Other | Admitting: Family Medicine

## 2019-04-28 ENCOUNTER — Ambulatory Visit (INDEPENDENT_AMBULATORY_CARE_PROVIDER_SITE_OTHER): Payer: Medicare Other | Admitting: Pharmacist

## 2019-04-28 ENCOUNTER — Ambulatory Visit
Admission: RE | Admit: 2019-04-28 | Discharge: 2019-04-28 | Disposition: A | Discharge status: Home / Self Care | Payer: Medicare Other | Attending: Family Medicine | Admitting: Family Medicine

## 2019-04-28 VITALS — BP 129/78 | HR 83 | Temp 98.9°F

## 2019-04-28 DIAGNOSIS — N183 Chronic kidney disease, stage 3 unspecified: Secondary | ICD-10-CM

## 2019-04-28 DIAGNOSIS — M545 Low back pain, unspecified: Secondary | ICD-10-CM

## 2019-04-28 DIAGNOSIS — Z23 Encounter for immunization: Secondary | ICD-10-CM

## 2019-04-28 DIAGNOSIS — Z599 Problem related to housing and economic circumstances, unspecified: Secondary | ICD-10-CM

## 2019-04-28 DIAGNOSIS — Z598 Other problems related to housing and economic circumstances: Secondary | ICD-10-CM

## 2019-04-28 DIAGNOSIS — E1122 Type 2 diabetes mellitus with diabetic chronic kidney disease: Secondary | ICD-10-CM | POA: Diagnosis not present

## 2019-04-28 DIAGNOSIS — I2510 Atherosclerotic heart disease of native coronary artery without angina pectoris: Secondary | ICD-10-CM | POA: Diagnosis not present

## 2019-04-28 LAB — UA/M W/RFLX CULTURE, ROUTINE
Bilirubin, UA: NEGATIVE
Glucose, UA: NEGATIVE
Ketones, UA: NEGATIVE
Leukocytes,UA: NEGATIVE
Nitrite, UA: NEGATIVE
Protein,UA: NEGATIVE
RBC, UA: NEGATIVE
Specific Gravity, UA: <1.005 — ABNORMAL LOW (ref 1.005–1.030)
Urobilinogen, Ur: 0.2 mg/dL (ref 0.2–1.0)
pH, UA: 5 (ref 5.0–7.5)

## 2019-04-28 MED ORDER — NAPROXEN 500 MG PO TABS: 500.0000 mg | ORAL_TABLET | Freq: Two times a day (BID) | ORAL | 0 refills | Status: DC

## 2019-04-28 MED ORDER — CYCLOBENZAPRINE HCL 10 MG PO TABS: 10.0000 mg | ORAL_TABLET | Freq: Three times a day (TID) | ORAL | 0 refills | Status: DC | PRN

## 2019-04-28 NOTE — Chronic Care Management (AMB) (Signed)
Chronic Care Management   Follow Up Note   04/28/2019 Name: Sarah White MRN: [id number] DOB: [birth year]  Referred by: Valerie Roys, DO Reason for referral : Chronic Care Management (Medication Management)   Sarah White is a 73 y.o. year old female who is a primary care patient of Valerie Roys, DO. The CCM team was consulted for assistance with chronic disease management and care coordination needs.    Contacted patient for medication management review today.   Review of patient status, including review of consultants reports, relevant laboratory and other test results, and collaboration with appropriate care team members and the patient's provider was performed as part of comprehensive patient evaluation and provision of chronic care management services.    SDOH (Social Determinants of Health) screening performed today: Financial Strain  Tobacco Use. See Care Plan for related entries.   Outpatient Encounter Medications as of 04/28/2019  Medication Sig Note  . aspirin EC 81 MG tablet Take 81 mg by mouth at bedtime.    Marland Kitchen atorvastatin (LIPITOR) 40 MG tablet Take 1 tablet (40 mg total) by mouth daily.   . carvedilol (COREG) 12.5 MG tablet TAKE 2 TABLETS BY MOUTH TWICE DAILY WITH A MEAL   . Dulaglutide (TRULICITY) 1.5 0000000 SOPN Inject 1.5 mg into the skin once a week.   . furosemide (LASIX) 40 MG tablet Take 1 tablet (40 mg total) by mouth daily. 04/28/2019: QOD  . isosorbide mononitrate (IMDUR) 60 MG 24 hr tablet Take 1 tablet (60 mg total) by mouth daily.   Marland Kitchen PROAIR HFA 108 (90 Base) MCG/ACT inhaler INHALE 1 TO 2 PUFFS BY MOUTH EVERY 4 HOURS AS NEEDED FOR WHEEZING OR SHORTNESS OF BREATH   . sertraline (ZOLOFT) 100 MG tablet Take 1 tablet (100 mg total) by mouth daily.   Marland Kitchen tiotropium (SPIRIVA HANDIHALER) 18 MCG inhalation capsule Place 1 capsule (18 mcg total) into inhaler and inhale daily.   . traZODone (DESYREL) 50 MG tablet TAKE 1/2 TO 1 (ONE-HALF TO ONE) TABLET BY MOUTH  AT BEDTIME AS NEEDED FOR SLEEP   . cyclobenzaprine (FLEXERIL) 10 MG tablet Take 1 tablet (10 mg total) by mouth 3 (three) times daily as needed for muscle spasms. DO NOT DRIVE ON THIS MEDICINE- WILL MAKE YOU SLEEPY   . diphenhydrAMINE (BENADRYL) 25 MG tablet Take 25 mg by mouth every 6 (six) hours as needed for allergies.   . naproxen (NAPROSYN) 500 MG tablet Take 1 tablet (500 mg total) by mouth 2 (two) times daily with a meal.   . nitroGLYCERIN (NITROSTAT) 0.4 MG SL tablet Place 1 tablet (0.4 mg total) under the tongue every 5 (five) minutes as needed for chest pain. (Patient not taking: Reported on 04/28/2019)   . ondansetron (ZOFRAN-ODT) 4 MG disintegrating tablet Take 4 mg by mouth every 8 (eight) hours as needed for nausea or vomiting.    No facility-administered encounter medications on file as of 04/28/2019.      Goals Addressed            This Visit's Progress     Patient Stated   . "I can't afford my medications" (pt-stated)       Current Barriers:  . Financial Barriers- patient in Medicare Coverage Gap, and brand name medications are expensive . APPROVED for [employer] patient assistance through 08/31/2019. Patient received Trulicity, notes sugars are much improved . Denied for [employer] assistance for [employer]. She would likely be approved through the appeals process, however, it requires  proof of the cost of Spiriva from her pharmacy. She notes that [facility] policy prevents them from releasing this to anyone but her.   Pharmacist Clinical Goal(s):  Marland Kitchen Over the next 60 days, patient will work with PharmD and primary care provider to address needs related to medication access  Interventions: . Patient will contact office when she is due for refill of Trulicity . Patient notes that she fears going into [facility] now d/t pandemic, and is able to pay the cost of Spiriva now that the Trulicity is free. Notes she uses an incentive spirometer prior to dosing Spiriva, and she  rarely needs to use albuterol HFA.  . Denies any other cost needs at this time.   Patient Self Care Activities:  . Self administers medications as prescribed  Please see past updates related to this goal by clicking on the "Past Updates" button in the selected goal      . "I want to keep my sugars under control" (pt-stated)       Current Barriers:  . Diabetes: uncontrolled; most recent A1c 7.9%  o Recent occurrence of significant back pain, seen today by Dr. Wynetta Emery. Imaging pending. Given naproxen and cyclobenzaprine . Current antihyperglycemic regimen: Trulicity 1.5 mg weekly  . Reports 1 episode of hypoglycemia to 50s; notes she hadn't eaten recently.  . Current blood glucose readings: 90-170s random (patient notes she doesn't remember to check in any regular pattern) . Cardiovascular risk reduction: o Current hypertensive regimen: carvedilol 12.5 mg BID o Current hyperlipidemia regimen: atorvastatin 40 mg daily; LDL not quite at goal <70 given DM and smoking   Pharmacist Clinical Goal(s):  Marland Kitchen Over the next 90 days, patient with work with PharmD and primary care provider to address optimized medication management  Interventions: . Comprehensive medication review performed, medication list updated in electronic medical record . Congratulated patient on blood sugars. Reiterated goal A1c, fasting sugar results.  . Educated that acute pain can elevate sugar levels. Reiterated to no combine naproxen and ibuprofen, and to be cautious with cyclobenzaprine, trazodone, and ibuprofen PM, until she determines how sleepy cyclobenzaprine makes her. She verbalized understanding . Medication history reviewed; patient up to date on fills for atorvastatin.   Patient Self Care Activities:  . Patient will check blood glucose daily , document, and provide at future appointments . Patient will take medications as prescribed . Patient will report any questions or concerns to provider   Please see past  updates related to this goal by clicking on the "Past Updates" button in the selected goal          Plan:  - Will outreach patient in 4-6 weeks for continued medication management support  [name], PharmD Clinical Pharmacist [place] [phone]

## 2019-04-28 NOTE — Patient Instructions (Signed)
Visit Information  Goals Addressed            This Visit's Progress     Patient Stated   . "I can't afford my medications" (pt-stated)       Current Barriers:  . Financial Barriers- patient in Medicare Coverage Gap, and brand name medications are expensive . APPROVED for [employer] patient assistance through 08/31/2019. Patient received Trulicity, notes sugars are much improved . Denied for [employer] assistance for [employer]. She would likely be approved through the appeals process, however, it requires proof of the cost of Spiriva from her pharmacy. She notes that [facility] policy prevents them from releasing this to anyone but her.   Pharmacist Clinical Goal(s):  Marland Kitchen Over the next 60 days, patient will work with PharmD and primary care provider to address needs related to medication access  Interventions: . Patient will contact office when she is due for refill of Trulicity . Patient notes that she fears going into [facility] now d/t pandemic, and is able to pay the cost of Spiriva now that the Trulicity is free. Notes she uses an incentive spirometer prior to dosing Spiriva, and she rarely needs to use albuterol HFA.  . Denies any other cost needs at this time.   Patient Self Care Activities:  . Self administers medications as prescribed  Please see past updates related to this goal by clicking on the "Past Updates" button in the selected goal      . "I want to keep my sugars under control" (pt-stated)       Current Barriers:  . Diabetes: uncontrolled; most recent A1c 7.9%  o Recent occurrence of significant back pain, seen today by Dr. Wynetta Emery. Imaging pending. Given naproxen and cyclobenzaprine . Current antihyperglycemic regimen: Trulicity 1.5 mg weekly  . Reports 1 episode of hypoglycemia to 50s; notes she hadn't eaten recently.  . Current blood glucose readings: 90-170s random (patient notes she doesn't remember to check in any regular pattern) . Cardiovascular risk  reduction: o Current hypertensive regimen: carvedilol 12.5 mg BID o Current hyperlipidemia regimen: atorvastatin 40 mg daily; LDL not quite at goal <70 given DM and smoking   Pharmacist Clinical Goal(s):  Marland Kitchen Over the next 90 days, patient with work with PharmD and primary care provider to address optimized medication management  Interventions: . Comprehensive medication review performed, medication list updated in electronic medical record . Congratulated patient on blood sugars. Reiterated goal A1c, fasting sugar results.  . Educated that acute pain can elevate sugar levels. Reiterated to no combine naproxen and ibuprofen, and to be cautious with cyclobenzaprine, trazodone, and ibuprofen PM, until she determines how sleepy cyclobenzaprine makes her. She verbalized understanding . Medication history reviewed; patient up to date on fills for atorvastatin.   Patient Self Care Activities:  . Patient will check blood glucose daily , document, and provide at future appointments . Patient will take medications as prescribed . Patient will report any questions or concerns to provider   Please see past updates related to this goal by clicking on the "Past Updates" button in the selected goal         The patient verbalized understanding of instructions provided today and declined a print copy of patient instruction materials.    Plan:  - Will outreach patient in 4-6 weeks for continued medication management support  [name], PharmD Clinical Pharmacist [place] [phone]

## 2019-04-28 NOTE — Patient Instructions (Signed)

## 2019-04-28 NOTE — Progress Notes (Signed)
BP 129/78    Pulse 83    Temp 98.9 F (37.2 C)    SpO2 94%    Subjective:    [name], female    DOB: 03/13/1946, 73 y.o.   MRN: [id number]  HPI: Sarah White is a 73 y.o. female  Chief Complaint  Patient presents with   Back Pain   BACK PAIN Duration: 1 week Mechanism of injury: no trauma Location: midline and low back Onset: sudden Severity: severe Quality: spasm, severe, "pain" Frequency: constant Radiation: occasionally into her side Aggravating factors: nothing Alleviating factors: nothing Status: worse Treatments attempted: rest, ice, heat, APAP, ibuprofen and aleve , lidocaine Relief with NSAIDs?: no Nighttime pain:  yes Paresthesias / decreased sensation:  no Bowel / bladder incontinence:  no Fevers:  no Dysuria / urinary frequency:  no  Relevant past medical, surgical, family and social history reviewed and updated as indicated. Interim medical history since our last visit reviewed. Allergies and medications reviewed and updated.  Review of Systems  Constitutional: Negative.   Respiratory: Negative.   Cardiovascular: Negative.   Genitourinary: Negative.   Musculoskeletal: Positive for back pain, gait problem and myalgias. Negative for arthralgias, joint swelling, neck pain and neck stiffness.  Skin: Negative.   Neurological: Negative for dizziness, tremors, seizures, syncope, facial asymmetry, speech difficulty, weakness, light-headedness, numbness and headaches.  Psychiatric/Behavioral: Negative.     Per HPI unless specifically indicated above     Objective:    BP 129/78    Pulse 83    Temp 98.9 F (37.2 C)    SpO2 94%   Wt Readings from Last 3 Encounters:  02/15/19 184 lb 12.8 oz (83.8 kg)  09/20/18 188 lb 12.8 oz (85.6 kg)  09/09/18 205 lb 14.6 oz (93.4 kg)    Physical Exam Vitals signs and nursing note reviewed.  Constitutional:      General: She is not in acute distress.    Appearance: Normal appearance. She is not  ill-appearing, toxic-appearing or diaphoretic.  HENT:     Head: Normocephalic and atraumatic.     Right Ear: External ear normal.     Left Ear: External ear normal.     Nose: Nose normal.     Mouth/Throat:     Mouth: Mucous membranes are moist.     Pharynx: Oropharynx is clear.  Eyes:     General: No scleral icterus.       Right eye: No discharge.        Left eye: No discharge.     Extraocular Movements: Extraocular movements intact.     Conjunctiva/sclera: Conjunctivae normal.     Pupils: Pupils are equal, round, and reactive to light.  Neck:     Musculoskeletal: Normal range of motion and neck supple.  Cardiovascular:     Rate and Rhythm: Normal rate and regular rhythm.     Pulses: Normal pulses.     Heart sounds: Normal heart sounds. No murmur. No friction rub. No gallop.   Pulmonary:     Effort: Pulmonary effort is normal. No respiratory distress.     Breath sounds: Normal breath sounds. No stridor. No wheezing, rhonchi or rales.  Chest:     Chest wall: No tenderness.  Musculoskeletal:     Comments: Back Exam:    Inspection:  Normal spinal curvature.  No deformity, ecchymosis, erythema, or lesions     Palpation:     Midline spinal tenderness: yes lumbar    Paralumbar tenderness: yes  Range of Motion:      Flexion: Fingers to Knees     Extension:Decreased     Lateral bending:Decreased    Rotation:Decreased    Neuro Exam:Lower extremity DTRs normal & symmetric.  Strength and sensation intact.   Special Tests:      Straight leg raise:negative   Skin:    General: Skin is warm and dry.     Capillary Refill: Capillary refill takes less than 2 seconds.     Coloration: Skin is not jaundiced or pale.     Findings: No bruising, erythema, lesion or rash.  Neurological:     General: No focal deficit present.     Mental Status: She is alert and oriented to person, place, and time. Mental status is at baseline.  Psychiatric:        Mood and Affect: Mood normal.         Behavior: Behavior normal.        Thought Content: Thought content normal.        Judgment: Judgment normal.     Results for orders placed or performed in visit on 02/15/19  Microalbumin, Urine Waived  Result Value Ref Range   Microalb, Ur Waived 10 0 - 19 mg/L   Creatinine, Urine Waived 50 10 - 300 mg/dL   Microalb/Creat Ratio 30-300 (H) <30 mg/g  Lipid Panel w/o Chol/HDL Ratio  Result Value Ref Range   Cholesterol, Total 166 100 - 199 mg/dL   Triglycerides 269 (H) 0 - 149 mg/dL   HDL 36 (L) >39 mg/dL   VLDL Cholesterol Cal 54 (H) 5 - 40 mg/dL   LDL Calculated 76 0 - 99 mg/dL  Comprehensive metabolic panel  Result Value Ref Range   Glucose 113 (H) 65 - 99 mg/dL   BUN 7 (L) 8 - 27 mg/dL   Creatinine, Ser 0.72 0.57 - 1.00 mg/dL   GFR calc non Af Amer 85 >59 mL/min/1.73   GFR calc Af Amer 98 >59 mL/min/1.73   BUN/Creatinine Ratio 10 (L) 12 - 28   Sodium 138 134 - 144 mmol/L   Potassium 3.6 3.5 - 5.2 mmol/L   Chloride 99 96 - 106 mmol/L   CO2 23 20 - 29 mmol/L   Calcium 8.5 (L) 8.7 - 10.3 mg/dL   Total Protein 6.1 6.0 - 8.5 g/dL   Albumin 4.1 3.8 - 4.8 g/dL   Globulin, Total 2.0 1.5 - 4.5 g/dL   Albumin/Globulin Ratio 2.1 1.2 - 2.2   Bilirubin Total 0.4 0.0 - 1.2 mg/dL   Alkaline Phosphatase 120 (H) 39 - 117 IU/L   AST 14 0 - 40 IU/L   ALT 16 0 - 32 IU/L  Bayer DCA Hb A1c Waived  Result Value Ref Range   HB A1C (BAYER DCA - WAIVED) 7.9 (H) <7.0 %      Assessment & Plan:   Problem List Items Addressed This Visit      Other   Low back pain - Primary   Relevant Medications   naproxen (NAPROSYN) 500 MG tablet   cyclobenzaprine (FLEXERIL) 10 MG tablet   Other Relevant Orders   UA/M w/rflx Culture, Routine   DG Lumbar Spine Complete    Other Visit Diagnoses    Immunization due       Flu shot given today.   Relevant Orders   Flu Vaccine QUAD High Dose(Fluad) (Completed)       Follow up plan: Return 2 weeks, for follow up back pain.

## 2019-05-01 ENCOUNTER — Telehealth: Payer: Self-pay | Admitting: Family Medicine

## 2019-05-01 DIAGNOSIS — M24651 Ankylosis, right hip: Secondary | ICD-10-CM

## 2019-05-01 NOTE — Telephone Encounter (Signed)
Patient notified

## 2019-05-01 NOTE — Telephone Encounter (Signed)
Please let her know that her hip shows some arthritis and a little arthritis in her back. I've referred her to orthopedics to see if they can get her a shot to help her feel better. Let me know if she has any questions.

## 2019-05-03 ENCOUNTER — Other Ambulatory Visit: Payer: Self-pay | Admitting: Pharmacy Technician

## 2019-05-03 NOTE — Patient Outreach (Signed)
[place] [facility]) Care Management  05/03/2019  Sarah White 1946-07-14 [id number]   Care coordination in basket message received from embedded [place] in regards to patient's BI application for Spiriva.  BI denied the application due to patient being over income. The decision can be appealed but a printout of the cost of the medicaiton is needed to appeal the decision.  Recevied the following in basket message: "Patient notes that she fears going into [facility] now d/t pandemic, and is able to pay the cost of Spiriva now that the Trulicity is free"  Will close patient assistance case at this time as patient is able to pay for the Spiriva and can re open case if medication becomes cost prohibitive and continue to appeal the decision.  Will route note to [place] that patient assistance is completed and removed myself from care team.  Luiz Ochoa. Gabor Lusk, [employer] [phone]

## 2019-05-12 ENCOUNTER — Ambulatory Visit: Payer: Medicare Other | Admitting: Family Medicine

## 2019-05-15 ENCOUNTER — Inpatient Hospital Stay
Admit: 2019-05-15 | Discharge: 2019-05-15 | Disposition: A | Discharge status: Home / Self Care | Payer: Medicare Other | Attending: Internal Medicine | Admitting: Internal Medicine

## 2019-05-15 ENCOUNTER — Other Ambulatory Visit: Payer: Self-pay

## 2019-05-15 ENCOUNTER — Inpatient Hospital Stay
Admission: EM | Admit: 2019-05-15 | Discharge: 2019-05-17 | DRG: 291 | Disposition: A | Discharge status: Home / Self Care | Payer: Medicare Other | Attending: Internal Medicine | Admitting: Internal Medicine

## 2019-05-15 ENCOUNTER — Encounter: Payer: Self-pay | Admitting: Emergency Medicine

## 2019-05-15 ENCOUNTER — Emergency Department: Payer: Medicare Other

## 2019-05-15 DIAGNOSIS — Z90710 Acquired absence of both cervix and uterus: Secondary | ICD-10-CM | POA: Diagnosis not present

## 2019-05-15 DIAGNOSIS — Z20828 Contact with and (suspected) exposure to other viral communicable diseases: Secondary | ICD-10-CM | POA: Diagnosis present

## 2019-05-15 DIAGNOSIS — Z7982 Long term (current) use of aspirin: Secondary | ICD-10-CM

## 2019-05-15 DIAGNOSIS — F17210 Nicotine dependence, cigarettes, uncomplicated: Secondary | ICD-10-CM | POA: Diagnosis present

## 2019-05-15 DIAGNOSIS — Z9049 Acquired absence of other specified parts of digestive tract: Secondary | ICD-10-CM | POA: Diagnosis not present

## 2019-05-15 DIAGNOSIS — I2510 Atherosclerotic heart disease of native coronary artery without angina pectoris: Secondary | ICD-10-CM | POA: Diagnosis present

## 2019-05-15 DIAGNOSIS — E872 Acidosis: Secondary | ICD-10-CM | POA: Diagnosis present

## 2019-05-15 DIAGNOSIS — I447 Left bundle-branch block, unspecified: Secondary | ICD-10-CM | POA: Diagnosis present

## 2019-05-15 DIAGNOSIS — J9622 Acute and chronic respiratory failure with hypercapnia: Secondary | ICD-10-CM | POA: Diagnosis present

## 2019-05-15 DIAGNOSIS — I5043 Acute on chronic combined systolic (congestive) and diastolic (congestive) heart failure: Secondary | ICD-10-CM | POA: Diagnosis present

## 2019-05-15 DIAGNOSIS — Z7984 Long term (current) use of oral hypoglycemic drugs: Secondary | ICD-10-CM

## 2019-05-15 DIAGNOSIS — Z888 Allergy status to other drugs, medicaments and biological substances status: Secondary | ICD-10-CM | POA: Diagnosis not present

## 2019-05-15 DIAGNOSIS — I110 Hypertensive heart disease with heart failure: Principal | ICD-10-CM | POA: Diagnosis present

## 2019-05-15 DIAGNOSIS — I509 Heart failure, unspecified: Secondary | ICD-10-CM | POA: Diagnosis not present

## 2019-05-15 DIAGNOSIS — J80 Acute respiratory distress syndrome: Secondary | ICD-10-CM | POA: Diagnosis not present

## 2019-05-15 DIAGNOSIS — R069 Unspecified abnormalities of breathing: Secondary | ICD-10-CM | POA: Diagnosis not present

## 2019-05-15 DIAGNOSIS — Z885 Allergy status to narcotic agent status: Secondary | ICD-10-CM

## 2019-05-15 DIAGNOSIS — J441 Chronic obstructive pulmonary disease with (acute) exacerbation: Secondary | ICD-10-CM | POA: Diagnosis not present

## 2019-05-15 DIAGNOSIS — J9601 Acute respiratory failure with hypoxia: Secondary | ICD-10-CM | POA: Diagnosis not present

## 2019-05-15 DIAGNOSIS — Z9114 Patient's other noncompliance with medication regimen: Secondary | ICD-10-CM

## 2019-05-15 DIAGNOSIS — I5021 Acute systolic (congestive) heart failure: Secondary | ICD-10-CM | POA: Diagnosis not present

## 2019-05-15 DIAGNOSIS — Z955 Presence of coronary angioplasty implant and graft: Secondary | ICD-10-CM | POA: Diagnosis not present

## 2019-05-15 DIAGNOSIS — Z79899 Other long term (current) drug therapy: Secondary | ICD-10-CM

## 2019-05-15 DIAGNOSIS — I10 Essential (primary) hypertension: Secondary | ICD-10-CM | POA: Diagnosis not present

## 2019-05-15 DIAGNOSIS — R0602 Shortness of breath: Secondary | ICD-10-CM | POA: Diagnosis not present

## 2019-05-15 DIAGNOSIS — J9602 Acute respiratory failure with hypercapnia: Secondary | ICD-10-CM | POA: Diagnosis not present

## 2019-05-15 DIAGNOSIS — J9621 Acute and chronic respiratory failure with hypoxia: Secondary | ICD-10-CM | POA: Diagnosis present

## 2019-05-15 DIAGNOSIS — E119 Type 2 diabetes mellitus without complications: Secondary | ICD-10-CM | POA: Diagnosis present

## 2019-05-15 DIAGNOSIS — Z66 Do not resuscitate: Secondary | ICD-10-CM | POA: Diagnosis present

## 2019-05-15 DIAGNOSIS — R231 Pallor: Secondary | ICD-10-CM | POA: Diagnosis not present

## 2019-05-15 DIAGNOSIS — Z85828 Personal history of other malignant neoplasm of skin: Secondary | ICD-10-CM | POA: Diagnosis not present

## 2019-05-15 DIAGNOSIS — Z8249 Family history of ischemic heart disease and other diseases of the circulatory system: Secondary | ICD-10-CM

## 2019-05-15 LAB — BLOOD GAS, VENOUS
Acid-base deficit: 2.1 mmol/L — ABNORMAL HIGH (ref 0.0–2.0)
Bicarbonate: 27.9 mmol/L (ref 20.0–28.0)
O2 Saturation: 94.4 %
Patient temperature: 37
pCO2, Ven: 73 mmHg (ref 44.0–60.0)
pH, Ven: 7.19 — CL (ref 7.250–7.430)
pO2, Ven: 89 mmHg — ABNORMAL HIGH (ref 32.0–45.0)

## 2019-05-15 LAB — TROPONIN I (HIGH SENSITIVITY): Troponin I (High Sensitivity): 16 ng/L (ref <18)

## 2019-05-15 LAB — URINALYSIS, COMPLETE (UACMP) WITH MICROSCOPIC
Bacteria, UA: NONE SEEN
Bilirubin Urine: NEGATIVE
Glucose, UA: NEGATIVE mg/dL
Hgb urine dipstick: NEGATIVE
Ketones, ur: NEGATIVE mg/dL
Leukocytes,Ua: NEGATIVE
Nitrite: NEGATIVE
Protein, ur: 100 mg/dL — AB
Specific Gravity, Urine: 1.009 (ref 1.005–1.030)
pH: 5 (ref 5.0–8.0)

## 2019-05-15 LAB — CBC WITH DIFFERENTIAL/PLATELET
Abs Immature Granulocytes: 0.09 10*3/uL — ABNORMAL HIGH (ref 0.00–0.07)
Basophils Absolute: 0.1 10*3/uL (ref 0.0–0.1)
Basophils Relative: 1 %
Eosinophils Absolute: 0.2 10*3/uL (ref 0.0–0.5)
Eosinophils Relative: 1 %
HCT: 51.6 % — ABNORMAL HIGH (ref 36.0–46.0)
Hemoglobin: 16 g/dL — ABNORMAL HIGH (ref 12.0–15.0)
Immature Granulocytes: 1 %
Lymphocytes Relative: 23 %
Lymphs Abs: 4.1 10*3/uL — ABNORMAL HIGH (ref 0.7–4.0)
MCH: 26.7 pg (ref 26.0–34.0)
MCHC: 31 g/dL (ref 30.0–36.0)
MCV: 86.1 fL (ref 80.0–100.0)
Monocytes Absolute: 0.8 10*3/uL (ref 0.1–1.0)
Monocytes Relative: 5 %
Neutro Abs: 12.3 10*3/uL — ABNORMAL HIGH (ref 1.7–7.7)
Neutrophils Relative %: 69 %
Platelets: 146 10*3/uL — ABNORMAL LOW (ref 150–400)
RBC: 5.99 MIL/uL — ABNORMAL HIGH (ref 3.87–5.11)
RDW: 14.6 % (ref 11.5–15.5)
WBC: 17.6 10*3/uL — ABNORMAL HIGH (ref 4.0–10.5)
nRBC: 0 % (ref 0.0–0.2)

## 2019-05-15 LAB — MRSA PCR SCREENING: MRSA by PCR: NEGATIVE

## 2019-05-15 LAB — BASIC METABOLIC PANEL
Anion gap: 12 (ref 5–15)
BUN: 9 mg/dL (ref 8–23)
CO2: 24 mmol/L (ref 22–32)
Calcium: 9.2 mg/dL (ref 8.9–10.3)
Chloride: 104 mmol/L (ref 98–111)
Creatinine, Ser: 0.81 mg/dL (ref 0.44–1.00)
GFR calc Af Amer: >60 mL/min (ref >60)
GFR calc non Af Amer: >60 mL/min (ref >60)
Glucose, Bld: 262 mg/dL — ABNORMAL HIGH (ref 70–99)
Potassium: 3.9 mmol/L (ref 3.5–5.1)
Sodium: 140 mmol/L (ref 135–145)

## 2019-05-15 LAB — PROCALCITONIN: Procalcitonin: 0.15 ng/mL

## 2019-05-15 LAB — LACTIC ACID, PLASMA
Lactic Acid, Venous: 2.3 mmol/L (ref 0.5–1.9)
Lactic Acid, Venous: 2.1 mmol/L (ref 0.5–1.9)

## 2019-05-15 LAB — BRAIN NATRIURETIC PEPTIDE: B Natriuretic Peptide: 424 pg/mL — ABNORMAL HIGH (ref 0.0–100.0)

## 2019-05-15 LAB — SARS CORONAVIRUS 2 BY RT PCR (HOSPITAL ORDER, PERFORMED IN ~~LOC~~ HOSPITAL LAB): SARS Coronavirus 2: NEGATIVE

## 2019-05-15 LAB — GLUCOSE, CAPILLARY: Glucose-Capillary: 143 mg/dL — ABNORMAL HIGH (ref 70–99)

## 2019-05-15 LAB — INFLUENZA PANEL BY PCR (TYPE A & B)
Influenza A By PCR: NEGATIVE
Influenza B By PCR: NEGATIVE

## 2019-05-15 MED ORDER — METHYLPREDNISOLONE SODIUM SUCC 125 MG IJ SOLR
125.0000 mg | Freq: Once | INTRAMUSCULAR | Status: AC
  Administered 2019-05-15: 125 mg via INTRAVENOUS
  Filled 2019-05-15: qty 2

## 2019-05-15 MED ORDER — ALBUTEROL SULFATE (2.5 MG/3ML) 0.083% IN NEBU: 2.5000 mg | INHALATION_SOLUTION | RESPIRATORY_TRACT | Status: DC | PRN

## 2019-05-15 MED ORDER — ACETAMINOPHEN 325 MG PO TABS
650.0000 mg | ORAL_TABLET | Freq: Four times a day (QID) | ORAL | Status: DC | PRN
  Administered 2019-05-16: 22:00:00 650 mg via ORAL
  Filled 2019-05-15: qty 2

## 2019-05-15 MED ORDER — PERFLUTREN LIPID MICROSPHERE
1.0000 mL | INTRAVENOUS | Status: AC | PRN
  Administered 2019-05-15: 20:00:00 3 mL via INTRAVENOUS
  Filled 2019-05-15: qty 10

## 2019-05-15 MED ORDER — CHLORHEXIDINE GLUCONATE CLOTH 2 % EX PADS
6.0000 | MEDICATED_PAD | Freq: Every day | CUTANEOUS | Status: DC
  Administered 2019-05-15: 6 via TOPICAL

## 2019-05-15 MED ORDER — FUROSEMIDE 40 MG PO TABS
40.0000 mg | ORAL_TABLET | ORAL | Status: DC
  Administered 2019-05-15: 40 mg via ORAL
  Filled 2019-05-15: qty 2
  Filled 2019-05-15: qty 1

## 2019-05-15 MED ORDER — IPRATROPIUM-ALBUTEROL 0.5-2.5 (3) MG/3ML IN SOLN
3.0000 mL | Freq: Once | RESPIRATORY_TRACT | Status: AC
  Administered 2019-05-15: 3 mL via RESPIRATORY_TRACT

## 2019-05-15 MED ORDER — CYCLOBENZAPRINE HCL 10 MG PO TABS
10.0000 mg | ORAL_TABLET | Freq: Three times a day (TID) | ORAL | Status: DC | PRN
  Administered 2019-05-15 – 2019-05-17 (×4): 10 mg via ORAL
  Filled 2019-05-15 (×5): qty 1

## 2019-05-15 MED ORDER — TRAZODONE HCL 50 MG PO TABS
50.0000 mg | ORAL_TABLET | Freq: Every evening | ORAL | Status: DC | PRN
  Administered 2019-05-15 – 2019-05-16 (×2): 50 mg via ORAL
  Filled 2019-05-15 (×2): qty 1

## 2019-05-15 MED ORDER — ENOXAPARIN SODIUM 40 MG/0.4ML ~~LOC~~ SOLN
40.0000 mg | SUBCUTANEOUS | Status: DC
  Administered 2019-05-15 – 2019-05-16 (×2): 40 mg via SUBCUTANEOUS
  Filled 2019-05-15 (×2): qty 0.4

## 2019-05-15 MED ORDER — METHYLPREDNISOLONE SODIUM SUCC 40 MG IJ SOLR
40.0000 mg | Freq: Four times a day (QID) | INTRAMUSCULAR | Status: DC
  Administered 2019-05-15 – 2019-05-16 (×3): 40 mg via INTRAVENOUS
  Filled 2019-05-15 (×3): qty 1

## 2019-05-15 MED ORDER — ALBUTEROL SULFATE HFA 108 (90 BASE) MCG/ACT IN AERS: 1.0000 | INHALATION_SPRAY | Freq: Four times a day (QID) | RESPIRATORY_TRACT | Status: DC | PRN

## 2019-05-15 MED ORDER — ISOSORBIDE MONONITRATE ER 30 MG PO TB24
60.0000 mg | ORAL_TABLET | Freq: Every day | ORAL | Status: DC
  Administered 2019-05-15 – 2019-05-17 (×3): 60 mg via ORAL
  Filled 2019-05-15 (×2): qty 2
  Filled 2019-05-15 (×2): qty 1

## 2019-05-15 MED ORDER — FUROSEMIDE 10 MG/ML IJ SOLN
20.0000 mg | Freq: Once | INTRAMUSCULAR | Status: AC
  Administered 2019-05-15: 20 mg via INTRAVENOUS
  Filled 2019-05-15: qty 4

## 2019-05-15 MED ORDER — CARVEDILOL 12.5 MG PO TABS
25.0000 mg | ORAL_TABLET | Freq: Two times a day (BID) | ORAL | Status: DC
  Administered 2019-05-15 – 2019-05-17 (×4): 25 mg via ORAL
  Filled 2019-05-15 (×4): qty 2
  Filled 2019-05-15 (×3): qty 8

## 2019-05-15 MED ORDER — POLYETHYLENE GLYCOL 3350 17 G PO PACK: 17.0000 g | PACK | Freq: Every day | ORAL | Status: DC | PRN

## 2019-05-15 MED ORDER — LORAZEPAM 2 MG/ML IJ SOLN
0.5000 mg | Freq: Once | INTRAMUSCULAR | Status: AC
  Administered 2019-05-15: 0.5 mg via INTRAVENOUS
  Filled 2019-05-15: qty 1

## 2019-05-15 MED ORDER — NITROGLYCERIN 0.4 MG SL SUBL: 0.4000 mg | SUBLINGUAL_TABLET | SUBLINGUAL | Status: DC | PRN

## 2019-05-15 MED ORDER — FUROSEMIDE 10 MG/ML IJ SOLN
20.0000 mg | Freq: Two times a day (BID) | INTRAMUSCULAR | Status: DC
  Administered 2019-05-15: 20 mg via INTRAVENOUS
  Filled 2019-05-15: qty 2

## 2019-05-15 MED ORDER — LEVOFLOXACIN IN D5W 750 MG/150ML IV SOLN
750.0000 mg | INTRAVENOUS | Status: DC
  Filled 2019-05-15: qty 150

## 2019-05-15 MED ORDER — ACETAMINOPHEN 650 MG RE SUPP: 650.0000 mg | Freq: Four times a day (QID) | RECTAL | Status: DC | PRN

## 2019-05-15 MED ORDER — SERTRALINE HCL 50 MG PO TABS
100.0000 mg | ORAL_TABLET | Freq: Every day | ORAL | Status: DC
  Administered 2019-05-15 – 2019-05-17 (×3): 100 mg via ORAL
  Filled 2019-05-15 (×3): qty 2

## 2019-05-15 MED ORDER — ATORVASTATIN CALCIUM 20 MG PO TABS
40.0000 mg | ORAL_TABLET | Freq: Every day | ORAL | Status: DC
  Administered 2019-05-15 – 2019-05-17 (×3): 40 mg via ORAL
  Filled 2019-05-15 (×3): qty 2

## 2019-05-15 MED ORDER — IPRATROPIUM-ALBUTEROL 0.5-2.5 (3) MG/3ML IN SOLN
RESPIRATORY_TRACT | Status: AC
  Filled 2019-05-15: qty 9

## 2019-05-15 MED ORDER — ASPIRIN EC 81 MG PO TBEC
81.0000 mg | DELAYED_RELEASE_TABLET | Freq: Every day | ORAL | Status: DC
  Administered 2019-05-15 – 2019-05-16 (×2): 81 mg via ORAL
  Filled 2019-05-15 (×2): qty 1

## 2019-05-15 MED ORDER — ADULT MULTIVITAMIN W/MINERALS CH
1.0000 | ORAL_TABLET | Freq: Every day | ORAL | Status: DC
  Administered 2019-05-15 – 2019-05-17 (×3): 1 via ORAL
  Filled 2019-05-15 (×3): qty 1

## 2019-05-15 MED ORDER — ONDANSETRON 4 MG PO TBDP
4.0000 mg | ORAL_TABLET | Freq: Three times a day (TID) | ORAL | Status: DC | PRN
  Filled 2019-05-15: qty 1

## 2019-05-15 MED ORDER — LEVOFLOXACIN IN D5W 750 MG/150ML IV SOLN
750.0000 mg | Freq: Once | INTRAVENOUS | Status: AC
  Administered 2019-05-15: 10:00:00 750 mg via INTRAVENOUS
  Filled 2019-05-15: qty 150

## 2019-05-15 NOTE — Progress Notes (Signed)
Family Meeting Note  Advance Directive:yes comes in from home by EMS with acute on chronic hypoxic hyper cardiac respiratory failure with COPD mild congestive heart failure/pulmonary edema. She has multiple comorbidities with diabetes and coronary artery disease. Code status addressed by me and ER physician earlier. Patient is a DNR. Consider palliative care consultation if needed. Time spent 16 minutes Fritzi Mandes, MD

## 2019-05-15 NOTE — ED Provider Notes (Signed)
[facility] Emergency Department Provider Note       Time seen: ----------------------------------------- 8:09 AM on 05/15/2019 ----------------------------------------- Level V caveat: History/ROS limited by respiratory distress  I have reviewed the triage vital signs and the nursing notes.  HISTORY   Chief Complaint Respiratory Distress    HPI Sarah White is a 73 y.o. female with a history of pancreatitis, respiratory failure, CHF, COPD, diabetes, hypertension, pneumonia, pulmonary edema who presents to the ED for shortness of breath.  Patient woke this morning short of breath, EMS reports patient initially hypertensive, 1 inch of Nitropaste was placed on the left chest by EMS and she was given a sublingual nitro spray.  She was placed on CPAP by EMS, was felt to be cool and clammy on arrival in severe respiratory distress.  Past Medical History:  Diagnosis Date  . Acute pancreatitis 09/09/2018  . Acute respiratory failure with hypoxia and hypercarbia ([place]) 07/22/2015  . Cancer ([place])    uterine  . CHF (congestive heart failure) ([place])   . COPD (chronic obstructive pulmonary disease) ([place])   . Diabetes mellitus without complication ([place])   . Encephalopathy acute 07/22/2015  . Hypertension   . Pneumonia    November 2016  . Pulmonary edema 07/22/2015  . 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 Insomnia 05/05/2018  . Squamous cell carcinoma of arm, right 01/07/2017  . Depression, major, single episode, moderate ([place]) 12/01/2016  . Unstable angina ([place]) 11/26/2016  . Near syncope 03/11/2016  . Hypokalemia 03/11/2016  . Orthostatic hypotension 08/12/2015  . Hyperlipidemia 07/29/2015  . Acute systolic CHF (congestive heart failure) ([place]) 07/25/2015  . Hypertensive renal disease 07/25/2015  . Low back pain 07/25/2015  . Smoker 07/22/2015  . COPD (chronic obstructive pulmonary  disease) ([place]) 07/22/2015  . Diabetes mellitus with chronic kidney disease ([place]) 07/22/2015  . CAD (coronary artery disease) 07/22/2015    Past Surgical History:  Procedure Laterality Date  . ABDOMINAL HYSTERECTOMY    . CARPAL TUNNEL RELEASE Bilateral   . CESAREAN SECTION    . CHOLECYSTECTOMY N/A 09/12/2018   Procedure: LAPAROSCOPIC CHOLECYSTECTOMY WITH INTRAOPERATIVE CHOLANGIOGRAM;  Surgeon: Herbert Pun, MD;  Location: [facility] ORS;  Service: General;  Laterality: N/A;  . CORONARY ANGIOPLASTY WITH STENT PLACEMENT    . KNEE SURGERY Left   . LEFT HEART CATH AND CORONARY ANGIOGRAPHY Right 11/26/2016   Procedure: Left Heart Cath and Coronary Angiography;  Surgeon: Isaias Cowman, MD;  Location: [facility];  Service: Cardiovascular;  Laterality: Right;    Allergies Amlodipine, Codeine, Hctz [hydrochlorothiazide], Lisinopril, and Metformin and related  Social History Social History   Tobacco Use  . Smoking status: Current Every Day Smoker    Packs/day: 0.50    Types: Cigarettes  . Smokeless tobacco: Never Used  Substance Use Topics  . Alcohol use: No  . Drug use: No    Review of Systems Respiratory: Positive for shortness of breath Review of systems otherwise unobtainable  All systems negative/normal/unremarkable except as stated in the HPI  ____________________________________________   PHYSICAL EXAM:  VITAL SIGNS: ED Triage Vitals  Enc Vitals Group     BP      Pulse      Resp      Temp      Temp src      SpO2      Weight      Height  Head Circumference      Peak Flow      Pain Score      Pain Loc      Pain Edu?      Excl. in [place]?     Constitutional: Alert, moderate to severe distress Eyes: Conjunctivae are normal. Normal extraocular movements. ENT      Head: Normocephalic and atraumatic.      Nose: No congestion/rhinnorhea.      Mouth/Throat: Mucous membranes are moist.      Neck: No stridor. Cardiovascular: Rapid rate, regular  rhythm. No murmurs, rubs, or gallops. Respiratory: Tachypnea with wheezing bilaterally, diminished breath sounds Gastrointestinal: Soft and nontender. Normal bowel sounds Musculoskeletal: Nontender with normal range of motion in extremities. No lower extremity tenderness nor edema. Neurologic:  Normal speech and language. No gross focal neurologic deficits are appreciated.  Skin: Cool, diaphoresis is noted Psychiatric: Mood and affect are normal. Speech and behavior are normal.  ____________________________________________  EKG: Interpreted by me.  Sinus tachycardia with a rate of 125 bpm, left bundle branch block, long QT  ____________________________________________  ED COURSE:  As part of my medical decision making, I reviewed the following data within the [place] History obtained from family if available, nursing notes, old chart and ekg, as well as notes from prior ED visits. Patient presented for acute respiratory distress, we will assess with labs and imaging as indicated at this time.  Patient was immediately placed on BiPAP, we will give nebs and steroids Clinical Course as of May 14 954  Mon May 15, 2019  0846 pH, Ven(!!): 7.19 [JW]  0846 pCO2, Ven(!!): 73 [JW]    Clinical Course User Index [JW] [name], MD   Procedures  SHAWNIE LOBELL was evaluated in Emergency Department on 05/15/2019 for the symptoms described in the history of present illness. She was evaluated in the context of the global COVID-19 pandemic, which necessitated consideration that the patient might be at risk for infection with the SARS-CoV-2 virus that causes COVID-19. Institutional protocols and algorithms that pertain to the evaluation of patients at risk for COVID-19 are in a state of rapid change based on information released by regulatory bodies including the CDC and federal and state organizations. These policies and algorithms were followed during the patient's care in  the ED.  ____________________________________________   LABS (pertinent positives/negatives)  Labs Reviewed  CBC WITH DIFFERENTIAL/PLATELET - Abnormal; Notable for the following components:      Result Value   WBC 17.6 (*)    RBC 5.99 (*)    Hemoglobin 16.0 (*)    HCT 51.6 (*)    Platelets 146 (*)    Neutro Abs 12.3 (*)    Lymphs Abs 4.1 (*)    Abs Immature Granulocytes 0.09 (*)    All other components within normal limits  BASIC METABOLIC PANEL - Abnormal; Notable for the following components:   Glucose, Bld 262 (*)    All other components within normal limits  BLOOD GAS, VENOUS - Abnormal; Notable for the following components:   pH, Ven 7.19 (*)    pCO2, Ven 73 (*)    pO2, Ven 89.0 (*)    Acid-base deficit 2.1 (*)    All other components within normal limits  SARS CORONAVIRUS 2 (HOSPITAL ORDER, [facility])  BRAIN NATRIURETIC PEPTIDE  TROPONIN I (HIGH SENSITIVITY)   CRITICAL CARE Performed by: Laurence Aly   Total critical care time: 30 minutes  Critical care time was exclusive of separately billable procedures and treating other patients.  Critical care was necessary to treat or prevent imminent or life-threatening deterioration.  Critical care was time spent personally by me on the following activities: development of treatment plan with patient and/or surrogate as well as nursing, discussions with consultants, evaluation of patient's response to treatment, examination of patient, obtaining history from patient or surrogate, ordering and performing treatments and interventions, ordering and review of laboratory studies, ordering and review of radiographic studies, pulse oximetry and re-evaluation of patient's condition.  RADIOLOGY Images were viewed by me  Chest x-ray IMPRESSION:  COPD.   Cardiomegaly with interstitial prominence, question early  interstitial edema.  ____________________________________________   DIFFERENTIAL  DIAGNOSIS   CHF, COPD, pneumonia, aspiration, PE, COVID-19  FINAL ASSESSMENT AND PLAN  Acute respiratory distress, COPD exacerbation   Plan: The patient had presented for acute respiratory distress. Patient's labs reveal acute hypercarbic respiratory failure with acidemia, also leukocytosis. Patient's imaging was reassuring overall.  She does have a new left bundle branch block on EKG but troponin is negative.  She appears to be improving on BiPAP, received duo nebs and steroids on arrival.  She has also received IV Levaquin.  Will discuss with the hospitalist for admission.  She states she does not want to be intubated if she worsens.   Laurence Aly, MD    Note: This note was generated in part or whole with voice recognition software. Voice recognition is usually quite accurate but there are transcription errors that can and very often do occur. I apologize for any typographical errors that were not detected and corrected.     [name], MD 05/15/19 [phone]

## 2019-05-15 NOTE — Progress Notes (Signed)
Patient arrived to unit at this time. Pt transferred to ICU bed. Pt is alert and oriented on bipap. Bipap was removed and pt placed on 3L [place], O2 sats 98%. Pt is in no respiratory distress. Breathing is normal. No complaints.

## 2019-05-15 NOTE — Progress Notes (Signed)
Pharmacy Antibiotic Note  Sarah White is a 73 y.o. female admitted on 05/15/2019 with pneumonia.  Pharmacy has been consulted for Levaquin dosing.  Plan: Levaquin 750mg  IV q24h  Weight: 184 lb 11.9 oz (83.8 kg)  Temp (24hrs), Avg:97.6 F (36.4 C), Min:97.6 F (36.4 C), Max:97.6 F (36.4 C)  Recent Labs  Lab 05/15/19 0816  WBC 17.6*  CREATININE 0.81    Estimated Creatinine Clearance: 69.1 mL/min (by C-G formula based on SCr of 0.81 mg/dL).    Allergies  Allergen Reactions  . Amlodipine Swelling  . Codeine Nausea And Vomiting  . Hctz [Hydrochlorothiazide] Other (See Comments)    Hypokalemia  . Lisinopril Other (See Comments)    Dizziness   . Metformin And Related Nausea Only    Nausea, dizziness    Antimicrobials this admission: Levaquin 9/14 >>   Thank you for allowing pharmacy to be a part of this patient's care.  Paulina Fusi, PharmD, BCPS 05/15/2019 10:58 AM

## 2019-05-15 NOTE — ED Notes (Addendum)
Patient resting.  Respirations regular and non labored.  Tolerating BiPAP well at this time.  SPO2 97% on 35%.  Son remains at bedside.  Continue to monitor.

## 2019-05-15 NOTE — ED Notes (Signed)
Patient resting.  Respiratory effort continues to improve.  Respirations regular and non labored.  Tolerating BiPap.

## 2019-05-15 NOTE — ED Triage Notes (Signed)
Arrives from home via [facility].  Patient awoke this morning SOB.  EMS reports patient initially HTN 230/120.  1" NTP placed to left chest by EMS.  Patient on CPAP by  EMS on arrival to ED.  Skin cool and clammy.  Initial sats reported by ems in the 80's.

## 2019-05-15 NOTE — Progress Notes (Signed)
*  PRELIMINARY RESULTS* Echocardiogram 2D Echocardiogram has been performed. Definity IV Contrast used on this study.  Sarah White Khushi Zupko 05/15/2019, 8:00 PM

## 2019-05-15 NOTE — H&P (Addendum)
[facility] [place] NAME: Sarah White    MR#:  [id number]  DATE OF BIRTH:  1946-04-20  DATE OF ADMISSION:  05/15/2019  PRIMARY CARE PHYSICIAN: Valerie Roys, DO   REQUESTING/REFERRING PHYSICIAN: dr Jimmye Norman  CHIEF COMPLAINT:   Patient came in with increasing shortness of breath HISTORY OF PRESENT ILLNESS:  Sarah White  is a 73 y.o. female with a known history of COPD with ongoing tobacco abuse not on chronic home oxygen, diabetes, hypertension, pulmonary edema and history of pancreatitis comes to the emergency room after she woke up in the morning with shortness of breath. Patient initially was hypotensive according to EMS received 1 inch off Nitro paste. She was placed on CT CPAP by EMS. Came to the emergency room now on BiPAP and hemodynamically stable feels a lot better.  She received IV Solu-Medrol, lorazepam, Lasix, Levaquin. She is being admitted to ICU step down for acute on chronic hyper carpet hypoxic respiratory failure due to COPD exacerbation and mild pulmonary edema.  pt COVID 19 negative.  PAST MEDICAL HISTORY:   Past Medical History:  Diagnosis Date  . Acute pancreatitis 09/09/2018  . Acute respiratory failure with hypoxia and hypercarbia ([place]) 07/22/2015  . Cancer ([place])    uterine  . CHF (congestive heart failure) ([place])   . COPD (chronic obstructive pulmonary disease) ([place])   . Diabetes mellitus without complication ([place])   . Encephalopathy acute 07/22/2015  . Hypertension   . Pneumonia    November 2016  . Pulmonary edema 07/22/2015  . Squamous cell cancer of skin of upper arm, right     PAST SURGICAL HISTOIRY:   Past Surgical History:  Procedure Laterality Date  . ABDOMINAL HYSTERECTOMY    . CARPAL TUNNEL RELEASE Bilateral   . CESAREAN SECTION    . CHOLECYSTECTOMY N/A 09/12/2018   Procedure: LAPAROSCOPIC CHOLECYSTECTOMY WITH INTRAOPERATIVE CHOLANGIOGRAM;  Surgeon: Herbert Pun, MD;   Location: [facility] ORS;  Service: General;  Laterality: N/A;  . CORONARY ANGIOPLASTY WITH STENT PLACEMENT    . KNEE SURGERY Left   . LEFT HEART CATH AND CORONARY ANGIOGRAPHY Right 11/26/2016   Procedure: Left Heart Cath and Coronary Angiography;  Surgeon: Isaias Cowman, MD;  Location: [facility];  Service: Cardiovascular;  Laterality: Right;    SOCIAL HISTORY:   Social History   Tobacco Use  . Smoking status: Current Every Day Smoker    Packs/day: 0.50    Types: Cigarettes  . Smokeless tobacco: Never Used  Substance Use Topics  . Alcohol use: No    FAMILY HISTORY:   Family History  Problem Relation Age of Onset  . Heart disease Mother     DRUG ALLERGIES:   Allergies  Allergen Reactions  . Amlodipine Swelling  . Codeine Nausea And Vomiting  . Hctz [Hydrochlorothiazide] Other (See Comments)    Hypokalemia  . Lisinopril Other (See Comments)    Dizziness   . Metformin And Related Nausea Only    Nausea, dizziness    REVIEW OF SYSTEMS:  Review of Systems  Constitutional: Negative for chills, fever and weight loss.  HENT: Negative for ear discharge, ear pain and nosebleeds.   Eyes: Negative for blurred vision, pain and discharge.  Respiratory: Positive for shortness of breath. Negative for sputum production, wheezing and stridor.   Cardiovascular: Negative for chest pain, palpitations, orthopnea and PND.  Gastrointestinal: Negative for abdominal pain, diarrhea, nausea and vomiting.  Genitourinary: Negative for frequency and urgency.  Musculoskeletal:  Negative for back pain and joint pain.  Neurological: Positive for weakness. Negative for sensory change, speech change and focal weakness.  Psychiatric/Behavioral: Negative for depression and hallucinations. The patient is not nervous/anxious.      MEDICATIONS AT HOME:   Prior to Admission medications   Medication Sig Start Date End Date Taking? Authorizing Provider  aspirin EC 81 MG tablet Take 81 mg by  mouth at bedtime.    Yes [provider]  atorvastatin (LIPITOR) 40 MG tablet Take 1 tablet (40 mg total) by mouth daily. 02/21/19  Yes Johnson, [name], DO  carvedilol (COREG) 12.5 MG tablet TAKE 2 TABLETS BY MOUTH TWICE DAILY WITH A MEAL 02/21/19  Yes Johnson, [name], DO  cyclobenzaprine (FLEXERIL) 10 MG tablet Take 1 tablet (10 mg total) by mouth 3 (three) times daily as needed for muscle spasms. DO NOT DRIVE ON THIS MEDICINE- WILL MAKE YOU SLEEPY 04/28/19  Yes Johnson, [name], DO  diphenhydrAMINE (BENADRYL) 25 MG tablet Take 25 mg by mouth every 6 (six) hours as needed for allergies.   Yes [provider]  Dulaglutide (TRULICITY) 1.5 0000000 SOPN Inject 1.5 mg into the skin once a week. 02/21/19  Yes Johnson, [name], DO  furosemide (LASIX) 40 MG tablet Take 1 tablet (40 mg total) by mouth daily. Patient taking differently: Take 40 mg by mouth every other day.  02/21/19  Yes Johnson, [name], DO  isosorbide mononitrate (IMDUR) 60 MG 24 hr tablet Take 1 tablet (60 mg total) by mouth daily. 02/21/19  Yes Johnson, [name], DO  Multiple Vitamin (MULTIVITAMIN WITH MINERALS) TABS tablet Take 1 tablet by mouth daily.   Yes [provider]  naproxen (NAPROSYN) 500 MG tablet Take 1 tablet (500 mg total) by mouth 2 (two) times daily with a meal. 04/28/19  Yes Johnson, [name], DO  nitroGLYCERIN (NITROSTAT) 0.4 MG SL tablet Place 1 tablet (0.4 mg total) under the tongue every 5 (five) minutes as needed for chest pain. 03/24/18  Yes Johnson, [name], DO  ondansetron (ZOFRAN-ODT) 4 MG disintegrating tablet Take 4 mg by mouth every 8 (eight) hours as needed for nausea or vomiting.   Yes [provider]  PROAIR HFA 108 (90 Base) MCG/ACT inhaler INHALE 1 TO 2 PUFFS BY MOUTH EVERY 4 HOURS AS NEEDED FOR WHEEZING OR SHORTNESS OF BREATH 02/21/19  Yes Johnson, [name], DO  sertraline (ZOLOFT) 100 MG tablet Take 1 tablet (100 mg total) by mouth daily. 02/21/19  Yes Johnson, [name], DO   tiotropium (SPIRIVA HANDIHALER) 18 MCG inhalation capsule Place 1 capsule (18 mcg total) into inhaler and inhale daily. 02/21/19  Yes Johnson, [name], DO  traZODone (DESYREL) 50 MG tablet TAKE 1/2 TO 1 (ONE-HALF TO ONE) TABLET BY MOUTH AT BEDTIME AS NEEDED FOR SLEEP 12/26/18  Yes Johnson, [name], DO      VITAL SIGNS:  Blood pressure (!) 161/87, pulse (!) 115, temperature 97.6 F (36.4 C), temperature source Axillary, resp. rate (!) 21, weight 83.8 kg, SpO2 96 %.  PHYSICAL EXAMINATION:  GENERAL:  73 y.o.-year-old patient lying in the bed with mild acute distress. Appears chronically ill EYES: Pupils equal, round, reactive to light and accommodation. No scleral icterus. Extraocular muscles intact. On BiPAP HEENT: Head atraumatic, normocephalic. Oropharynx and nasopharynx clear.  NECK:  Supple, no jugular venous distention. No thyroid enlargement, no tenderness.  LUNGS:decreased breath sounds bilaterally, no wheezing, rales,rhonchi or crepitation. No use of accessory muscles of respiration. +BIPAP CARDIOVASCULAR: S1,  S2 normal. No murmurs, rubs, or gallops. tachycardia ABDOMEN: Soft, nontender, nondistended. Bowel sounds present. No organomegaly or mass.  EXTREMITIES: No pedal edema, cyanosis, or clubbing.  NEUROLOGIC: Cranial nerves II through XII are intact. Muscle strength 5/5 in all extremities. Sensation intact. Gait not checked.  PSYCHIATRIC: The patient is alert and oriented x 3.  SKIN: No obvious rash, lesion, or ulcer.   LABORATORY PANEL:   CBC Recent Labs  Lab 05/15/19 0816  WBC 17.6*  HGB 16.0*  HCT 51.6*  PLT 146*   ------------------------------------------------------------------------------------------------------------------  Chemistries  Recent Labs  Lab 05/15/19 0816  NA 140  K 3.9  CL 104  CO2 24  GLUCOSE 262*  BUN 9  CREATININE 0.81  CALCIUM 9.2    ------------------------------------------------------------------------------------------------------------------  Cardiac Enzymes No results for input(s): TROPONINI in the last 168 hours. ------------------------------------------------------------------------------------------------------------------  RADIOLOGY:  Dg Chest Port 1 View  Result Date: 05/15/2019 CLINICAL DATA:  Shortness of breath EXAM: PORTABLE CHEST 1 VIEW COMPARISON:  09/08/2018 FINDINGS: There is hyperinflation of the lungs compatible with COPD. Cardiomegaly. Interstitial prominence throughout the lungs could reflect early interstitial edema. No effusions or acute bony abnormality. IMPRESSION: COPD. Cardiomegaly with interstitial prominence, question early interstitial edema. Electronically Signed   By: Rolm Baptise M.D.   On: 05/15/2019 08:32    EKG:  sinus tachycardia with left bundle branch block  IMPRESSION AND PLAN:   Sarah White  is a 73 y.o. female with a known history of COPD with ongoing tobacco abuse not on chronic home oxygen, diabetes, hypertension, pulmonary edema and history of pancreatitis comes to the emergency room after she woke up in the morning with shortness of breath.    1. Acute on chronic hypoxic/Hypercarbic respiratory failure secondary to COPD exacerbation in the setting of active tobacco abuse with mild pulmonary edema/CHF acute on chronic diastolic -mid to ICU step down -awaiting for ICU attending to call me back -IV Solu-Medrol, IV Lasix, empiric IV antibiotic with Levaquin. Pharmacy to dose Levaquin -inhalers, nebulizer -wean off BiPAP as tolerated.  2. Acute on chronic diastolic congestive heart failure in the setting of respiratory distress -IV Lasix 20 mg daily, monitor input output  3. Leukocytosis -could be stressed reactive however given emphysema respiratory distress will cover empirically with IV Levaquin  4. Left bundle branch block on EKG -patient denies chest pain.  Troponin negative. Continue to monitor -consider cardiology consultation if needed -continue aspirin statins  5. Diabetes type II -sliding scale insulin  6. DVT prophylaxis subcu Lovenox  No family in the ER   All the records are reviewed and case discussed with ED provider.   CODE STATUS: DNR  TOTAL TIME TAKING CARE OF THIS PATIENT: *55* minutes.    Fritzi Mandes M.D on 05/15/2019 at 10:47 AM  Between 7am to 6pm - Pager - [phone]  After 6pm go to [email or web address] - password [email or web address] [facility]  Office  [phone]  CC: Primary care physician; Valerie Roys, DO

## 2019-05-15 NOTE — Consult Note (Addendum)
Name: [name] MRN: [id number] DOB: [birth year]    ADMISSION DATE:  05/15/2019 CONSULTATION DATE:  05/15/2019  REFERRING MD :  Lenise Arena  CHIEF COMPLAINT:  Acute hypercapnic hypoxic respiratory distress with respiratory acidosis  BRIEF PATIENT DESCRIPTION: Sarah White is a 73 year old female with a history of CHF, COPD, 60pk/yr history of tobacco abuse, diabetes, hypertension, and pulmonary edema who presented to the ED via EMS with severe SOB.   SIGNIFICANT EVENTS/STUDIES 9/14 admitted to ICU via ED; using 4L O2 9/14 ABG shows acidosis (pH 7.19), hypercapnia (pCO2 73), and hyperoxemia (pO2 89.0); Elevated WBC (17.6) on CBC   HISTORY OF PRESENT ILLNESS:   Sarah White, a 73 yo female, presented to the ED today because she felt short of breath at home when laying down or walking. She admits to being non-compliant with her lasix, only taking it once or twice a week, because it makes her go to the bathroom too frequently. Pt admits that she knows this is why she is having issues. She was placed on CPAP by EMS, was placed on BiPAP in the ED, and is now in the ICU on 4L O2 sating in the upper 90s. Was given lasix 40 mg IV x 1 Transferred to ICU for admission, weaned off biPAP Breathing more comfortably now on 4 L [place] CXR c/w interstitial b/l edema   PAST MEDICAL HISTORY :   has a past medical history of Acute pancreatitis (09/09/2018), Acute respiratory failure with hypoxia and hypercarbia ([place]) (07/22/2015), Cancer ([place]), CHF (congestive heart failure) ([place]), COPD (chronic obstructive pulmonary disease) ([place]), Diabetes mellitus without complication ([place]), Encephalopathy acute (07/22/2015), Hypertension, Pneumonia, Pulmonary edema (07/22/2015), and Squamous cell cancer of skin of upper arm, right.  has a past surgical history that includes Knee surgery (Left); Carpal tunnel release (Bilateral); Coronary angioplasty with stent; Abdominal hysterectomy; Cesarean section; LEFT HEART  CATH AND CORONARY ANGIOGRAPHY (Right, 11/26/2016); and Cholecystectomy (N/A, 09/12/2018). Prior to Admission medications   Medication Sig Start Date End Date Taking? Authorizing Provider  aspirin EC 81 MG tablet Take 81 mg by mouth at bedtime.    Yes [provider]  atorvastatin (LIPITOR) 40 MG tablet Take 1 tablet (40 mg total) by mouth daily. 02/21/19  Yes Johnson, [name], DO  carvedilol (COREG) 12.5 MG tablet TAKE 2 TABLETS BY MOUTH TWICE DAILY WITH A MEAL 02/21/19  Yes Johnson, [name], DO  cyclobenzaprine (FLEXERIL) 10 MG tablet Take 1 tablet (10 mg total) by mouth 3 (three) times daily as needed for muscle spasms. DO NOT DRIVE ON THIS MEDICINE- WILL MAKE YOU SLEEPY 04/28/19  Yes Johnson, [name], DO  diphenhydrAMINE (BENADRYL) 25 MG tablet Take 25 mg by mouth every 6 (six) hours as needed for allergies.   Yes [provider]  Dulaglutide (TRULICITY) 1.5 0000000 SOPN Inject 1.5 mg into the skin once a week. 02/21/19  Yes Johnson, [name], DO  furosemide (LASIX) 40 MG tablet Take 1 tablet (40 mg total) by mouth daily. Patient taking differently: Take 40 mg by mouth every other day.  02/21/19  Yes Johnson, [name], DO  isosorbide mononitrate (IMDUR) 60 MG 24 hr tablet Take 1 tablet (60 mg total) by mouth daily. 02/21/19  Yes Johnson, [name], DO  Multiple Vitamin (MULTIVITAMIN WITH MINERALS) TABS tablet Take 1 tablet by mouth daily.   Yes [provider]  naproxen (NAPROSYN) 500 MG tablet Take 1 tablet (500 mg total) by mouth 2 (two) times daily with a meal. 04/28/19  Yes Johnson, [name], DO  nitroGLYCERIN (NITROSTAT) 0.4 MG SL tablet Place 1 tablet (0.4 mg total) under the tongue every 5 (five) minutes as needed for chest pain. 03/24/18  Yes Johnson, [name], DO  ondansetron (ZOFRAN-ODT) 4 MG disintegrating tablet Take 4 mg by mouth every 8 (eight) hours as needed for nausea or vomiting.   Yes [provider]  PROAIR HFA 108 (90 Base) MCG/ACT inhaler INHALE 1 TO 2  PUFFS BY MOUTH EVERY 4 HOURS AS NEEDED FOR WHEEZING OR SHORTNESS OF BREATH 02/21/19  Yes Johnson, [name], DO  sertraline (ZOLOFT) 100 MG tablet Take 1 tablet (100 mg total) by mouth daily. 02/21/19  Yes Johnson, [name], DO  tiotropium (SPIRIVA HANDIHALER) 18 MCG inhalation capsule Place 1 capsule (18 mcg total) into inhaler and inhale daily. 02/21/19  Yes Johnson, [name], DO  traZODone (DESYREL) 50 MG tablet TAKE 1/2 TO 1 (ONE-HALF TO ONE) TABLET BY MOUTH AT BEDTIME AS NEEDED FOR SLEEP 12/26/18  Yes Johnson, [name], DO   Allergies  Allergen Reactions   Amlodipine Swelling   Codeine Nausea And Vomiting   Hctz [Hydrochlorothiazide] Other (See Comments)    Hypokalemia   Lisinopril Other (See Comments)    Dizziness    Metformin And Related Nausea Only    Nausea, dizziness    FAMILY HISTORY:  family history includes Heart disease in her mother. SOCIAL HISTORY:  reports that she has been smoking cigarettes. She has been smoking about 0.50 packs per day. She has never used smokeless tobacco. She reports that she does not drink alcohol or use drugs.  REVIEW OF SYSTEMS:   Constitutional: Negative for fever, chills, weight loss, malaise/fatigue and diaphoresis.  HENT: Negative for hearing loss, ear pain, nosebleeds, congestion, sore throat, neck pain, tinnitus and ear discharge.   Eyes: Negative for blurred vision, double vision, photophobia, pain, discharge and redness.  Respiratory: Negative for cough, hemoptysis, sputum production, shortness of breath, wheezing and stridor.   Cardiovascular: Negative for chest pain, palpitations, orthopnea, claudication, leg swelling and PND.  Gastrointestinal: Negative for heartburn, nausea, vomiting, abdominal pain, diarrhea, constipation, blood in stool and melena.  Genitourinary: Negative for dysuria, urgency, frequency, hematuria and flank pain.  Musculoskeletal: Negative for myalgias, back pain, joint pain and falls.  Skin: Negative for itching  and rash.  Neurological: Negative for dizziness, tingling, tremors, sensory change, speech change, focal weakness, seizures, loss of consciousness, weakness and headaches.  Endo/Heme/Allergies: Negative for environmental allergies and polydipsia. Does not bruise/bleed easily.   VITAL SIGNS: Temp:  [97.6 F (36.4 C)-98.6 F (37 C)] 98.6 F (37 C) (09/14 1200) Pulse Rate:  [105-133] 105 (09/14 1300) Resp:  [17-26] 21 (09/14 1300) BP: (91-162)/(62-96) 156/73 (09/14 1200) SpO2:  [95 %-99 %] 98 % (09/14 1300) FiO2 (%):  [35 %-45 %] 35 % (09/14 0943) Weight:  [81.2 kg-83.8 kg] 81.2 kg (09/14 1200)  PHYSICAL EXAMINATION: General:  No acute distress, A&Ox4 Neuro:  PEERL, normal speech and appropriate responses HEENT:  Normocephalic and atruamatic Cardiovascular:  Tachycardic with no m/r/g Lungs:  Diminished breath sounds with no wheezing noted Abdomen:  Soft, non-tender, non-distended Extremities: warm, slight 1+ pitting edema noted in ankles Skin:  Warm, dry, and intact  Recent Labs  Lab 05/15/19 0816  NA 140  K 3.9  CL 104  CO2 24  BUN 9  CREATININE 0.81  GLUCOSE 262*   Recent Labs  Lab 05/15/19 0816  HGB 16.0*  HCT 51.6*  WBC 17.6*  PLT 146*  Dg Chest Port 1 View  Result Date: 05/15/2019 CLINICAL DATA:  Shortness of breath EXAM: PORTABLE CHEST 1 VIEW COMPARISON:  09/08/2018 FINDINGS: There is hyperinflation of the lungs compatible with COPD. Cardiomegaly. Interstitial prominence throughout the lungs could reflect early interstitial edema. No effusions or acute bony abnormality. IMPRESSION: COPD. Cardiomegaly with interstitial prominence, question early interstitial edema. Electronically Signed   By: Rolm Baptise M.D.   On: 05/15/2019 08:32     COVID-19 NEGATIVE: Acute COVID-19 infection ruled out by PCR.     ASSESSMENT / PLAN:    73 yo female admitted to ICU for acute hypoxic hypercapnic respiratory failure from acute pulmonary edema due to fluid overload from  non-compliance with diuretic medications systolic CHF exacerbation and possible unidentified infectious source causing pneumonia with COPD exacerbation with sepsis.  Acute hypoxic hypercapnic respiratory failure COPD exacerbation ABG c/w resp acidosis Wean off biPAP, use as needed and tolerated - Continue oxygenation via nasal cannula and wean as appropriate - Continue bronchodilator therapy -ABG if clinically   SEVERE COPD EXACERBATION -continue IV steroids as prescribed -continue NEB THERAPY as prescribed -morphine as needed -wean fio2 as needed and tolerated Continue IV abx for now Check procalcitonin   Acute on chronic systolic CHF - Continue diuresis daily - Repeat echocardiogram (most recent 01/20)   Infectious Disease - Possible infectious source Check procalcitonin - obtain lactic acid after pro calcitonin results  - obtain blood cultures after pro calcitonin results  - obtain UA - repeat CBC daily - continue Levofloxacin IV  Electrolytes  - monitor CMP intermittently - Correct electrolytes as indicated DVT prophylaxis - enoxaparin daily     Corrin Parker, M.D.  [facility]  Medical Director [place] Director [facility] Cardio-Pulmonary Department

## 2019-05-15 NOTE — Progress Notes (Signed)
RT assisted with patient transport from ER to ICU room 19, while patient on the V60 BiPap, with no complications.

## 2019-05-16 LAB — CBC
HCT: 42.8 % (ref 36.0–46.0)
Hemoglobin: 13.8 g/dL (ref 12.0–15.0)
MCH: 27.2 pg (ref 26.0–34.0)
MCHC: 32.2 g/dL (ref 30.0–36.0)
MCV: 84.3 fL (ref 80.0–100.0)
Platelets: 150 10*3/uL (ref 150–400)
RBC: 5.08 MIL/uL (ref 3.87–5.11)
RDW: 14.2 % (ref 11.5–15.5)
WBC: 11 10*3/uL — ABNORMAL HIGH (ref 4.0–10.5)
nRBC: 0 % (ref 0.0–0.2)

## 2019-05-16 LAB — GLUCOSE, CAPILLARY
Glucose-Capillary: 167 mg/dL — ABNORMAL HIGH (ref 70–99)
Glucose-Capillary: 168 mg/dL — ABNORMAL HIGH (ref 70–99)
Glucose-Capillary: 185 mg/dL — ABNORMAL HIGH (ref 70–99)

## 2019-05-16 LAB — BASIC METABOLIC PANEL
Anion gap: 10 (ref 5–15)
BUN: 16 mg/dL (ref 8–23)
CO2: 28 mmol/L (ref 22–32)
Calcium: 9.1 mg/dL (ref 8.9–10.3)
Chloride: 101 mmol/L (ref 98–111)
Creatinine, Ser: 0.72 mg/dL (ref 0.44–1.00)
GFR calc Af Amer: >60 mL/min (ref >60)
GFR calc non Af Amer: >60 mL/min (ref >60)
Glucose, Bld: 169 mg/dL — ABNORMAL HIGH (ref 70–99)
Potassium: 3.7 mmol/L (ref 3.5–5.1)
Sodium: 139 mmol/L (ref 135–145)

## 2019-05-16 LAB — ECHOCARDIOGRAM COMPLETE
Height: 64 in
Weight: 2864.22 oz

## 2019-05-16 MED ORDER — FUROSEMIDE 10 MG/ML IJ SOLN: 20.0000 mg | Freq: Two times a day (BID) | INTRAMUSCULAR | Status: DC

## 2019-05-16 MED ORDER — INSULIN ASPART 100 UNIT/ML ~~LOC~~ SOLN: 0.0000 [IU] | Freq: Every day | SUBCUTANEOUS | Status: DC

## 2019-05-16 MED ORDER — PREDNISONE 20 MG PO TABS
40.0000 mg | ORAL_TABLET | Freq: Every day | ORAL | Status: DC
  Administered 2019-05-16 – 2019-05-17 (×2): 40 mg via ORAL
  Filled 2019-05-16 (×2): qty 2

## 2019-05-16 MED ORDER — TIOTROPIUM BROMIDE MONOHYDRATE 18 MCG IN CAPS
18.0000 ug | ORAL_CAPSULE | Freq: Every day | RESPIRATORY_TRACT | Status: DC
  Administered 2019-05-16 – 2019-05-17 (×2): 18 ug via RESPIRATORY_TRACT
  Filled 2019-05-16: qty 5

## 2019-05-16 MED ORDER — INSULIN ASPART 100 UNIT/ML ~~LOC~~ SOLN
0.0000 [IU] | Freq: Three times a day (TID) | SUBCUTANEOUS | Status: DC
  Administered 2019-05-16 (×2): 2 [IU] via SUBCUTANEOUS
  Filled 2019-05-16 (×2): qty 1

## 2019-05-16 MED ORDER — FUROSEMIDE 10 MG/ML IJ SOLN
20.0000 mg | Freq: Every day | INTRAMUSCULAR | Status: DC
  Administered 2019-05-16 – 2019-05-17 (×2): 20 mg via INTRAVENOUS
  Filled 2019-05-16 (×2): qty 2

## 2019-05-16 NOTE — Progress Notes (Addendum)
[facility] [place] NAME: Sarah White    MR#:  [id number]  DATE OF BIRTH:  [birth year]  SUBJECTIVE:   Patient transferred out of the ICU. Feels a whole lot better. Breathing much improved. Currently on 3 L nasal cannula oxygen sats 99%. No wheezing or chest pain. No Fever REVIEW OF SYSTEMS:   Review of Systems  Constitutional: Negative for chills, fever and weight loss.  HENT: Negative for ear discharge, ear pain and nosebleeds.   Eyes: Negative for blurred vision, pain and discharge.  Respiratory: Positive for shortness of breath. Negative for sputum production, wheezing and stridor.   Cardiovascular: Negative for chest pain, palpitations, orthopnea and PND.  Gastrointestinal: Negative for abdominal pain, diarrhea, nausea and vomiting.  Genitourinary: Negative for frequency and urgency.  Musculoskeletal: Negative for back pain and joint pain.  Neurological: Positive for weakness. Negative for sensory change, speech change and focal weakness.  Psychiatric/Behavioral: Negative for depression and hallucinations. The patient is not nervous/anxious.    Tolerating Diet:yes Tolerating PT: not needed  DRUG ALLERGIES:   Allergies  Allergen Reactions  . Amlodipine Swelling  . Codeine Nausea And Vomiting  . Hctz [Hydrochlorothiazide] Other (See Comments)    Hypokalemia  . Lisinopril Other (See Comments)    Dizziness   . Metformin And Related Nausea Only    Nausea, dizziness    VITALS:  Blood pressure 135/62, pulse 83, temperature 98.7 F (37.1 C), temperature source Oral, resp. rate 20, height 5\' 4"  (1.626 m), weight 81.2 kg, SpO2 99 %.  PHYSICAL EXAMINATION:   Physical Exam  GENERAL:  73 y.o.-year-old patient lying in the bed with no acute distress.  EYES: Pupils equal, round, reactive to light and accommodation. No scleral icterus. Extraocular muscles intact.  HEENT: Head atraumatic, normocephalic. Oropharynx and  nasopharynx clear.  NECK:  Supple, no jugular venous distention. No thyroid enlargement, no tenderness.  LUNGS: distant breath sounds bilaterally, no wheezing, rales, rhonchi. No use of accessory muscles of respiration.  CARDIOVASCULAR: S1, S2 normal. No murmurs, rubs, or gallops.  ABDOMEN: Soft, nontender, nondistended. Bowel sounds present. No organomegaly or mass.  EXTREMITIES: No cyanosis, clubbing or edema b/l.    NEUROLOGIC: Cranial nerves II through XII are intact. No focal Motor or sensory deficits b/l.   PSYCHIATRIC:  patient is alert and oriented x 3.  SKIN: No obvious rash, lesion, or ulcer.   LABORATORY PANEL:  CBC Recent Labs  Lab 05/16/19 0346  WBC 11.0*  HGB 13.8  HCT 42.8  PLT 150    Chemistries  Recent Labs  Lab 05/16/19 0346  NA 139  K 3.7  CL 101  CO2 28  GLUCOSE 169*  BUN 16  CREATININE 0.72  CALCIUM 9.1   Cardiac Enzymes No results for input(s): TROPONINI in the last 168 hours. RADIOLOGY:  Dg Chest Port 1 View  Result Date: 05/15/2019 CLINICAL DATA:  Shortness of breath EXAM: PORTABLE CHEST 1 VIEW COMPARISON:  09/08/2018 FINDINGS: There is hyperinflation of the lungs compatible with COPD. Cardiomegaly. Interstitial prominence throughout the lungs could reflect early interstitial edema. No effusions or acute bony abnormality. IMPRESSION: COPD. Cardiomegaly with interstitial prominence, question early interstitial edema. Electronically Signed   By: Rolm Baptise M.D.   On: 05/15/2019 08:32   ASSESSMENT AND PLAN:  Sarah White  is a 73 y.o. female with a known history of COPD with ongoing tobacco abuse not on chronic home oxygen, diabetes, hypertension, pulmonary edema and history of pancreatitis comes  to the emergency room after she woke up in the morning with shortness of breath.    1. Acute on chronic hypoxic/Hypercarbic respiratory failure secondary to COPD exacerbation in the setting of active tobacco abuse with mild pulmonary edema/CHF acute on  chronic diastolic -IV Solu-Medrol, IV Lasix. -Calcitonin negative. Antibiotics discontinue -inhalers, nebulizer -weaned off BiPAP -- now on 3 L nasal cannula oxygen--- try to wean her to room air. Patient is not keen on taking oxygen home if needed. -Patient reports noncompliance to Lasix at home--- now understands the importance of taking it.  2. Acute on chronic diastolic congestive heart failure in the setting of respiratory distress -IV Lasix 20 mg daily, monitor input output -urine output  3. Leukocytosis -could be stressed reactive  -came down to 11  4. Left bundle branch block on EKG -patient denies chest pain. Troponin negative. Continue to monitor -consider cardiology consultation if needed -continue aspirin statins  5. Diabetes type II -sliding scale insulin  6. DVT prophylaxis subcu Lovenox  Feeling better. Will continue diuresis one more day. Anticipate discharged tomorrow. Patient agreeable   Case discussed with Care Management/Social Worker. Management plans discussed with the patient and they are in agreement.  CODE STATUS: full  DVT Prophylaxis: lovenox  TOTAL TIME TAKING CARE OF THIS PATIENT: *30* minutes.  >50% time spent on counselling and coordination of care  POSSIBLE D/C IN  1 DAYS, DEPENDING ON CLINICAL CONDITION.  Note: This dictation was prepared with Dragon dictation along with smaller phrase technology. Any transcriptional errors that result from this process are unintentional.  Fritzi Mandes M.D on 05/16/2019 at 9:58 AM  Between 7am to 6pm - Pager - [phone]  After 6pm go to [email or web address] - password [email or web address] [facility]  Office  [phone]  CC: Primary care physician; Valerie Roys, DOPatient ID: Sarah White, female   DOB: 1946-08-02, 73 y.o.   MRN: [id number]

## 2019-05-16 NOTE — Progress Notes (Signed)
Patient weaned to room air.  O2 sats @ 92% on RA.  Clarise Cruz, BSN

## 2019-05-17 ENCOUNTER — Telehealth: Payer: Self-pay

## 2019-05-17 LAB — HEMOGLOBIN A1C
Hgb A1c MFr Bld: 6.5 % — ABNORMAL HIGH (ref 4.8–5.6)
Mean Plasma Glucose: 140 mg/dL

## 2019-05-17 LAB — GLUCOSE, CAPILLARY: Glucose-Capillary: 117 mg/dL — ABNORMAL HIGH (ref 70–99)

## 2019-05-17 MED ORDER — PREDNISONE 10 MG PO TABS: 40.0000 mg | ORAL_TABLET | Freq: Every day | ORAL | 0 refills | Status: DC

## 2019-05-17 MED ORDER — FUROSEMIDE 40 MG PO TABS: 40.0000 mg | ORAL_TABLET | ORAL | 0 refills | Status: DC

## 2019-05-17 NOTE — Telephone Encounter (Signed)
-----   Message from Laverle Hobby, MD sent at 05/17/2019 11:19 AM EDT ----- Regarding: hfu Pt needs hfu in 2-4 weeks for COPD exacerbation.

## 2019-05-17 NOTE — Progress Notes (Signed)
* [facility]     Assessment and Plan:  Acute on chronic hypoxic respiratory failure.  Acute on chronic hypercapnic respiratory failure. Pulmonary edema.  COPD.  Nicotine abuse.  -Patient had rapid improvement after treatment with diuresis, therefore I think of the majority of her problem was acute pulmonary edema, and acute COPD exacerbation appears less likely though it may have still been contributory. - Patient was counseled on the importance of continuing her usual medications particularly her diuretic. - Continue Spiriva outpatient.  Continue albuterol inhaler.  Would consider pulmonary function testing in the future once COVID risk is lower. - Discussed importance of smoke cessation, she does not appear ready to quit at this time. - Patient does appear to have some chronic hypercapnic respiratory failure.  However she does not appear to have significant exacerbations in the past, nor is she interested in considering Pap therapy, therefore will hold off on consideration of BiPAP therapy. -Patient was given my card, will be contacting her to have her follow-up in the office in 2 to 4 weeks.  Date: 05/17/2019  MRN# [id number] ARILLA FLIS 06-02-1946   KEYASHA SLIMMER is a 73 y.o. old female seen in follow up for chief complaint of  Chief Complaint  Patient presents with  . Respiratory Distress     HPI:   Patient has no new complaints today, she notes that her breathing is significantly improved.  She had previously been seen in our office by Dr. Alva Garnet in 2016.  Currently she denies cough, excess mucus production, excess sputum production, shortness of breath.  She feels that her breathing is about back to baseline.  Medication:    Current Facility-Administered Medications:  .  acetaminophen (TYLENOL) tablet 650 mg, 650 mg, Oral, Q6H PRN, 650 mg at 05/16/19 2215 **OR** acetaminophen (TYLENOL) suppository 650 mg, 650 mg, Rectal, Q6H PRN, Fritzi Mandes, MD .   albuterol (PROVENTIL) (2.5 MG/3ML) 0.083% nebulizer solution 2.5 mg, 2.5 mg, Nebulization, Q2H PRN, Fritzi Mandes, MD .  aspirin EC tablet 81 mg, 81 mg, Oral, QHS, Fritzi Mandes, MD, 81 mg at 05/16/19 2216 .  atorvastatin (LIPITOR) tablet 40 mg, 40 mg, Oral, Daily, Fritzi Mandes, MD, 40 mg at 05/17/19 0857 .  carvedilol (COREG) tablet 25 mg, 25 mg, Oral, BID WC, Fritzi Mandes, MD, 25 mg at 05/17/19 0857 .  cyclobenzaprine (FLEXERIL) tablet 10 mg, 10 mg, Oral, TID PRN, Fritzi Mandes, MD, 10 mg at 05/17/19 0857 .  enoxaparin (LOVENOX) injection 40 mg, 40 mg, Subcutaneous, Q24H, Fritzi Mandes, MD, 40 mg at 05/16/19 2216 .  furosemide (LASIX) injection 20 mg, 20 mg, Intravenous, Daily, Flora Lipps, MD, 20 mg at 05/17/19 0857 .  insulin aspart (novoLOG) injection 0-5 Units, 0-5 Units, Subcutaneous, QHS, Patel, Sona, MD .  insulin aspart (novoLOG) injection 0-9 Units, 0-9 Units, Subcutaneous, TID WC, Fritzi Mandes, MD, 2 Units at 05/16/19 1746 .  isosorbide mononitrate (IMDUR) 24 hr tablet 60 mg, 60 mg, Oral, Daily, Fritzi Mandes, MD, 60 mg at 05/17/19 0857 .  multivitamin with minerals tablet 1 tablet, 1 tablet, Oral, Daily, Fritzi Mandes, MD, 1 tablet at 05/17/19 0857 .  nitroGLYCERIN (NITROSTAT) SL tablet 0.4 mg, 0.4 mg, Sublingual, Q5 min PRN, Fritzi Mandes, MD .  ondansetron (ZOFRAN-ODT) disintegrating tablet 4 mg, 4 mg, Oral, Q8H PRN, Fritzi Mandes, MD .  polyethylene glycol (MIRALAX / GLYCOLAX) packet 17 g, 17 g, Oral, Daily PRN, Fritzi Mandes, MD .  predniSONE (DELTASONE) tablet 40 mg, 40 mg, Oral, Q  breakfast, Flora Lipps, MD, 40 mg at 05/17/19 0857 .  sertraline (ZOLOFT) tablet 100 mg, 100 mg, Oral, Daily, Fritzi Mandes, MD, 100 mg at 05/17/19 0857 .  tiotropium (SPIRIVA) inhalation capsule ([facility] use ONLY) 18 mcg, 18 mcg, Inhalation, Daily, Fritzi Mandes, MD, 18 mcg at 05/17/19 0857 .  traZODone (DESYREL) tablet 50 mg, 50 mg, Oral, QHS PRN, Fritzi Mandes, MD, 50 mg at 05/16/19 2221  Current Outpatient Medications:  .   aspirin EC 81 MG tablet, Take 81 mg by mouth at bedtime. , Disp: , Rfl:  .  atorvastatin (LIPITOR) 40 MG tablet, Take 1 tablet (40 mg total) by mouth daily., Disp: 90 tablet, Rfl: 1 .  carvedilol (COREG) 12.5 MG tablet, TAKE 2 TABLETS BY MOUTH TWICE DAILY WITH A MEAL, Disp: 360 tablet, Rfl: 1 .  cyclobenzaprine (FLEXERIL) 10 MG tablet, Take 1 tablet (10 mg total) by mouth 3 (three) times daily as needed for muscle spasms. DO NOT DRIVE ON THIS MEDICINE- WILL MAKE YOU SLEEPY, Disp: 30 tablet, Rfl: 0 .  diphenhydrAMINE (BENADRYL) 25 MG tablet, Take 25 mg by mouth every 6 (six) hours as needed for allergies., Disp: , Rfl:  .  Dulaglutide (TRULICITY) 1.5 0000000 SOPN, Inject 1.5 mg into the skin once a week., Disp: 4 pen, Rfl: 6 .  isosorbide mononitrate (IMDUR) 60 MG 24 hr tablet, Take 1 tablet (60 mg total) by mouth daily., Disp: 90 tablet, Rfl: 1 .  Multiple Vitamin (MULTIVITAMIN WITH MINERALS) TABS tablet, Take 1 tablet by mouth daily., Disp: , Rfl:  .  naproxen (NAPROSYN) 500 MG tablet, Take 1 tablet (500 mg total) by mouth 2 (two) times daily with a meal., Disp: 30 tablet, Rfl: 0 .  nitroGLYCERIN (NITROSTAT) 0.4 MG SL tablet, Place 1 tablet (0.4 mg total) under the tongue every 5 (five) minutes as needed for chest pain., Disp: 10 tablet, Rfl: 12 .  ondansetron (ZOFRAN-ODT) 4 MG disintegrating tablet, Take 4 mg by mouth every 8 (eight) hours as needed for nausea or vomiting., Disp: , Rfl:  .  PROAIR HFA 108 (90 Base) MCG/ACT inhaler, INHALE 1 TO 2 PUFFS BY MOUTH EVERY 4 HOURS AS NEEDED FOR WHEEZING OR SHORTNESS OF BREATH, Disp: 3 g, Rfl: 3 .  sertraline (ZOLOFT) 100 MG tablet, Take 1 tablet (100 mg total) by mouth daily., Disp: 90 tablet, Rfl: 0 .  tiotropium (SPIRIVA HANDIHALER) 18 MCG inhalation capsule, Place 1 capsule (18 mcg total) into inhaler and inhale daily., Disp: 90 capsule, Rfl: 3 .  traZODone (DESYREL) 50 MG tablet, TAKE 1/2 TO 1 (ONE-HALF TO ONE) TABLET BY MOUTH AT BEDTIME AS NEEDED FOR  SLEEP, Disp: 30 tablet, Rfl: 5 .  furosemide (LASIX) 40 MG tablet, Take 1 tablet (40 mg total) by mouth every other day., Disp: 30 tablet, Rfl: 0 .  predniSONE (DELTASONE) 10 MG tablet, Take 4 tablets (40 mg total) by mouth daily with breakfast., Disp: 10 tablet, Rfl: 0   Allergies:  Amlodipine, Codeine, Hctz [hydrochlorothiazide], Lisinopril, and Metformin and related   Review of Systems:  Constitutional: Feels well. Cardiovascular: Denies chest pain, exertional chest pain.  Pulmonary: Denies hemoptysis, pleuritic chest pain.   The remainder of systems were reviewed and were found to be negative other than what is documented in the HPI.   Physical Examination:   VS: BP (!) 155/73 (BP Location: Left Arm)   Pulse 75   Temp 97.8 F (36.6 C) (Oral)   Resp 16   Ht 5\' 4"  (1.626 m)  Wt 81.2 kg   SpO2 93%   BMI 30.73 kg/m   General Appearance: No distress  Neuro:without focal findings, mental status, speech normal, alert and oriented HEENT: PERRLA, EOM intact Pulmonary: No wheezing, No rales  CardiovascularNormal S1,S2.  No m/r/g.  Abdomen: Benign, Soft, non-tender, No masses Renal:  No costovertebral tenderness  GU:  No performed at this time. Endoc: No evident thyromegaly, no signs of acromegaly or Cushing features Skin:   warm, no rashes, no ecchymosis  Extremities: normal, no cyanosis, clubbing.     LABORATORY PANEL:   CBC Recent Labs  Lab 05/16/19 0346  WBC 11.0*  HGB 13.8  HCT 42.8  PLT 150   ------------------------------------------------------------------------------------------------------------------  Chemistries  Recent Labs  Lab 05/16/19 0346  NA 139  K 3.7  CL 101  CO2 28  GLUCOSE 169*  BUN 16  CREATININE 0.72  CALCIUM 9.1   ------------------------------------------------------------------------------------------------------------------  Cardiac Enzymes No results for input(s): TROPONINI in the last 168 hours.  ------------------------------------------------------------  RADIOLOGY:   No results found for this or any previous visit. Results for orders placed during the hospital encounter of 10/16/15  DG Chest 2 View   Narrative CLINICAL DATA:  Shortness of breath over the last 2 days. COPD. Cough.  EXAM: CHEST  2 VIEW  COMPARISON:  07/23/2015  FINDINGS: Heart size is normal. Mediastinal shadows are normal. The lungs are clear except for mild central bronchial thickening. No infiltrate, collapse or effusion. No significant bone finding.  IMPRESSION: Central bronchial thickening.  No consolidation or collapse.   Electronically Signed   By: Nelson Chimes M.D.   On: 10/16/2015 11:19    ------------------------------------------------------------------------------------------------------------------  Thank  you for allowing [facility] to assist in the care of your patient. Our recommendations are noted above.  Please contact us if we can be of further service.   Marda Stalker, M.D., F.C.C.P.  Board Certified in Internal Medicine, Pulmonary Medicine, [place], and Sleep Medicine.  [facility] Office Number: [phone]  05/17/2019

## 2019-05-17 NOTE — Discharge Summary (Signed)
[facility] NAME: Sarah White    MR#:  [id number]  DATE OF BIRTH:  Mar 17, 1946  DATE OF ADMISSION:  05/15/2019 ADMITTING PHYSICIAN: Fritzi Mandes, MD  DATE OF DISCHARGE: 05/17/2019  PRIMARY CARE PHYSICIAN: Valerie Roys, DO    ADMISSION DIAGNOSIS:  COPD exacerbation ([place]) [J44.1] Acute respiratory failure with hypoxia and hypercapnia ([facility]) [J96.01, J96.02]  DISCHARGE DIAGNOSIS:  acute on chronic hypoxic hyper Respiratory failure secondary to COPD exacerbation and mild congestive heart failure acute on chronic systolic  SECONDARY DIAGNOSIS:   Past Medical History:  Diagnosis Date  . Acute pancreatitis 09/09/2018  . Acute respiratory failure with hypoxia and hypercarbia ([place]) 07/22/2015  . Cancer ([place])    uterine  . CHF (congestive heart failure) ([place])   . COPD (chronic obstructive pulmonary disease) ([place])   . Diabetes mellitus without complication ([place])   . Encephalopathy acute 07/22/2015  . Hypertension   . Pneumonia    November 2016  . Pulmonary edema 07/22/2015  . Squamous cell cancer of skin of upper arm, right     HOSPITAL COURSE:   JaniceLloydis a73 y.o.femalewith a known history of COPD with ongoing tobacco abuse not on chronic home oxygen, diabetes, hypertension, pulmonary edema and history of pancreatitis comes to the emergency room after she woke up in the morning with shortness of breath.  1.Acute on chronic hypoxic/Hypercarbicrespiratory failure secondary to COPD exacerbation in the setting of active tobacco abuse with mild pulmonary edema/CHF acute on chronic diastolic -IV Solu-Medrol, IV Lasix.--change to oral steroids -procalcitonin negative. Antibiotics discontinued -inhalers, nebulizer -weaned off BiPAP -- now on 3 L nasal cannula oxygen---weaned to Room air. -Patient reports noncompliance to Lasix at home--- now understands the importance of taking it.  2.Acute on chronic diastolic  congestive heart failure in the setting of respiratory distress -IV Lasix 20 mg daily, monitor input output -urine output 2250 cc  3.Leukocytosis -could be stressed reactive  -came down to 11K  4.Left bundle branch block on EKG -patient denies chest pain. Troponin negative.  -consider cardiology consultation if needed -continue aspirin statins  5.Diabetes type II -sliding scale insulin  6.DVT prophylaxis subcu Lovenox  Overall stable. D/c home--patient is excited to go home!!  CONSULTS OBTAINED:  Treatment Team:  Fritzi Mandes, MD [facility], [facility], MD  DRUG ALLERGIES:   Allergies  Allergen Reactions  . Amlodipine Swelling  . Codeine Nausea And Vomiting  . Hctz [Hydrochlorothiazide] Other (See Comments)    Hypokalemia  . Lisinopril Other (See Comments)    Dizziness   . Metformin And Related Nausea Only    Nausea, dizziness    DISCHARGE MEDICATIONS:   Allergies as of 05/17/2019      Reactions   Amlodipine Swelling   Codeine Nausea And Vomiting   Hctz [hydrochlorothiazide] Other (See Comments)   Hypokalemia   Lisinopril Other (See Comments)   Dizziness   Metformin And Related Nausea Only   Nausea, dizziness      Medication List    TAKE these medications   aspirin EC 81 MG tablet Take 81 mg by mouth at bedtime.   atorvastatin 40 MG tablet Commonly known as: LIPITOR Take 1 tablet (40 mg total) by mouth daily.   carvedilol 12.5 MG tablet Commonly known as: COREG TAKE 2 TABLETS BY MOUTH TWICE DAILY WITH A MEAL   cyclobenzaprine 10 MG tablet Commonly known as: FLEXERIL Take 1 tablet (10 mg total) by mouth 3 (three) times daily as needed for muscle  spasms. DO NOT DRIVE ON THIS MEDICINE- WILL MAKE YOU SLEEPY   diphenhydrAMINE 25 MG tablet Commonly known as: BENADRYL Take 25 mg by mouth every 6 (six) hours as needed for allergies.   furosemide 40 MG tablet Commonly known as: LASIX Take 1 tablet (40 mg total) by mouth every other day.    isosorbide mononitrate 60 MG 24 hr tablet Commonly known as: IMDUR Take 1 tablet (60 mg total) by mouth daily.   multivitamin with minerals Tabs tablet Take 1 tablet by mouth daily.   naproxen 500 MG tablet Commonly known as: Naprosyn Take 1 tablet (500 mg total) by mouth 2 (two) times daily with a meal.   nitroGLYCERIN 0.4 MG SL tablet Commonly known as: Nitrostat Place 1 tablet (0.4 mg total) under the tongue every 5 (five) minutes as needed for chest pain.   ondansetron 4 MG disintegrating tablet Commonly known as: ZOFRAN-ODT Take 4 mg by mouth every 8 (eight) hours as needed for nausea or vomiting.   predniSONE 10 MG tablet Commonly known as: DELTASONE Take 4 tablets (40 mg total) by mouth daily with breakfast.   ProAir HFA 108 (90 Base) MCG/ACT inhaler Generic drug: albuterol INHALE 1 TO 2 PUFFS BY MOUTH EVERY 4 HOURS AS NEEDED FOR WHEEZING OR SHORTNESS OF BREATH   sertraline 100 MG tablet Commonly known as: ZOLOFT Take 1 tablet (100 mg total) by mouth daily.   Spiriva HandiHaler 18 MCG inhalation capsule Generic drug: tiotropium Place 1 capsule (18 mcg total) into inhaler and inhale daily.   traZODone 50 MG tablet Commonly known as: DESYREL TAKE 1/2 TO 1 (ONE-HALF TO ONE) TABLET BY MOUTH AT BEDTIME AS NEEDED FOR SLEEP   Trulicity 1.5 0000000 Sopn Generic drug: Dulaglutide Inject 1.5 mg into the skin once a week.       If you experience worsening of your admission symptoms, develop shortness of breath, life threatening emergency, suicidal or homicidal thoughts you must seek medical attention immediately by calling 911 or calling your MD immediately  if symptoms less severe.  You Must read complete instructions/literature along with all the possible adverse reactions/side effects for all the Medicines you take and that have been prescribed to you. Take any new Medicines after you have completely understood and accept all the possible adverse reactions/side  effects.   Please note  You were cared for by a hospitalist during your hospital stay. If you have any questions about your discharge medications or the care you received while you were in the hospital after you are discharged, you can call the unit and asked to speak with the hospitalist on call if the hospitalist that took care of you is not available. Once you are discharged, your primary care physician will handle any further medical issues. Please note that NO REFILLS for any discharge medications will be authorized once you are discharged, as it is imperative that you return to your primary care physician (or establish a relationship with a primary care physician if you do not have one) for your aftercare needs so that they can reassess your need for medications and monitor your lab values. Today   SUBJECTIVE   Doing so much better  VITAL SIGNS:  Blood pressure (!) 155/73, pulse 75, temperature 97.8 F (36.6 C), temperature source Oral, resp. rate 16, height 5\' 4"  (1.626 m), weight 81.2 kg, SpO2 93 %.  I/O:    Intake/Output Summary (Last 24 hours) at 05/17/2019 0745 Last data filed at 05/16/2019 1611 Gross per 24  hour  Intake 240 ml  Output 550 ml  Net -310 ml    PHYSICAL EXAMINATION:  GENERAL:  73 y.o.-year-old patient lying in the bed with no acute distress. obese EYES: Pupils equal, round, reactive to light and accommodation. No scleral icterus. Extraocular muscles intact.  HEENT: Head atraumatic, normocephalic. Oropharynx and nasopharynx clear.  NECK:  Supple, no jugular venous distention. No thyroid enlargement, no tenderness.  LUNGS: decreased breath sounds bilaterally, no wheezing, rales,rhonchi or crepitation. No use of accessory muscles of respiration.  CARDIOVASCULAR: S1, S2 normal. No murmurs, rubs, or gallops.  ABDOMEN: Soft, non-tender, non-distended. Bowel sounds present. No organomegaly or mass.  EXTREMITIES: No pedal edema, cyanosis, or clubbing.  NEUROLOGIC:  Cranial nerves II through XII are intact. Muscle strength 5/5 in all extremities. Sensation intact. Gait not checked.  PSYCHIATRIC:  patient is alert and oriented x 3.  SKIN: No obvious rash, lesion, or ulcer.   DATA REVIEW:   CBC  Recent Labs  Lab 05/16/19 0346  WBC 11.0*  HGB 13.8  HCT 42.8  PLT 150    Chemistries  Recent Labs  Lab 05/16/19 0346  NA 139  K 3.7  CL 101  CO2 28  GLUCOSE 169*  BUN 16  CREATININE 0.72  CALCIUM 9.1    Microbiology Results   Recent Results (from the past 240 hour(s))  SARS Coronavirus 2 [facility] order, Performed in [facility] hospital lab) Nasopharyngeal Nasopharyngeal Swab     Status: None   Collection Time: 05/15/19  8:16 AM   Specimen: Nasopharyngeal Swab  Result Value Ref Range Status   SARS Coronavirus 2 NEGATIVE NEGATIVE Final    Comment: (NOTE) If result is NEGATIVE SARS-CoV-2 target nucleic acids are NOT DETECTED. The SARS-CoV-2 RNA is generally detectable in upper and lower  respiratory specimens during the acute phase of infection. The lowest  concentration of SARS-CoV-2 viral copies this assay can detect is 250  copies / mL. A negative result does not preclude SARS-CoV-2 infection  and should not be used as the sole basis for treatment or other  patient management decisions.  A negative result may occur with  improper specimen collection / handling, submission of specimen other  than nasopharyngeal swab, presence of viral mutation(s) within the  areas targeted by this assay, and inadequate number of viral copies  (<250 copies / mL). A negative result must be combined with clinical  observations, patient history, and epidemiological information. If result is POSITIVE SARS-CoV-2 target nucleic acids are DETECTED. The SARS-CoV-2 RNA is generally detectable in upper and lower  respiratory specimens dur ing the acute phase of infection.  Positive  results are indicative of active infection with SARS-CoV-2.  Clinical   correlation with patient history and other diagnostic information is  necessary to determine patient infection status.  Positive results do  not rule out bacterial infection or co-infection with other viruses. If result is PRESUMPTIVE POSTIVE SARS-CoV-2 nucleic acids MAY BE PRESENT.   A presumptive positive result was obtained on the submitted specimen  and confirmed on repeat testing.  While 2019 novel coronavirus  (SARS-CoV-2) nucleic acids may be present in the submitted sample  additional confirmatory testing may be necessary for epidemiological  and / or clinical management purposes  to differentiate between  SARS-CoV-2 and other Sarbecovirus currently known to infect humans.  If clinically indicated additional testing with an alternate test  methodology [phone]) is advised. The SARS-CoV-2 RNA is generally  detectable in upper and lower respiratory sp ecimens during the  acute  phase of infection. The expected result is Negative. Fact Sheet for Patients:  StrictlyIdeas.no Fact Sheet for Healthcare Providers: BankingDealers.co.za This test is not yet approved or cleared by the [place] FDA and has been authorized for detection and/or diagnosis of SARS-CoV-2 by FDA under an Emergency Use Authorization (EUA).  This EUA will remain in effect (meaning this test can be used) for the duration of the COVID-19 declaration under Section 564(b)(1) of the Act, 21 U.S.C. section 360bbb-3(b)(1), unless the authorization is terminated or revoked sooner. Performed at [facility], [address] [place], [place] [address]   MRSA PCR Screening     Status: None   Collection Time: 05/15/19 12:02 PM   Specimen: Nasal Mucosa; Nasopharyngeal  Result Value Ref Range Status   MRSA by PCR NEGATIVE NEGATIVE Final    Comment:        The GeneXpert MRSA Assay (FDA approved for NASAL specimens only), is one component of a comprehensive  MRSA colonization surveillance program. It is not intended to diagnose MRSA infection nor to guide or monitor treatment for MRSA infections. Performed at [facility], [address] [place], [place] [address]     RADIOLOGY:  Dg Chest Port 1 View  Result Date: 05/15/2019 CLINICAL DATA:  Shortness of breath EXAM: PORTABLE CHEST 1 VIEW COMPARISON:  09/08/2018 FINDINGS: There is hyperinflation of the lungs compatible with COPD. Cardiomegaly. Interstitial prominence throughout the lungs could reflect early interstitial edema. No effusions or acute bony abnormality. IMPRESSION: COPD. Cardiomegaly with interstitial prominence, question early interstitial edema. Electronically Signed   By: Rolm Baptise M.D.   On: 05/15/2019 08:32     CODE STATUS:     Code Status Orders  (From admission, onward)         Start     Ordered   05/15/19 1118  Do not attempt resuscitation (DNR)  Continuous    Question Answer Comment  In the event of cardiac or respiratory ARREST Do not call a "code blue"   In the event of cardiac or respiratory ARREST Do not perform Intubation, CPR, defibrillation or ACLS   In the event of cardiac or respiratory ARREST Use medication by any route, position, wound care, and other measures to relive pain and suffering. May use oxygen, suction and manual treatment of airway obstruction as needed for comfort.   Comments per pt verbalization to ER MD      05/15/19 1117        Code Status History    Date Active Date Inactive Code Status Order ID Comments User Context   05/15/2019 0814 05/15/2019 1117 DNR [id number]  [name], MD ED   09/09/2018 0637 09/14/2018 2005 Full Code [id number]  Lance Coon, MD ED   11/26/2016 0402 11/27/2016 1810 DNR [id number]  Harvie Bridge, DO Inpatient   03/11/2016 1856 03/12/2016 1303 DNR [id number]  Demetrios Loll, MD Inpatient   07/22/2015 1242 07/25/2015 2149 Partial Code [id number]  Wilhelmina Mcardle, MD Inpatient   07/17/2015  2119 07/22/2015 1242 DNR [id number]  Nicholes Mango, MD Inpatient   Advance Care Planning Activity    Advance Directive Documentation     Most Recent Value  Type of Advance Directive  Out of facility DNR (pink MOST or yellow form)  Pre-existing out of facility DNR order (yellow form or pink MOST form)  -  "MOST" Form in Place?  -      TOTAL TIME TAKING CARE OF THIS PATIENT: *40* minutes.  Fritzi Mandes M.D on 05/17/2019 at 7:45 AM  Between 7am to 6pm - Pager - [phone] After 6pm go to [email or web address] - password [email or web address] [facility]  Office  [phone]  CC: Primary care physician; Valerie Roys, DO

## 2019-05-17 NOTE — Telephone Encounter (Signed)
Left message to schedule HFU.

## 2019-05-18 ENCOUNTER — Telehealth: Payer: Self-pay

## 2019-05-18 NOTE — Telephone Encounter (Signed)
LMOM for pt to call to schedule HFU.

## 2019-05-18 NOTE — Telephone Encounter (Signed)
Transition Care Management Follow-up Telephone Call  Date of discharge and from where: 05/17/2019  How have you been since you were released from the hospital? "a little weak but better"  Any questions or concerns? No   Items Reviewed:  Did the pt receive and understand the discharge instructions provided? Yes   Medications obtained and verified? Yes   Any new allergies since your discharge? No   Dietary orders reviewed? Yes  Do you have support at home? Yes   Functional Questionnaire: (I = Independent and D = Dependent) ADLs:   Bathing/Dressing- i  Meal Prep- i  Eating- i  Maintaining continence- i  Transferring/Ambulation- walker when weak  Managing Meds- d, son helps   Follow up appointments reviewed:   PCP Hospital f/u appt confirmed? Yes  Scheduled to see Dr.Johnson on 05/22/2019 @ 230pm.  [facility] f/u appt confirmed? Yes    Are transportation arrangements needed? No   If their condition worsens, is the pt aware to call PCP or go to the Emergency Dept.? Yes  Was the patient provided with contact information for the PCP's office or ED? Yes  Was to pt encouraged to call back with questions or concerns? Yes

## 2019-05-19 NOTE — Telephone Encounter (Signed)
Left message for pt

## 2019-05-22 ENCOUNTER — Encounter: Payer: Self-pay | Admitting: Family Medicine

## 2019-05-22 ENCOUNTER — Other Ambulatory Visit: Payer: Self-pay

## 2019-05-22 ENCOUNTER — Ambulatory Visit (INDEPENDENT_AMBULATORY_CARE_PROVIDER_SITE_OTHER): Payer: Medicare Other | Admitting: Family Medicine

## 2019-05-22 VITALS — BP 129/87 | HR 82 | Temp 99.1°F | Ht 64.0 in | Wt 178.0 lb

## 2019-05-22 DIAGNOSIS — I129 Hypertensive chronic kidney disease with stage 1 through stage 4 chronic kidney disease, or unspecified chronic kidney disease: Secondary | ICD-10-CM | POA: Diagnosis not present

## 2019-05-22 DIAGNOSIS — D72829 Elevated white blood cell count, unspecified: Secondary | ICD-10-CM

## 2019-05-22 DIAGNOSIS — I5021 Acute systolic (congestive) heart failure: Secondary | ICD-10-CM

## 2019-05-22 DIAGNOSIS — I2510 Atherosclerotic heart disease of native coronary artery without angina pectoris: Secondary | ICD-10-CM | POA: Diagnosis not present

## 2019-05-22 DIAGNOSIS — J449 Chronic obstructive pulmonary disease, unspecified: Secondary | ICD-10-CM | POA: Diagnosis not present

## 2019-05-22 LAB — CBC WITH DIFFERENTIAL/PLATELET
Hematocrit: 45.5 % (ref 34.0–46.6)
Hemoglobin: 15.3 g/dL (ref 11.1–15.9)
Lymphocytes Absolute: 3.3 10*3/uL — ABNORMAL HIGH (ref 0.7–3.1)
Lymphs: 24 %
MCH: 28.5 pg (ref 26.6–33.0)
MCHC: 33.6 g/dL (ref 31.5–35.7)
MCV: 85 fL (ref 79–97)
MID (Absolute): 0.5 10*3/uL (ref 0.1–1.6)
MID: 4 %
Neutrophils Absolute: 9.9 10*3/uL — ABNORMAL HIGH (ref 1.4–7.0)
Neutrophils: 72 %
Platelets: 175 10*3/uL (ref 150–450)
RBC: 5.37 x10E6/uL — ABNORMAL HIGH (ref 3.77–5.28)
RDW: 15.5 % — ABNORMAL HIGH (ref 11.7–15.4)
WBC: 13.7 10*3/uL — ABNORMAL HIGH (ref 3.4–10.8)

## 2019-05-22 MED ORDER — SERTRALINE HCL 100 MG PO TABS: 100.0000 mg | ORAL_TABLET | Freq: Every day | ORAL | 1 refills | Status: DC

## 2019-05-22 MED ORDER — FUROSEMIDE 40 MG PO TABS: 40.0000 mg | ORAL_TABLET | Freq: Every day | ORAL | 1 refills | Status: DC

## 2019-05-22 MED ORDER — TRAZODONE HCL 50 MG PO TABS: ORAL_TABLET | ORAL | 5 refills | Status: DC

## 2019-05-22 NOTE — Progress Notes (Signed)
BP 129/87   Pulse 82   Temp 99.1 F (37.3 C) (Oral)   Ht 5\' 4"  (1.626 m)   Wt 178 lb (80.7 kg)   SpO2 91%   BMI 30.55 kg/m    Subjective:    [name], female    DOB: [birth year], 73 y.o.   MRN: [id number]  HPI: Sarah White is a 73 y.o. female  Chief Complaint  Patient presents with  . Hospitalization Follow-up   Transition of Care Hospital Follow up.   Hospital/Facility: [facility] D/C Physician: Dr. Posey Pronto D/C Date: 05/17/19  Records Requested: 05/22/19 Records Received:  05/22/19 Records Reviewed:  05/22/19  Diagnoses on Discharge: COPD exacerbation with Acute on chronic respiratory failure and mild CHF flare  Date of interactive Contact within 48 hours of discharge: 05/18/19 Contact was through: phone  Date of 7 day or 14 day face-to-face visit: 05/22/19   within 7 days  Outpatient Encounter Medications as of 05/22/2019  Medication Sig  . aspirin EC 81 MG tablet Take 81 mg by mouth at bedtime.   Marland Kitchen atorvastatin (LIPITOR) 40 MG tablet Take 1 tablet (40 mg total) by mouth daily.  . carvedilol (COREG) 12.5 MG tablet TAKE 2 TABLETS BY MOUTH TWICE DAILY WITH A MEAL  . Dulaglutide (TRULICITY) 1.5 0000000 SOPN Inject 1.5 mg into the skin once a week.  . furosemide (LASIX) 40 MG tablet Take 1 tablet (40 mg total) by mouth daily.  . isosorbide mononitrate (IMDUR) 60 MG 24 hr tablet Take 1 tablet (60 mg total) by mouth daily.  . Multiple Vitamin (MULTIVITAMIN WITH MINERALS) TABS tablet Take 1 tablet by mouth daily.  . ondansetron (ZOFRAN-ODT) 4 MG disintegrating tablet Take 4 mg by mouth every 8 (eight) hours as needed for nausea or vomiting.  Marland Kitchen PROAIR HFA 108 (90 Base) MCG/ACT inhaler INHALE 1 TO 2 PUFFS BY MOUTH EVERY 4 HOURS AS NEEDED FOR WHEEZING OR SHORTNESS OF BREATH  . sertraline (ZOLOFT) 100 MG tablet Take 1 tablet (100 mg total) by mouth daily.  Marland Kitchen tiotropium (SPIRIVA HANDIHALER) 18 MCG inhalation capsule Place 1 capsule (18 mcg total) into inhaler and  inhale daily.  . traZODone (DESYREL) 50 MG tablet TAKE 1/2 TO 1 (ONE-HALF TO ONE) TABLET BY MOUTH AT BEDTIME AS NEEDED FOR SLEEP  . [DISCONTINUED] cyclobenzaprine (FLEXERIL) 10 MG tablet Take 1 tablet (10 mg total) by mouth 3 (three) times daily as needed for muscle spasms. DO NOT DRIVE ON THIS MEDICINE- WILL MAKE YOU SLEEPY  . [DISCONTINUED] diphenhydrAMINE (BENADRYL) 25 MG tablet Take 25 mg by mouth every 6 (six) hours as needed for allergies.  . [DISCONTINUED] furosemide (LASIX) 40 MG tablet Take 1 tablet (40 mg total) by mouth every other day.  . [DISCONTINUED] naproxen (NAPROSYN) 500 MG tablet Take 1 tablet (500 mg total) by mouth 2 (two) times daily with a meal.  . [DISCONTINUED] sertraline (ZOLOFT) 100 MG tablet Take 1 tablet (100 mg total) by mouth daily.  . [DISCONTINUED] traZODone (DESYREL) 50 MG tablet TAKE 1/2 TO 1 (ONE-HALF TO ONE) TABLET BY MOUTH AT BEDTIME AS NEEDED FOR SLEEP  . nitroGLYCERIN (NITROSTAT) 0.4 MG SL tablet Place 1 tablet (0.4 mg total) under the tongue every 5 (five) minutes as needed for chest pain.  . [DISCONTINUED] predniSONE (DELTASONE) 10 MG tablet Take 4 tablets (40 mg total) by mouth daily with breakfast.   No facility-administered encounter medications on file as of 05/22/2019.   Per hospitalist: " HOSPITAL COURSE:   JaniceLloydis  a68 y.o.femalewith a known history of COPD with ongoing tobacco abuse not on chronic home oxygen, diabetes, hypertension, pulmonary edema and history of pancreatitis comes to the emergency room after she woke up in the morning with shortness of breath.  1.Acute on chronic hypoxic/Hypercarbicrespiratory failure secondary to COPD exacerbation in the setting of active tobacco abuse with mild pulmonary edema/CHF acute on chronic diastolic -IV Solu-Medrol, IV Lasix.--change to oral steroids -procalcitonin negative. Antibiotics discontinued -inhalers, nebulizer -weanedoff BiPAP --now on 3 L nasal cannula oxygen---weaned to  Room air. -Patient reports noncompliance to Lasix at home--- nowunderstands the importance of taking it.  2.Acute on chronic diastolic congestive heart failure in the setting of respiratory distress -IV Lasix 20 mg daily, monitor input output -urine output 2250 cc  3.Leukocytosis -could be stressed reactive -came down to 11K  4.Left bundle branch block on EKG -patient denies chest pain. Troponin negative.  -consider cardiology consultation if needed -continue aspirin statins  5.Diabetes type II -sliding scale insulin  6.DVT prophylaxis subcu Lovenox  Overall stable. D/c home--patient is excited to go home!!"  Diagnostic Tests Reviewed:   CLINICAL DATA:  Shortness of breath  EXAM: PORTABLE CHEST 1 VIEW  COMPARISON:  09/08/2018  FINDINGS: There is hyperinflation of the lungs compatible with COPD. Cardiomegaly. Interstitial prominence throughout the lungs could reflect early interstitial edema. No effusions or acute bony abnormality.  IMPRESSION: COPD.  Cardiomegaly with interstitial prominence, question early interstitial edema.   ECHOCARDIOGRAM REPORT       Patient Name:   Sarah White Date of Exam: 05/15/2019 Medical Rec #:  [id number]      Height:       64.0 in Accession #:    [id number]     Weight:       179.0 lb Date of Birth:  1945/10/03      BSA:          1.87 m Patient Age:    68 years       BP:           163/100 mmHg Patient Gender: F              HR:           87 bpm. Exam Location:  [facility]    Procedure: 2D Echo and Intracardiac Opacification Agent  Indications:     CHF 428.21/ I50.21   History:         Patient has prior history of Echocardiogram examinations, most                  recent 09/12/2018.   Sonographer:     [place] Referring Phys:  [id number] Flora Lipps Diagnosing Phys: Yolonda Kida MD  IMPRESSIONS    1. The left ventricle has mild-moderately reduced systolic function, with an ejection  fraction of 40-45%. The cavity size was mildly dilated. Left ventricular diastolic Doppler parameters are consistent with impaired relaxation.  2. The right ventricle has mildly reduced systolic function. The cavity was mildly enlarged. There is no increase in right ventricular wall thickness.  3. The aorta is normal unless otherwise noted.  4. The interatrial septum was not assessed.  FINDINGS  Left Ventricle: The left ventricle has mild-moderately reduced systolic function, with an ejection fraction of 40-45%. The cavity size was mildly dilated. There is borderline increase in left ventricular wall thickness. Left ventricular diastolic  Doppler parameters are consistent with impaired relaxation. Definity contrast agent was given IV to delineate the left ventricular endocardial  borders.  Right Ventricle: The right ventricle has mildly reduced systolic function. The cavity was mildly enlarged. There is no increase in right ventricular wall thickness.  Left Atrium: Left atrial size was normal in size.  Right Atrium: Right atrial size was normal in size. Right atrial pressure is estimated at 10 mmHg.  Interatrial Septum: The interatrial septum was not assessed.  Pericardium: There is no evidence of pericardial effusion.  Mitral Valve: The mitral valve is normal in structure. Mitral valve regurgitation is not visualized by color flow Doppler.  Tricuspid Valve: The tricuspid valve is normal in structure. Tricuspid valve regurgitation was not visualized by color flow Doppler.  Aortic Valve: The aortic valve is normal in structure. Aortic valve regurgitation was not visualized by color flow Doppler.  Pulmonic Valve: The pulmonic valve was grossly normal. Pulmonic valve regurgitation is not visualized by color flow Doppler.  Aorta: The aorta is normal unless otherwise noted.    +--------------+--------++ LEFT VENTRICLE              +----------------+----------++  +--------------+--------++      Diastology                 PLAX 2D                     +----------------+----------++ +--------------+--------++      LV e' lateral:  11.60 cm/s LVIDd:        5.15 cm       +----------------+----------++ +--------------+--------++      LV E/e' lateral:11.0       LVIDs:        4.12 cm       +----------------+----------++ +--------------+--------++      LV e' medial:   14.30 cm/s LV PW:        1.36 cm       +----------------+----------++ +--------------+--------++      LV E/e' medial: 9.0        LV IVS:       1.05 cm       +----------------+----------++ +--------------+--------++ LVOT diam:    1.60 cm  +--------------+--------++ LV SV:        52 ml    +--------------+--------++ LV SV Index:  26.54    +--------------+--------++ LVOT Area:    2.01 cm +--------------+--------++                        +--------------+--------++   +------------------+---------++ LV Volumes (MOD)            +------------------+---------++ LV area d, A4C:   41.50 cm +------------------+---------++ LV area s, A4C:   31.10 cm +------------------+---------++ LV major d, A4C:  9.18 cm   +------------------+---------++ LV major s, A4C:  8.05 cm   +------------------+---------++ LV vol d, MOD A4C:151.0 ml  +------------------+---------++ LV vol s, MOD A4C:101.0 ml  +------------------+---------++ LV SV MOD A4C:    151.0 ml  +------------------+---------++  +-------------+-------++-----------++ LEFT ATRIUM         Index       +-------------+-------++-----------++ LA diam:     3.80 cm2.04 cm/m  +-------------+-------++-----------++ LA Vol (A2C):51.9 ml27.81 ml/m +-------------+-------++-----------++ LA Vol (A4C):24.0 ml12.86 ml/m +-------------+-------++-----------++  +------------+-----------++ AORTIC VALVE             +------------+-----------++ LVOT Vmax:  72.70 cm/s  +------------+-----------++ LVOT Vmean: 49.400 cm/s +------------+-----------++ LVOT VTI:   0.144 m     +------------+-----------++   +-------------+-------++ AORTA                +-------------+-------++  Ao Root diam:3.10 cm +-------------+-------++  +--------------+----------++ MITRAL VALVE              +--------------+-------+ +--------------+----------++  SHUNTS                MV Area (PHT):4.60 cm    +--------------+-------+ +--------------+----------++  Systemic VTI: 0.14 m  MV PHT:       47.85 msec  +--------------+-------+ +--------------+----------++  Systemic Diam:1.60 cm MV Decel Time:165 msec    +--------------+-------+ +--------------+----------++ +--------------+-----------++ MV E velocity:128.00 cm/s +--------------+-----------++ MV A velocity:26.90 cm/s  +--------------+-----------++ MV E/A ratio: 4.76        +--------------+-----------++  Disposition: Home  Consults: None  Discharge Instructions: follow up here. May need cardiology referral  Disease/illness Education: Given today in writing  Home Health/Community Services Discussions/Referrals: N/A  Establishment or re-establishment of referral orders for community resources: N/A  Discussion with other health care providers: N/A  Assessment and Support of treatment regimen adherence: Good  Appointments Coordinated with: Patient  Education for self-management, independent living, and ADLs: Discussed today  Since getting out of the hospital, she has been feeling a lot better. She notes that she felt terrible and couldn't breathe- went by ambulance as not being able to breathe and then couldn't remember anything. She has been feeling better. No other concerns or complaints at this time.   Relevant past medical, surgical, family and social history reviewed and updated as indicated. Interim  medical history since our last visit reviewed. Allergies and medications reviewed and updated.  Review of Systems  Constitutional: Negative.   Respiratory: Negative.   Cardiovascular: Negative.   Musculoskeletal: Negative.   Neurological: Negative.   Psychiatric/Behavioral: Negative.     Per HPI unless specifically indicated above     Objective:    BP 129/87   Pulse 82   Temp 99.1 F (37.3 C) (Oral)   Ht 5\' 4"  (1.626 m)   Wt 178 lb (80.7 kg)   SpO2 91%   BMI 30.55 kg/m   Wt Readings from Last 3 Encounters:  05/23/19 177 lb 12.8 oz (80.6 kg)  05/22/19 178 lb (80.7 kg)  05/15/19 179 lb 0.2 oz (81.2 kg)    Physical Exam Vitals signs and nursing note reviewed.  Constitutional:      General: She is not in acute distress.    Appearance: Normal appearance. She is not ill-appearing, toxic-appearing or diaphoretic.  HENT:     Head: Normocephalic and atraumatic.     Right Ear: External ear normal.     Left Ear: External ear normal.     Nose: Nose normal.     Mouth/Throat:     Mouth: Mucous membranes are moist.     Pharynx: Oropharynx is clear.  Eyes:     General: No scleral icterus.       Right eye: No discharge.        Left eye: No discharge.     Extraocular Movements: Extraocular movements intact.     Conjunctiva/sclera: Conjunctivae normal.     Pupils: Pupils are equal, round, and reactive to light.  Neck:     Musculoskeletal: Normal range of motion and neck supple.  Cardiovascular:     Rate and Rhythm: Normal rate and regular rhythm.     Pulses: Normal pulses.     Heart sounds: Normal heart sounds. No murmur. No friction rub. No gallop.   Pulmonary:     Effort: Pulmonary effort is normal. No respiratory distress.     Breath sounds: Normal breath sounds.  No stridor. No wheezing, rhonchi or rales.  Chest:     Chest wall: No tenderness.  Musculoskeletal: Normal range of motion.  Skin:    General: Skin is warm and dry.     Capillary Refill: Capillary refill  takes less than 2 seconds.     Coloration: Skin is not jaundiced or pale.     Findings: No bruising, erythema, lesion or rash.  Neurological:     General: No focal deficit present.     Mental Status: She is alert and oriented to person, place, and time. Mental status is at baseline.  Psychiatric:        Mood and Affect: Mood normal.        Behavior: Behavior normal.        Thought Content: Thought content normal.        Judgment: Judgment normal.     Results for orders placed or performed in visit on [id number]  Basic metabolic panel  Result Value Ref Range   Glucose 99 65 - 99 mg/dL   BUN 16 8 - 27 mg/dL   Creatinine, Ser 0.93 0.57 - 1.00 mg/dL   GFR calc non Af Amer 62 >59 mL/min/1.73   GFR calc Af Amer 72 >59 mL/min/1.73   BUN/Creatinine Ratio 17 12 - 28   Sodium 139 134 - 144 mmol/L   Potassium 3.9 3.5 - 5.2 mmol/L   Chloride 96 96 - 106 mmol/L   CO2 24 20 - 29 mmol/L   Calcium 9.1 8.7 - 10.3 mg/dL  CBC With Differential/Platelet  Result Value Ref Range   WBC 13.7 (H) 3.4 - 10.8 x10E3/uL   RBC 5.37 (H) 3.77 - 5.28 x10E6/uL   Hemoglobin 15.3 11.1 - 15.9 g/dL   Hematocrit 45.5 34.0 - 46.6 %   MCV 85 79 - 97 fL   MCH 28.5 26.6 - 33.0 pg   MCHC 33.6 31.5 - 35.7 g/dL   RDW 15.5 (H) 11.7 - 15.4 %   Platelets 175 150 - 450 x10E3/uL   Neutrophils 72 Not Estab. %   Lymphs 24 Not Estab. %   MID 4 Not Estab. %   Neutrophils Absolute 9.9 (H) 1.4 - 7.0 x10E3/uL   Lymphocytes Absolute 3.3 (H) 0.7 - 3.1 x10E3/uL   MID (Absolute) 0.5 0.1 - 1.6 X10E3/uL      Assessment & Plan:   Problem List Items Addressed This Visit      Cardiovascular and Mediastinum   Acute systolic CHF (congestive heart failure) (HCC)    Euvolemic. Lungs clear today. Continue inhalers and lasix. Call with any concerns. Continue to monitor.       Relevant Medications   furosemide (LASIX) 40 MG tablet     Respiratory   COPD (chronic obstructive pulmonary disease) ([place]) - Primary    Lungs clear today.  Continue inhalers. Call with any concerns. Continue to monitor.         Genitourinary   Hypertensive renal disease    Under good control on current regimen. Continue current regimen. Continue to monitor. Call with any concerns. Refills given.        Relevant Orders   Basic metabolic panel (Completed)    Other Visit Diagnoses    Leukocytosis, unspecified type       Rechecking labs today. Await results. Call with any concerns.    Relevant Orders   CBC With Differential/Platelet (Completed)       Follow up plan: Return As scheduled.

## 2019-05-23 ENCOUNTER — Encounter: Payer: Self-pay | Admitting: Family

## 2019-05-23 ENCOUNTER — Ambulatory Visit: Payer: Medicare Other | Attending: Family | Admitting: Family

## 2019-05-23 ENCOUNTER — Telehealth: Payer: Self-pay | Admitting: Family Medicine

## 2019-05-23 VITALS — BP 143/91 | HR 57 | Temp 98.1°F | Resp 18 | Ht 64.0 in | Wt 177.8 lb

## 2019-05-23 DIAGNOSIS — I2510 Atherosclerotic heart disease of native coronary artery without angina pectoris: Secondary | ICD-10-CM | POA: Diagnosis not present

## 2019-05-23 DIAGNOSIS — E119 Type 2 diabetes mellitus without complications: Secondary | ICD-10-CM | POA: Diagnosis not present

## 2019-05-23 DIAGNOSIS — D72829 Elevated white blood cell count, unspecified: Secondary | ICD-10-CM

## 2019-05-23 DIAGNOSIS — J449 Chronic obstructive pulmonary disease, unspecified: Secondary | ICD-10-CM | POA: Diagnosis not present

## 2019-05-23 DIAGNOSIS — Z888 Allergy status to other drugs, medicaments and biological substances status: Secondary | ICD-10-CM | POA: Diagnosis not present

## 2019-05-23 DIAGNOSIS — D049 Carcinoma in situ of skin, unspecified: Secondary | ICD-10-CM | POA: Insufficient documentation

## 2019-05-23 DIAGNOSIS — Z720 Tobacco use: Secondary | ICD-10-CM

## 2019-05-23 DIAGNOSIS — Z8542 Personal history of malignant neoplasm of other parts of uterus: Secondary | ICD-10-CM | POA: Insufficient documentation

## 2019-05-23 DIAGNOSIS — Z885 Allergy status to narcotic agent status: Secondary | ICD-10-CM | POA: Diagnosis not present

## 2019-05-23 DIAGNOSIS — I509 Heart failure, unspecified: Secondary | ICD-10-CM | POA: Diagnosis not present

## 2019-05-23 DIAGNOSIS — Z955 Presence of coronary angioplasty implant and graft: Secondary | ICD-10-CM | POA: Diagnosis not present

## 2019-05-23 DIAGNOSIS — Z7982 Long term (current) use of aspirin: Secondary | ICD-10-CM | POA: Diagnosis not present

## 2019-05-23 DIAGNOSIS — Z9049 Acquired absence of other specified parts of digestive tract: Secondary | ICD-10-CM | POA: Insufficient documentation

## 2019-05-23 DIAGNOSIS — Z79899 Other long term (current) drug therapy: Secondary | ICD-10-CM | POA: Diagnosis not present

## 2019-05-23 DIAGNOSIS — F17210 Nicotine dependence, cigarettes, uncomplicated: Secondary | ICD-10-CM | POA: Diagnosis not present

## 2019-05-23 DIAGNOSIS — I5022 Chronic systolic (congestive) heart failure: Secondary | ICD-10-CM

## 2019-05-23 DIAGNOSIS — Z8249 Family history of ischemic heart disease and other diseases of the circulatory system: Secondary | ICD-10-CM | POA: Insufficient documentation

## 2019-05-23 DIAGNOSIS — I110 Hypertensive heart disease with heart failure: Secondary | ICD-10-CM | POA: Diagnosis not present

## 2019-05-23 DIAGNOSIS — I10 Essential (primary) hypertension: Secondary | ICD-10-CM

## 2019-05-23 LAB — BASIC METABOLIC PANEL
BUN/Creatinine Ratio: 17 (ref 12–28)
BUN: 16 mg/dL (ref 8–27)
CO2: 24 mmol/L (ref 20–29)
Calcium: 9.1 mg/dL (ref 8.7–10.3)
Chloride: 96 mmol/L (ref 96–106)
Creatinine, Ser: 0.93 mg/dL (ref 0.57–1.00)
GFR calc Af Amer: 72 mL/min/{1.73_m2} (ref >59)
GFR calc non Af Amer: 62 mL/min/{1.73_m2} (ref >59)
Glucose: 99 mg/dL (ref 65–99)
Potassium: 3.9 mmol/L (ref 3.5–5.2)
Sodium: 139 mmol/L (ref 134–144)

## 2019-05-23 NOTE — Progress Notes (Signed)
[name], female    DOB: [birth year], 73 y.o.   MRN: [id number]  HPI  Sarah White is a 73 y/o female with a history of uterine cancer, DM, HTN, COPD, pancreatitis, current tobacco use and chronic heart failure.   Echo report from 05/15/2019 reviewed and showed an EF of 40-45%.  Catheterization done 11/26/16 which showed:  Lat 1st Mrg lesion, 100 %stenosed.  Prox Cx lesion, 75 %stenosed.  Prox Cx to Mid Cx lesion, 100 %stenosed.  Prox LAD lesion, 40 %stenosed.  Prox RCA lesion, 100 %stenosed.   1. Two-vessel coronary artery disease with occluded proximal RCA, an occluded mid left circumflex, with contralateral collaterals to distal RCA 2. Normal left ventricular function  Admitted 05/15/2019 due to COPD/ HF exacerbation. Initially given IV lasix/ solumedrol and then transitioned to oral medications. Weaned off of bipap to room air. Discharged after 2 days.   She presents today for her initial visit with a chief complaint of moderate fatigue upon minimal exertion. She describes this as chronic in nature having been present for several years. She has associated light-headedness, leg pain and easy bruising along with this. She denies any difficulty sleeping, abdominal distention, palpitations, pedal edema, chest pain, shortness of breath, cough or weight gain. Continues to smoke ~ 1 ppd of cigarettes and has no desire to quit at this time.   Past Medical History:  Diagnosis Date  . Acute pancreatitis 09/09/2018  . Acute respiratory failure with hypoxia and hypercarbia ([place]) 07/22/2015  . Cancer ([place])    uterine  . CHF (congestive heart failure) ([place])   . COPD (chronic obstructive pulmonary disease) ([place])   . Diabetes mellitus without complication ([place])   . Encephalopathy acute 07/22/2015  . Hypertension   . Pneumonia    November 2016  . Pulmonary edema 07/22/2015  . Squamous cell cancer of skin of upper arm, right    Past Surgical History:  Procedure Laterality Date   . ABDOMINAL HYSTERECTOMY    . CARPAL TUNNEL RELEASE Bilateral   . CESAREAN SECTION    . CHOLECYSTECTOMY N/A 09/12/2018   Procedure: LAPAROSCOPIC CHOLECYSTECTOMY WITH INTRAOPERATIVE CHOLANGIOGRAM;  Surgeon: Herbert Pun, MD;  Location: [facility] ORS;  Service: General;  Laterality: N/A;  . CORONARY ANGIOPLASTY WITH STENT PLACEMENT    . KNEE SURGERY Left   . LEFT HEART CATH AND CORONARY ANGIOGRAPHY Right 11/26/2016   Procedure: Left Heart Cath and Coronary Angiography;  Surgeon: Isaias Cowman, MD;  Location: [facility];  Service: Cardiovascular;  Laterality: Right;   Family History  Problem Relation Age of Onset  . Heart disease Mother    Social History   Tobacco Use  . Smoking status: Current Every Day Smoker    Packs/day: 0.50    Types: Cigarettes  . Smokeless tobacco: Never Used  Substance Use Topics  . Alcohol use: No   Allergies  Allergen Reactions  . Amlodipine Swelling  . Codeine Nausea And Vomiting  . Hctz [Hydrochlorothiazide] Other (See Comments)    Hypokalemia  . Lisinopril Other (See Comments)    Dizziness   . Metformin And Related Nausea Only    Nausea, dizziness   Prior to Admission medications   Medication Sig Start Date End Date Taking? Authorizing Provider  aspirin EC 81 MG tablet Take 81 mg by mouth at bedtime.    Yes [provider]  atorvastatin (LIPITOR) 40 MG tablet Take 1 tablet (40 mg total) by mouth daily. 02/21/19  Yes Johnson, [place], DO  carvedilol (COREG) 12.5 MG tablet TAKE 2 TABLETS BY MOUTH TWICE DAILY WITH A MEAL 02/21/19  Yes Johnson, [name], DO  Dulaglutide (TRULICITY) 1.5 0000000 SOPN Inject 1.5 mg into the skin once a week. 02/21/19  Yes Johnson, [name], DO  furosemide (LASIX) 40 MG tablet Take 1 tablet (40 mg total) by mouth daily. 05/22/19  Yes Johnson, [name], DO  isosorbide mononitrate (IMDUR) 60 MG 24 hr tablet Take 1 tablet (60 mg total) by mouth daily. 02/21/19  Yes Johnson, [name], DO  Multiple  Vitamin (MULTIVITAMIN WITH MINERALS) TABS tablet Take 1 tablet by mouth daily.   Yes [provider]  nitroGLYCERIN (NITROSTAT) 0.4 MG SL tablet Place 1 tablet (0.4 mg total) under the tongue every 5 (five) minutes as needed for chest pain. 03/24/18  Yes Johnson, [name], DO  ondansetron (ZOFRAN-ODT) 4 MG disintegrating tablet Take 4 mg by mouth every 8 (eight) hours as needed for nausea or vomiting.   Yes [provider]  PROAIR HFA 108 (90 Base) MCG/ACT inhaler INHALE 1 TO 2 PUFFS BY MOUTH EVERY 4 HOURS AS NEEDED FOR WHEEZING OR SHORTNESS OF BREATH 02/21/19  Yes Johnson, [name], DO  sertraline (ZOLOFT) 100 MG tablet Take 1 tablet (100 mg total) by mouth daily. 05/22/19  Yes Johnson, [name], DO  tiotropium (SPIRIVA HANDIHALER) 18 MCG inhalation capsule Place 1 capsule (18 mcg total) into inhaler and inhale daily. 02/21/19  Yes Johnson, [name], DO  traZODone (DESYREL) 50 MG tablet TAKE 1/2 TO 1 (ONE-HALF TO ONE) TABLET BY MOUTH AT BEDTIME AS NEEDED FOR SLEEP 05/22/19  Yes Johnson, [name], DO    Review of Systems  Constitutional: Positive for appetite change (decreased ) and fatigue (tire easily).  HENT: Negative for congestion, postnasal drip and sore throat.   Eyes: Negative.   Respiratory: Negative for cough and shortness of breath.   Cardiovascular: Negative for chest pain, palpitations and leg swelling.  Gastrointestinal: Negative for abdominal distention and abdominal pain.  Endocrine: Negative.   Genitourinary: Negative.   Musculoskeletal: Positive for arthralgias (legs hurting). Negative for neck pain.  Skin: Negative.   Allergic/Immunologic: Negative.   Neurological: Positive for light-headedness. Negative for dizziness.  Hematological: Negative for adenopathy. Bruises/bleeds easily.  Psychiatric/Behavioral: Negative for dysphoric mood and sleep disturbance (sleeping on 1 pillow with HOB elevated). The patient is not nervous/anxious.    Vitals:   05/23/19 1223  BP:  (!) 143/91  Pulse: (!) 57  Resp: 18  Temp: 98.1 F (36.7 C)  TempSrc: Oral  SpO2: (!) 87%  Weight: 177 lb 12.8 oz (80.6 kg)  Height: 5\' 4"  (1.626 m)   Wt Readings from Last 3 Encounters:  05/23/19 177 lb 12.8 oz (80.6 kg)  05/22/19 178 lb (80.7 kg)  05/15/19 179 lb 0.2 oz (81.2 kg)   Lab Results  Component Value Date   CREATININE 0.93 05/22/2019   CREATININE 0.72 05/16/2019   CREATININE 0.81 05/15/2019     Physical Exam Vitals signs and nursing note reviewed.  Constitutional:      Appearance: Normal appearance.  HENT:     Head: Normocephalic and atraumatic.  Neck:     Musculoskeletal: Normal range of motion and neck supple.  Cardiovascular:     Rate and Rhythm: Normal rate and regular rhythm.  Pulmonary:     Effort: Pulmonary effort is normal. No respiratory distress.     Breath sounds: No wheezing or rales.  Abdominal:     General: Abdomen is  flat. There is no distension.     Palpations: Abdomen is soft.  Musculoskeletal:        General: No tenderness.     Right lower leg: Edema (trace pitting) present.     Left lower leg: Edema (trace pitting) present.  Skin:    General: Skin is warm and dry.  Neurological:     General: No focal deficit present.     Mental Status: She is alert and oriented to person, place, and time.  Psychiatric:        Mood and Affect: Mood normal.        Behavior: Behavior normal.        Thought Content: Thought content normal.    Assessment & Plan:  1: Chronic heart failure with mildly reduced ejection fraction- - NYHA class III - euvolemic today - weighing daily and she was reminded to call for an overnight weight gain of >2 pounds or a weekly weight gain of >5 pounds - not adding salt and her son that is with her does most of the cooking and doesn't cook with salt either - EF >40% so would not qualify for entresto or farxiga - BNP 05/15/2019 was 424.0  2: HTN- - BP mildly elevated today but she admits that she was nervous about  coming today - saw PCP Wynetta Emery) 05/22/2019 - BMP 05/22/2019 reviewed and showed sodium 139, potassium 3.9, creatinine 0.93 and GFR 62  3: DM- - A1c 05/16/2019 was 6.5% - glucose at home yesterday was 149  4: Tobacco use- - smoking 1 ppd of cigarettes - says that she has quit in the past but doesn't have a desire to quit at this time - complete cessation discussed for 3 minutes with her  Patient did not bring her medications nor a list. Each medication was verbally reviewed with the patient and she was encouraged to bring the bottles to every visit to confirm accuracy of list.  Return in 2 months or sooner for any questions/problems before then.

## 2019-05-23 NOTE — Patient Instructions (Signed)
Continue weighing daily and call for an overnight weight gain of > 2 pounds or a weekly weight gain of >5 pounds. 

## 2019-05-23 NOTE — Telephone Encounter (Signed)
Pt has been scheduled for HFU on 06/12/2019.

## 2019-05-23 NOTE — Telephone Encounter (Signed)
Patient notified and verbalized understanding. Lab appointment scheduled. 

## 2019-05-23 NOTE — Telephone Encounter (Signed)
Please let her know that her labs look good, but her white blood cell count went up a bit. This is probably from the prednisone, but I want to make sure it goes back to normal, so I'd like her to stop by in a month to recheck it. Order in. Thanks!

## 2019-05-25 ENCOUNTER — Ambulatory Visit: Payer: Medicare Other | Admitting: Family Medicine

## 2019-05-27 ENCOUNTER — Encounter: Payer: Self-pay | Admitting: Family Medicine

## 2019-05-27 NOTE — Assessment & Plan Note (Signed)
Lungs clear today. Continue inhalers. Call with any concerns. Continue to monitor.

## 2019-05-27 NOTE — Assessment & Plan Note (Signed)
Under good control on current regimen. Continue current regimen. Continue to monitor. Call with any concerns. Refills given.   

## 2019-05-27 NOTE — Assessment & Plan Note (Signed)
Euvolemic. Lungs clear today. Continue inhalers and lasix. Call with any concerns. Continue to monitor.

## 2019-06-08 ENCOUNTER — Inpatient Hospital Stay: Payer: Medicare Other | Admitting: Internal Medicine

## 2019-06-12 ENCOUNTER — Inpatient Hospital Stay: Payer: Medicare Other | Admitting: Internal Medicine

## 2019-06-22 ENCOUNTER — Other Ambulatory Visit: Payer: Medicare Other

## 2019-06-26 ENCOUNTER — Other Ambulatory Visit: Payer: Self-pay | Admitting: Family Medicine

## 2019-06-26 ENCOUNTER — Other Ambulatory Visit: Payer: Medicare Other

## 2019-07-21 ENCOUNTER — Telehealth: Payer: Self-pay | Admitting: Family

## 2019-07-21 NOTE — Telephone Encounter (Signed)
Virtual Visit Pre-Appointment Phone Call  "I am calling you today to discuss your upcoming appointment. We are currently trying to limit exposure to the virus that causes COVID-19 by seeing patients at home ."  1. "What is the BEST phone number to call the day of the visit?" - include this in appointment notes  2. "Do you have or have access to (through a family member/friend) a smartphone with video capability that we can use for your visit?" a. If yes - list this number in appt notes as "cell" (if different from BEST phone #) and list the appointment type as a VIDEO visit in appointment notes b. If no - list the appointment type as a PHONE visit in appointment notes  3. Confirm consent - "In the setting of the current Covid19 crisis, you are scheduled for a (phone or video) visit with your provider on (date) at (time).  Just as we do with many in-office visits, in order for you to participate in this visit, we must obtain consent.  If you'd like, I can send this to your mychart (if signed up) or email for you to review.  Otherwise, I can obtain your verbal consent now.  All virtual visits are billed to your insurance company just like a normal visit would be.  By agreeing to a virtual visit, we'd like you to understand that the technology does not allow for your provider to perform an examination, and thus may limit your provider's ability to fully assess your condition. If your provider identifies any concerns that need to be evaluated in person, we will make arrangements to do so.  Finally, though the technology is pretty good, we cannot assure that it will always work on either your or our end, and in the setting of a video visit, we may have to convert it to a phone-only visit.  In either situation, we cannot ensure that we have a secure connection.  Are you willing to proceed?" STAFF: Did the patient verbally acknowledge consent to telehealth visit? Document YES/NO here: YES  4. Advise patient  to be prepared - "Two hours prior to your appointment, go ahead and check your blood pressure, pulse, oxygen saturation, and your weight (if you have the equipment to check those) and write them all down. When your visit starts, your provider will ask you for this information. If you have an Apple Watch or Kardia device, please plan to have heart rate information ready on the day of your appointment. Please have a pen and paper handy nearby the day of the visit as well."  5. Give patient instructions for MyChart download to smartphone OR Doximity/Doxy.me as below if video visit (depending on what platform provider is using)  6. Inform patient they will receive a phone call 15 minutes prior to their appointment time (may be from unknown caller ID) so they should be prepared to answer    TELEPHONE CALL NOTE  TAMIKI HAMMAC has been deemed a candidate for a follow-up tele-health visit to limit community exposure during the Covid-19 pandemic. I spoke with the patient via phone to ensure availability of phone/video source, confirm preferred email & phone number, and discuss instructions and expectations.  I reminded NAYSHA TUFFY to be prepared with any vital sign and/or heart rhythm information that could potentially be obtained via home monitoring, at the time of her visit. I reminded JAELAH LIBERATO to expect a phone call prior to her visit.  Alisa Graff,  FNP 07/21/2019 1:14 PM   INSTRUCTIONS FOR DOWNLOADING THE MYCHART APP TO SMARTPHONE  - The patient must first make sure to have activated MyChart and know their login information - If Apple, go to [employer] and type in MyChart in the search bar and download the app. If Android, ask patient to go to [employer] and type in [place] in the search bar and download the app. The app is free but as with any other app downloads, their phone may require them to verify saved payment information or Apple/Android password.  - The patient will need  to then log into the app with their MyChart username and password, and select [facility] as their healthcare provider to link the account. When it is time for your visit, go to the MyChart app, find appointments, and click Begin Video Visit. Be sure to Select Allow for your device to access the Microphone and Camera for your visit. You will then be connected, and your provider will be with you shortly.  **If they have any issues connecting, or need assistance please contact MyChart service desk [phone])**  **If using a computer, in order to ensure the best quality for their visit they will need to use either of the following Internet Browsers: [employer], or Google Chrome**  IF USING DOXIMITY or DOXY.ME - The patient will receive a link just prior to their visit by text.     FULL LENGTH CONSENT FOR TELE-HEALTH VISIT   I hereby voluntarily request, consent and authorize [facility] and its employed or contracted physicians, physician assistants, nurse practitioners or other licensed health care professionals (the Practitioner), to provide me with telemedicine health care services (the "Services") as deemed necessary by the treating Practitioner. I acknowledge and consent to receive the Services by the Practitioner via telemedicine. I understand that the telemedicine visit will involve communicating with the Practitioner through live audiovisual communication technology and the disclosure of certain medical information by electronic transmission. I acknowledge that I have been given the opportunity to request an in-person assessment or other available alternative prior to the telemedicine visit and am voluntarily participating in the telemedicine visit.  I understand that I have the right to withhold or withdraw my consent to the use of telemedicine in the course of my care at any time, without affecting my right to future care or treatment, and that the Practitioner or I may  terminate the telemedicine visit at any time. I understand that I have the right to inspect all information obtained and/or recorded in the course of the telemedicine visit and may receive copies of available information for a reasonable fee.  I understand that some of the potential risks of receiving the Services via telemedicine include:  Marland Kitchen Delay or interruption in medical evaluation due to technological equipment failure or disruption; . Information transmitted may not be sufficient (e.g. poor resolution of images) to allow for appropriate medical decision making by the Practitioner; and/or  . In rare instances, security protocols could fail, causing a breach of personal health information.  Furthermore, I acknowledge that it is my responsibility to provide information about my medical history, conditions and care that is complete and accurate to the best of my ability. I acknowledge that Practitioner's advice, recommendations, and/or decision may be based on factors not within their control, such as incomplete or inaccurate data provided by me or distortions of diagnostic images or specimens that may result from electronic transmissions. I understand that the practice of  medicine is not an Chief Strategy Officer and that Practitioner makes no warranties or guarantees regarding treatment outcomes. I acknowledge that I will receive a copy of this consent concurrently upon execution via email to the email address I last provided but may also request a printed copy by calling the office of [facility].  I understand that my insurance will be billed for this visit.   I have read or had this consent read to me. . I understand the contents of this consent, which adequately explains the benefits and risks of the Services being provided via telemedicine.  . I have been provided ample opportunity to ask questions regarding this consent and the Services and have had my questions answered to my  satisfaction. . I give my informed consent for the services to be provided through the use of telemedicine in my medical care  By participating in this telemedicine visit I agree to the above.

## 2019-07-24 ENCOUNTER — Ambulatory Visit: Payer: Medicare Other | Attending: Family | Admitting: Family

## 2019-07-24 ENCOUNTER — Encounter: Payer: Self-pay | Admitting: Family

## 2019-07-24 ENCOUNTER — Other Ambulatory Visit: Payer: Self-pay

## 2019-07-24 VITALS — BP 120/74 | Wt 180.0 lb

## 2019-07-24 DIAGNOSIS — I5022 Chronic systolic (congestive) heart failure: Secondary | ICD-10-CM

## 2019-07-24 DIAGNOSIS — Z720 Tobacco use: Secondary | ICD-10-CM

## 2019-07-24 DIAGNOSIS — I10 Essential (primary) hypertension: Secondary | ICD-10-CM

## 2019-07-24 DIAGNOSIS — E119 Type 2 diabetes mellitus without complications: Secondary | ICD-10-CM

## 2019-07-24 NOTE — Progress Notes (Signed)
Virtual Visit via Telephone Note   Evaluation Performed:  Follow-up visit  This visit type was conducted due to national recommendations for restrictions regarding the COVID-19 Pandemic (e.g. social distancing).  This format is felt to be most appropriate for this patient at this time.  All issues noted in this document were discussed and addressed.  No physical exam was performed (except for noted visual exam findings with Video Visits).  Please refer to the patient's chart (MyChart message for video visits and phone note for telephone visits) for the patient's consent to telehealth for [facility]  Date:  07/24/2019   ID:  Sarah White, DOB 10-29-1945, MRN [id number]  Patient Location:  [address] [place] [address]   Provider location:   [facility] [place] [address], [place] [address]  PCP:  Valerie Roys, DO  Electrophysiologist:  None   Chief Complaint:  fatigue  History of Present Illness:    Sarah White is a 73 y.o. female who presents via audio/video conferencing for a telehealth visit today.  Patient verified DOB and address.  The patient does not have symptoms concerning for COVID-19 infection (fever, chills, cough, or new SHORTNESS OF BREATH).   She reports moderate fatigue upon minimal exertion. She describes this as chronic in nature having been present for several years. She has associated shortness of breath, back pain, light-headedness, vomiting and frequent awakening at night. She denies any swelling in her legs/ abdomen, palpitations, chest pain, weight gain or change in her appetite.    Prior CV studies:   The following studies were reviewed today:  Echo report from 05/15/2019 reviewed and showed an EF of 40-45%.   Past Medical History:  Diagnosis Date  . Acute pancreatitis 09/09/2018  . Acute respiratory failure with hypoxia and hypercarbia ([place]) 07/22/2015  . Cancer ([place])    uterine  . CHF (congestive  heart failure) ([place])   . COPD (chronic obstructive pulmonary disease) ([place])   . Diabetes mellitus without complication ([place])   . Encephalopathy acute 07/22/2015  . Hypertension   . Pneumonia    November 2016  . Pulmonary edema 07/22/2015  . 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 ABDOMINAL HYSTERECTOMY    . CARPAL TUNNEL RELEASE Bilateral   . CESAREAN SECTION    . CHOLECYSTECTOMY N/A 09/12/2018   Procedure: LAPAROSCOPIC CHOLECYSTECTOMY WITH INTRAOPERATIVE CHOLANGIOGRAM;  Surgeon: Herbert Pun, MD;  Location: [facility] ORS;  Service: General;  Laterality: N/A;  . CORONARY ANGIOPLASTY WITH STENT PLACEMENT    . KNEE SURGERY Left   . LEFT HEART CATH AND CORONARY ANGIOGRAPHY Right 11/26/2016   Procedure: Left Heart Cath and Coronary Angiography;  Surgeon: Isaias Cowman, MD;  Location: [facility];  Service: Cardiovascular;  Laterality: Right;     Prior to Admission medications   Medication Sig Start Date End Date Taking? Authorizing Provider  aspirin EC 81 MG tablet Take 81 mg by mouth at bedtime.    Yes [provider]  atorvastatin (LIPITOR) 40 MG tablet Take 1 tablet (40 mg total) by mouth daily. 02/21/19  Yes Johnson, [name], DO  carvedilol (COREG) 12.5 MG tablet TAKE 2 TABLETS BY MOUTH TWICE DAILY WITH A MEAL 02/21/19  Yes Johnson, [name], DO  Dulaglutide (TRULICITY) 1.5 0000000 SOPN Inject 1.5 mg into the skin once a week. 02/21/19  Yes Johnson, [place], DO  furosemide (LASIX) 40 MG tablet Take 1 tablet (40 mg total) by mouth daily. 05/22/19  Yes Johnson, [name], DO  isosorbide mononitrate (IMDUR) 60 MG 24 hr tablet Take 1 tablet (60 mg total) by mouth daily. 02/21/19  Yes Johnson, [name], DO  Multiple Vitamin (MULTIVITAMIN WITH MINERALS) TABS tablet Take 1 tablet by mouth daily.   Yes [provider]  nitroGLYCERIN (NITROSTAT) 0.4 MG SL tablet Place 1 tablet (0.4 mg total) under the  tongue every 5 (five) minutes as needed for chest pain. 03/24/18  Yes Johnson, [name], DO  ondansetron (ZOFRAN-ODT) 4 MG disintegrating tablet Take 4 mg by mouth every 8 (eight) hours as needed for nausea or vomiting.   Yes [provider]  sertraline (ZOLOFT) 100 MG tablet Take 1 tablet (100 mg total) by mouth daily. 05/22/19  Yes Johnson, [name], DO  tiotropium (SPIRIVA HANDIHALER) 18 MCG inhalation capsule Place 1 capsule (18 mcg total) into inhaler and inhale daily. 02/21/19  Yes Johnson, [name], DO  traZODone (DESYREL) 50 MG tablet TAKE 1/2 TO 1 (ONE-HALF TO ONE) TABLET BY MOUTH AT BEDTIME AS NEEDED FOR SLEEP 05/22/19  Yes Johnson, [name], DO      Allergies:   Amlodipine, Codeine, Hctz [hydrochlorothiazide], Lisinopril, and Metformin and related   Social History   Tobacco Use  . Smoking status: Current Every Day Smoker    Packs/day: 0.50    Types: Cigarettes  . Smokeless tobacco: Never Used  Substance Use Topics  . Alcohol use: No  . Drug use: No     Family Hx: The patient's family history includes Heart disease in her mother.  ROS:   Please see the history of present illness.     All other systems reviewed and are negative.   Labs/Other Tests and Data Reviewed:    Recent Labs: 02/15/2019: ALT 16 05/15/2019: B Natriuretic Peptide 424.0 05/22/2019: BUN 16; Creatinine, Ser 0.93; Hemoglobin 15.3; Platelets 175; Potassium 3.9; Sodium 139   Recent Lipid Panel Lab Results  Component Value Date/Time   CHOL 166 02/15/2019 03:01 PM   CHOL 164 02/04/2016 10:43 AM   CHOL 258 (H) 01/16/2014 12:00 PM   TRIG 269 (H) 02/15/2019 03:01 PM   TRIG 258 (H) 02/04/2016 10:43 AM   TRIG 337 (H) 01/16/2014 12:00 PM   HDL 36 (L) 02/15/2019 03:01 PM   HDL 35 (L) 01/16/2014 12:00 PM   CHOLHDL 4.3 11/26/2016 08:10 AM   LDLCALC 76 02/15/2019 03:01 PM   LDLCALC 156 (H) 01/16/2014 12:00 PM    Wt Readings from Last 3 Encounters:  05/23/19 177 lb 12.8 oz (80.6 kg)  05/22/19 178 lb  (80.7 kg)  05/15/19 179 lb 0.2 oz (81.2 kg)     Exam:    Vital Signs:  There were no vitals taken for this visit.   Well nourished, well developed female in no  acute distress.   ASSESSMENT & PLAN:    1.  Chronic heart failure with mildly reduced ejection fraction- - NYHA class III - euvolemic today based on patient's description of symptoms - weighing daily and she was reminded to call for an overnight weight gain of >2 pounds or a weekly weight gain of >5 pounds - says that her home weight has been stable ~ 180 pounds - not adding salt and says that she doesn't cook with salt either; says that she can really taste the salt in foods - EF >40% so would not qualify for entresto or farxiga -  BNP 05/15/2019 was 424.0  2: HTN- - self-reported BP is good today - saw PCP Wynetta Emery) 05/22/2019 - BMP 05/22/2019 reviewed and showed sodium 139, potassium 3.9, creatinine 0.93 and GFR 62  3: DM- - A1c 05/16/2019 was 6.5% - she didn't check her glucose yet today  4: Tobacco use- - smoking 1 ppd of cigarettes - says that she has quit in the past but doesn't have a desire to quit at this time - complete cessation discussed for 3 minutes with her  COVID-19 Education: The signs and symptoms of COVID-19 were discussed with the patient and how to seek care for testing (follow up with PCP or arrange E-visit).  The importance of social distancing was discussed today.  Patient Risk:   After full review of this patients clinical status, I feel that they are at least moderate risk at this time.  Time:   Today, I have spent 14 minutes with the patient with telehealth technology discussing medications, weight and symptoms to report.     Medication Adjustments/Labs and Tests Ordered: Current medicines are reviewed at length with the patient today.  Concerns regarding medicines are outlined above.   Tests Ordered: No orders of the defined types were placed in this encounter.  Medication  Changes: No orders of the defined types were placed in this encounter.   Disposition:  Follow-up in 4 months or sooner for any questions/problems before then.   Signed, Alisa Graff, FNP  07/24/2019 1:26 PM    [facility]

## 2019-07-24 NOTE — Patient Instructions (Signed)
Continue weighing daily and call for an overnight weight gain of > 2 pounds or a weekly weight gain of >5 pounds. 

## 2019-08-21 ENCOUNTER — Encounter: Payer: Self-pay | Admitting: Family Medicine

## 2019-08-21 ENCOUNTER — Other Ambulatory Visit: Payer: Self-pay

## 2019-08-21 ENCOUNTER — Ambulatory Visit (INDEPENDENT_AMBULATORY_CARE_PROVIDER_SITE_OTHER): Payer: Medicare Other | Admitting: Family Medicine

## 2019-08-21 VITALS — BP 134/77 | HR 85 | Wt 179.0 lb

## 2019-08-21 DIAGNOSIS — E1122 Type 2 diabetes mellitus with diabetic chronic kidney disease: Secondary | ICD-10-CM | POA: Diagnosis not present

## 2019-08-21 DIAGNOSIS — I2510 Atherosclerotic heart disease of native coronary artery without angina pectoris: Secondary | ICD-10-CM | POA: Diagnosis not present

## 2019-08-21 DIAGNOSIS — N183 Chronic kidney disease, stage 3 unspecified: Secondary | ICD-10-CM

## 2019-08-21 DIAGNOSIS — E782 Mixed hyperlipidemia: Secondary | ICD-10-CM

## 2019-08-21 DIAGNOSIS — I129 Hypertensive chronic kidney disease with stage 1 through stage 4 chronic kidney disease, or unspecified chronic kidney disease: Secondary | ICD-10-CM

## 2019-08-21 DIAGNOSIS — I951 Orthostatic hypotension: Secondary | ICD-10-CM | POA: Diagnosis not present

## 2019-08-21 DIAGNOSIS — J449 Chronic obstructive pulmonary disease, unspecified: Secondary | ICD-10-CM

## 2019-08-21 DIAGNOSIS — F321 Major depressive disorder, single episode, moderate: Secondary | ICD-10-CM

## 2019-08-21 MED ORDER — ATORVASTATIN CALCIUM 40 MG PO TABS: 40.0000 mg | ORAL_TABLET | Freq: Every day | ORAL | 1 refills | Status: DC

## 2019-08-21 MED ORDER — CYCLOBENZAPRINE HCL 10 MG PO TABS: 10.0000 mg | ORAL_TABLET | Freq: Three times a day (TID) | ORAL | 0 refills | Status: DC | PRN

## 2019-08-21 MED ORDER — ISOSORBIDE MONONITRATE ER 60 MG PO TB24: 60.0000 mg | ORAL_TABLET | Freq: Every day | ORAL | 1 refills | Status: DC

## 2019-08-21 MED ORDER — TRIAMCINOLONE ACETONIDE 0.5 % EX OINT: 1.0000 "application " | TOPICAL_OINTMENT | Freq: Two times a day (BID) | CUTANEOUS | 3 refills | Status: DC

## 2019-08-21 MED ORDER — TRULICITY 1.5 MG/0.5ML ~~LOC~~ SOAJ: 1.5000 mg | SUBCUTANEOUS | 6 refills | Status: DC

## 2019-08-21 MED ORDER — SERTRALINE HCL 100 MG PO TABS: 200.0000 mg | ORAL_TABLET | Freq: Every day | ORAL | 1 refills | Status: DC

## 2019-08-21 MED ORDER — CARVEDILOL 12.5 MG PO TABS: ORAL_TABLET | ORAL | 1 refills | Status: DC

## 2019-08-21 MED ORDER — TRIAMCINOLONE ACETONIDE 0.5 % EX OINT: 1.0000 "application " | TOPICAL_OINTMENT | Freq: Two times a day (BID) | CUTANEOUS | 0 refills | Status: DC

## 2019-08-21 NOTE — Assessment & Plan Note (Signed)
Tolerating medicine well. Due for recheck on her labs. Will come in tomorrow. Continue current regimen. Continue to monitor. Call with any concerns.

## 2019-08-21 NOTE — Progress Notes (Signed)
BP 134/77   Pulse 85   Wt 179 lb (81.2 kg)   SpO2 94%   BMI 30.73 kg/m    Subjective:    [name], female    DOB: 04/09/1946, 73 y.o.   MRN: [id number]  HPI: Sarah White is a 73 y.o. female  Chief Complaint  Patient presents with  . Medication Refill    triamcinolone and flexeril   HYPERTENSION / HYPERLIPIDEMIA Satisfied with current treatment? yes Duration of hypertension: chronic BP monitoring frequency: not checking BP medication side effects: no Past BP meds: carvedilol, imdur, lasix Duration of hyperlipidemia: chronic Cholesterol medication side effects: no Cholesterol supplements: none Past cholesterol medications: atorvastatin Medication compliance: excellent compliance Aspirin: yes Recent stressors: yes Recurrent headaches: no Visual changes: no Palpitations: no Dyspnea: no Chest pain: no Lower extremity edema: no Dizzy/lightheaded: no  DIABETES Hypoglycemic episodes:no Polydipsia/polyuria: no Visual disturbance: yes Chest pain: no Paresthesias: yes Glucose Monitoring: yes  Accucheck frequency: Daily  Fasting glucose: 100-140s  Post prandial: 154 Taking Insulin?: no Blood Pressure Monitoring: not checking Retinal Examination: Up to Date Foot Exam: Not up to Date Diabetic Education: Completed Pneumovax: Up to Date Influenza: Up to Date Aspirin: yes  DEPRESSION Mood status: worse Satisfied with current treatment?: no Symptom severity: moderate  Duration of current treatment : chronic Side effects: no Medication compliance: excellent compliance Psychotherapy/counseling: no  Previous psychiatric medications: zoloft Depressed mood: yes Anxious mood: yes Anhedonia: no Significant weight loss or gain: no Insomnia: no  Fatigue: yes Feelings of worthlessness or guilt: yes Impaired concentration/indecisiveness: yes Suicidal ideations: no Hopelessness: no Crying spells: yes Depression screen [facility] 2/9 08/21/2019  02/15/2019 05/05/2018 03/24/2018 09/30/2017  Decreased Interest 0 0 0 2 1  Down, Depressed, Hopeless 1 0 0 2 0  PHQ - 2 Score 1 0 0 4 1  Altered sleeping 0 - 3 2 1   Tired, decreased energy 1 - 1 2 1   Change in appetite 0 - 1 2 0  Feeling bad or failure about yourself  1 - 0 2 1  Trouble concentrating 3 - 0 2 0  Moving slowly or fidgety/restless 0 - 0 0 0  Suicidal thoughts 0 - 0 0 0  PHQ-9 Score 6 - 5 14 4   Difficult doing work/chores Somewhat difficult - Somewhat difficult Somewhat difficult Not difficult at all   COPD COPD status: stable Satisfied with current treatment?: yes Oxygen use: no Dyspnea frequency: occasionally Cough frequency: occasionally Rescue inhaler frequency:  daily Limitation of activity: yes Productive cough: no Pneumovax: Up to Date Influenza: Up to Date  Relevant past medical, surgical, family and social history reviewed and updated as indicated. Interim medical history since our last visit reviewed. Allergies and medications reviewed and updated.  Review of Systems  Constitutional: Negative.   HENT: Negative.   Respiratory: Positive for cough and wheezing. Negative for apnea, choking, chest tightness, shortness of breath and stridor.   Cardiovascular: Negative.   Gastrointestinal: Negative.   Psychiatric/Behavioral: Positive for dysphoric mood. Negative for agitation, behavioral problems, confusion, decreased concentration, hallucinations, self-injury, sleep disturbance and suicidal ideas. The patient is nervous/anxious. The patient is not hyperactive.     Per HPI unless specifically indicated above     Objective:    BP 134/77   Pulse 85   Wt 179 lb (81.2 kg)   SpO2 94%   BMI 30.73 kg/m   Wt Readings from Last 3 Encounters:  08/21/19 179 lb (81.2 kg)  07/24/19 180 lb (81.6  kg)  05/23/19 177 lb 12.8 oz (80.6 kg)    Physical Exam Vitals and nursing note reviewed.  Pulmonary:     Effort: Pulmonary effort is normal. No respiratory distress.      Comments: Speaking in full sentences Neurological:     Mental Status: She is alert.  Psychiatric:        Mood and Affect: Mood normal.        Behavior: Behavior normal.        Thought Content: Thought content normal.        Judgment: Judgment normal.     Results for orders placed or performed in visit on 93/23/55  Basic metabolic panel  Result Value Ref Range   Glucose 99 65 - 99 mg/dL   BUN 16 8 - 27 mg/dL   Creatinine, Ser 0.93 0.57 - 1.00 mg/dL   GFR calc non Af Amer 62 >59 mL/min/1.73   GFR calc Af Amer 72 >59 mL/min/1.73   BUN/Creatinine Ratio 17 12 - 28   Sodium 139 134 - 144 mmol/L   Potassium 3.9 3.5 - 5.2 mmol/L   Chloride 96 96 - 106 mmol/L   CO2 24 20 - 29 mmol/L   Calcium 9.1 8.7 - 10.3 mg/dL  CBC With Differential/Platelet  Result Value Ref Range   WBC 13.7 (H) 3.4 - 10.8 x10E3/uL   RBC 5.37 (H) 3.77 - 5.28 x10E6/uL   Hemoglobin 15.3 11.1 - 15.9 g/dL   Hematocrit 45.5 34.0 - 46.6 %   MCV 85 79 - 97 fL   MCH 28.5 26.6 - 33.0 pg   MCHC 33.6 31.5 - 35.7 g/dL   RDW 15.5 (H) 11.7 - 15.4 %   Platelets 175 150 - 450 x10E3/uL   Neutrophils 72 Not Estab. %   Lymphs 24 Not Estab. %   MID 4 Not Estab. %   Neutrophils Absolute 9.9 (H) 1.4 - 7.0 x10E3/uL   Lymphocytes Absolute 3.3 (H) 0.7 - 3.1 x10E3/uL   MID (Absolute) 0.5 0.1 - 1.6 X10E3/uL      Assessment & Plan:   Problem List Items Addressed This Visit      Cardiovascular and Mediastinum   CAD (coronary artery disease) - Primary    No pain. Doing well. Continue to keep BP and cholesterol under good control. Call with any concerns.       Relevant Medications   isosorbide mononitrate (IMDUR) 60 MG 24 hr tablet   carvedilol (COREG) 12.5 MG tablet   atorvastatin (LIPITOR) 40 MG tablet   Other Relevant Orders   CBC with Differential OUT   Comp Met (CMET)   Orthostatic hypotension    No issues currently. Continue current regimen. Continue to monitor.       Relevant Medications   isosorbide mononitrate  (IMDUR) 60 MG 24 hr tablet   carvedilol (COREG) 12.5 MG tablet   atorvastatin (LIPITOR) 40 MG tablet   Other Relevant Orders   CBC with Differential OUT   Comp Met (CMET)     Respiratory   COPD (chronic obstructive pulmonary disease) (HCC)    Under good control on current regimen. Continue current regimen. Continue to monitor. Call with any concerns. Refills given.        Relevant Orders   CBC with Differential OUT   Comp Met (CMET)     Endocrine   Diabetes mellitus with chronic kidney disease ([place])    Tolerating medicine well. Due for recheck on her labs. Will come in tomorrow. Continue current regimen.  Continue to monitor. Call with any concerns.       Relevant Medications   Dulaglutide (TRULICITY) 1.5 HH/8.3UP SOPN   atorvastatin (LIPITOR) 40 MG tablet   Other Relevant Orders   Bayer DCA Hb A1c Waived   CBC with Differential OUT   Comp Met (CMET)   Microalbumin, Urine Waived     Genitourinary   Hypertensive renal disease    Under good control on current regimen. Continue current regimen. Continue to monitor. Call with any concerns. Refills given. Labs to be drawn tomorrow.        Relevant Orders   CBC with Differential OUT   Comp Met (CMET)   Microalbumin, Urine Waived     Other   Hyperlipidemia    Under good control on current regimen. Continue current regimen. Continue to monitor. Call with any concerns. Refills given. Labs to be drawn tomorrow.        Relevant Medications   isosorbide mononitrate (IMDUR) 60 MG 24 hr tablet   carvedilol (COREG) 12.5 MG tablet   atorvastatin (LIPITOR) 40 MG tablet   Other Relevant Orders   CBC with Differential OUT   Comp Met (CMET)   Lipid Panel w/o Chol/HDL Ratio OUT   Depression, major, single episode, moderate (HCC)    Not doing great on her mood. Will increase her sertraline to 228m and recheck 1 month. Call with any concerns.       Relevant Medications   sertraline (ZOLOFT) 100 MG tablet   Other Relevant Orders    CBC with Differential OUT   Comp Met (CMET)       Follow up plan: Return in about 4 weeks (around 09/18/2019).   . This visit was completed via telephone due to the restrictions of the COVID-19 pandemic. All issues as above were discussed and addressed but no physical exam was performed. If it was felt that the patient should be evaluated in the office, they were directed there. The patient verbally consented to this visit. Patient was unable to complete an audio/visual visit due to Lack of equipment. Due to the catastrophic nature of the COVID-19 pandemic, this visit was done through audio contact only. . Location of the patient: home . Location of the provider: work . Those involved with this call:  . Provider: MPark Liter DO . CMA: Tiffany Reel, CMA . Front Desk/Registration: CDon Perking . Time spent on call: 25 minutes on the phone discussing health concerns. 40 minutes total spent in review of patient's record and preparation of their chart.

## 2019-08-21 NOTE — Assessment & Plan Note (Signed)
Under good control on current regimen. Continue current regimen. Continue to monitor. Call with any concerns. Refills given. Labs to be drawn tomorrow.

## 2019-08-21 NOTE — Assessment & Plan Note (Signed)
No pain. Doing well. Continue to keep BP and cholesterol under good control. Call with any concerns.

## 2019-08-21 NOTE — Assessment & Plan Note (Signed)
No issues currently. Continue current regimen. Continue to monitor.

## 2019-08-21 NOTE — Assessment & Plan Note (Signed)
Under good control on current regimen. Continue current regimen. Continue to monitor. Call with any concerns. Refills given.   

## 2019-08-21 NOTE — Assessment & Plan Note (Signed)
Not doing great on her mood. Will increase her sertraline to 200mg  and recheck 1 month. Call with any concerns.

## 2019-08-22 ENCOUNTER — Other Ambulatory Visit: Payer: Medicare Other

## 2019-08-22 ENCOUNTER — Other Ambulatory Visit: Payer: Self-pay

## 2019-08-22 DIAGNOSIS — I951 Orthostatic hypotension: Secondary | ICD-10-CM

## 2019-08-22 DIAGNOSIS — E1122 Type 2 diabetes mellitus with diabetic chronic kidney disease: Secondary | ICD-10-CM

## 2019-08-22 DIAGNOSIS — I129 Hypertensive chronic kidney disease with stage 1 through stage 4 chronic kidney disease, or unspecified chronic kidney disease: Secondary | ICD-10-CM

## 2019-08-22 DIAGNOSIS — J449 Chronic obstructive pulmonary disease, unspecified: Secondary | ICD-10-CM

## 2019-08-22 DIAGNOSIS — E782 Mixed hyperlipidemia: Secondary | ICD-10-CM

## 2019-08-22 DIAGNOSIS — N183 Chronic kidney disease, stage 3 unspecified: Secondary | ICD-10-CM

## 2019-08-22 DIAGNOSIS — F321 Major depressive disorder, single episode, moderate: Secondary | ICD-10-CM

## 2019-08-22 DIAGNOSIS — I2510 Atherosclerotic heart disease of native coronary artery without angina pectoris: Secondary | ICD-10-CM

## 2019-08-22 LAB — MICROALBUMIN, URINE WAIVED
Creatinine, Urine Waived: 50 mg/dL (ref 10–300)
Microalb, Ur Waived: 10 mg/L (ref 0–19)
Microalb/Creat Ratio: <30 mg/g (ref <30)

## 2019-08-22 LAB — BAYER DCA HB A1C WAIVED: HB A1C (BAYER DCA - WAIVED): 5.9 % (ref <7.0)

## 2019-08-23 LAB — COMPREHENSIVE METABOLIC PANEL
ALT: 13 IU/L (ref 0–32)
AST: 14 IU/L (ref 0–40)
Albumin/Globulin Ratio: 2.2 (ref 1.2–2.2)
Albumin: 4.2 g/dL (ref 3.7–4.7)
Alkaline Phosphatase: 118 IU/L — ABNORMAL HIGH (ref 39–117)
BUN/Creatinine Ratio: 11 — ABNORMAL LOW (ref 12–28)
BUN: 9 mg/dL (ref 8–27)
Bilirubin Total: 0.4 mg/dL (ref 0.0–1.2)
CO2: 26 mmol/L (ref 20–29)
Calcium: 9.1 mg/dL (ref 8.7–10.3)
Chloride: 102 mmol/L (ref 96–106)
Creatinine, Ser: 0.83 mg/dL (ref 0.57–1.00)
GFR calc Af Amer: 82 mL/min/{1.73_m2} (ref >59)
GFR calc non Af Amer: 71 mL/min/{1.73_m2} (ref >59)
Globulin, Total: 1.9 g/dL (ref 1.5–4.5)
Glucose: 98 mg/dL (ref 65–99)
Potassium: 4.1 mmol/L (ref 3.5–5.2)
Sodium: 142 mmol/L (ref 134–144)
Total Protein: 6.1 g/dL (ref 6.0–8.5)

## 2019-08-23 LAB — CBC WITH DIFFERENTIAL/PLATELET
Basophils Absolute: 0.1 10*3/uL (ref 0.0–0.2)
Basos: 1 %
EOS (ABSOLUTE): 0.2 10*3/uL (ref 0.0–0.4)
Eos: 2 %
Hematocrit: 42 % (ref 34.0–46.6)
Hemoglobin: 14 g/dL (ref 11.1–15.9)
Immature Grans (Abs): 0 10*3/uL (ref 0.0–0.1)
Immature Granulocytes: 0 %
Lymphocytes Absolute: 1.6 10*3/uL (ref 0.7–3.1)
Lymphs: 21 %
MCH: 27.7 pg (ref 26.6–33.0)
MCHC: 33.3 g/dL (ref 31.5–35.7)
MCV: 83 fL (ref 79–97)
Monocytes Absolute: 0.5 10*3/uL (ref 0.1–0.9)
Monocytes: 6 %
Neutrophils Absolute: 5.6 10*3/uL (ref 1.4–7.0)
Neutrophils: 70 %
Platelets: 151 10*3/uL (ref 150–450)
RBC: 5.06 x10E6/uL (ref 3.77–5.28)
RDW: 14.1 % (ref 11.7–15.4)
WBC: 7.9 10*3/uL (ref 3.4–10.8)

## 2019-08-23 LAB — LIPID PANEL W/O CHOL/HDL RATIO
Cholesterol, Total: 154 mg/dL (ref 100–199)
HDL: 34 mg/dL — ABNORMAL LOW (ref >39)
LDL Chol Calc (NIH): 87 mg/dL (ref 0–99)
Triglycerides: 195 mg/dL — ABNORMAL HIGH (ref 0–149)
VLDL Cholesterol Cal: 33 mg/dL (ref 5–40)

## 2019-09-20 ENCOUNTER — Encounter: Payer: Self-pay | Admitting: Family Medicine

## 2019-09-20 ENCOUNTER — Other Ambulatory Visit: Payer: Self-pay

## 2019-09-20 ENCOUNTER — Ambulatory Visit (INDEPENDENT_AMBULATORY_CARE_PROVIDER_SITE_OTHER): Payer: Medicare Other | Admitting: Family Medicine

## 2019-09-20 DIAGNOSIS — F321 Major depressive disorder, single episode, moderate: Secondary | ICD-10-CM

## 2019-09-20 MED ORDER — BUDESONIDE-FORMOTEROL FUMARATE 160-4.5 MCG/ACT IN AERO: 2.0000 | INHALATION_SPRAY | Freq: Two times a day (BID) | RESPIRATORY_TRACT | 3 refills | Status: DC

## 2019-09-20 MED ORDER — ALBUTEROL SULFATE HFA 108 (90 BASE) MCG/ACT IN AERS: 2.0000 | INHALATION_SPRAY | Freq: Four times a day (QID) | RESPIRATORY_TRACT | 6 refills | Status: DC | PRN

## 2019-09-20 MED ORDER — ROSUVASTATIN CALCIUM 20 MG PO TABS: 20.0000 mg | ORAL_TABLET | Freq: Every day | ORAL | 1 refills | Status: DC

## 2019-09-20 NOTE — Patient Instructions (Signed)
We are recommending the vaccine to everyone who has not had an allergic reaction to any of the components of the vaccine. If you have specific questions about the vaccine, please bring them up with your health care provider to discuss them.   We will likely not be getting the vaccine in the office for the first rounds of vaccinations. The way they are releasing the vaccines is going to be through the health systems (like [facility], [facility], [facility], [facility]) or through your county health department.   The [facility] is giving vaccines to those 75+ starting 09/06/19  M-F 7AM to 4PM [facility] [address], [place], [place] [facility] in a drive through tent  If you are 65+ you can get a vaccine through [facility] by signing up for an appointment.  You can sign up by going to: [email or web address].  You can get more information by going to: [email or web address].ncdhhs.gov/[email or web address] 

## 2019-09-20 NOTE — Progress Notes (Signed)
BP (!) 155/90   Pulse 86   Wt 178 lb (80.7 kg)   BMI 30.55 kg/m    Subjective:    [name], female    DOB: 1946/05/24, 73 y.o.   MRN: [id number]  HPI: Sarah White is a 74 y.o. female  Chief Complaint  Patient presents with  . Depression   DEPRESSION- has been having weakness in her hands and that is making her feel very furstrated Mood status: better Satisfied with current treatment?: yes Symptom severity: mild  Duration of current treatment : chronic Side effects: no Medication compliance: excellent compliance Psychotherapy/counseling: no  Depressed mood: yes Anxious mood: yes Anhedonia: no Significant weight loss or gain: no Insomnia: no  Fatigue: yes Feelings of worthlessness or guilt: no Impaired concentration/indecisiveness: no Suicidal ideations: no Hopelessness: no Crying spells: no Depression screen [facility] 2/9 09/20/2019 08/21/2019 02/15/2019 05/05/2018 03/24/2018  Decreased Interest 0 0 0 0 2  Down, Depressed, Hopeless 0 1 0 0 2  PHQ - 2 Score 0 1 0 0 4  Altered sleeping 3 0 - 3 2  Tired, decreased energy 1 1 - 1 2  Change in appetite 0 0 - 1 2  Feeling bad or failure about yourself  0 1 - 0 2  Trouble concentrating 0 3 - 0 2  Moving slowly or fidgety/restless 0 0 - 0 0  Suicidal thoughts 0 0 - 0 0  PHQ-9 Score 4 6 - 5 14  Difficult doing work/chores Not difficult at all Somewhat difficult - Somewhat difficult Somewhat difficult   Relevant past medical, surgical, family and social history reviewed and updated as indicated. Interim medical history since our last visit reviewed. Allergies and medications reviewed and updated.  Review of Systems  Constitutional: Negative.   Respiratory: Negative.   Cardiovascular: Negative.   Musculoskeletal: Positive for gait problem and myalgias. Negative for arthralgias, back pain, joint swelling, neck pain and neck stiffness.  Skin: Negative.   Psychiatric/Behavioral: Negative.     Per HPI unless  specifically indicated above     Objective:    BP (!) 155/90   Pulse 86   Wt 178 lb (80.7 kg)   BMI 30.55 kg/m   Wt Readings from Last 3 Encounters:  09/20/19 178 lb (80.7 kg)  08/21/19 179 lb (81.2 kg)  07/24/19 180 lb (81.6 kg)    Physical Exam Vitals and nursing note reviewed.  Pulmonary:     Effort: Pulmonary effort is normal. No respiratory distress.     Comments: Speaking in full sentences Neurological:     Mental Status: She is alert.  Psychiatric:        Mood and Affect: Mood normal.        Behavior: Behavior normal.        Thought Content: Thought content normal.        Judgment: Judgment normal.     Results for orders placed or performed in visit on 08/22/19  Bayer DCA Hb A1c Waived  Result Value Ref Range   HB A1C (BAYER DCA - WAIVED) 5.9 <7.0 %  Microalbumin, Urine Waived  Result Value Ref Range   Microalb, Ur Waived 10 0 - 19 mg/L   Creatinine, Urine Waived 50 10 - 300 mg/dL   Microalb/Creat Ratio <30 <30 mg/g  CBC with Differential OUT  Result Value Ref Range   WBC 7.9 3.4 - 10.8 x10E3/uL   RBC 5.06 3.77 - 5.28 x10E6/uL   Hemoglobin 14.0 11.1 - 15.9  g/dL   Hematocrit 42.0 34.0 - 46.6 %   MCV 83 79 - 97 fL   MCH 27.7 26.6 - 33.0 pg   MCHC 33.3 31.5 - 35.7 g/dL   RDW 14.1 11.7 - 15.4 %   Platelets 151 150 - 450 x10E3/uL   Neutrophils 70 Not Estab. %   Lymphs 21 Not Estab. %   Monocytes 6 Not Estab. %   Eos 2 Not Estab. %   Basos 1 Not Estab. %   Neutrophils Absolute 5.6 1.4 - 7.0 x10E3/uL   Lymphocytes Absolute 1.6 0.7 - 3.1 x10E3/uL   Monocytes Absolute 0.5 0.1 - 0.9 x10E3/uL   EOS (ABSOLUTE) 0.2 0.0 - 0.4 x10E3/uL   Basophils Absolute 0.1 0.0 - 0.2 x10E3/uL   Immature Granulocytes 0 Not Estab. %   Immature Grans (Abs) 0.0 0.0 - 0.1 x10E3/uL  Comp Met (CMET)  Result Value Ref Range   Glucose 98 65 - 99 mg/dL   BUN 9 8 - 27 mg/dL   Creatinine, Ser 0.83 0.57 - 1.00 mg/dL   GFR calc non Af Amer 71 >59 mL/min/1.73   GFR calc Af Amer 82 >59  mL/min/1.73   BUN/Creatinine Ratio 11 (L) 12 - 28   Sodium 142 134 - 144 mmol/L   Potassium 4.1 3.5 - 5.2 mmol/L   Chloride 102 96 - 106 mmol/L   CO2 26 20 - 29 mmol/L   Calcium 9.1 8.7 - 10.3 mg/dL   Total Protein 6.1 6.0 - 8.5 g/dL   Albumin 4.2 3.7 - 4.7 g/dL   Globulin, Total 1.9 1.5 - 4.5 g/dL   Albumin/Globulin Ratio 2.2 1.2 - 2.2   Bilirubin Total 0.4 0.0 - 1.2 mg/dL   Alkaline Phosphatase 118 (H) 39 - 117 IU/L   AST 14 0 - 40 IU/L   ALT 13 0 - 32 IU/L  Lipid Panel w/o Chol/HDL Ratio OUT  Result Value Ref Range   Cholesterol, Total 154 100 - 199 mg/dL   Triglycerides 195 (H) 0 - 149 mg/dL   HDL 34 (L) >39 mg/dL   VLDL Cholesterol Cal 33 5 - 40 mg/dL   LDL Chol Calc (NIH) 87 0 - 99 mg/dL      Assessment & Plan:   Problem List Items Addressed This Visit      Other   Depression, major, single episode, moderate (HCC)    Doing much better. Continue current regimen. Continue to monitor. Call with any concerns.           Follow up plan: Return for End of march- DM.    Marland Kitchen This visit was completed via telephone due to the restrictions of the COVID-19 pandemic. All issues as above were discussed and addressed but no physical exam was performed. If it was felt that the patient should be evaluated in the office, they were directed there. The patient verbally consented to this visit. Patient was unable to complete an audio/visual visit due to Lack of equipment. Due to the catastrophic nature of the COVID-19 pandemic, this visit was done through audio contact only. . Location of the patient: home . Location of the provider: home . Those involved with this call:  . Provider: Park Liter, DO . CMA: Tiffany Reel, CMA . Front Desk/Registration: Don Perking  . Time spent on call: 15 minutes on the phone discussing health concerns. 23 minutes total spent in review of patient's record and preparation of their chart.

## 2019-09-20 NOTE — Assessment & Plan Note (Signed)
Doing much better. Continue current regimen. Continue to monitor. Call with any concerns.  

## 2019-09-22 ENCOUNTER — Ambulatory Visit: Payer: Self-pay | Admitting: Pharmacist

## 2019-09-22 DIAGNOSIS — N183 Chronic kidney disease, stage 3 unspecified: Secondary | ICD-10-CM

## 2019-09-22 NOTE — Patient Instructions (Signed)
Visit Information  Goals Addressed            This Visit's Progress     Patient Stated   . "I want to keep my sugars under control" (pt-stated)       Current Barriers:  . Diabetes: uncontrolled; most recent A1c 5.9%  o Due to reapply for Trulicity patient assistance  . Current antihyperglycemic regimen: Trulicity 1.5 mg weekly  . Current blood glucose readings: 90-170s random (patient notes she doesn't remember to check in any regular pattern) . Cardiovascular risk reduction: o Current hypertensive regimen: carvedilol 12.5 mg BID, furosemide 40 mg daily, isosorbide mononitrate 60 mg daily  o Current hyperlipidemia regimen: rosuvastatin 20 mg daily  Pharmacist Clinical Goal(s):  Marland Kitchen Over the next 90 days, patient with work with PharmD and primary care provider to address optimized medication management  Interventions: . Printed application. Patient submitted 2019 tax return information last year that we can still use this year. Will leave her portion at the front desk of clinic to be completed and signed. Will collaborate w/ Dr. Wynetta Emery on provider portion. Once all parts received, will submit to [facility] and pass along to [employer], CPhT for follow up  Patient Self Care Activities:  . Patient will check blood glucose daily , document, and provide at future appointments . Patient will take medications as prescribed . Patient will report any questions or concerns to provider   Please see past updates related to this goal by clicking on the "Past Updates" button in the selected goal         The patient verbalized understanding of instructions provided today and declined a print copy of patient instruction materials.   Plan:  - Will collaborate w/ patient, provider, and CPhT as above - Scheduled f/u call 10/25/19  [name], PharmD, [place] [phone]

## 2019-09-22 NOTE — Chronic Care Management (AMB) (Signed)
Chronic Care Management   Follow Up Note   09/22/2019 Name: Sarah White MRN: [id number] DOB: December 14, 1945  Referred by: Valerie Roys, DO Reason for referral : Chronic Care Management (Medication Management)   Sarah White is a 74 y.o. year old female who is a primary care patient of Valerie Roys, DO. The CCM team was consulted for assistance with chronic disease management and care coordination needs.    Received call from patient with medication access needs.   Review of patient status, including review of consultants reports, relevant laboratory and other test results, and collaboration with appropriate care team members and the patient's provider was performed as part of comprehensive patient evaluation and provision of chronic care management services.    SDOH (Social Determinants of Health) screening performed today: Financial Strain . See Care Plan for related entries.   Outpatient Encounter Medications as of 09/22/2019  Medication Sig  . Dulaglutide (TRULICITY) 1.5 0000000 SOPN Inject 1.5 mg into the skin once a week.  Marland Kitchen albuterol (VENTOLIN HFA) 108 (90 Base) MCG/ACT inhaler Inhale 2 puffs into the lungs every 6 (six) hours as needed for wheezing or shortness of breath.  Marland Kitchen aspirin EC 81 MG tablet Take 81 mg by mouth at bedtime.   . budesonide-formoterol (SYMBICORT) 160-4.5 MCG/ACT inhaler Inhale 2 puffs into the lungs 2 (two) times daily.  . carvedilol (COREG) 12.5 MG tablet TAKE 2 TABLETS BY MOUTH TWICE DAILY WITH A MEAL  . cyclobenzaprine (FLEXERIL) 10 MG tablet Take 1 tablet (10 mg total) by mouth 3 (three) times daily as needed for muscle spasms. DO NOT DRIVE ON THIS MEDICINE- WILL MAKE YOU SLEEPY  . furosemide (LASIX) 40 MG tablet Take 1 tablet (40 mg total) by mouth daily.  . isosorbide mononitrate (IMDUR) 60 MG 24 hr tablet Take 1 tablet (60 mg total) by mouth daily.  . Multiple Vitamin (MULTIVITAMIN WITH MINERALS) TABS tablet Take 1 tablet by mouth daily.  .  nitroGLYCERIN (NITROSTAT) 0.4 MG SL tablet Place 1 tablet (0.4 mg total) under the tongue every 5 (five) minutes as needed for chest pain.  Marland Kitchen ondansetron (ZOFRAN-ODT) 4 MG disintegrating tablet Take 4 mg by mouth every 8 (eight) hours as needed for nausea or vomiting.  . rosuvastatin (CRESTOR) 20 MG tablet Take 1 tablet (20 mg total) by mouth daily.  . sertraline (ZOLOFT) 100 MG tablet Take 2 tablets (200 mg total) by mouth daily.  Marland Kitchen tiotropium (SPIRIVA HANDIHALER) 18 MCG inhalation capsule Place 1 capsule (18 mcg total) into inhaler and inhale daily.  . traZODone (DESYREL) 50 MG tablet TAKE 1/2 TO 1 (ONE-HALF TO ONE) TABLET BY MOUTH AT BEDTIME AS NEEDED FOR SLEEP  . triamcinolone ointment (KENALOG) 0.5 % Apply 1 application topically 2 (two) times daily.  Marland Kitchen triamcinolone ointment (KENALOG) 0.5 % Apply 1 application topically 2 (two) times daily.   No facility-administered encounter medications on file as of 09/22/2019.     Objective:   Goals Addressed            This Visit's Progress     Patient Stated   . "I want to keep my sugars under control" (pt-stated)       Current Barriers:  . Diabetes: uncontrolled; most recent A1c 5.9%  o Due to reapply for Trulicity patient assistance  . Current antihyperglycemic regimen: Trulicity 1.5 mg weekly  . Current blood glucose readings: 90-170s random (patient notes she doesn't remember to check in any regular pattern) . Cardiovascular risk reduction: o  Current hypertensive regimen: carvedilol 12.5 mg BID, furosemide 40 mg daily, isosorbide mononitrate 60 mg daily  o Current hyperlipidemia regimen: rosuvastatin 20 mg daily  Pharmacist Clinical Goal(s):  Marland Kitchen Over the next 90 days, patient with work with PharmD and primary care provider to address optimized medication management  Interventions: . Printed application. Patient submitted 2019 tax return information last year that we can still use this year. Will leave her portion at the front desk of  clinic to be completed and signed. Will collaborate w/ Dr. Wynetta Emery on provider portion. Once all parts received, will submit to [facility] and pass along to [employer], CPhT for follow up  Patient Self Care Activities:  . Patient will check blood glucose daily , document, and provide at future appointments . Patient will take medications as prescribed . Patient will report any questions or concerns to provider   Please see past updates related to this goal by clicking on the "Past Updates" button in the selected goal          Plan:  - Will collaborate w/ patient, provider, and CPhT as above - Scheduled f/u call 10/25/19  [name], PharmD, [place] [phone]

## 2019-09-26 ENCOUNTER — Encounter: Payer: Self-pay | Admitting: Family Medicine

## 2019-09-26 ENCOUNTER — Ambulatory Visit (INDEPENDENT_AMBULATORY_CARE_PROVIDER_SITE_OTHER): Payer: Medicare Other | Admitting: Pharmacist

## 2019-09-26 ENCOUNTER — Other Ambulatory Visit: Payer: Self-pay | Admitting: Pharmacy Technician

## 2019-09-26 DIAGNOSIS — N183 Chronic kidney disease, stage 3 unspecified: Secondary | ICD-10-CM | POA: Diagnosis not present

## 2019-09-26 DIAGNOSIS — E1122 Type 2 diabetes mellitus with diabetic chronic kidney disease: Secondary | ICD-10-CM

## 2019-09-26 NOTE — Progress Notes (Signed)
Called pt to schedule f/u, no answer, left vm, sent mychart message, sent letter.

## 2019-09-26 NOTE — Patient Instructions (Signed)
Visit Information  Goals Addressed            This Visit's Progress     Patient Stated   . "I want to keep my sugars under control" (pt-stated)       Current Barriers:  . Diabetes: uncontrolled; most recent A1c 5.9%  o Due to reapply for Trulicity patient assistance  o Received call from patient today - she noted she just received 2021 SSA and retirement income information, and wonders if I need this for the reapplication . Current antihyperglycemic regimen: Trulicity 1.5 mg weekly  . Current blood glucose readings: 90-170s random (patient notes she doesn't remember to check in any regular pattern) . Cardiovascular risk reduction: o Current hypertensive regimen: carvedilol 12.5 mg BID, furosemide 40 mg daily, isosorbide mononitrate 60 mg daily  o Current hyperlipidemia regimen: rosuvastatin 20 mg daily  Pharmacist Clinical Goal(s):  Marland Kitchen Over the next 90 days, patient with work with PharmD and primary care provider to address optimized medication management  Interventions: . Patient will fax me her updated income information to the clinic fax. I will collaborate w/ clinic staff to retrieve, and then pass along to [facility], CPhT for submission and follow up  Patient Self Care Activities:  . Patient will check blood glucose daily , document, and provide at future appointments . Patient will take medications as prescribed . Patient will report any questions or concerns to provider   Please see past updates related to this goal by clicking on the "Past Updates" button in the selected goal         The patient verbalized understanding of instructions provided today and declined a print copy of patient instruction materials.   Plan:  - Will collaborate w/ patient and CPhT as above  [name], PharmD, [place] [phone]

## 2019-09-26 NOTE — Chronic Care Management (AMB) (Signed)
Chronic Care Management   Follow Up Note   09/26/2019 Name: Sarah White MRN: [id number] DOB: 1946-08-16  Referred by: Valerie Roys, DO Reason for referral : Chronic Care Management (Medication Management)   Sarah White is a 74 y.o. year old female who is a primary care patient of Valerie Roys, DO. The CCM team was consulted for assistance with chronic disease management and care coordination needs.    Contacted patient for medication management review.   Review of patient status, including review of consultants reports, relevant laboratory and other test results, and collaboration with appropriate care team members and the patient's provider was performed as part of comprehensive patient evaluation and provision of chronic care management services.    SDOH (Social Determinants of Health) screening performed today: Financial Strain . See Care Plan for related entries.   Outpatient Encounter Medications as of 09/26/2019  Medication Sig  . albuterol (VENTOLIN HFA) 108 (90 Base) MCG/ACT inhaler Inhale 2 puffs into the lungs every 6 (six) hours as needed for wheezing or shortness of breath.  Marland Kitchen aspirin EC 81 MG tablet Take 81 mg by mouth at bedtime.   . budesonide-formoterol (SYMBICORT) 160-4.5 MCG/ACT inhaler Inhale 2 puffs into the lungs 2 (two) times daily.  . carvedilol (COREG) 12.5 MG tablet TAKE 2 TABLETS BY MOUTH TWICE DAILY WITH A MEAL  . cyclobenzaprine (FLEXERIL) 10 MG tablet Take 1 tablet (10 mg total) by mouth 3 (three) times daily as needed for muscle spasms. DO NOT DRIVE ON THIS MEDICINE- WILL MAKE YOU SLEEPY  . Dulaglutide (TRULICITY) 1.5 0000000 SOPN Inject 1.5 mg into the skin once a week.  . furosemide (LASIX) 40 MG tablet Take 1 tablet (40 mg total) by mouth daily.  . isosorbide mononitrate (IMDUR) 60 MG 24 hr tablet Take 1 tablet (60 mg total) by mouth daily.  . Multiple Vitamin (MULTIVITAMIN WITH MINERALS) TABS tablet Take 1 tablet by mouth daily.  .  nitroGLYCERIN (NITROSTAT) 0.4 MG SL tablet Place 1 tablet (0.4 mg total) under the tongue every 5 (five) minutes as needed for chest pain.  Marland Kitchen ondansetron (ZOFRAN-ODT) 4 MG disintegrating tablet Take 4 mg by mouth every 8 (eight) hours as needed for nausea or vomiting.  . rosuvastatin (CRESTOR) 20 MG tablet Take 1 tablet (20 mg total) by mouth daily.  . sertraline (ZOLOFT) 100 MG tablet Take 2 tablets (200 mg total) by mouth daily.  Marland Kitchen tiotropium (SPIRIVA HANDIHALER) 18 MCG inhalation capsule Place 1 capsule (18 mcg total) into inhaler and inhale daily.  . traZODone (DESYREL) 50 MG tablet TAKE 1/2 TO 1 (ONE-HALF TO ONE) TABLET BY MOUTH AT BEDTIME AS NEEDED FOR SLEEP  . triamcinolone ointment (KENALOG) 0.5 % Apply 1 application topically 2 (two) times daily.  Marland Kitchen triamcinolone ointment (KENALOG) 0.5 % Apply 1 application topically 2 (two) times daily.   No facility-administered encounter medications on file as of 09/26/2019.     Objective:   Goals Addressed            This Visit's Progress     Patient Stated   . "I want to keep my sugars under control" (pt-stated)       Current Barriers:  . Diabetes: uncontrolled; most recent A1c 5.9%  o Due to reapply for Trulicity patient assistance  o Received call from patient today - she noted she just received 2021 SSA and retirement income information, and wonders if I need this for the reapplication . Current antihyperglycemic regimen: Trulicity 1.5 mg  weekly  . Current blood glucose readings: 90-170s random (patient notes she doesn't remember to check in any regular pattern) . Cardiovascular risk reduction: o Current hypertensive regimen: carvedilol 12.5 mg BID, furosemide 40 mg daily, isosorbide mononitrate 60 mg daily  o Current hyperlipidemia regimen: rosuvastatin 20 mg daily  Pharmacist Clinical Goal(s):  Marland Kitchen Over the next 90 days, patient with work with PharmD and primary care provider to address optimized medication  management  Interventions: . Patient will fax me her updated income information to the clinic fax. I will collaborate w/ clinic staff to retrieve, and then pass along to [facility], CPhT for submission and follow up  Patient Self Care Activities:  . Patient will check blood glucose daily , document, and provide at future appointments . Patient will take medications as prescribed . Patient will report any questions or concerns to provider   Please see past updates related to this goal by clicking on the "Past Updates" button in the selected goal          Plan:  - Will collaborate w/ patient and CPhT as above  [name], PharmD, [place] [phone]

## 2019-09-26 NOTE — Patient Outreach (Addendum)
[facility]) Care Management  09/26/2019  Sarah SCHAAN 1946-08-04 [id number]  Received both patient and provider portion(s) of patient assistance application(s) for Trulicity. Faxed completed application and required documents into [employer].  Will follow up with company(ies) in 5-7 business days to check status of application(s).  [name] P. Linlee Cromie, [place] Management [phone]

## 2019-10-04 ENCOUNTER — Other Ambulatory Visit: Payer: Self-pay | Admitting: Pharmacy Technician

## 2019-10-04 NOTE — Patient Outreach (Signed)
[facility]) Care Management  10/04/2019  Sarah White 22-Feb-1946 [id number]   Care coordination call placed to [place] in regards to patient's application for Trulicity.  Spoke to [place] who was unable to locate the application that was faxed on 09/26/2019. She informed that could mean that did not receive it or that it has not been attached to her file and is still in the que. She informed that we could either try back in a few days (processing times are delayed somewhat) or re fax it [employer] at [phone].  Will re fax the application when in the office later this week. Will follow up in 3-5 business days.  Sarah White P. Shanda Cadotte, [place] Management [phone]

## 2019-10-05 ENCOUNTER — Other Ambulatory Visit: Payer: Self-pay | Admitting: Pharmacy Technician

## 2019-10-05 NOTE — Patient Outreach (Signed)
[facility]) Care Management  10/05/2019  Sarah White Feb 01, 1946 [id number]    Re faxed application per [employer] representative on 10/04/2019, they were unable to locate the original faxed document for patient's Trulicity.  Will follow up with [employer] in 3-5 business days.  Sarah White P. Sarah White, [place] Management [phone]

## 2019-10-09 ENCOUNTER — Other Ambulatory Visit: Payer: Self-pay | Admitting: Pharmacy Technician

## 2019-10-09 NOTE — Patient Outreach (Signed)
[facility]) Care Management  10/09/2019  Sarah White 06-Dec-1945 [id number]   Care coordination call placed to [place] in regards to Trulicity application.  Spoke to [place] who informed she was unable to locate the application in the que and there was no new application attached to her file. Caryl Pina suggests that the application be re faxed to [place] at the original number we normally faxed to as well as to this new number which is [phone]. She informed this fax number is usually for oncology products but that it would still get to the correct department.  Will re fax the application when back in the office later this week and will follow up with [employer] in 5-7 business days.  Hilliard Borges P. Eugena Rhue, [place] Management [phone]

## 2019-10-12 ENCOUNTER — Telehealth: Payer: Self-pay

## 2019-10-12 NOTE — Telephone Encounter (Signed)
Copied from [place] [id number]. Topic: General - Inquiry >> Oct 12, 2019  8:11 AM Virl Axe D wrote: Reason for CRM: Pt stated she is having her first Covid vaccine on 10/16/19. She would like to know if her Trulicity will interfere with the vaccine. Please advise.   Routing to provider to advise.

## 2019-10-12 NOTE — Telephone Encounter (Signed)
Patient notified and verbalized understanding. 

## 2019-10-12 NOTE — Telephone Encounter (Signed)
It will not! I'm glad she's getting her shot!

## 2019-10-17 ENCOUNTER — Other Ambulatory Visit: Payer: Self-pay

## 2019-10-17 ENCOUNTER — Encounter: Payer: Self-pay | Admitting: Family Medicine

## 2019-10-17 ENCOUNTER — Other Ambulatory Visit: Payer: Self-pay | Admitting: Pharmacy Technician

## 2019-10-17 NOTE — Patient Outreach (Signed)
[facility]) Care Management  10/17/2019  Sarah White 1946-02-11 [id number]    Care coordination call placed to [place] in regards to Trulicity application.  Spoke to [facility] who informed patient was APPROVED 10/10/2019-08/31/2011. He informed an order was placed with the pharmacy on 10/10/2019 but he has no other  shipment information. He suggests having patient call into [employer] at [phone] to schedule a delivery if one is needed.  Will outreach patient with this information.  [name] P. Frederick Marro, [place] Management [phone]

## 2019-10-17 NOTE — Patient Outreach (Signed)
[facility]) Care Management  10/17/2019  Sarah White 14-Feb-1946 [id number]   ADDENDUM  Incoming call from patient regarding patient assistance medication delivery of Trulicity with [employer], HIPAA identifiers verified.   Patient informed she received 4 boxes of Trulicity over the weekend. Informed patient she was on auto shipments but if she was down to a 2 weeks supply and had not heard from the company, then she would need to call [employer] to schedule her delivery. Provided patient phone number to [place]. Patient verbalized understanding. Confirmed patient had name and number.  Follow up:  Will route note to embedded [facility] RPh Catie Hamilton as FYI.  Lonie Rummell P. Andrews Tener, [place] Management [phone]

## 2019-10-17 NOTE — Patient Outreach (Signed)
[facility]) Care Management  10/17/2019  SABENA GISMONDI [birth year] [id number]    ADDENDUM   Unsuccessful call placed to patient regarding patient assistance application(s) for Trulciity with [employer] , HIPAA compliant voicemail left.   Was calling to update patient on her approval and to provide her the phone number to [place] to set up delivery if one is needed.  Will request [facility] RPh [name] to inform and update patient at next scheduled visit.  Follow up:  Will route note to embedded [place] for case closure and will remove myself from care team.  Luiz Ochoa. Kaeleb Emond, [place] Management [phone]

## 2019-10-18 ENCOUNTER — Encounter: Payer: Self-pay | Admitting: Family Medicine

## 2019-10-18 ENCOUNTER — Other Ambulatory Visit: Payer: Self-pay

## 2019-10-18 ENCOUNTER — Ambulatory Visit (INDEPENDENT_AMBULATORY_CARE_PROVIDER_SITE_OTHER): Payer: Medicare Other | Admitting: Family Medicine

## 2019-10-18 VITALS — BP 124/71 | HR 79 | Temp 98.4°F | Ht 64.0 in | Wt 180.0 lb

## 2019-10-18 DIAGNOSIS — B370 Candidal stomatitis: Secondary | ICD-10-CM | POA: Diagnosis not present

## 2019-10-18 MED ORDER — NYSTATIN 100000 UNIT/ML MT SUSP: 5.0000 mL | Freq: Four times a day (QID) | OROMUCOSAL | 0 refills | Status: DC

## 2019-10-18 NOTE — Progress Notes (Signed)
BP 124/71   Pulse 79   Temp 98.4 F (36.9 C) (Oral)   Ht _0  (1.626 m)   Wt 180 lb (81.6 kg)   SpO2 94%   BMI 30.90 kg/m    Subjective:    [name], female    DOB: [birth year], 74 y.o.   MRN: [id number]  HPI: Sarah White is a 74 y.o. female  Chief Complaint  Patient presents with  . Oral Pain    pt states has had burning white spots on her tongue since last Thursday   Patient presenting today for about a week of white spots on tongue and diffuse soreness that is worst when eating acidic foods. Denies fevers, chills, open sores. Trying salt water gargles with minimal relief.   Relevant past medical, surgical, family and social history reviewed and updated as indicated. Interim medical history since our last visit reviewed. Allergies and medications reviewed and updated.  Review of Systems  Per HPI unless specifically indicated above     Objective:    BP 124/71   Pulse 79   Temp 98.4 F (36.9 C) (Oral)   Ht _1  (1.626 m)   Wt 180 lb (81.6 kg)   SpO2 94%   BMI 30.90 kg/m   Wt Readings from Last 3 Encounters:  10/18/19 180 lb (81.6 kg)  09/20/19 178 lb (80.7 kg)  08/21/19 179 lb (81.2 kg)    Physical Exam Vitals and nursing note reviewed.  Constitutional:      Appearance: Normal appearance. She is not ill-appearing.  HENT:     Head: Atraumatic.     Mouth/Throat:     Mouth: Mucous membranes are moist.     Comments: White coating on tongue on erythematous base Eyes:     Extraocular Movements: Extraocular movements intact.     Conjunctiva/sclera: Conjunctivae normal.  Cardiovascular:     Rate and Rhythm: Normal rate and regular rhythm.     Heart sounds: Normal heart sounds.  Pulmonary:     Effort: Pulmonary effort is normal.     Breath sounds: Normal breath sounds.  Musculoskeletal:        General: Normal range of motion.     Cervical back: Normal range of motion and neck supple.  Skin:    General: Skin is warm and dry.    Neurological:     Mental Status: She is alert and oriented to person, place, and time.  Psychiatric:        Mood and Affect: Mood normal.        Thought Content: Thought content normal.        Judgment: Judgment normal.     Results for orders placed or performed in visit on 08/22/19  Bayer DCA Hb A1c Waived  Result Value Ref Range   HB A1C (BAYER DCA - WAIVED) 5.9 <7.0 %  Microalbumin, Urine Waived  Result Value Ref Range   Microalb, Ur Waived 10 0 - 19 mg/L   Creatinine, Urine Waived 50 10 - 300 mg/dL   Microalb/Creat Ratio <30 <30 mg/g  CBC with Differential OUT  Result Value Ref Range   WBC 7.9 3.4 - 10.8 x10E3/uL   RBC 5.06 3.77 - 5.28 x10E6/uL   Hemoglobin 14.0 11.1 - 15.9 g/dL   Hematocrit 42.0 34.0 - 46.6 %   MCV 83 79 - 97 fL   MCH 27.7 26.6 - 33.0 pg   MCHC 33.3 31.5 - 35.7 g/dL   RDW 14.1  11.7 - 15.4 %   Platelets 151 150 - 450 x10E3/uL   Neutrophils 70 Not Estab. %   Lymphs 21 Not Estab. %   Monocytes 6 Not Estab. %   Eos 2 Not Estab. %   Basos 1 Not Estab. %   Neutrophils Absolute 5.6 1.4 - 7.0 x10E3/uL   Lymphocytes Absolute 1.6 0.7 - 3.1 x10E3/uL   Monocytes Absolute 0.5 0.1 - 0.9 x10E3/uL   EOS (ABSOLUTE) 0.2 0.0 - 0.4 x10E3/uL   Basophils Absolute 0.1 0.0 - 0.2 x10E3/uL   Immature Granulocytes 0 Not Estab. %   Immature Grans (Abs) 0.0 0.0 - 0.1 x10E3/uL  Comp Met (CMET)  Result Value Ref Range   Glucose 98 65 - 99 mg/dL   BUN 9 8 - 27 mg/dL   Creatinine, Ser 0.83 0.57 - 1.00 mg/dL   GFR calc non Af Amer 71 >59 mL/min/1.73   GFR calc Af Amer 82 >59 mL/min/1.73   BUN/Creatinine Ratio 11 (L) 12 - 28   Sodium 142 134 - 144 mmol/L   Potassium 4.1 3.5 - 5.2 mmol/L   Chloride 102 96 - 106 mmol/L   CO2 26 20 - 29 mmol/L   Calcium 9.1 8.7 - 10.3 mg/dL   Total Protein 6.1 6.0 - 8.5 g/dL   Albumin 4.2 3.7 - 4.7 g/dL   Globulin, Total 1.9 1.5 - 4.5 g/dL   Albumin/Globulin Ratio 2.2 1.2 - 2.2   Bilirubin Total 0.4 0.0 - 1.2 mg/dL   Alkaline Phosphatase  118 (H) 39 - 117 IU/L   AST 14 0 - 40 IU/L   ALT 13 0 - 32 IU/L  Lipid Panel w/o Chol/HDL Ratio OUT  Result Value Ref Range   Cholesterol, Total 154 100 - 199 mg/dL   Triglycerides 195 (H) 0 - 149 mg/dL   HDL 34 (L) >39 mg/dL   VLDL Cholesterol Cal 33 5 - 40 mg/dL   LDL Chol Calc (NIH) 87 0 - 99 mg/dL      Assessment & Plan:   Problem List Items Addressed This Visit    None    Visit Diagnoses    Thrush, oral    -  Primary   Suspect from symbicort, pt not been rinsing mouth after use. Tx with nystatin mouthwash, salt water gargles. Will send diflucan tabs if not resolving   Relevant Medications   nystatin (MYCOSTATIN) 100000 UNIT/ML suspension       Follow up plan: Return if symptoms worsen or fail to improve.

## 2019-10-25 ENCOUNTER — Ambulatory Visit (INDEPENDENT_AMBULATORY_CARE_PROVIDER_SITE_OTHER): Payer: Medicare Other | Admitting: Pharmacist

## 2019-10-25 DIAGNOSIS — J449 Chronic obstructive pulmonary disease, unspecified: Secondary | ICD-10-CM | POA: Diagnosis not present

## 2019-10-25 NOTE — Patient Instructions (Signed)
Visit Information  Goals Addressed            This Visit's Progress     Patient Stated   . "I can't afford my medications" (pt-stated)       CARE PLAN ENTRY (see longtitudinal plan of care for additional care plan information)  Current Barriers:  . Financial Barriers- brand name medications expensive  o 123456: Trulicity 1.5 mg once weekly, A1c at goal <7%. APPROVED to receive Trulicity through [place] assistance through 08/30/20 o COPD: Symbicort 160/4.5 mcg BID, Spiriva 18 mcg daily. Reports that these are difficult to afford. Denied for Spiriva assistance in 2020 d/t income, and was never able to pick up proof of copay from the pharmacy d/t pandemic. Does report that thrush is improving. Indicates that she has been rinsing her mouth after Symbicort use since then o CAD/CHF: carvedilol 25 mg BID, furosemide 40 mg daily, isosorbide 60 mg daily  o ASCVD risk reduction: aspirin 81 mg daily, rosuvastatin 20 mg daily; LDL at goal <100, though could consider a more stringent goal of <70 given dx CAD o Mood/sleep: sertraline 200 mg daily, trazodone 25-50 mg Qpm  Pharmacist Clinical Goal(s):  Marland Kitchen Over the next 90 days, patient will work with PharmD and primary care provider to address needs related to medication access and ensure optimized medication management  Interventions: . Comprehensive medication management review. Medication list updated in electronic medical record . Reviewed refill protocol for Trulicity assistance . Reviewed income cut offs for Symbicort and Spiriva assistance for 2021. Patient does not remember what her specific income was. Will collaborate w/ Susy Frizzle, CPhT to review and see if patient would qualify for assistance for either of these agents.   Patient Self Care Activities:  . Self administers medications as prescribed  Please see past updates related to this goal by clicking on the "Past Updates" button in the selected goal         Patient verbalizes  understanding of instructions provided today.   Plan:  - Will collaborate w/ CPhT as above  [name], PharmD, [place] [phone]

## 2019-10-25 NOTE — Chronic Care Management (AMB) (Signed)
Chronic Care Management   Follow Up Note   10/25/2019 Name: SHAQUAYLA ZOLL MRN: [id number] DOB: 1946-03-26  Referred by: Valerie Roys, DO Reason for referral : Chronic Care Management (Medication Management)   Sarah White is a 74 y.o. year old female who is a primary care patient of Valerie Roys, DO. The CCM team was consulted for assistance with chronic disease management and care coordination needs.    Contacted patient for medication management review.   Review of patient status, including review of consultants reports, relevant laboratory and other test results, and collaboration with appropriate care team members and the patient's provider was performed as part of comprehensive patient evaluation and provision of chronic care management services.    SDOH (Social Determinants of Health) assessments performed: Yes See Care Plan activities for detailed interventions related to SDOH)  SDOH Interventions     Most Recent Value  SDOH Interventions  SDOH Interventions for the Following Domains  Financial Strain  Financial Strain Interventions  Other (Comment) [Discussed patient assistance]       Outpatient Encounter Medications as of 10/25/2019  Medication Sig  . aspirin EC 81 MG tablet Take 81 mg by mouth at bedtime.   . budesonide-formoterol (SYMBICORT) 160-4.5 MCG/ACT inhaler Inhale 2 puffs into the lungs 2 (two) times daily.  . carvedilol (COREG) 12.5 MG tablet TAKE 2 TABLETS BY MOUTH TWICE DAILY WITH A MEAL  . Dulaglutide (TRULICITY) 1.5 0000000 SOPN Inject 1.5 mg into the skin once a week.  . furosemide (LASIX) 40 MG tablet Take 1 tablet (40 mg total) by mouth daily.  . isosorbide mononitrate (IMDUR) 60 MG 24 hr tablet Take 1 tablet (60 mg total) by mouth daily.  . Multiple Vitamin (MULTIVITAMIN WITH MINERALS) TABS tablet Take 1 tablet by mouth daily.  Marland Kitchen nystatin (MYCOSTATIN) 100000 UNIT/ML suspension Take 5 mLs (500,000 Units total) by mouth 4 (four) times daily.    . rosuvastatin (CRESTOR) 20 MG tablet Take 1 tablet (20 mg total) by mouth daily.  . sertraline (ZOLOFT) 100 MG tablet Take 2 tablets (200 mg total) by mouth daily.  Marland Kitchen tiotropium (SPIRIVA HANDIHALER) 18 MCG inhalation capsule Place 1 capsule (18 mcg total) into inhaler and inhale daily.  . traZODone (DESYREL) 50 MG tablet TAKE 1/2 TO 1 (ONE-HALF TO ONE) TABLET BY MOUTH AT BEDTIME AS NEEDED FOR SLEEP  . albuterol (VENTOLIN HFA) 108 (90 Base) MCG/ACT inhaler Inhale 2 puffs into the lungs every 6 (six) hours as needed for wheezing or shortness of breath.  . cyclobenzaprine (FLEXERIL) 10 MG tablet Take 1 tablet (10 mg total) by mouth 3 (three) times daily as needed for muscle spasms. DO NOT DRIVE ON THIS MEDICINE- WILL MAKE YOU SLEEPY  . nitroGLYCERIN (NITROSTAT) 0.4 MG SL tablet Place 1 tablet (0.4 mg total) under the tongue every 5 (five) minutes as needed for chest pain. (Patient not taking: Reported on 10/25/2019)  . ondansetron (ZOFRAN-ODT) 4 MG disintegrating tablet Take 4 mg by mouth every 8 (eight) hours as needed for nausea or vomiting.  . triamcinolone ointment (KENALOG) 0.5 % Apply 1 application topically 2 (two) times daily.   No facility-administered encounter medications on file as of 10/25/2019.     Objective:   Goals Addressed            This Visit's Progress     Patient Stated   . "I can't afford my medications" (pt-stated)       CARE PLAN ENTRY (see longtitudinal plan of care  for additional care plan information)  Current Barriers:  . Financial Barriers- brand name medications expensive  o 123456: Trulicity 1.5 mg once weekly, A1c at goal <7%. APPROVED to receive Trulicity through [place] assistance through 08/30/20 o COPD: Symbicort 160/4.5 mcg BID, Spiriva 18 mcg daily. Reports that these are difficult to afford. Denied for Spiriva assistance in 2020 d/t income, and was never able to pick up proof of copay from the pharmacy d/t pandemic. Does report that thrush is improving.  Indicates that she has been rinsing her mouth after Symbicort use since then o CAD/CHF: carvedilol 25 mg BID, furosemide 40 mg daily, isosorbide 60 mg daily  o ASCVD risk reduction: aspirin 81 mg daily, rosuvastatin 20 mg daily; LDL at goal <100, though could consider a more stringent goal of <70 given dx CAD o Mood/sleep: sertraline 200 mg daily, trazodone 25-50 mg Qpm  Pharmacist Clinical Goal(s):  Marland Kitchen Over the next 90 days, patient will work with PharmD and primary care provider to address needs related to medication access and ensure optimized medication management  Interventions: . Comprehensive medication management review. Medication list updated in electronic medical record . Reviewed refill protocol for Trulicity assistance . Reviewed income cut offs for Symbicort and Spiriva assistance for 2021. Patient does not remember what her specific income was. Will collaborate w/ Susy Frizzle, CPhT to review and see if patient would qualify for assistance for either of these agents.   Patient Self Care Activities:  . Self administers medications as prescribed  Please see past updates related to this goal by clicking on the "Past Updates" button in the selected goal          Plan:  - Will collaborate w/ CPhT as above  [name], PharmD, [place] [phone]

## 2019-10-25 NOTE — Chronic Care Management (AMB) (Signed)
  Chronic Care Management   Note  10/25/2019 Name: Sarah White MRN: [id number] DOB: December 06, 1945  Sarah White is a 74 y.o. year old female who is a primary care patient of Valerie Roys, DO. The CCM team was consulted for assistance with chronic disease management and care coordination needs.    Reviewed income information w/ CPhT. Patient should qualify for Symbicort assistance, will not qualify for Spiriva assistance until she hits Coverage Gap (based on her income, her copay for Spiriva would need to be >$95/month, she notes that it isn't that until she is in Coverage Gap)  Will collaborate w/ CPhT to mail patient her portion of [place] application for Symbicort. Patient aware that we need updated 2021 income documentation   [name], PharmD, [place] [phone]

## 2019-10-27 ENCOUNTER — Other Ambulatory Visit: Payer: Self-pay | Admitting: Pharmacy Technician

## 2019-10-27 NOTE — Patient Outreach (Signed)
[facility]) Care Management  10/27/2019  KAJUANA MARSAN 1945/09/28 [id number]                                        Medication Assistance Referral  Referral From: [place] RPh Catie T.   Medication/Company: Symbicort / [place] Patient application portion:  Mailed Provider application portion:  N/A embedded [facility] R[facility] to have signed while in clinic to Dr. Park Liter Provider address/fax verified via: Office website     Follow up:  Will follow up with patient in 5-15 business days to confirm application(s) have been received.  Alyzae Hawkey P. Giordano Getman, [place] Management [phone]

## 2019-11-02 ENCOUNTER — Ambulatory Visit: Payer: Self-pay | Admitting: Pharmacist

## 2019-11-02 NOTE — Chronic Care Management (AMB) (Signed)
  Chronic Care Management   Note  11/02/2019 Name: Sarah White MRN: [id number] DOB: 1946-01-11  Sarah White is a 74 y.o. year old female who is a primary care patient of Valerie Roys, DO. The CCM team was consulted for assistance with chronic disease management and care coordination needs.    Patient called with questions about what [employer] insurance is. Informed that she has a Medicare part F plan. She is mailing back patient assistance applications tomorrow.   Follow up plan: - Will outreach as previously scheduled  [name], PharmD, [place] [phone]

## 2019-11-07 ENCOUNTER — Ambulatory Visit: Payer: Self-pay | Admitting: Pharmacist

## 2019-11-07 DIAGNOSIS — J449 Chronic obstructive pulmonary disease, unspecified: Secondary | ICD-10-CM

## 2019-11-07 NOTE — Chronic Care Management (AMB) (Signed)
Chronic Care Management   Follow Up Note   11/07/2019 Name: Sarah White MRN: [id number] DOB: 02/01/1946  Referred by: Valerie Roys, DO Reason for referral : Chronic Care Management (Medication Management)   Sarah White is a 74 y.o. year old female who is a primary care patient of Valerie Roys, DO. The CCM team was consulted for assistance with chronic disease management and care coordination needs.    Care coordination completed today.  Review of patient status, including review of consultants reports, relevant laboratory and other test results, and collaboration with appropriate care team members and the patient's provider was performed as part of comprehensive patient evaluation and provision of chronic care management services.    SDOH (Social Determinants of Health) assessments performed: Yes See Care Plan activities for detailed interventions related to [facility])     Outpatient Encounter Medications as of 11/07/2019  Medication Sig  . albuterol (VENTOLIN HFA) 108 (90 Base) MCG/ACT inhaler Inhale 2 puffs into the lungs every 6 (six) hours as needed for wheezing or shortness of breath.  Marland Kitchen aspirin EC 81 MG tablet Take 81 mg by mouth at bedtime.   . budesonide-formoterol (SYMBICORT) 160-4.5 MCG/ACT inhaler Inhale 2 puffs into the lungs 2 (two) times daily.  . carvedilol (COREG) 12.5 MG tablet TAKE 2 TABLETS BY MOUTH TWICE DAILY WITH A MEAL  . cyclobenzaprine (FLEXERIL) 10 MG tablet Take 1 tablet (10 mg total) by mouth 3 (three) times daily as needed for muscle spasms. DO NOT DRIVE ON THIS MEDICINE- WILL MAKE YOU SLEEPY  . Dulaglutide (TRULICITY) 1.5 0000000 SOPN Inject 1.5 mg into the skin once a week.  . furosemide (LASIX) 40 MG tablet Take 1 tablet (40 mg total) by mouth daily.  . isosorbide mononitrate (IMDUR) 60 MG 24 hr tablet Take 1 tablet (60 mg total) by mouth daily.  . Multiple Vitamin (MULTIVITAMIN WITH MINERALS) TABS tablet Take 1 tablet by mouth daily.  .  nitroGLYCERIN (NITROSTAT) 0.4 MG SL tablet Place 1 tablet (0.4 mg total) under the tongue every 5 (five) minutes as needed for chest pain. (Patient not taking: Reported on 10/25/2019)  . nystatin (MYCOSTATIN) 100000 UNIT/ML suspension Take 5 mLs (500,000 Units total) by mouth 4 (four) times daily.  . ondansetron (ZOFRAN-ODT) 4 MG disintegrating tablet Take 4 mg by mouth every 8 (eight) hours as needed for nausea or vomiting.  . rosuvastatin (CRESTOR) 20 MG tablet Take 1 tablet (20 mg total) by mouth daily.  . sertraline (ZOLOFT) 100 MG tablet Take 2 tablets (200 mg total) by mouth daily.  Marland Kitchen tiotropium (SPIRIVA HANDIHALER) 18 MCG inhalation capsule Place 1 capsule (18 mcg total) into inhaler and inhale daily.  . traZODone (DESYREL) 50 MG tablet TAKE 1/2 TO 1 (ONE-HALF TO ONE) TABLET BY MOUTH AT BEDTIME AS NEEDED FOR SLEEP  . triamcinolone ointment (KENALOG) 0.5 % Apply 1 application topically 2 (two) times daily.   No facility-administered encounter medications on file as of 11/07/2019.     Objective:   Goals Addressed            This Visit's Progress     Patient Stated   . "I can't afford my medications" (pt-stated)       CARE PLAN ENTRY (see longtitudinal plan of care for additional care plan information)  Current Barriers:  . Financial Barriers- brand name medications expensive  o 123456: Trulicity 1.5 mg once weekly, A1c at goal <7%. APPROVED to receive Trulicity through [place] assistance through 08/30/20 o COPD:  Symbicort 160/4.5 mcg BID, Spiriva 18 mcg daily. Reports that these are difficult to afford. Denied for Spiriva assistance in 2020 d/t income, and was never able to pick up proof of copay from the pharmacy d/t pandemic.  - Applying for Symbicort assistance o CAD/CHF: carvedilol 25 mg BID, furosemide 40 mg daily, isosorbide 60 mg daily  o ASCVD risk reduction: aspirin 81 mg daily, rosuvastatin 20 mg daily; LDL at goal <100, though could consider a more stringent goal of <70 given  dx CAD o Mood/sleep: sertraline 200 mg daily, trazodone 25-50 mg Qpm  Pharmacist Clinical Goal(s):  Marland Kitchen Over the next 90 days, patient will work with PharmD and primary care provider to address needs related to medication access and ensure optimized medication management  Interventions: . Received message from CPhT that patient accidentally forgot to send back page 5 of the Symbicort application. Called patient; she realized she accidentally left that page in the copier. She will bring that by the office this week for me to send to CPhT  Patient Self Care Activities:  . Self administers medications as prescribed  Please see past updates related to this goal by clicking on the "Past Updates" button in the selected goal          Plan:  - Will collaborate w/ patient and CPhT as above  [name], PharmD, [place] [phone]

## 2019-11-07 NOTE — Patient Instructions (Signed)
Visit Information  Goals Addressed            This Visit's Progress     Patient Stated   . "I can't afford my medications" (pt-stated)       CARE PLAN ENTRY (see longtitudinal plan of care for additional care plan information)  Current Barriers:  . Financial Barriers- brand name medications expensive  o 123456: Trulicity 1.5 mg once weekly, A1c at goal <7%. APPROVED to receive Trulicity through [place] assistance through 08/30/20 o COPD: Symbicort 160/4.5 mcg BID, Spiriva 18 mcg daily. Reports that these are difficult to afford. Denied for Spiriva assistance in 2020 d/t income, and was never able to pick up proof of copay from the pharmacy d/t pandemic.  - Applying for Symbicort assistance o CAD/CHF: carvedilol 25 mg BID, furosemide 40 mg daily, isosorbide 60 mg daily  o ASCVD risk reduction: aspirin 81 mg daily, rosuvastatin 20 mg daily; LDL at goal <100, though could consider a more stringent goal of <70 given dx CAD o Mood/sleep: sertraline 200 mg daily, trazodone 25-50 mg Qpm  Pharmacist Clinical Goal(s):  Marland Kitchen Over the next 90 days, patient will work with PharmD and primary care provider to address needs related to medication access and ensure optimized medication management  Interventions: . Received message from CPhT that patient accidentally forgot to send back page 5 of the Symbicort application. Called patient; she realized she accidentally left that page in the copier. She will bring that by the office this week for me to send to CPhT  Patient Self Care Activities:  . Self administers medications as prescribed  Please see past updates related to this goal by clicking on the "Past Updates" button in the selected goal         Patient verbalizes understanding of instructions provided today.   Plan:  - Will collaborate w/ patient and CPhT as above  [name], PharmD, [place] [phone]

## 2019-11-09 ENCOUNTER — Other Ambulatory Visit: Payer: Self-pay | Admitting: Pharmacy Technician

## 2019-11-09 NOTE — Patient Outreach (Signed)
[facility]) Care Management  11/09/2019  Sarah White 12-Nov-1945 [id number]    Received both patient and provider portion(s) of patient assistance application(s) for Symbicort. Faxed completed application and required documents into [employer].  Will follow up with company(ies) in 10-14 business days to check status of application(s).  Chudney Scheffler P. Charnee Turnipseed, [place]  [phone]

## 2019-11-15 ENCOUNTER — Other Ambulatory Visit: Payer: Self-pay | Admitting: Family Medicine

## 2019-11-15 NOTE — Telephone Encounter (Signed)
Requested Prescriptions  Pending Prescriptions Disp Refills  . traZODone (DESYREL) 50 MG tablet [Pharmacy Med Name: traZODone HCl 50 MG Oral Tablet] 90 tablet 1    Sig: TAKE 1/2 TO 1 (ONE-HALF TO ONE) TABLET BY MOUTH AT BEDTIME AS NEEDED FOR SLEEP     Psychiatry: Antidepressants - Serotonin Modulator Passed - 11/15/2019  5:30 AM      Passed - Completed PHQ-2 or PHQ-9 in the last 360 days.      Passed - Valid encounter within last 6 months    Recent Outpatient Visits          4 weeks ago [place], oral   [place], [place], [place]   1 month ago Depression, major, single episode, moderate [facility])   [place], [name], DO   2 months ago Coronary artery disease involving native coronary artery of native heart without angina pectoris   [facility], [name], DO   5 months ago Chronic obstructive pulmonary disease, unspecified COPD type [facility])   [place], [name], DO   6 months ago Acute low back pain, unspecified back pain laterality, unspecified whether sciatica present   [facility] Valerie Roys, DO      Future Appointments            In 1 week Wynetta Emery, Barb Merino, DO [facility], [facility]

## 2019-11-18 ENCOUNTER — Other Ambulatory Visit: Payer: Self-pay | Admitting: Family Medicine

## 2019-11-19 ENCOUNTER — Encounter: Payer: Self-pay | Admitting: Family Medicine

## 2019-11-21 ENCOUNTER — Ambulatory Visit: Payer: Medicare Other | Admitting: Family

## 2019-11-22 ENCOUNTER — Other Ambulatory Visit: Payer: Self-pay | Admitting: Pharmacy Technician

## 2019-11-22 NOTE — Patient Outreach (Signed)
[place] [facility]) Care Management  11/22/2019  Sarah White 12/04/1945 [id number]  Care coordination call placed to [employer] in regards to patient's Symbicort application.  Spoke to [name] who informed patient was APPROVED 11/14/2019-08/30/2020. She informed 3 inhalers were delivered to the patient's home address on 11/17/2019.  Will outreach patient to confirm medication was received and to discuss how to obtain her refills.  Albin Duckett P. Nyella Eckels, [place]  [phone]

## 2019-11-22 NOTE — Patient Outreach (Signed)
[place] [facility]) Care Management  11/22/2019  KAIA KUNKA 1946/02/04 [id number]   ADDENDUM   Unsuccessful call placed to patient regarding patient assistance medication delivery of Symbicort from [employer], HIPAA compliant voicemail left.   Was calling patient to inquire if she has received the medication and to discuss the refill procedure.  Follow up:  Will attempt a 2nd outreach attempt in 5-7 business days.  Annalysa Mohammad P. Alonza Knisley, [place]  [phone]

## 2019-11-23 ENCOUNTER — Other Ambulatory Visit: Payer: Self-pay | Admitting: Pharmacy Technician

## 2019-11-23 NOTE — Patient Outreach (Signed)
[facility]) Care Management  11/23/2019  Sarah White 08-Jan-1946 [id number]   Incoming call from patient regarding patient assistance medication delivery of Symbicort from [employer], HIPAA identifiers verified.   Patient informed she did receive the medication. She informed she received 3 inhalers. Discussed refill procedure with patient which requires patient to call the company when she opens the last inhaler box to avoid a delay in therapy. Patient verbalized understanding. Patient confirmed having name and number as she had no other questions or concerns.  Follow up:  Will route note to embedded [facility] RPh [name] for case closure and will remove myself from care team as patient assistance is completed.  [name], [place]  [phone]

## 2019-11-27 ENCOUNTER — Ambulatory Visit: Payer: Medicare Other | Admitting: Family Medicine

## 2019-12-03 ENCOUNTER — Emergency Department: Payer: Medicare Other

## 2019-12-03 ENCOUNTER — Inpatient Hospital Stay
Admission: EM | Admit: 2019-12-03 | Discharge: 2019-12-07 | DRG: 208 | Disposition: A | Discharge status: Home / Self Care | Payer: Medicare Other | Attending: Internal Medicine | Admitting: Internal Medicine

## 2019-12-03 ENCOUNTER — Other Ambulatory Visit: Payer: Self-pay

## 2019-12-03 DIAGNOSIS — Z8701 Personal history of pneumonia (recurrent): Secondary | ICD-10-CM | POA: Diagnosis not present

## 2019-12-03 DIAGNOSIS — Z888 Allergy status to other drugs, medicaments and biological substances status: Secondary | ICD-10-CM

## 2019-12-03 DIAGNOSIS — R4020 Unspecified coma: Secondary | ICD-10-CM | POA: Diagnosis not present

## 2019-12-03 DIAGNOSIS — J9601 Acute respiratory failure with hypoxia: Secondary | ICD-10-CM | POA: Diagnosis not present

## 2019-12-03 DIAGNOSIS — Z7951 Long term (current) use of inhaled steroids: Secondary | ICD-10-CM | POA: Diagnosis not present

## 2019-12-03 DIAGNOSIS — I5021 Acute systolic (congestive) heart failure: Secondary | ICD-10-CM | POA: Diagnosis not present

## 2019-12-03 DIAGNOSIS — J9621 Acute and chronic respiratory failure with hypoxia: Principal | ICD-10-CM | POA: Diagnosis present

## 2019-12-03 DIAGNOSIS — Z79899 Other long term (current) drug therapy: Secondary | ICD-10-CM | POA: Diagnosis not present

## 2019-12-03 DIAGNOSIS — I5023 Acute on chronic systolic (congestive) heart failure: Secondary | ICD-10-CM | POA: Diagnosis present

## 2019-12-03 DIAGNOSIS — Z4682 Encounter for fitting and adjustment of non-vascular catheter: Secondary | ICD-10-CM | POA: Diagnosis not present

## 2019-12-03 DIAGNOSIS — R404 Transient alteration of awareness: Secondary | ICD-10-CM | POA: Diagnosis not present

## 2019-12-03 DIAGNOSIS — F17210 Nicotine dependence, cigarettes, uncomplicated: Secondary | ICD-10-CM | POA: Diagnosis present

## 2019-12-03 DIAGNOSIS — J9622 Acute and chronic respiratory failure with hypercapnia: Secondary | ICD-10-CM | POA: Diagnosis present

## 2019-12-03 DIAGNOSIS — E1122 Type 2 diabetes mellitus with diabetic chronic kidney disease: Secondary | ICD-10-CM

## 2019-12-03 DIAGNOSIS — I472 Ventricular tachycardia: Secondary | ICD-10-CM | POA: Diagnosis present

## 2019-12-03 DIAGNOSIS — G9341 Metabolic encephalopathy: Secondary | ICD-10-CM | POA: Diagnosis present

## 2019-12-03 DIAGNOSIS — R509 Fever, unspecified: Secondary | ICD-10-CM

## 2019-12-03 DIAGNOSIS — G9340 Encephalopathy, unspecified: Secondary | ICD-10-CM

## 2019-12-03 DIAGNOSIS — J9600 Acute respiratory failure, unspecified whether with hypoxia or hypercapnia: Secondary | ICD-10-CM | POA: Diagnosis not present

## 2019-12-03 DIAGNOSIS — E872 Acidosis, unspecified: Secondary | ICD-10-CM

## 2019-12-03 DIAGNOSIS — J80 Acute respiratory distress syndrome: Secondary | ICD-10-CM | POA: Diagnosis not present

## 2019-12-03 DIAGNOSIS — Z885 Allergy status to narcotic agent status: Secondary | ICD-10-CM | POA: Diagnosis not present

## 2019-12-03 DIAGNOSIS — D72820 Lymphocytosis (symptomatic): Secondary | ICD-10-CM | POA: Diagnosis present

## 2019-12-03 DIAGNOSIS — D72829 Elevated white blood cell count, unspecified: Secondary | ICD-10-CM | POA: Diagnosis not present

## 2019-12-03 DIAGNOSIS — I2510 Atherosclerotic heart disease of native coronary artery without angina pectoris: Secondary | ICD-10-CM | POA: Diagnosis present

## 2019-12-03 DIAGNOSIS — Z955 Presence of coronary angioplasty implant and graft: Secondary | ICD-10-CM

## 2019-12-03 DIAGNOSIS — R55 Syncope and collapse: Secondary | ICD-10-CM | POA: Diagnosis not present

## 2019-12-03 DIAGNOSIS — J441 Chronic obstructive pulmonary disease with (acute) exacerbation: Secondary | ICD-10-CM | POA: Diagnosis present

## 2019-12-03 DIAGNOSIS — Z8249 Family history of ischemic heart disease and other diseases of the circulatory system: Secondary | ICD-10-CM | POA: Diagnosis not present

## 2019-12-03 DIAGNOSIS — Z7982 Long term (current) use of aspirin: Secondary | ICD-10-CM

## 2019-12-03 DIAGNOSIS — E119 Type 2 diabetes mellitus without complications: Secondary | ICD-10-CM | POA: Diagnosis present

## 2019-12-03 DIAGNOSIS — Z20822 Contact with and (suspected) exposure to covid-19: Secondary | ICD-10-CM | POA: Diagnosis present

## 2019-12-03 DIAGNOSIS — I110 Hypertensive heart disease with heart failure: Secondary | ICD-10-CM | POA: Diagnosis present

## 2019-12-03 DIAGNOSIS — J9602 Acute respiratory failure with hypercapnia: Secondary | ICD-10-CM

## 2019-12-03 DIAGNOSIS — I509 Heart failure, unspecified: Secondary | ICD-10-CM | POA: Diagnosis not present

## 2019-12-03 DIAGNOSIS — R069 Unspecified abnormalities of breathing: Secondary | ICD-10-CM | POA: Diagnosis not present

## 2019-12-03 LAB — COMPREHENSIVE METABOLIC PANEL
ALT: 49 U/L — ABNORMAL HIGH (ref 0–44)
AST: 67 U/L — ABNORMAL HIGH (ref 15–41)
Albumin: 4.6 g/dL (ref 3.5–5.0)
Alkaline Phosphatase: 124 U/L (ref 38–126)
Anion gap: 15 (ref 5–15)
BUN: 11 mg/dL (ref 8–23)
CO2: 23 mmol/L (ref 22–32)
Calcium: 9.1 mg/dL (ref 8.9–10.3)
Chloride: 101 mmol/L (ref 98–111)
Creatinine, Ser: 0.93 mg/dL (ref 0.44–1.00)
GFR calc Af Amer: >60 mL/min (ref >60)
GFR calc non Af Amer: >60 mL/min (ref >60)
Glucose, Bld: 203 mg/dL — ABNORMAL HIGH (ref 70–99)
Potassium: 4.1 mmol/L (ref 3.5–5.1)
Sodium: 139 mmol/L (ref 135–145)
Total Bilirubin: 0.9 mg/dL (ref 0.3–1.2)
Total Protein: 7.8 g/dL (ref 6.5–8.1)

## 2019-12-03 LAB — URINALYSIS, ROUTINE W REFLEX MICROSCOPIC
Bacteria, UA: NONE SEEN
Bilirubin Urine: NEGATIVE
Glucose, UA: NEGATIVE mg/dL
Hgb urine dipstick: NEGATIVE
Ketones, ur: NEGATIVE mg/dL
Leukocytes,Ua: NEGATIVE
Nitrite: NEGATIVE
Protein, ur: 100 mg/dL — AB
Specific Gravity, Urine: 1.005 (ref 1.005–1.030)
pH: 7 (ref 5.0–8.0)

## 2019-12-03 LAB — CBC WITH DIFFERENTIAL/PLATELET
Abs Immature Granulocytes: 0.07 10*3/uL (ref 0.00–0.07)
Basophils Absolute: 0.1 10*3/uL (ref 0.0–0.1)
Basophils Relative: 0 %
Eosinophils Absolute: 0.2 10*3/uL (ref 0.0–0.5)
Eosinophils Relative: 2 %
HCT: 47.4 % — ABNORMAL HIGH (ref 36.0–46.0)
Hemoglobin: 14.8 g/dL (ref 12.0–15.0)
Immature Granulocytes: 1 %
Lymphocytes Relative: 41 %
Lymphs Abs: 4.9 10*3/uL — ABNORMAL HIGH (ref 0.7–4.0)
MCH: 27.3 pg (ref 26.0–34.0)
MCHC: 31.2 g/dL (ref 30.0–36.0)
MCV: 87.5 fL (ref 80.0–100.0)
Monocytes Absolute: 0.7 10*3/uL (ref 0.1–1.0)
Monocytes Relative: 6 %
Neutro Abs: 6.1 10*3/uL (ref 1.7–7.7)
Neutrophils Relative %: 50 %
Platelets: 219 10*3/uL (ref 150–400)
RBC: 5.42 MIL/uL — ABNORMAL HIGH (ref 3.87–5.11)
RDW: 14.9 % (ref 11.5–15.5)
WBC: 11.9 10*3/uL — ABNORMAL HIGH (ref 4.0–10.5)
nRBC: 0 % (ref 0.0–0.2)

## 2019-12-03 LAB — BLOOD GAS, VENOUS
Acid-base deficit: 1.3 mmol/L (ref 0.0–2.0)
Bicarbonate: 31.4 mmol/L — ABNORMAL HIGH (ref 20.0–28.0)
MECHVT: 450 mL
Mechanical Rate: 20
O2 Saturation: 87.2 %
PEEP: 5 cmH2O
Patient temperature: 37
pCO2, Ven: 101 mmHg (ref 44.0–60.0)
pH, Ven: 7.1 — CL (ref 7.250–7.430)
pO2, Ven: 72 mmHg — ABNORMAL HIGH (ref 32.0–45.0)

## 2019-12-03 LAB — TROPONIN I (HIGH SENSITIVITY): Troponin I (High Sensitivity): 11 ng/L (ref <18)

## 2019-12-03 LAB — GLUCOSE, CAPILLARY: Glucose-Capillary: 167 mg/dL — ABNORMAL HIGH (ref 70–99)

## 2019-12-03 LAB — BRAIN NATRIURETIC PEPTIDE: B Natriuretic Peptide: 578 pg/mL — ABNORMAL HIGH (ref 0.0–100.0)

## 2019-12-03 LAB — RESPIRATORY PANEL BY RT PCR (FLU A&B, COVID)
Influenza A by PCR: NEGATIVE
Influenza B by PCR: NEGATIVE
SARS Coronavirus 2 by RT PCR: NEGATIVE

## 2019-12-03 LAB — LACTIC ACID, PLASMA
Lactic Acid, Venous: 3 mmol/L (ref 0.5–1.9)
Lactic Acid, Venous: 1 mmol/L (ref 0.5–1.9)

## 2019-12-03 LAB — TRIGLYCERIDES: Triglycerides: 288 mg/dL — ABNORMAL HIGH (ref <150)

## 2019-12-03 LAB — MAGNESIUM: Magnesium: 2.3 mg/dL (ref 1.7–2.4)

## 2019-12-03 MED ORDER — IPRATROPIUM-ALBUTEROL 0.5-2.5 (3) MG/3ML IN SOLN
3.0000 mL | Freq: Once | RESPIRATORY_TRACT | Status: AC
  Administered 2019-12-03: 21:00:00 3 mL via RESPIRATORY_TRACT
  Filled 2019-12-03: qty 3

## 2019-12-03 MED ORDER — VANCOMYCIN HCL IN DEXTROSE 1-5 GM/200ML-% IV SOLN
1000.0000 mg | Freq: Once | INTRAVENOUS | Status: AC
  Administered 2019-12-03: 1000 mg via INTRAVENOUS
  Filled 2019-12-03: qty 200

## 2019-12-03 MED ORDER — FENTANYL CITRATE (PF) 100 MCG/2ML IJ SOLN: 50.0000 ug | Freq: Once | INTRAMUSCULAR | Status: DC

## 2019-12-03 MED ORDER — METHYLPREDNISOLONE SODIUM SUCC 125 MG IJ SOLR
125.0000 mg | Freq: Once | INTRAMUSCULAR | Status: AC
  Administered 2019-12-03: 125 mg via INTRAVENOUS
  Filled 2019-12-03: qty 2

## 2019-12-03 MED ORDER — IPRATROPIUM-ALBUTEROL 0.5-2.5 (3) MG/3ML IN SOLN
3.0000 mL | Freq: Once | RESPIRATORY_TRACT | Status: AC
  Administered 2019-12-03: 3 mL via RESPIRATORY_TRACT
  Filled 2019-12-03: qty 3

## 2019-12-03 MED ORDER — IPRATROPIUM-ALBUTEROL 0.5-2.5 (3) MG/3ML IN SOLN
3.0000 mL | RESPIRATORY_TRACT | Status: DC
  Administered 2019-12-04 (×3): 3 mL via RESPIRATORY_TRACT
  Filled 2019-12-03 (×3): qty 3

## 2019-12-03 MED ORDER — ONDANSETRON HCL 4 MG/2ML IJ SOLN: 4.0000 mg | Freq: Four times a day (QID) | INTRAMUSCULAR | Status: DC | PRN

## 2019-12-03 MED ORDER — MIDAZOLAM HCL 2 MG/2ML IJ SOLN: 1.0000 mg | INTRAMUSCULAR | Status: DC | PRN

## 2019-12-03 MED ORDER — SODIUM CHLORIDE 0.9 % IV BOLUS
1000.0000 mL | Freq: Once | INTRAVENOUS | Status: AC
  Administered 2019-12-03: 23:00:00 1000 mL via INTRAVENOUS

## 2019-12-03 MED ORDER — VANCOMYCIN HCL IN DEXTROSE 1-5 GM/200ML-% IV SOLN
1000.0000 mg | Freq: Once | INTRAVENOUS | Status: AC
  Administered 2019-12-04: 1000 mg via INTRAVENOUS
  Filled 2019-12-03: qty 200

## 2019-12-03 MED ORDER — MIDAZOLAM HCL 2 MG/2ML IJ SOLN
1.0000 mg | INTRAMUSCULAR | Status: DC | PRN
  Administered 2019-12-04: 1 mg via INTRAVENOUS
  Filled 2019-12-03: qty 2

## 2019-12-03 MED ORDER — SODIUM BICARBONATE 8.4 % IV SOLN
INTRAVENOUS | Status: AC | PRN
  Administered 2019-12-03: 50 meq via INTRAVENOUS

## 2019-12-03 MED ORDER — SODIUM CHLORIDE 0.9 % IV SOLN
2.0000 g | Freq: Once | INTRAVENOUS | Status: AC
  Administered 2019-12-03: 2 g via INTRAVENOUS
  Filled 2019-12-03: qty 2

## 2019-12-03 MED ORDER — AMIODARONE LOAD VIA INFUSION
150.0000 mg | Freq: Once | INTRAVENOUS | Status: DC
  Filled 2019-12-03: qty 83.34

## 2019-12-03 MED ORDER — MIDAZOLAM HCL 2 MG/2ML IJ SOLN: 2.0000 mg | INTRAMUSCULAR | Status: DC | PRN

## 2019-12-03 MED ORDER — FAMOTIDINE IN NACL 20-0.9 MG/50ML-% IV SOLN
20.0000 mg | Freq: Two times a day (BID) | INTRAVENOUS | Status: DC
  Administered 2019-12-04: 03:00:00 20 mg via INTRAVENOUS
  Filled 2019-12-03: qty 50

## 2019-12-03 MED ORDER — ALBUTEROL SULFATE (2.5 MG/3ML) 0.083% IN NEBU
INHALATION_SOLUTION | RESPIRATORY_TRACT | Status: AC
  Filled 2019-12-03: qty 3

## 2019-12-03 MED ORDER — AMIODARONE HCL IN DEXTROSE 360-4.14 MG/200ML-% IV SOLN
60.0000 mg/h | INTRAVENOUS | Status: DC
  Administered 2019-12-03: 60 mg/h via INTRAVENOUS
  Filled 2019-12-03 (×2): qty 200

## 2019-12-03 MED ORDER — AMIODARONE HCL IN DEXTROSE 360-4.14 MG/200ML-% IV SOLN
30.0000 mg/h | INTRAVENOUS | Status: DC
  Administered 2019-12-04: 30 mg/h via INTRAVENOUS
  Filled 2019-12-03: qty 200

## 2019-12-03 MED ORDER — FENTANYL BOLUS VIA INFUSION
25.0000 ug | INTRAVENOUS | Status: DC | PRN
  Administered 2019-12-04: 25 ug via INTRAVENOUS
  Filled 2019-12-03: qty 25

## 2019-12-03 MED ORDER — BUDESONIDE 0.5 MG/2ML IN SUSP
0.5000 mg | Freq: Two times a day (BID) | RESPIRATORY_TRACT | Status: DC
  Administered 2019-12-04: 0.5 mg via RESPIRATORY_TRACT
  Filled 2019-12-03: qty 2

## 2019-12-03 MED ORDER — PROPOFOL 1000 MG/100ML IV EMUL: 0.0000 ug/kg/min | INTRAVENOUS | Status: DC

## 2019-12-03 MED ORDER — SODIUM CHLORIDE 0.9 % IV BOLUS
1000.0000 mL | Freq: Once | INTRAVENOUS | Status: AC
  Administered 2019-12-03: 22:00:00 1000 mL via INTRAVENOUS

## 2019-12-03 MED ORDER — AMIODARONE HCL 150 MG/3ML IV SOLN
INTRAVENOUS | Status: AC | PRN
  Administered 2019-12-03: 150 mg via INTRAVENOUS

## 2019-12-03 MED ORDER — ENOXAPARIN SODIUM 40 MG/0.4ML ~~LOC~~ SOLN
40.0000 mg | SUBCUTANEOUS | Status: DC
  Administered 2019-12-04: 40 mg via SUBCUTANEOUS
  Filled 2019-12-03: qty 0.4

## 2019-12-03 MED ORDER — METHYLPREDNISOLONE SODIUM SUCC 40 MG IJ SOLR: 40.0000 mg | Freq: Two times a day (BID) | INTRAMUSCULAR | Status: DC

## 2019-12-03 MED ORDER — IPRATROPIUM-ALBUTEROL 0.5-2.5 (3) MG/3ML IN SOLN: 3.0000 mL | RESPIRATORY_TRACT | Status: DC | PRN

## 2019-12-03 MED ORDER — CHLORHEXIDINE GLUCONATE CLOTH 2 % EX PADS
6.0000 | MEDICATED_PAD | Freq: Every day | CUTANEOUS | Status: DC
  Administered 2019-12-04: 22:00:00 6 via TOPICAL
  Filled 2019-12-03: qty 6

## 2019-12-03 MED ORDER — FENTANYL CITRATE (PF) 100 MCG/2ML IJ SOLN: 50.0000 ug | INTRAMUSCULAR | Status: DC | PRN

## 2019-12-03 MED ORDER — CALCIUM CHLORIDE 10 % IV SOLN
INTRAVENOUS | Status: AC | PRN
  Administered 2019-12-03: 1 g via INTRAVENOUS

## 2019-12-03 MED ORDER — ACETAMINOPHEN 325 MG PO TABS: 650.0000 mg | ORAL_TABLET | ORAL | Status: DC | PRN

## 2019-12-03 MED ORDER — METRONIDAZOLE IN NACL 5-0.79 MG/ML-% IV SOLN
500.0000 mg | Freq: Once | INTRAVENOUS | Status: AC
  Administered 2019-12-03: 23:00:00 500 mg via INTRAVENOUS
  Filled 2019-12-03: qty 100

## 2019-12-03 MED ORDER — FENTANYL 2500MCG IN NS 250ML (10MCG/ML) PREMIX INFUSION
25.0000 ug/h | INTRAVENOUS | Status: DC
  Administered 2019-12-03: 50 ug/h via INTRAVENOUS
  Filled 2019-12-03: qty 250

## 2019-12-03 MED ORDER — FENTANYL CITRATE (PF) 100 MCG/2ML IJ SOLN: 25.0000 ug | INTRAMUSCULAR | Status: DC | PRN

## 2019-12-03 NOTE — ED Notes (Signed)
Date and time results received: 12/03/19 2157 (use smartphrase ".now" to insert current time)  Test: Lactic Critical Value: 3.0  Name of Provider Notified: [facility]  Orders Received? Or Actions Taken?: NA

## 2019-12-03 NOTE — ED Notes (Signed)
RT called to get arterial blood gas

## 2019-12-03 NOTE — ED Provider Notes (Signed)
[facility] Emergency Department Provider Note  ____________________________________________   First MD Initiated Contact with Patient 12/03/19 2112     (approximate)  I have reviewed the triage vital signs and the nursing notes.   HISTORY  Chief Complaint Respiratory Distress    HPI Sarah White is a 74 y.o. female with past medical history as below here with respiratory failure.   On arrival, patient essentially GCS 3 so unable to provide history.  Per EMS report, they were called to the patient's house today for shortness of breath.  The patient had reportedly been complaining of increasing shortness of breath for last several days.  Initially, they found her tachypneic but able to converse.  However, in route, she became decreasing responsiveness, with eventual agonal respirations.  She arrives with BVM in place.  She has not lost pulses.  Remainder of history limited due to condition.  Level 5 caveat invoked as remainder of history, ROS, and physical exam limited due to patient's severity of illness, unresponsiveness.        Past Medical History:  Diagnosis Date  . Acute pancreatitis 09/09/2018  . Acute respiratory failure with hypoxia and hypercarbia ([place]) 07/22/2015  . Cancer ([place])    uterine  . CHF (congestive heart failure) ([place])   . COPD (chronic obstructive pulmonary disease) ([place])   . Diabetes mellitus without complication ([place])   . Encephalopathy acute 07/22/2015  . Hypertension   . Pneumonia    November 2016  . Pulmonary edema 07/22/2015  . 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 Acute respiratory failure with hypoxia and hypercarbia ([place]) 12/03/2019  . Acute on chronic respiratory failure with hypoxia ([place]) 05/15/2019  . Insomnia 05/05/2018  . Squamous cell carcinoma of arm, right 01/07/2017  . Depression, major, single episode, moderate ([place]) 12/01/2016  .  Unstable angina ([place]) 11/26/2016  . Near syncope 03/11/2016  . Hypokalemia 03/11/2016  . Orthostatic hypotension 08/12/2015  . Hyperlipidemia 07/29/2015  . Acute systolic CHF (congestive heart failure) ([place]) 07/25/2015  . Hypertensive renal disease 07/25/2015  . Low back pain 07/25/2015  . Smoker 07/22/2015  . COPD (chronic obstructive pulmonary disease) ([place]) 07/22/2015  . Diabetes mellitus with chronic kidney disease ([place]) 07/22/2015  . CAD (coronary artery disease) 07/22/2015    Past Surgical History:  Procedure Laterality Date  . ABDOMINAL HYSTERECTOMY    . CARPAL TUNNEL RELEASE Bilateral   . CESAREAN SECTION    . CHOLECYSTECTOMY N/A 09/12/2018   Procedure: LAPAROSCOPIC CHOLECYSTECTOMY WITH INTRAOPERATIVE CHOLANGIOGRAM;  Surgeon: Herbert Pun, MD;  Location: [facility] ORS;  Service: General;  Laterality: N/A;  . CORONARY ANGIOPLASTY WITH STENT PLACEMENT    . KNEE SURGERY Left   . LEFT HEART CATH AND CORONARY ANGIOGRAPHY Right 11/26/2016   Procedure: Left Heart Cath and Coronary Angiography;  Surgeon: Isaias Cowman, MD;  Location: [place] [facility];  Service: Cardiovascular;  Laterality: Right;    Prior to Admission medications   Medication Sig Start Date End Date Taking? Authorizing Provider  albuterol (VENTOLIN HFA) 108 (90 Base) MCG/ACT inhaler Inhale 2 puffs into the lungs every 6 (six) hours as needed for wheezing or shortness of breath. 09/20/19   Park Liter P, DO  aspirin EC 81 MG tablet Take 81 mg by mouth at bedtime.     [provider]  budesonide-formoterol (SYMBICORT) 160-4.5 MCG/ACT inhaler Inhale 2 puffs into the lungs 2 (two)  times daily. 09/20/19   Johnson, [name], DO  carvedilol (COREG) 12.5 MG tablet TAKE 2 TABLETS BY MOUTH TWICE DAILY WITH A MEAL 08/21/19   Johnson, [name], DO  cyclobenzaprine (FLEXERIL) 10 MG tablet Take 1 tablet (10 mg total) by mouth 3 (three) times daily as needed for muscle spasms. DO NOT DRIVE ON THIS MEDICINE- WILL  MAKE YOU SLEEPY 08/21/19   Johnson, [name], DO  Dulaglutide (TRULICITY) 1.5 GM/0.1UU SOPN Inject 1.5 mg into the skin once a week. 08/21/19   Johnson, [name], DO  furosemide (LASIX) 40 MG tablet Take 1 tablet (40 mg total) by mouth daily. 05/22/19   Johnson, [name], DO  isosorbide mononitrate (IMDUR) 60 MG 24 hr tablet Take 1 tablet (60 mg total) by mouth daily. 08/21/19   Park Liter P, DO  Multiple Vitamin (MULTIVITAMIN WITH MINERALS) TABS tablet Take 1 tablet by mouth daily.    [provider]  nitroGLYCERIN (NITROSTAT) 0.4 MG SL tablet Place 1 tablet (0.4 mg total) under the tongue every 5 (five) minutes as needed for chest pain. Patient not taking: Reported on 10/25/2019 03/24/18   Park Liter P, DO  nystatin (MYCOSTATIN) 100000 UNIT/ML suspension Take 5 mLs (500,000 Units total) by mouth 4 (four) times daily. 10/18/19   Volney American, PA-C  ondansetron (ZOFRAN-ODT) 4 MG disintegrating tablet Take 4 mg by mouth every 8 (eight) hours as needed for nausea or vomiting.    [provider]  rosuvastatin (CRESTOR) 20 MG tablet Take 1 tablet (20 mg total) by mouth daily. 09/20/19   Johnson, [name], DO  sertraline (ZOLOFT) 100 MG tablet Take 2 tablets (200 mg total) by mouth daily. 08/21/19   Johnson, [name], DO  tiotropium (SPIRIVA HANDIHALER) 18 MCG inhalation capsule Place 1 capsule (18 mcg total) into inhaler and inhale daily. 02/21/19   Johnson, [name], DO  traZODone (DESYREL) 50 MG tablet TAKE 1/2 TO 1 (ONE-HALF TO ONE) TABLET BY MOUTH AT BEDTIME AS NEEDED FOR SLEEP 11/15/19   Johnson, [name], DO  triamcinolone ointment (KENALOG) 0.5 % Apply 1 application topically 2 (two) times daily. 08/21/19   Park Liter P, DO    Allergies Amlodipine, Atorvastatin, Codeine, Hctz [hydrochlorothiazide], Lisinopril, and Metformin and related  Family History  Problem Relation Age of Onset  . Heart disease Mother     Social History Social History   Tobacco Use  .  Smoking status: Current Every Day Smoker    Packs/day: 0.50    Types: Cigarettes  . Smokeless tobacco: Never Used  Substance Use Topics  . Alcohol use: No  . Drug use: No    Review of Systems  Review of Systems  Unable to perform ROS: Patient unresponsive     ____________________________________________  PHYSICAL EXAM:      VITAL SIGNS: ED Triage Vitals  Enc Vitals Group     BP 12/03/19 2101 (!) 182/157     Pulse Rate 12/03/19 2101 (!) 110     Resp 12/03/19 2107 20     Temp 12/03/19 2130 97.8 F (36.6 C)     Temp src --      SpO2 12/03/19 2101 99 %     Weight 12/03/19 2108 179 lb 14.3 oz (81.6 kg)     Height 12/03/19 2108 _0  (1.702 m)     Head Circumference --      Peak Flow --      Pain Score 12/03/19 2108 0     Pain  Loc --      Pain Edu? --      Excl. in [place]? --      Physical Exam Vitals and nursing note reviewed.  Constitutional:      General: She is not in acute distress.    Appearance: She is well-developed. She is obese. She is ill-appearing.  HENT:     Head: Normocephalic and atraumatic.  Eyes:     Conjunctiva/sclera: Conjunctivae normal.  Cardiovascular:     Rate and Rhythm: Regular rhythm. Tachycardia present.     Heart sounds: Normal heart sounds.  Pulmonary:     Effort: Pulmonary effort is normal. Tachypnea present. No respiratory distress.     Breath sounds: Decreased air movement present. Decreased breath sounds present. No wheezing.     Comments: Agonal respirations, markedly diminished breath sounds Abdominal:     General: There is no distension.  Musculoskeletal:     Cervical back: Neck supple.  Skin:    General: Skin is warm.     Capillary Refill: Capillary refill takes less than 2 seconds.     Findings: No rash.  Neurological:     Mental Status: She is lethargic.     GCS: GCS eye subscore is 1. GCS verbal subscore is 1. GCS motor subscore is 1.     Motor: No abnormal muscle tone.        ____________________________________________   LABS (all labs ordered are listed, but only abnormal results are displayed)  Labs Reviewed  CBC WITH DIFFERENTIAL/PLATELET - Abnormal; Notable for the following components:      Result Value   WBC 11.9 (*)    RBC 5.42 (*)    HCT 47.4 (*)    Lymphs Abs 4.9 (*)    All other components within normal limits  COMPREHENSIVE METABOLIC PANEL - Abnormal; Notable for the following components:   Glucose, Bld 203 (*)    AST 67 (*)    ALT 49 (*)    All other components within normal limits  BLOOD GAS, VENOUS - Abnormal; Notable for the following components:   pH, Ven 7.10 (*)    pCO2, Ven 101 (*)    pO2, Ven 72.0 (*)    Bicarbonate 31.4 (*)    All other components within normal limits  LACTIC ACID, PLASMA - Abnormal; Notable for the following components:   Lactic Acid, Venous 3.0 (*)    All other components within normal limits  BRAIN NATRIURETIC PEPTIDE - Abnormal; Notable for the following components:   B Natriuretic Peptide 578.0 (*)    All other components within normal limits  GLUCOSE, CAPILLARY - Abnormal; Notable for the following components:   Glucose-Capillary 167 (*)    All other components within normal limits  TRIGLYCERIDES - Abnormal; Notable for the following components:   Triglycerides 288 (*)    All other components within normal limits  URINALYSIS, ROUTINE W REFLEX MICROSCOPIC - Abnormal; Notable for the following components:   Color, Urine YELLOW (*)    APPearance CLEAR (*)    Protein, ur 100 (*)    All other components within normal limits  TRIGLYCERIDES - Abnormal; Notable for the following components:   Triglycerides 163 (*)    All other components within normal limits  TROPONIN I (HIGH SENSITIVITY) - Abnormal; Notable for the following components:   Troponin I (High Sensitivity) 32 (*)    All other components within normal limits  RESPIRATORY PANEL BY RT PCR (FLU A&B, COVID)  CULTURE, BLOOD (ROUTINE X 2)  CULTURE, BLOOD (ROUTINE X 2)  URINE CULTURE  CULTURE, RESPIRATORY  MRSA PCR SCREENING  RESPIRATORY PANEL BY PCR  MAGNESIUM  LACTIC ACID, PLASMA  PROCALCITONIN  BLOOD GAS, ARTERIAL  PROCALCITONIN  CBC WITH DIFFERENTIAL/PLATELET  COMPREHENSIVE METABOLIC PANEL  BRAIN NATRIURETIC PEPTIDE  STREP PNEUMONIAE URINARY ANTIGEN  LEGIONELLA PNEUMOPHILA SEROGP 1 UR AG  BLOOD GAS, ARTERIAL  TROPONIN I (HIGH SENSITIVITY)    ____________________________________________  EKG: Normal sinus rhythm, ventricular rate 69.  QRS 157, QTc 467.  Left bundle branch block noted.  Slight peaking of T waves diffusely.  When compared to prior, no acute ischemic changes. ________________________________________  RADIOLOGY All imaging, including plain films, CT scans, and ultrasounds, independently reviewed by me, and interpretations confirmed via formal radiology reads.  ED MD interpretation:   Chest x-ray: Endotracheal tube in place, bilateral pleural effusions, pulmonary vascular congestion CT head: No acute abnormality   Official radiology report(s): CT Head Wo Contrast  Result Date: 12/03/2019 CLINICAL DATA:  Respiratory distress.  Became unresponsive. EXAM: CT HEAD WITHOUT CONTRAST TECHNIQUE: Contiguous axial images were obtained from the base of the skull through the vertex without intravenous contrast. COMPARISON:  None. FINDINGS: Brain: No acute intracranial abnormality. Specifically, no hemorrhage, hydrocephalus, mass lesion, acute infarction, or significant intracranial injury. Vascular: No hyperdense vessel or unexpected calcification. Skull: No acute calvarial abnormality. Sinuses/Orbits: Visualized paranasal sinuses and mastoids clear. Orbital soft tissues unremarkable. Other: None IMPRESSION: No acute intracranial abnormality. Electronically Signed   By: Rolm Baptise M.D.   On: 12/03/2019 21:56   DG Chest Portable 1 View  Result Date: 12/03/2019 CLINICAL DATA:  Intubation EXAM: PORTABLE CHEST 1  VIEW COMPARISON:  05/15/2019 FINDINGS: The endotracheal tube terminates approximately 5.6 cm above the carina. The enteric tube extends below the left hemidiaphragm and terminates over the gastric body. There is persistent cardiomegaly. Prominent interstitial lung markings are noted. There is no pneumothorax. There may be trace to small bilateral pleural effusions. Aortic calcifications are noted. There is no acute osseous abnormality. IMPRESSION: 1. Endotracheal tube terminates approximately 5.6 cm above the carina. 2. Persistent cardiomegaly and pulmonary vascular congestion. 3. Possible trace to small bilateral pleural effusions. Electronically Signed   By: Constance Holster M.D.   On: 12/03/2019 21:24    ____________________________________________  PROCEDURES   Procedure(s) performed (including Critical Care):  .Critical Care Performed by: Duffy Bruce, MD Authorized by: Duffy Bruce, MD   Critical care provider statement:    Critical care time (minutes):  45   Critical care time was exclusive of:  Separately billable procedures and treating other patients and teaching time   Critical care was necessary to treat or prevent imminent or life-threatening deterioration of the following conditions:  Respiratory failure, circulatory failure, cardiac failure and sepsis   Critical care was time spent personally by me on the following activities:  Development of treatment plan with patient or surrogate, discussions with consultants, evaluation of patient's response to treatment, examination of patient, obtaining history from patient or surrogate, ordering and performing treatments and interventions, ordering and review of laboratory studies, ordering and review of radiographic studies, pulse oximetry, re-evaluation of patient's condition and review of old charts   I assumed direction of critical care for this patient from another provider in my specialty: no   .1-3 Lead EKG  Interpretation Performed by: Duffy Bruce, MD Authorized by: Duffy Bruce, MD     Interpretation: abnormal     ECG rate:  60s, intermittently 130-140s   ECG rate assessment: normal  Rhythm: sinus tachycardia     Ectopy: PVCs     Conduction: normal   Comments:     Indication: Respiratory failure Procedure Name: Intubation Date/Time: 12/04/2019 1:22 AM Performed by: Duffy Bruce, MD Pre-anesthesia Checklist: Patient identified, Patient being monitored, Emergency Drugs available, Timeout performed and Suction available Oxygen Delivery Method: Non-rebreather mask Preoxygenation: Pre-oxygenation with 100% oxygen Induction Type: Rapid sequence Ventilation: Mask ventilation without difficulty Laryngoscope Size: Mac and 4 Grade View: Grade I Tube size: 7.5 mm Number of attempts: 1 Placement Confirmation: ETT inserted through vocal cords under direct vision,  CO2 detector and Breath sounds checked- equal and bilateral Secured at: 22 cm Tube secured with: ETT holder Dental Injury: Teeth and Oropharynx as per pre-operative assessment  Difficulty Due To: Difficulty was unanticipated Future Recommendations: Recommend- induction with short-acting agent, and alternative techniques readily available       ____________________________________________  INITIAL IMPRESSION / MDM / ASSESSMENT AND PLAN / ED COURSE  As part of my medical decision making, I reviewed the following data within the [facility] notes reviewed and incorporated, Old chart reviewed, Notes from prior ED visits, and [place] Controlled Substance Database       *IDONA STACH was evaluated in Emergency Department on 12/04/2019 for the symptoms described in the history of present illness. She was evaluated in the context of the global COVID-19 pandemic, which necessitated consideration that the patient might be at risk for infection with the SARS-CoV-2 virus that causes COVID-19. Institutional  protocols and algorithms that pertain to the evaluation of patients at risk for COVID-19 are in a state of rapid change based on information released by regulatory bodies including the CDC and federal and state organizations. These policies and algorithms were followed during the patient's care in the ED.  Some ED evaluations and interventions may be delayed as a result of limited staffing during the pandemic.*     Medical Decision Making:  74 yo F here with acute hypoxic resp failure and AMS. On arrival, pt intubated for GCS 3 with hypoxia. Initial gas shows severe hypercapnic resp failure. Shortly after intubation, pt noted to have what appeared to be a run of VTach at 140s, so was given amio bolus and drip. However, I suspect this was more so 2/2 a transient AFib or flutter episode with LBBB. Trop pending. No STEMI. CXR shows ETT in place, CT head is neg. Suspect her AMS is 2/2 her profound acidosis but will need to be monitored. Discussed with son who confirmed she is full code. Will start steroids, nebs, adjust vent accordingly. Low-grade temp noted to pt given ABX. LA 3 - likely 2/2 her resp failure but 1 L NS given - will be cautious in setting of CHF  ____________________________________________  FINAL CLINICAL IMPRESSION(S) / ED DIAGNOSES  Final diagnoses:  Acute respiratory failure with hypoxia and hypercapnia (HCC)  Fever in adult  Lactic acidosis  Encephalopathy     MEDICATIONS GIVEN DURING THIS VISIT:  Medications  amiodarone (NEXTERONE) 1.8 mg/mL load via infusion 150 mg (0 mg Intravenous Hold 12/03/19 2129)    Followed by  amiodarone (NEXTERONE PREMIX) 360-4.14 MG/200ML-% (1.8 mg/mL) IV infusion (60 mg/hr Intravenous New Bag/Given 12/03/19 2125)    Followed by  amiodarone (NEXTERONE PREMIX) 360-4.14 MG/200ML-% (1.8 mg/mL) IV infusion (has no administration in time range)  albuterol (PROVENTIL) (2.5 MG/3ML) 0.083% nebulizer solution (has no administration in time range)  fentaNYL  2568mg in NS 2528m(1026mml) infusion-PREMIX (50 mcg/hr Intravenous New Bag/Given  12/03/19 2254)  fentaNYL (SUBLIMAZE) bolus via infusion 25 mcg (25 mcg Intravenous Bolus from Bag 12/04/19 0028)  vancomycin (VANCOCIN) IVPB 1000 mg/200 mL premix (1,000 mg Intravenous New Bag/Given 12/04/19 0029)  Chlorhexidine Gluconate Cloth 2 % PADS 6 each (has no administration in time range)  enoxaparin (LOVENOX) injection 40 mg (has no administration in time range)  famotidine (PEPCID) IVPB 20 mg premix (has no administration in time range)  acetaminophen (TYLENOL) tablet 650 mg (has no administration in time range)  ondansetron (ZOFRAN) injection 4 mg (has no administration in time range)  midazolam (VERSED) injection 1 mg (1 mg Intravenous Given 12/04/19 0027)  midazolam (VERSED) injection 1 mg (has no administration in time range)  ipratropium-albuterol (DUONEB) 0.5-2.5 (3) MG/3ML nebulizer solution 3 mL (3 mLs Nebulization Given 12/04/19 0059)  budesonide (PULMICORT) nebulizer solution 0.5 mg (has no administration in time range)  methylPREDNISolone sodium succinate (SOLU-MEDROL) 40 mg/mL injection 40 mg (has no administration in time range)  ipratropium-albuterol (DUONEB) 0.5-2.5 (3) MG/3ML nebulizer solution 3 mL (has no administration in time range)  propofol (DIPRIVAN) 1000 MG/100ML infusion (30 mcg/kg/min  81.2 kg Intravenous Rate/Dose Change 12/04/19 0057)  ipratropium-albuterol (DUONEB) 0.5-2.5 (3) MG/3ML nebulizer solution 3 mL (3 mLs Nebulization Given 12/03/19 2126)  ipratropium-albuterol (DUONEB) 0.5-2.5 (3) MG/3ML nebulizer solution 3 mL (3 mLs Nebulization Given 12/03/19 2126)  ipratropium-albuterol (DUONEB) 0.5-2.5 (3) MG/3ML nebulizer solution 3 mL (3 mLs Nebulization Given 12/03/19 2126)  amiodarone (CORDARONE) injection (150 mg Intravenous Given 12/03/19 2109)  sodium bicarbonate injection (50 mEq Intravenous Canceled Entry 12/03/19 2306)  calcium chloride injection (1 g Intravenous Canceled Entry 12/03/19  2306)  ceFEPIme (MAXIPIME) 2 g in sodium chloride 0.9 % 100 mL IVPB (0 g Intravenous Stopped 12/03/19 2255)  metroNIDAZOLE (FLAGYL) IVPB 500 mg (0 mg Intravenous Stopped 12/03/19 2339)  vancomycin (VANCOCIN) IVPB 1000 mg/200 mL premix (0 mg Intravenous Stopped 12/04/19 0043)  sodium chloride 0.9 % bolus 1,000 mL (0 mLs Intravenous Stopped 12/03/19 2255)  methylPREDNISolone sodium succinate (SOLU-MEDROL) 125 mg/2 mL injection 125 mg (125 mg Intravenous Given 12/03/19 2214)  sodium chloride 0.9 % bolus 1,000 mL (1,000 mLs Intravenous New Bag/Given 12/03/19 2325)     ED Discharge Orders    None       Note:  This document was prepared using Dragon voice recognition software and may include unintentional dictation errors.   Duffy Bruce, MD 12/04/19 [phone]

## 2019-12-03 NOTE — ED Triage Notes (Addendum)
Pt arrives [facility] from home w cc of respiratory distress. Pt was alert GCS 15 upon EMS arrival, 50-60% room air, ETCO2 65%.  Pt placed on nonrebreather w no improvement, p-t then placed on CPAP by EMS and became unresponsive. Began bagging. Hx CHF, COPD, asthma, stents 20G L forearm  21:00 Upon arrival patient is unresponsive and no longer responsive to pain. 18G placed in R AC  BS 167 21:03 15 etomidate given, 80 rocc given 21:05 decreased breath sounds, intubated 23" lips, pos color change 21:06 Pt entered vtach 21:08 Calcium gluconate given 1 g 2109 Amiodarone 150 given 2109 HR 109, bp 159/88 2111 HR 66  Pt with 18 G in R AC 20 G L FA

## 2019-12-03 NOTE — Progress Notes (Signed)
PHARMACY -  BRIEF ANTIBIOTIC NOTE   Pharmacy has received consult(s) for Vancomycin and Cefepime from an ED provider.  The patient's profile has been reviewed for ht/wt/allergies/indication/available labs.    One time order(s) placed for Cefepime 2gm and Vancomycin 2gm (1gm + 1gm)  Further antibiotics/pharmacy consults should be ordered by admitting physician if indicated.                       Thank you, Hart Robinsons A 12/03/2019  10:16 PM

## 2019-12-03 NOTE — Progress Notes (Signed)
CODE SEPSIS - PHARMACY COMMUNICATION  **Broad Spectrum Antibiotics should be administered within 1 hour of Sepsis diagnosis**  Time Code Sepsis Called/Page Received: 2202  Antibiotics Ordered: Cefepime, Flagyl, Vancomycin  Time of 1st antibiotic administration: 2226, Cefepime  Additional action taken by pharmacy: n/a  If necessary, Name of Provider/Nurse Contacted: n/a    Ena Dawley ,PharmD Clinical Pharmacist  12/03/2019  10:16 PM

## 2019-12-03 NOTE — ED Notes (Signed)
Pt to CT, family in family waiting room, chaplain present, EDP aware.

## 2019-12-03 NOTE — H&P (Signed)
Name: Sarah White MRN: [id number] DOB: August 05, 1946    ADMISSION DATE:  12/03/2019 CONSULTATION DATE:  12/03/2019  REFERRING MD :  Dr. Ellender Hose  CHIEF COMPLAINT:  Acute Respiratory Distress  BRIEF PATIENT DESCRIPTION:  74 y.o. Female admitted 4/4 with severe acute on chronic Hypoxic Hypercapnic Respiratory Failure secondary to Acute COPD Exacerbation & Acute Decompensated HFrEF requiring intubation.  SIGNIFICANT EVENTS  4/4: Presented to ED, required intubation in ED 4/5: Admitted to ICU  STUDIES:  4/4: CXR>>1. Endotracheal tube terminates approximately 5.6 cm above the Carina. 2. Persistent cardiomegaly and pulmonary vascular congestion. 3. Possible trace to small bilateral pleural effusions. 4/4: CT Head w/o contrast>>No acute intracranial abnormality. 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  Patient is currently intubated and sedated and no family is present, therefore history is obtained from ED and nursing notes.  Per notes EMS was called today as the patient was having progressive shortness of breath over the last several days.  EMS found her tachypneic, hypoxic with O2 sats in the 50's,  but she was able to converse.  In route to the hospital she became decreasingly responsive with eventual agonal respirations.  Upon arrival to the ED she was noted to have a GCS of 3, therefore she was  emergently intubated.  Initial blood gas showed severe hypoxic hypercapnic respiratory failure.  Shortly after intubation she developed what appeared to be ventricular tachycardia (never lost pulse) of which she was given amiodarone bolus and placed on amiodarone drip.  EKG revealed left bundle branch block (not new) and no ST changes.  Chest x-ray concerning for cardiomegaly and pulmonary vascular congestion.  CT head is negative for any acute intracranial abnormality.  Lab work revealed BNP 578, high-sensitivity troponin 11, lactic acid 3, WBC 11.9 with lymphocytosis, procalcitonin 0.11.  Her SARS-CoV-2 PCR and influenza PCR both are negative.  She was noted to be slightly febrile with temperature 100.4.  She was given 1 L IV fluids, broad-spectrum antibiotics,  125 mg of IV Solu-Medrol, and 3 duo nebs in the ED.  PCCM is asked admit the patient for further work-up and treatment of acute on chronic hypoxic hypercapnic respiratory failure secondary to acute decompensated HFrEF and acute COPD exacerbation.  PAST MEDICAL HISTORY :   has a past medical history of Acute pancreatitis (09/09/2018), Acute respiratory failure with hypoxia and hypercarbia (HCC) (07/22/2015), Cancer ([place]), CHF (congestive heart failure) ([place]), COPD (chronic obstructive pulmonary disease) ([place]), Diabetes mellitus without complication ([place]), Encephalopathy acute (07/22/2015), Hypertension, Pneumonia, Pulmonary edema (07/22/2015), and Squamous cell cancer of skin of upper arm, right.  has a past surgical history that includes Knee surgery (Left); Carpal tunnel release (Bilateral); Coronary angioplasty with stent; Abdominal hysterectomy; Cesarean section; LEFT HEART CATH AND CORONARY ANGIOGRAPHY (Right, 11/26/2016); and Cholecystectomy (N/A, 09/12/2018). Prior to Admission medications   Medication Sig Start Date End Date Taking? Authorizing Provider  albuterol (VENTOLIN HFA) 108 (90 Base) MCG/ACT inhaler Inhale 2 puffs into the  lungs  every 6 (six) hours as needed for wheezing or shortness of breath. 09/20/19   Park Liter P, DO  aspirin EC 81 MG tablet Take 81 mg by mouth at bedtime.     [provider]  budesonide-formoterol (SYMBICORT) 160-4.5 MCG/ACT inhaler Inhale 2 puffs into the lungs 2 (two) times daily. 09/20/19   Johnson, [name], DO  carvedilol (COREG) 12.5 MG tablet TAKE 2 TABLETS BY MOUTH TWICE DAILY WITH A MEAL 08/21/19   Johnson, [name], DO  cyclobenzaprine (FLEXERIL) 10 MG tablet Take 1 tablet (10 mg total) by mouth 3 (three) times daily as needed for muscle spasms. DO NOT DRIVE ON THIS MEDICINE- WILL MAKE YOU SLEEPY 08/21/19   Johnson, [name], DO  Dulaglutide (TRULICITY) 1.5 YH/0.6CB SOPN Inject 1.5 mg into the skin once a week. 08/21/19   Johnson, [name], DO  furosemide (LASIX) 40 MG tablet Take 1 tablet (40 mg total) by mouth daily. 05/22/19   Johnson, [name], DO  isosorbide mononitrate (IMDUR) 60 MG 24 hr tablet Take 1 tablet (60 mg total) by mouth daily. 08/21/19   Park Liter P, DO  Multiple Vitamin (MULTIVITAMIN WITH MINERALS) TABS tablet Take 1 tablet by mouth daily.    [provider]  nitroGLYCERIN (NITROSTAT) 0.4 MG SL tablet Place 1 tablet (0.4 mg total) under the tongue every 5 (five) minutes as needed for chest pain. Patient not taking: Reported on 10/25/2019 03/24/18   Park Liter P, DO  nystatin (MYCOSTATIN) 100000 UNIT/ML suspension Take 5 mLs (500,000 Units total) by mouth 4 (four) times daily. 10/18/19   Volney American, PA-C  ondansetron (ZOFRAN-ODT) 4 MG disintegrating tablet Take 4 mg by mouth every 8 (eight) hours as needed for nausea or vomiting.    [provider]  rosuvastatin (CRESTOR) 20 MG tablet Take 1 tablet (20 mg total) by mouth daily. 09/20/19   Johnson, [name], DO  sertraline (ZOLOFT) 100 MG tablet Take 2 tablets (200 mg total) by mouth daily. 08/21/19   Johnson, [name], DO  tiotropium (SPIRIVA HANDIHALER) 18 MCG inhalation capsule Place 1  capsule (18 mcg total) into inhaler and inhale daily. 02/21/19   Johnson, [name], DO  traZODone (DESYREL) 50 MG tablet TAKE 1/2 TO 1 (ONE-HALF TO ONE) TABLET BY MOUTH AT BEDTIME AS NEEDED FOR SLEEP 11/15/19   Johnson, [name], DO  triamcinolone ointment (KENALOG) 0.5 % Apply 1 application topically 2 (two) times daily. 08/21/19   Park Liter P, DO   Allergies  Allergen Reactions  . Amlodipine Swelling  . Atorvastatin Other (See Comments)    myalgia  . Codeine Nausea And Vomiting  . Hctz [Hydrochlorothiazide] Other (See Comments)    Hypokalemia  . Lisinopril Other (See Comments)    Dizziness   . Metformin And Related Nausea Only    Nausea, dizziness    FAMILY HISTORY:  family history includes Heart disease in her mother. SOCIAL HISTORY:  reports that she has been smoking cigarettes. She has been smoking about 0.50 packs per day. She has never used smokeless tobacco. She reports that she does not drink alcohol or use drugs.   COVID-19 DISASTER DECLARATION:  FULL CONTACT PHYSICAL EXAMINATION WAS NOT POSSIBLE DUE TO TREATMENT OF COVID-19 AND  CONSERVATION OF PERSONAL PROTECTIVE EQUIPMENT, LIMITED EXAM FINDINGS INCLUDE-  Patient assessed or the symptoms described in the history of present illness.  In the context of the Global COVID-19 pandemic, which necessitated consideration that the patient might be at risk for  infection with the SARS-CoV-2 virus that causes COVID-19, Institutional protocols and algorithms that pertain to the evaluation of patients at risk for COVID-19 are in a state of rapid change based on information released by regulatory bodies including the CDC and federal and state organizations. These policies and algorithms were followed during the patient's care while in hospital.  REVIEW OF SYSTEMS:   Unable to assess due to intubation and sedation  SUBJECTIVE:  Unable to assess due to intubation and sedation  VITAL SIGNS: Temp:  [97.7 F (36.5 C)-100.4 F (38  C)] 97.7 F (36.5 C) (04/04 2320) Pulse Rate:  [60-137] 90 (04/04 2320) Resp:  [15-28] 28 (04/04 2320) BP: (144-182)/(61-157) 145/61 (04/04 2320) SpO2:  [95 %-100 %] 96 % (04/04 2320) FiO2 (%):  [50 %-100 %] 50 % (04/04 2207) Weight:  [81.2 kg-81.6 kg] 81.2 kg (04/04 2202)  PHYSICAL EXAMINATION: General: Acutely ill-appearing obese female, laying in bed, intubated and sedated, no acute distress Neuro: Sedated, withdraws from pain, pupils PERRLA HEENT: Atraumatic, normocephalic, neck supple, no JVD Cardiovascular: Regular rate and rhythm, S1-S2, no murmurs, rubs, gallops Lungs: Diminished breath sounds bilaterally, vent assisted, even, nonlabored Abdomen: Obese, soft, nontender, nondistended, no guarding or rebound tenderness, bowel sounds positive x4 Musculoskeletal: No deformities, bilateral lower extremity edema Skin: Warm and dry, no obvious rashes, lesions, or ulcerations  Recent Labs  Lab 12/03/19 2110  NA 139  K 4.1  CL 101  CO2 23  BUN 11  CREATININE 0.93  GLUCOSE 203*   Recent Labs  Lab 12/03/19 2110  HGB 14.8  HCT 47.4*  WBC 11.9*  PLT 219   CT Head Wo Contrast  Result Date: 12/03/2019 CLINICAL DATA:  Respiratory distress.  Became unresponsive. EXAM: CT HEAD WITHOUT CONTRAST TECHNIQUE: Contiguous axial images were obtained from the base of the skull through the vertex without intravenous contrast. COMPARISON:  None. FINDINGS: Brain: No acute intracranial abnormality. Specifically, no hemorrhage, hydrocephalus, mass lesion, acute infarction, or significant intracranial injury. Vascular: No hyperdense vessel or unexpected calcification. Skull: No acute calvarial abnormality. Sinuses/Orbits: Visualized paranasal sinuses and mastoids clear. Orbital soft tissues unremarkable. Other: None IMPRESSION: No acute intracranial abnormality. Electronically Signed   By: Rolm Baptise M.D.   On: 12/03/2019 21:56   DG Chest Portable 1 View  Result Date: 12/03/2019 CLINICAL DATA:   Intubation EXAM: PORTABLE CHEST 1 VIEW COMPARISON:  05/15/2019 FINDINGS: The endotracheal tube terminates approximately 5.6 cm above the carina. The enteric tube extends below the left hemidiaphragm and terminates over the gastric body. There is persistent cardiomegaly. Prominent interstitial lung markings are noted. There is no pneumothorax. There may be trace to small bilateral pleural effusions. Aortic calcifications are noted. There is no acute osseous abnormality. IMPRESSION: 1. Endotracheal tube terminates approximately 5.6 cm above the carina. 2. Persistent cardiomegaly and pulmonary vascular congestion. 3. Possible trace to small bilateral pleural effusions. Electronically Signed   By: Constance Holster M.D.   On: 12/03/2019 21:24    ASSESSMENT / PLAN:  Acute on chronic hypoxic hypercapnic respiratory failure secondary to acute decompensated HFrEF & acute COPD exacerbation -Full vent support -Wean FiO2 and PEEP as tolerated to maintain O2 sats 88 to 92% -Follow intermittent chest x-ray and ABG as needed -VAP protocol -Spontaneous breathing trial when respiratory parameters met -Bronchodilators -IV steroids and Pulmicort nebs -Covid 19 PCR is negative -Check respiratory viral panel -IV Lasix as blood pressure and renal function permits   Acute decompensated HFrEF (EF 40-45% in September 2020)  Ventricular tachycardia -Continuous cardiac  monitoring -Maintain MAP greater than 65 -Cautious IV fluids -IV Lasix as blood pressure and renal function permits -Obtain 2D echocardiogram -Consult cardiology, appreciate input -Continue amiodarone infusion  Leukocytosis with lymphocytosis  -Monitor fever curve -Trend WBCs -Check procalcitonin, if elevated will start empiric anabiotic's for severe COPD   Exacerbation -Chest x-ray without infiltrate 4/4 -Urinalysis negative for UTI -Follow cultures as above -COVID-19 PCR and influenza PCR both negative -Check respiratory viral  panel -Received cefepime, Flagyl, vancomycin ED  Acute metabolic encephalopathy secondary to severe hypercapnia -CT head negative on 4/4 -Provide supportive care -Continue mechanical ventilation to correct hypercapnia -Fentanyl and propofol drips to maintain RASS of 0 to -1 -Daily wake-up assessment  Diabetes mellitus -CBGs -Sliding scale insulin -Follow ICU hypo/hyperglycemia protocol          Best practices: Disposition: ICU Goals of care: Full code (ED provider confirmed with patient's son) Stress ulcer prophylaxis: IV Pepcid VTE prophylaxis: Subcu Lovenox Updates: Patient's son updated by ED provider prior to transfer to ICU   Darel Hong, [facility] Pager: [phone]  12/03/2019, 11:47 PM

## 2019-12-03 NOTE — ED Notes (Signed)
Son came to room to see pt,. Son given verbal reassurance, son states that he may be called at any time. Phone numbers in demographics verified

## 2019-12-04 ENCOUNTER — Inpatient Hospital Stay: Payer: Medicare Other

## 2019-12-04 ENCOUNTER — Inpatient Hospital Stay
Admit: 2019-12-04 | Discharge: 2019-12-04 | Disposition: A | Discharge status: Home / Self Care | Payer: Medicare Other | Attending: Pulmonary Disease | Admitting: Pulmonary Disease

## 2019-12-04 LAB — COMPREHENSIVE METABOLIC PANEL
ALT: 103 U/L — ABNORMAL HIGH (ref 0–44)
AST: 104 U/L — ABNORMAL HIGH (ref 15–41)
Albumin: 3.4 g/dL — ABNORMAL LOW (ref 3.5–5.0)
Alkaline Phosphatase: 109 U/L (ref 38–126)
Anion gap: 10 (ref 5–15)
BUN: 13 mg/dL (ref 8–23)
CO2: 27 mmol/L (ref 22–32)
Calcium: 8.4 mg/dL — ABNORMAL LOW (ref 8.9–10.3)
Chloride: 103 mmol/L (ref 98–111)
Creatinine, Ser: 0.69 mg/dL (ref 0.44–1.00)
GFR calc Af Amer: >60 mL/min (ref >60)
GFR calc non Af Amer: >60 mL/min (ref >60)
Glucose, Bld: 169 mg/dL — ABNORMAL HIGH (ref 70–99)
Potassium: 3.4 mmol/L — ABNORMAL LOW (ref 3.5–5.1)
Sodium: 140 mmol/L (ref 135–145)
Total Bilirubin: 0.8 mg/dL (ref 0.3–1.2)
Total Protein: 5.9 g/dL — ABNORMAL LOW (ref 6.5–8.1)

## 2019-12-04 LAB — CBC WITH DIFFERENTIAL/PLATELET
Abs Immature Granulocytes: 0.08 10*3/uL — ABNORMAL HIGH (ref 0.00–0.07)
Basophils Absolute: 0 10*3/uL (ref 0.0–0.1)
Basophils Relative: 0 %
Eosinophils Absolute: 0 10*3/uL (ref 0.0–0.5)
Eosinophils Relative: 0 %
HCT: 38.6 % (ref 36.0–46.0)
Hemoglobin: 13 g/dL (ref 12.0–15.0)
Immature Granulocytes: 1 %
Lymphocytes Relative: 4 %
Lymphs Abs: 0.4 10*3/uL — ABNORMAL LOW (ref 0.7–4.0)
MCH: 28.4 pg (ref 26.0–34.0)
MCHC: 33.7 g/dL (ref 30.0–36.0)
MCV: 84.3 fL (ref 80.0–100.0)
Monocytes Absolute: 0.1 10*3/uL (ref 0.1–1.0)
Monocytes Relative: 1 %
Neutro Abs: 9.1 10*3/uL — ABNORMAL HIGH (ref 1.7–7.7)
Neutrophils Relative %: 94 %
Platelets: 154 10*3/uL (ref 150–400)
RBC: 4.58 MIL/uL (ref 3.87–5.11)
RDW: 15 % (ref 11.5–15.5)
WBC: 9.8 10*3/uL (ref 4.0–10.5)
nRBC: 0 % (ref 0.0–0.2)

## 2019-12-04 LAB — STREP PNEUMONIAE URINARY ANTIGEN: Strep Pneumo Urinary Antigen: NEGATIVE

## 2019-12-04 LAB — GLUCOSE, CAPILLARY
Glucose-Capillary: 179 mg/dL — ABNORMAL HIGH (ref 70–99)
Glucose-Capillary: 158 mg/dL — ABNORMAL HIGH (ref 70–99)
Glucose-Capillary: 134 mg/dL — ABNORMAL HIGH (ref 70–99)
Glucose-Capillary: 115 mg/dL — ABNORMAL HIGH (ref 70–99)
Glucose-Capillary: 106 mg/dL — ABNORMAL HIGH (ref 70–99)

## 2019-12-04 LAB — MAGNESIUM: Magnesium: 1.9 mg/dL (ref 1.7–2.4)

## 2019-12-04 LAB — BLOOD GAS, ARTERIAL
Acid-Base Excess: 3 mmol/L — ABNORMAL HIGH (ref 0.0–2.0)
Bicarbonate: 29 mmol/L — ABNORMAL HIGH (ref 20.0–28.0)
FIO2: 0.4
MECHVT: 450 mL
Mechanical Rate: 28
O2 Saturation: 96.4 %
PEEP: 5 cmH2O
Patient temperature: 37
RATE: 28 resp/min
pCO2 arterial: 49 mmHg — ABNORMAL HIGH (ref 32.0–48.0)
pH, Arterial: 7.38 (ref 7.350–7.450)
pO2, Arterial: 87 mmHg (ref 83.0–108.0)

## 2019-12-04 LAB — TROPONIN I (HIGH SENSITIVITY)
Troponin I (High Sensitivity): 32 ng/L — ABNORMAL HIGH (ref <18)
Troponin I (High Sensitivity): 61 ng/L — ABNORMAL HIGH (ref <18)

## 2019-12-04 LAB — BRAIN NATRIURETIC PEPTIDE: B Natriuretic Peptide: 777 pg/mL — ABNORMAL HIGH (ref 0.0–100.0)

## 2019-12-04 LAB — MRSA PCR SCREENING: MRSA by PCR: NEGATIVE

## 2019-12-04 LAB — PROCALCITONIN
Procalcitonin: 0.11 ng/mL
Procalcitonin: <0.1 ng/mL

## 2019-12-04 LAB — HEMOGLOBIN A1C
Hgb A1c MFr Bld: 6.1 % — ABNORMAL HIGH (ref 4.8–5.6)
Mean Plasma Glucose: 128.37 mg/dL

## 2019-12-04 LAB — TRIGLYCERIDES: Triglycerides: 163 mg/dL — ABNORMAL HIGH (ref <150)

## 2019-12-04 LAB — PHOSPHORUS: Phosphorus: 2.3 mg/dL — ABNORMAL LOW (ref 2.5–4.6)

## 2019-12-04 MED ORDER — AMIODARONE HCL 200 MG PO TABS: 200.0000 mg | ORAL_TABLET | Freq: Every day | ORAL | Status: DC

## 2019-12-04 MED ORDER — MORPHINE SULFATE (PF) 2 MG/ML IV SOLN: 2.0000 mg | Freq: Once | INTRAVENOUS | Status: AC

## 2019-12-04 MED ORDER — ROSUVASTATIN CALCIUM 10 MG PO TABS
20.0000 mg | ORAL_TABLET | Freq: Every day | ORAL | Status: DC
  Administered 2019-12-04 – 2019-12-06 (×3): 20 mg via ORAL
  Filled 2019-12-04 (×3): qty 2

## 2019-12-04 MED ORDER — ONDANSETRON HCL 4 MG/2ML IJ SOLN
INTRAMUSCULAR | Status: AC
  Administered 2019-12-04: 4 mg via INTRAVENOUS
  Filled 2019-12-04: qty 2

## 2019-12-04 MED ORDER — ORAL CARE MOUTH RINSE: 15.0000 mL | OROMUCOSAL | Status: DC

## 2019-12-04 MED ORDER — AMIODARONE HCL 200 MG PO TABS: 200.0000 mg | ORAL_TABLET | Freq: Two times a day (BID) | ORAL | Status: DC

## 2019-12-04 MED ORDER — FAMOTIDINE 20 MG PO TABS
20.0000 mg | ORAL_TABLET | Freq: Two times a day (BID) | ORAL | Status: DC
  Administered 2019-12-04 – 2019-12-07 (×6): 20 mg via ORAL
  Filled 2019-12-04 (×6): qty 1

## 2019-12-04 MED ORDER — INSULIN DETEMIR 100 UNIT/ML ~~LOC~~ SOLN
8.0000 [IU] | Freq: Every day | SUBCUTANEOUS | Status: DC
  Administered 2019-12-04 – 2019-12-07 (×4): 8 [IU] via SUBCUTANEOUS
  Filled 2019-12-04 (×5): qty 0.08

## 2019-12-04 MED ORDER — TRAZODONE HCL 50 MG PO TABS
50.0000 mg | ORAL_TABLET | Freq: Every evening | ORAL | Status: DC | PRN
  Administered 2019-12-04 – 2019-12-06 (×3): 50 mg via ORAL
  Filled 2019-12-04 (×3): qty 1

## 2019-12-04 MED ORDER — INSULIN ASPART 100 UNIT/ML ~~LOC~~ SOLN
0.0000 [IU] | SUBCUTANEOUS | Status: DC
  Administered 2019-12-04: 4 [IU] via SUBCUTANEOUS
  Administered 2019-12-04: 3 [IU] via SUBCUTANEOUS
  Filled 2019-12-04 (×2): qty 1

## 2019-12-04 MED ORDER — FUROSEMIDE 10 MG/ML IJ SOLN
20.0000 mg | Freq: Once | INTRAMUSCULAR | Status: AC
  Administered 2019-12-04: 20 mg via INTRAVENOUS
  Filled 2019-12-04: qty 2

## 2019-12-04 MED ORDER — ONDANSETRON HCL 4 MG/2ML IJ SOLN: 4.0000 mg | Freq: Once | INTRAMUSCULAR | Status: AC

## 2019-12-04 MED ORDER — FUROSEMIDE 10 MG/ML IJ SOLN
40.0000 mg | Freq: Every day | INTRAMUSCULAR | Status: DC
  Administered 2019-12-05 – 2019-12-07 (×3): 40 mg via INTRAVENOUS
  Filled 2019-12-04 (×3): qty 4

## 2019-12-04 MED ORDER — FUROSEMIDE 10 MG/ML IJ SOLN
20.0000 mg | Freq: Once | INTRAMUSCULAR | Status: AC
  Filled 2019-12-04: qty 2

## 2019-12-04 MED ORDER — ASPIRIN EC 81 MG PO TBEC
81.0000 mg | DELAYED_RELEASE_TABLET | Freq: Every day | ORAL | Status: DC
  Administered 2019-12-04 – 2019-12-07 (×4): 81 mg via ORAL
  Filled 2019-12-04 (×4): qty 1

## 2019-12-04 MED ORDER — INSULIN ASPART 100 UNIT/ML ~~LOC~~ SOLN: 0.0000 [IU] | Freq: Every day | SUBCUTANEOUS | Status: DC

## 2019-12-04 MED ORDER — CHLORHEXIDINE GLUCONATE 0.12% ORAL RINSE (MEDLINE KIT)
15.0000 mL | Freq: Two times a day (BID) | OROMUCOSAL | Status: DC
  Administered 2019-12-04: 15 mL via OROMUCOSAL

## 2019-12-04 MED ORDER — TIOTROPIUM BROMIDE MONOHYDRATE 18 MCG IN CAPS
18.0000 ug | ORAL_CAPSULE | Freq: Every day | RESPIRATORY_TRACT | Status: DC
  Administered 2019-12-04 – 2019-12-07 (×4): 18 ug via RESPIRATORY_TRACT
  Filled 2019-12-04: qty 5

## 2019-12-04 MED ORDER — AMIODARONE HCL 200 MG PO TABS
200.0000 mg | ORAL_TABLET | Freq: Two times a day (BID) | ORAL | Status: DC
  Administered 2019-12-04 – 2019-12-07 (×7): 200 mg via ORAL
  Filled 2019-12-04 (×7): qty 1

## 2019-12-04 MED ORDER — PROMETHAZINE HCL 25 MG/ML IJ SOLN
12.5000 mg | Freq: Once | INTRAMUSCULAR | Status: AC
  Administered 2019-12-04: 12.5 mg via INTRAVENOUS
  Filled 2019-12-04: qty 1

## 2019-12-04 MED ORDER — POTASSIUM CHLORIDE 20 MEQ PO PACK
40.0000 meq | PACK | Freq: Once | ORAL | Status: AC
  Administered 2019-12-04: 40 meq

## 2019-12-04 MED ORDER — PROPOFOL 1000 MG/100ML IV EMUL
INTRAVENOUS | Status: AC
  Filled 2019-12-04: qty 100

## 2019-12-04 MED ORDER — POTASSIUM CHLORIDE CRYS ER 20 MEQ PO TBCR
40.0000 meq | EXTENDED_RELEASE_TABLET | Freq: Once | ORAL | Status: AC
  Administered 2019-12-04: 40 meq via ORAL
  Filled 2019-12-04: qty 2

## 2019-12-04 MED ORDER — PREDNISONE 20 MG PO TABS
20.0000 mg | ORAL_TABLET | Freq: Every day | ORAL | Status: DC
  Administered 2019-12-05 – 2019-12-07 (×3): 20 mg via ORAL
  Filled 2019-12-04 (×3): qty 1

## 2019-12-04 MED ORDER — PROPOFOL 1000 MG/100ML IV EMUL
5.0000 ug/kg/min | INTRAVENOUS | Status: DC
  Administered 2019-12-04: 20 ug/kg/min via INTRAVENOUS
  Administered 2019-12-04: 30 ug/kg/min via INTRAVENOUS
  Filled 2019-12-04: qty 100

## 2019-12-04 MED ORDER — MORPHINE SULFATE (PF) 2 MG/ML IV SOLN
INTRAVENOUS | Status: AC
  Administered 2019-12-04: 2 mg via INTRAVENOUS
  Filled 2019-12-04: qty 1

## 2019-12-04 MED ORDER — CARVEDILOL 12.5 MG PO TABS
12.5000 mg | ORAL_TABLET | Freq: Two times a day (BID) | ORAL | Status: DC
  Administered 2019-12-04 – 2019-12-07 (×6): 12.5 mg via ORAL
  Filled 2019-12-04 (×6): qty 1

## 2019-12-04 MED ORDER — ENOXAPARIN SODIUM 40 MG/0.4ML ~~LOC~~ SOLN
40.0000 mg | SUBCUTANEOUS | Status: DC
  Administered 2019-12-05 – 2019-12-07 (×3): 40 mg via SUBCUTANEOUS
  Filled 2019-12-04 (×3): qty 0.4

## 2019-12-04 MED ORDER — SERTRALINE HCL 50 MG PO TABS
100.0000 mg | ORAL_TABLET | Freq: Every day | ORAL | Status: DC
  Administered 2019-12-04 – 2019-12-07 (×4): 100 mg via ORAL
  Filled 2019-12-04 (×4): qty 2

## 2019-12-04 MED ORDER — ENSURE MAX PROTEIN PO LIQD
11.0000 [oz_av] | Freq: Two times a day (BID) | ORAL | Status: DC
  Administered 2019-12-04 – 2019-12-07 (×7): 11 [oz_av] via ORAL
  Filled 2019-12-04: qty 330

## 2019-12-04 MED ORDER — MOMETASONE FURO-FORMOTEROL FUM 200-5 MCG/ACT IN AERO
2.0000 | INHALATION_SPRAY | Freq: Two times a day (BID) | RESPIRATORY_TRACT | Status: DC
  Administered 2019-12-04 – 2019-12-07 (×7): 2 via RESPIRATORY_TRACT
  Filled 2019-12-04: qty 8.8

## 2019-12-04 MED ORDER — FAMOTIDINE 20 MG PO TABS
20.0000 mg | ORAL_TABLET | Freq: Two times a day (BID) | ORAL | Status: DC
  Administered 2019-12-04: 20 mg
  Filled 2019-12-04: qty 1

## 2019-12-04 MED ORDER — INSULIN ASPART 100 UNIT/ML ~~LOC~~ SOLN
0.0000 [IU] | Freq: Three times a day (TID) | SUBCUTANEOUS | Status: DC
  Administered 2019-12-05: 3 [IU] via SUBCUTANEOUS
  Administered 2019-12-06: 2 [IU] via SUBCUTANEOUS
  Filled 2019-12-04 (×4): qty 1

## 2019-12-04 MED ORDER — FUROSEMIDE 10 MG/ML IJ SOLN
60.0000 mg | Freq: Once | INTRAMUSCULAR | Status: AC
  Administered 2019-12-04: 60 mg via INTRAVENOUS
  Filled 2019-12-04: qty 6

## 2019-12-04 NOTE — Progress Notes (Signed)
   12/03/19 2115  Clinical Encounter Type  Visited With Patient and family together  Visit Type Initial  Referral From Nurse  Consult/Referral To Chaplain  Spiritual Encounters  Spiritual Needs Emotional;Other (Comment)  CH was rounding with staff in ED when pt was admitted into ED-26. Pt was receiving care from several members of care team upon arrival. Pt's son arrived and this Pryor Curia remained with son until physician was able to share status of pt. Munson provided pastoral care by building rapport with son. Remained a hopeful presence. No further needs expressed at this time.

## 2019-12-04 NOTE — Progress Notes (Signed)
*  PRELIMINARY RESULTS* Echocardiogram 2D Echocardiogram has been performed.  Sarah White 12/04/2019, 9:23 PM

## 2019-12-04 NOTE — Plan of Care (Signed)
Discussed with patient plan of care for the evening, pain management and admission routine with some teach back displayed (patient could nod) at this time.

## 2019-12-04 NOTE — Progress Notes (Signed)
eLink Physician-Brief Progress Note Patient Name: Sarah White DOB: [birth year] MRN: [id number]   Date of Service  12/04/2019  HPI/Events of Note  55 F COPD, HFrEF 40-45%, DM presented with shortness of breath intubated for acute on chronic hypoxic and hypercapneic respiratory failure  eICU Interventions  Being managed as pneumonia, COPD and HF exacerbation     Intervention Category Major Interventions: Respiratory failure - evaluation and management Evaluation Type: New Patient Evaluation  Judd Lien 12/04/2019, 4:02 AM

## 2019-12-04 NOTE — Progress Notes (Signed)
Initial Nutrition Assessment  DOCUMENTATION CODES:   Not applicable  INTERVENTION:   RD will add supplements once diet advanced  NUTRITION DIAGNOSIS:   Increased nutrient needs related to catabolic illness(COPD) as evidenced by increased estimated needs.  GOAL:   Patient will meet greater than or equal to 90% of their needs  MONITOR:   Diet advancement, Labs, Weight trends, I & O's, Skin  REASON FOR ASSESSMENT:   Malnutrition Screening Tool    ASSESSMENT:   74 y.o. Female admitted 4/4 with severe acute on chronic Hypoxic Hypercapnic Respiratory Failure secondary to Acute COPD Exacerbation & Acute Decompensated HFrEF requiring intubation.   Pt ventilated at time of RD visit; pt now extubated. RD will monitor for diet advancement and add supplements as appropriate. RD will obtain nutrition related history at follow-up. Per chart, pt appears fairly weight stable at baseline.   Medications reviewed and include: lovenox, pepcid, lasix, prednisone, insulin  Labs reviewed: K 3.4(L), P 2.3(L), Mg 1.9 wnl, AST 104(H), ALT 103(H) BNP- 777(H) cbgs- 179, 158, 134 x 24 hrs AIC 6.1(H)- 4/5  NUTRITION - FOCUSED PHYSICAL EXAM:    Most Recent Value  Orbital Region  No depletion  Upper Arm Region  No depletion  Thoracic and Lumbar Region  No depletion  Buccal Region  No depletion  Temple Region  No depletion  Clavicle Bone Region  No depletion  Clavicle and Acromion Bone Region  No depletion  Scapular Bone Region  No depletion  Dorsal Hand  No depletion  Patellar Region  No depletion  Anterior Thigh Region  No depletion  Posterior Calf Region  No depletion  Edema (RD Assessment)  Mild  Hair  Reviewed  Eyes  Reviewed  Mouth  Reviewed  Skin  Reviewed  Nails  Reviewed     Diet Order:   Diet Order            Diet NPO time specified  Diet effective now             EDUCATION NEEDS:   Not appropriate for education at this time  Skin:  Skin Assessment: Reviewed RN  Assessment(ecchymosis, MASD)  Last BM:  pta  Height:   Ht Readings from Last 1 Encounters:  12/03/19 5\' 5"  (1.651 m)    Weight:   Wt Readings from Last 1 Encounters:  12/04/19 83 kg    Ideal Body Weight:  56.8 kg  BMI:  Body mass index is 30.45 kg/m.  Estimated Nutritional Needs:   Kcal:  1800-2100kcal/day  Protein:  90-105g/day  Fluid:  >1.4L/day  Koleen Distance MS, RD, LDN Please refer to [facility] for RD and/or RD on-call/weekend/after hours pager

## 2019-12-04 NOTE — Consult Note (Signed)
[facility] Cardiology  CARDIOLOGY CONSULT NOTE  [name] MRN: [id number] DOB/AGE: March 08, 1946 74 y.o.  Admit date: 12/03/2019 Referring Physician [place] Primary Physician [facility] Primary Cardiologist [facility] Reason for Consultation congestive heart failure  HPI: The patient was admitted 12/03/2019 for severe shortness of breath, decreased responsiveness, and respiratory failure requiring emergency intubation.  Following intubation, patient reportedly experienced brief run of ventricular tachycardia with amiodarone bolus and infusion.  ECG revealed sinus rhythm with left bundle branch block.  Admission labs notable for BNP 777, and high-sensitivity troponin 11 and 32.  Chest x-ray revealed cardiomegaly, and pulmonary vascular congestions with small bilateral pleural effusions.  ABG revealed initial pH 7.10 with O2 saturation of 87%.  Lactic acid was 3.0.  Patient started on wide spectrum antibiotics.  The patient remains sedated and intubated.  Review of systems complete and found to be negative unless listed above     Past Medical History:  Diagnosis Date  . Acute pancreatitis 09/09/2018  . Acute respiratory failure with hypoxia and hypercarbia ([place]) 07/22/2015  . Cancer ([place])    uterine  . CHF (congestive heart failure) ([place])   . COPD (chronic obstructive pulmonary disease) ([place])   . Diabetes mellitus without complication ([place])   . Encephalopathy acute 07/22/2015  . Hypertension   . Pneumonia    November 2016  . Pulmonary edema 07/22/2015  . 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 ABDOMINAL HYSTERECTOMY    . CARPAL TUNNEL RELEASE Bilateral   . CESAREAN SECTION    . CHOLECYSTECTOMY N/A 09/12/2018   Procedure: LAPAROSCOPIC CHOLECYSTECTOMY WITH INTRAOPERATIVE CHOLANGIOGRAM;  Surgeon: Herbert Pun, MD;  Location: [facility] ORS;  Service: General;  Laterality: N/A;  . CORONARY ANGIOPLASTY WITH STENT PLACEMENT     . KNEE SURGERY Left   . LEFT HEART CATH AND CORONARY ANGIOGRAPHY Right 11/26/2016   Procedure: Left Heart Cath and Coronary Angiography;  Surgeon: Isaias Cowman, MD;  Location: [place] [facility];  Service: Cardiovascular;  Laterality: Right;    Medications Prior to Admission  Medication Sig Dispense Refill Last Dose  . albuterol (VENTOLIN HFA) 108 (90 Base) MCG/ACT inhaler Inhale 2 puffs into the lungs every 6 (six) hours as needed for wheezing or shortness of breath. 18 g 6   . aspirin EC 81 MG tablet Take 81 mg by mouth at bedtime.      . budesonide-formoterol (SYMBICORT) 160-4.5 MCG/ACT inhaler Inhale 2 puffs into the lungs 2 (two) times daily. 3 Inhaler 3   . carvedilol (COREG) 12.5 MG tablet TAKE 2 TABLETS BY MOUTH TWICE DAILY WITH A MEAL 360 tablet 1   . cyclobenzaprine (FLEXERIL) 10 MG tablet Take 1 tablet (10 mg total) by mouth 3 (three) times daily as needed for muscle spasms. DO NOT DRIVE ON THIS MEDICINE- WILL MAKE YOU SLEEPY 30 tablet 0   . Dulaglutide (TRULICITY) 1.5 0000000 SOPN Inject 1.5 mg into the skin once a week. 4 pen 6   . furosemide (LASIX) 40 MG tablet Take 1 tablet (40 mg total) by mouth daily. 90 tablet 1   . isosorbide mononitrate (IMDUR) 60 MG 24 hr tablet Take 1 tablet (60 mg total) by mouth daily. 90 tablet 1   . Multiple Vitamin (MULTIVITAMIN WITH MINERALS) TABS tablet Take 1 tablet by mouth daily.     . nitroGLYCERIN (NITROSTAT) 0.4 MG SL tablet Place 1 tablet (0.4 mg total) under the tongue every 5 (five)  minutes as needed for chest pain. (Patient not taking: Reported on 10/25/2019) 10 tablet 12   . nystatin (MYCOSTATIN) 100000 UNIT/ML suspension Take 5 mLs (500,000 Units total) by mouth 4 (four) times daily. 100 mL 0   . ondansetron (ZOFRAN-ODT) 4 MG disintegrating tablet Take 4 mg by mouth every 8 (eight) hours as needed for nausea or vomiting.     . rosuvastatin (CRESTOR) 20 MG tablet Take 1 tablet (20 mg total) by mouth daily. 90 tablet 1   .  sertraline (ZOLOFT) 100 MG tablet Take 2 tablets (200 mg total) by mouth daily. 180 tablet 1   . tiotropium (SPIRIVA HANDIHALER) 18 MCG inhalation capsule Place 1 capsule (18 mcg total) into inhaler and inhale daily. 90 capsule 3   . traZODone (DESYREL) 50 MG tablet TAKE 1/2 TO 1 (ONE-HALF TO ONE) TABLET BY MOUTH AT BEDTIME AS NEEDED FOR SLEEP 90 tablet 1   . triamcinolone ointment (KENALOG) 0.5 % Apply 1 application topically 2 (two) times daily. 30 g 3    Social History   Socioeconomic History  . Marital status: Divorced    Spouse name: Not on file  . Number of children: Not on file  . Years of education: Not on file  . Highest education level: Some college, no degree  Occupational History  . Occupation: retired  Tobacco Use  . Smoking status: Current Every Day Smoker    Packs/day: 0.50    Types: Cigarettes  . Smokeless tobacco: Never Used  Substance and Sexual Activity  . Alcohol use: No  . Drug use: No  . Sexual activity: Never  Other Topics Concern  . Not on file  Social History Narrative  . Not on file   Social Determinants of Health   Financial Resource Strain: High Risk  . Difficulty of Paying Living Expenses: Hard  Food Insecurity:   . Worried About Charity fundraiser in the Last Year:   . Arboriculturist in the Last Year:   Transportation Needs:   . Film/video editor (Medical):   Marland Kitchen Lack of Transportation (Non-Medical):   Physical Activity:   . Days of Exercise per Week:   . Minutes of Exercise per Session:   Stress: No Stress Concern Present  . Feeling of Stress : Only a little  Social Connections:   . Frequency of Communication with Friends and Family:   . Frequency of Social Gatherings with Friends and Family:   . Attends Religious Services:   . Active Member of Clubs or Organizations:   . Attends Archivist Meetings:   Marland Kitchen Marital Status:   Intimate Partner Violence:   . Fear of Current or Ex-Partner:   . Emotionally Abused:   Marland Kitchen  Physically Abused:   . Sexually Abused:     Family History  Problem Relation Age of Onset  . Heart disease Mother       Review of systems complete and found to be negative unless listed above      PHYSICAL EXAM  General: Well developed, well nourished, in no acute distress HEENT:  Normocephalic and atramatic Neck:  No JVD.  Lungs: Clear bilaterally to auscultation and percussion. Heart: HRRR . Normal S1 and S2 without gallops or murmurs.  Abdomen: Bowel sounds are positive, abdomen soft and non-tender  Msk:  Back normal, normal gait. Normal strength and tone for age. Extremities: No clubbing, cyanosis or edema.   Neuro: Alert and oriented X 3. Psych:  Good affect, responds appropriately  Labs:   Lab Results  Component Value Date   WBC 9.8 12/04/2019   HGB 13.0 12/04/2019   HCT 38.6 12/04/2019   MCV 84.3 12/04/2019   PLT 154 12/04/2019    Recent Labs  Lab 12/04/19 0407  NA 140  K 3.4*  CL 103  CO2 27  BUN 13  CREATININE 0.69  CALCIUM 8.4*  PROT 5.9*  BILITOT 0.8  ALKPHOS 109  ALT 103*  AST 104*  GLUCOSE 169*   Lab Results  Component Value Date   CKTOTAL 36 01/15/2014   CKMB 0.7 01/16/2014   TROPONINI <0.03 09/08/2018    Lab Results  Component Value Date   CHOL 154 08/22/2019   CHOL 166 02/15/2019   CHOL 177 03/24/2018   Lab Results  Component Value Date   HDL 34 (L) 08/22/2019   HDL 36 (L) 02/15/2019   HDL 34 (L) 03/24/2018   Lab Results  Component Value Date   LDLCALC 87 08/22/2019   LDLCALC 76 02/15/2019   LDLCALC 92 03/24/2018   Lab Results  Component Value Date   TRIG 163 (H) 12/03/2019   TRIG 288 (H) 12/03/2019   TRIG 195 (H) 08/22/2019   Lab Results  Component Value Date   CHOLHDL 4.3 11/26/2016   No results found for: LDLDIRECT    Radiology: CT Head Wo Contrast  Result Date: 12/03/2019 CLINICAL DATA:  Respiratory distress.  Became unresponsive. EXAM: CT HEAD WITHOUT CONTRAST TECHNIQUE: Contiguous axial images were  obtained from the base of the skull through the vertex without intravenous contrast. COMPARISON:  None. FINDINGS: Brain: No acute intracranial abnormality. Specifically, no hemorrhage, hydrocephalus, mass lesion, acute infarction, or significant intracranial injury. Vascular: No hyperdense vessel or unexpected calcification. Skull: No acute calvarial abnormality. Sinuses/Orbits: Visualized paranasal sinuses and mastoids clear. Orbital soft tissues unremarkable. Other: None IMPRESSION: No acute intracranial abnormality. Electronically Signed   By: Rolm Baptise M.D.   On: 12/03/2019 21:56   DG Chest Port 1 View  Result Date: 12/04/2019 CLINICAL DATA:  Acute respiratory failure EXAM: PORTABLE CHEST 1 VIEW COMPARISON:  Yesterday FINDINGS: Endotracheal tube tip is just below the clavicular heads. The enteric tube at least reaches the stomach. Normal heart size and mediastinal contours. Prominent interstitial markings at the bases. No visible effusion or pneumothorax. IMPRESSION: 1. Unremarkable hardware positioning. 2. Similar degree of interstitial coarsening favoring atelectasis or vascular congestion. Electronically Signed   By: Monte Fantasia M.D.   On: 12/04/2019 07:30   DG Chest Portable 1 View  Result Date: 12/03/2019 CLINICAL DATA:  Intubation EXAM: PORTABLE CHEST 1 VIEW COMPARISON:  05/15/2019 FINDINGS: The endotracheal tube terminates approximately 5.6 cm above the carina. The enteric tube extends below the left hemidiaphragm and terminates over the gastric body. There is persistent cardiomegaly. Prominent interstitial lung markings are noted. There is no pneumothorax. There may be trace to small bilateral pleural effusions. Aortic calcifications are noted. There is no acute osseous abnormality. IMPRESSION: 1. Endotracheal tube terminates approximately 5.6 cm above the carina. 2. Persistent cardiomegaly and pulmonary vascular congestion. 3. Possible trace to small bilateral pleural effusions.  Electronically Signed   By: Constance Holster M.D.   On: 12/03/2019 21:24    EKG: Sinus rhythm with left bundle branch block  ASSESSMENT AND PLAN:   1.  Respiratory failure, multifactorial, primarily due to underlying COPD, with element of acute systolic congestive heart failure, sedated and intubated 2.  Acute systolic congestive heart failure, previous echo 05/15/2019 revealed LVEF of 40 to 45%, BNP  777 3.  Ventricular tachycardia, in the setting of respiratory failure post intubation, no telemetry strips for review, currently on amiodarone drip, high-sensitivity troponin 11 and 32 4.  COPD exacerbation 5.  Possible pneumonia /sepsis, on broad-spectrum antibiotics  Recommendations  1.  Agree with current therapy 2.  Continue diuresis 3.  Carefully monitor renal status 4.  Defer full dose anticoagulation at this time 5.  Continue amiodarone drip for now 6.  Review 2D echocardiogram 7.  Further recommendations pending echocardiogram results  Signed: Isaias Cowman MD,PhD, [facility] 12/04/2019, 8:41 AM

## 2019-12-04 NOTE — Progress Notes (Signed)
CSW acknowledges consult to obtain Advance Directives/contact information. Per char review, patient has a brother and son listed for contacts. Patient is currently intubated so unable to complete Advance Directives. Please defer to son for wishes as he would be next of kin when patient is unable to make decisions.   Lovena Neighbours [phone]'

## 2019-12-04 NOTE — ED Notes (Signed)
vanc restarted through new IV placed by IV team

## 2019-12-04 NOTE — Progress Notes (Signed)
Pharmacy Electrolyte Monitoring Consult:  Pharmacy consulted to assist in monitoring and replacing electrolytes in this 74 y.o. female admitted on 12/03/2019 with Respiratory Distress   Labs:  Sodium (mmol/L)  Date Value  12/04/2019 140  08/22/2019 142  01/15/2014 134 (L)   Potassium (mmol/L)  Date Value  12/04/2019 3.4 (L)  01/15/2014 4.2   Magnesium (mg/dL)  Date Value  12/04/2019 1.9   Phosphorus (mg/dL)  Date Value  12/04/2019 2.3 (L)   Calcium (mg/dL)  Date Value  12/04/2019 8.4 (L)   Calcium, Total (mg/dL)  Date Value  01/15/2014 9.2   Albumin (g/dL)  Date Value  12/04/2019 3.4 (L)  08/22/2019 4.2  01/15/2014 3.7    Assessment/Plan: Furosemide 40mg  IV Daily. Potassium 62mEq PO x 2.   Electrolytes with am labs.   Pharmacy will continue to monitor and adjust per consult.   Collyn Selk L 12/04/2019 4:28 PM

## 2019-12-04 NOTE — Progress Notes (Signed)
Patient calls out with sudden onset of chest pain. Chest pain left sided, burning sensation, patient restless.  EKG, morphine and troponins ordered per Dr. Mortimer Fries.

## 2019-12-04 NOTE — ED Notes (Signed)
Pt began to wake up from sedation, pt was given bolus of versed and fentanyl as ordered. Admitting intensivist notified at this time

## 2019-12-04 NOTE — Progress Notes (Signed)
Pt extubated pr MD order, placed on 3Lnc for pt comfort

## 2019-12-05 ENCOUNTER — Other Ambulatory Visit: Payer: Self-pay

## 2019-12-05 DIAGNOSIS — D72829 Elevated white blood cell count, unspecified: Secondary | ICD-10-CM

## 2019-12-05 DIAGNOSIS — G9341 Metabolic encephalopathy: Secondary | ICD-10-CM

## 2019-12-05 LAB — LEGIONELLA PNEUMOPHILA SEROGP 1 UR AG: L. pneumophila Serogp 1 Ur Ag: NEGATIVE

## 2019-12-05 LAB — GLUCOSE, CAPILLARY
Glucose-Capillary: 100 mg/dL — ABNORMAL HIGH (ref 70–99)
Glucose-Capillary: 153 mg/dL — ABNORMAL HIGH (ref 70–99)
Glucose-Capillary: 104 mg/dL — ABNORMAL HIGH (ref 70–99)
Glucose-Capillary: 107 mg/dL — ABNORMAL HIGH (ref 70–99)

## 2019-12-05 LAB — BASIC METABOLIC PANEL
Anion gap: 9 (ref 5–15)
BUN: 16 mg/dL (ref 8–23)
CO2: 32 mmol/L (ref 22–32)
Calcium: 9 mg/dL (ref 8.9–10.3)
Chloride: 101 mmol/L (ref 98–111)
Creatinine, Ser: 0.76 mg/dL (ref 0.44–1.00)
GFR calc Af Amer: >60 mL/min (ref >60)
GFR calc non Af Amer: >60 mL/min (ref >60)
Glucose, Bld: 107 mg/dL — ABNORMAL HIGH (ref 70–99)
Potassium: 3.7 mmol/L (ref 3.5–5.1)
Sodium: 142 mmol/L (ref 135–145)

## 2019-12-05 LAB — PHOSPHORUS: Phosphorus: 3.7 mg/dL (ref 2.5–4.6)

## 2019-12-05 LAB — URINE CULTURE: Culture: <10000 — AB

## 2019-12-05 LAB — ECHOCARDIOGRAM COMPLETE
Height: 65 in
Weight: 2927.71 oz

## 2019-12-05 LAB — PROCALCITONIN: Procalcitonin: 0.15 ng/mL

## 2019-12-05 LAB — MAGNESIUM: Magnesium: 2.1 mg/dL (ref 1.7–2.4)

## 2019-12-05 MED ORDER — POTASSIUM CHLORIDE CRYS ER 20 MEQ PO TBCR
40.0000 meq | EXTENDED_RELEASE_TABLET | Freq: Once | ORAL | Status: AC
  Administered 2019-12-05: 40 meq via ORAL
  Filled 2019-12-05: qty 2

## 2019-12-05 MED ORDER — CHLORHEXIDINE GLUCONATE CLOTH 2 % EX PADS
6.0000 | MEDICATED_PAD | Freq: Every day | CUTANEOUS | Status: DC
  Administered 2019-12-05: 6 via TOPICAL

## 2019-12-05 NOTE — Progress Notes (Signed)
Report called to Cory Roughen, Therapist, sports. Pt transferred to room 227 without incident.

## 2019-12-05 NOTE — Progress Notes (Signed)
PROGRESS NOTE    Sarah White  [id number] DOB: 07-Mar-1946 DOA: 12/03/2019 PCP: Valerie Roys, DO    Brief Narrative:  Was admitted to the ICU.  Found with severe hypoxic hypercapnic respiratory failure.  Shortly after was intubated and she developed ventricular tachycardia.  Started on amiodarone drip.CT head is negative for any acute intracranial abnormality.  Lab work revealed BNP 578, high-sensitivity troponin 11, lactic acid 3, WBC 11.9 with lymphocytosis, procalcitonin 0.11.  Her SARS-CoV-2 PCR and influenza PCR both are negative.  She was noted to be slightly febrile with temperature 100.4.  She was given 1 L IV fluids, broad-spectrum antibiotics,  125 mg of IV Solu-Medrol, and 3 duo nebs in the ED. 4/4: Presented to ED, required intubation in ED 4/5: Admitted to ICU He was status post extubation and transferred to the hospitalist service today.    Consultants:   Cardiology, pulmonary  Procedures: Intubation status post extubation  Antimicrobials:  Cefepime x1 dose in ED 4/4 Flagyl x1 dose in ED 4/4 Vancomycin x1 dose in ED 4/4   Subjective: Feeling better.  Not as shortness of breath now.  No chest pain.  Objective: Vitals:   12/05/19 1200 12/05/19 1300 12/05/19 1346 12/05/19 1400  BP: (!) 145/69 (!) 134/116  (!) 120/95  Pulse: 80 82 84 84  Resp: (!) 23 (!) 22 (!) 27 (!) 22  Temp:   98 F (36.7 C)   TempSrc:   Oral   SpO2: 95% 91% 97% 96%  Weight:      Height:        Intake/Output Summary (Last 24 hours) at 12/05/2019 1526 Last data filed at 12/04/2019 1900 Gross per 24 hour  Intake 250 ml  Output --  Net 250 ml   Filed Weights   12/03/19 2108 12/03/19 2202 12/04/19 0500  Weight: 81.6 kg 81.2 kg 83 kg    Examination:  General exam: Appears calm and comfortable, sitting in recliner Respiratory system: Clear to auscultation. Respiratory effort normal.  No wheezing Cardiovascular system: S1 & S2 heard, RRR. No JVD, murmurs, rubs, gallops or clicks.   Gastrointestinal system: Abdomen is nondistended, soft and nontender. Normal bowel sounds heard. Central nervous system: Alert and appropriate.  Gostly intact Extremities: Mild bilateral edema Skin: Warm dry Psychiatry: Judgement and insight appear normal. Mood & affect appropriate.     Data Reviewed: I have personally reviewed following labs and imaging studies  CBC: Recent Labs  Lab 12/03/19 2110 12/04/19 0407  WBC 11.9* 9.8  NEUTROABS 6.1 9.1*  HGB 14.8 13.0  HCT 47.4* 38.6  MCV 87.5 84.3  PLT 219 [id number]   Basic Metabolic Panel: Recent Labs  Lab 12/03/19 2110 12/04/19 0407 12/05/19 0539  NA 139 140 142  K 4.1 3.4* 3.7  CL 101 103 101  CO2 23 27 32  GLUCOSE 203* 169* 107*  BUN 11 13 16   CREATININE 0.93 0.69 0.76  CALCIUM 9.1 8.4* 9.0  MG 2.3 1.9 2.1  PHOS  --  2.3* 3.7   GFR: Estimated Creatinine Clearance: 68.6 mL/min (by C-G formula based on SCr of 0.76 mg/dL). Liver Function Tests: Recent Labs  Lab 12/03/19 2110 12/04/19 0407  AST 67* 104*  ALT 49* 103*  ALKPHOS 124 109  BILITOT 0.9 0.8  PROT 7.8 5.9*  ALBUMIN 4.6 3.4*   No results for input(s): LIPASE, AMYLASE in the last 168 hours. No results for input(s): AMMONIA in the last 168 hours. Coagulation Profile: No results for input(s): INR, PROTIME in  the last 168 hours. Cardiac Enzymes: No results for input(s): CKTOTAL, CKMB, CKMBINDEX, TROPONINI in the last 168 hours. BNP (last 3 results) No results for input(s): PROBNP in the last 8760 hours. HbA1C: Recent Labs    12/04/19 0231  HGBA1C 6.1*   CBG: Recent Labs  Lab 12/04/19 1110 12/04/19 1700 12/04/19 2108 12/05/19 0730 12/05/19 1121  GLUCAP 134* 115* 106* 100* 153*   Lipid Profile: Recent Labs    12/03/19 2110 12/03/19 2320  TRIG 288* 163*   Thyroid Function Tests: No results for input(s): TSH, T4TOTAL, FREET4, T3FREE, THYROIDAB in the last 72 hours. Anemia Panel: No results for input(s): VITAMINB12, FOLATE, FERRITIN, TIBC,  IRON, RETICCTPCT in the last 72 hours. Sepsis Labs: Recent Labs  Lab 12/03/19 2110 12/03/19 2320 12/04/19 0407 12/05/19 0539  PROCALCITON  --  0.11 <0.10 0.15  LATICACIDVEN 3.0* 1.0  --   --     Recent Results (from the past 240 hour(s))  Respiratory Panel by RT PCR (Flu A&B, Covid) - Nasopharyngeal Swab     Status: None   Collection Time: 12/03/19  9:10 PM   Specimen: Nasopharyngeal Swab  Result Value Ref Range Status   SARS Coronavirus 2 by RT PCR NEGATIVE NEGATIVE Final    Comment: (NOTE) SARS-CoV-2 target nucleic acids are NOT DETECTED. The SARS-CoV-2 RNA is generally detectable in upper respiratoy specimens during the acute phase of infection. The lowest concentration of SARS-CoV-2 viral copies this assay can detect is 131 copies/mL. A negative result does not preclude SARS-Cov-2 infection and should not be used as the sole basis for treatment or other patient management decisions. A negative result may occur with  improper specimen collection/handling, submission of specimen other than nasopharyngeal swab, presence of viral mutation(s) within the areas targeted by this assay, and inadequate number of viral copies (<131 copies/mL). A negative result must be combined with clinical observations, patient history, and epidemiological information. The expected result is Negative. Fact Sheet for Patients:  PinkCheek.be Fact Sheet for Healthcare Providers:  GravelBags.it This test is not yet ap proved or cleared by the [place] FDA and  has been authorized for detection and/or diagnosis of SARS-CoV-2 by FDA under an Emergency Use Authorization (EUA). This EUA will remain  in effect (meaning this test can be used) for the duration of the COVID-19 declaration under Section 564(b)(1) of the Act, 21 U.S.C. section 360bbb-3(b)(1), unless the authorization is terminated or revoked sooner.    Influenza A by PCR NEGATIVE  NEGATIVE Final   Influenza B by PCR NEGATIVE NEGATIVE Final    Comment: (NOTE) The Xpert Xpress SARS-CoV-2/FLU/RSV assay is intended as an aid in  the diagnosis of influenza from Nasopharyngeal swab specimens and  should not be used as a sole basis for treatment. Nasal washings and  aspirates are unacceptable for Xpert Xpress SARS-CoV-2/FLU/RSV  testing. Fact Sheet for Patients: PinkCheek.be Fact Sheet for Healthcare Providers: GravelBags.it This test is not yet approved or cleared by the [place] FDA and  has been authorized for detection and/or diagnosis of SARS-CoV-2 by  FDA under an Emergency Use Authorization (EUA). This EUA will remain  in effect (meaning this test can be used) for the duration of the  Covid-19 declaration under Section 564(b)(1) of the Act, 21  U.S.C. section 360bbb-3(b)(1), unless the authorization is  terminated or revoked. Performed at [facility], [address], [place], [place] [address]   Urine culture     Status: Abnormal   Collection Time: 12/03/19  9:10  PM   Specimen: Urine, Random  Result Value Ref Range Status   Specimen Description   Final    URINE, RANDOM Performed at [facility], [address], [place], [place] [address]    Special Requests   Final    NONE Performed at [facility], [address] [place], [place] [address]    Culture (A)  Final    <10,000 COLONIES/mL INSIGNIFICANT GROWTH Performed at [facility] [address] [place], [place] [address]    Report Status 12/05/2019 FINAL  Final  Blood culture (routine x 2)     Status: None (Preliminary result)   Collection Time: 12/03/19 10:29 PM   Specimen: BLOOD  Result Value Ref Range Status   Specimen Description BLOOD BLOOD LEFT HAND  Final   Special Requests   Final    BOTTLES DRAWN AEROBIC AND ANAEROBIC Blood Culture adequate volume   Culture   Final    NO GROWTH 2  DAYS Performed at [facility], [address], [place], [place] [address]    Report Status PENDING  Incomplete  Blood culture (routine x 2)     Status: None (Preliminary result)   Collection Time: 12/03/19 10:29 PM   Specimen: BLOOD  Result Value Ref Range Status   Specimen Description BLOOD BLOOD RIGHT FOREARM  Final   Special Requests   Final    BOTTLES DRAWN AEROBIC AND ANAEROBIC Blood Culture adequate volume   Culture   Final    NO GROWTH 2 DAYS Performed at [facility], [address], [place], [place] [address]    Report Status PENDING  Incomplete  MRSA PCR Screening     Status: None   Collection Time: 12/04/19  3:22 AM   Specimen: Nasopharyngeal  Result Value Ref Range Status   MRSA by PCR NEGATIVE NEGATIVE Final    Comment:        The GeneXpert MRSA Assay (FDA approved for NASAL specimens only), is one component of a comprehensive MRSA colonization surveillance program. It is not intended to diagnose MRSA infection nor to guide or monitor treatment for MRSA infections. Performed at [facility], [address], [place], [place] [address]          Radiology Studies: CT Head Wo Contrast  Result Date: 12/03/2019 CLINICAL DATA:  Respiratory distress.  Became unresponsive. EXAM: CT HEAD WITHOUT CONTRAST TECHNIQUE: Contiguous axial images were obtained from the base of the skull through the vertex without intravenous contrast. COMPARISON:  None. FINDINGS: Brain: No acute intracranial abnormality. Specifically, no hemorrhage, hydrocephalus, mass lesion, acute infarction, or significant intracranial injury. Vascular: No hyperdense vessel or unexpected calcification. Skull: No acute calvarial abnormality. Sinuses/Orbits: Visualized paranasal sinuses and mastoids clear. Orbital soft tissues unremarkable. Other: None IMPRESSION: No acute intracranial abnormality. Electronically Signed   By: Rolm Baptise M.D.   On: 12/03/2019 21:56   DG Chest  Port 1 View  Result Date: 12/04/2019 CLINICAL DATA:  Acute respiratory failure EXAM: PORTABLE CHEST 1 VIEW COMPARISON:  Yesterday FINDINGS: Endotracheal tube tip is just below the clavicular heads. The enteric tube at least reaches the stomach. Normal heart size and mediastinal contours. Prominent interstitial markings at the bases. No visible effusion or pneumothorax. IMPRESSION: 1. Unremarkable hardware positioning. 2. Similar degree of interstitial coarsening favoring atelectasis or vascular congestion. Electronically Signed   By: Monte Fantasia M.D.   On: 12/04/2019 07:30   DG Chest Portable 1 View  Result Date: 12/03/2019 CLINICAL DATA:  Intubation  EXAM: PORTABLE CHEST 1 VIEW COMPARISON:  05/15/2019 FINDINGS: The endotracheal tube terminates approximately 5.6 cm above the carina. The enteric tube extends below the left hemidiaphragm and terminates over the gastric body. There is persistent cardiomegaly. Prominent interstitial lung markings are noted. There is no pneumothorax. There may be trace to small bilateral pleural effusions. Aortic calcifications are noted. There is no acute osseous abnormality. IMPRESSION: 1. Endotracheal tube terminates approximately 5.6 cm above the carina. 2. Persistent cardiomegaly and pulmonary vascular congestion. 3. Possible trace to small bilateral pleural effusions. Electronically Signed   By: Constance Holster M.D.   On: 12/03/2019 21:24   ECHOCARDIOGRAM COMPLETE  Result Date: 12/05/2019    ECHOCARDIOGRAM REPORT   Patient Name:   SHALISE ASPLIN Date of Exam: 12/04/2019 Medical Rec #:  [id number]      Height:       65.0 in Accession #:    [id number]     Weight:       183.0 lb Date of Birth:  Feb 12, 1946      BSA:          1.905 m Patient Age:    38 years       BP:           149/72 mmHg Patient Gender: F              HR:           95 bpm. Exam Location:  [facility] Procedure: 2D Echo, Cardiac Doppler and Color Doppler Indications:     CHF 428.21  History:         Patient has  prior history of Echocardiogram examinations. CHF,                  COPD; Risk Factors:Hypertension.  Sonographer:     Alyse Low Roar Referring Phys:  [id number] Bradly Bienenstock Diagnosing Phys: Isaias Cowman MD IMPRESSIONS  1. Left ventricular ejection fraction, by estimation, is 30 to 35%. The left ventricle has moderately decreased function. The left ventricle has no regional wall motion abnormalities. There is mild left ventricular hypertrophy. Left ventricular diastolic parameters were normal.  2. Right ventricular systolic function is normal. The right ventricular size is normal.  3. The mitral valve is normal in structure. No evidence of mitral valve regurgitation. Moderate to severe mitral stenosis.  4. Tricuspid valve regurgitation is mild to moderate.  5. The aortic valve is normal in structure. Aortic valve regurgitation is not visualized. No aortic stenosis is present.  6. The inferior vena cava is normal in size with greater than 50% respiratory variability, suggesting right atrial pressure of 3 mmHg. FINDINGS  Left Ventricle: Left ventricular ejection fraction, by estimation, is 30 to 35%. The left ventricle has moderately decreased function. The left ventricle has no regional wall motion abnormalities. The left ventricular internal cavity size was normal in size. There is mild left ventricular hypertrophy. Left ventricular diastolic parameters were normal. Right Ventricle: The right ventricular size is normal. No increase in right ventricular wall thickness. Right ventricular systolic function is normal. Left Atrium: Left atrial size was normal in size. Right Atrium: Right atrial size was normal in size. Pericardium: There is no evidence of pericardial effusion. Mitral Valve: The mitral valve is normal in structure. Normal mobility of the mitral valve leaflets. No evidence of mitral valve regurgitation. Moderate to severe mitral valve stenosis. Tricuspid Valve: The tricuspid valve is normal in  structure. Tricuspid valve regurgitation is mild to moderate. No evidence of tricuspid  stenosis. Aortic Valve: The aortic valve is normal in structure. Aortic valve regurgitation is not visualized. No aortic stenosis is present. Aortic valve mean gradient measures 2.0 mmHg. Aortic valve peak gradient measures 4.2 mmHg. Aortic valve area, by VTI measures 1.98 cm. Pulmonic Valve: The pulmonic valve was normal in structure. Pulmonic valve regurgitation is not visualized. No evidence of pulmonic stenosis. Aorta: The aortic root is normal in size and structure. Venous: The inferior vena cava is normal in size with greater than 50% respiratory variability, suggesting right atrial pressure of 3 mmHg. IAS/Shunts: No atrial level shunt detected by color flow Doppler.  LEFT VENTRICLE PLAX 2D LVIDd:         4.69 cm  Diastology LVIDs:         4.04 cm  LV e' lateral:   12.40 cm/s LV PW:         1.22 cm  LV E/e' lateral: 13.7 LV IVS:        1.41 cm  LV e' medial:    6.96 cm/s LVOT diam:     1.80 cm  LV E/e' medial:  24.4 LV SV:         35 LV SV Index:   19 LVOT Area:     2.54 cm  RIGHT VENTRICLE RV Mid diam:    2.52 cm RV S prime:     10.30 cm/s TAPSE (M-mode): 1.9 cm LEFT ATRIUM             Index       RIGHT ATRIUM           Index LA diam:        4.60 cm 2.41 cm/m  RA Area:     12.00 cm LA Vol (A2C):   86.4 ml 45.36 ml/m RA Volume:   26.70 ml  14.02 ml/m LA Vol (A4C):   88.0 ml 46.20 ml/m LA Biplane Vol: 89.8 ml 47.14 ml/m  AORTIC VALVE                   PULMONIC VALVE AV Area (Vmax):    2.03 cm    PV Vmax:        0.86 m/s AV Area (Vmean):   1.96 cm    PV Peak grad:   2.9 mmHg AV Area (VTI):     1.98 cm    RVOT Peak grad: 2 mmHg AV Vmax:           103.00 cm/s AV Vmean:          72.100 cm/s AV VTI:            0.179 m AV Peak Grad:      4.2 mmHg AV Mean Grad:      2.0 mmHg LVOT Vmax:         82.30 cm/s LVOT Vmean:        55.400 cm/s LVOT VTI:          0.139 m LVOT/AV VTI ratio: 0.78  AORTA Ao Root diam: 2.40 cm MITRAL  VALVE MV Area (PHT): 4.29 cm     SHUNTS MV Decel Time: 177 msec     Systemic VTI:  0.14 m MV E velocity: 170.00 cm/s  Systemic Diam: 1.80 cm Isaias Cowman MD Electronically signed by Isaias Cowman MD Signature Date/Time: 12/05/2019/9:22:18 AM    Final         Scheduled Meds:  amiodarone  200 mg Oral Q12H   Followed by   Derrill Memo ON 12/11/2019]  amiodarone  200 mg Oral Daily   aspirin EC  81 mg Oral Daily   carvedilol  12.5 mg Oral BID WC   Chlorhexidine Gluconate Cloth  6 each Topical Daily   enoxaparin (LOVENOX) injection  40 mg Subcutaneous Q24H   famotidine  20 mg Oral BID   furosemide  40 mg Intravenous Daily   insulin aspart  0-15 Units Subcutaneous TID WC   insulin aspart  0-5 Units Subcutaneous QHS   insulin detemir  8 Units Subcutaneous Daily   mometasone-formoterol  2 puff Inhalation BID   predniSONE  20 mg Oral Q breakfast   Ensure Max Protein  11 oz Oral BID   rosuvastatin  20 mg Oral q1800   sertraline  100 mg Oral Daily   tiotropium  18 mcg Inhalation Daily   Continuous Infusions:  Assessment & Plan:   Active Problems:   Acute respiratory failure with hypoxia and hypercarbia (HCC)   Acute on chronic hypoxic hypercapnic respiratory failure secondary to acute decompensatedHFrEF &acute COPD exacerbation -s/p extubation 4/5 Cardiology following -Bronchodilators On Dulera, prednisone Continue IV Lasix -Covid 19 PCR is negative [place] panel negative  Acute decompensatedHFrEF (EF 40-45% in September 2020) Ventricular tachycardia Continue iv lasix Cardiology following -Continue amiodarone infusion Cards- Defer full dose anticoagulation at this time Echo ef 30-35%, awaiting cards input  Leukocytosis with lymphocytosis Wbc improved.  -Chest x-ray without infiltrate4/4 -Urinalysis negative for UTI -Follow cultures as above -COVID-19 PCR and influenza PCR both negative -Check respiratory viral panel -Received cefepime,  Flagyl, vancomycin ED  Acute metabolic encephalopathy secondary to severe hypercapnia -CT head negative on 4/4 imrpoved today. Continue to monitor  DVT prophylaxis: lovenox Code Status: full Family Communication: none at bedside Disposition Plan: back hom. PT/OT Barrier: still requiring iv lasix for chf. F/u cards input on new echo findings.        LOS: 2 days   Time spent: 45 minutes with more than 50% COC    Nolberto Hanlon, MD [facility] Pager [phone]  If 7PM-7AM, please contact night-coverage [email or web address] Password [facility] 12/05/2019, 3:26 PM

## 2019-12-05 NOTE — Progress Notes (Signed)
[facility] Cardiology  SUBJECTIVE: Patient extubated, reports feeling better, denies chest pain, on O2 by nasal cannula   Vitals:   12/05/19 0000 12/05/19 0100 12/05/19 0200 12/05/19 0300  BP: (!) 145/69 (!) 155/68 (!) 156/63 (!) 128/113  Pulse: 86 86 83 86  Resp: 14 17 16  (!) 21  Temp: 98 F (36.7 C)     TempSrc: Oral     SpO2: 97% 98% 94% 93%  Weight:      Height:         Intake/Output Summary (Last 24 hours) at 12/05/2019 0745 Last data filed at 12/04/2019 1900 Gross per 24 hour  Intake 558.06 ml  Output 1300 ml  Net -741.94 ml      PHYSICAL EXAM  General: Well developed, well nourished, in no acute distress HEENT:  Normocephalic and atramatic Neck:  No JVD.  Lungs: Clear bilaterally to auscultation and percussion. Heart: HRRR . Normal S1 and S2 without gallops or murmurs.  Abdomen: Bowel sounds are positive, abdomen soft and non-tender  Msk:  Back normal, normal gait. Normal strength and tone for age. Extremities: No clubbing, cyanosis or edema.   Neuro: Alert and oriented X 3. Psych:  Good affect, responds appropriately   LABS: Basic Metabolic Panel: Recent Labs    12/04/19 0407 12/05/19 0539  NA 140 142  K 3.4* 3.7  CL 103 101  CO2 27 32  GLUCOSE 169* 107*  BUN 13 16  CREATININE 0.69 0.76  CALCIUM 8.4* 9.0  MG 1.9 2.1  PHOS 2.3* 3.7   Liver Function Tests: Recent Labs    12/03/19 2110 12/04/19 0407  AST 67* 104*  ALT 49* 103*  ALKPHOS 124 109  BILITOT 0.9 0.8  PROT 7.8 5.9*  ALBUMIN 4.6 3.4*   No results for input(s): LIPASE, AMYLASE in the last 72 hours. CBC: Recent Labs    12/03/19 2110 12/04/19 0407  WBC 11.9* 9.8  NEUTROABS 6.1 9.1*  HGB 14.8 13.0  HCT 47.4* 38.6  MCV 87.5 84.3  PLT 219 154   Cardiac Enzymes: No results for input(s): CKTOTAL, CKMB, CKMBINDEX, TROPONINI in the last 72 hours. BNP: Invalid input(s): POCBNP D-Dimer: No results for input(s): DDIMER in the last 72 hours. Hemoglobin A1C: Recent Labs    12/04/19 0231   HGBA1C 6.1*   Fasting Lipid Panel: Recent Labs    12/03/19 2320  TRIG 163*   Thyroid Function Tests: No results for input(s): TSH, T4TOTAL, T3FREE, THYROIDAB in the last 72 hours.  Invalid input(s): FREET3 Anemia Panel: No results for input(s): VITAMINB12, FOLATE, FERRITIN, TIBC, IRON, RETICCTPCT in the last 72 hours.  CT Head Wo Contrast  Result Date: 12/03/2019 CLINICAL DATA:  Respiratory distress.  Became unresponsive. EXAM: CT HEAD WITHOUT CONTRAST TECHNIQUE: Contiguous axial images were obtained from the base of the skull through the vertex without intravenous contrast. COMPARISON:  None. FINDINGS: Brain: No acute intracranial abnormality. Specifically, no hemorrhage, hydrocephalus, mass lesion, acute infarction, or significant intracranial injury. Vascular: No hyperdense vessel or unexpected calcification. Skull: No acute calvarial abnormality. Sinuses/Orbits: Visualized paranasal sinuses and mastoids clear. Orbital soft tissues unremarkable. Other: None IMPRESSION: No acute intracranial abnormality. Electronically Signed   By: Rolm Baptise M.D.   On: 12/03/2019 21:56   DG Chest Port 1 View  Result Date: 12/04/2019 CLINICAL DATA:  Acute respiratory failure EXAM: PORTABLE CHEST 1 VIEW COMPARISON:  Yesterday FINDINGS: Endotracheal tube tip is just below the clavicular heads. The enteric tube at least reaches the stomach. Normal heart size and mediastinal  contours. Prominent interstitial markings at the bases. No visible effusion or pneumothorax. IMPRESSION: 1. Unremarkable hardware positioning. 2. Similar degree of interstitial coarsening favoring atelectasis or vascular congestion. Electronically Signed   By: Monte Fantasia M.D.   On: 12/04/2019 07:30   DG Chest Portable 1 View  Result Date: 12/03/2019 CLINICAL DATA:  Intubation EXAM: PORTABLE CHEST 1 VIEW COMPARISON:  05/15/2019 FINDINGS: The endotracheal tube terminates approximately 5.6 cm above the carina. The enteric tube extends  below the left hemidiaphragm and terminates over the gastric body. There is persistent cardiomegaly. Prominent interstitial lung markings are noted. There is no pneumothorax. There may be trace to small bilateral pleural effusions. Aortic calcifications are noted. There is no acute osseous abnormality. IMPRESSION: 1. Endotracheal tube terminates approximately 5.6 cm above the carina. 2. Persistent cardiomegaly and pulmonary vascular congestion. 3. Possible trace to small bilateral pleural effusions. Electronically Signed   By: Constance Holster M.D.   On: 12/03/2019 21:24     Echo pending  TELEMETRY: Sinus rhythm:  ASSESSMENT AND PLAN:  Active Problems:   Acute respiratory failure with hypoxia and hypercarbia (HCC)    1.  Respiratory failure, multifactorial, likely primarily due to underlying COPD and ongoing tobacco abuse, with acute systolic congestive heart failure, now extubated on O2 by nasal cannula 2.  Acute systolic congestive heart failure, previous echocardiogram 05/15/2019 revealed LVEF 40 to 45%, BNP 777 3.  Coronary artery disease, known two-vessel CAD, with a 100% stenosis proximal left circumflex, 100% stenosis distal RCA by cardiac catheterization 11/26/2016, currently without chest pain, high sensitive troponin borderline elevated, 11 and 32, ECG nondiagnostic 4.  Ventricular tachycardia, in the setting of respiratory failure post intubation, no ECG or telemetry strips for review, was on amiodarone drip, now converted to p.o. amiodarone, without recurrence 5.  COPD exacerbation, ongoing tobacco abuse  Recommendations  1.  Agree with current therapy 2.  Continue diuresis 3.  Carefully monitor renal status 4.  Defer full dose anticoagulation 5.  Continue amiodarone p.o. for now 6.  Review 2D echocardiogram 7.  Further recommendations pending echocardiogram results 8.  May transfer to telemetry   Isaias Cowman, MD, PhD, [facility] 12/05/2019 7:45 AM

## 2019-12-05 NOTE — Progress Notes (Signed)
Pharmacy Electrolyte Monitoring Consult:  Pharmacy consulted to assist in monitoring and replacing electrolytes in this 74 y.o. female admitted on 12/03/2019 with Respiratory Distress   Labs:  Sodium (mmol/L)  Date Value  12/05/2019 142  08/22/2019 142  01/15/2014 134 (L)   Potassium (mmol/L)  Date Value  12/05/2019 3.7  01/15/2014 4.2   Magnesium (mg/dL)  Date Value  12/05/2019 2.1   Phosphorus (mg/dL)  Date Value  12/05/2019 3.7   Calcium (mg/dL)  Date Value  12/05/2019 9.0   Calcium, Total (mg/dL)  Date Value  01/15/2014 9.2   Albumin (g/dL)  Date Value  12/04/2019 3.4 (L)  08/22/2019 4.2  01/15/2014 3.7    Assessment/Plan: Furosemide 40mg  IV Daily. Potassium 26mEq PO x 1.   Electrolytes with am labs.   Pharmacy will continue to monitor and adjust per consult.   Voncile Schwarz L 12/05/2019 5:31 PM

## 2019-12-06 ENCOUNTER — Telehealth: Payer: Self-pay | Admitting: Family Medicine

## 2019-12-06 DIAGNOSIS — I472 Ventricular tachycardia: Secondary | ICD-10-CM

## 2019-12-06 DIAGNOSIS — J441 Chronic obstructive pulmonary disease with (acute) exacerbation: Secondary | ICD-10-CM

## 2019-12-06 LAB — GLUCOSE, CAPILLARY
Glucose-Capillary: 92 mg/dL (ref 70–99)
Glucose-Capillary: 135 mg/dL — ABNORMAL HIGH (ref 70–99)
Glucose-Capillary: 115 mg/dL — ABNORMAL HIGH (ref 70–99)
Glucose-Capillary: 108 mg/dL — ABNORMAL HIGH (ref 70–99)

## 2019-12-06 LAB — CBC
HCT: 36.5 % (ref 36.0–46.0)
Hemoglobin: 11.3 g/dL — ABNORMAL LOW (ref 12.0–15.0)
MCH: 27 pg (ref 26.0–34.0)
MCHC: 31 g/dL (ref 30.0–36.0)
MCV: 87.1 fL (ref 80.0–100.0)
Platelets: 118 10*3/uL — ABNORMAL LOW (ref 150–400)
RBC: 4.19 MIL/uL (ref 3.87–5.11)
RDW: 15.2 % (ref 11.5–15.5)
WBC: 8.8 10*3/uL (ref 4.0–10.5)
nRBC: 0 % (ref 0.0–0.2)

## 2019-12-06 LAB — BASIC METABOLIC PANEL
Anion gap: 9 (ref 5–15)
BUN: 21 mg/dL (ref 8–23)
CO2: 32 mmol/L (ref 22–32)
Calcium: 9.2 mg/dL (ref 8.9–10.3)
Chloride: 100 mmol/L (ref 98–111)
Creatinine, Ser: 0.69 mg/dL (ref 0.44–1.00)
GFR calc Af Amer: >60 mL/min (ref >60)
GFR calc non Af Amer: >60 mL/min (ref >60)
Glucose, Bld: 90 mg/dL (ref 70–99)
Potassium: 4.1 mmol/L (ref 3.5–5.1)
Sodium: 141 mmol/L (ref 135–145)

## 2019-12-06 MED ORDER — BISACODYL 5 MG PO TBEC
5.0000 mg | DELAYED_RELEASE_TABLET | Freq: Every day | ORAL | Status: DC | PRN
  Administered 2019-12-06: 5 mg via ORAL
  Filled 2019-12-06: qty 1

## 2019-12-06 MED ORDER — DOCUSATE SODIUM 100 MG PO CAPS
200.0000 mg | ORAL_CAPSULE | Freq: Two times a day (BID) | ORAL | Status: DC
  Administered 2019-12-06 – 2019-12-07 (×3): 200 mg via ORAL
  Filled 2019-12-06 (×3): qty 2

## 2019-12-06 MED ORDER — LOSARTAN POTASSIUM 25 MG PO TABS
25.0000 mg | ORAL_TABLET | Freq: Every day | ORAL | Status: DC
  Administered 2019-12-06 – 2019-12-07 (×2): 25 mg via ORAL
  Filled 2019-12-06 (×2): qty 1

## 2019-12-06 NOTE — Telephone Encounter (Signed)
Sharee Pimple was working with this pt.   Copied from [place] [phone]. Topic: General - Other >> Dec 05, 2019 12:06 PM Rainey Pines A wrote: Paperwork was sent over and sent to the wrong program. The medication that is being requested in for a different program for Trulicity. Novo Nordisk needs a different application that what was sent in order to enter program. Best contact [phone].

## 2019-12-06 NOTE — Progress Notes (Signed)
Pharmacy Electrolyte Monitoring Consult:  Pharmacy consulted to assist in monitoring and replacing electrolytes in this 74 y.o. female admitted on 12/03/2019 with Respiratory Distress   Labs:  Sodium (mmol/L)  Date Value  12/06/2019 141  08/22/2019 142  01/15/2014 134 (L)   Potassium (mmol/L)  Date Value  12/06/2019 4.1  01/15/2014 4.2   Magnesium (mg/dL)  Date Value  12/05/2019 2.1   Phosphorus (mg/dL)  Date Value  12/05/2019 3.7   Calcium (mg/dL)  Date Value  12/06/2019 9.2   Calcium, Total (mg/dL)  Date Value  01/15/2014 9.2   Albumin (g/dL)  Date Value  12/04/2019 3.4 (L)  08/22/2019 4.2  01/15/2014 3.7    Assessment/Plan: Electrolytes WNL. Will defer monitoring to MD.    Tawnya Crook, PharmD 12/06/2019 3:03 PM

## 2019-12-06 NOTE — Telephone Encounter (Signed)
She is not on any Novo Nordisk medications, so I am unsure why something was sent to them.   Patient previously approved for Trulicity. I don't think any action is required on this, but will route to CPhT just in case.

## 2019-12-06 NOTE — Care Management Important Message (Signed)
Important Message  Patient Details  Name: Sarah White MRN: [id number] Date of Birth: 1946-02-20   Medicare Important Message Given:  Yes     Dannette Barbara 12/06/2019, 11:07 AM

## 2019-12-06 NOTE — Progress Notes (Signed)
PROGRESS NOTE    Sarah White  [id number] DOB: 1945/12/16 DOA: 12/03/2019 PCP: Valerie Roys, DO      Assessment & Plan:   Active Problems:   Acute respiratory failure with hypoxia and hypercarbia (HCC)   Acute on chronic hypoxic hypercapnic respiratory failure: secondary to acute decompensatedsystolic CHF &acute COPD exacerbation. S/p extubation 12/04/19.Continue on bronchodilators and prednisone. Continue IV Lasix. Monitor I/Os. Covid 19 PCR is negative. Cardio following and recs apprec  Acute decompensated systolic CHF exacerbation: EF 40-45% in September 2020 & EF 30-35% currently. Continue on IV lasix. Monitor I/Os. Cardio following and recs apprec   Ventricular tachycardia: continue amiodarone 200mg  BID & will reduce to 200mg  daily as an outpatient w/ cardio. Defer full dose anticoagulation at this time as per cardio.   Leukocytosis: resolved. COVID-19 PCR and influenza PCR both negative Received cefepime, Flagyl, vancomycin ED  Acute metabolic encephalopathy: secondary to severe hypercapnia. CT head negative on 12/03/19.  Likely close to baseline    DVT prophylaxis: lovenox  Code Status: full  Family Communication:  Disposition Plan: depends on PT/OT recs   Consultants:   Cardio   ICU   Procedures:   Antimicrobials:    Subjective: Pt c/o shortness of breath but improved from day prior.  Objective: Vitals:   12/06/19 0750 12/06/19 0754 12/06/19 0908 12/06/19 1130  BP:   (!) 143/63 132/62  Pulse:   78 74  Resp:    16  Temp:    98.6 F (37 C)  TempSrc:    Oral  SpO2: (!) 89% 92%  95%  Weight:      Height:        Intake/Output Summary (Last 24 hours) at 12/06/2019 1507 Last data filed at 12/06/2019 1454 Gross per 24 hour  Intake 120 ml  Output 2400 ml  Net -2280 ml   Filed Weights   12/03/19 2202 12/04/19 0500 12/06/19 0500  Weight: 81.2 kg 83 kg 80.7 kg    Examination:  General exam: Appears calm and comfortable  Respiratory  system: decreased breath sounds b/l. No rales  Cardiovascular system: S1 & S2 +. No rubs, gallops or clicks.  Gastrointestinal system: Abdomen is nondistended, soft and nontender. Normal bowel sounds heard. Central nervous system: Alert and oriented. Moves all 4 extremities  Psychiatry: Judgement and insight appear normal. Mood & affect appropriate.     Data Reviewed: I have personally reviewed following labs and imaging studies  CBC: Recent Labs  Lab 12/03/19 2110 12/04/19 0407 12/06/19 0421  WBC 11.9* 9.8 8.8  NEUTROABS 6.1 9.1*  --   HGB 14.8 13.0 11.3*  HCT 47.4* 38.6 36.5  MCV 87.5 84.3 87.1  PLT 219 154 123456*   Basic Metabolic Panel: Recent Labs  Lab 12/03/19 2110 12/04/19 0407 12/05/19 0539 12/06/19 0421  NA 139 140 142 141  K 4.1 3.4* 3.7 4.1  CL 101 103 101 100  CO2 23 27 32 32  GLUCOSE 203* 169* 107* 90  BUN 11 13 16 21   CREATININE 0.93 0.69 0.76 0.69  CALCIUM 9.1 8.4* 9.0 9.2  MG 2.3 1.9 2.1  --   PHOS  --  2.3* 3.7  --    GFR: Estimated Creatinine Clearance: 67.7 mL/min (by C-G formula based on SCr of 0.69 mg/dL). Liver Function Tests: Recent Labs  Lab 12/03/19 2110 12/04/19 0407  AST 67* 104*  ALT 49* 103*  ALKPHOS 124 109  BILITOT 0.9 0.8  PROT 7.8 5.9*  ALBUMIN 4.6 3.4*  No results for input(s): LIPASE, AMYLASE in the last 168 hours. No results for input(s): AMMONIA in the last 168 hours. Coagulation Profile: No results for input(s): INR, PROTIME in the last 168 hours. Cardiac Enzymes: No results for input(s): CKTOTAL, CKMB, CKMBINDEX, TROPONINI in the last 168 hours. BNP (last 3 results) No results for input(s): PROBNP in the last 8760 hours. HbA1C: Recent Labs    12/04/19 0231  HGBA1C 6.1*   CBG: Recent Labs  Lab 12/05/19 1121 12/05/19 1624 12/05/19 2141 12/06/19 0738 12/06/19 1131  GLUCAP 153* 104* 107* 92 135*   Lipid Profile: Recent Labs    12/03/19 2110 12/03/19 2320  TRIG 288* 163*   Thyroid Function Tests:  No results for input(s): TSH, T4TOTAL, FREET4, T3FREE, THYROIDAB in the last 72 hours. Anemia Panel: No results for input(s): VITAMINB12, FOLATE, FERRITIN, TIBC, IRON, RETICCTPCT in the last 72 hours. Sepsis Labs: Recent Labs  Lab 12/03/19 2110 12/03/19 2320 12/04/19 0407 12/05/19 0539  PROCALCITON  --  0.11 <0.10 0.15  LATICACIDVEN 3.0* 1.0  --   --     Recent Results (from the past 240 hour(s))  Respiratory Panel by RT PCR (Flu A&B, Covid) - Nasopharyngeal Swab     Status: None   Collection Time: 12/03/19  9:10 PM   Specimen: Nasopharyngeal Swab  Result Value Ref Range Status   SARS Coronavirus 2 by RT PCR NEGATIVE NEGATIVE Final    Comment: (NOTE) SARS-CoV-2 target nucleic acids are NOT DETECTED. The SARS-CoV-2 RNA is generally detectable in upper respiratoy specimens during the acute phase of infection. The lowest concentration of SARS-CoV-2 viral copies this assay can detect is 131 copies/mL. A negative result does not preclude SARS-Cov-2 infection and should not be used as the sole basis for treatment or other patient management decisions. A negative result may occur with  improper specimen collection/handling, submission of specimen other than nasopharyngeal swab, presence of viral mutation(s) within the areas targeted by this assay, and inadequate number of viral copies (<131 copies/mL). A negative result must be combined with clinical observations, patient history, and epidemiological information. The expected result is Negative. Fact Sheet for Patients:  PinkCheek.be Fact Sheet for Healthcare Providers:  GravelBags.it This test is not yet ap proved or cleared by the [place] FDA and  has been authorized for detection and/or diagnosis of SARS-CoV-2 by FDA under an Emergency Use Authorization (EUA). This EUA will remain  in effect (meaning this test can be used) for the duration of the COVID-19  declaration under Section 564(b)(1) of the Act, 21 U.S.C. section 360bbb-3(b)(1), unless the authorization is terminated or revoked sooner.    Influenza A by PCR NEGATIVE NEGATIVE Final   Influenza B by PCR NEGATIVE NEGATIVE Final    Comment: (NOTE) The Xpert Xpress SARS-CoV-2/FLU/RSV assay is intended as an aid in  the diagnosis of influenza from Nasopharyngeal swab specimens and  should not be used as a sole basis for treatment. Nasal washings and  aspirates are unacceptable for Xpert Xpress SARS-CoV-2/FLU/RSV  testing. Fact Sheet for Patients: PinkCheek.be Fact Sheet for Healthcare Providers: GravelBags.it This test is not yet approved or cleared by the [place] FDA and  has been authorized for detection and/or diagnosis of SARS-CoV-2 by  FDA under an Emergency Use Authorization (EUA). This EUA will remain  in effect (meaning this test can be used) for the duration of the  Covid-19 declaration under Section 564(b)(1) of the Act, 21  U.S.C. section 360bbb-3(b)(1), unless the authorization is  terminated or  revoked. Performed at [facility], [address], [place], [place] [address]   Urine culture     Status: Abnormal   Collection Time: 12/03/19  9:10 PM   Specimen: Urine, Random  Result Value Ref Range Status   Specimen Description   Final    URINE, RANDOM Performed at [facility], [address], [place], [place] [address]    Special Requests   Final    NONE Performed at [facility], [address] [place], [place] [address]    Culture (A)  Final    <10,000 COLONIES/mL INSIGNIFICANT GROWTH Performed at [facility], [place] [address], [place], [place] [address]    Report Status 12/05/2019 FINAL  Final  Blood culture (routine x 2)     Status: None (Preliminary result)   Collection Time: 12/03/19 10:29 PM   Specimen: BLOOD  Result Value Ref Range Status   Specimen  Description BLOOD BLOOD LEFT HAND  Final   Special Requests   Final    BOTTLES DRAWN AEROBIC AND ANAEROBIC Blood Culture adequate volume   Culture   Final    NO GROWTH 3 DAYS Performed at [facility], [address], [place], [place] [address]    Report Status PENDING  Incomplete  Blood culture (routine x 2)     Status: None (Preliminary result)   Collection Time: 12/03/19 10:29 PM   Specimen: BLOOD  Result Value Ref Range Status   Specimen Description BLOOD BLOOD RIGHT FOREARM  Final   Special Requests   Final    BOTTLES DRAWN AEROBIC AND ANAEROBIC Blood Culture adequate volume   Culture   Final    NO GROWTH 3 DAYS Performed at [facility], [address], [place], [place] [address]    Report Status PENDING  Incomplete  MRSA PCR Screening     Status: None   Collection Time: 12/04/19  3:22 AM   Specimen: Nasopharyngeal  Result Value Ref Range Status   MRSA by PCR NEGATIVE NEGATIVE Final    Comment:        The GeneXpert MRSA Assay (FDA approved for NASAL specimens only), is one component of a comprehensive MRSA colonization surveillance program. It is not intended to diagnose MRSA infection nor to guide or monitor treatment for MRSA infections. Performed at [facility], [address], [place], [place] [address]          Radiology Studies: ECHOCARDIOGRAM COMPLETE  Result Date: 12/05/2019    ECHOCARDIOGRAM REPORT   Patient Name:   RAELENE SIRES Date of Exam: 12/04/2019 Medical Rec #:  [id number]      Height:       65.0 in Accession #:    [id number]     Weight:       183.0 lb Date of Birth:  March 30, 1946      BSA:          1.905 m Patient Age:    41 years       BP:           149/72 mmHg Patient Gender: F              HR:           95 bpm. Exam Location:  [facility] Procedure: 2D Echo, Cardiac Doppler and Color Doppler Indications:     CHF 428.21  History:         Patient has prior history of Echocardiogram examinations. CHF,  COPD; Risk  Factors:Hypertension.  Sonographer:     Alyse Low Roar Referring Phys:  [id number] Bradly Bienenstock Diagnosing Phys: Isaias Cowman MD IMPRESSIONS  1. Left ventricular ejection fraction, by estimation, is 30 to 35%. The left ventricle has moderately decreased function. The left ventricle has no regional wall motion abnormalities. There is mild left ventricular hypertrophy. Left ventricular diastolic parameters were normal.  2. Right ventricular systolic function is normal. The right ventricular size is normal.  3. The mitral valve is normal in structure. No evidence of mitral valve regurgitation. Moderate to severe mitral stenosis.  4. Tricuspid valve regurgitation is mild to moderate.  5. The aortic valve is normal in structure. Aortic valve regurgitation is not visualized. No aortic stenosis is present.  6. The inferior vena cava is normal in size with greater than 50% respiratory variability, suggesting right atrial pressure of 3 mmHg. FINDINGS  Left Ventricle: Left ventricular ejection fraction, by estimation, is 30 to 35%. The left ventricle has moderately decreased function. The left ventricle has no regional wall motion abnormalities. The left ventricular internal cavity size was normal in size. There is mild left ventricular hypertrophy. Left ventricular diastolic parameters were normal. Right Ventricle: The right ventricular size is normal. No increase in right ventricular wall thickness. Right ventricular systolic function is normal. Left Atrium: Left atrial size was normal in size. Right Atrium: Right atrial size was normal in size. Pericardium: There is no evidence of pericardial effusion. Mitral Valve: The mitral valve is normal in structure. Normal mobility of the mitral valve leaflets. No evidence of mitral valve regurgitation. Moderate to severe mitral valve stenosis. Tricuspid Valve: The tricuspid valve is normal in structure. Tricuspid valve regurgitation is mild to moderate. No evidence of  tricuspid stenosis. Aortic Valve: The aortic valve is normal in structure. Aortic valve regurgitation is not visualized. No aortic stenosis is present. Aortic valve mean gradient measures 2.0 mmHg. Aortic valve peak gradient measures 4.2 mmHg. Aortic valve area, by VTI measures 1.98 cm. Pulmonic Valve: The pulmonic valve was normal in structure. Pulmonic valve regurgitation is not visualized. No evidence of pulmonic stenosis. Aorta: The aortic root is normal in size and structure. Venous: The inferior vena cava is normal in size with greater than 50% respiratory variability, suggesting right atrial pressure of 3 mmHg. IAS/Shunts: No atrial level shunt detected by color flow Doppler.  LEFT VENTRICLE PLAX 2D LVIDd:         4.69 cm  Diastology LVIDs:         4.04 cm  LV e' lateral:   12.40 cm/s LV PW:         1.22 cm  LV E/e' lateral: 13.7 LV IVS:        1.41 cm  LV e' medial:    6.96 cm/s LVOT diam:     1.80 cm  LV E/e' medial:  24.4 LV SV:         35 LV SV Index:   19 LVOT Area:     2.54 cm  RIGHT VENTRICLE RV Mid diam:    2.52 cm RV S prime:     10.30 cm/s TAPSE (M-mode): 1.9 cm LEFT ATRIUM             Index       RIGHT ATRIUM           Index LA diam:        4.60 cm 2.41 cm/m  RA Area:     12.00 cm LA Vol (A2C):  86.4 ml 45.36 ml/m RA Volume:   26.70 ml  14.02 ml/m LA Vol (A4C):   88.0 ml 46.20 ml/m LA Biplane Vol: 89.8 ml 47.14 ml/m  AORTIC VALVE                   PULMONIC VALVE AV Area (Vmax):    2.03 cm    PV Vmax:        0.86 m/s AV Area (Vmean):   1.96 cm    PV Peak grad:   2.9 mmHg AV Area (VTI):     1.98 cm    RVOT Peak grad: 2 mmHg AV Vmax:           103.00 cm/s AV Vmean:          72.100 cm/s AV VTI:            0.179 m AV Peak Grad:      4.2 mmHg AV Mean Grad:      2.0 mmHg LVOT Vmax:         82.30 cm/s LVOT Vmean:        55.400 cm/s LVOT VTI:          0.139 m LVOT/AV VTI ratio: 0.78  AORTA Ao Root diam: 2.40 cm MITRAL VALVE MV Area (PHT): 4.29 cm     SHUNTS MV Decel Time: 177 msec     Systemic  VTI:  0.14 m MV E velocity: 170.00 cm/s  Systemic Diam: 1.80 cm Isaias Cowman MD Electronically signed by Isaias Cowman MD Signature Date/Time: 12/05/2019/9:22:18 AM    Final         Scheduled Meds: . amiodarone  200 mg Oral Q12H   Followed by  . [START ON 12/11/2019] amiodarone  200 mg Oral Daily  . aspirin EC  81 mg Oral Daily  . carvedilol  12.5 mg Oral BID WC  . Chlorhexidine Gluconate Cloth  6 each Topical Daily  . docusate sodium  200 mg Oral BID  . enoxaparin (LOVENOX) injection  40 mg Subcutaneous Q24H  . famotidine  20 mg Oral BID  . furosemide  40 mg Intravenous Daily  . insulin aspart  0-15 Units Subcutaneous TID WC  . insulin aspart  0-5 Units Subcutaneous QHS  . insulin detemir  8 Units Subcutaneous Daily  . losartan  25 mg Oral Daily  . mometasone-formoterol  2 puff Inhalation BID  . predniSONE  20 mg Oral Q breakfast  . Ensure Max Protein  11 oz Oral BID  . rosuvastatin  20 mg Oral q1800  . sertraline  100 mg Oral Daily  . tiotropium  18 mcg Inhalation Daily   Continuous Infusions:   LOS: 3 days    Time spent: 31 mins     Wyvonnia Dusky, MD [facility] Pager [phone]  If 7PM-7AM, please contact night-coverage [email or web address] 12/06/2019, 3:07 PM

## 2019-12-06 NOTE — Progress Notes (Signed)
[facility]  SUBJECTIVE: Patient laying in bed, reports feeling better, on O2 by nasal cannula, denies chest pain   Vitals:   12/06/19 0443 12/06/19 0500 12/06/19 0750 12/06/19 0754  BP: (!) 121/51     Pulse: 72     Resp: 18     Temp: 98.3 F (36.8 C)     TempSrc: Oral     SpO2: 98%  (!) 89% 92%  Weight:  80.7 kg    Height:         Intake/Output Summary (Last 24 hours) at 12/06/2019 0904 Last data filed at 12/06/2019 0443 Gross per 24 hour  Intake 120 ml  Output 500 ml  Net -380 ml      PHYSICAL EXAM  General: Well developed, well nourished, in no acute distress HEENT:  Normocephalic and atramatic Neck:  No JVD.  Lungs: Clear bilaterally to auscultation and percussion. Heart: HRRR . Normal S1 and S2 without gallops or murmurs.  Abdomen: Bowel sounds are positive, abdomen soft and non-tender  Msk:  Back normal, normal gait. Normal strength and tone for age. Extremities: No clubbing, cyanosis or edema.   Neuro: Alert and oriented X 3. Psych:  Good affect, responds appropriately   LABS: Basic Metabolic Panel: Recent Labs    12/04/19 0407 12/04/19 0407 12/05/19 0539 12/06/19 0421  NA 140   < > 142 141  K 3.4*   < > 3.7 4.1  CL 103   < > 101 100  CO2 27   < > 32 32  GLUCOSE 169*   < > 107* 90  BUN 13   < > 16 21  CREATININE 0.69   < > 0.76 0.69  CALCIUM 8.4*   < > 9.0 9.2  MG 1.9  --  2.1  --   PHOS 2.3*  --  3.7  --    < > = values in this interval not displayed.   Liver Function Tests: Recent Labs    12/03/19 2110 12/04/19 0407  AST 67* 104*  ALT 49* 103*  ALKPHOS 124 109  BILITOT 0.9 0.8  PROT 7.8 5.9*  ALBUMIN 4.6 3.4*   No results for input(s): LIPASE, AMYLASE in the last 72 hours. CBC: Recent Labs    12/03/19 2110 12/03/19 2110 12/04/19 0407 12/06/19 0421  WBC 11.9*   < > 9.8 8.8  NEUTROABS 6.1  --  9.1*  --   HGB 14.8   < > 13.0 11.3*  HCT 47.4*   < > 38.6 36.5  MCV 87.5   < > 84.3 87.1  PLT 219   < > 154 118*   < > = values in  this interval not displayed.   Cardiac Enzymes: No results for input(s): CKTOTAL, CKMB, CKMBINDEX, TROPONINI in the last 72 hours. BNP: Invalid input(s): POCBNP D-Dimer: No results for input(s): DDIMER in the last 72 hours. Hemoglobin A1C: Recent Labs    12/04/19 0231  HGBA1C 6.1*   Fasting Lipid Panel: Recent Labs    12/03/19 2320  TRIG 163*   Thyroid Function Tests: No results for input(s): TSH, T4TOTAL, T3FREE, THYROIDAB in the last 72 hours.  Invalid input(s): FREET3 Anemia Panel: No results for input(s): VITAMINB12, FOLATE, FERRITIN, TIBC, IRON, RETICCTPCT in the last 72 hours.  ECHOCARDIOGRAM COMPLETE  Result Date: 12/05/2019    ECHOCARDIOGRAM REPORT   Patient Name:   Sarah White Date of Exam: 12/04/2019 Medical Rec #:  [id number]      Height:  65.0 in Accession #:    [id number]     Weight:       183.0 lb Date of Birth:  1946-06-26      BSA:          1.905 m Patient Age:    74 years       BP:           149/72 mmHg Patient Gender: F              HR:           95 bpm. Exam Location:  [facility] Procedure: 2D Echo, Cardiac Doppler and Color Doppler Indications:     CHF 428.21  History:         Patient has prior history of Echocardiogram examinations. CHF,                  COPD; Risk Factors:Hypertension.  Sonographer:     Alyse Low Roar Referring Phys:  [id number] Bradly Bienenstock Diagnosing Phys: Isaias Cowman MD IMPRESSIONS  1. Left ventricular ejection fraction, by estimation, is 30 to 35%. The left ventricle has moderately decreased function. The left ventricle has no regional wall motion abnormalities. There is mild left ventricular hypertrophy. Left ventricular diastolic parameters were normal.  2. Right ventricular systolic function is normal. The right ventricular size is normal.  3. The mitral valve is normal in structure. No evidence of mitral valve regurgitation. Moderate to severe mitral stenosis.  4. Tricuspid valve regurgitation is mild to moderate.  5. The aortic valve  is normal in structure. Aortic valve regurgitation is not visualized. No aortic stenosis is present.  6. The inferior vena cava is normal in size with greater than 50% respiratory variability, suggesting right atrial pressure of 3 mmHg. FINDINGS  Left Ventricle: Left ventricular ejection fraction, by estimation, is 30 to 35%. The left ventricle has moderately decreased function. The left ventricle has no regional wall motion abnormalities. The left ventricular internal cavity size was normal in size. There is mild left ventricular hypertrophy. Left ventricular diastolic parameters were normal. Right Ventricle: The right ventricular size is normal. No increase in right ventricular wall thickness. Right ventricular systolic function is normal. Left Atrium: Left atrial size was normal in size. Right Atrium: Right atrial size was normal in size. Pericardium: There is no evidence of pericardial effusion. Mitral Valve: The mitral valve is normal in structure. Normal mobility of the mitral valve leaflets. No evidence of mitral valve regurgitation. Moderate to severe mitral valve stenosis. Tricuspid Valve: The tricuspid valve is normal in structure. Tricuspid valve regurgitation is mild to moderate. No evidence of tricuspid stenosis. Aortic Valve: The aortic valve is normal in structure. Aortic valve regurgitation is not visualized. No aortic stenosis is present. Aortic valve mean gradient measures 2.0 mmHg. Aortic valve peak gradient measures 4.2 mmHg. Aortic valve area, by VTI measures 1.98 cm. Pulmonic Valve: The pulmonic valve was normal in structure. Pulmonic valve regurgitation is not visualized. No evidence of pulmonic stenosis. Aorta: The aortic root is normal in size and structure. Venous: The inferior vena cava is normal in size with greater than 50% respiratory variability, suggesting right atrial pressure of 3 mmHg. IAS/Shunts: No atrial level shunt detected by color flow Doppler.  LEFT VENTRICLE PLAX 2D LVIDd:          4.69 cm  Diastology LVIDs:         4.04 cm  LV e' lateral:   12.40 cm/s LV PW:  1.22 cm  LV E/e' lateral: 13.7 LV IVS:        1.41 cm  LV e' medial:    6.96 cm/s LVOT diam:     1.80 cm  LV E/e' medial:  24.4 LV SV:         35 LV SV Index:   19 LVOT Area:     2.54 cm  RIGHT VENTRICLE RV Mid diam:    2.52 cm RV S prime:     10.30 cm/s TAPSE (M-mode): 1.9 cm LEFT ATRIUM             Index       RIGHT ATRIUM           Index LA diam:        4.60 cm 2.41 cm/m  RA Area:     12.00 cm LA Vol (A2C):   86.4 ml 45.36 ml/m RA Volume:   26.70 ml  14.02 ml/m LA Vol (A4C):   88.0 ml 46.20 ml/m LA Biplane Vol: 89.8 ml 47.14 ml/m  AORTIC VALVE                   PULMONIC VALVE AV Area (Vmax):    2.03 cm    PV Vmax:        0.86 m/s AV Area (Vmean):   1.96 cm    PV Peak grad:   2.9 mmHg AV Area (VTI):     1.98 cm    RVOT Peak grad: 2 mmHg AV Vmax:           103.00 cm/s AV Vmean:          72.100 cm/s AV VTI:            0.179 m AV Peak Grad:      4.2 mmHg AV Mean Grad:      2.0 mmHg LVOT Vmax:         82.30 cm/s LVOT Vmean:        55.400 cm/s LVOT VTI:          0.139 m LVOT/AV VTI ratio: 0.78  AORTA Ao Root diam: 2.40 cm MITRAL VALVE MV Area (PHT): 4.29 cm     SHUNTS MV Decel Time: 177 msec     Systemic VTI:  0.14 m MV E velocity: 170.00 cm/s  Systemic Diam: 1.80 cm Isaias Cowman MD Electronically signed by Isaias Cowman MD Signature Date/Time: 12/05/2019/9:22:18 AM    Final      Echo LVEF 30 to 35%  TELEMETRY: Sinus rhythm:  ASSESSMENT AND PLAN:  Active Problems:   Acute respiratory failure with hypoxia and hypercarbia (HCC)    1.  Respiratory failure, multifactorial, likely primarily due to underlying COPD and ongoing tobacco abuse, with acute systolic congestive heart failure, improving, on O2 by nasal cannula 2.  Acute systolic congestive heart failure, LVEF 30 to 35%, BNP 777 3.  Coronary artery disease, known three-vessel CAD, with chronically occluded proximal left circumflex and  distal RCA by cardiac catheterization 11/26/2016, without chest pain, with borderline elevated high-sensitivity troponin 11 and 32, very likely demand supply ischemia, ECG nondiagnostic 4.  Ventricular tachycardia, in the setting of respiratory failure post intubation, no ECG or telemetry strips for review, was on amiodarone drip, converted to p.o. amiodarone, without recurrence 5.  COPD exacerbation, ongoing tobacco abuse  Recommendations  1.  Agree with overall current therapy 2.  Continue diuresis 3.  Carefully monitor renal status 4.  Continue amiodarone 200 mg p.o. twice daily  for now, will reduce dose to 200 mg daily as outpatient with Dr. Clayborn Bigness 5.  Continue carvedilol 12.5 mg p.o. twice daily 6.  Start low-dose lisinopril 5 mg daily 7.  Possible discharge in a.m. 8.  Follow-up with Dr. Clayborn Bigness 1 week   Isaias Cowman, MD, PhD, [facility] 12/06/2019 9:04 AM

## 2019-12-07 DIAGNOSIS — I5023 Acute on chronic systolic (congestive) heart failure: Secondary | ICD-10-CM

## 2019-12-07 LAB — GLUCOSE, CAPILLARY: Glucose-Capillary: 107 mg/dL — ABNORMAL HIGH (ref 70–99)

## 2019-12-07 LAB — COMPREHENSIVE METABOLIC PANEL
ALT: 46 U/L — ABNORMAL HIGH (ref 0–44)
AST: 22 U/L (ref 15–41)
Albumin: 3.6 g/dL (ref 3.5–5.0)
Alkaline Phosphatase: 83 U/L (ref 38–126)
Anion gap: 11 (ref 5–15)
BUN: 27 mg/dL — ABNORMAL HIGH (ref 8–23)
CO2: 30 mmol/L (ref 22–32)
Calcium: 9.3 mg/dL (ref 8.9–10.3)
Chloride: 98 mmol/L (ref 98–111)
Creatinine, Ser: 0.69 mg/dL (ref 0.44–1.00)
GFR calc Af Amer: >60 mL/min (ref >60)
GFR calc non Af Amer: >60 mL/min (ref >60)
Glucose, Bld: 102 mg/dL — ABNORMAL HIGH (ref 70–99)
Potassium: 3.8 mmol/L (ref 3.5–5.1)
Sodium: 139 mmol/L (ref 135–145)
Total Bilirubin: 0.6 mg/dL (ref 0.3–1.2)
Total Protein: 6.2 g/dL — ABNORMAL LOW (ref 6.5–8.1)

## 2019-12-07 LAB — CBC
HCT: 38.1 % (ref 36.0–46.0)
Hemoglobin: 12.1 g/dL (ref 12.0–15.0)
MCH: 26.8 pg (ref 26.0–34.0)
MCHC: 31.8 g/dL (ref 30.0–36.0)
MCV: 84.3 fL (ref 80.0–100.0)
Platelets: 125 10*3/uL — ABNORMAL LOW (ref 150–400)
RBC: 4.52 MIL/uL (ref 3.87–5.11)
RDW: 14.8 % (ref 11.5–15.5)
WBC: 9.7 10*3/uL (ref 4.0–10.5)
nRBC: 0 % (ref 0.0–0.2)

## 2019-12-07 MED ORDER — PREDNISONE 20 MG PO TABS: 20.0000 mg | ORAL_TABLET | Freq: Every day | ORAL | 0 refills | Status: AC

## 2019-12-07 MED ORDER — LOSARTAN POTASSIUM 25 MG PO TABS: 25.0000 mg | ORAL_TABLET | Freq: Every day | ORAL | 0 refills | Status: DC

## 2019-12-07 MED ORDER — AMIODARONE HCL 200 MG PO TABS: 200.0000 mg | ORAL_TABLET | Freq: Two times a day (BID) | ORAL | 0 refills | Status: DC

## 2019-12-07 NOTE — Discharge Summary (Signed)
Physician Discharge Summary  DESTINY GASAWAY [id number] DOB: February 25, 1946 DOA: 12/03/2019  PCP: Valerie Roys, DO  Admit date: 12/03/2019 Discharge date: 12/07/2019  Admitted From: home Disposition: home  Recommendations for Outpatient Follow-up:  1. Follow up with PCP in 1-2 weeks 2. F/u cardio, Dr. 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  Patient is currently intubated and sedated and no family is present, therefore history is obtained from ED and nursing notes.  Per notes EMS was called today as the patient was having progressive shortness of breath over the last several days.  EMS found her tachypneic, hypoxic with O2 sats in the 50's,  but she was able to converse.  In route to the hospital she became decreasingly responsive with eventual agonal respirations.  Upon arrival to the ED she was noted to have a GCS of 3, therefore she was emergently intubated.  Initial blood gas showed severe hypoxic hypercapnic respiratory failure.  Shortly after intubation she developed what appeared to be ventricular tachycardia (never lost pulse) of which she was given amiodarone bolus and placed on amiodarone drip.  EKG revealed left bundle branch block (not new) and no ST changes.  Chest x-ray concerning for cardiomegaly and pulmonary vascular congestion.  CT head is negative for any acute intracranial abnormality.  Lab work revealed BNP 578, high-sensitivity troponin 11, lactic acid 3, WBC 11.9 with lymphocytosis, procalcitonin 0.11.  Her SARS-CoV-2 PCR and influenza PCR both  are negative.  She was noted to be slightly febrile with temperature 100.4.  She was given 1 L IV fluids, broad-spectrum antibiotics,  125 mg of IV Solu-Medrol, and 3 duo nebs in the ED.  PCCM is asked admit the patient for further work-up and treatment of acute on chronic hypoxic hypercapnic respiratory failure secondary to acute decompensated HFrEF and acute COPD exacerbation.  Hospital course from Dr. Lenise Herald 12/06/19-12/07/19: Pt was found to acute respiratory failure secondary to acute decompensated CHF & acute COPD exacerbation. Pt was initially admitted to the ICU where the pt was intubated and was extubated on 12/04/19. Pt was treated w/ IV lasix, bronchodilators, abxs and supplemental oxygen. Pt was responded relatively well to above and below stated treatment. Pt was able to be weaned off of supplemental oxygen prior to d/c. Of note, cardio saw the pt for acute CHF exacerbation as well as ventricular tachycardia. Pt was started on amiodarone and pt was d/c with this medication as well as per cardio. Pt will f/u w/ Dr. Clayborn Bigness (cardio) in 1 week outpatient. Pt verbalized her understanding. Furthermore, pt was sent home w/ 5 days more of prednisone to complete the course for a COPD exacerbation. For more information, please see previous progress notes.    Discharge Diagnoses:  Active Problems:   Acute respiratory failure with hypoxia and hypercarbia (HCC)   Acute on chronic hypoxic hypercapnic respiratory failure: secondary to acute decompensatedsystolic CHF &acute COPD exacerbation. S/p extubation 12/04/19.Continue on bronchodilators and prednisone. Continue IV Lasix. Monitor I/Os. Covid 19 PCR is negative. Cardio following and recs apprec  Acute decompensated systolic CHF exacerbation: EF 40-45% in September 2020 & EF 30-35%  currently. Continue on IV lasix. Monitor I/Os. Cardio following and recs apprec   Ventricular tachycardia: continue amiodarone 200mg  BID & will reduce to 200mg  daily  as an outpatient w/ cardio. Defer full dose anticoagulation at this time as per cardio.   Leukocytosis: resolved. COVID-19 PCR and influenza PCR both negative Received cefepime, Flagyl, vancomycin ED  Acute metabolic encephalopathy: secondary to severe hypercapnia. CT head negative on 12/03/19.  Likely close to baseline    Discharge Instructions  Discharge Instructions    Diet - low sodium heart healthy   Complete by: As directed    Diet Carb Modified   Complete by: As directed    Discharge instructions   Complete by: As directed    F/u PCP in 2 weeks; F/u cardio (Dr. Clayborn Bigness) in 1 week   Increase activity slowly   Complete by: As directed      Allergies as of 12/07/2019      Reactions   Amlodipine Swelling   Atorvastatin Other (See Comments)   myalgia   Codeine Nausea And Vomiting   Hctz [hydrochlorothiazide] Other (See Comments)   Hypokalemia   Lisinopril Other (See Comments)   Dizziness   Metformin And Related Nausea Only   Nausea, dizziness      Medication List    TAKE these medications   albuterol 108 (90 Base) MCG/ACT inhaler Commonly known as: VENTOLIN HFA Inhale 2 puffs into the lungs every 6 (six) hours as needed for wheezing or shortness of breath.   amiodarone 200 MG tablet Commonly known as: PACERONE Take 1 tablet (200 mg total) by mouth every 12 (twelve) hours.   aspirin EC 81 MG tablet Take 81 mg by mouth at bedtime.   budesonide-formoterol 160-4.5 MCG/ACT inhaler Commonly known as: SYMBICORT Inhale 2 puffs into the lungs 2 (two) times daily.   carvedilol 12.5 MG tablet Commonly known as: COREG TAKE 2 TABLETS BY MOUTH TWICE DAILY WITH A MEAL What changed:   how much to take  how to take this  when to take this  additional instructions   cyclobenzaprine 10 MG tablet Commonly known as: FLEXERIL Take 1 tablet (10 mg total) by mouth 3 (three) times daily as needed for muscle spasms. DO NOT DRIVE ON THIS MEDICINE- WILL MAKE YOU SLEEPY    furosemide 40 MG tablet Commonly known as: LASIX Take 1 tablet (40 mg total) by mouth daily.   isosorbide mononitrate 60 MG 24 hr tablet Commonly known as: IMDUR Take 1 tablet (60 mg total) by mouth daily.   losartan 25 MG tablet Commonly known as: COZAAR Take 1 tablet (25 mg total) by mouth daily. Start taking on: December 08, 2019   multivitamin with minerals Tabs tablet Take 1 tablet by mouth daily.   ondansetron 4 MG disintegrating tablet Commonly known as: ZOFRAN-ODT Take 4 mg by mouth every 8 (eight) hours as needed for nausea or vomiting.   predniSONE 20 MG tablet Commonly known as: DELTASONE Take 1 tablet (20 mg total) by mouth daily with breakfast for 5 days. Start taking on: December 08, 2019   rosuvastatin 20 MG tablet Commonly known as: Crestor Take 1 tablet (20 mg total) by mouth daily.   sertraline 100 MG tablet Commonly known as: ZOLOFT Take 2 tablets (200 mg total) by mouth daily.   Spiriva HandiHaler 18 MCG inhalation capsule Generic drug: tiotropium Place 1 capsule (18 mcg total) into inhaler and inhale daily.   traZODone 50 MG tablet Commonly known as: DESYREL TAKE 1/2 TO 1 (  ONE-HALF TO ONE) TABLET BY MOUTH AT BEDTIME AS NEEDED FOR SLEEP What changed: See the new instructions.   triamcinolone ointment 0.5 % Commonly known as: KENALOG Apply 1 application topically 2 (two) times daily.   Trulicity 1.5 0000000 Sopn Generic drug: Dulaglutide Inject 1.5 mg into the skin once a week.      Follow-up Information    [facility] up on 12/18/2019.   Specialty: Cardiology Why: at 12:30pm. Enter through the [place] entrance Contact information: [place]         Allergies  Allergen Reactions  . Amlodipine Swelling  . Atorvastatin Other (See Comments)    myalgia  . Codeine Nausea And Vomiting  . Hctz [Hydrochlorothiazide] Other  (See Comments)    Hypokalemia  . Lisinopril Other (See Comments)    Dizziness   . Metformin And Related Nausea Only    Nausea, dizziness    Consultations:  Cardio  ICU   Procedures/Studies: CT Head Wo Contrast  Result Date: 12/03/2019 CLINICAL DATA:  Respiratory distress.  Became unresponsive. EXAM: CT HEAD WITHOUT CONTRAST TECHNIQUE: Contiguous axial images were obtained from the base of the skull through the vertex without intravenous contrast. COMPARISON:  None. FINDINGS: Brain: No acute intracranial abnormality. Specifically, no hemorrhage, hydrocephalus, mass lesion, acute infarction, or significant intracranial injury. Vascular: No hyperdense vessel or unexpected calcification. Skull: No acute calvarial abnormality. Sinuses/Orbits: Visualized paranasal sinuses and mastoids clear. Orbital soft tissues unremarkable. Other: None IMPRESSION: No acute intracranial abnormality. Electronically Signed   By: Rolm Baptise M.D.   On: 12/03/2019 21:56   DG Chest Port 1 View  Result Date: 12/04/2019 CLINICAL DATA:  Acute respiratory failure EXAM: PORTABLE CHEST 1 VIEW COMPARISON:  Yesterday FINDINGS: Endotracheal tube tip is just below the clavicular heads. The enteric tube at least reaches the stomach. Normal heart size and mediastinal contours. Prominent interstitial markings at the bases. No visible effusion or pneumothorax. IMPRESSION: 1. Unremarkable hardware positioning. 2. Similar degree of interstitial coarsening favoring atelectasis or vascular congestion. Electronically Signed   By: Monte Fantasia M.D.   On: 12/04/2019 07:30   DG Chest Portable 1 View  Result Date: 12/03/2019 CLINICAL DATA:  Intubation EXAM: PORTABLE CHEST 1 VIEW COMPARISON:  05/15/2019 FINDINGS: The endotracheal tube terminates approximately 5.6 cm above the carina. The enteric tube extends below the left hemidiaphragm and terminates over the gastric body. There is persistent cardiomegaly. Prominent interstitial lung  markings are noted. There is no pneumothorax. There may be trace to small bilateral pleural effusions. Aortic calcifications are noted. There is no acute osseous abnormality. IMPRESSION: 1. Endotracheal tube terminates approximately 5.6 cm above the carina. 2. Persistent cardiomegaly and pulmonary vascular congestion. 3. Possible trace to small bilateral pleural effusions. Electronically Signed   By: Constance Holster M.D.   On: 12/03/2019 21:24   ECHOCARDIOGRAM COMPLETE  Result Date: 12/05/2019    ECHOCARDIOGRAM REPORT   Patient Name:   RENESMEE NOVINGER Date of Exam: 12/04/2019 Medical Rec #:  [id number]      Height:       65.0 in Accession #:    [id number]     Weight:       183.0 lb Date of Birth:  01-11-1946      BSA:          1.905 m Patient Age:    74 years       BP:  149/72 mmHg Patient Gender: F              HR:           95 bpm. Exam Location:  [facility] Procedure: 2D Echo, Cardiac Doppler and Color Doppler Indications:     CHF 428.21  History:         Patient has prior history of Echocardiogram examinations. CHF,                  COPD; Risk Factors:Hypertension.  Sonographer:     Alyse Low Roar Referring Phys:  [id number] Bradly Bienenstock Diagnosing Phys: Isaias Cowman MD IMPRESSIONS  1. Left ventricular ejection fraction, by estimation, is 30 to 35%. The left ventricle has moderately decreased function. The left ventricle has no regional wall motion abnormalities. There is mild left ventricular hypertrophy. Left ventricular diastolic parameters were normal.  2. Right ventricular systolic function is normal. The right ventricular size is normal.  3. The mitral valve is normal in structure. No evidence of mitral valve regurgitation. Moderate to severe mitral stenosis.  4. Tricuspid valve regurgitation is mild to moderate.  5. The aortic valve is normal in structure. Aortic valve regurgitation is not visualized. No aortic stenosis is present.  6. The inferior vena cava is normal in size with greater than  50% respiratory variability, suggesting right atrial pressure of 3 mmHg. FINDINGS  Left Ventricle: Left ventricular ejection fraction, by estimation, is 30 to 35%. The left ventricle has moderately decreased function. The left ventricle has no regional wall motion abnormalities. The left ventricular internal cavity size was normal in size. There is mild left ventricular hypertrophy. Left ventricular diastolic parameters were normal. Right Ventricle: The right ventricular size is normal. No increase in right ventricular wall thickness. Right ventricular systolic function is normal. Left Atrium: Left atrial size was normal in size. Right Atrium: Right atrial size was normal in size. Pericardium: There is no evidence of pericardial effusion. Mitral Valve: The mitral valve is normal in structure. Normal mobility of the mitral valve leaflets. No evidence of mitral valve regurgitation. Moderate to severe mitral valve stenosis. Tricuspid Valve: The tricuspid valve is normal in structure. Tricuspid valve regurgitation is mild to moderate. No evidence of tricuspid stenosis. Aortic Valve: The aortic valve is normal in structure. Aortic valve regurgitation is not visualized. No aortic stenosis is present. Aortic valve mean gradient measures 2.0 mmHg. Aortic valve peak gradient measures 4.2 mmHg. Aortic valve area, by VTI measures 1.98 cm. Pulmonic Valve: The pulmonic valve was normal in structure. Pulmonic valve regurgitation is not visualized. No evidence of pulmonic stenosis. Aorta: The aortic root is normal in size and structure. Venous: The inferior vena cava is normal in size with greater than 50% respiratory variability, suggesting right atrial pressure of 3 mmHg. IAS/Shunts: No atrial level shunt detected by color flow Doppler.  LEFT VENTRICLE PLAX 2D LVIDd:         4.69 cm  Diastology LVIDs:         4.04 cm  LV e' lateral:   12.40 cm/s LV PW:         1.22 cm  LV E/e' lateral: 13.7 LV IVS:        1.41 cm  LV e' medial:     6.96 cm/s LVOT diam:     1.80 cm  LV E/e' medial:  24.4 LV SV:         35 LV SV Index:   19 LVOT Area:  2.54 cm  RIGHT VENTRICLE RV Mid diam:    2.52 cm RV S prime:     10.30 cm/s TAPSE (M-mode): 1.9 cm LEFT ATRIUM             Index       RIGHT ATRIUM           Index LA diam:        4.60 cm 2.41 cm/m  RA Area:     12.00 cm LA Vol (A2C):   86.4 ml 45.36 ml/m RA Volume:   26.70 ml  14.02 ml/m LA Vol (A4C):   88.0 ml 46.20 ml/m LA Biplane Vol: 89.8 ml 47.14 ml/m  AORTIC VALVE                   PULMONIC VALVE AV Area (Vmax):    2.03 cm    PV Vmax:        0.86 m/s AV Area (Vmean):   1.96 cm    PV Peak grad:   2.9 mmHg AV Area (VTI):     1.98 cm    RVOT Peak grad: 2 mmHg AV Vmax:           103.00 cm/s AV Vmean:          72.100 cm/s AV VTI:            0.179 m AV Peak Grad:      4.2 mmHg AV Mean Grad:      2.0 mmHg LVOT Vmax:         82.30 cm/s LVOT Vmean:        55.400 cm/s LVOT VTI:          0.139 m LVOT/AV VTI ratio: 0.78  AORTA Ao Root diam: 2.40 cm MITRAL VALVE MV Area (PHT): 4.29 cm     SHUNTS MV Decel Time: 177 msec     Systemic VTI:  0.14 m MV E velocity: 170.00 cm/s  Systemic Diam: 1.80 cm Isaias Cowman MD Electronically signed by Isaias Cowman MD Signature Date/Time: 12/05/2019/9:22:18 AM    Final       Subjective: Pt denied any complaints    Discharge Exam: Vitals:   12/06/19 1953 12/07/19 0442  BP: 134/70 (!) 150/74  Pulse: 70 69  Resp: 20 20  Temp: 98.5 F (36.9 C) 98.2 F (36.8 C)  SpO2: 98% 94%   Vitals:   12/06/19 1130 12/06/19 1953 12/07/19 0254 12/07/19 0442  BP: 132/62 134/70  (!) 150/74  Pulse: 74 70  69  Resp: 16 20  20   Temp: 98.6 F (37 C) 98.5 F (36.9 C)  98.2 F (36.8 C)  TempSrc: Oral Oral  Oral  SpO2: 95% 98%  94%  Weight:   79.9 kg   Height:        General: Pt is alert, awake, not in acute distress Cardiovascular: S1/S2 +, no rubs, no gallops Respiratory: diminished breath sounds b/l, no rhonchi Abdominal: Soft, NT, ND, bowel sounds  + Extremities:  no cyanosis    The results of significant diagnostics from this hospitalization (including imaging, microbiology, ancillary and laboratory) are listed below for reference.     Microbiology: Recent Results (from the past 240 hour(s))  Respiratory Panel by RT PCR (Flu A&B, Covid) - Nasopharyngeal Swab     Status: None   Collection Time: 12/03/19  9:10 PM   Specimen: Nasopharyngeal Swab  Result Value Ref Range Status   SARS Coronavirus 2 by RT PCR NEGATIVE NEGATIVE Final  Comment: (NOTE) SARS-CoV-2 target nucleic acids are NOT DETECTED. The SARS-CoV-2 RNA is generally detectable in upper respiratoy specimens during the acute phase of infection. The lowest concentration of SARS-CoV-2 viral copies this assay can detect is 131 copies/mL. A negative result does not preclude SARS-Cov-2 infection and should not be used as the sole basis for treatment or other patient management decisions. A negative result may occur with  improper specimen collection/handling, submission of specimen other than nasopharyngeal swab, presence of viral mutation(s) within the areas targeted by this assay, and inadequate number of viral copies (<131 copies/mL). A negative result must be combined with clinical observations, patient history, and epidemiological information. The expected result is Negative. Fact Sheet for Patients:  PinkCheek.be Fact Sheet for Healthcare Providers:  GravelBags.it This test is not yet ap proved or cleared by the [place] FDA and  has been authorized for detection and/or diagnosis of SARS-CoV-2 by FDA under an Emergency Use Authorization (EUA). This EUA will remain  in effect (meaning this test can be used) for the duration of the COVID-19 declaration under Section 564(b)(1) of the Act, 21 U.S.C. section 360bbb-3(b)(1), unless the authorization is terminated or revoked sooner.    Influenza A by PCR  NEGATIVE NEGATIVE Final   Influenza B by PCR NEGATIVE NEGATIVE Final    Comment: (NOTE) The Xpert Xpress SARS-CoV-2/FLU/RSV assay is intended as an aid in  the diagnosis of influenza from Nasopharyngeal swab specimens and  should not be used as a sole basis for treatment. Nasal washings and  aspirates are unacceptable for Xpert Xpress SARS-CoV-2/FLU/RSV  testing. Fact Sheet for Patients: PinkCheek.be Fact Sheet for Healthcare Providers: GravelBags.it This test is not yet approved or cleared by the [place] FDA and  has been authorized for detection and/or diagnosis of SARS-CoV-2 by  FDA under an Emergency Use Authorization (EUA). This EUA will remain  in effect (meaning this test can be used) for the duration of the  Covid-19 declaration under Section 564(b)(1) of the Act, 21  U.S.C. section 360bbb-3(b)(1), unless the authorization is  terminated or revoked. Performed at [facility], [address], [place], [place] [address]   Urine culture     Status: Abnormal   Collection Time: 12/03/19  9:10 PM   Specimen: Urine, Random  Result Value Ref Range Status   Specimen Description   Final    URINE, RANDOM Performed at [facility], [address], [place], [place] [address]    Special Requests   Final    NONE Performed at [facility], [address] [place], [place] [address]    Culture (A)  Final    <10,000 COLONIES/mL INSIGNIFICANT GROWTH Performed at [facility], [place] [address], [place], [place] [address]    Report Status 12/05/2019 FINAL  Final  Blood culture (routine x 2)     Status: None (Preliminary result)   Collection Time: 12/03/19 10:29 PM   Specimen: BLOOD  Result Value Ref Range Status   Specimen Description BLOOD BLOOD LEFT HAND  Final   Special Requests   Final    BOTTLES DRAWN AEROBIC AND ANAEROBIC Blood Culture adequate volume   Culture   Final    NO GROWTH  4 DAYS Performed at [facility], [address] [place], [place] [address]    Report Status PENDING  Incomplete  Blood culture (routine x 2)     Status: None (Preliminary result)   Collection Time: 12/03/19 10:29 PM   Specimen: BLOOD  Result Value Ref Range Status   Specimen Description BLOOD BLOOD RIGHT FOREARM  Final   Special Requests   Final    BOTTLES DRAWN AEROBIC AND ANAEROBIC Blood Culture adequate volume   Culture   Final    NO GROWTH 4 DAYS Performed at [facility], [address] [place], [place] [address]    Report Status PENDING  Incomplete  MRSA PCR Screening     Status: None   Collection Time: 12/04/19  3:22 AM   Specimen: Nasopharyngeal  Result Value Ref Range Status   MRSA by PCR NEGATIVE NEGATIVE Final    Comment:        The GeneXpert MRSA Assay (FDA approved for NASAL specimens only), is one component of a comprehensive MRSA colonization surveillance program. It is not intended to diagnose MRSA infection nor to guide or monitor treatment for MRSA infections. Performed at [facility], [address] [place],  [address]      Labs: BNP (last 3 results) Recent Labs    05/15/19 0808 12/03/19 2110 12/04/19 0407  BNP 424.0* 578.0* 0000000*   Basic Metabolic Panel: Recent Labs  Lab 12/03/19 2110 12/04/19 0407 12/05/19 0539 12/06/19 0421 12/07/19 0414  NA 139 140 142 141 139  K 4.1 3.4* 3.7 4.1 3.8  CL 101 103 101 100 98  CO2 23 27 32 32 30  GLUCOSE 203* 169* 107* 90 102*  BUN 11 13 16 21  27*  CREATININE 0.93 0.69 0.76 0.69 0.69  CALCIUM 9.1 8.4* 9.0 9.2 9.3  MG 2.3 1.9 2.1  --   --   PHOS  --  2.3* 3.7  --   --    Liver Function Tests: Recent Labs  Lab 12/03/19 2110 12/04/19 0407 12/07/19 0414  AST 67* 104* 22  ALT 49* 103* 46*  ALKPHOS 124 109 83  BILITOT 0.9 0.8 0.6  PROT 7.8 5.9* 6.2*  ALBUMIN 4.6 3.4* 3.6   No results for input(s): LIPASE, AMYLASE in the last 168 hours. No results for  input(s): AMMONIA in the last 168 hours. CBC: Recent Labs  Lab 12/03/19 2110 12/04/19 0407 12/06/19 0421 12/07/19 0414  WBC 11.9* 9.8 8.8 9.7  NEUTROABS 6.1 9.1*  --   --   HGB 14.8 13.0 11.3* 12.1  HCT 47.4* 38.6 36.5 38.1  MCV 87.5 84.3 87.1 84.3  PLT 219 154 118* 125*   Cardiac Enzymes: No results for input(s): CKTOTAL, CKMB, CKMBINDEX, TROPONINI in the last 168 hours. BNP: Invalid input(s): POCBNP CBG: Recent Labs  Lab 12/06/19 0738 12/06/19 1131 12/06/19 1654 12/06/19 2133 12/07/19 0758  GLUCAP 92 135* 115* 108* 107*   D-Dimer No results for input(s): DDIMER in the last 72 hours. Hgb A1c No results for input(s): HGBA1C in the last 72 hours. Lipid Profile No results for input(s): CHOL, HDL, LDLCALC, TRIG, CHOLHDL, LDLDIRECT in the last 72 hours. Thyroid function studies No results for input(s): TSH, T4TOTAL, T3FREE, THYROIDAB in the last 72 hours.  Invalid input(s): FREET3 Anemia work up No results for input(s): VITAMINB12, FOLATE, FERRITIN, TIBC, IRON, RETICCTPCT in the last 72 hours. Urinalysis    Component Value Date/Time   COLORURINE YELLOW (A) 12/03/2019 2110   APPEARANCEUR CLEAR (A) 12/03/2019 2110   APPEARANCEUR Clear 04/28/2019 1130   LABSPEC 1.005 12/03/2019 2110   LABSPEC 1.028 01/15/2014 1706   PHURINE 7.0 12/03/2019 2110   GLUCOSEU NEGATIVE 12/03/2019 2110   GLUCOSEU >=500 01/15/2014 1706   HGBUR NEGATIVE 12/03/2019 2110   BILIRUBINUR NEGATIVE  12/03/2019 2110   BILIRUBINUR Negative 04/28/2019 1130   BILIRUBINUR Negative 01/15/2014 [place] 12/03/2019 2110   PROTEINUR 100 (A) 12/03/2019 2110   NITRITE NEGATIVE 12/03/2019 2110   LEUKOCYTESUR NEGATIVE 12/03/2019 2110   LEUKOCYTESUR Negative 01/15/2014 1706   Sepsis Labs Invalid input(s): PROCALCITONIN,  WBC,  LACTICIDVEN Microbiology Recent Results (from the past 240 hour(s))  Respiratory Panel by RT PCR (Flu A&B, Covid) - Nasopharyngeal Swab     Status: None    Collection Time: 12/03/19  9:10 PM   Specimen: Nasopharyngeal Swab  Result Value Ref Range Status   SARS Coronavirus 2 by RT PCR NEGATIVE NEGATIVE Final    Comment: (NOTE) SARS-CoV-2 target nucleic acids are NOT DETECTED. The SARS-CoV-2 RNA is generally detectable in upper respiratoy specimens during the acute phase of infection. The lowest concentration of SARS-CoV-2 viral copies this assay can detect is 131 copies/mL. A negative result does not preclude SARS-Cov-2 infection and should not be used as the sole basis for treatment or other patient management decisions. A negative result may occur with  improper specimen collection/handling, submission of specimen other than nasopharyngeal swab, presence of viral mutation(s) within the areas targeted by this assay, and inadequate number of viral copies (<131 copies/mL). A negative result must be combined with clinical observations, patient history, and epidemiological information. The expected result is Negative. Fact Sheet for Patients:  PinkCheek.be Fact Sheet for Healthcare Providers:  GravelBags.it This test is not yet ap proved or cleared by the [place] FDA and  has been authorized for detection and/or diagnosis of SARS-CoV-2 by FDA under an Emergency Use Authorization (EUA). This EUA will remain  in effect (meaning this test can be used) for the duration of the COVID-19 declaration under Section 564(b)(1) of the Act, 21 U.S.C. section 360bbb-3(b)(1), unless the authorization is terminated or revoked sooner.    Influenza A by PCR NEGATIVE NEGATIVE Final   Influenza B by PCR NEGATIVE NEGATIVE Final    Comment: (NOTE) The Xpert Xpress SARS-CoV-2/FLU/RSV assay is intended as an aid in  the diagnosis of influenza from Nasopharyngeal swab specimens and  should not be used as a sole basis for treatment. Nasal washings and  aspirates are unacceptable for Xpert Xpress  SARS-CoV-2/FLU/RSV  testing. Fact Sheet for Patients: PinkCheek.be Fact Sheet for Healthcare Providers: GravelBags.it This test is not yet approved or cleared by the [place] FDA and  has been authorized for detection and/or diagnosis of SARS-CoV-2 by  FDA under an Emergency Use Authorization (EUA). This EUA will remain  in effect (meaning this test can be used) for the duration of the  Covid-19 declaration under Section 564(b)(1) of the Act, 21  U.S.C. section 360bbb-3(b)(1), unless the authorization is  terminated or revoked. Performed at [facility], [address] [place], [place] [address]   Urine culture     Status: Abnormal   Collection Time: 12/03/19  9:10 PM   Specimen: Urine, Random  Result Value Ref Range Status   Specimen Description   Final    URINE, RANDOM Performed at [facility], [address] [place], [place] [address]    Special Requests   Final    NONE Performed at [facility], [address] [place], [place] [address]    Culture (A)  Final    <10,000 COLONIES/mL INSIGNIFICANT GROWTH Performed at [facility], [place] [address] [place], [place] [address]    Report Status 12/05/2019 FINAL  Final  Blood culture (  routine x 2)     Status: None (Preliminary result)   Collection Time: 12/03/19 10:29 PM   Specimen: BLOOD  Result Value Ref Range Status   Specimen Description BLOOD BLOOD LEFT HAND  Final   Special Requests   Final    BOTTLES DRAWN AEROBIC AND ANAEROBIC Blood Culture adequate volume   Culture   Final    NO GROWTH 4 DAYS Performed at [facility], [address] [place], [place] [address]    Report Status PENDING  Incomplete  Blood culture (routine x 2)     Status: None (Preliminary result)   Collection Time: 12/03/19 10:29 PM   Specimen: BLOOD  Result Value Ref Range Status   Specimen Description BLOOD BLOOD RIGHT FOREARM  Final    Special Requests   Final    BOTTLES DRAWN AEROBIC AND ANAEROBIC Blood Culture adequate volume   Culture   Final    NO GROWTH 4 DAYS Performed at [facility], [address], [place], [place] [address]    Report Status PENDING  Incomplete  MRSA PCR Screening     Status: None   Collection Time: 12/04/19  3:22 AM   Specimen: Nasopharyngeal  Result Value Ref Range Status   MRSA by PCR NEGATIVE NEGATIVE Final    Comment:        The GeneXpert MRSA Assay (FDA approved for NASAL specimens only), is one component of a comprehensive MRSA colonization surveillance program. It is not intended to diagnose MRSA infection nor to guide or monitor treatment for MRSA infections. Performed at [facility], [address] [place], [place] [address]      Time coordinating discharge: Over 30 minutes  SIGNED:   Wyvonnia Dusky, MD  [facility] 12/07/2019, 11:08 AM Pager   If 7PM-7AM, please contact night-coverage [email or web address]

## 2019-12-07 NOTE — Progress Notes (Signed)
[facility] Cardiology  SUBJECTIVE: Patient sitting up in chair, reports feeling fatigued, notes mild chest discomfort, without shortness of breath, on O2 by nasal cannula   Vitals:   12/06/19 1130 12/06/19 1953 12/07/19 0254 12/07/19 0442  BP: 132/62 134/70  (!) 150/74  Pulse: 74 70  69  Resp: 16 20  20   Temp: 98.6 F (37 C) 98.5 F (36.9 C)  98.2 F (36.8 C)  TempSrc: Oral Oral  Oral  SpO2: 95% 98%  94%  Weight:   79.9 kg   Height:         Intake/Output Summary (Last 24 hours) at 12/07/2019 0800 Last data filed at 12/06/2019 2300 Gross per 24 hour  Intake 0 ml  Output 2450 ml  Net -2450 ml      PHYSICAL EXAM  General: Well developed, well nourished, in no acute distress HEENT:  Normocephalic and atramatic Neck:  No JVD.  Lungs: Clear bilaterally to auscultation and percussion. Heart: HRRR . Normal S1 and S2 without gallops or murmurs.  Abdomen: Bowel sounds are positive, abdomen soft and non-tender  Msk:  Back normal, normal gait. Normal strength and tone for age. Extremities: No clubbing, cyanosis or edema.   Neuro: Alert and oriented X 3. Psych:  Good affect, responds appropriately   LABS: Basic Metabolic Panel: Recent Labs    12/05/19 0539 12/05/19 0539 12/06/19 0421 12/07/19 0414  NA 142   < > 141 139  K 3.7   < > 4.1 3.8  CL 101   < > 100 98  CO2 32   < > 32 30  GLUCOSE 107*   < > 90 102*  BUN 16   < > 21 27*  CREATININE 0.76   < > 0.69 0.69  CALCIUM 9.0   < > 9.2 9.3  MG 2.1  --   --   --   PHOS 3.7  --   --   --    < > = values in this interval not displayed.   Liver Function Tests: Recent Labs    12/07/19 0414  AST 22  ALT 46*  ALKPHOS 83  BILITOT 0.6  PROT 6.2*  ALBUMIN 3.6   No results for input(s): LIPASE, AMYLASE in the last 72 hours. CBC: Recent Labs    12/06/19 0421 12/07/19 0414  WBC 8.8 9.7  HGB 11.3* 12.1  HCT 36.5 38.1  MCV 87.1 84.3  PLT 118* 125*   Cardiac Enzymes: No results for input(s): CKTOTAL, CKMB, CKMBINDEX,  TROPONINI in the last 72 hours. BNP: Invalid input(s): POCBNP D-Dimer: No results for input(s): DDIMER in the last 72 hours. Hemoglobin A1C: No results for input(s): HGBA1C in the last 72 hours. Fasting Lipid Panel: No results for input(s): CHOL, HDL, LDLCALC, TRIG, CHOLHDL, LDLDIRECT in the last 72 hours. Thyroid Function Tests: No results for input(s): TSH, T4TOTAL, T3FREE, THYROIDAB in the last 72 hours.  Invalid input(s): FREET3 Anemia Panel: No results for input(s): VITAMINB12, FOLATE, FERRITIN, TIBC, IRON, RETICCTPCT in the last 72 hours.  No results found.   Echo LVEF 30 to 35%  TELEMETRY: Sinus rhythm:  ASSESSMENT AND PLAN:  Active Problems:   Acute respiratory failure with hypoxia and hypercarbia (HCC)    1.  Respiratory failure, multifactorial, primarily due to underlying COPD with ongoing tobacco abuse, with element of acute systolic congestive heart failure, slowly improving, on O2 by nasal cannula 2.  Acute systolic congestive heart failure, LVEF 30 to 35%, BNP 777 3.  Coronary artery disease,  known three-vessel coronary artery disease, chronically occluded proximal left circumflex and distal RCA by cardiac catheterization 11/26/2016, with borderline elevated high-sensitivity troponin 11 and 32, very likely due to demand supply ischemia, with nondiagnostic ECG 4.  Ventricular tachycardia, in the setting of respiratory failure post intubation, without ECG or telemetry strips for review, was on amiodarone drip, converted to p.o. amiodarone, without recurrence 5.  COPD exacerbation, with ongoing tobacco abuse  Recommendations  1.  Agree with overall current therapy 2.  Continue diuresis 3.  Carefully monitor renal status 4.  Continue amiodarone 200 mg p.o. twice daily for now with plan to reduce to 200 mg daily as outpatient with Dr. Clayborn Bigness in follow-up 5.  Continue carvedilol 12.5 mg twice daily 6.  Continue lisinopril 5 mg daily 7.  Probable discharge in  a.m. 8.  Follow-up with Dr. Clayborn Bigness in 1 week  Sign off for now, please call if any questions   Isaias Cowman, MD, PhD, [facility] 12/07/2019 8:00 AM

## 2019-12-08 ENCOUNTER — Telehealth: Payer: Self-pay | Admitting: Family

## 2019-12-08 NOTE — Telephone Encounter (Signed)
Spoke with patient who said she is doing ok since she got back home from hospital. She Is still checking her weight daily and following a low sodium diet. She is walking around some but not getting much exercise and is having some chest pain but otherwise no symptoms. She confirmed a virtual follow up visit on 4/19 with the [facility].   Alyse Low, [place]

## 2019-12-10 IMAGING — CT CT ANGIO CHEST-ABD-PELV FOR DISSECTION W/ AND WO/W CM
2 of 7 series · 13 of 46 positions shown, 15 images · IV contrast (APPLIED)
Comparison: 08/19/2017

CLINICAL DATA: Central chest pain for several hours

EXAM:
CT ANGIOGRAPHY CHEST, ABDOMEN AND PELVIS
TECHNIQUE: Multidetector CT imaging through the chest, abdomen and pelvis was
performed using the standard protocol during bolus administration of
intravenous contrast. Multiplanar reconstructed images and MIPs were
obtained and reviewed to evaluate the vascular anatomy.
CONTRAST:  100mL OMNIPAQUE IOHEXOL 350 MG/ML SOLN

[Series 5: axial arterial · axial · arterial · 0.71mm/px · z∈[-1193,-629]mm · 10 of 216 slices shown, 12 images]
[im 14/216  soft-tissue]
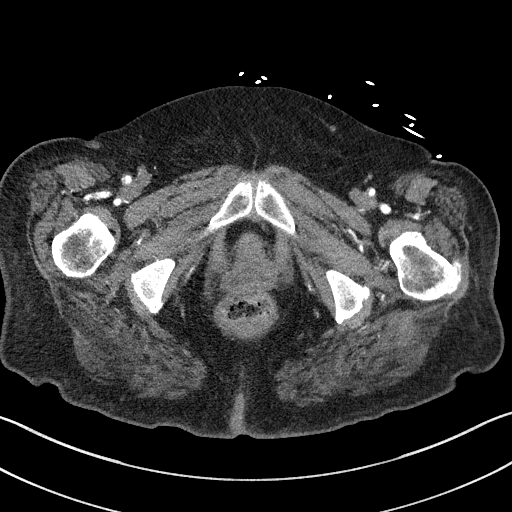
[im 14/216  bone]
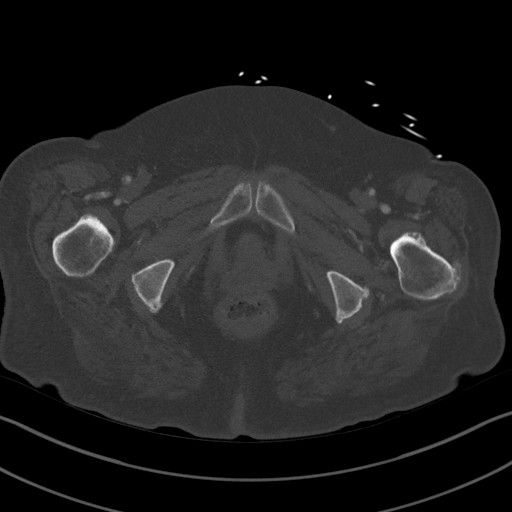
[im 41/216  soft-tissue]
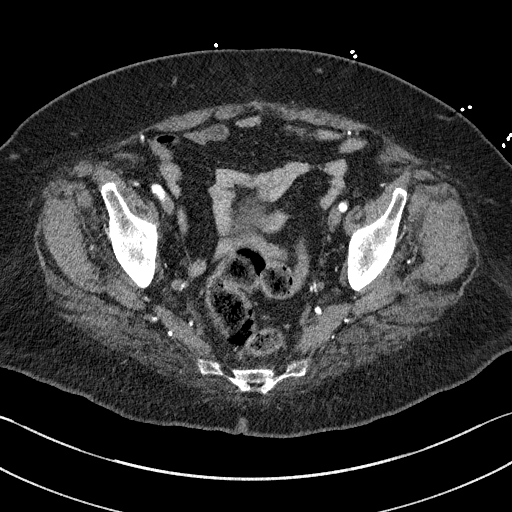
[im 54/216  soft-tissue]
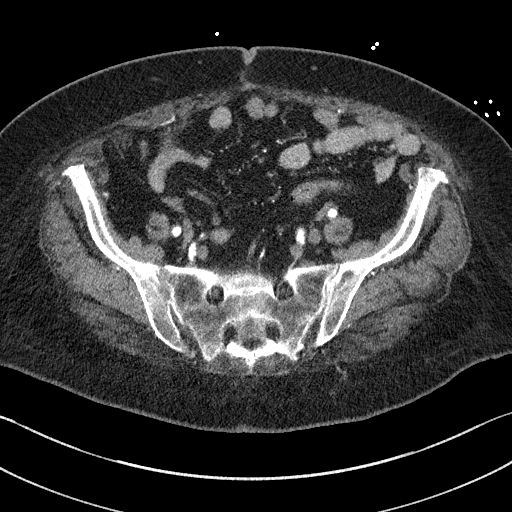
[im 81/216  soft-tissue]
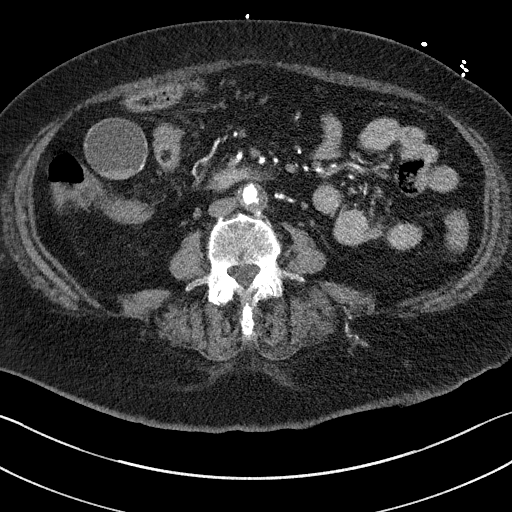
[im 95/216  soft-tissue]
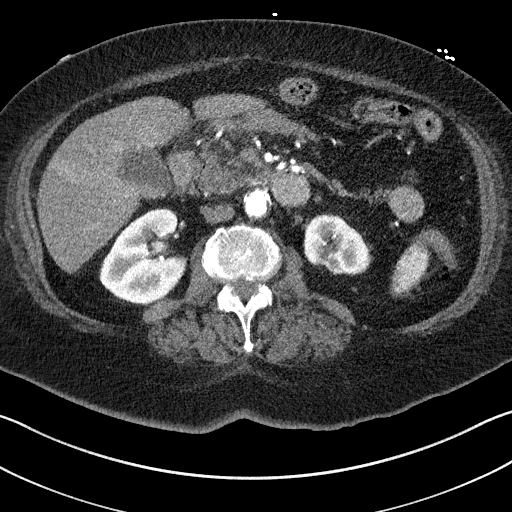
[im 121/216  soft-tissue]
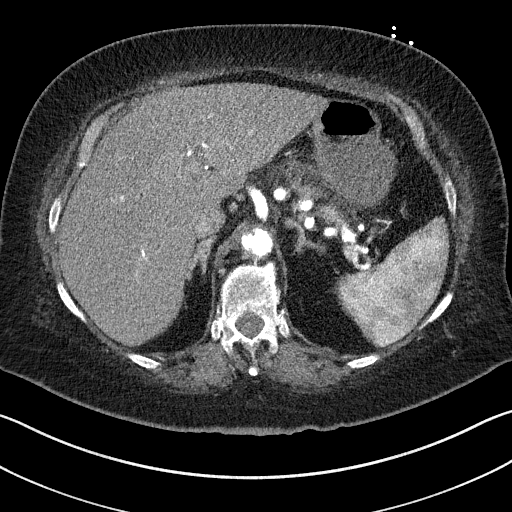
[im 135/216  soft-tissue]
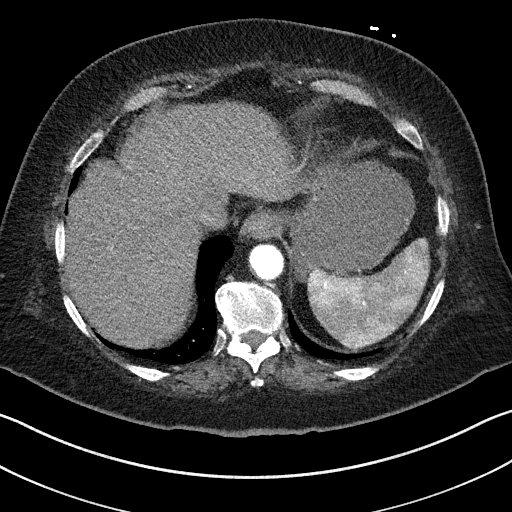
[im 162/216  soft-tissue]
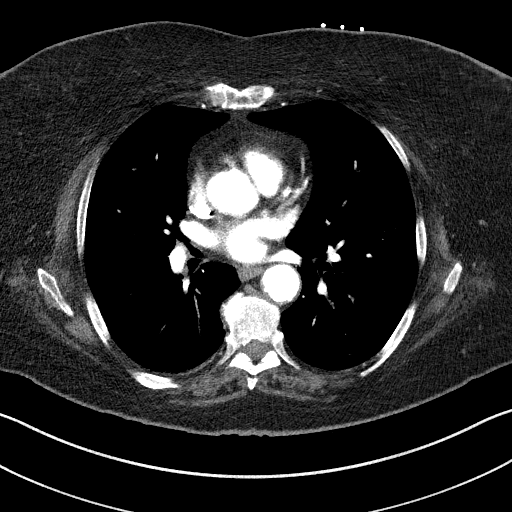
[im 175/216  soft-tissue]
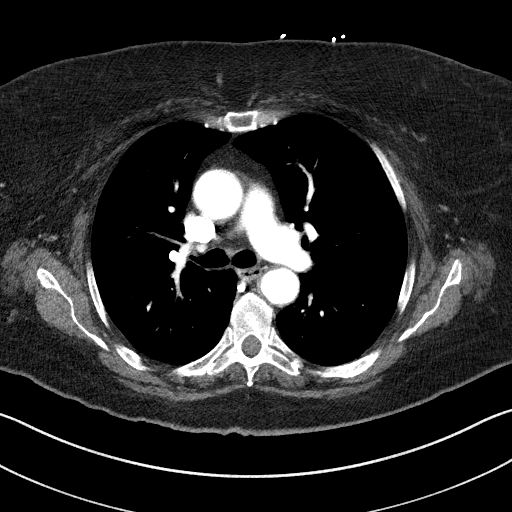
[im 175/216  bone]
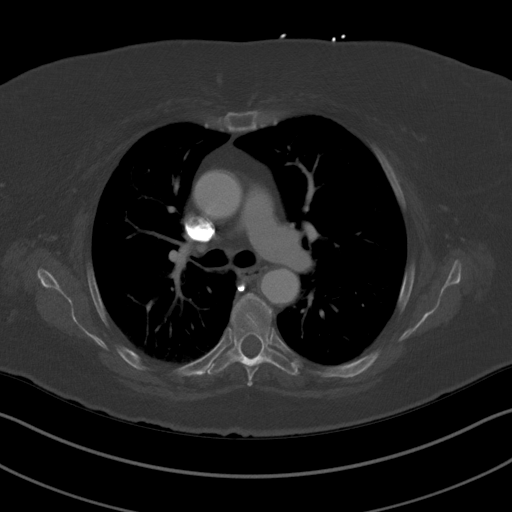
[im 202/216  soft-tissue]
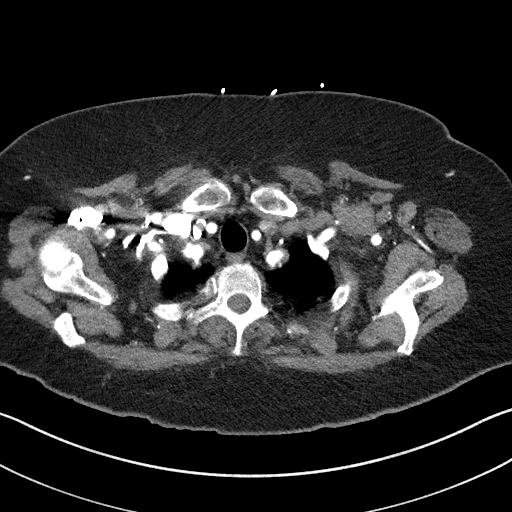

[Series 7: coronals · coronal · 0.92mm/px · 3 of 153 slices shown]
[im 39/153  soft-tissue]
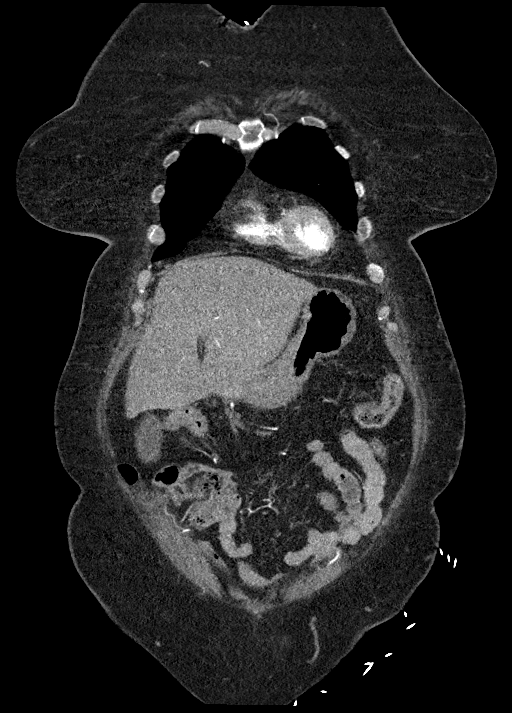
[im 77/153  soft-tissue]
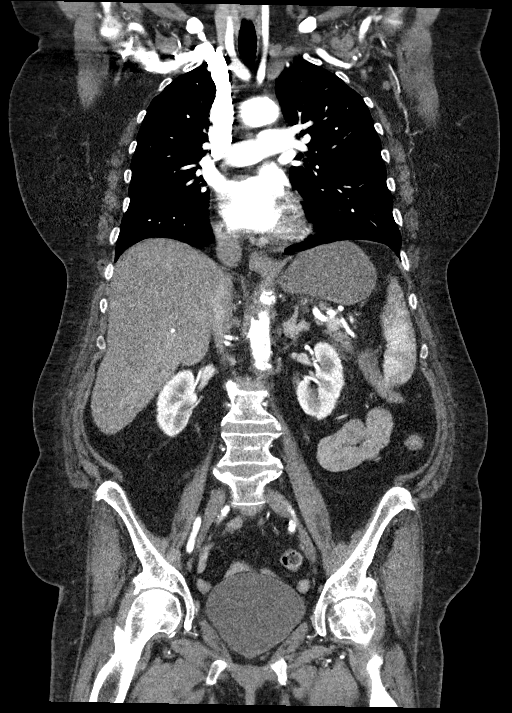
[im 115/153  soft-tissue]
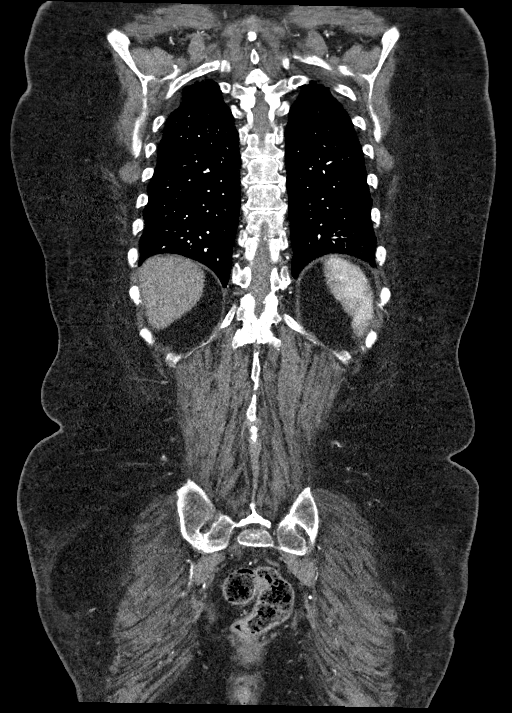

[13 of 46 positions shown; findings below may reference images not displayed]

FINDINGS: CTA CHEST FINDINGS

Cardiovascular: Precontrast images demonstrate no hyperdense
crescent within the thoracic aorta. Thoracic aorta demonstrates
atherosclerotic calcifications without aneurysmal dilatation. Mild
coronary calcifications are seen. No cardiac enlargement is noted.
Mildly ulcerated plaque is noted within the descending thoracic
aorta. No dissection is identified. The pulmonary artery as
visualized shows no large central embolus.

Mediastinum/Nodes: Thoracic inlet is within normal limits. No hilar
or mediastinal adenopathy is noted. The esophagus is within normal
limits.

Lungs/Pleura: Lungs are well aerated bilaterally with diffuse
emphysematous changes. No focal infiltrate or sizable effusion is
seen. No sizable parenchymal nodule is noted.

Musculoskeletal: Degenerative changes of the thoracic spine are
noted. Old rib fractures are noted on the left.

Review of the MIP images confirms the above findings.

CTA ABDOMEN AND PELVIS FINDINGS

VASCULAR

Aorta: Abdominal aorta demonstrates calcified and noncalcified
atherosclerotic plaque throughout its course. Focal intimal flap is
noted in the infrarenal aorta consistent with chronic dissection.
Ulcerative plaque is noted as well. No occlusive changes are seen.
No aneurysmal dilatation is seen.

Celiac: Celiac axis demonstrates both calcified and noncalcified
plaque at its origin with focal stenosis of approximately 50%.

SMA: Patent without evidence of aneurysm, dissection, vasculitis or
significant stenosis.

Renals: Mild atherosclerotic changes are noted bilaterally. Early
bifurcation is noted on the right. Single renal arteries are noted
bilaterally.

IMA: Patent without evidence of aneurysm, dissection, vasculitis or
significant stenosis.

Iliacs: Atherosclerotic changes are noted without focal stenosis.

Veins: No venous abnormality is noted.

Review of the MIP images confirms the above findings.

NON-VASCULAR

Hepatobiliary: Liver is well visualized within normal limits. The
gallbladder is well distended. Dependent density is noted likely
related to small stones or gallbladder sludge. This is stable from
the prior exam.

Pancreas: Pancreas is well visualized and demonstrates mild
peripancreatic inflammatory change consistent with mild acute
pancreatitis. No pseudocyst is seen. No changes to suggest
pancreatic necrosis are noted at this time.

Spleen: Within normal limits.

Adrenals/Urinary Tract: Adrenal glands are unremarkable. Kidneys are
well visualized bilaterally within normal enhancement pattern. Some
scarring is noted in the left kidney in the lower pole stable from
the prior exam. Previously seen left UPJ stone is no longer
identified. The bladder is well distended.

Stomach/Bowel: Colon is decompressed. The appendix is well
visualized and within normal limits. No small bowel or gastric
abnormality is seen.

Lymphatic: No significant lymphadenopathy is noted.

Reproductive: Uterus has been surgically removed. No adnexal mass is
noted.

Other: No abdominal wall hernia or abnormality. No abdominopelvic
ascites.

Musculoskeletal: Degenerative changes of lumbar spine are seen.
Chronic superior endplate L5 compression deformity is noted.

Review of the MIP images confirms the above findings.
IMPRESSION: CTA of the chest: No evidence of dissection is identified. No
aneurysmal dilatation is seen.

Atherosclerotic plaque with mild ulcerations are noted particularly
in the descending thoracic aorta.

Emphysematous changes without acute parenchymal abnormality.

CTA of the abdomen and pelvis: Changes consistent with mild acute
pancreatitis. No pseudocyst is identified.

Significant calcified and noncalcified atherosclerotic plaque within
the abdominal aorta. No aneurysmal dilatation is seen.

50% stenosis of the proximal celiac axis.

Dependent density within the gallbladder consistent with small
stones or gallbladder sludge.

Previously seen left UPJ stone is no longer identified.

## 2019-12-11 ENCOUNTER — Telehealth: Payer: Self-pay

## 2019-12-11 ENCOUNTER — Ambulatory Visit: Payer: Self-pay | Admitting: General Practice

## 2019-12-11 DIAGNOSIS — E782 Mixed hyperlipidemia: Secondary | ICD-10-CM

## 2019-12-11 DIAGNOSIS — J449 Chronic obstructive pulmonary disease, unspecified: Secondary | ICD-10-CM

## 2019-12-11 DIAGNOSIS — F321 Major depressive disorder, single episode, moderate: Secondary | ICD-10-CM

## 2019-12-11 DIAGNOSIS — N183 Chronic kidney disease, stage 3 unspecified: Secondary | ICD-10-CM

## 2019-12-11 DIAGNOSIS — F419 Anxiety disorder, unspecified: Secondary | ICD-10-CM

## 2019-12-11 DIAGNOSIS — I5021 Acute systolic (congestive) heart failure: Secondary | ICD-10-CM

## 2019-12-11 DIAGNOSIS — I2510 Atherosclerotic heart disease of native coronary artery without angina pectoris: Secondary | ICD-10-CM

## 2019-12-11 NOTE — Patient Instructions (Signed)
Visit Information  Goals Addressed            This Visit's Progress   . RNCM: Pt-"They said it happened because of the fluid around my heart" (pt-stated)       CARE PLAN ENTRY (see longtitudinal plan of care for additional care plan information)  Current Barriers:  . Chronic Disease Management support, education, and care coordination needs related to CHF, CAD, HLD, COPD, DMII, Anxiety, and Depression  Clinical Goal(s) related to CHF, CAD, HLD, COPD, DMII, Anxiety, and Depression:  Over the next 120 days, patient will:  . Work with the care management team to address educational, disease management, and care coordination needs  . Begin or continue self health monitoring activities as directed today Measure and record cbg (blood glucose) 3 times weekly or more if taking steroids, Measure and record blood pressure 5 times per week, Measure and record weight daily, and adhere to a heart healthy/ADA diet . Call provider office for new or worsened signs and symptoms Blood glucose findings outside established parameters, Blood pressure findings outside established parameters, Weight outside established parameters, Oxygen saturation lower than established parameter, Chest pain, Shortness of breath, and New or worsened symptom related to depression and other chronic conditions . Call care management team with questions or concerns . Verbalize basic understanding of patient centered plan of care established today  Interventions related to CHF, CAD, HLD, COPD, DMII, Anxiety, and Depression:  . Evaluation of current treatment plans and patient's adherence to plan as established by provider.  The patient is post discharge from the hospital after passing out at home.  The patient does not remember anything but being in the bathroom and hearing sirens. The patient verbalized they did chest compressions on her and she is sore but she does not feel any ribs are broken. The patient was intubated but did not  have to stay on the ventilator long. The patient states she is doing well since coming home from the hospital.  . Assessed patient understanding of disease states.  The patient has a good understanding of her chronic conditions. The patient states she did not have changes in her weight at home. Review of her normal heart failure signs and symptoms. The patient states her weight in the hospital was 182 but now at home she is 174.  Review of events prior to her episode and the patient verbalized she had left over ham from Easter. She says this may have been the reason she had fluid overload.  . Assessed patient's education and care coordination needs.  The patient did have questions about certain medications. Amiodarone and Losartan. Education given. Complete medication review given. Will do a referral for pharmacy support for additional questions and concerns. Will collaborate with the pcp and CCM pharmacist in relation to the need for zofran and NTG sl refills. The patient verbalized she uses pill packs that work very well for her and helps her keep things straight.  . Provided disease specific education to patient.  Education on watching sodium content of foods. The patient verbalized she is doing a better job at this. The patient has only been checking her blood sugars every 3 days. Education on the prednisone she is taking will cause elevation in blood sugars. The patient checked her blood sugar while on the phone with the [facility] and the reading was 150.  Education on s/s of hyperglycemia and checking more frequently if she feels differently.  The patient blood pressure is stable. She is  weighing daily and also she is taking her blood pressure regularly and recording.  Nash Dimmer with appropriate clinical care team members regarding patient needs.  Will refer LCSW for assistance with stressors and ways to relieve anxiety to reduce the incidence of smoking.  On going support and education from pharmacy for  questions/concerns related to new medications. Will continue to monitor for changes.  Marland Kitchen Post discharge follow up with specialist on 4/16 at 0930, HF clinic virtual visit on 4-19 and post discharge follow up with pcp on 4/19 at 230pm . CCM team numbers and roles discussed with the patient and she agrees to work with the team.   Patient Self Care Activities related to CHF, CAD, HLD, COPD, DMII, Anxiety, and Depression:  . Patient is unable to independently self-manage chronic health conditions  Initial goal documentation        Ms. Marczak was given information about Chronic Care Management services today including:  1. CCM service includes personalized support from designated clinical staff supervised by her physician, including individualized plan of care and coordination with other care providers 2. 24/7 contact phone numbers for assistance for urgent and routine care needs. 3. Service will only be billed when office clinical staff spend 20 minutes or more in a month to coordinate care. 4. Only one practitioner may furnish and bill the service in a calendar month. 5. The patient may stop CCM services at any time (effective at the end of the month) by phone call to the office staff. 6. The patient will be responsible for cost sharing (co-pay) of up to 20% of the service fee (after annual deductible is met).  Patient agreed to services and verbal consent obtained.   Patient verbalizes understanding of instructions provided today.   The care management team will reach out to the patient again over the next 30 to 60 days.   Noreene Larsson RN, MSN, [facility] Mobile: [phone]

## 2019-12-11 NOTE — Chronic Care Management (AMB) (Signed)
Chronic Care Management   Initial Visit Note  12/11/2019 Name: Sarah White MRN: [id number] DOB: February 20, 1946  Referred by: Valerie Roys, DO Reason for referral : Chronic Care Management (Initial and post discharge hospital: RNCM chronic disease management and care coordination needs. )   Sarah White is a 74 y.o. year old female who is a primary care patient of Valerie Roys, DO. The CCM team was consulted for assistance with chronic disease management and care coordination needs related to CHF, CAD, HLD, COPD, DMII, Anxiety and Depression  Review of patient status, including review of consultants reports, relevant laboratory and other test results, and collaboration with appropriate care team members and the patient's provider was performed as part of comprehensive patient evaluation and provision of chronic care management services.    SDOH (Social Determinants of Health) assessments performed: Yes See Care Plan activities for detailed interventions related to SDOH  SDOH Interventions     Most Recent Value  SDOH Interventions  SDOH Interventions for the Following Domains  Tobacco, Physical Activity, Social Connections  Financial Strain Interventions  Other (Comment) [says that she is doing okay. Her son helps her out a lot]  Physical Activity Interventions  Other (Comments) [the patient is limited in what she can do physically because of chronic conditions. High fall risk, uses a walker when ambulating.]  Social Connections Interventions  Other (Comment) [good support system.]  Tobacco Interventions  Other (Comment) [The patient has stopped several times but picks it back up. This is what is her stress reliever.]       Medications: Outpatient Encounter Medications as of 12/11/2019  Medication Sig Note   albuterol (VENTOLIN HFA) 108 (90 Base) MCG/ACT inhaler Inhale 2 puffs into the lungs every 6 (six) hours as needed for wheezing or shortness of breath.    amiodarone  (PACERONE) 200 MG tablet Take 1 tablet (200 mg total) by mouth every 12 (twelve) hours.    aspirin EC 81 MG tablet Take 81 mg by mouth at bedtime.     budesonide-formoterol (SYMBICORT) 160-4.5 MCG/ACT inhaler Inhale 2 puffs into the lungs 2 (two) times daily. 12/04/2019: Patient receives direct from manufacturer   carvedilol (COREG) 12.5 MG tablet TAKE 2 TABLETS BY MOUTH TWICE DAILY WITH A MEAL (Patient taking differently: Take 25 mg by mouth 2 (two) times daily with a meal. )    cyclobenzaprine (FLEXERIL) 10 MG tablet Take 1 tablet (10 mg total) by mouth 3 (three) times daily as needed for muscle spasms. DO NOT DRIVE ON THIS MEDICINE- WILL MAKE YOU SLEEPY    Dulaglutide (TRULICITY) 1.5 WT/8.8EK SOPN Inject 1.5 mg into the skin once a week. 12/04/2019: Patient receives direct from manufacturer    furosemide (LASIX) 40 MG tablet Take 1 tablet (40 mg total) by mouth daily.    isosorbide mononitrate (IMDUR) 60 MG 24 hr tablet Take 1 tablet (60 mg total) by mouth daily.    losartan (COZAAR) 25 MG tablet Take 1 tablet (25 mg total) by mouth daily.    Multiple Vitamin (MULTIVITAMIN WITH MINERALS) TABS tablet Take 1 tablet by mouth daily.    predniSONE (DELTASONE) 20 MG tablet Take 1 tablet (20 mg total) by mouth daily with breakfast for 5 days.    rosuvastatin (CRESTOR) 20 MG tablet Take 1 tablet (20 mg total) by mouth daily.    sertraline (ZOLOFT) 100 MG tablet Take 2 tablets (200 mg total) by mouth daily.    tiotropium (SPIRIVA HANDIHALER) 18 MCG inhalation capsule Place  1 capsule (18 mcg total) into inhaler and inhale daily.    traZODone (DESYREL) 50 MG tablet TAKE 1/2 TO 1 (ONE-HALF TO ONE) TABLET BY MOUTH AT BEDTIME AS NEEDED FOR SLEEP (Patient taking differently: Take 25-50 mg by mouth at bedtime as needed for sleep. )    triamcinolone ointment (KENALOG) 0.5 % Apply 1 application topically 2 (two) times daily.    ondansetron (ZOFRAN-ODT) 4 MG disintegrating tablet Take 4 mg by mouth every  8 (eight) hours as needed for nausea or vomiting. 12/11/2019: Needs a refill on zofran   No facility-administered encounter medications on file as of 12/11/2019.     Objective:  BP Readings from Last 3 Encounters:  12/11/19 130/78  12/07/19 (!) 150/74  10/18/19 124/71   Lab Results  Component Value Date   HGBA1C 6.1 (H) 12/04/2019   Wt Readings from Last 3 Encounters:  12/11/19 174 lb (78.9 kg)  12/07/19 176 lb 2.4 oz (79.9 kg)  10/18/19 180 lb (81.6 kg)    Goals Addressed            This Visit's Progress    RNCM: Pt-"They said it happened because of the fluid around my heart" (pt-stated)       CARE PLAN ENTRY (see longtitudinal plan of care for additional care plan information)  Current Barriers:   Chronic Disease Management support, education, and care coordination needs related to CHF, CAD, HLD, COPD, DMII, Anxiety, and Depression  Clinical Goal(s) related to CHF, CAD, HLD, COPD, DMII, Anxiety, and Depression:  Over the next 120 days, patient will:   Work with the care management team to address educational, disease management, and care coordination needs   Begin or continue self health monitoring activities as directed today Measure and record cbg (blood glucose) 3 times weekly or more if taking steroids, Measure and record blood pressure 5 times per week, Measure and record weight daily, and adhere to a heart healthy/ADA diet  Call provider office for new or worsened signs and symptoms Blood glucose findings outside established parameters, Blood pressure findings outside established parameters, Weight outside established parameters, Oxygen saturation lower than established parameter, Chest pain, Shortness of breath, and New or worsened symptom related to depression and other chronic conditions  Call care management team with questions or concerns  Verbalize basic understanding of patient centered plan of care established today  Interventions related to CHF, CAD,  HLD, COPD, DMII, Anxiety, and Depression:   Evaluation of current treatment plans and patient's adherence to plan as established by provider.  The patient is post discharge from the hospital after passing out at home.  The patient does not remember anything but being in the bathroom and hearing sirens. The patient verbalized they did chest compressions on her and she is sore but she does not feel any ribs are broken. The patient was intubated but did not have to stay on the ventilator long. The patient states she is doing well since coming home from the hospital.   Assessed patient understanding of disease states.  The patient has a good understanding of her chronic conditions. The patient states she did not have changes in her weight at home. Review of her normal heart failure signs and symptoms. The patient states her weight in the hospital was 182 but now at home she is 174.  Review of events prior to her episode and the patient verbalized she had left over ham from Easter. She says this may have been the reason she had fluid  overload.   Assessed patient's education and care coordination needs.  The patient did have questions about certain medications. Amiodarone and Losartan. Education given. Complete medication review given. Will do a referral for pharmacy support for additional questions and concerns. Will collaborate with the pcp and CCM pharmacist in relation to the need for zofran and NTG sl refills. The patient verbalized she uses pill packs that work very well for her and helps her keep things straight.   Provided disease specific education to patient.  Education on watching sodium content of foods. The patient verbalized she is doing a better job at this. The patient has only been checking her blood sugars every 3 days. Education on the prednisone she is taking will cause elevation in blood sugars. The patient checked her blood sugar while on the phone with the [facility] and the reading was 150.   Education on s/s of hyperglycemia and checking more frequently if she feels differently.  The patient blood pressure is stable. She is weighing daily and also she is taking her blood pressure regularly and recording.   Collaborated with appropriate clinical care team members regarding patient needs.  Will refer LCSW for assistance with stressors and ways to relieve anxiety to reduce the incidence of smoking.  On going support and education from pharmacy for questions/concerns related to new medications. Will continue to monitor for changes.   Post discharge follow up with specialist on 4/16 at 0930, HF clinic virtual visit on 4-19 and post discharge follow up with pcp on 4/19 at 230pm  CCM team numbers and roles discussed with the patient and she agrees to work with the team.   Patient Self Care Activities related to CHF, CAD, HLD, COPD, DMII, Anxiety, and Depression:   Patient is unable to independently self-manage chronic health conditions  Initial goal documentation         Ms. Vlachos was given information about Chronic Care Management services today including:  1. CCM service includes personalized support from designated clinical staff supervised by her physician, including individualized plan of care and coordination with other care providers 2. 24/7 contact phone numbers for assistance for urgent and routine care needs. 3. Service will only be billed when office clinical staff spend 20 minutes or more in a month to coordinate care. 4. Only one practitioner may furnish and bill the service in a calendar month. 5. The patient may stop CCM services at any time (effective at the end of the month) by phone call to the office staff. 6. The patient will be responsible for cost sharing (co-pay) of up to 20% of the service fee (after annual deductible is met).  Patient agreed to services and verbal consent obtained.   Plan:   The care management team will reach out to the patient again over  the next 30 to 60 days.   Noreene Larsson RN, MSN, [facility] Mobile: [phone]

## 2019-12-11 NOTE — Telephone Encounter (Signed)
I have made the 1st attempt to contact the patient or family member in charge, in order to follow up from recently being discharged from the hospital. I left a message on voicemail but I will make another attempt at a different time.  

## 2019-12-13 NOTE — Telephone Encounter (Signed)
Patient is returning a call to [place].  Please call patient back at [phone]

## 2019-12-13 NOTE — Telephone Encounter (Signed)
Transition Care Management Follow-up Telephone Call  Date of discharge and from where: 12/07/2019, [facility]   How have you been since you were released from the hospital? "Just some soreness in back pain and chest from compressions"  Any questions or concerns? No   Items Reviewed:  Did the pt receive and understand the discharge instructions provided? Yes   Medications obtained and verified? Yes   Any new allergies since your discharge? No   Dietary orders reviewed? Yes  Do you have support at home? Yes   Functional Questionnaire: (I = Independent and D = Dependent) ADLs:   Bathing/Dressing- i  Meal Prep- i  Eating- i  Maintaining continence- i  Transferring/Ambulation- i  Managing Meds- i  Follow up appointments reviewed:   PCP Hospital f/u appt confirmed? Yes  Scheduled to see Dr.Johnson on 12/18/2019 @ 230pm .  [facility] f/u appt confirmed? Yes  Scheduled to see Linden Dolin on 12/18/2019 @ 1230 virtually  Are transportation arrangements needed? No   If their condition worsens, is the pt aware to call PCP or go to the Emergency Dept.? Yes  Was the patient provided with contact information for the PCP's office or ED? Yes  Was to pt encouraged to call back with questions or concerns? Yes

## 2019-12-13 NOTE — Telephone Encounter (Signed)
Does pt just need a hosp f/u scheduled?

## 2019-12-14 IMAGING — CR DG CHOLANGIOGRAM OPERATIVE
2 series · 15 of 20 positions shown · non-contrast
Comparison: 09/08/2018

CLINICAL DATA: Cholelithiasis.

EXAM:
INTRAOPERATIVE CHOLANGIOGRAM
TECHNIQUE: Cholangiographic images from the C-arm fluoroscopic device were
submitted for interpretation post-operatively. Please see the
procedural report for the amount of contrast and the fluoroscopy
time utilized.

[Series 3: cont. · 7 of 69 frames shown (1 of 2)]
[frame 1/69]
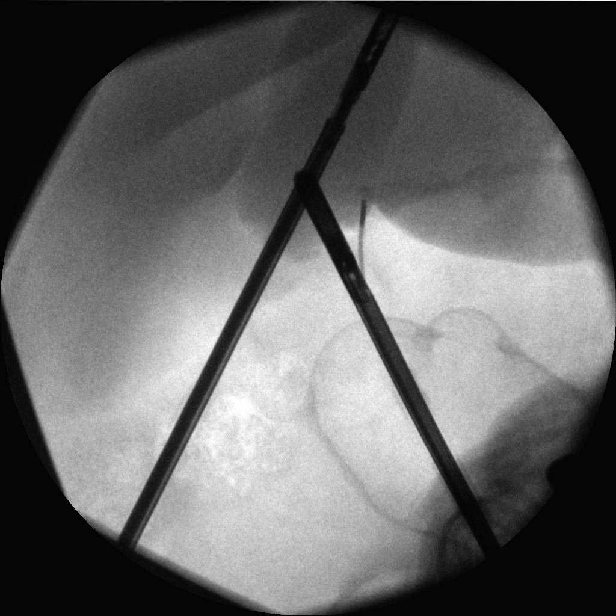
[frame 16/69]
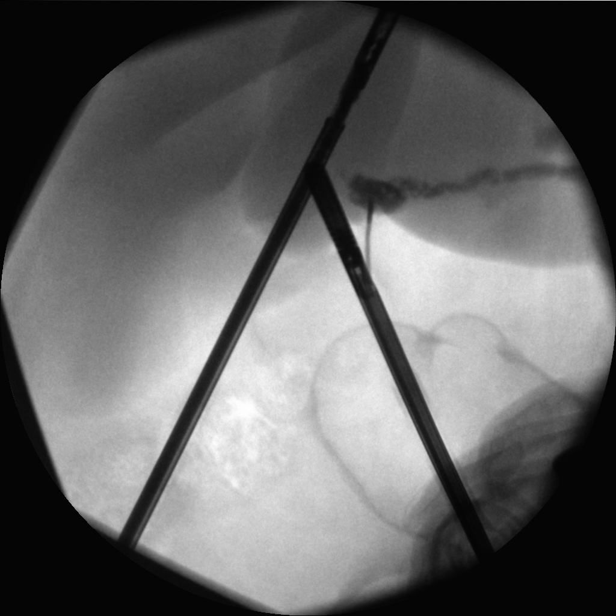
[frame 23/69]
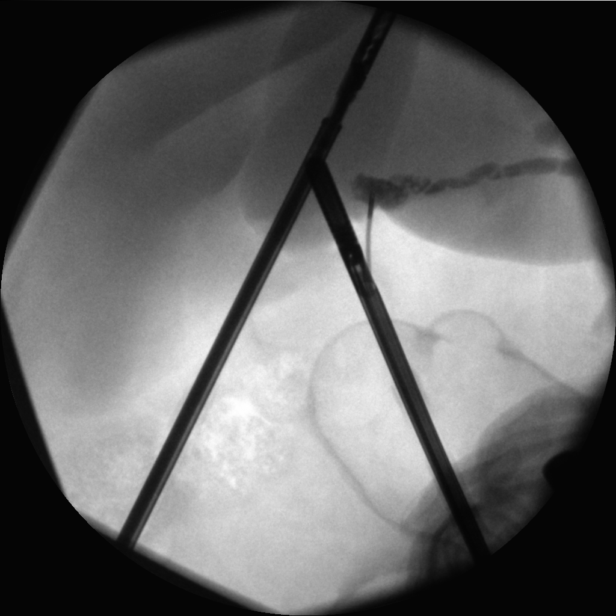
[frame 31/69]
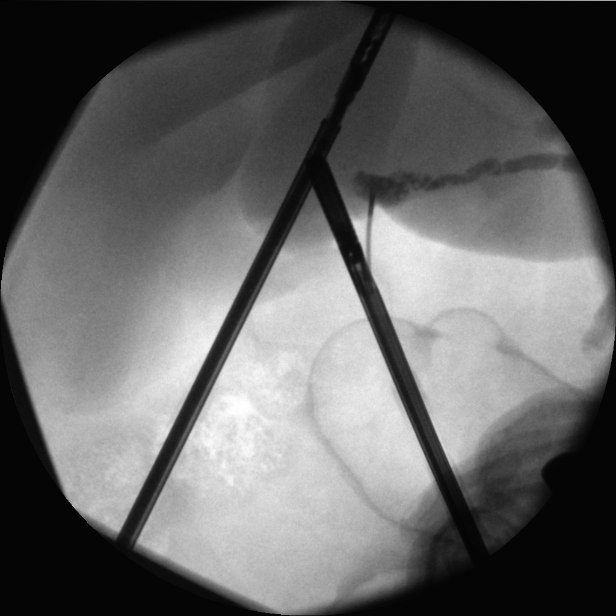
[frame 46/69]
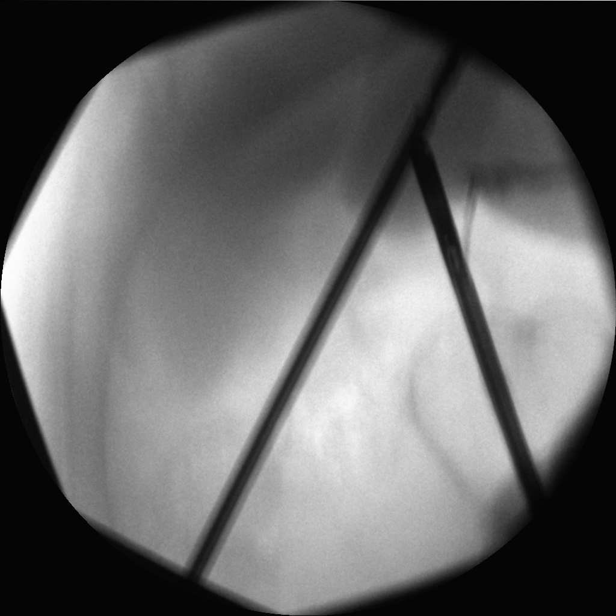
[frame 53/69]
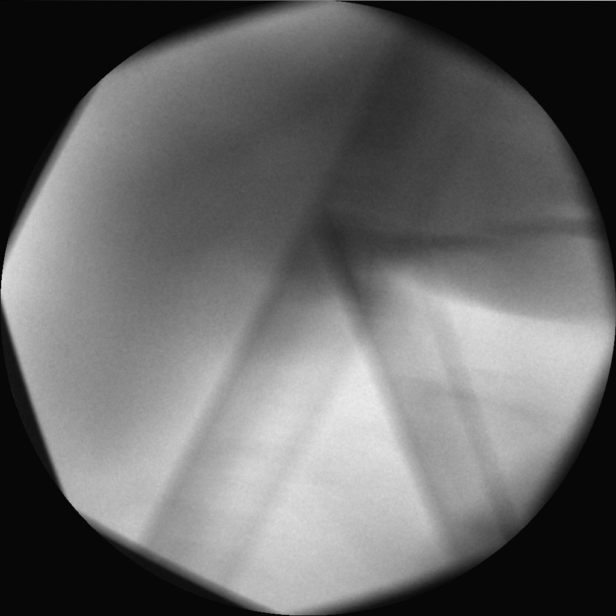
[frame 61/69]
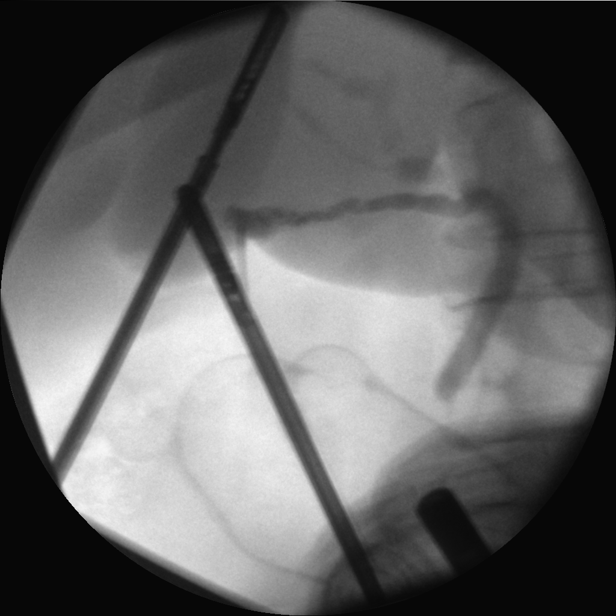

[Series 4: cont. · 8 of 164 frames shown (2 of 2)]
[frame 1/164]
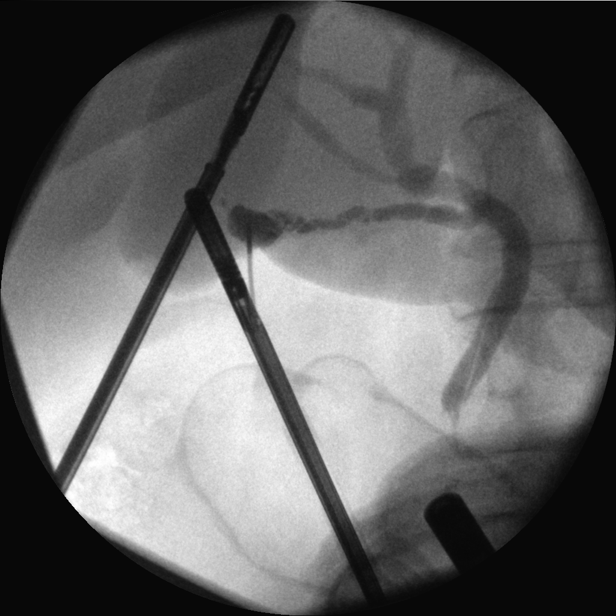
[frame 19/164]
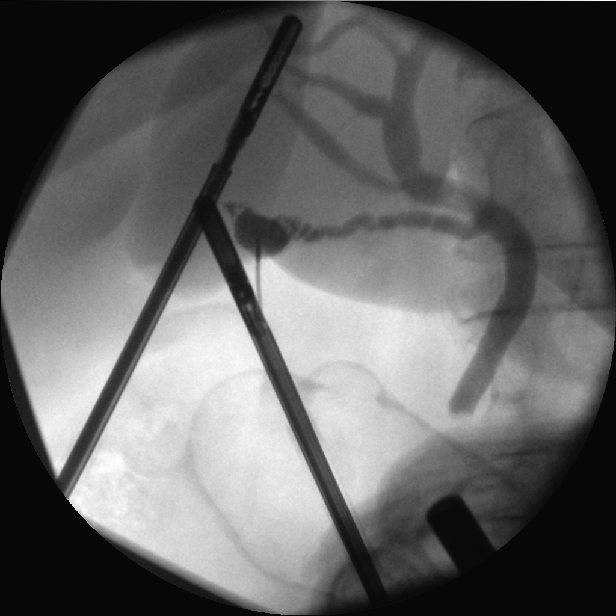
[frame 37/164]
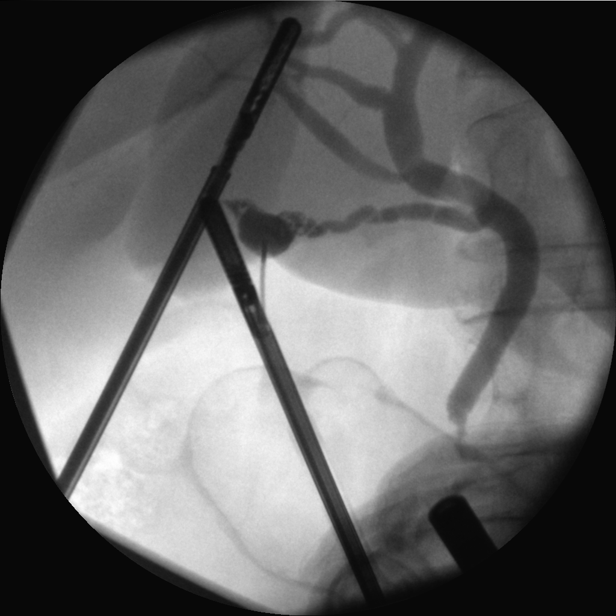
[frame 73/164]
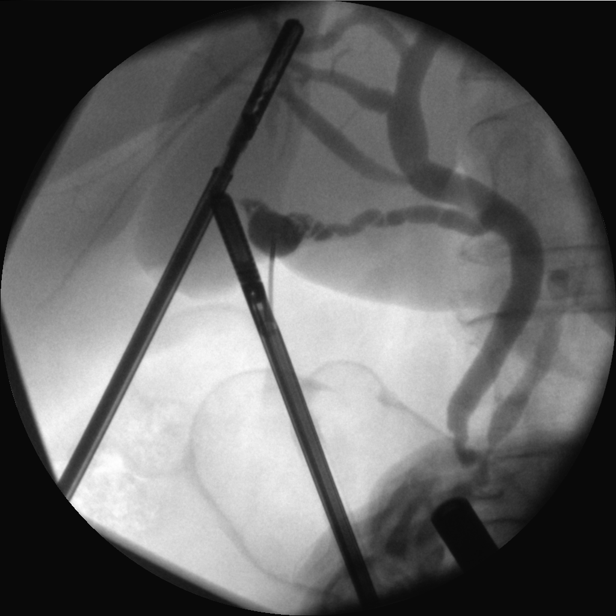
[frame 91/164]
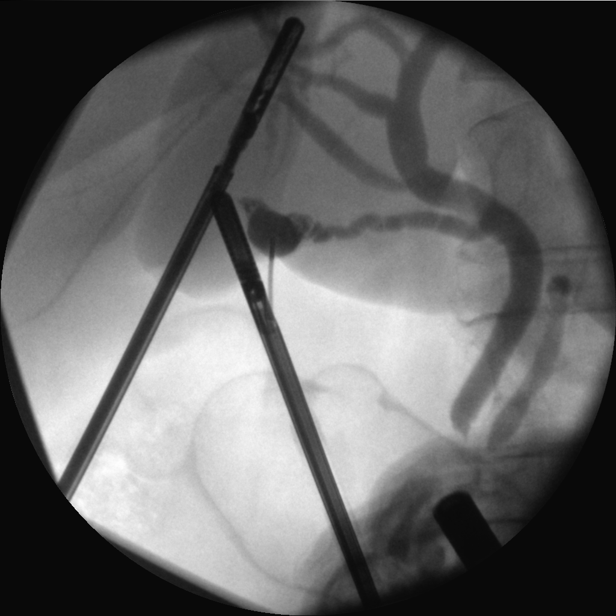
[frame 109/164]
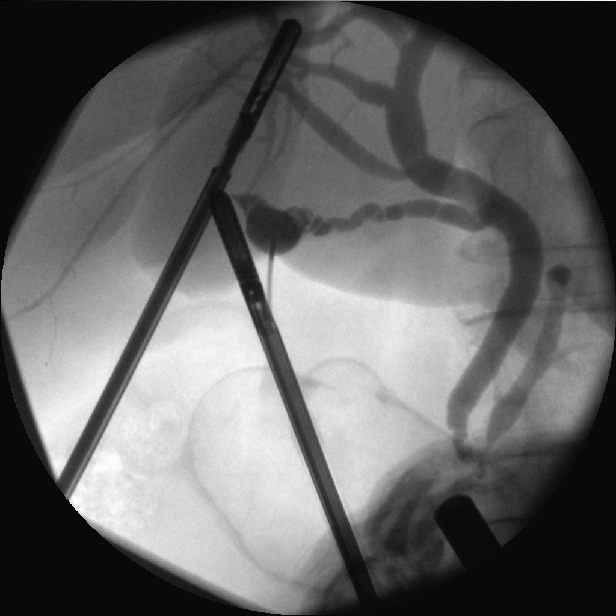
[frame 145/164]
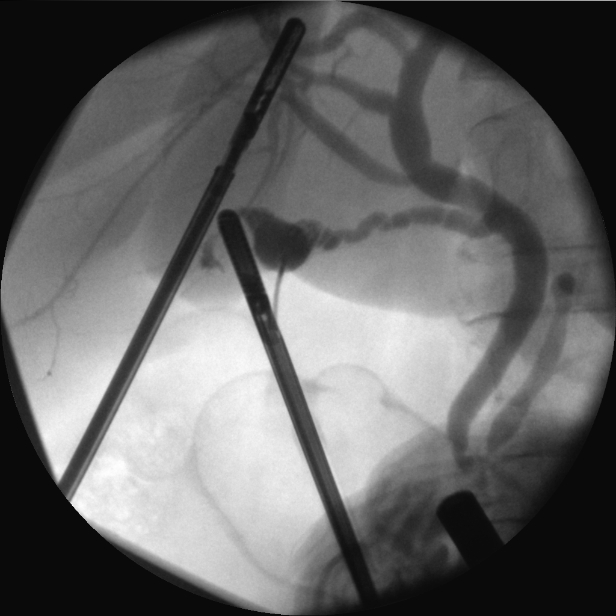
[frame 164/164]
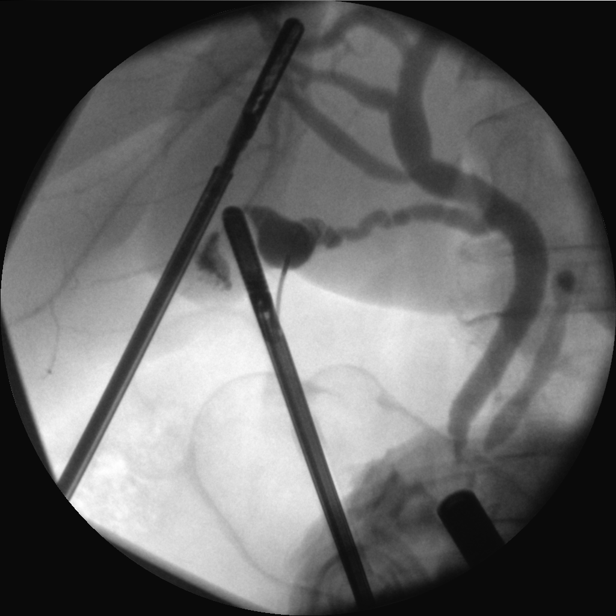

[15 of 20 positions shown; findings below may reference images not displayed]

FINDINGS: Contrast fills the cystic duct and extrahepatic biliary system.
Small amount of reflux into the intrahepatic bile ducts. Contrast
fills the duodenum and refluxes into the main pancreatic duct. No
large filling defects or stones.
IMPRESSION: Patent biliary system without stones.

## 2019-12-15 ENCOUNTER — Telehealth: Payer: Self-pay | Admitting: Family

## 2019-12-15 DIAGNOSIS — J9690 Respiratory failure, unspecified, unspecified whether with hypoxia or hypercapnia: Secondary | ICD-10-CM | POA: Diagnosis not present

## 2019-12-15 DIAGNOSIS — J449 Chronic obstructive pulmonary disease, unspecified: Secondary | ICD-10-CM | POA: Diagnosis not present

## 2019-12-15 DIAGNOSIS — I5021 Acute systolic (congestive) heart failure: Secondary | ICD-10-CM | POA: Diagnosis not present

## 2019-12-15 DIAGNOSIS — I951 Orthostatic hypotension: Secondary | ICD-10-CM | POA: Diagnosis not present

## 2019-12-15 DIAGNOSIS — E119 Type 2 diabetes mellitus without complications: Secondary | ICD-10-CM | POA: Diagnosis not present

## 2019-12-15 DIAGNOSIS — I208 Other forms of angina pectoris: Secondary | ICD-10-CM | POA: Diagnosis not present

## 2019-12-15 DIAGNOSIS — E785 Hyperlipidemia, unspecified: Secondary | ICD-10-CM | POA: Diagnosis not present

## 2019-12-15 DIAGNOSIS — I10 Essential (primary) hypertension: Secondary | ICD-10-CM | POA: Diagnosis not present

## 2019-12-15 DIAGNOSIS — I2510 Atherosclerotic heart disease of native coronary artery without angina pectoris: Secondary | ICD-10-CM | POA: Diagnosis not present

## 2019-12-15 LAB — CULTURE, BLOOD (ROUTINE X 2)
Culture: NO GROWTH
Culture: NO GROWTH
Special Requests: ADEQUATE
Special Requests: ADEQUATE

## 2019-12-15 NOTE — Telephone Encounter (Signed)
Virtual Visit Pre-Appointment Phone Call  Sarah White, I am calling you today to discuss your upcoming appointment. We are currently trying to limit exposure to the virus that causes COVID-19 by seeing patients at home rather than in the office."  1. "What is the BEST phone number to call the day of the visit?" - include this in appointment notes  2. "Do you have or have access to (through a family member/friend) a smartphone with video capability that we can use for your visit?" a. If yes - list this number in appt notes as "cell" (if different from BEST phone #) and list the appointment type as a VIDEO visit in appointment notes b. If no - list the appointment type as a PHONE visit in appointment notes  3. Confirm consent - "In the setting of the current Covid19 crisis, you are scheduled for a (phone or video) visit with your provider on (date) at (time).  Just as we do with many in-office visits, in order for you to participate in this visit, we must obtain consent.  If you'd like, I can send this to your mychart (if signed up) or email for you to review.  Otherwise, I can obtain your verbal consent now.  All virtual visits are billed to your insurance company just like a normal visit would be.  By agreeing to a virtual visit, we'd like you to understand that the technology does not allow for your provider to perform an examination, and thus may limit your provider's ability to fully assess your condition. If your provider identifies any concerns that need to be evaluated in person, we will make arrangements to do so.  Finally, though the technology is pretty good, we cannot assure that it will always work on either your or our end, and in the setting of a video visit, we may have to convert it to a phone-only visit.  In either situation, we cannot ensure that we have a secure connection.  Are you willing to proceed?" STAFF: Did the patient verbally acknowledge consent to telehealth visit? Document  YES/NO here: YES  4. Advise patient to be prepared - "Two hours prior to your appointment, go ahead and check your blood pressure, pulse, oxygen saturation, and your weight (if you have the equipment to check those) and write them all down. When your visit starts, your provider will ask you for this information. If you have an Apple Watch or Kardia device, please plan to have heart rate information ready on the day of your appointment. Please have a pen and paper handy nearby the day of the visit as well."  5. Give patient instructions for MyChart download to smartphone OR [email or web address] as below if video visit (depending on what platform provider is using)  6. Inform patient they will receive a phone call 15 minutes prior to their appointment time (may be from unknown caller ID) so they should be prepared to answer    TELEPHONE CALL NOTE  Sarah White has been deemed a candidate for a follow-up tele-health visit to limit community exposure during the Covid-19 pandemic. I spoke with the patient via phone to ensure availability of phone/video source, confirm preferred email & phone number, and discuss instructions and expectations.  I reminded Sarah White to be prepared with any vital sign and/or heart rhythm information that could potentially be obtained via home monitoring, at the time of her visit. I reminded Sarah White to expect a phone call prior to  her visit.  Alisa Graff, [place] 12/15/2019 1:46 PM   INSTRUCTIONS FOR DOWNLOADING THE MYCHART APP TO SMARTPHONE  - The patient must first make sure to have activated MyChart and know their login information - If Apple, go to [employer] and type in MyChart in the search bar and download the app. If Android, ask patient to go to [employer] and type in [place] in the search bar and download the app. The app is free but as with any other app downloads, their phone may require them to verify saved payment information or Apple/Android  password.  - The patient will need to then log into the app with their MyChart username and password, and select [facility] as their healthcare provider to link the account. When it is time for your visit, go to the MyChart app, find appointments, and click Begin Video Visit. Be sure to Select Allow for your device to access the Microphone and Camera for your visit. You will then be connected, and your provider will be with you shortly.  **If they have any issues connecting, or need assistance please contact MyChart service desk [phone])**  **If using a computer, in order to ensure the best quality for their visit they will need to use either of the following Internet Browsers: [employer], or Google Chrome**  IF USING DOXIMITY or DOXY.ME - The patient will receive a link just prior to their visit by text.     FULL LENGTH CONSENT FOR TELE-HEALTH VISIT   I hereby voluntarily request, consent and authorize [facility] and its employed or contracted physicians, physician assistants, nurse practitioners or other licensed health care professionals (the Practitioner), to provide me with telemedicine health care services (the "Services") as deemed necessary by the treating Practitioner. I acknowledge and consent to receive the Services by the Practitioner via telemedicine. I understand that the telemedicine visit will involve communicating with the Practitioner through live audiovisual communication technology and the disclosure of certain medical information by electronic transmission. I acknowledge that I have been given the opportunity to request an in-person assessment or other available alternative prior to the telemedicine visit and am voluntarily participating in the telemedicine visit.  I understand that I have the right to withhold or withdraw my consent to the use of telemedicine in the course of my care at any time, without affecting my right to future care or treatment, and  that the Practitioner or I may terminate the telemedicine visit at any time. I understand that I have the right to inspect all information obtained and/or recorded in the course of the telemedicine visit and may receive copies of available information for a reasonable fee.  I understand that some of the potential risks of receiving the Services via telemedicine include:  Marland Kitchen Delay or interruption in medical evaluation due to technological equipment failure or disruption; . Information transmitted may not be sufficient (e.g. poor resolution of images) to allow for appropriate medical decision making by the Practitioner; and/or  . In rare instances, security protocols could fail, causing a breach of personal health information.  Furthermore, I acknowledge that it is my responsibility to provide information about my medical history, conditions and care that is complete and accurate to the best of my ability. I acknowledge that Practitioner's advice, recommendations, and/or decision may be based on factors not within their control, such as incomplete or inaccurate data provided by me or distortions of diagnostic images or specimens that may result from electronic transmissions.  I understand that the practice of medicine is not an exact science and that Practitioner makes no warranties or guarantees regarding treatment outcomes. I acknowledge that I will receive a copy of this consent concurrently upon execution via email to the email address I last provided but may also request a printed copy by calling the office of [facility].    I understand that my insurance will be billed for this visit.   I have read or had this consent read to me. . I understand the contents of this consent, which adequately explains the benefits and risks of the Services being provided via telemedicine.  . I have been provided ample opportunity to ask questions regarding this consent and the Services and have had my  questions answered to my satisfaction. . I give my informed consent for the services to be provided through the use of telemedicine in my medical care  By participating in this telemedicine visit I agree to the above.

## 2019-12-18 ENCOUNTER — Ambulatory Visit (INDEPENDENT_AMBULATORY_CARE_PROVIDER_SITE_OTHER): Payer: Medicare Other | Admitting: Family Medicine

## 2019-12-18 ENCOUNTER — Other Ambulatory Visit: Payer: Self-pay

## 2019-12-18 ENCOUNTER — Encounter: Payer: Self-pay | Admitting: Family

## 2019-12-18 ENCOUNTER — Encounter: Payer: Self-pay | Admitting: Family Medicine

## 2019-12-18 ENCOUNTER — Other Ambulatory Visit: Payer: Self-pay | Admitting: Family Medicine

## 2019-12-18 ENCOUNTER — Ambulatory Visit: Payer: Medicare Other | Attending: Family | Admitting: Family

## 2019-12-18 VITALS — BP 109/60 | HR 75 | Temp 98.9°F | Wt 177.0 lb

## 2019-12-18 VITALS — BP 140/78 | HR 81 | Wt 177.0 lb

## 2019-12-18 DIAGNOSIS — I110 Hypertensive heart disease with heart failure: Secondary | ICD-10-CM | POA: Insufficient documentation

## 2019-12-18 DIAGNOSIS — R42 Dizziness and giddiness: Secondary | ICD-10-CM | POA: Diagnosis not present

## 2019-12-18 DIAGNOSIS — Z8249 Family history of ischemic heart disease and other diseases of the circulatory system: Secondary | ICD-10-CM | POA: Diagnosis not present

## 2019-12-18 DIAGNOSIS — E119 Type 2 diabetes mellitus without complications: Secondary | ICD-10-CM | POA: Diagnosis not present

## 2019-12-18 DIAGNOSIS — I472 Ventricular tachycardia, unspecified: Secondary | ICD-10-CM

## 2019-12-18 DIAGNOSIS — D72829 Elevated white blood cell count, unspecified: Secondary | ICD-10-CM | POA: Diagnosis not present

## 2019-12-18 DIAGNOSIS — R079 Chest pain, unspecified: Secondary | ICD-10-CM | POA: Diagnosis not present

## 2019-12-18 DIAGNOSIS — J449 Chronic obstructive pulmonary disease, unspecified: Secondary | ICD-10-CM | POA: Diagnosis not present

## 2019-12-18 DIAGNOSIS — Z720 Tobacco use: Secondary | ICD-10-CM

## 2019-12-18 DIAGNOSIS — J9602 Acute respiratory failure with hypercapnia: Secondary | ICD-10-CM

## 2019-12-18 DIAGNOSIS — I208 Other forms of angina pectoris: Secondary | ICD-10-CM

## 2019-12-18 DIAGNOSIS — Z79899 Other long term (current) drug therapy: Secondary | ICD-10-CM | POA: Diagnosis not present

## 2019-12-18 DIAGNOSIS — Z955 Presence of coronary angioplasty implant and graft: Secondary | ICD-10-CM | POA: Insufficient documentation

## 2019-12-18 DIAGNOSIS — G479 Sleep disorder, unspecified: Secondary | ICD-10-CM | POA: Insufficient documentation

## 2019-12-18 DIAGNOSIS — Z7982 Long term (current) use of aspirin: Secondary | ICD-10-CM | POA: Insufficient documentation

## 2019-12-18 DIAGNOSIS — Z7901 Long term (current) use of anticoagulants: Secondary | ICD-10-CM | POA: Diagnosis not present

## 2019-12-18 DIAGNOSIS — E782 Mixed hyperlipidemia: Secondary | ICD-10-CM | POA: Diagnosis not present

## 2019-12-18 DIAGNOSIS — J9621 Acute and chronic respiratory failure with hypoxia: Secondary | ICD-10-CM

## 2019-12-18 DIAGNOSIS — I5022 Chronic systolic (congestive) heart failure: Secondary | ICD-10-CM

## 2019-12-18 DIAGNOSIS — I5021 Acute systolic (congestive) heart failure: Secondary | ICD-10-CM

## 2019-12-18 DIAGNOSIS — G9341 Metabolic encephalopathy: Secondary | ICD-10-CM | POA: Diagnosis not present

## 2019-12-18 DIAGNOSIS — R05 Cough: Secondary | ICD-10-CM | POA: Diagnosis not present

## 2019-12-18 DIAGNOSIS — R5383 Other fatigue: Secondary | ICD-10-CM | POA: Insufficient documentation

## 2019-12-18 DIAGNOSIS — M549 Dorsalgia, unspecified: Secondary | ICD-10-CM | POA: Insufficient documentation

## 2019-12-18 DIAGNOSIS — I10 Essential (primary) hypertension: Secondary | ICD-10-CM

## 2019-12-18 DIAGNOSIS — F17210 Nicotine dependence, cigarettes, uncomplicated: Secondary | ICD-10-CM | POA: Diagnosis not present

## 2019-12-18 DIAGNOSIS — J9601 Acute respiratory failure with hypoxia: Secondary | ICD-10-CM | POA: Diagnosis not present

## 2019-12-18 DIAGNOSIS — J441 Chronic obstructive pulmonary disease with (acute) exacerbation: Secondary | ICD-10-CM

## 2019-12-18 MED ORDER — NITROGLYCERIN 0.4 MG SL SUBL: 0.4000 mg | SUBLINGUAL_TABLET | SUBLINGUAL | 2 refills | Status: DC | PRN

## 2019-12-18 MED ORDER — MECLIZINE HCL 12.5 MG PO TABS: 12.5000 mg | ORAL_TABLET | Freq: Three times a day (TID) | ORAL | 3 refills | Status: DC | PRN

## 2019-12-18 MED ORDER — LIDOCAINE 4 % EX PTCH: 1.0000 | MEDICATED_PATCH | Freq: Two times a day (BID) | CUTANEOUS | 3 refills | Status: DC

## 2019-12-18 NOTE — Assessment & Plan Note (Signed)
Resolved. Lungs clear. Continue inhalers. Continue to monitor.

## 2019-12-18 NOTE — Assessment & Plan Note (Signed)
Due for recheck on her labs. Drawn today. Await results.

## 2019-12-18 NOTE — Progress Notes (Signed)
BP 109/60 (BP Location: Left Arm, Patient Position: Sitting, Cuff Size: Normal)   Pulse 75   Temp 98.9 F (37.2 C) (Oral)   Wt 177 lb (80.3 kg)   SpO2 94%   BMI 29.45 kg/m    Subjective:    [name], female    DOB: 19-Nov-1945, 74 y.o.   MRN: [id number]  HPI: Sarah White is a 74 y.o. female  Chief Complaint  Patient presents with  . Hospitalization Follow-up   Transition of Care Hospital Follow up.   Hospital/Facility: [facility] D/C Physician: Dr. Jimmye Norman D/C Date: 12/07/19  Records Requested: 12/18/19 Records Received: 12/18/19 Records Reviewed: 12/18/19  Diagnoses on Discharge: Acute respiratory failure Decompensated CHF COPD exacerbation   Date of interactive Contact within 48 hours of discharge: 12/11/19 Contact was through: phone  Date of 7 day or 14 day face-to-face visit:  12/18/19  within 14 days  Outpatient Encounter Medications as of 12/18/2019  Medication Sig Note  . albuterol (VENTOLIN HFA) 108 (90 Base) MCG/ACT inhaler Inhale 2 puffs into the lungs every 6 (six) hours as needed for wheezing or shortness of breath.   Marland Kitchen amiodarone (PACERONE) 200 MG tablet Take 1 tablet (200 mg total) by mouth every 12 (twelve) hours.   Marland Kitchen aspirin EC 81 MG tablet Take 81 mg by mouth at bedtime.    Marland Kitchen atorvastatin (LIPITOR) 40 MG tablet Take 40 mg by mouth daily.   . carvedilol (COREG) 12.5 MG tablet TAKE 2 TABLETS BY MOUTH TWICE DAILY WITH A MEAL (Patient taking differently: Take 25 mg by mouth 2 (two) times daily with a meal. )   . diphenhydrAMINE (BENADRYL) 25 MG tablet Take 25 mg by mouth every 6 (six) hours as needed.   . Dulaglutide (TRULICITY) 1.5 0000000 SOPN Inject 1.5 mg into the skin once a week. 12/04/2019: Patient receives direct from manufacturer   . furosemide (LASIX) 40 MG tablet Take 0.5 tablets by mouth daily.   . isosorbide mononitrate (IMDUR) 60 MG 24 hr tablet Take 1 tablet (60 mg total) by mouth daily.   . meclizine (ANTIVERT) 12.5 MG tablet  Take 1 tablet (12.5 mg total) by mouth 3 (three) times daily as needed for dizziness.   . Multiple Vitamin (MULTIVITAMIN WITH MINERALS) TABS tablet Take 1 tablet by mouth daily.   . nitroGLYCERIN (NITROSTAT) 0.4 MG SL tablet Place 1 tablet (0.4 mg total) under the tongue every 5 (five) minutes as needed for chest pain.   Marland Kitchen ondansetron (ZOFRAN-ODT) 4 MG disintegrating tablet Take 4 mg by mouth every 8 (eight) hours as needed for nausea or vomiting.   . rosuvastatin (CRESTOR) 20 MG tablet Take 1 tablet (20 mg total) by mouth daily.   . sacubitril-valsartan (ENTRESTO) 24-26 MG Take 1 tablet by mouth 2 (two) times daily.   . sertraline (ZOLOFT) 100 MG tablet Take 2 tablets (200 mg total) by mouth daily.   Marland Kitchen tiotropium (SPIRIVA HANDIHALER) 18 MCG inhalation capsule Place 1 capsule (18 mcg total) into inhaler and inhale daily.   . traZODone (DESYREL) 50 MG tablet TAKE 1/2 TO 1 (ONE-HALF TO ONE) TABLET BY MOUTH AT BEDTIME AS NEEDED FOR SLEEP (Patient taking differently: Take 25-50 mg by mouth at bedtime as needed for sleep. )   . [DISCONTINUED] meclizine (ANTIVERT) 12.5 MG tablet Take 12.5 mg by mouth 3 (three) times daily as needed for dizziness.   . [DISCONTINUED] nitroGLYCERIN (NITROSTAT) 0.4 MG SL tablet Place 0.4 mg under the tongue every 5 (five)  minutes as needed for chest pain.   . [DISCONTINUED] oxyCODONE-acetaminophen (PERCOCET/ROXICET) 5-325 MG tablet Take 1 tablet by mouth every 4 (four) hours as needed for severe pain.   . budesonide-formoterol (SYMBICORT) 160-4.5 MCG/ACT inhaler Inhale 2 puffs into the lungs 2 (two) times daily. (Patient not taking: Reported on 12/18/2019) 12/04/2019: Patient receives direct from manufacturer  . cyclobenzaprine (FLEXERIL) 10 MG tablet Take 1 tablet (10 mg total) by mouth 3 (three) times daily as needed for muscle spasms. DO NOT DRIVE ON THIS MEDICINE- WILL MAKE YOU SLEEPY (Patient not taking: Reported on 12/18/2019)   . Lidocaine 4 % PTCH Apply 1 patch topically  every 12 (twelve) hours.   . triamcinolone ointment (KENALOG) 0.5 % Apply 1 application topically 2 (two) times daily. (Patient not taking: Reported on 12/18/2019)   . [DISCONTINUED] furosemide (LASIX) 40 MG tablet Take 1 tablet (40 mg total) by mouth daily.   . [DISCONTINUED] losartan (COZAAR) 25 MG tablet Take 1 tablet (25 mg total) by mouth daily.    No facility-administered encounter medications on file as of 12/18/2019.  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 who presents to [facility] ED on 12/03/2019 due to acute respiratory distress.Patient is currently intubated and sedated and no family is present, therefore history is obtained from ED and nursing notes.Per notes EMS was called today as the patient was having progressive shortness of breath over the last several days.EMS found her tachypneic,hypoxic with O2 sats in the 50's,but she was able to converse.In route to the hospital she became decreasinglyresponsive with eventual agonal respirations.Upon arrival to the ED she was noted to have a GCS of 3, therefore she was emergently intubated.  Initial blood gas showed severe hypoxic hypercapnic respiratory failure.Shortly after intubation she developed what appeared to be ventricular tachycardia(never lost pulse)of which she was given amiodarone bolus and placed on amiodarone drip.EKG revealed left bundle branch block (not new)and no ST changes.Chest x-ray concerning for cardiomegaly and pulmonary vascular congestion.CT head is negative for any acute intracranial abnormality.Lab work revealed BNP 578, high-sensitivity troponin11,lactic acid 3, WBC 11.9 with lymphocytosis, procalcitonin 0.11.Her SARS-CoV-2 PCR and influenza PCR botharenegative.She was noted to  be slightly febrile with temperature 100.4.She was given 1 L IV fluids, broad-spectrum antibiotics, 125 mg of IV Solu-Medrol, and 3 duo nebs in the ED.  PCCM is asked admit the patient for further work-up and treatment of acute on chronic hypoxic hypercapnic respiratory failure secondary to acute decompensatedHFrEFand acute COPD exacerbation.  Hospital course from Dr. Lenise Herald 12/06/19-12/07/19: Pt was found to acute respiratory failure secondary to acute decompensated CHF & acute COPD exacerbation. Pt was initially admitted to the ICU where the pt was intubated and was extubated on 12/04/19. Pt was treated w/ IV lasix, bronchodilators, abxs and supplemental oxygen. Pt was responded relatively well to above and below stated treatment. Pt was able to be weaned off of supplemental oxygen prior to d/c. Of note, cardio saw the pt for acute CHF exacerbation as well as ventricular tachycardia. Pt was started on amiodarone and pt was d/c with this medication as well as per cardio. Pt will f/u w/ Dr. Clayborn Bigness (cardio) in 1 week outpatient. Pt verbalized her understanding. Furthermore, pt was sent home w/ 5 days more of prednisone to complete the course for a COPD exacerbation. For more information, please see previous  progress notes.    Discharge Diagnoses:  Active Problems:   Acute respiratory failure with hypoxia and hypercarbia (HCC)   Acute on chronic hypoxic hypercapnic respiratory failure:secondary to acute decompensatedsystolic CHF&acute COPD exacerbation. S/p extubation 12/04/19.Continue on bronchodilators andprednisone.Continue IV Lasix. Monitor I/Os.Covid 19 PCR is negative. Cardio following and recs apprec  Acute decompensatedsystolic CHF exacerbation:EF 40-45% in September 2020& EF 30-35% currently. Continue on IV lasix. Monitor I/Os. Cardio following and recs apprec  Ventricular tachycardia: continue amiodarone200mg  BID & will reduce to 200mg  daily as an outpatient w/  cardio.Defer full dose anticoagulation at this timeas per cardio.  Leukocytosis: resolved.COVID-19 PCR and influenza PCR both negative Received cefepime, Flagyl, vancomycin ED  Acute metabolic encephalopathy:secondary to severe hypercapnia. CT head negative on 12/03/19.Likely close to baseline"  Diagnostic Tests Reviewed  Disposition: Home  Consults: Cardiology  Discharge Instructions: Follow up here in 1-2 weeks. Follow up with cardiology 1 week  Disease/illness Education: Discussed today  Home Health/Community Services Discussions/Referrals:  N/A  Establishment or re-establishment of referral orders for community resources: N/A  Discussion with other health care providers: None  Assessment and Support of treatment regimen adherence:  Good  Appointments Coordinated with: Patient  Education for self-management, independent living, and ADLs: Discussed today  Since getting out of the hospital, Jennaya has been feeling not good. She is hurting in her chest. She is very sore along her sternum and between her shoulder blades. She had compressions during the ambulance ride. She has been a bit constipated, but she used 2 enemas and is now feeling better and is no longer constipated. She had a virtual appointment with the CHF clinic today. She saw cardiology on 12/15/19 and had her losartan changed to entresto. She's scheduled for a stress test on 01/02/20. She notes that her breathing has been doing better. She is otherwise feeling well. She is tired. No other concerns or complaints at this time.   Relevant past medical, surgical, family and social history reviewed and updated as indicated. Interim medical history since our last visit reviewed. Allergies and medications reviewed and updated.  Review of Systems  Constitutional: Positive for fatigue. Negative for activity change, appetite change, chills, diaphoresis, fever and unexpected weight change.  Respiratory: Negative.     Cardiovascular: Positive for chest pain. Negative for palpitations and leg swelling.  Gastrointestinal: Negative.   Psychiatric/Behavioral: Negative.     Per HPI unless specifically indicated above     Objective:    BP 109/60 (BP Location: Left Arm, Patient Position: Sitting, Cuff Size: Normal)   Pulse 75   Temp 98.9 F (37.2 C) (Oral)   Wt 177 lb (80.3 kg)   SpO2 94%   BMI 29.45 kg/m   Wt Readings from Last 3 Encounters:  12/18/19 177 lb (80.3 kg)  12/18/19 177 lb (80.3 kg)  12/11/19 174 lb (78.9 kg)    Physical Exam Vitals and nursing note reviewed.  Constitutional:      General: She is not in acute distress.    Appearance: Normal appearance. She is not ill-appearing, toxic-appearing or diaphoretic.  HENT:     Head: Normocephalic and atraumatic.     Right Ear: External ear normal.     Left Ear: External ear normal.     Nose: Nose normal.     Mouth/Throat:     Mouth: Mucous membranes are moist.     Pharynx: Oropharynx is clear.  Eyes:     General: No scleral icterus.       Right eye: No  discharge.        Left eye: No discharge.     Extraocular Movements: Extraocular movements intact.     Conjunctiva/sclera: Conjunctivae normal.     Pupils: Pupils are equal, round, and reactive to light.  Cardiovascular:     Rate and Rhythm: Normal rate and regular rhythm.     Pulses: Normal pulses.     Heart sounds: Normal heart sounds. No murmur. No friction rub. No gallop.   Pulmonary:     Effort: Pulmonary effort is normal. No respiratory distress.     Breath sounds: Normal breath sounds. No stridor. No wheezing, rhonchi or rales.  Chest:     Chest wall: No tenderness.  Musculoskeletal:        General: Normal range of motion.     Cervical back: Normal range of motion and neck supple.  Skin:    General: Skin is warm and dry.     Capillary Refill: Capillary refill takes less than 2 seconds.     Coloration: Skin is not jaundiced or pale.     Findings: No bruising,  erythema, lesion or rash.  Neurological:     General: No focal deficit present.     Mental Status: She is alert and oriented to person, place, and time. Mental status is at baseline.  Psychiatric:        Mood and Affect: Mood normal.        Behavior: Behavior normal.        Thought Content: Thought content normal.        Judgment: Judgment normal.     Results for orders placed or performed during the hospital encounter of 12/03/19  Respiratory Panel by RT PCR (Flu A&B, Covid) - Nasopharyngeal Swab   Specimen: Nasopharyngeal Swab  Result Value Ref Range   SARS Coronavirus 2 by RT PCR NEGATIVE NEGATIVE   Influenza A by PCR NEGATIVE NEGATIVE   Influenza B by PCR NEGATIVE NEGATIVE  Blood culture (routine x 2)   Specimen: BLOOD  Result Value Ref Range   Specimen Description BLOOD BLOOD LEFT HAND    Special Requests      BOTTLES DRAWN AEROBIC AND ANAEROBIC Blood Culture adequate volume   Culture      NO GROWTH 5 DAYS Performed at [facility], [address], [place], [place] [address]    Report Status 12/15/2019 FINAL   Blood culture (routine x 2)   Specimen: BLOOD  Result Value Ref Range   Specimen Description BLOOD BLOOD RIGHT FOREARM    Special Requests      BOTTLES DRAWN AEROBIC AND ANAEROBIC Blood Culture adequate volume   Culture      NO GROWTH 5 DAYS Performed at [facility], [address], [place], [place] [address]    Report Status 12/15/2019 FINAL   Urine culture   Specimen: Urine, Random  Result Value Ref Range   Specimen Description      URINE, RANDOM Performed at [facility], [address] [place], [place] [address]    Special Requests      NONE Performed at [facility], [address] [place], [place] [address]    Culture (A)     <10,000 COLONIES/mL INSIGNIFICANT GROWTH Performed at [facility] [address], [place], [place] [address]    Report Status 12/05/2019 FINAL   MRSA PCR Screening    Specimen: Nasopharyngeal  Result Value Ref Range   MRSA by PCR NEGATIVE NEGATIVE  CBC with  Differential  Result Value Ref Range   WBC 11.9 (H) 4.0 - 10.5 K/uL   RBC 5.42 (H) 3.87 - 5.11 MIL/uL   Hemoglobin 14.8 12.0 - 15.0 g/dL   HCT 47.4 (H) 36.0 - 46.0 %   MCV 87.5 80.0 - 100.0 fL   MCH 27.3 26.0 - 34.0 pg   MCHC 31.2 30.0 - 36.0 g/dL   RDW 14.9 11.5 - 15.5 %   Platelets 219 150 - 400 K/uL   nRBC 0.0 0.0 - 0.2 %   Neutrophils Relative % 50 %   Neutro Abs 6.1 1.7 - 7.7 K/uL   Lymphocytes Relative 41 %   Lymphs Abs 4.9 (H) 0.7 - 4.0 K/uL   Monocytes Relative 6 %   Monocytes Absolute 0.7 0.1 - 1.0 K/uL   Eosinophils Relative 2 %   Eosinophils Absolute 0.2 0.0 - 0.5 K/uL   Basophils Relative 0 %   Basophils Absolute 0.1 0.0 - 0.1 K/uL   Immature Granulocytes 1 %   Abs Immature Granulocytes 0.07 0.00 - 0.07 K/uL  Comprehensive metabolic panel  Result Value Ref Range   Sodium 139 135 - 145 mmol/L   Potassium 4.1 3.5 - 5.1 mmol/L   Chloride 101 98 - 111 mmol/L   CO2 23 22 - 32 mmol/L   Glucose, Bld 203 (H) 70 - 99 mg/dL   BUN 11 8 - 23 mg/dL   Creatinine, Ser 0.93 0.44 - 1.00 mg/dL   Calcium 9.1 8.9 - 10.3 mg/dL   Total Protein 7.8 6.5 - 8.1 g/dL   Albumin 4.6 3.5 - 5.0 g/dL   AST 67 (H) 15 - 41 U/L   ALT 49 (H) 0 - 44 U/L   Alkaline Phosphatase 124 38 - 126 U/L   Total Bilirubin 0.9 0.3 - 1.2 mg/dL   GFR calc non Af Amer >60 >60 mL/min   GFR calc Af Amer >60 >60 mL/min   Anion gap 15 5 - 15  Magnesium  Result Value Ref Range   Magnesium 2.3 1.7 - 2.4 mg/dL  Blood gas, venous  Result Value Ref Range   Delivery systems VENTILATOR    Mode ASSIST CONTROL    VT 450 mL   Peep/cpap 5.0 cm H20   pH, Ven 7.10 (LL) 7.250 - 7.430   pCO2, Ven 101 (HH) 44.0 - 60.0 mmHg   pO2, Ven 72.0 (H) 32.0 - 45.0 mmHg   Bicarbonate 31.4 (H) 20.0 - 28.0 mmol/L   Acid-base deficit 1.3 0.0 - 2.0 mmol/L   O2 Saturation 87.2 %   Patient temperature 37.0    Collection site VEIN    Sample  type VENOUS    Mechanical Rate 20   Lactic acid, plasma  Result Value Ref Range   Lactic Acid, Venous 3.0 (HH) 0.5 - 1.9 mmol/L  Lactic acid, plasma  Result Value Ref Range   Lactic Acid, Venous 1.0 0.5 - 1.9 mmol/L  Brain natriuretic peptide  Result Value Ref Range   B Natriuretic Peptide 578.0 (H) 0.0 - 100.0 pg/mL  Glucose, capillary  Result Value Ref Range   Glucose-Capillary 167 (H) 70 - 99 mg/dL  Triglycerides  Result Value Ref Range   Triglycerides 288 (H) <150 mg/dL  Urinalysis, Routine w reflex microscopic  Result Value Ref Range   Color, Urine YELLOW (A) YELLOW   APPearance CLEAR (A) CLEAR   Specific Gravity, Urine 1.005 1.005 - 1.030   pH 7.0 5.0 - 8.0   Glucose, UA NEGATIVE NEGATIVE mg/dL  Hgb urine dipstick NEGATIVE NEGATIVE   Bilirubin Urine NEGATIVE NEGATIVE   Ketones, ur NEGATIVE NEGATIVE mg/dL   Protein, ur 100 (A) NEGATIVE mg/dL   Nitrite NEGATIVE NEGATIVE   Leukocytes,Ua NEGATIVE NEGATIVE   RBC / HPF 0-5 0 - 5 RBC/hpf   WBC, UA 0-5 0 - 5 WBC/hpf   Bacteria, UA NONE SEEN NONE SEEN   Squamous Epithelial / LPF 0-5 0 - 5   Mucus PRESENT   Blood gas, arterial  Result Value Ref Range   FIO2 0.40    Delivery systems VENTILATOR    Mode PRESSURE REGULATED VOLUME CONTROL    VT 450 mL   LHR 28 resp/min   Peep/cpap 5.0 cm H20   pH, Arterial 7.38 7.350 - 7.450   pCO2 arterial 49 (H) 32.0 - 48.0 mmHg   pO2, Arterial 87 83.0 - 108.0 mmHg   Bicarbonate 29.0 (H) 20.0 - 28.0 mmol/L   Acid-Base Excess 3.0 (H) 0.0 - 2.0 mmol/L   O2 Saturation 96.4 %   Patient temperature 37.0    Collection site RIGHT RADIAL    Sample type ARTERIAL DRAW    Allens test (pass/fail) PASS PASS   Mechanical Rate 28   Procalcitonin - Baseline  Result Value Ref Range   Procalcitonin 0.11 ng/mL  Procalcitonin  Result Value Ref Range   Procalcitonin <0.10 ng/mL  CBC with Differential/Platelet  Result Value Ref Range   WBC 9.8 4.0 - 10.5 K/uL   RBC 4.58 3.87 - 5.11 MIL/uL    Hemoglobin 13.0 12.0 - 15.0 g/dL   HCT 38.6 36.0 - 46.0 %   MCV 84.3 80.0 - 100.0 fL   MCH 28.4 26.0 - 34.0 pg   MCHC 33.7 30.0 - 36.0 g/dL   RDW 15.0 11.5 - 15.5 %   Platelets 154 150 - 400 K/uL   nRBC 0.0 0.0 - 0.2 %   Neutrophils Relative % 94 %   Neutro Abs 9.1 (H) 1.7 - 7.7 K/uL   Lymphocytes Relative 4 %   Lymphs Abs 0.4 (L) 0.7 - 4.0 K/uL   Monocytes Relative 1 %   Monocytes Absolute 0.1 0.1 - 1.0 K/uL   Eosinophils Relative 0 %   Eosinophils Absolute 0.0 0.0 - 0.5 K/uL   Basophils Relative 0 %   Basophils Absolute 0.0 0.0 - 0.1 K/uL   Immature Granulocytes 1 %   Abs Immature Granulocytes 0.08 (H) 0.00 - 0.07 K/uL  Comprehensive metabolic panel  Result Value Ref Range   Sodium 140 135 - 145 mmol/L   Potassium 3.4 (L) 3.5 - 5.1 mmol/L   Chloride 103 98 - 111 mmol/L   CO2 27 22 - 32 mmol/L   Glucose, Bld 169 (H) 70 - 99 mg/dL   BUN 13 8 - 23 mg/dL   Creatinine, Ser 0.69 0.44 - 1.00 mg/dL   Calcium 8.4 (L) 8.9 - 10.3 mg/dL   Total Protein 5.9 (L) 6.5 - 8.1 g/dL   Albumin 3.4 (L) 3.5 - 5.0 g/dL   AST 104 (H) 15 - 41 U/L   ALT 103 (H) 0 - 44 U/L   Alkaline Phosphatase 109 38 - 126 U/L   Total Bilirubin 0.8 0.3 - 1.2 mg/dL   GFR calc non Af Amer >60 >60 mL/min   GFR calc Af Amer >60 >60 mL/min   Anion gap 10 5 - 15  Brain natriuretic peptide  Result Value Ref Range   B Natriuretic Peptide 777.0 (H) 0.0 - 100.0 pg/mL  Strep  pneumoniae urinary antigen  Result Value Ref Range   Strep Pneumo Urinary Antigen NEGATIVE NEGATIVE  Legionella Pneumophila Serogp 1 Ur Ag  Result Value Ref Range   L. pneumophila Serogp 1 Ur Ag Negative Negative   Source of Sample URINE, RANDOM   Triglycerides  Result Value Ref Range   Triglycerides 163 (H) <150 mg/dL  Hemoglobin A1c  Result Value Ref Range   Hgb A1c MFr Bld 6.1 (H) 4.8 - 5.6 %   Mean Plasma Glucose 128.37 mg/dL  Glucose, capillary  Result Value Ref Range   Glucose-Capillary 179 (H) 70 - 99 mg/dL  Glucose, capillary    Result Value Ref Range   Glucose-Capillary 158 (H) 70 - 99 mg/dL  Magnesium  Result Value Ref Range   Magnesium 1.9 1.7 - 2.4 mg/dL  Phosphorus  Result Value Ref Range   Phosphorus 2.3 (L) 2.5 - 4.6 mg/dL  Glucose, capillary  Result Value Ref Range   Glucose-Capillary 134 (H) 70 - 99 mg/dL  Procalcitonin  Result Value Ref Range   Procalcitonin 0.15 ng/mL  Basic metabolic panel  Result Value Ref Range   Sodium 142 135 - 145 mmol/L   Potassium 3.7 3.5 - 5.1 mmol/L   Chloride 101 98 - 111 mmol/L   CO2 32 22 - 32 mmol/L   Glucose, Bld 107 (H) 70 - 99 mg/dL   BUN 16 8 - 23 mg/dL   Creatinine, Ser 0.76 0.44 - 1.00 mg/dL   Calcium 9.0 8.9 - 10.3 mg/dL   GFR calc non Af Amer >60 >60 mL/min   GFR calc Af Amer >60 >60 mL/min   Anion gap 9 5 - 15  Magnesium  Result Value Ref Range   Magnesium 2.1 1.7 - 2.4 mg/dL  Phosphorus  Result Value Ref Range   Phosphorus 3.7 2.5 - 4.6 mg/dL  Glucose, capillary  Result Value Ref Range   Glucose-Capillary 115 (H) 70 - 99 mg/dL  Glucose, capillary  Result Value Ref Range   Glucose-Capillary 106 (H) 70 - 99 mg/dL  Glucose, capillary  Result Value Ref Range   Glucose-Capillary 100 (H) 70 - 99 mg/dL  Glucose, capillary  Result Value Ref Range   Glucose-Capillary 153 (H) 70 - 99 mg/dL  Glucose, capillary  Result Value Ref Range   Glucose-Capillary 104 (H) 70 - 99 mg/dL  Basic metabolic panel  Result Value Ref Range   Sodium 141 135 - 145 mmol/L   Potassium 4.1 3.5 - 5.1 mmol/L   Chloride 100 98 - 111 mmol/L   CO2 32 22 - 32 mmol/L   Glucose, Bld 90 70 - 99 mg/dL   BUN 21 8 - 23 mg/dL   Creatinine, Ser 0.69 0.44 - 1.00 mg/dL   Calcium 9.2 8.9 - 10.3 mg/dL   GFR calc non Af Amer >60 >60 mL/min   GFR calc Af Amer >60 >60 mL/min   Anion gap 9 5 - 15  Glucose, capillary  Result Value Ref Range   Glucose-Capillary 107 (H) 70 - 99 mg/dL   Comment 1 Notify RN   Glucose, capillary  Result Value Ref Range   Glucose-Capillary 92 70 - 99  mg/dL  CBC  Result Value Ref Range   WBC 8.8 4.0 - 10.5 K/uL   RBC 4.19 3.87 - 5.11 MIL/uL   Hemoglobin 11.3 (L) 12.0 - 15.0 g/dL   HCT 36.5 36.0 - 46.0 %   MCV 87.1 80.0 - 100.0 fL   MCH 27.0 26.0 - 34.0 pg  MCHC 31.0 30.0 - 36.0 g/dL   RDW 15.2 11.5 - 15.5 %   Platelets 118 (L) 150 - 400 K/uL   nRBC 0.0 0.0 - 0.2 %  Glucose, capillary  Result Value Ref Range   Glucose-Capillary 135 (H) 70 - 99 mg/dL  Glucose, capillary  Result Value Ref Range   Glucose-Capillary 115 (H) 70 - 99 mg/dL  CBC  Result Value Ref Range   WBC 9.7 4.0 - 10.5 K/uL   RBC 4.52 3.87 - 5.11 MIL/uL   Hemoglobin 12.1 12.0 - 15.0 g/dL   HCT 38.1 36.0 - 46.0 %   MCV 84.3 80.0 - 100.0 fL   MCH 26.8 26.0 - 34.0 pg   MCHC 31.8 30.0 - 36.0 g/dL   RDW 14.8 11.5 - 15.5 %   Platelets 125 (L) 150 - 400 K/uL   nRBC 0.0 0.0 - 0.2 %  Comprehensive metabolic panel  Result Value Ref Range   Sodium 139 135 - 145 mmol/L   Potassium 3.8 3.5 - 5.1 mmol/L   Chloride 98 98 - 111 mmol/L   CO2 30 22 - 32 mmol/L   Glucose, Bld 102 (H) 70 - 99 mg/dL   BUN 27 (H) 8 - 23 mg/dL   Creatinine, Ser 0.69 0.44 - 1.00 mg/dL   Calcium 9.3 8.9 - 10.3 mg/dL   Total Protein 6.2 (L) 6.5 - 8.1 g/dL   Albumin 3.6 3.5 - 5.0 g/dL   AST 22 15 - 41 U/L   ALT 46 (H) 0 - 44 U/L   Alkaline Phosphatase 83 38 - 126 U/L   Total Bilirubin 0.6 0.3 - 1.2 mg/dL   GFR calc non Af Amer >60 >60 mL/min   GFR calc Af Amer >60 >60 mL/min   Anion gap 11 5 - 15  Glucose, capillary  Result Value Ref Range   Glucose-Capillary 108 (H) 70 - 99 mg/dL  Glucose, capillary  Result Value Ref Range   Glucose-Capillary 107 (H) 70 - 99 mg/dL  ECHOCARDIOGRAM COMPLETE  Result Value Ref Range   Weight 2,927.71 oz   Height 65 in   BP 149/122 mmHg  Troponin I (High Sensitivity)  Result Value Ref Range   Troponin I (High Sensitivity) 11 <18 ng/L  Troponin I (High Sensitivity)  Result Value Ref Range   Troponin I (High Sensitivity) 32 (H) <18 ng/L  Troponin I  (High Sensitivity)  Result Value Ref Range   Troponin I (High Sensitivity) 61 (H) <18 ng/L      Assessment & Plan:   Problem List Items Addressed This Visit      Respiratory   Acute on chronic respiratory failure with hypoxia (HCC)    Resolved. Lungs clear. Continue inhalers. Continue to monitor.      Acute respiratory failure with hypoxia and hypercarbia (HCC)    Resolved. Lungs clear. Continue inhalers. Continue to monitor.        Other   Hyperlipidemia    Due for recheck on her labs. Drawn today. Await results.       Relevant Medications   furosemide (LASIX) 40 MG tablet   atorvastatin (LIPITOR) 40 MG tablet   nitroGLYCERIN (NITROSTAT) 0.4 MG SL tablet   Other Relevant Orders   Lipid Panel w/o Chol/HDL Ratio    Other Visit Diagnoses    COPD exacerbation ([place])    -  Primary   Resolved. Lungs clear. Continue inhalers. Continue to monitor.    Relevant Medications   diphenhydrAMINE (BENADRYL) 25 MG tablet  Acute systolic congestive heart failure (HCC)       Euvolemic today. Continue daily weights and following with CHF clinic. Continue to monitor. Call with any concerns.    Relevant Medications   furosemide (LASIX) 40 MG tablet   atorvastatin (LIPITOR) 40 MG tablet   nitroGLYCERIN (NITROSTAT) 0.4 MG SL tablet   Other Relevant Orders   Comprehensive metabolic panel   Ventricular tachycardia [facility])       Following with cardiology. Now on amiodarone. Continue to monitor.    Relevant Medications   furosemide (LASIX) 40 MG tablet   atorvastatin (LIPITOR) 40 MG tablet   nitroGLYCERIN (NITROSTAT) 0.4 MG SL tablet   Acute metabolic encephalopathy       Resolved. Doing much better. Rechecking labs today.    Relevant Orders   Comprehensive metabolic panel   Leukocytosis, unspecified type       Rechecking labs today. Await results.    Relevant Orders   CBC with Differential/Platelet   Comprehensive metabolic panel   Angina at rest [facility])       Due to have stress test on  5/4. Continue to follow with cardiology. Refill of her nitro given today. Call with any concerns.    Relevant Medications   furosemide (LASIX) 40 MG tablet   atorvastatin (LIPITOR) 40 MG tablet   nitroGLYCERIN (NITROSTAT) 0.4 MG SL tablet   Chest pain, unspecified type       Unclear if angina or in part due to compressions. Await stress test on 01/02/20. Will start lidocaine patches. Rx sent to pharmacy. Call with any concerns.        Follow up plan: Return in about 4 weeks (around 01/15/2020).

## 2019-12-18 NOTE — Progress Notes (Signed)
Virtual Visit via Telephone Note    Evaluation Performed:  Follow-up visit  This visit type was conducted due to national recommendations for restrictions regarding the COVID-19 Pandemic (e.g. social distancing).  This format is felt to be most appropriate for this patient at this time.  All issues noted in this document were discussed and addressed.  No physical exam was performed (except for noted visual exam findings with Video Visits).  Please refer to the patient's chart (MyChart message for video visits and phone note for telephone visits) for the patient's consent to telehealth for [facility]  Date:  12/18/2019   ID:  [name], DOB [birth year], MRN [id number]  Patient Location:  [address] [place] [address]   Provider location:   [facility] [place] [address], [place] [address]  PCP:  Valerie Roys, DO  Cardiologist:  Lujean Amel, MD Electrophysiologist:  None   Chief Complaint:  fatigue  History of Present Illness:    RHILEY GAYLOR is a 74 y.o. female who presents via audio/video conferencing for a telehealth visit today.  Patient verified DOB and address.  The patient does not have symptoms concerning for COVID-19 infection (fever, chills, cough, or new SHORTNESS OF BREATH).   Patient reports minimal fatigue upon moderate exertion. She describes this as chronic in nature having been present for several years. She has associated chronic difficulty sleeping, productive cough, back pain, pain over mid-sternum and occasional light-headedness along with this. She denies any weight gain, head congestion, shortness of breath, chest pain, palpitations, pedal edema or abdominal distention.   Saw cardiology on 12/15/19 and had losartan changed to entresto. Says that she's scheduled for a stress test on 01/02/20  Prior CV studies:   The following studies were reviewed today:  Echo report from 12/04/19 reviewed and showed an EF  of 30-35% along with mild LVH, moderate/severe MR and mild/ moderate TR.   Past Medical History:  Diagnosis Date  . Acute pancreatitis 09/09/2018  . Acute respiratory failure with hypoxia and hypercarbia ([place]) 07/22/2015  . Cancer ([place])    uterine  . CHF (congestive heart failure) ([place])   . COPD (chronic obstructive pulmonary disease) ([place])   . Diabetes mellitus without complication ([place])   . Encephalopathy acute 07/22/2015  . Hypertension   . Pneumonia    November 2016  . Pulmonary edema 07/22/2015  . 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 ABDOMINAL HYSTERECTOMY    . CARPAL TUNNEL RELEASE Bilateral   . CESAREAN SECTION    . CHOLECYSTECTOMY N/A 09/12/2018   Procedure: LAPAROSCOPIC CHOLECYSTECTOMY WITH INTRAOPERATIVE CHOLANGIOGRAM;  Surgeon: Herbert Pun, MD;  Location: [facility] ORS;  Service: General;  Laterality: N/A;  . CORONARY ANGIOPLASTY WITH STENT PLACEMENT    . KNEE SURGERY Left   . LEFT HEART CATH AND CORONARY ANGIOGRAPHY Right 11/26/2016   Procedure: Left Heart Cath and Coronary Angiography;  Surgeon: Isaias Cowman, MD;  Location: [place] CV LAB;  Service: Cardiovascular;  Laterality: Right;     Current Meds  Medication Sig  . albuterol (VENTOLIN HFA) 108 (90 Base) MCG/ACT inhaler Inhale 2 puffs into the lungs every 6 (six) hours as needed for wheezing or shortness of breath.  Marland Kitchen amiodarone (PACERONE) 200 MG tablet Take 1 tablet (200 mg total) by mouth every 12 (twelve) hours.  Marland Kitchen aspirin EC 81 MG tablet Take 81 mg by  mouth at bedtime.   . budesonide-formoterol (SYMBICORT) 160-4.5 MCG/ACT inhaler Inhale 2 puffs into the lungs 2 (two) times daily.  . carvedilol (COREG) 12.5 MG tablet TAKE 2 TABLETS BY MOUTH TWICE DAILY WITH A MEAL (Patient taking differently: Take 25 mg by mouth 2 (two) times daily with a meal. )  . cyclobenzaprine (FLEXERIL) 10 MG tablet Take 1 tablet (10 mg total) by mouth 3  (three) times daily as needed for muscle spasms. DO NOT DRIVE ON THIS MEDICINE- WILL MAKE YOU SLEEPY  . Dulaglutide (TRULICITY) 1.5 0000000 SOPN Inject 1.5 mg into the skin once a week.  . furosemide (LASIX) 40 MG tablet Take 1 tablet (40 mg total) by mouth daily.  . isosorbide mononitrate (IMDUR) 60 MG 24 hr tablet Take 1 tablet (60 mg total) by mouth daily.  . Multiple Vitamin (MULTIVITAMIN WITH MINERALS) TABS tablet Take 1 tablet by mouth daily.  . ondansetron (ZOFRAN-ODT) 4 MG disintegrating tablet Take 4 mg by mouth every 8 (eight) hours as needed for nausea or vomiting.  . rosuvastatin (CRESTOR) 20 MG tablet Take 1 tablet (20 mg total) by mouth daily.  . sacubitril-valsartan (ENTRESTO) 24-26 MG Take 1 tablet by mouth 2 (two) times daily.  . sertraline (ZOLOFT) 100 MG tablet Take 2 tablets (200 mg total) by mouth daily.  Marland Kitchen tiotropium (SPIRIVA HANDIHALER) 18 MCG inhalation capsule Place 1 capsule (18 mcg total) into inhaler and inhale daily.  . traZODone (DESYREL) 50 MG tablet TAKE 1/2 TO 1 (ONE-HALF TO ONE) TABLET BY MOUTH AT BEDTIME AS NEEDED FOR SLEEP (Patient taking differently: Take 25-50 mg by mouth at bedtime as needed for sleep. )  . triamcinolone ointment (KENALOG) 0.5 % Apply 1 application topically 2 (two) times daily.     Allergies:   Amlodipine, Atorvastatin, Codeine, Hctz [hydrochlorothiazide], Lisinopril, and Metformin and related   Social History   Tobacco Use  . Smoking status: Current Every Day Smoker    Packs/day: 0.50    Types: Cigarettes  . Smokeless tobacco: Never Used  Substance Use Topics  . Alcohol use: No  . Drug use: No     Family Hx: The patient's family history includes Heart disease in her mother.  ROS:   Please see the history of present illness.     All other systems reviewed and are negative.   Labs/Other Tests and Data Reviewed:    Recent Labs: 12/04/2019: B Natriuretic Peptide 777.0 12/05/2019: Magnesium 2.1 12/07/2019: ALT 46; BUN 27;  Creatinine, Ser 0.69; Hemoglobin 12.1; Platelets 125; Potassium 3.8; Sodium 139   Recent Lipid Panel Lab Results  Component Value Date/Time   CHOL 154 08/22/2019 01:22 PM   CHOL 164 02/04/2016 10:43 AM   CHOL 258 (H) 01/16/2014 12:00 PM   TRIG 163 (H) 12/03/2019 11:20 PM   TRIG 258 (H) 02/04/2016 10:43 AM   TRIG 337 (H) 01/16/2014 12:00 PM   HDL 34 (L) 08/22/2019 01:22 PM   HDL 35 (L) 01/16/2014 12:00 PM   CHOLHDL 4.3 11/26/2016 08:10 AM   LDLCALC 87 08/22/2019 01:22 PM   LDLCALC 156 (H) 01/16/2014 12:00 PM    Wt Readings from Last 3 Encounters:  12/18/19 177 lb (80.3 kg)  12/11/19 174 lb (78.9 kg)  12/07/19 176 lb 2.4 oz (79.9 kg)     Exam:    Vital Signs:  BP 140/78 Comment: self-reported  Pulse 81 Comment: self-reported  Wt 177 lb (80.3 kg) Comment: self-reported  SpO2 94% Comment: self-reported  BMI 29.45 kg/m  Well nourished, well developed female in no  acute distress.   ASSESSMENT & PLAN:    1. Chronic heart failure with reduced ejection fraction- - NYHA class III - euvolemic today based on patient's description of symptoms - weighing daily and she was reminded to call for an overnight weight gain of >2 pounds or a weekly weight gain of >5 pounds - says that her home weight has been stable ~ 177 pounds - not adding salt and says that she doesn't cook with salt either; says that she can really taste the salt in foods - saw cardiology Clayborn Bigness) 12/15/19 - EF has declined so she's now taking entresto instead of losartan - scheduled for stress test on 01/02/20 - BNP 12/04/19 was 777.0  2: HTN- - self-reported BP is good today - saw PCP Wynetta Emery) 09/20/19 and returns later today - BMP 12/07/19 reviewed and showed sodium 139, potassium 3.8, creatinine 0.69 and GFR >60  3: DM- - A1c 12/04/19 was 6.1% - says that her glucose today was 148 after she ate  4: Tobacco use- - smoking ~1/2 ppd of cigarettes - says that she has quit in the past but doesn't have a desire  to quit at this time - complete cessation discussed for 3 minutes with her  COVID-19 Education: The signs and symptoms of COVID-19 were discussed with the patient and how to seek care for testing (follow up with PCP or arrange E-visit).  The importance of social distancing was discussed today.  Patient Risk:   After full review of this patients clinical status, I feel that they are at least moderate risk at this time.  Time:   Today, I have spent 17 minutes with the patient with telehealth technology discussing medications, symptoms and weight.     Medication Adjustments/Labs and Tests Ordered: Current medicines are reviewed at length with the patient today.  Concerns regarding medicines are outlined above.   Tests Ordered: No orders of the defined types were placed in this encounter.  Medication Changes: No orders of the defined types were placed in this encounter.   Disposition:  Follow-up in 6 months or sooner for any questions/problems before then.   Signed, Alisa Graff, FNP  12/18/2019 12:36 PM    [facility]

## 2019-12-18 NOTE — Telephone Encounter (Signed)
Medication Refill - Medication: Lidocaine 4 % PTCH    Preferred Pharmacy (with phone number or street name):  [facility] [place] [facility] [place] [facility] Phone:  [phone]  Fax:  [phone]       Agent: Please be advised that RX refills may take up to 3 business days. We ask that you follow-up with your pharmacy.

## 2019-12-18 NOTE — Patient Instructions (Signed)
Continue weighing daily and call for an overnight weight gain of > 2 pounds or a weekly weight gain of >5 pounds. 

## 2019-12-19 LAB — CBC WITH DIFFERENTIAL/PLATELET
Basophils Absolute: 0 10*3/uL (ref 0.0–0.2)
Basos: 0 %
EOS (ABSOLUTE): 0.2 10*3/uL (ref 0.0–0.4)
Eos: 2 %
Hematocrit: 41.4 % (ref 34.0–46.6)
Hemoglobin: 13.3 g/dL (ref 11.1–15.9)
Immature Grans (Abs): 0 10*3/uL (ref 0.0–0.1)
Immature Granulocytes: 0 %
Lymphocytes Absolute: 1.7 10*3/uL (ref 0.7–3.1)
Lymphs: 15 %
MCH: 26.8 pg (ref 26.6–33.0)
MCHC: 32.1 g/dL (ref 31.5–35.7)
MCV: 83 fL (ref 79–97)
Monocytes Absolute: 0.5 10*3/uL (ref 0.1–0.9)
Monocytes: 4 %
Neutrophils Absolute: 8.3 10*3/uL — ABNORMAL HIGH (ref 1.4–7.0)
Neutrophils: 79 %
Platelets: 177 10*3/uL (ref 150–450)
RBC: 4.97 x10E6/uL (ref 3.77–5.28)
RDW: 14.9 % (ref 11.7–15.4)
WBC: 10.7 10*3/uL (ref 3.4–10.8)

## 2019-12-19 LAB — COMPREHENSIVE METABOLIC PANEL
ALT: 17 IU/L (ref 0–32)
AST: 17 IU/L (ref 0–40)
Albumin/Globulin Ratio: 2.2 (ref 1.2–2.2)
Albumin: 3.9 g/dL (ref 3.7–4.7)
Alkaline Phosphatase: 107 IU/L (ref 39–117)
BUN/Creatinine Ratio: 13 (ref 12–28)
BUN: 12 mg/dL (ref 8–27)
Bilirubin Total: <0.2 mg/dL (ref 0.0–1.2)
CO2: 24 mmol/L (ref 20–29)
Calcium: 8.7 mg/dL (ref 8.7–10.3)
Chloride: 102 mmol/L (ref 96–106)
Creatinine, Ser: 0.91 mg/dL (ref 0.57–1.00)
GFR calc Af Amer: 73 mL/min/{1.73_m2} (ref >59)
GFR calc non Af Amer: 64 mL/min/{1.73_m2} (ref >59)
Globulin, Total: 1.8 g/dL (ref 1.5–4.5)
Glucose: 108 mg/dL — ABNORMAL HIGH (ref 65–99)
Potassium: 4.4 mmol/L (ref 3.5–5.2)
Sodium: 140 mmol/L (ref 134–144)
Total Protein: 5.7 g/dL — ABNORMAL LOW (ref 6.0–8.5)

## 2019-12-19 LAB — LIPID PANEL W/O CHOL/HDL RATIO
Cholesterol, Total: 135 mg/dL (ref 100–199)
HDL: 42 mg/dL (ref >39)
LDL Chol Calc (NIH): 69 mg/dL (ref 0–99)
Triglycerides: 138 mg/dL (ref 0–149)
VLDL Cholesterol Cal: 24 mg/dL (ref 5–40)

## 2019-12-21 ENCOUNTER — Other Ambulatory Visit: Payer: Self-pay | Admitting: Pharmacy Technician

## 2019-12-21 ENCOUNTER — Ambulatory Visit (INDEPENDENT_AMBULATORY_CARE_PROVIDER_SITE_OTHER): Payer: Medicare Other | Admitting: Pharmacist

## 2019-12-21 DIAGNOSIS — I2510 Atherosclerotic heart disease of native coronary artery without angina pectoris: Secondary | ICD-10-CM

## 2019-12-21 DIAGNOSIS — E1122 Type 2 diabetes mellitus with diabetic chronic kidney disease: Secondary | ICD-10-CM | POA: Diagnosis not present

## 2019-12-21 DIAGNOSIS — N183 Chronic kidney disease, stage 3 unspecified: Secondary | ICD-10-CM | POA: Diagnosis not present

## 2019-12-21 DIAGNOSIS — J449 Chronic obstructive pulmonary disease, unspecified: Secondary | ICD-10-CM

## 2019-12-21 DIAGNOSIS — I5021 Acute systolic (congestive) heart failure: Secondary | ICD-10-CM | POA: Diagnosis not present

## 2019-12-21 DIAGNOSIS — E782 Mixed hyperlipidemia: Secondary | ICD-10-CM | POA: Diagnosis not present

## 2019-12-21 DIAGNOSIS — F321 Major depressive disorder, single episode, moderate: Secondary | ICD-10-CM | POA: Diagnosis not present

## 2019-12-21 NOTE — Patient Outreach (Signed)
[facility]) Care Management  12/21/2019  MADRID Sarah White, Sarah White [id number]                                       Medication Assistance Referral  Referral From: [facility] Embedded RPh Catie T.   Medication/Company: [name] / [employer] Patient application portion:  N/A Embedded RPh to have patient sign while in clinic Provider application portion: Faxed  to Dr. Lujean Amel Provider address/fax verified via: Office website    Follow up:  Will follow up with provider in 10-15 business days to confirm application(s) have been received.  Sarah White P. Sarah White, [place]  [phone]

## 2019-12-21 NOTE — Chronic Care Management (AMB) (Signed)
Chronic Care Management   Follow Up Note   12/21/2019 Name: Sarah White MRN: [id number] DOB: 1946/06/03  Referred by: Valerie Roys, DO Reason for referral : Chronic Care Management (Medication Management)   Sarah White is a 74 y.o. year old female who is a primary care patient of Valerie Roys, DO. The CCM team was consulted for assistance with chronic disease management and care coordination needs.    Received call from patient with medication access needs.   Review of patient status, including review of consultants reports, relevant laboratory and other test results, and collaboration with appropriate care team members and the patient's provider was performed as part of comprehensive patient evaluation and provision of chronic care management services.    SDOH (Social Determinants of Health) assessments performed: Yes See Care Plan activities for detailed interventions related to [facility])     Outpatient Encounter Medications as of 12/21/2019  Medication Sig Note  . rosuvastatin (CRESTOR) 20 MG tablet Take 1 tablet (20 mg total) by mouth daily.   . sacubitril-valsartan (ENTRESTO) 24-26 MG Take 1 tablet by mouth 2 (two) times daily.   Marland Kitchen albuterol (VENTOLIN HFA) 108 (90 Base) MCG/ACT inhaler Inhale 2 puffs into the lungs every 6 (six) hours as needed for wheezing or shortness of breath.   Marland Kitchen amiodarone (PACERONE) 200 MG tablet Take 1 tablet (200 mg total) by mouth every 12 (twelve) hours. 12/21/2019: Reduced by cardiology to once daily   . aspirin EC 81 MG tablet Take 81 mg by mouth at bedtime.    . budesonide-formoterol (SYMBICORT) 160-4.5 MCG/ACT inhaler Inhale 2 puffs into the lungs 2 (two) times daily. (Patient not taking: Reported on 12/18/2019) 12/04/2019: Patient receives direct from manufacturer  . carvedilol (COREG) 12.5 MG tablet TAKE 2 TABLETS BY MOUTH TWICE DAILY WITH A MEAL (Patient taking differently: Take 25 mg by mouth 2 (two) times daily with a meal. )   .  cyclobenzaprine (FLEXERIL) 10 MG tablet Take 1 tablet (10 mg total) by mouth 3 (three) times daily as needed for muscle spasms. DO NOT DRIVE ON THIS MEDICINE- WILL MAKE YOU SLEEPY (Patient not taking: Reported on 12/18/2019)   . diphenhydrAMINE (BENADRYL) 25 MG tablet Take 25 mg by mouth every 6 (six) hours as needed.   . Dulaglutide (TRULICITY) 1.5 LD/3.5TS SOPN Inject 1.5 mg into the skin once a week. 12/04/2019: Patient receives direct from manufacturer   . furosemide (LASIX) 40 MG tablet Take 0.5 tablets by mouth daily.   . isosorbide mononitrate (IMDUR) 60 MG 24 hr tablet Take 1 tablet (60 mg total) by mouth daily.   . Lidocaine 4 % PTCH Apply 1 patch topically every 12 (twelve) hours.   . meclizine (ANTIVERT) 12.5 MG tablet Take 1 tablet (12.5 mg total) by mouth 3 (three) times daily as needed for dizziness.   . Multiple Vitamin (MULTIVITAMIN WITH MINERALS) TABS tablet Take 1 tablet by mouth daily.   . nitroGLYCERIN (NITROSTAT) 0.4 MG SL tablet Place 1 tablet (0.4 mg total) under the tongue every 5 (five) minutes as needed for chest pain.   Marland Kitchen ondansetron (ZOFRAN-ODT) 4 MG disintegrating tablet Take 4 mg by mouth every 8 (eight) hours as needed for nausea or vomiting.   . sertraline (ZOLOFT) 100 MG tablet Take 2 tablets (200 mg total) by mouth daily.   Marland Kitchen tiotropium (SPIRIVA HANDIHALER) 18 MCG inhalation capsule Place 1 capsule (18 mcg total) into inhaler and inhale daily.   . traZODone (DESYREL) 50 MG tablet TAKE  1/2 TO 1 (ONE-HALF TO ONE) TABLET BY MOUTH AT BEDTIME AS NEEDED FOR SLEEP (Patient taking differently: Take 25-50 mg by mouth at bedtime as needed for sleep. )   . triamcinolone ointment (KENALOG) 0.5 % Apply 1 application topically 2 (two) times daily. (Patient not taking: Reported on 12/18/2019)   . [DISCONTINUED] atorvastatin (LIPITOR) 40 MG tablet Take 40 mg by mouth daily.    No facility-administered encounter medications on file as of 12/21/2019.     Objective:   Goals Addressed              This Visit's Progress     Patient Stated   . PharmD "I can't afford my medications" (pt-stated)       CARE PLAN ENTRY (see longtitudinal plan of care for additional care plan information)  Current Barriers:  . Polypharmacy; complex patient with multiple comorbidities including HFrEF (EF 30-35%), CAD, COPD, DM o Calls today to report that she is concerned with the cost of Entresto; is going to be >$600. Was discharged from hospital w/ 30 day supply . Most recent eGFR: ~64 mL/min o HFrEF: Entresto 24/26 mg BID, carvedilol 12.5 mg BID, furosemide 20 mg daily; isosorbide 60 mg QAM  o Vtach: amiodarone 200 mg daily o ASCVD risk reduction: rosuvastatin 20 mg daily, ASA 81 mg daily o COPD: Symbicort 160/4.5 mcg 2 puffs BID, Spiriva 18 mg daily   - Receiving Symbicort through [place] assistance through 39/76/73 o A1PF: Trulicity 1.5 mg weekly, last A1c 7.9% - Receiving Trulicity through [employer] assistance through 08/30/20  Pharmacist Clinical Goal(s):  Marland Kitchen Over the next 90 days, patient will work with PharmD and provider towards optimized medication management  Interventions: . Comprehensive medication review performed; medication list updated in electronic medical record . Inter-disciplinary care team collaboration (see longitudinal plan of care) . Given short time frame, will have patient come by office tomorrow to sign patient assistance application. Will collaborate w/ CPhT to fax provider portion to Dr. Clayborn Bigness at [facility]. Once all parts received, will work w/ CPhT to submit.  Patient Self Care Activities:  . Patient will take medications as prescribed  Please see past updates related to this goal by clicking on the "Past Updates" button in the selected goal          Plan:  - Will collaborate w/ interdisciplinary team as above  [name], PharmD, [place] [phone]

## 2019-12-21 NOTE — Patient Instructions (Signed)
Visit Information  Goals Addressed            This Visit's Progress     Patient Stated   . PharmD "I can't afford my medications" (pt-stated)       CARE PLAN ENTRY (see longtitudinal plan of care for additional care plan information)  Current Barriers:  . Polypharmacy; complex patient with multiple comorbidities including HFrEF (EF 30-35%), CAD, COPD, DM o Calls today to report that she is concerned with the cost of Entresto; is going to be >$600. Was discharged from hospital w/ 30 day supply . Most recent eGFR: ~64 mL/min o HFrEF: Entresto 24/26 mg BID, carvedilol 12.5 mg BID, furosemide 20 mg daily; isosorbide 60 mg QAM  o Vtach: amiodarone 200 mg daily o ASCVD risk reduction: rosuvastatin 20 mg daily, ASA 81 mg daily o COPD: Symbicort 160/4.5 mcg 2 puffs BID, Spiriva 18 mg daily   - Receiving Symbicort through [place] assistance through 85/48/83 o G1EX: Trulicity 1.5 mg weekly, last A1c 5.9% - Receiving Trulicity through [employer] assistance through 08/30/20  Pharmacist Clinical Goal(s):  Marland Kitchen Over the next 90 days, patient will work with PharmD and provider towards optimized medication management  Interventions: . Comprehensive medication review performed; medication list updated in electronic medical record . Inter-disciplinary care team collaboration (see longitudinal plan of care) . Given short time frame, will have patient come by office tomorrow to sign patient assistance application. Will collaborate w/ CPhT to fax provider portion to Dr. Clayborn Bigness at [facility]. Once all parts received, will work w/ CPhT to submit.  Patient Self Care Activities:  . Patient will take medications as prescribed  Please see past updates related to this goal by clicking on the "Past Updates" button in the selected goal         Patient verbalizes understanding of instructions provided today.   Plan:  - Will collaborate w/ interdisciplinary team as above  [name], PharmD,  [place] [phone]

## 2019-12-25 ENCOUNTER — Telehealth: Payer: Self-pay | Admitting: Family Medicine

## 2019-12-25 NOTE — Telephone Encounter (Signed)
Called pt due to not seeing any messages on a call and clinical did not call her, no answer, left vm   Copied from [place] [id number]. Topic: General - Call Back - No Documentation >> Dec 25, 2019 12:48 PM Mcneil, Ja-Kwan wrote: Reason for CRM: Pt stated she was just returning call from a missed call she had. Pt requests call back

## 2019-12-25 NOTE — Telephone Encounter (Signed)
Pt is aware to disregard will call her back if needed

## 2019-12-29 ENCOUNTER — Ambulatory Visit: Payer: Self-pay | Admitting: Pharmacist

## 2019-12-29 NOTE — Chronic Care Management (AMB) (Addendum)
  Chronic Care Management   Note  12/29/2019 Name: Sarah White MRN: [id number] DOB: 1946/01/01  Sarah White is a 74 y.o. year old female who is a primary care patient of Valerie Roys, DO. The CCM team was consulted for assistance with chronic disease management and care coordination needs.    Contacted patient to follow up on her coming by clinic to sign patient portion of th [facility] assistance application. Patient notes she forgot. She will come by and sign as soon as she can. Once received, I will pass along to CPhT for submission and follow up  Addendum: received patient portion. Passing along to CPhT for submission and follow up.    [name], PharmD, [place] [phone]

## 2020-01-02 DIAGNOSIS — I208 Other forms of angina pectoris: Secondary | ICD-10-CM | POA: Diagnosis not present

## 2020-01-04 ENCOUNTER — Encounter: Payer: Self-pay | Admitting: Family Medicine

## 2020-01-04 ENCOUNTER — Other Ambulatory Visit: Payer: Self-pay | Admitting: Pharmacy Technician

## 2020-01-04 NOTE — Patient Outreach (Signed)
[facility]) Care Management  01/04/2020  Sarah White Oct 27, 1945 [id number]   Received both patient and provider portion(s) of patient assistance application(s) for [facility]. Faxed completed application and required documents into [employer].  Will follow up with company(ies) in 7-14 business days to check status of application(s).  [name] P. Santosha Jividen, [place]  [phone]

## 2020-01-08 DIAGNOSIS — J449 Chronic obstructive pulmonary disease, unspecified: Secondary | ICD-10-CM | POA: Diagnosis not present

## 2020-01-08 DIAGNOSIS — I219 Acute myocardial infarction, unspecified: Secondary | ICD-10-CM | POA: Diagnosis not present

## 2020-01-08 DIAGNOSIS — I5020 Unspecified systolic (congestive) heart failure: Secondary | ICD-10-CM | POA: Diagnosis not present

## 2020-01-08 DIAGNOSIS — J9621 Acute and chronic respiratory failure with hypoxia: Secondary | ICD-10-CM | POA: Diagnosis not present

## 2020-01-08 DIAGNOSIS — R0989 Other specified symptoms and signs involving the circulatory and respiratory systems: Secondary | ICD-10-CM | POA: Diagnosis not present

## 2020-01-08 DIAGNOSIS — I472 Ventricular tachycardia: Secondary | ICD-10-CM | POA: Diagnosis not present

## 2020-01-08 DIAGNOSIS — R42 Dizziness and giddiness: Secondary | ICD-10-CM | POA: Diagnosis not present

## 2020-01-08 DIAGNOSIS — E876 Hypokalemia: Secondary | ICD-10-CM | POA: Diagnosis not present

## 2020-01-08 DIAGNOSIS — E782 Mixed hyperlipidemia: Secondary | ICD-10-CM | POA: Diagnosis not present

## 2020-01-08 DIAGNOSIS — I10 Essential (primary) hypertension: Secondary | ICD-10-CM | POA: Diagnosis not present

## 2020-01-08 DIAGNOSIS — E1122 Type 2 diabetes mellitus with diabetic chronic kidney disease: Secondary | ICD-10-CM | POA: Diagnosis not present

## 2020-01-08 DIAGNOSIS — I2510 Atherosclerotic heart disease of native coronary artery without angina pectoris: Secondary | ICD-10-CM | POA: Diagnosis not present

## 2020-01-09 ENCOUNTER — Ambulatory Visit (INDEPENDENT_AMBULATORY_CARE_PROVIDER_SITE_OTHER): Payer: Medicare Other | Admitting: Pharmacist

## 2020-01-09 DIAGNOSIS — I5021 Acute systolic (congestive) heart failure: Secondary | ICD-10-CM

## 2020-01-09 DIAGNOSIS — E782 Mixed hyperlipidemia: Secondary | ICD-10-CM

## 2020-01-09 DIAGNOSIS — N183 Chronic kidney disease, stage 3 unspecified: Secondary | ICD-10-CM

## 2020-01-09 NOTE — Chronic Care Management (AMB) (Signed)
Chronic Care Management   Follow Up Note   01/09/2020 Name: Sarah White MRN: [id number] DOB: [birth year]  Referred by: Valerie Roys, DO Reason for referral : Chronic Care Management (Medication Management)   Sarah White is a 74 y.o. year old female who is a primary care patient of Valerie Roys, DO. The CCM team was consulted for assistance with chronic disease management and care coordination needs.    Contacted patient for medication management review.   Review of patient status, including review of consultants reports, relevant laboratory and other test results, and collaboration with appropriate care team members and the patient's provider was performed as part of comprehensive patient evaluation and provision of chronic care management services.    SDOH (Social Determinants of Health) assessments performed: Yes See Care Plan activities for detailed interventions related to [facility])     Outpatient Encounter Medications as of 01/09/2020  Medication Sig  . amiodarone (PACERONE) 200 MG tablet Take 100 mg by mouth.  . budesonide-formoterol (SYMBICORT) 160-4.5 MCG/ACT inhaler Inhale 2 puffs into the lungs 2 (two) times daily.  . carvedilol (COREG) 12.5 MG tablet TAKE 2 TABLETS BY MOUTH TWICE DAILY WITH A MEAL (Patient taking differently: Take 25 mg by mouth 2 (two) times daily with a meal. )  . Dulaglutide (TRULICITY) 1.5 IO/2.7OJ SOPN Inject 1.5 mg into the skin once a week.  . furosemide (LASIX) 40 MG tablet Take 0.5 tablets by mouth daily.  . isosorbide mononitrate (IMDUR) 60 MG 24 hr tablet Take 1 tablet (60 mg total) by mouth daily.  . rosuvastatin (CRESTOR) 20 MG tablet Take 1 tablet (20 mg total) by mouth daily.  . sacubitril-valsartan (ENTRESTO) 24-26 MG Take 1 tablet by mouth 2 (two) times daily.  . sertraline (ZOLOFT) 100 MG tablet Take 2 tablets (200 mg total) by mouth daily.  Marland Kitchen tiotropium (SPIRIVA HANDIHALER) 18 MCG inhalation capsule Place 1 capsule (18 mcg total)  into inhaler and inhale daily.  . traZODone (DESYREL) 50 MG tablet TAKE 1/2 TO 1 (ONE-HALF TO ONE) TABLET BY MOUTH AT BEDTIME AS NEEDED FOR SLEEP (Patient taking differently: Take 25-50 mg by mouth at bedtime as needed for sleep. )  . albuterol (VENTOLIN HFA) 108 (90 Base) MCG/ACT inhaler Inhale 2 puffs into the lungs every 6 (six) hours as needed for wheezing or shortness of breath.  Marland Kitchen aspirin EC 81 MG tablet Take 81 mg by mouth at bedtime.   . cyclobenzaprine (FLEXERIL) 10 MG tablet Take 1 tablet (10 mg total) by mouth 3 (three) times daily as needed for muscle spasms. DO NOT DRIVE ON THIS MEDICINE- WILL MAKE YOU SLEEPY (Patient not taking: Reported on 12/18/2019)  . diphenhydrAMINE (BENADRYL) 25 MG tablet Take 25 mg by mouth every 6 (six) hours as needed.  . Lidocaine 4 % PTCH Apply 1 patch topically every 12 (twelve) hours.  . meclizine (ANTIVERT) 12.5 MG tablet Take 1 tablet (12.5 mg total) by mouth 3 (three) times daily as needed for dizziness.  . Multiple Vitamin (MULTIVITAMIN WITH MINERALS) TABS tablet Take 1 tablet by mouth daily.  . nitroGLYCERIN (NITROSTAT) 0.4 MG SL tablet Place 1 tablet (0.4 mg total) under the tongue every 5 (five) minutes as needed for chest pain.  Marland Kitchen ondansetron (ZOFRAN-ODT) 4 MG disintegrating tablet Take 4 mg by mouth every 8 (eight) hours as needed for nausea or vomiting.  . triamcinolone ointment (KENALOG) 0.5 % Apply 1 application topically 2 (two) times daily. (Patient not taking: Reported on 12/18/2019)  . [  DISCONTINUED] amiodarone (PACERONE) 200 MG tablet Take 1 tablet (200 mg total) by mouth every 12 (twelve) hours.   No facility-administered encounter medications on file as of 01/09/2020.     Objective:   Goals Addressed            This Visit's Progress     Patient Stated   . PharmD "I can't afford my medications" (pt-stated)       CARE PLAN ENTRY (see longtitudinal plan of care for additional care plan information)  Current Barriers:   . Polypharmacy; complex patient with multiple comorbidities including HFrEF (EF 30-35%), CAD, COPD, DM o Reports that she is expecting a letter from [employer] today. Will call me back when she checks her mail if it tells her she is approved or denied for assistance.  . Reviewed that she fills a weekly pill box . Most recent eGFR: ~64 mL/min o HFrEF: Entresto 24/26 mg BID, carvedilol 12.5 mg BID, furosemide 20 mg daily; isosorbide 60 mg QAM  - Weighing daily: remaining stable around 176 lbs. Cannot verbalize when to seek medical care for weight gain - BP: 100-110s/60-70s o Vtach: amiodarone 100 mg BID - reduced by cardiology; reports she had an appointment yesterday, had stress test that indicated some evidence of ischemia; Had carotid artery test, reports having a doppler next week.  o ASCVD risk reduction: rosuvastatin 20 mg daily, ASA 81 mg daily o COPD: Symbicort 160/4.5 mcg 2 puffs BID, Spiriva 18 mg daily   - Receiving Symbicort through [employer] assistance through 16/10/96 o [id number]: Trulicity 1.5 mg weekly, last A1c 6.1% - Fasting: 100-110s in the past few days. Does not always check regularly. Mostly checking fastings recently  - Receiving Trulicity through [employer] assistance through 08/30/20  Pharmacist Clinical Goal(s):  Marland Kitchen Over the next 90 days, patient will work with PharmD and provider towards optimized medication management  Interventions: . Comprehensive medication review performed; medication list updated in electronic medical record . Inter-disciplinary care team collaboration (see longitudinal plan of care) . Reviewed goal A1c, goal fasting and goal 2 hour post prandial sugar.s  . Reviewed weight recommendations - contact cardiology if weight gain >3 lbs in one day (from morning to morning) or >5 lbs in one week.  . Reviewed goal BP <130/80 if tolerated w/o hypotension.  . Praised patient for monitoring readings over the past few days. Reviewed the benefit of chronically  monitoring home readings to evaluate for trends. Will mail [facility] calendar/log book today to aid in documentation and tracking of BG, BP, and weights.  . Reviewed refill history. Appears rosuvastatin was last filled in January. Reviewed with patient; she noted that she does have that medication, requested a refill from the pharmacy today, and that fill history was likely off due to time in the hospital. . Patient will call me when she receives letter from [employer] regarding contents.   Patient Self Care Activities:  . Patient will take medications as prescribed  Please see past updates related to this goal by clicking on the "Past Updates" button in the selected goal          Plan:  - Scheduled f/u call in ~ 10 weeks  [name], PharmD, [place] [phone]

## 2020-01-09 NOTE — Patient Instructions (Addendum)
Ms. Kath,   It was great talking with you today! Make sure you are filling your pill box as below:  Morning: - Amiodarone 100 mg (1/2 tablet) - Carvedilol 12.5 mg  - Entresto 24/26 mg  - Furosemide 40 mg  - Isosorbide 60 mg  - Sertraline 200 mg (2 tablets)  Evening: - Amiodarone 100 mg (1/2 tablet) - Carvedilol 12.5 mg  - Entresto 24/26 mg  - Rosuvastatin 20 mg  - Aspirin 81 mg   Call me with any questions!  [name], PharmD [phone]  Visit Information  Goals Addressed            This Visit's Progress     Patient Stated   . PharmD "I can't afford my medications" (pt-stated)       CARE PLAN ENTRY (see longtitudinal plan of care for additional care plan information)  Current Barriers:  . Polypharmacy; complex patient with multiple comorbidities including HFrEF (EF 30-35%), CAD, COPD, DM o Reports that she is expecting a letter from [employer] today. Will call me back when she checks her mail if it tells her she is approved or denied for assistance.  . Reviewed that she fills a weekly pill box . Most recent eGFR: ~64 mL/min o HFrEF: Entresto 24/26 mg BID, carvedilol 12.5 mg BID, furosemide 20 mg daily; isosorbide 60 mg QAM  - Weighing daily: remaining stable around 176 lbs. Cannot verbalize when to seek medical care for weight gain - BP: 100-110s/60-70s o Vtach: amiodarone 100 mg BID - reduced by cardiology; reports she had an appointment yesterday, had stress test that indicated some evidence of ischemia; Had carotid artery test, reports having a doppler next week.  o ASCVD risk reduction: rosuvastatin 20 mg daily, ASA 81 mg daily o COPD: Symbicort 160/4.5 mcg 2 puffs BID, Spiriva 18 mg daily   - Receiving Symbicort through [place] assistance through 63/01/60 o [id number]: Trulicity 1.5 mg weekly, last A1c 6.1% - Fasting: 100-110s in the past few days. Does not always check regularly. Mostly checking fastings recently  - Receiving Trulicity through [employer]  assistance through 08/30/20  Pharmacist Clinical Goal(s):  Marland Kitchen Over the next 90 days, patient will work with PharmD and provider towards optimized medication management  Interventions: . Comprehensive medication review performed; medication list updated in electronic medical record . Inter-disciplinary care team collaboration (see longitudinal plan of care) . Reviewed goal A1c, goal fasting and goal 2 hour post prandial sugar.s  . Reviewed weight recommendations - contact cardiology if weight gain >3 lbs in one day (from morning to morning) or >5 lbs in one week.  . Reviewed goal BP <130/80 if tolerated w/o hypotension.  . Praised patient for monitoring readings over the past few days. Reviewed the benefit of chronically monitoring home readings to evaluate for trends. Will mail [facility] calendar/log book today to aid in documentation and tracking of BG, BP, and weights.  . Reviewed refill history. Appears rosuvastatin was last filled in January. Reviewed with patient; she noted that she does have that medication, requested a refill from the pharmacy today, and that fill history was likely off due to time in the hospital. . Patient will call me when she receives letter from [employer] regarding contents.   Patient Self Care Activities:  . Patient will take medications as prescribed  Please see past updates related to this goal by clicking on the "Past Updates" button in the selected goal         The patient verbalized understanding of  instructions provided today and agreed to receive a mailed copy of patient instruction and/or educational materials.  Plan:  - Scheduled f/u call in ~ 10 weeks  [name], PharmD, [place] [phone]

## 2020-01-10 ENCOUNTER — Other Ambulatory Visit: Payer: Self-pay | Admitting: Pharmacy Technician

## 2020-01-10 NOTE — Patient Outreach (Signed)
[facility]) Care Management  01/10/2020  Sarah White 19-May-1946 [id number]  Care coordination call placed to [employer] in regards to patient's [facility] application.  Spoke to [place] who informed that the application was APPROVED 01/04/2020-08/30/2020. Shaun informed an order was placed today with an expected delivery on 01/12/2020.  Will followup with patient in 5-10 business days to confirm medication received and to discuss refill procedure.  Anishka Bushard P. Titan Karner, [place]  [phone]

## 2020-01-16 ENCOUNTER — Other Ambulatory Visit: Payer: Self-pay | Admitting: Pharmacy Technician

## 2020-01-16 NOTE — Patient Outreach (Signed)
[facility]) Care Management  01/16/2020  Sarah White February 06, 1946 [id number]    Successful call placed to patient regarding patient assistance medication delivery of Entresto with [employer], HIPAA identifiers verified.   Patient confirmed receiving the medication. Discussed refill procedure with patient which requires patient to phone the company when she needs a refill. Patient verbalized understanding. Confirmed patient had name and number as she had no other questions or concerns.   Follow up:  Will route note to [place] for case closure  Dale Ribeiro P. Horst Ostermiller, [place]  [phone]

## 2020-01-17 ENCOUNTER — Other Ambulatory Visit
Admission: RE | Admit: 2020-01-17 | Discharge: 2020-01-17 | Disposition: A | Discharge status: Home / Self Care | Payer: Medicare Other | Source: Ambulatory Visit | Attending: Student | Admitting: Student

## 2020-01-17 DIAGNOSIS — Z79899 Other long term (current) drug therapy: Secondary | ICD-10-CM | POA: Insufficient documentation

## 2020-01-17 DIAGNOSIS — I2510 Atherosclerotic heart disease of native coronary artery without angina pectoris: Secondary | ICD-10-CM | POA: Diagnosis not present

## 2020-01-17 DIAGNOSIS — E782 Mixed hyperlipidemia: Secondary | ICD-10-CM | POA: Diagnosis not present

## 2020-01-17 DIAGNOSIS — I6523 Occlusion and stenosis of bilateral carotid arteries: Secondary | ICD-10-CM | POA: Diagnosis not present

## 2020-01-17 DIAGNOSIS — I5021 Acute systolic (congestive) heart failure: Secondary | ICD-10-CM | POA: Diagnosis not present

## 2020-01-17 DIAGNOSIS — Z01818 Encounter for other preprocedural examination: Secondary | ICD-10-CM | POA: Insufficient documentation

## 2020-01-17 DIAGNOSIS — R0989 Other specified symptoms and signs involving the circulatory and respiratory systems: Secondary | ICD-10-CM | POA: Diagnosis not present

## 2020-01-17 DIAGNOSIS — I10 Essential (primary) hypertension: Secondary | ICD-10-CM | POA: Diagnosis not present

## 2020-01-17 DIAGNOSIS — I110 Hypertensive heart disease with heart failure: Secondary | ICD-10-CM | POA: Diagnosis not present

## 2020-01-17 LAB — BRAIN NATRIURETIC PEPTIDE: B Natriuretic Peptide: 144.3 pg/mL — ABNORMAL HIGH (ref 0.0–100.0)

## 2020-01-18 ENCOUNTER — Ambulatory Visit (INDEPENDENT_AMBULATORY_CARE_PROVIDER_SITE_OTHER): Payer: Medicare Other | Admitting: Family Medicine

## 2020-01-18 ENCOUNTER — Other Ambulatory Visit: Payer: Self-pay

## 2020-01-18 ENCOUNTER — Encounter: Payer: Self-pay | Admitting: Family Medicine

## 2020-01-18 VITALS — BP 132/62 | HR 75 | Temp 98.4°F | Ht 64.17 in | Wt 176.8 lb

## 2020-01-18 DIAGNOSIS — E1122 Type 2 diabetes mellitus with diabetic chronic kidney disease: Secondary | ICD-10-CM

## 2020-01-18 DIAGNOSIS — J449 Chronic obstructive pulmonary disease, unspecified: Secondary | ICD-10-CM | POA: Diagnosis not present

## 2020-01-18 DIAGNOSIS — I208 Other forms of angina pectoris: Secondary | ICD-10-CM | POA: Diagnosis not present

## 2020-01-18 DIAGNOSIS — I2510 Atherosclerotic heart disease of native coronary artery without angina pectoris: Secondary | ICD-10-CM | POA: Diagnosis not present

## 2020-01-18 DIAGNOSIS — N183 Chronic kidney disease, stage 3 unspecified: Secondary | ICD-10-CM | POA: Diagnosis not present

## 2020-01-18 LAB — BAYER DCA HB A1C WAIVED: HB A1C (BAYER DCA - WAIVED): 5.8 % (ref <7.0)

## 2020-01-18 MED ORDER — CYCLOBENZAPRINE HCL 10 MG PO TABS: 10.0000 mg | ORAL_TABLET | Freq: Three times a day (TID) | ORAL | 0 refills | Status: DC | PRN

## 2020-01-18 NOTE — Progress Notes (Signed)
BP 132/62 (BP Location: Left Arm, Patient Position: Sitting, Cuff Size: Normal)   Pulse 75   Temp 98.4 F (36.9 C) (Oral)   Ht 5' 4.17" (1.63 m)   Wt 176 lb 12.8 oz (80.2 kg)   SpO2 94%   BMI 30.18 kg/m    Subjective:    [name], female    DOB: [birth year], 74 y.o.   MRN: [id number]  HPI: Sarah White is a 74 y.o. female  Chief Complaint  Patient presents with  . COPD   Occasionally having chest pain. Feeling much better. She continues to be a bit SOB. She is following with cardiology and is scheduled to have a cardiac cath on Tuesday. He is using her inhalers. No coughing. No wheezing.   DIABETES Hypoglycemic episodes:no Polydipsia/polyuria: no Visual disturbance: no Chest pain: no Paresthesias: yes Glucose Monitoring: yes  Accucheck frequency: occasionally Taking Insulin?: no Blood Pressure Monitoring: not checking Retinal Examination: Up to Date Foot Exam: Up to Date Diabetic Education: Completed Pneumovax: Up to Date Influenza: Up to Date Aspirin: yes  Relevant past medical, surgical, family and social history reviewed and updated as indicated. Interim medical history since our last visit reviewed. Allergies and medications reviewed and updated.  Review of Systems  Constitutional: Negative.   Respiratory: Negative.   Cardiovascular: Negative.   Gastrointestinal: Negative.   Psychiatric/Behavioral: Negative.     Per HPI unless specifically indicated above     Objective:    BP 132/62 (BP Location: Left Arm, Patient Position: Sitting, Cuff Size: Normal)   Pulse 75   Temp 98.4 F (36.9 C) (Oral)   Ht 5' 4.17" (1.63 m)   Wt 176 lb 12.8 oz (80.2 kg)   SpO2 94%   BMI 30.18 kg/m   Wt Readings from Last 3 Encounters:  01/18/20 176 lb 12.8 oz (80.2 kg)  12/18/19 177 lb (80.3 kg)  12/18/19 177 lb (80.3 kg)    Physical Exam Vitals and nursing note reviewed.  Constitutional:      General: She is not in acute distress.  Appearance: Normal appearance. She is not ill-appearing, toxic-appearing or diaphoretic.  HENT:     Head: Normocephalic and atraumatic.     Right Ear: External ear normal.     Left Ear: External ear normal.     Nose: Nose normal.     Mouth/Throat:     Mouth: Mucous membranes are moist.     Pharynx: Oropharynx is clear.  Eyes:     General: No scleral icterus.       Right eye: No discharge.        Left eye: No discharge.     Extraocular Movements: Extraocular movements intact.     Conjunctiva/sclera: Conjunctivae normal.     Pupils: Pupils are equal, round, and reactive to light.  Cardiovascular:     Rate and Rhythm: Normal rate and regular rhythm.     Pulses: Normal pulses.     Heart sounds: Normal heart sounds. No murmur. No friction rub. No gallop.   Pulmonary:     Effort: Pulmonary effort is normal. No respiratory distress.     Breath sounds: Normal breath sounds. No stridor. No wheezing, rhonchi or rales.  Chest:     Chest wall: No tenderness.  Musculoskeletal:        General: Normal range of motion.     Cervical back: Normal range of motion and neck supple.  Skin:    General: Skin is warm and  dry.     Capillary Refill: Capillary refill takes less than 2 seconds.     Coloration: Skin is not jaundiced or pale.     Findings: No bruising, erythema, lesion or rash.  Neurological:     General: No focal deficit present.     Mental Status: She is alert and oriented to person, place, and time. Mental status is at baseline.  Psychiatric:        Mood and Affect: Mood normal.        Behavior: Behavior normal.        Thought Content: Thought content normal.        Judgment: Judgment normal.     Results for orders placed or performed in visit on 01/18/20  Bayer DCA Hb A1c Waived  Result Value Ref Range   HB A1C (BAYER DCA - WAIVED) 5.8 <7.0 %      Assessment & Plan:   Problem List Items Addressed This Visit      Cardiovascular and Mediastinum   CAD (coronary artery  disease)    Following with cardiology. Going to have cardiac cath on Tuesday. Continue to monitor. Call with any concerns.         Respiratory   COPD (chronic obstructive pulmonary disease) (HCC)    Under good control on current regimen. Continue current regimen. Continue to monitor. Call with any concerns. Refills given. Lungs clear today.         Endocrine   Diabetes mellitus with chronic kidney disease ([place]) - Primary    Under good control with A1c of 5.8. Continue current regimen. Continue to monitor. Call with any concerns.       Relevant Orders   Bayer DCA Hb A1c Waived (Completed)       Follow up plan: Return in about 3 months (around 04/19/2020).

## 2020-01-19 ENCOUNTER — Other Ambulatory Visit: Admission: RE | Admit: 2020-01-19 | Payer: Medicare Other | Source: Ambulatory Visit

## 2020-01-21 ENCOUNTER — Encounter: Payer: Self-pay | Admitting: Family Medicine

## 2020-01-21 NOTE — Assessment & Plan Note (Signed)
Under good control on current regimen. Continue current regimen. Continue to monitor. Call with any concerns. Refills given. Lungs clear today.

## 2020-01-21 NOTE — Assessment & Plan Note (Signed)
Following with cardiology. Going to have cardiac cath on Tuesday. Continue to monitor. Call with any concerns.

## 2020-01-21 NOTE — Assessment & Plan Note (Signed)
Under good control with A1c of 5.8. Continue current regimen. Continue to monitor. Call with any concerns.  

## 2020-01-22 ENCOUNTER — Other Ambulatory Visit
Admission: RE | Admit: 2020-01-22 | Discharge: 2020-01-22 | Disposition: A | Discharge status: Home / Self Care | Payer: Medicare Other | Source: Ambulatory Visit | Attending: Internal Medicine | Admitting: Internal Medicine

## 2020-01-22 ENCOUNTER — Other Ambulatory Visit: Payer: Self-pay

## 2020-01-22 DIAGNOSIS — Z20822 Contact with and (suspected) exposure to covid-19: Secondary | ICD-10-CM | POA: Diagnosis not present

## 2020-01-22 DIAGNOSIS — Z01812 Encounter for preprocedural laboratory examination: Secondary | ICD-10-CM | POA: Diagnosis not present

## 2020-01-22 LAB — SARS CORONAVIRUS 2 (TAT 6-24 HRS): SARS Coronavirus 2: NEGATIVE

## 2020-01-23 ENCOUNTER — Encounter
Admission: RE | Disposition: A | Discharge status: Home / Self Care | Payer: Self-pay | Source: Home / Self Care | Attending: Internal Medicine

## 2020-01-23 ENCOUNTER — Other Ambulatory Visit: Payer: Self-pay

## 2020-01-23 ENCOUNTER — Encounter: Payer: Self-pay | Admitting: Internal Medicine

## 2020-01-23 ENCOUNTER — Ambulatory Visit
Admission: RE | Admit: 2020-01-23 | Discharge: 2020-01-23 | Disposition: A | Discharge status: Home / Self Care | Payer: Medicare Other | Attending: Internal Medicine | Admitting: Internal Medicine

## 2020-01-23 DIAGNOSIS — I5020 Unspecified systolic (congestive) heart failure: Secondary | ICD-10-CM | POA: Diagnosis not present

## 2020-01-23 DIAGNOSIS — Z6830 Body mass index (BMI) 30.0-30.9, adult: Secondary | ICD-10-CM | POA: Insufficient documentation

## 2020-01-23 DIAGNOSIS — J449 Chronic obstructive pulmonary disease, unspecified: Secondary | ICD-10-CM | POA: Insufficient documentation

## 2020-01-23 DIAGNOSIS — E1122 Type 2 diabetes mellitus with diabetic chronic kidney disease: Secondary | ICD-10-CM | POA: Insufficient documentation

## 2020-01-23 DIAGNOSIS — I252 Old myocardial infarction: Secondary | ICD-10-CM | POA: Diagnosis not present

## 2020-01-23 DIAGNOSIS — I429 Cardiomyopathy, unspecified: Secondary | ICD-10-CM | POA: Insufficient documentation

## 2020-01-23 DIAGNOSIS — I130 Hypertensive heart and chronic kidney disease with heart failure and stage 1 through stage 4 chronic kidney disease, or unspecified chronic kidney disease: Secondary | ICD-10-CM | POA: Diagnosis not present

## 2020-01-23 DIAGNOSIS — I5022 Chronic systolic (congestive) heart failure: Secondary | ICD-10-CM | POA: Diagnosis not present

## 2020-01-23 DIAGNOSIS — E669 Obesity, unspecified: Secondary | ICD-10-CM | POA: Diagnosis not present

## 2020-01-23 DIAGNOSIS — Z79899 Other long term (current) drug therapy: Secondary | ICD-10-CM | POA: Diagnosis not present

## 2020-01-23 DIAGNOSIS — I2582 Chronic total occlusion of coronary artery: Secondary | ICD-10-CM | POA: Diagnosis not present

## 2020-01-23 DIAGNOSIS — Z87891 Personal history of nicotine dependence: Secondary | ICD-10-CM | POA: Insufficient documentation

## 2020-01-23 DIAGNOSIS — Z7982 Long term (current) use of aspirin: Secondary | ICD-10-CM | POA: Insufficient documentation

## 2020-01-23 DIAGNOSIS — E785 Hyperlipidemia, unspecified: Secondary | ICD-10-CM | POA: Diagnosis not present

## 2020-01-23 DIAGNOSIS — I209 Angina pectoris, unspecified: Secondary | ICD-10-CM | POA: Diagnosis not present

## 2020-01-23 DIAGNOSIS — Z794 Long term (current) use of insulin: Secondary | ICD-10-CM | POA: Diagnosis not present

## 2020-01-23 DIAGNOSIS — N182 Chronic kidney disease, stage 2 (mild): Secondary | ICD-10-CM | POA: Insufficient documentation

## 2020-01-23 DIAGNOSIS — I2510 Atherosclerotic heart disease of native coronary artery without angina pectoris: Secondary | ICD-10-CM | POA: Diagnosis not present

## 2020-01-23 HISTORY — PX: LEFT HEART CATH AND CORONARY ANGIOGRAPHY: CATH118249

## 2020-01-23 LAB — GLUCOSE, CAPILLARY: Glucose-Capillary: 86 mg/dL (ref 70–99)

## 2020-01-23 SURGERY — LEFT HEART CATH AND CORONARY ANGIOGRAPHY
Anesthesia: Moderate Sedation

## 2020-01-23 SURGERY — RIGHT/LEFT HEART CATH AND CORONARY ANGIOGRAPHY
Anesthesia: Moderate Sedation

## 2020-01-23 MED ORDER — ASPIRIN 81 MG PO CHEW
CHEWABLE_TABLET | ORAL | Status: AC
  Filled 2020-01-23: qty 1

## 2020-01-23 MED ORDER — SODIUM CHLORIDE 0.9% FLUSH: 3.0000 mL | Freq: Two times a day (BID) | INTRAVENOUS | Status: DC

## 2020-01-23 MED ORDER — FENTANYL CITRATE (PF) 100 MCG/2ML IJ SOLN
INTRAMUSCULAR | Status: DC | PRN
  Administered 2020-01-23: 25 ug via INTRAVENOUS

## 2020-01-23 MED ORDER — MIDAZOLAM HCL 2 MG/2ML IJ SOLN
INTRAMUSCULAR | Status: DC | PRN
  Administered 2020-01-23: 1 mg via INTRAVENOUS

## 2020-01-23 MED ORDER — IOHEXOL 300 MG/ML  SOLN
INTRAMUSCULAR | Status: DC | PRN
  Administered 2020-01-23: 75 mL

## 2020-01-23 MED ORDER — SODIUM CHLORIDE 0.9 % WEIGHT BASED INFUSION: 1.0000 mL/kg/h | INTRAVENOUS | Status: DC

## 2020-01-23 MED ORDER — FENTANYL CITRATE (PF) 100 MCG/2ML IJ SOLN
INTRAMUSCULAR | Status: AC
  Filled 2020-01-23: qty 2

## 2020-01-23 MED ORDER — SODIUM CHLORIDE 0.9 % WEIGHT BASED INFUSION
3.0000 mL/kg/h | INTRAVENOUS | Status: AC
  Administered 2020-01-23: 3 mL/kg/h via INTRAVENOUS

## 2020-01-23 MED ORDER — SODIUM CHLORIDE 0.9% FLUSH: 3.0000 mL | INTRAVENOUS | Status: DC | PRN

## 2020-01-23 MED ORDER — HEPARIN (PORCINE) IN NACL 1000-0.9 UT/500ML-% IV SOLN
INTRAVENOUS | Status: DC | PRN
  Administered 2020-01-23: 500 mL

## 2020-01-23 MED ORDER — MIDAZOLAM HCL 2 MG/2ML IJ SOLN
INTRAMUSCULAR | Status: AC
  Filled 2020-01-23: qty 2

## 2020-01-23 MED ORDER — ASPIRIN 81 MG PO CHEW
81.0000 mg | CHEWABLE_TABLET | ORAL | Status: AC
  Administered 2020-01-23: 81 mg via ORAL

## 2020-01-23 MED ORDER — SODIUM CHLORIDE 0.9 % IV SOLN: 250.0000 mL | INTRAVENOUS | Status: DC | PRN

## 2020-01-23 MED ORDER — HEPARIN (PORCINE) IN NACL 1000-0.9 UT/500ML-% IV SOLN
INTRAVENOUS | Status: AC
  Filled 2020-01-23: qty 1000

## 2020-01-23 SURGICAL SUPPLY — 10 items
CATH INFINITI 5FR ANG PIGTAIL (CATHETERS) IMPLANT
CATH INFINITI 5FR JL4 (CATHETERS) ×2 IMPLANT
CATH INFINITI JR4 5F (CATHETERS) ×2 IMPLANT
KIT MANI 3VAL PERCEP (MISCELLANEOUS) ×3 IMPLANT
NDL PERC 18GX7CM (NEEDLE) IMPLANT
NEEDLE PERC 18GX7CM (NEEDLE) ×3 IMPLANT
PACK CARDIAC CATH (CUSTOM PROCEDURE TRAY) ×3 IMPLANT
SHEATH AVANTI 5FR X 11CM (SHEATH) ×2 IMPLANT
SHEATH AVANTI 7FRX11 (SHEATH) IMPLANT
WIRE GUIDERIGHT .035X150 (WIRE) ×2 IMPLANT

## 2020-01-23 NOTE — Discharge Instructions (Signed)
Angiogram, Care After This sheet gives you information about how to care for yourself after your procedure. Your doctor may also give you more specific instructions. If you have problems or questions, contact your doctor. Follow these instructions at home: Insertion site care  Follow instructions from your doctor about how to take care of your long, thin tube (catheter) insertion area. Make sure you: ? Wash your hands with soap and water before you change your bandage (dressing). If you cannot use soap and water, use hand sanitizer. ? Change your bandage as told by your doctor. ? Leave stitches (sutures), skin glue, or skin tape (adhesive) strips in place. They may need to stay in place for 2 weeks or longer. If tape strips get loose and curl up, you may trim the loose edges. Do not remove tape strips completely unless your doctor says it is okay.  Do not take baths, swim, or use a hot tub until your doctor says it is okay.  You may shower 24-48 hours after the procedure or as told by your doctor. ? Gently wash the area with plain soap and water. ? Pat the area dry with a clean towel. ? Do not rub the area. This may cause bleeding.  Do not apply powder or lotion to the area. Keep the area clean and dry.  Check your insertion area every day for signs of infection. Check for: ? More redness, swelling, or pain. ? Fluid or blood. ? Warmth. ? Pus or a bad smell. Activity  Rest as told by your doctor, usually for 1-2 days.  Do not lift anything that is heavier than 10 lbs. (4.5 kg) or as told by your doctor.  Do not drive for 24 hours if you were given a medicine to help you relax (sedative).  Do not drive or use heavy machinery while taking prescription pain medicine. General instructions   Go back to your normal activities as told by your doctor, usually in about a week. Ask your doctor what activities are safe for you.  If the insertion area starts to bleed, lie flat and put  pressure on the area. If the bleeding does not stop, get help right away. This is an emergency.  Drink enough fluid to keep your pee (urine) clear or pale yellow.  Take over-the-counter and prescription medicines only as told by your doctor.  Keep all follow-up visits as told by your doctor. This is important. Contact a doctor if:  You have a fever.  You have chills.  You have more redness, swelling, or pain around your insertion area.  You have fluid or blood coming from your insertion area.  The insertion area feels warm to the touch.  You have pus or a bad smell coming from your insertion area.  You have more bruising around the insertion area.  Blood collects in the tissue around the insertion area (hematoma) that may be painful to the touch. Get help right away if:  You have a lot of pain in the insertion area.  The insertion area swells very fast.  The insertion area is bleeding, and the bleeding does not stop after holding steady pressure on the area.  The area near or just beyond the insertion area becomes pale, cool, tingly, or numb. These symptoms may be an emergency. Do not wait to see if the symptoms will go away. Get medical help right away. Call your local emergency services (911 in the [place] Do not drive yourself to the hospital.   Summary  After the procedure, it is common to have bruising and tenderness at the long, thin tube insertion area.  After the procedure, it is important to rest and drink plenty of fluids.  Do not take baths, swim, or use a hot tub until your doctor says it is okay to do so. You may shower 24-48 hours after the procedure or as told by your doctor.  If the insertion area starts to bleed, lie flat and put pressure on the area. If the bleeding does not stop, get help right away. This is an emergency. This information is not intended to replace advice given to you by your health care provider. Make sure you discuss any questions you have  with your health care provider. Document Revised: 07/30/2017 Document Reviewed: 08/11/2016 Elsevier Patient Education  2020 [employer]   Femoral Site Care This sheet gives you information about how to care for yourself after your procedure. Your health care provider may also give you more specific instructions. If you have problems or questions, contact your health care provider. What can I expect after the procedure? After the procedure, it is common to have:  Bruising that usually fades within 1-2 weeks.  Tenderness at the site. Follow these instructions at home: Wound care  Follow instructions from your health care provider about how to take care of your insertion site. Make sure you: ? Wash your hands with soap and water before you change your bandage (dressing). If soap and water are not available, use hand sanitizer. ? Change your dressing as told by your health care provider. ? Leave stitches (sutures), skin glue, or adhesive strips in place. These skin closures may need to stay in place for 2 weeks or longer. If adhesive strip edges start to loosen and curl up, you may trim the loose edges. Do not remove adhesive strips completely unless your health care provider tells you to do that.  Do not take baths, swim, or use a hot tub until your health care provider approves.  You may shower 24-48 hours after the procedure or as told by your health care provider. ? Gently wash the site with plain soap and water. ? Pat the area dry with a clean towel. ? Do not rub the site. This may cause bleeding.  Do not apply powder or lotion to the site. Keep the site clean and dry.  Check your femoral site every day for signs of infection. Check for: ? Redness, swelling, or pain. ? Fluid or blood. ? Warmth. ? Pus or a bad smell. Activity  For the first 2-3 days after your procedure, or as long as directed: ? Avoid climbing stairs as much as possible. ? Do not squat.  Do not lift  anything that is heavier than 10 lb (4.5 kg), or the limit that you are told, until your health care provider says that it is safe.  Rest as directed. ? Avoid sitting for a long time without moving. Get up to take short walks every 1-2 hours.  Do not drive for 24 hours if you were given a medicine to help you relax (sedative). General instructions  Take over-the-counter and prescription medicines only as told by your health care provider.  Keep all follow-up visits as told by your health care provider. This is important. Contact a health care provider if you have:  A fever or chills.  You have redness, swelling, or pain around your insertion site. Get help right away if:  The catheter   insertion area swells very fast.  You pass out.  You suddenly start to sweat or your skin gets clammy.  The catheter insertion area is bleeding, and the bleeding does not stop when you hold steady pressure on the area.  The area near or just beyond the catheter insertion site becomes pale, cool, tingly, or numb. These symptoms may represent a serious problem that is an emergency. Do not wait to see if the symptoms will go away. Get medical help right away. Call your local emergency services (911 in the [place] Do not drive yourself to the hospital. Summary  After the procedure, it is common to have bruising that usually fades within 1-2 weeks.  Check your femoral site every day for signs of infection.  Do not lift anything that is heavier than 10 lb (4.5 kg), or the limit that you are told, until your health care provider says that it is safe. This information is not intended to replace advice given to you by your health care provider. Make sure you discuss any questions you have with your health care provider. Document Revised: 08/30/2017 Document Reviewed: 08/30/2017 Elsevier Patient Education  2020 [employer] Inc[employer]   Moderate Conscious Sedation, Adult, Care After These instructions provide you  with information about caring for yourself after your procedure. Your health care provider may also give you more specific instructions. Your treatment has been planned according to current medical practices, but problems sometimes occur. Call your health care provider if you have any problems or questions after your procedure. What can I expect after the procedure? After your procedure, it is common:  To feel sleepy for several hours.  To feel clumsy and have poor balance for several hours.  To have poor judgment for several hours.  To vomit if you eat too soon. Follow these instructions at home: For at least 24 hours after the procedure:   Do not: ? Participate in activities where you could fall or become injured. ? Drive. ? Use heavy machinery. ? Drink alcohol. ? Take sleeping pills or medicines that cause drowsiness. ? Make important decisions or sign legal documents. ? Take care of children on your own.  Rest. Eating and drinking  Follow the diet recommended by your health care provider.  If you vomit: ? Drink water, juice, or soup when you can drink without vomiting. ? Make sure you have little or no nausea before eating solid foods. General instructions  Have a responsible adult stay with you until you are awake and alert.  Take over-the-counter and prescription medicines only as told by your health care provider.  If you smoke, do not smoke without supervision.  Keep all follow-up visits as told by your health care provider. This is important. Contact a health care provider if:  You keep feeling nauseous or you keep vomiting.  You feel light-headed.  You develop a rash.  You have a fever. Get help right away if:  You have trouble breathing. This information is not intended to replace advice given to you by your health care provider. Make sure you discuss any questions you have with your health care provider. Document Revised: 07/30/2017 Document Reviewed:  12/07/2015 Elsevier Patient Education  2020 Elsevier Inc.  

## 2020-01-24 ENCOUNTER — Encounter: Payer: Self-pay | Admitting: Cardiology

## 2020-01-24 ENCOUNTER — Ambulatory Visit: Payer: Self-pay | Admitting: General Practice

## 2020-01-24 ENCOUNTER — Telehealth: Payer: Medicare Other | Admitting: General Practice

## 2020-01-24 DIAGNOSIS — I5021 Acute systolic (congestive) heart failure: Secondary | ICD-10-CM

## 2020-01-24 DIAGNOSIS — J449 Chronic obstructive pulmonary disease, unspecified: Secondary | ICD-10-CM | POA: Diagnosis not present

## 2020-01-24 DIAGNOSIS — E1122 Type 2 diabetes mellitus with diabetic chronic kidney disease: Secondary | ICD-10-CM | POA: Diagnosis not present

## 2020-01-24 DIAGNOSIS — F17200 Nicotine dependence, unspecified, uncomplicated: Secondary | ICD-10-CM

## 2020-01-24 DIAGNOSIS — E782 Mixed hyperlipidemia: Secondary | ICD-10-CM | POA: Diagnosis not present

## 2020-01-24 DIAGNOSIS — F321 Major depressive disorder, single episode, moderate: Secondary | ICD-10-CM

## 2020-01-24 DIAGNOSIS — N183 Chronic kidney disease, stage 3 unspecified: Secondary | ICD-10-CM | POA: Diagnosis not present

## 2020-01-24 DIAGNOSIS — I2510 Atherosclerotic heart disease of native coronary artery without angina pectoris: Secondary | ICD-10-CM | POA: Diagnosis not present

## 2020-01-24 DIAGNOSIS — F419 Anxiety disorder, unspecified: Secondary | ICD-10-CM

## 2020-01-24 NOTE — Patient Instructions (Signed)
Visit Information  Goals Addressed            This Visit's Progress    RNCM: Pt-"They said it happened because of the fluid around my heart" (pt-stated)       CARE PLAN ENTRY (see longtitudinal plan of care for additional care plan information)  Current Barriers:   Chronic Disease Management support, education, and care coordination needs related to CHF, CAD, HLD, COPD, DMII, Anxiety, and Depression  Clinical Goal(s) related to CHF, CAD, HLD, COPD, DMII, Anxiety, and Depression:  Over the next 120 days, patient will:   Work with the care management team to address educational, disease management, and care coordination needs   Begin or continue self health monitoring activities as directed today Measure and record cbg (blood glucose) 3 times weekly or more if taking steroids, Measure and record blood pressure 5 times per week, Measure and record weight daily, and adhere to a heart healthy/ADA diet  Call provider office for new or worsened signs and symptoms Blood glucose findings outside established parameters, Blood pressure findings outside established parameters, Weight outside established parameters, Oxygen saturation lower than established parameter, Chest pain, Shortness of breath, and New or worsened symptom related to depression and other chronic conditions  Call care management team with questions or concerns  Verbalize basic understanding of patient centered plan of care established today  Interventions related to CHF, CAD, HLD, COPD, DMII, Anxiety, and Depression:   Evaluation of current treatment plans and patient's adherence to plan as established by provider. The patient had a heart catheterization on 01/23/2020.  She is doing well. Has blockages but states she is not going to elect to have surgery. The patient verbalized she goes back to see Dr. Clayborn Bigness on 01-31-2020 and will discuss treatment options.   She had called the office yesterday because her leg was numb.  They  instructed her to "walk it out".  The patient denies any new issues with numbness of leg today.  The patient was getting ready to call the office today because she was reading her instructions on caring for the insertion site and was confused. The discharge instructions AVS was pulled up and instructions provided to the patient on her level of understanding. The patient states she understands how to care for the site now. The patient states the gauze is dry and intact. Review of signs and symptoms of infection and to call Dr. Clayborn Bigness for changes in site or worsening condition.   Assessed patient understanding of disease states.  The patient has a good understanding of her chronic conditions. The patient states she did not have changes in her weight at home. Review of her normal heart failure signs and symptoms. The patient has not weighed today but her weight is staying around 176.  The patient usually waits until later in the morning to weigh because of her balance first thing in the am.   Assessed patient's education and care coordination needs.  The patient has several upcoming appointments but has them all written down. The patient has to see a vein specialist because she has bilateral blockages in her carotid arteries. The patient will make an appointment to see the specialist.  The patient has a follow up appointment with Dr. Clayborn Bigness on 01-31-2020.    Provided disease specific education to patient.  Education on watching sodium content of foods. The patient verbalized she is doing a better job at this. The patient has only been checking her blood sugars every 3 days.  The patient had not checked her blood sugar this am but yesterday before her heart catheterization it was 94.  Education on s/s of hypoglycemia/hyperglycemia and checking more frequently if she feels differently.  The patient blood pressure is stable. She is weighing daily and also she is taking her blood pressure regularly and recording.    Collaborated with appropriate clinical care team members regarding patient needs.  Will refer LCSW for assistance with stressors and ways to relieve anxiety to reduce the incidence of smoking. Also the patient feels she is being a burden to her son. She denies suicidal ideation but says he has put his life on hold because of her. Education and support given today.  On going support and education from pharmacy for questions/concerns related to new medications. Will continue to monitor for changes.   Review of upcoming appointments: Sees Dr. Clayborn Bigness on 01-31-2020 and Dr. Wynetta Emery on 04-19-2020  CCM team numbers and roles discussed with the patient and she agrees to work with the team.   Patient Self Care Activities related to CHF, CAD, HLD, COPD, DMII, Anxiety, and Depression:   Patient is unable to independently self-manage chronic health conditions  Please see past updates related to this goal by clicking on the "Past Updates" button in the selected goal         Patient verbalizes understanding of instructions provided today.   The care management team will reach out to the patient again over the next 30 to 60 days.   Noreene Larsson RN, MSN, [facility] Mobile: [phone]

## 2020-01-24 NOTE — Chronic Care Management (AMB) (Signed)
Chronic Care Management   Follow Up Note   01/24/2020 Name: Sarah White MRN: [id number] DOB: [birth year]  Referred by: Valerie Roys, DO Reason for referral : Chronic Care Management (Follow up: RNCM Chronic Disease Management and Care Coordination Needs)   Sarah White is a 74 y.o. year old female who is a primary care patient of Valerie Roys, DO. The CCM team was consulted for assistance with chronic disease management and care coordination needs.    Review of patient status, including review of consultants reports, relevant laboratory and other test results, and collaboration with appropriate care team members and the patient's provider was performed as part of comprehensive patient evaluation and provision of chronic care management services.    SDOH (Social Determinants of Health) assessments performed: Yes See Care Plan activities for detailed interventions related to [facility])     Outpatient Encounter Medications as of 01/24/2020  Medication Sig  . albuterol (VENTOLIN HFA) 108 (90 Base) MCG/ACT inhaler Inhale 2 puffs into the lungs every 6 (six) hours as needed for wheezing or shortness of breath.  Marland Kitchen amiodarone (PACERONE) 200 MG tablet Take 100 mg by mouth daily.   Marland Kitchen aspirin EC 81 MG tablet Take 81 mg by mouth at bedtime.   . budesonide-formoterol (SYMBICORT) 160-4.5 MCG/ACT inhaler Inhale 2 puffs into the lungs 2 (two) times daily.  . carvedilol (COREG) 12.5 MG tablet TAKE 2 TABLETS BY MOUTH TWICE DAILY WITH A MEAL (Patient taking differently: Take 25 mg by mouth 2 (two) times daily with a meal. )  . cyclobenzaprine (FLEXERIL) 10 MG tablet Take 1 tablet (10 mg total) by mouth 3 (three) times daily as needed for muscle spasms. DO NOT DRIVE ON THIS MEDICINE- WILL MAKE YOU SLEEPY  . diphenhydrAMINE (BENADRYL) 25 MG tablet Take 25 mg by mouth every 6 (six) hours as needed for allergies.   . Dulaglutide (TRULICITY) 1.5 0000000 SOPN Inject 1.5 mg into the skin once a week.  .  furosemide (LASIX) 40 MG tablet Take 0.5 tablets by mouth daily.  . isosorbide mononitrate (IMDUR) 60 MG 24 hr tablet Take 1 tablet (60 mg total) by mouth daily.  . Lidocaine 4 % PTCH Apply 1 patch topically every 12 (twelve) hours. (Patient taking differently: Apply 1 patch topically daily as needed (pain). )  . meclizine (ANTIVERT) 12.5 MG tablet Take 1 tablet (12.5 mg total) by mouth 3 (three) times daily as needed for dizziness.  . Multiple Vitamin (MULTIVITAMIN WITH MINERALS) TABS tablet Take 1 tablet by mouth daily.  . nitroGLYCERIN (NITROSTAT) 0.4 MG SL tablet Place 1 tablet (0.4 mg total) under the tongue every 5 (five) minutes as needed for chest pain.  Marland Kitchen ondansetron (ZOFRAN-ODT) 4 MG disintegrating tablet Take 4 mg by mouth every 8 (eight) hours as needed for nausea or vomiting.  . rosuvastatin (CRESTOR) 20 MG tablet Take 1 tablet (20 mg total) by mouth daily.  . sacubitril-valsartan (ENTRESTO) 24-26 MG Take 1 tablet by mouth 2 (two) times daily.  . sertraline (ZOLOFT) 100 MG tablet Take 2 tablets (200 mg total) by mouth daily.  Marland Kitchen tiotropium (SPIRIVA HANDIHALER) 18 MCG inhalation capsule Place 1 capsule (18 mcg total) into inhaler and inhale daily.  . traZODone (DESYREL) 50 MG tablet TAKE 1/2 TO 1 (ONE-HALF TO ONE) TABLET BY MOUTH AT BEDTIME AS NEEDED FOR SLEEP (Patient taking differently: Take 50 mg by mouth at bedtime. )  . triamcinolone ointment (KENALOG) 0.5 % Apply 1 application topically 2 (two) times daily.  No facility-administered encounter medications on file as of 01/24/2020.     Objective:  BP Readings from Last 3 Encounters:  01/23/20 (!) 116/58  01/18/20 132/62  12/18/19 109/60    Goals Addressed            This Visit's Progress   . RNCM: Pt-"They said it happened because of the fluid around my heart" (pt-stated)       CARE PLAN ENTRY (see longtitudinal plan of care for additional care plan information)  Current Barriers:  . Chronic Disease Management  support, education, and care coordination needs related to CHF, CAD, HLD, COPD, DMII, Anxiety, and Depression  Clinical Goal(s) related to CHF, CAD, HLD, COPD, DMII, Anxiety, and Depression:  Over the next 120 days, patient will:  . Work with the care management team to address educational, disease management, and care coordination needs  . Begin or continue self health monitoring activities as directed today Measure and record cbg (blood glucose) 3 times weekly or more if taking steroids, Measure and record blood pressure 5 times per week, Measure and record weight daily, and adhere to a heart healthy/ADA diet . Call provider office for new or worsened signs and symptoms Blood glucose findings outside established parameters, Blood pressure findings outside established parameters, Weight outside established parameters, Oxygen saturation lower than established parameter, Chest pain, Shortness of breath, and New or worsened symptom related to depression and other chronic conditions . Call care management team with questions or concerns . Verbalize basic understanding of patient centered plan of care established today  Interventions related to CHF, CAD, HLD, COPD, DMII, Anxiety, and Depression:  . Evaluation of current treatment plans and patient's adherence to plan as established by provider. The patient had a heart catheterization on 01/23/2020.  She is doing well. Has blockages but states she is not going to elect to have surgery. The patient verbalized she goes back to see Dr. Clayborn Bigness on 01-31-2020 and will discuss treatment options.   She had called the office yesterday because her leg was numb.  They instructed her to "walk it out".  The patient denies any new issues with numbness of leg today.  The patient was getting ready to call the office today because she was reading her instructions on caring for the insertion site and was confused. The discharge instructions AVS was pulled up and instructions  provided to the patient on her level of understanding. The patient states she understands how to care for the site now. The patient states the gauze is dry and intact. Review of signs and symptoms of infection and to call Dr. Clayborn Bigness for changes in site or worsening condition.  . Assessed patient understanding of disease states.  The patient has a good understanding of her chronic conditions. The patient states she did not have changes in her weight at home. Review of her normal heart failure signs and symptoms. The patient has not weighed today but her weight is staying around 176.  The patient usually waits until later in the morning to weigh because of her balance first thing in the am.  . Assessed patient's education and care coordination needs.  The patient has several upcoming appointments but has them all written down. The patient has to see a vein specialist because she has bilateral blockages in her carotid arteries. The patient will make an appointment to see the specialist.  The patient has a follow up appointment with Dr. Clayborn Bigness on 01-31-2020.   Marland Kitchen Provided disease specific education to  patient.  Education on watching sodium content of foods. The patient verbalized she is doing a better job at this. The patient has only been checking her blood sugars every 3 days. The patient had not checked her blood sugar this am but yesterday before her heart catheterization it was 94.  Education on s/s of hypoglycemia/hyperglycemia and checking more frequently if she feels differently.  The patient blood pressure is stable. She is weighing daily and also she is taking her blood pressure regularly and recording.  Nash Dimmer with appropriate clinical care team members regarding patient needs.  Will refer LCSW for assistance with stressors and ways to relieve anxiety to reduce the incidence of smoking. Also the patient feels she is being a burden to her son. She denies suicidal ideation but says he has put his  life on hold because of her. Education and support given today.  On going support and education from pharmacy for questions/concerns related to new medications. Will continue to monitor for changes.  . Review of upcoming appointments: Sees Dr. Clayborn Bigness on 01-31-2020 and Dr. Wynetta Emery on 04-19-2020 . CCM team numbers and roles discussed with the patient and she agrees to work with the team.   Patient Self Care Activities related to CHF, CAD, HLD, COPD, DMII, Anxiety, and Depression:  . Patient is unable to independently self-manage chronic health conditions  Please see past updates related to this goal by clicking on the "Past Updates" button in the selected goal          Plan:   The care management team will reach out to the patient again over the next 30 to 60 days.    Noreene Larsson RN, MSN, [facility] Mobile: [phone]

## 2020-01-31 ENCOUNTER — Ambulatory Visit: Payer: Self-pay | Admitting: Pharmacist

## 2020-01-31 NOTE — Chronic Care Management (AMB) (Signed)
°  Chronic Care Management   Note  01/31/2020 Name: EIMAN EHRGOTT MRN: [id number] DOB: February 04, 1946  ROSANN TRUEBA is a 74 y.o. year old female who is a primary care patient of Valerie Roys, DO. The CCM team was consulted for assistance with chronic disease management and care coordination needs.    Received call from patient that her Entresto dose was increased, and she needed to know how to get this to the [employer] patient assistance. Instructed her to call Dr. Etta Quill office and have them fax the updated dose to [employer] [facility] at [phone].  [name], PharmD, [place] [phone]

## 2020-02-01 ENCOUNTER — Encounter (INDEPENDENT_AMBULATORY_CARE_PROVIDER_SITE_OTHER): Payer: Self-pay | Admitting: Vascular Surgery

## 2020-02-01 ENCOUNTER — Ambulatory Visit (INDEPENDENT_AMBULATORY_CARE_PROVIDER_SITE_OTHER): Payer: Medicare Other | Admitting: Vascular Surgery

## 2020-02-01 ENCOUNTER — Other Ambulatory Visit: Payer: Self-pay

## 2020-02-01 VITALS — BP 109/62 | HR 77 | Resp 16 | Ht 64.0 in | Wt 174.4 lb

## 2020-02-01 DIAGNOSIS — J449 Chronic obstructive pulmonary disease, unspecified: Secondary | ICD-10-CM | POA: Diagnosis not present

## 2020-02-01 DIAGNOSIS — I2510 Atherosclerotic heart disease of native coronary artery without angina pectoris: Secondary | ICD-10-CM

## 2020-02-01 DIAGNOSIS — I6523 Occlusion and stenosis of bilateral carotid arteries: Secondary | ICD-10-CM | POA: Diagnosis not present

## 2020-02-01 DIAGNOSIS — R55 Syncope and collapse: Secondary | ICD-10-CM | POA: Diagnosis not present

## 2020-02-01 DIAGNOSIS — E782 Mixed hyperlipidemia: Secondary | ICD-10-CM | POA: Diagnosis not present

## 2020-02-01 DIAGNOSIS — I208 Other forms of angina pectoris: Secondary | ICD-10-CM | POA: Diagnosis not present

## 2020-02-04 ENCOUNTER — Encounter (INDEPENDENT_AMBULATORY_CARE_PROVIDER_SITE_OTHER): Payer: Self-pay | Admitting: Vascular Surgery

## 2020-02-04 DIAGNOSIS — I6529 Occlusion and stenosis of unspecified carotid artery: Secondary | ICD-10-CM | POA: Insufficient documentation

## 2020-02-04 NOTE — Progress Notes (Signed)
MRN : [id number]  Sarah White is a 74 y.o. (August 20, 1946) female who presents with chief complaint of  Chief Complaint  Patient presents with  . New Patient (Initial Visit)    ref [place] 90%carotid stenosis  .  History of Present Illness:   The patient is seen for evaluation of carotid stenosis. The carotid stenosis was identified after a duplex ultrasound was ordered for several syncopal episodes.  Duplex ultrasound showed a critical stenosis of the carotid artery.  The patient denies amaurosis fugax. There is a history of TIA symptoms or focal motor deficits with weakness of her legs and arm. There is no prior documented CVA.  There is no history of migraine headaches. There is no history of seizures.  The patient is taking enteric-coated aspirin 81 mg daily.  The patient has a history of coronary artery disease, no recent episodes of angina or shortness of breath. The patient denies PAD or claudication symptoms. There is a history of hyperlipidemia which is being treated with a statin.    Current Meds  Medication Sig  . albuterol (VENTOLIN HFA) 108 (90 Base) MCG/ACT inhaler Inhale 2 puffs into the lungs every 6 (six) hours as needed for wheezing or shortness of breath.  Marland Kitchen amiodarone (PACERONE) 200 MG tablet Take 100 mg by mouth daily.   Marland Kitchen aspirin EC 81 MG tablet Take 81 mg by mouth at bedtime.   . budesonide-formoterol (SYMBICORT) 160-4.5 MCG/ACT inhaler Inhale 2 puffs into the lungs 2 (two) times daily.  . carvedilol (COREG) 12.5 MG tablet TAKE 2 TABLETS BY MOUTH TWICE DAILY WITH A MEAL (Patient taking differently: Take 25 mg by mouth 2 (two) times daily with a meal. )  . cyclobenzaprine (FLEXERIL) 10 MG tablet Take 1 tablet (10 mg total) by mouth 3 (three) times daily as needed for muscle spasms. DO NOT DRIVE ON THIS MEDICINE- WILL MAKE YOU SLEEPY  . diphenhydrAMINE (BENADRYL) 25 MG tablet Take 25 mg by mouth every 6 (six) hours as needed for allergies.   . Dulaglutide  (TRULICITY) 1.5 EZ/6.6QH SOPN Inject 1.5 mg into the skin once a week.  . furosemide (LASIX) 40 MG tablet Take 0.5 tablets by mouth daily.  . isosorbide mononitrate (IMDUR) 60 MG 24 hr tablet Take 1 tablet (60 mg total) by mouth daily.  . Lidocaine 4 % PTCH Apply 1 patch topically every 12 (twelve) hours. (Patient taking differently: Apply 1 patch topically daily as needed (pain). )  . meclizine (ANTIVERT) 12.5 MG tablet Take 1 tablet (12.5 mg total) by mouth 3 (three) times daily as needed for dizziness.  . Multiple Vitamin (MULTIVITAMIN WITH MINERALS) TABS tablet Take 1 tablet by mouth daily.  . nitroGLYCERIN (NITROSTAT) 0.4 MG SL tablet Place 1 tablet (0.4 mg total) under the tongue every 5 (five) minutes as needed for chest pain.  Marland Kitchen ondansetron (ZOFRAN-ODT) 4 MG disintegrating tablet Take 4 mg by mouth every 8 (eight) hours as needed for nausea or vomiting.  . rosuvastatin (CRESTOR) 20 MG tablet Take 1 tablet (20 mg total) by mouth daily.  . sacubitril-valsartan (ENTRESTO) 49-51 MG Take 1 tablet by mouth 2 (two) times daily.   . sertraline (ZOLOFT) 100 MG tablet Take 2 tablets (200 mg total) by mouth daily.  Marland Kitchen tiotropium (SPIRIVA HANDIHALER) 18 MCG inhalation capsule Place 1 capsule (18 mcg total) into inhaler and inhale daily.  . traZODone (DESYREL) 50 MG tablet TAKE 1/2 TO 1 (ONE-HALF TO ONE) TABLET BY MOUTH AT BEDTIME AS NEEDED FOR  SLEEP (Patient taking differently: Take 50 mg by mouth at bedtime. )  . triamcinolone ointment (KENALOG) 0.5 % Apply 1 application topically 2 (two) times daily.    Past Medical History:  Diagnosis Date  . Acute pancreatitis 09/09/2018  . Acute respiratory failure with hypoxia and hypercarbia ([place]) 07/22/2015  . Cancer ([place])    uterine  . CHF (congestive heart failure) ([place])   . COPD (chronic obstructive pulmonary disease) ([place])   . Diabetes mellitus without complication ([place])   . Encephalopathy acute 07/22/2015  . Hypertension   . Pneumonia    November  2016  . Pulmonary edema 07/22/2015  . 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 ABDOMINAL HYSTERECTOMY    . CARPAL TUNNEL RELEASE Bilateral   . CESAREAN SECTION    . CHOLECYSTECTOMY N/A 09/12/2018   Procedure: LAPAROSCOPIC CHOLECYSTECTOMY WITH INTRAOPERATIVE CHOLANGIOGRAM;  Surgeon: Herbert Pun, MD;  Location: [facility] ORS;  Service: General;  Laterality: N/A;  . CORONARY ANGIOPLASTY WITH STENT PLACEMENT    . KNEE SURGERY Left   . LEFT HEART CATH AND CORONARY ANGIOGRAPHY Right 11/26/2016   Procedure: Left Heart Cath and Coronary Angiography;  Surgeon: Isaias Cowman, MD;  Location: [facility];  Service: Cardiovascular;  Laterality: Right;  . LEFT HEART CATH AND CORONARY ANGIOGRAPHY N/A 01/23/2020   Procedure: LEFT HEART CATH AND CORONARY ANGIOGRAPHY;  Surgeon: Yolonda Kida, MD;  Location: [facility];  Service: Cardiovascular;  Laterality: N/A;    Social History Social History   Tobacco Use  . Smoking status: Current Every Day Smoker    Packs/day: 0.50    Types: Cigarettes  . Smokeless tobacco: Never Used  Substance Use Topics  . Alcohol use: No  . Drug use: No    Family History Family History  Problem Relation Age of Onset  . Heart disease Mother   No family history of bleeding/clotting disorders, porphyria or autoimmune disease   Allergies  Allergen Reactions  . Amlodipine Swelling  . Atorvastatin Other (See Comments)    myalgia  . Codeine Nausea And Vomiting  . Hctz [Hydrochlorothiazide] Other (See Comments)    Hypokalemia  . Lisinopril Other (See Comments)    Dizziness   . Metformin And Related Nausea Only    Nausea, dizziness     REVIEW OF SYSTEMS (Negative unless checked)  Constitutional: [] Weight loss  [] Fever  [] Chills Cardiac: [x] Chest pain   [] Chest pressure   [] Palpitations   [] Shortness of breath when laying flat   [] Shortness of breath with  exertion. Vascular:  [] Pain in legs with walking   [] Pain in legs at rest  [] History of DVT   [] Phlebitis   [] Swelling in legs   [] Varicose veins   [] Non-healing ulcers Pulmonary:   [] Uses home oxygen   [] Productive cough   [] Hemoptysis   [] Wheeze  [] COPD   [] Asthma Neurologic:  [x] Dizziness   [] Seizures   [] History of stroke   [] History of TIA  [] Aphasia   [] Vissual changes   [x] Weakness or numbness in arm   [x] Weakness or numbness in leg Musculoskeletal:   [] Joint swelling   [] Joint pain   [] Low back pain Hematologic:  [] Easy bruising  [] Easy bleeding   [] Hypercoagulable state   [] Anemic Gastrointestinal:  [] Diarrhea   [] Vomiting  [] Gastroesophageal reflux/heartburn   [] Difficulty swallowing. Genitourinary:  [] Chronic kidney disease   [] Difficult urination  [] Frequent urination   [] Blood in urine Skin:  [] Rashes   []   Ulcers  Psychological:  [] History of anxiety   []  History of major depression.  Physical Examination  Vitals:   02/01/20 1414  BP: 109/62  Pulse: 77  Resp: 16  Weight: 174 lb 6.4 oz (79.1 kg)  Height: 5\' 4"  (1.626 m)   Body mass index is 29.94 kg/m. Gen: WD/WN, NAD Head: [place]/AT, No temporalis wasting.  Ear/Nose/Throat: Hearing grossly intact, nares w/o erythema or drainage, poor dentition Eyes: PER, EOMI, sclera nonicteric.  Neck: Supple, no masses.  No bruit or JVD.  Pulmonary:  Good air movement, clear to auscultation bilaterally, no use of accessory muscles.  Cardiac: Irreg Irreg R, normal S1, S2, no Murmurs. Vascular:  Bilateral carotid bruits Vessel Right Left  Radial Palpable Palpable  Brachial Palpable Palpable  Carotid Palpable Palpable  Gastrointestinal: soft, non-distended. No guarding/no peritoneal signs.  Musculoskeletal: M/S 5/5 throughout.  No deformity or atrophy.  Neurologic: CN 2-12 intact. Pain and light touch intact in extremities.  Symmetrical.  Speech is fluent. Motor exam as listed above. Psychiatric: Judgment intact, Mood & affect appropriate  for pt's clinical situation. Dermatologic: No rashes or ulcers noted.  No changes consistent with cellulitis.   CBC Lab Results  Component Value Date   WBC 10.7 12/18/2019   HGB 13.3 12/18/2019   HCT 41.4 12/18/2019   MCV 83 12/18/2019   PLT 177 12/18/2019    BMET    Component Value Date/Time   NA 140 12/18/2019 1504   NA 134 (L) 01/15/2014 1556   K 4.4 12/18/2019 1504   K 4.2 01/15/2014 1556   CL 102 12/18/2019 1504   CL 102 01/15/2014 1556   CO2 24 12/18/2019 1504   CO2 26 01/15/2014 1556   GLUCOSE 108 (H) 12/18/2019 1504   GLUCOSE 102 (H) 12/07/2019 0414   GLUCOSE 270 (H) 01/15/2014 1556   BUN 12 12/18/2019 1504   BUN 11 01/15/2014 1556   CREATININE 0.91 12/18/2019 1504   CREATININE 0.52 (L) 01/15/2014 1556   CALCIUM 8.7 12/18/2019 1504   CALCIUM 9.2 01/15/2014 1556   GFRNONAA 64 12/18/2019 1504   GFRNONAA >60 01/15/2014 1556   GFRAA 73 12/18/2019 1504   GFRAA >60 01/15/2014 1556   CrCl cannot be calculated (Patient's most recent lab result is older than the maximum 21 days allowed.).  COAG Lab Results  Component Value Date   INR 1.06 11/26/2016   INR 1.06 11/26/2016   INR 0.9 01/15/2014    Radiology CARDIAC CATHETERIZATION  Result Date: 01/23/2020  Prox LAD lesion is 40% stenosed.  Prox Cx lesion is 75% stenosed.  Prox Cx to Mid Cx lesion is 100% stenosed.  Lat 1st Mrg lesion is 100% stenosed.  Prox RCA lesion is 100% stenosed.  1st Diag lesion is 50% stenosed.  Mid LAD lesion is 50% stenosed.  Dist RCA lesion is 100% stenosed with 100% stenosed side branch in RPAV.  Conclusion Successful left heart catheter from right groin Severe multivessel coronary disease Normal left main Mild to moderate disease in the LAD 100% mid circumflex 100% percent proximal RCA Extensive collaterals left to distal right Moderate to severely depressed overall left ventricular function of around 35 to 40% Intervention was deferred Recommend aggressive medical therapy for  cardiomyopathy and heart failure     Assessment/Plan 1. Bilateral carotid artery stenosis The patient remains asymptomatic with respect to the carotid stenosis.  However, the patient has now progressed and has a lesion the is >70%.  Patient should undergo CT angiography of the carotid arteries to  define the degree of stenosis of the internal carotid arteries bilaterally and the anatomic suitability for surgery vs. intervention.  If the patient does indeed need surgery cardiac clearance will be required, once cleared the patient will be scheduled for surgery.  The risks, benefits and alternative therapies were reviewed in detail with the patient.  All questions were answered.  The patient agrees to proceed with imaging.  Continue antiplatelet therapy as prescribed. Continue management of CAD, HTN and Hyperlipidemia. Healthy heart diet, encouraged exercise at least 4 times per week.   - CT ANGIO NECK W OR WO CONTRAST; Future  2. Near syncope The patient remains asymptomatic with respect to the carotid stenosis.  However, the patient has now progressed and has a lesion the is >70%.  Patient should undergo CT angiography of the carotid arteries to define the degree of stenosis of the internal carotid arteries bilaterally and the anatomic suitability for surgery vs. intervention.  If the patient does indeed need surgery cardiac clearance will be required, once cleared the patient will be scheduled for surgery.  The risks, benefits and alternative therapies were reviewed in detail with the patient.  All questions were answered.  The patient agrees to proceed with imaging.  Continue antiplatelet therapy as prescribed. Continue management of CAD, HTN and Hyperlipidemia. Healthy heart diet, encouraged exercise at least 4 times per week.   - CT ANGIO NECK W OR WO CONTRAST; Future  3. Coronary artery disease involving native coronary artery of native heart without angina pectoris Continue  cardiac and antihypertensive medications as already ordered and reviewed, no changes at this time.  Continue statin as ordered and reviewed, no changes at this time  Nitrates PRN for chest pain   4. Chronic obstructive pulmonary disease, unspecified COPD type ([place]) Continue pulmonary medications and aerosols as already ordered, these medications have been reviewed and there are no changes at this time.    5. Mixed hyperlipidemia Continue statin as ordered and reviewed, no changes at this time    Hortencia Pilar, MD  02/04/2020 3:08 PM

## 2020-02-05 ENCOUNTER — Telehealth: Payer: Self-pay

## 2020-02-05 ENCOUNTER — Telehealth (INDEPENDENT_AMBULATORY_CARE_PROVIDER_SITE_OTHER): Payer: Self-pay

## 2020-02-05 NOTE — Telephone Encounter (Signed)
The following pt called to make Dr. Delana Meyer aware that she is scheduled to have her carotid CT at outpatient imaging on June 9th @ 3:00PM I have made Dr. Delana Meyer aware. Could you please at you convince schedule the pt a follow up appt  Per Dr. Delana Meyer .

## 2020-02-05 NOTE — Telephone Encounter (Signed)
That is fine she just needs to be placed on Dr. Nino Parsley schedule at her convenience any day after her CT Scan

## 2020-02-07 ENCOUNTER — Ambulatory Visit
Admission: RE | Admit: 2020-02-07 | Discharge: 2020-02-07 | Disposition: A | Discharge status: Home / Self Care | Payer: Medicare Other | Source: Ambulatory Visit | Attending: Vascular Surgery | Admitting: Vascular Surgery

## 2020-02-07 ENCOUNTER — Other Ambulatory Visit: Payer: Self-pay

## 2020-02-07 DIAGNOSIS — I6523 Occlusion and stenosis of bilateral carotid arteries: Secondary | ICD-10-CM | POA: Insufficient documentation

## 2020-02-07 DIAGNOSIS — R55 Syncope and collapse: Secondary | ICD-10-CM | POA: Insufficient documentation

## 2020-02-07 MED ORDER — IOHEXOL 350 MG/ML SOLN
75.0000 mL | Freq: Once | INTRAVENOUS | Status: AC | PRN
  Administered 2020-02-07: 75 mL via INTRAVENOUS

## 2020-02-09 ENCOUNTER — Encounter: Payer: Self-pay | Admitting: Family Medicine

## 2020-02-09 ENCOUNTER — Other Ambulatory Visit: Payer: Self-pay

## 2020-02-09 ENCOUNTER — Ambulatory Visit (INDEPENDENT_AMBULATORY_CARE_PROVIDER_SITE_OTHER): Payer: Medicare Other | Admitting: Family Medicine

## 2020-02-09 VITALS — BP 104/68 | HR 74 | Temp 98.7°F | Wt 174.0 lb

## 2020-02-09 DIAGNOSIS — I208 Other forms of angina pectoris: Secondary | ICD-10-CM

## 2020-02-09 DIAGNOSIS — L989 Disorder of the skin and subcutaneous tissue, unspecified: Secondary | ICD-10-CM

## 2020-02-09 MED ORDER — SILVER SULFADIAZINE 1 % EX CREA
1.0000 | TOPICAL_CREAM | Freq: Every day | CUTANEOUS | 0 refills | Status: DC
Start: 2020-02-09 — End: 2022-05-28

## 2020-02-09 NOTE — Progress Notes (Signed)
BP 104/68   Pulse 74   Temp 98.7 F (37.1 C)   Wt 174 lb (78.9 kg) Comment: patient reported  SpO2 94%   BMI 29.87 kg/m    Subjective:    [name], female    DOB: 04/02/1946, 74 y.o.   MRN: [id number]  HPI: Sarah White is a 74 y.o. female  Chief Complaint  Patient presents with  . skin lesion    near rectum   SKIN LESION Duration: has had it for years, but after her last cath it has been swelling and hurting.  Location: near rectum Painful: yes Itching: no Onset: gradual Context: bigger Associated signs and symptoms: bleeding and swelling History of skin cancer: no  Relevant past medical, surgical, family and social history reviewed and updated as indicated. Interim medical history since our last visit reviewed. Allergies and medications reviewed and updated.  Review of Systems  Constitutional: Negative.   Respiratory: Negative.   Cardiovascular: Negative.   Gastrointestinal: Negative.   Skin: Negative.   Neurological: Negative.   Psychiatric/Behavioral: Negative.     Per HPI unless specifically indicated above     Objective:    BP 104/68   Pulse 74   Temp 98.7 F (37.1 C)   Wt 174 lb (78.9 kg) Comment: patient reported  SpO2 94%   BMI 29.87 kg/m   Wt Readings from Last 3 Encounters:  02/09/20 174 lb (78.9 kg)  02/01/20 174 lb 6.4 oz (79.1 kg)  01/23/20 176 lb (79.8 kg)    Physical Exam Vitals and nursing note reviewed.  Constitutional:      General: She is not in acute distress.    Appearance: Normal appearance. She is not ill-appearing, toxic-appearing or diaphoretic.  HENT:     Head: Normocephalic and atraumatic.     Right Ear: External ear normal.     Left Ear: External ear normal.     Nose: Nose normal.     Mouth/Throat:     Mouth: Mucous membranes are moist.     Pharynx: Oropharynx is clear.  Eyes:     General: No scleral icterus.       Right eye: No discharge.        Left eye: No discharge.     Extraocular  Movements: Extraocular movements intact.     Conjunctiva/sclera: Conjunctivae normal.     Pupils: Pupils are equal, round, and reactive to light.  Cardiovascular:     Rate and Rhythm: Normal rate and regular rhythm.     Pulses: Normal pulses.     Heart sounds: Normal heart sounds. No murmur heard.  No friction rub. No gallop.   Pulmonary:     Effort: Pulmonary effort is normal. No respiratory distress.     Breath sounds: Normal breath sounds. No stridor. No wheezing, rhonchi or rales.  Chest:     Chest wall: No tenderness.  Musculoskeletal:        General: Normal range of motion.     Cervical back: Normal range of motion and neck supple.  Skin:    General: Skin is warm and dry.     Capillary Refill: Capillary refill takes less than 2 seconds.     Coloration: Skin is not jaundiced or pale.     Findings: No bruising, erythema, lesion or rash.     Comments: Large skin tag on R side of buttock about 2 inches from anus  Neurological:     General: No focal deficit present.  Mental Status: She is alert and oriented to person, place, and time. Mental status is at baseline.  Psychiatric:        Mood and Affect: Mood normal.        Behavior: Behavior normal.        Thought Content: Thought content normal.        Judgment: Judgment normal.     Results for orders placed or performed during the hospital encounter of 01/23/20  Glucose, capillary  Result Value Ref Range   Glucose-Capillary 86 70 - 99 mg/dL      Assessment & Plan:   Problem List Items Addressed This Visit    None    Visit Diagnoses    Skin lesion    -  Primary   Removed today as below. Silvadene on cautery, remain covered due to location. Dressing change daily. Recheck 4 days.       Skin Procedure  Procedure:    Informed consent given.  Area infiltrated with lidocaine with epinephrine.  Lesion completely excised.  Diagnosis:   ICD-10-CM   1. Skin lesion  L98.9    Removed today as below. Silvadene on  cautery, remain covered due to location. Dressing change daily. Recheck 4 days.     Lesion Location/Size: 1.5 inch skin tag 2 inches from anus on R butock Physician: MJ Consent:  Risks, benefits, and alternative treatments discussed and all questions were answered.  Patient elected to proceed and verbal consent obtained.  Description: Area prepped and draped using semi-sterile technique. Area locally anesthetized using 12 cc's of lidocaine 1% with epi. Lesion removed and cauterized using electrocautery.  Adequate hemostastis achieved using electrocautery.Wound dressed after application of bacitracin ointment Post Procedure Instructions: Wound care instructions discussed and patient was instructed to keep area clean and dry.  Signs and symptoms of infection discussed, patient agrees to contact the office ASAP should they occur.  Dressing change recommended daily.   Follow up plan: Return Tuesday wound check.

## 2020-02-13 ENCOUNTER — Ambulatory Visit (INDEPENDENT_AMBULATORY_CARE_PROVIDER_SITE_OTHER): Payer: Medicare Other | Admitting: Family Medicine

## 2020-02-13 ENCOUNTER — Other Ambulatory Visit: Payer: Self-pay

## 2020-02-13 ENCOUNTER — Encounter: Payer: Self-pay | Admitting: Family Medicine

## 2020-02-13 VITALS — BP 92/53 | HR 77 | Temp 98.5°F | Ht 62.0 in | Wt 177.0 lb

## 2020-02-13 DIAGNOSIS — L989 Disorder of the skin and subcutaneous tissue, unspecified: Secondary | ICD-10-CM

## 2020-02-13 DIAGNOSIS — I208 Other forms of angina pectoris: Secondary | ICD-10-CM

## 2020-02-13 DIAGNOSIS — S41112A Laceration without foreign body of left upper arm, initial encounter: Secondary | ICD-10-CM

## 2020-02-13 NOTE — Progress Notes (Signed)
BP (!) 92/53 (BP Location: Right Arm, Patient Position: Sitting, Cuff Size: Normal)   Pulse 77   Temp 98.5 F (36.9 C) (Oral)   Ht 5\' 2"  (1.575 m)   Wt 177 lb (80.3 kg)   SpO2 94%   BMI 32.37 kg/m    Subjective:    [name], female    DOB: 05-Aug-1946, 74 y.o.   MRN: [id number]  HPI: Sarah White is a 74 y.o. female  Chief Complaint  Patient presents with  . Wound Check    Follow-up on wound from 02/09/2020. Also patient stumbled 2 days ago (did not fall) and hit a part of the door with left arm.    Has been healing well. No pain from her skin tag removal. Feeling well. Has been keeping it covered.   Stumbled on Sunday and tore the skin on her L arm near her elbow. No swelling, no numbness no tingling. She is not bleeding any more she's keeping it covered. She is otherwise feeling well with no other concerns or complaints at this time.   Relevant past medical, surgical, family and social history reviewed and updated as indicated. Interim medical history since our last visit reviewed. Allergies and medications reviewed and updated.  Review of Systems  Constitutional: Negative.   Respiratory: Negative.   Cardiovascular: Negative.   Skin: Positive for wound. Negative for color change, pallor and rash.  Psychiatric/Behavioral: Negative.     Per HPI unless specifically indicated above     Objective:    BP (!) 92/53 (BP Location: Right Arm, Patient Position: Sitting, Cuff Size: Normal)   Pulse 77   Temp 98.5 F (36.9 C) (Oral)   Ht 5\' 2"  (1.575 m)   Wt 177 lb (80.3 kg)   SpO2 94%   BMI 32.37 kg/m   Wt Readings from Last 3 Encounters:  02/13/20 177 lb (80.3 kg)  02/09/20 174 lb (78.9 kg)  02/01/20 174 lb 6.4 oz (79.1 kg)    Physical Exam Vitals and nursing note reviewed.  Constitutional:      General: She is not in acute distress.    Appearance: Normal appearance. She is not ill-appearing, toxic-appearing or diaphoretic.  HENT:     Head:  Normocephalic and atraumatic.     Right Ear: External ear normal.     Left Ear: External ear normal.     Nose: Nose normal.     Mouth/Throat:     Mouth: Mucous membranes are moist.     Pharynx: Oropharynx is clear.  Eyes:     General: No scleral icterus.       Right eye: No discharge.        Left eye: No discharge.     Extraocular Movements: Extraocular movements intact.     Conjunctiva/sclera: Conjunctivae normal.     Pupils: Pupils are equal, round, and reactive to light.  Cardiovascular:     Rate and Rhythm: Normal rate and regular rhythm.     Pulses: Normal pulses.     Heart sounds: Normal heart sounds. No murmur heard.  No friction rub. No gallop.   Pulmonary:     Effort: Pulmonary effort is normal. No respiratory distress.     Breath sounds: Normal breath sounds. No stridor. No wheezing, rhonchi or rales.  Chest:     Chest wall: No tenderness.  Musculoskeletal:        General: Normal range of motion.     Cervical back: Normal range of  motion and neck supple.  Skin:    General: Skin is warm and dry.     Capillary Refill: Capillary refill takes less than 2 seconds.     Coloration: Skin is not jaundiced or pale.     Findings: No bruising, erythema, lesion or rash.     Comments: Wound on buttock healing well. No redness, no heat.  Skin tear on L elbow about 3inches x 1.5 inches with extensive bruising.  Neurological:     General: No focal deficit present.     Mental Status: She is alert and oriented to person, place, and time. Mental status is at baseline.  Psychiatric:        Mood and Affect: Mood normal.        Behavior: Behavior normal.        Thought Content: Thought content normal.        Judgment: Judgment normal.     Results for orders placed or performed during the hospital encounter of 01/23/20  Glucose, capillary  Result Value Ref Range   Glucose-Capillary 86 70 - 99 mg/dL      Assessment & Plan:   Problem List Items Addressed This Visit    None      Visit Diagnoses    Skin lesion    -  Primary   Wound healing well. Continue to monitor. Continue silvadine. Call with any concerns.    Skin tear of left upper arm without complication, initial encounter       Healing well. Wound wrapped today. Continue to monitor.        Follow up plan: Return if symptoms worsen or fail to improve.

## 2020-02-18 ENCOUNTER — Encounter (INDEPENDENT_AMBULATORY_CARE_PROVIDER_SITE_OTHER): Payer: Self-pay

## 2020-02-22 ENCOUNTER — Other Ambulatory Visit: Payer: Self-pay

## 2020-02-22 ENCOUNTER — Encounter (INDEPENDENT_AMBULATORY_CARE_PROVIDER_SITE_OTHER): Payer: Self-pay | Admitting: Vascular Surgery

## 2020-02-22 ENCOUNTER — Ambulatory Visit (INDEPENDENT_AMBULATORY_CARE_PROVIDER_SITE_OTHER): Payer: Medicare Other | Admitting: Vascular Surgery

## 2020-02-22 VITALS — BP 113/61 | HR 73 | Resp 16 | Wt 176.0 lb

## 2020-02-22 DIAGNOSIS — I2510 Atherosclerotic heart disease of native coronary artery without angina pectoris: Secondary | ICD-10-CM

## 2020-02-22 DIAGNOSIS — I6523 Occlusion and stenosis of bilateral carotid arteries: Secondary | ICD-10-CM

## 2020-02-22 DIAGNOSIS — J449 Chronic obstructive pulmonary disease, unspecified: Secondary | ICD-10-CM | POA: Diagnosis not present

## 2020-02-22 DIAGNOSIS — I208 Other forms of angina pectoris: Secondary | ICD-10-CM

## 2020-02-22 DIAGNOSIS — I5021 Acute systolic (congestive) heart failure: Secondary | ICD-10-CM

## 2020-02-22 DIAGNOSIS — E782 Mixed hyperlipidemia: Secondary | ICD-10-CM

## 2020-02-22 NOTE — Progress Notes (Signed)
MRN : [id number]  Sarah White is a 74 y.o. (09/22/1945) female who presents with chief complaint of  Chief Complaint  Patient presents with  . Follow-up    ct results  .  History of Present Illness:   The patient is seen for follow up evaluation of carotid stenosis status post CT angiogram. Patient reports that the test went well with no problems or complications.   The patient denies interval amaurosis fugax. There is no recent or interval TIA symptoms or focal motor deficits. There is no prior documented CVA.  The patient is taking enteric-coated aspirin 81 mg daily.  There is no history of migraine headaches. There is no history of seizures.  The patient has a history of coronary artery disease, no recent episodes of angina or shortness of breath. The patient denies PAD or claudication symptoms. There is a history of hyperlipidemia which is being treated with a statin.    CT angiogram is reviewed by me personally and shows 90% right ICA stenosis consistent with calcified plaque at the origin of the right internal carotid artery.   Current Meds  Medication Sig  . albuterol (VENTOLIN HFA) 108 (90 Base) MCG/ACT inhaler Inhale 2 puffs into the lungs every 6 (six) hours as needed for wheezing or shortness of breath.  Marland Kitchen amiodarone (PACERONE) 200 MG tablet Take 100 mg by mouth daily.   Marland Kitchen aspirin EC 81 MG tablet Take 81 mg by mouth at bedtime.   . budesonide-formoterol (SYMBICORT) 160-4.5 MCG/ACT inhaler Inhale 2 puffs into the lungs 2 (two) times daily.  . carvedilol (COREG) 12.5 MG tablet TAKE 2 TABLETS BY MOUTH TWICE DAILY WITH A MEAL (Patient taking differently: Take 25 mg by mouth 2 (two) times daily with a meal. )  . cyclobenzaprine (FLEXERIL) 10 MG tablet Take 1 tablet (10 mg total) by mouth 3 (three) times daily as needed for muscle spasms. DO NOT DRIVE ON THIS MEDICINE- WILL MAKE YOU SLEEPY  . diphenhydrAMINE (BENADRYL) 25 MG tablet Take 25 mg by mouth every 6 (six) hours  as needed for allergies.   . Dulaglutide (TRULICITY) 1.5 ZH/2.9JM SOPN Inject 1.5 mg into the skin once a week.  . furosemide (LASIX) 40 MG tablet Take 0.5 tablets by mouth daily.  . isosorbide mononitrate (IMDUR) 60 MG 24 hr tablet Take 1 tablet (60 mg total) by mouth daily.  . Lidocaine 4 % PTCH Apply 1 patch topically every 12 (twelve) hours. (Patient taking differently: Apply 1 patch topically daily as needed (pain). )  . meclizine (ANTIVERT) 12.5 MG tablet Take 1 tablet (12.5 mg total) by mouth 3 (three) times daily as needed for dizziness.  . Multiple Vitamin (MULTIVITAMIN WITH MINERALS) TABS tablet Take 1 tablet by mouth daily.  . nitroGLYCERIN (NITROSTAT) 0.4 MG SL tablet Place 1 tablet (0.4 mg total) under the tongue every 5 (five) minutes as needed for chest pain.  Marland Kitchen ondansetron (ZOFRAN-ODT) 4 MG disintegrating tablet Take 4 mg by mouth every 8 (eight) hours as needed for nausea or vomiting.  . rosuvastatin (CRESTOR) 20 MG tablet Take 1 tablet (20 mg total) by mouth daily.  . sacubitril-valsartan (ENTRESTO) 49-51 MG Take 1 tablet by mouth 2 (two) times daily.   . sertraline (ZOLOFT) 100 MG tablet Take 2 tablets (200 mg total) by mouth daily.  . silver sulfADIAZINE (SILVADENE) 1 % cream Apply 1 application topically daily.  Marland Kitchen tiotropium (SPIRIVA HANDIHALER) 18 MCG inhalation capsule Place 1 capsule (18 mcg total) into inhaler and  inhale daily.  . traZODone (DESYREL) 50 MG tablet TAKE 1/2 TO 1 (ONE-HALF TO ONE) TABLET BY MOUTH AT BEDTIME AS NEEDED FOR SLEEP (Patient taking differently: Take 50 mg by mouth at bedtime. )  . triamcinolone ointment (KENALOG) 0.5 % Apply 1 application topically 2 (two) times daily.    Past Medical History:  Diagnosis Date  . Acute pancreatitis 09/09/2018  . Acute respiratory failure with hypoxia and hypercarbia ([place]) 07/22/2015  . Cancer ([place])    uterine  . CHF (congestive heart failure) ([place])   . COPD (chronic obstructive pulmonary disease) ([place])   .  Diabetes mellitus without complication ([place])   . Encephalopathy acute 07/22/2015  . Hypertension   . Pneumonia    November 2016  . Pulmonary edema 07/22/2015  . 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 ABDOMINAL HYSTERECTOMY    . CARPAL TUNNEL RELEASE Bilateral   . CESAREAN SECTION    . CHOLECYSTECTOMY N/A 09/12/2018   Procedure: LAPAROSCOPIC CHOLECYSTECTOMY WITH INTRAOPERATIVE CHOLANGIOGRAM;  Surgeon: Herbert Pun, MD;  Location: [facility];  Service: General;  Laterality: N/A;  . CORONARY ANGIOPLASTY WITH STENT PLACEMENT    . KNEE SURGERY Left   . LEFT HEART CATH AND CORONARY ANGIOGRAPHY Right 11/26/2016   Procedure: Left Heart Cath and Coronary Angiography;  Surgeon: Isaias Cowman, MD;  Location: [place] [facility];  Service: Cardiovascular;  Laterality: Right;  . LEFT HEART CATH AND CORONARY ANGIOGRAPHY N/A 01/23/2020   Procedure: LEFT HEART CATH AND CORONARY ANGIOGRAPHY;  Surgeon: Yolonda Kida, MD;  Location: [place] [facility];  Service: Cardiovascular;  Laterality: N/A;    Social History Social History   Tobacco Use  . Smoking status: Current Every Day Smoker    Packs/day: 0.50    Types: Cigarettes  . Smokeless tobacco: Never Used  Vaping Use  . Vaping Use: Never used  Substance Use Topics  . Alcohol use: No  . Drug use: No    Family History Family History  Problem Relation Age of Onset  . Heart disease Mother     Allergies  Allergen Reactions  . Amlodipine Swelling  . Atorvastatin Other (See Comments)    myalgia  . Codeine Nausea And Vomiting  . Hctz [Hydrochlorothiazide] Other (See Comments)    Hypokalemia  . Lisinopril Other (See Comments)    Dizziness   . Metformin And Related Nausea Only    Nausea, dizziness     REVIEW OF SYSTEMS (Negative unless checked)  Constitutional: [] Weight loss  [] Fever  [] Chills Cardiac: [] Chest pain   [] Chest pressure    [] Palpitations   [] Shortness of breath when laying flat   [] Shortness of breath with exertion. Vascular:  [] Pain in legs with walking   [] Pain in legs at rest  [] History of DVT   [] Phlebitis   [] Swelling in legs   [] Varicose veins   [] Non-healing ulcers Pulmonary:   [] Uses home oxygen   [] Productive cough   [] Hemoptysis   [] Wheeze  [] COPD   [] Asthma Neurologic:  [x] Dizziness   [] Seizures   [] History of stroke   [x] History of TIA  [] Aphasia   [] Vissual changes   [] Weakness or numbness in arm   [] Weakness or numbness in leg Musculoskeletal:   [] Joint swelling   [] Joint pain   [] Low back pain Hematologic:  [] Easy bruising  [] Easy bleeding   [] Hypercoagulable state   [] Anemic Gastrointestinal:  [] Diarrhea   [] Vomiting  [] Gastroesophageal reflux/heartburn   []   Difficulty swallowing. Genitourinary:  [] Chronic kidney disease   [] Difficult urination  [] Frequent urination   [] Blood in urine Skin:  [] Rashes   [] Ulcers  Psychological:  [] History of anxiety   []  History of major depression.  Physical Examination  Vitals:   02/22/20 1429  BP: 113/61  Pulse: 73  Resp: 16  Weight: 176 lb (79.8 kg)   Body mass index is 32.19 kg/m. Gen: WD/WN, NAD Head: [employer]/AT, No temporalis wasting.  Ear/Nose/Throat: Hearing grossly intact, nares w/o erythema or drainage Eyes: PER, EOMI, sclera nonicteric.  Neck: Supple, no large masses.   Pulmonary:  Good air movement, no audible wheezing bilaterally, no use of accessory muscles.  Cardiac: RRR, no JVD Vascular: right carotid bruit Vessel Right Left  Radial Palpable Palpable  Brachial Palpable Palpable  Carotid Palpable Palpable  Gastrointestinal: Non-distended. No guarding/no peritoneal signs.  Musculoskeletal: M/S 5/5 throughout.  No deformity or atrophy.  Neurologic: CN 2-12 intact. Symmetrical.  Speech is fluent. Motor exam as listed above. Psychiatric: Judgment intact, Mood & affect appropriate for pt's clinical situation. Dermatologic: No rashes or ulcers  noted.  No changes consistent with cellulitis. Lymph : No lichenification or skin changes of chronic lymphedema.  CBC Lab Results  Component Value Date   WBC 10.7 12/18/2019   HGB 13.3 12/18/2019   HCT 41.4 12/18/2019   MCV 83 12/18/2019   PLT 177 12/18/2019    BMET    Component Value Date/Time   NA 140 12/18/2019 1504   NA 134 (L) 01/15/2014 1556   K 4.4 12/18/2019 1504   K 4.2 01/15/2014 1556   CL 102 12/18/2019 1504   CL 102 01/15/2014 1556   CO2 24 12/18/2019 1504   CO2 26 01/15/2014 1556   GLUCOSE 108 (H) 12/18/2019 1504   GLUCOSE 102 (H) 12/07/2019 0414   GLUCOSE 270 (H) 01/15/2014 1556   BUN 12 12/18/2019 1504   BUN 11 01/15/2014 1556   CREATININE 0.91 12/18/2019 1504   CREATININE 0.52 (L) 01/15/2014 1556   CALCIUM 8.7 12/18/2019 1504   CALCIUM 9.2 01/15/2014 1556   GFRNONAA 64 12/18/2019 1504   GFRNONAA >60 01/15/2014 1556   GFRAA 73 12/18/2019 1504   GFRAA >60 01/15/2014 1556   CrCl cannot be calculated (Patient's most recent lab result is older than the maximum 21 days allowed.).  COAG Lab Results  Component Value Date   INR 1.06 11/26/2016   INR 1.06 11/26/2016   INR 0.9 01/15/2014    Radiology CT ANGIO NECK W OR WO CONTRAST  Result Date: 02/08/2020 CLINICAL DATA:  Carotid stenosis. EXAM: CT ANGIOGRAPHY NECK TECHNIQUE: Multidetector CT imaging of the neck was performed using the standard protocol during bolus administration of intravenous contrast. Multiplanar CT image reconstructions and MIPs were obtained to evaluate the vascular anatomy. Carotid stenosis measurements (when applicable) are obtained utilizing NASCET criteria, using the distal internal carotid diameter as the denominator. CONTRAST:  41mL OMNIPAQUE IOHEXOL 350 MG/ML SOLN COMPARISON:  None. FINDINGS: Aortic arch: Standard branching. Imaged portion shows no evidence of aneurysm or dissection. Atherosclerotic changes noted in the aortic arch with mixed density plaques. No significant stenosis  of the major arch vessel origins. Right carotid system: Atherosclerotic changes of the right carotid bifurcation with mixed density plaques, predominantly soft resulting in approximately 80% stenosis at the origin of the right ICA Left carotid system: Mild atherosclerotic changes at the origin of the left common carotid artery without hemodynamically significant stenosis. Mixed density plaques are noted in the left carotid bifurcation resulting  in mild stenosis of the distal left common carotid artery. No hemodynamically significant stenosis of the cervical left ICA. Intracranially, mild calcified plaques are seen in the bilateral carotid siphons. The proximal ACA and MCA vascular trees appear normal, noting that the bilateral A2 segments are supplied by the right A1/ACA given hypoplastic left A1. Vertebral arteries: Diffuse atherosclerotic disease of the left subclavian artery with irregular predominantly soft density plaques. No hemodynamically significant stenosis. Mild diffuse atherosclerotic disease of the right subclavian artery without hemodynamically significant stenosis. The bilateral vertebral arteries have normal course and caliber and are codominant. The basilar artery and visualized portions of the posterior cerebral artery are unremarkable. Skeleton: No focal bone lesion identified. Other neck: Mild mucosal thickening of the left maxillary sinus Upper chest: Bilateral centrilobular emphysema. IMPRESSION: 1. Atherosclerotic changes at the right carotid bifurcation resulting in approximately 80% stenosis at the origin of the right ICA. 2. Mixed density plaques at the left carotid bifurcation resulting in mild stenosis of the distal left common carotid artery. No hemodynamically significant stenosis of the cervical left ICA. 3. Diffuse atherosclerotic disease of the left subclavian artery with irregular predominantly soft density plaques. No hemodynamically significant stenosis. 4. Emphysema and aortic  atherosclerosis. Aortic Atherosclerosis (ICD10-I70.0) and Emphysema (ICD10-J43.9). Electronically Signed   By: Pedro Earls M.D.   On: 02/08/2020 15:28     Assessment/Plan 1. Symptomatic stenosis of both carotid arteries without infarction Recommend:  The patient is symptomatic with respect to the carotid stenosis.  The patient now has progressed and has a lesion the is >90%.  Patient's CT angiography of the carotid arteries confirms >90% right ICA stenosis.  The anatomical considerations as well as multiple comorbidities support stenting over surgery.  This was discussed in detail with the patient.  The risks, benefits and alternative therapies were reviewed in detail with the patient.  All questions were answered.  The patient agrees to proceed with stenting of the right carotid artery.  Continue antiplatelet therapy as prescribed. Continue management of CAD, HTN and Hyperlipidemia. Healthy heart diet, encouraged exercise at least 4 times per week.   2. Coronary artery disease involving native coronary artery of native heart without angina pectoris Continue cardiac and antihypertensive medications as already ordered and reviewed, no changes at this time.  Continue statin as ordered and reviewed, no changes at this time  Nitrates PRN for chest pain   3. Acute systolic CHF (congestive heart failure) (HCC) Continue cardiac and antihypertensive medications as already ordered and reviewed, no changes at this time.  Continue statin as ordered and reviewed, no changes at this time  Nitrates PRN for chest pain   4. Chronic obstructive pulmonary disease, unspecified COPD type ([place]) Continue pulmonary medications and aerosols as already ordered, these medications have been reviewed and there are no changes at this time.    5. Mixed hyperlipidemia Continue statin as ordered and reviewed, no changes at this time     Hortencia Pilar, MD  02/22/2020 2:32 PM

## 2020-02-23 ENCOUNTER — Telehealth (INDEPENDENT_AMBULATORY_CARE_PROVIDER_SITE_OTHER): Payer: Self-pay

## 2020-02-23 NOTE — Telephone Encounter (Signed)
Spoke with the patient and she is scheduled with Dr. Delana Meyer for a right carotid stent placement on 03/06/20 with a 8:15 am arrival time to the [facility]. Patient will do covid testing on 03/04/20 between 8-1 pm at the [facility]. Pre-procedure instructions were discussed and will be mailed.

## 2020-03-04 ENCOUNTER — Encounter (INDEPENDENT_AMBULATORY_CARE_PROVIDER_SITE_OTHER): Payer: Self-pay | Admitting: Vascular Surgery

## 2020-03-04 ENCOUNTER — Other Ambulatory Visit: Payer: Self-pay | Admitting: Family Medicine

## 2020-03-05 ENCOUNTER — Other Ambulatory Visit
Admission: RE | Admit: 2020-03-05 | Discharge: 2020-03-05 | Disposition: A | Discharge status: Home / Self Care | Payer: Medicare Other | Source: Ambulatory Visit | Attending: Vascular Surgery | Admitting: Vascular Surgery

## 2020-03-05 ENCOUNTER — Other Ambulatory Visit (INDEPENDENT_AMBULATORY_CARE_PROVIDER_SITE_OTHER): Payer: Self-pay | Admitting: Nurse Practitioner

## 2020-03-05 ENCOUNTER — Other Ambulatory Visit: Payer: Self-pay

## 2020-03-05 LAB — SARS CORONAVIRUS 2 (TAT 6-24 HRS): SARS Coronavirus 2: NEGATIVE

## 2020-03-06 ENCOUNTER — Encounter
Admission: RE | Disposition: A | Discharge status: Home / Self Care | Payer: Self-pay | Source: Home / Self Care | Attending: Vascular Surgery

## 2020-03-06 ENCOUNTER — Encounter: Payer: Self-pay | Admitting: Vascular Surgery

## 2020-03-06 ENCOUNTER — Inpatient Hospital Stay
Admission: RE | Admit: 2020-03-06 | Discharge: 2020-03-08 | DRG: 035 | Disposition: A | Discharge status: Home / Self Care | Payer: Medicare Other | Attending: Vascular Surgery | Admitting: Vascular Surgery

## 2020-03-06 DIAGNOSIS — R110 Nausea: Secondary | ICD-10-CM | POA: Diagnosis not present

## 2020-03-06 DIAGNOSIS — I429 Cardiomyopathy, unspecified: Secondary | ICD-10-CM | POA: Diagnosis present

## 2020-03-06 DIAGNOSIS — I6521 Occlusion and stenosis of right carotid artery: Secondary | ICD-10-CM | POA: Diagnosis present

## 2020-03-06 DIAGNOSIS — Z8542 Personal history of malignant neoplasm of other parts of uterus: Secondary | ICD-10-CM | POA: Diagnosis not present

## 2020-03-06 DIAGNOSIS — Z885 Allergy status to narcotic agent status: Secondary | ICD-10-CM

## 2020-03-06 DIAGNOSIS — Z888 Allergy status to other drugs, medicaments and biological substances status: Secondary | ICD-10-CM

## 2020-03-06 DIAGNOSIS — E119 Type 2 diabetes mellitus without complications: Secondary | ICD-10-CM | POA: Diagnosis present

## 2020-03-06 DIAGNOSIS — H5461 Unqualified visual loss, right eye, normal vision left eye: Secondary | ICD-10-CM | POA: Diagnosis present

## 2020-03-06 DIAGNOSIS — Z20822 Contact with and (suspected) exposure to covid-19: Secondary | ICD-10-CM | POA: Diagnosis present

## 2020-03-06 DIAGNOSIS — I509 Heart failure, unspecified: Secondary | ICD-10-CM | POA: Diagnosis present

## 2020-03-06 DIAGNOSIS — Z79899 Other long term (current) drug therapy: Secondary | ICD-10-CM | POA: Diagnosis not present

## 2020-03-06 DIAGNOSIS — Z7984 Long term (current) use of oral hypoglycemic drugs: Secondary | ICD-10-CM | POA: Diagnosis not present

## 2020-03-06 DIAGNOSIS — I110 Hypertensive heart disease with heart failure: Secondary | ICD-10-CM | POA: Diagnosis present

## 2020-03-06 DIAGNOSIS — I2510 Atherosclerotic heart disease of native coronary artery without angina pectoris: Secondary | ICD-10-CM | POA: Diagnosis present

## 2020-03-06 DIAGNOSIS — Z85828 Personal history of other malignant neoplasm of skin: Secondary | ICD-10-CM

## 2020-03-06 DIAGNOSIS — J449 Chronic obstructive pulmonary disease, unspecified: Secondary | ICD-10-CM | POA: Diagnosis present

## 2020-03-06 DIAGNOSIS — I6529 Occlusion and stenosis of unspecified carotid artery: Secondary | ICD-10-CM | POA: Diagnosis present

## 2020-03-06 HISTORY — PX: CAROTID PTA/STENT INTERVENTION: CATH118231

## 2020-03-06 LAB — MRSA PCR SCREENING: MRSA by PCR: NEGATIVE

## 2020-03-06 LAB — GLUCOSE, CAPILLARY
Glucose-Capillary: 87 mg/dL (ref 70–99)
Glucose-Capillary: 94 mg/dL (ref 70–99)
Glucose-Capillary: 172 mg/dL — ABNORMAL HIGH (ref 70–99)

## 2020-03-06 LAB — BUN: BUN: 18 mg/dL (ref 8–23)

## 2020-03-06 LAB — CREATININE, SERUM
Creatinine, Ser: 0.92 mg/dL (ref 0.44–1.00)
GFR calc Af Amer: >60 mL/min (ref >60)
GFR calc non Af Amer: >60 mL/min (ref >60)

## 2020-03-06 LAB — POCT ACTIVATED CLOTTING TIME: Activated Clotting Time: 285 seconds

## 2020-03-06 SURGERY — CAROTID PTA/STENT INTERVENTION
Anesthesia: Moderate Sedation | Laterality: Right

## 2020-03-06 MED ORDER — DOPAMINE-DEXTROSE 3.2-5 MG/ML-% IV SOLN
0.0000 ug/kg/min | INTRAVENOUS | Status: DC
  Administered 2020-03-06: 2 ug/kg/min via INTRAVENOUS

## 2020-03-06 MED ORDER — MIDAZOLAM HCL 2 MG/2ML IJ SOLN
INTRAMUSCULAR | Status: DC | PRN
  Administered 2020-03-06: 2 mg via INTRAVENOUS

## 2020-03-06 MED ORDER — OXYCODONE HCL 5 MG PO TABS
ORAL_TABLET | ORAL | Status: AC
  Filled 2020-03-06: qty 1

## 2020-03-06 MED ORDER — FUROSEMIDE 20 MG PO TABS
20.0000 mg | ORAL_TABLET | Freq: Every day | ORAL | Status: DC
  Administered 2020-03-06 – 2020-03-08 (×3): 20 mg via ORAL
  Filled 2020-03-06 (×3): qty 1

## 2020-03-06 MED ORDER — ASPIRIN EC 81 MG PO TBEC
81.0000 mg | DELAYED_RELEASE_TABLET | Freq: Every day | ORAL | Status: DC
  Administered 2020-03-06 – 2020-03-07 (×2): 81 mg via ORAL
  Filled 2020-03-06 (×2): qty 1

## 2020-03-06 MED ORDER — MIDAZOLAM HCL 5 MG/5ML IJ SOLN
INTRAMUSCULAR | Status: AC
  Filled 2020-03-06: qty 5

## 2020-03-06 MED ORDER — ROSUVASTATIN CALCIUM 20 MG PO TABS
20.0000 mg | ORAL_TABLET | Freq: Every day | ORAL | Status: DC
  Administered 2020-03-06 – 2020-03-08 (×3): 20 mg via ORAL
  Filled 2020-03-06: qty 2
  Filled 2020-03-06 (×2): qty 1
  Filled 2020-03-06: qty 2
  Filled 2020-03-06: qty 1
  Filled 2020-03-06: qty 2

## 2020-03-06 MED ORDER — ALUM & MAG HYDROXIDE-SIMETH 200-200-20 MG/5ML PO SUSP: 15.0000 mL | ORAL | Status: DC | PRN

## 2020-03-06 MED ORDER — AMIODARONE HCL 100 MG PO TABS
100.0000 mg | ORAL_TABLET | Freq: Every day | ORAL | Status: DC
  Administered 2020-03-06 – 2020-03-08 (×3): 100 mg via ORAL
  Filled 2020-03-06 (×3): qty 1

## 2020-03-06 MED ORDER — GUAIFENESIN-DM 100-10 MG/5ML PO SYRP: 15.0000 mL | ORAL_SOLUTION | ORAL | Status: DC | PRN

## 2020-03-06 MED ORDER — FAMOTIDINE 20 MG PO TABS: 40.0000 mg | ORAL_TABLET | Freq: Once | ORAL | Status: DC | PRN

## 2020-03-06 MED ORDER — HYDROMORPHONE HCL 1 MG/ML IJ SOLN: 1.0000 mg | Freq: Once | INTRAMUSCULAR | Status: DC | PRN

## 2020-03-06 MED ORDER — CYCLOBENZAPRINE HCL 10 MG PO TABS
10.0000 mg | ORAL_TABLET | Freq: Three times a day (TID) | ORAL | Status: DC | PRN
  Filled 2020-03-06: qty 1

## 2020-03-06 MED ORDER — SODIUM CHLORIDE 0.9 % IV SOLN: 500.0000 mL | Freq: Once | INTRAVENOUS | Status: DC | PRN

## 2020-03-06 MED ORDER — HEPARIN SODIUM (PORCINE) 1000 UNIT/ML IJ SOLN
INTRAMUSCULAR | Status: AC
  Filled 2020-03-06: qty 1

## 2020-03-06 MED ORDER — NITROGLYCERIN 0.4 MG SL SUBL: 0.4000 mg | SUBLINGUAL_TABLET | SUBLINGUAL | Status: DC | PRN

## 2020-03-06 MED ORDER — ONDANSETRON HCL 4 MG/2ML IJ SOLN: 4.0000 mg | Freq: Four times a day (QID) | INTRAMUSCULAR | Status: DC | PRN

## 2020-03-06 MED ORDER — ATROPINE SULFATE 1 MG/10ML IJ SOSY
PREFILLED_SYRINGE | INTRAMUSCULAR | Status: DC | PRN
  Administered 2020-03-06: 1 mg via INTRAVENOUS

## 2020-03-06 MED ORDER — FENTANYL CITRATE (PF) 100 MCG/2ML IJ SOLN
INTRAMUSCULAR | Status: DC | PRN
  Administered 2020-03-06: 50 ug via INTRAVENOUS

## 2020-03-06 MED ORDER — CLOPIDOGREL BISULFATE 75 MG PO TABS
450.0000 mg | ORAL_TABLET | ORAL | Status: AC
  Administered 2020-03-06: 450 mg via ORAL
  Filled 2020-03-06: qty 6

## 2020-03-06 MED ORDER — LABETALOL HCL 5 MG/ML IV SOLN: 10.0000 mg | INTRAVENOUS | Status: DC | PRN

## 2020-03-06 MED ORDER — ATROPINE SULFATE 1 MG/10ML IJ SOSY
PREFILLED_SYRINGE | INTRAMUSCULAR | Status: AC
  Filled 2020-03-06: qty 10

## 2020-03-06 MED ORDER — MIDAZOLAM HCL 2 MG/ML PO SYRP: 8.0000 mg | ORAL_SOLUTION | Freq: Once | ORAL | Status: DC | PRN

## 2020-03-06 MED ORDER — SODIUM CHLORIDE 0.9 % IV BOLUS
500.0000 mL | Freq: Once | INTRAVENOUS | Status: AC
  Administered 2020-03-06: 500 mL via INTRAVENOUS

## 2020-03-06 MED ORDER — IODIXANOL 320 MG/ML IV SOLN
INTRAVENOUS | Status: DC | PRN
  Administered 2020-03-06: 80 mL via INTRA_ARTERIAL

## 2020-03-06 MED ORDER — METOPROLOL TARTRATE 5 MG/5ML IV SOLN: 2.0000 mg | INTRAVENOUS | Status: DC | PRN

## 2020-03-06 MED ORDER — DOPAMINE-DEXTROSE 3.2-5 MG/ML-% IV SOLN
INTRAVENOUS | Status: AC
  Administered 2020-03-06: 2 ug/kg/min via INTRAVENOUS
  Filled 2020-03-06: qty 250

## 2020-03-06 MED ORDER — MECLIZINE HCL 12.5 MG PO TABS
12.5000 mg | ORAL_TABLET | Freq: Three times a day (TID) | ORAL | Status: DC | PRN
  Filled 2020-03-06: qty 1

## 2020-03-06 MED ORDER — MOMETASONE FURO-FORMOTEROL FUM 200-5 MCG/ACT IN AERO
2.0000 | INHALATION_SPRAY | Freq: Two times a day (BID) | RESPIRATORY_TRACT | Status: DC
  Administered 2020-03-06 – 2020-03-08 (×4): 2 via RESPIRATORY_TRACT
  Filled 2020-03-06: qty 8.8

## 2020-03-06 MED ORDER — PHENYLEPHRINE HCL (PRESSORS) 10 MG/ML IV SOLN
INTRAVENOUS | Status: AC
  Filled 2020-03-06: qty 1

## 2020-03-06 MED ORDER — CARVEDILOL 25 MG PO TABS: 25.0000 mg | ORAL_TABLET | Freq: Two times a day (BID) | ORAL | Status: DC

## 2020-03-06 MED ORDER — HYDRALAZINE HCL 20 MG/ML IJ SOLN: 5.0000 mg | INTRAMUSCULAR | Status: DC | PRN

## 2020-03-06 MED ORDER — SODIUM CHLORIDE 0.9 % IV SOLN
INTRAVENOUS | Status: DC
  Administered 2020-03-06: 1000 mL via INTRAVENOUS

## 2020-03-06 MED ORDER — ISOSORBIDE MONONITRATE ER 60 MG PO TB24
60.0000 mg | ORAL_TABLET | Freq: Every day | ORAL | Status: DC
  Administered 2020-03-06 – 2020-03-08 (×3): 60 mg via ORAL
  Filled 2020-03-06 (×4): qty 1

## 2020-03-06 MED ORDER — DOPAMINE-DEXTROSE 3.2-5 MG/ML-% IV SOLN: 2.0000 ug/kg/min | INTRAVENOUS | Status: DC

## 2020-03-06 MED ORDER — CHLORHEXIDINE GLUCONATE CLOTH 2 % EX PADS
6.0000 | MEDICATED_PAD | Freq: Every day | CUTANEOUS | Status: DC
  Administered 2020-03-07: 6 via TOPICAL

## 2020-03-06 MED ORDER — CEFAZOLIN SODIUM-DEXTROSE 2-4 GM/100ML-% IV SOLN
2.0000 g | Freq: Three times a day (TID) | INTRAVENOUS | Status: AC
  Administered 2020-03-06 – 2020-03-07 (×2): 2 g via INTRAVENOUS
  Filled 2020-03-06 (×3): qty 100

## 2020-03-06 MED ORDER — CLOPIDOGREL BISULFATE 75 MG PO TABS: 300.0000 mg | ORAL_TABLET | ORAL | Status: DC

## 2020-03-06 MED ORDER — HEPARIN SODIUM (PORCINE) 1000 UNIT/ML IJ SOLN
INTRAMUSCULAR | Status: DC | PRN
  Administered 2020-03-06: 8000 [IU] via INTRAVENOUS

## 2020-03-06 MED ORDER — ACETAMINOPHEN 325 MG RE SUPP
325.0000 mg | RECTAL | Status: DC | PRN
  Filled 2020-03-06: qty 2

## 2020-03-06 MED ORDER — CLOPIDOGREL BISULFATE 75 MG PO TABS
75.0000 mg | ORAL_TABLET | Freq: Every day | ORAL | Status: DC
  Administered 2020-03-07 – 2020-03-08 (×2): 75 mg via ORAL
  Filled 2020-03-06 (×2): qty 1

## 2020-03-06 MED ORDER — PHENOL 1.4 % MT LIQD
1.0000 | OROMUCOSAL | Status: DC | PRN
  Filled 2020-03-06: qty 177

## 2020-03-06 MED ORDER — TIOTROPIUM BROMIDE MONOHYDRATE 18 MCG IN CAPS
18.0000 ug | ORAL_CAPSULE | Freq: Every day | RESPIRATORY_TRACT | Status: DC
  Administered 2020-03-06 – 2020-03-08 (×3): 18 ug via RESPIRATORY_TRACT
  Filled 2020-03-06: qty 5

## 2020-03-06 MED ORDER — ONDANSETRON HCL 4 MG/2ML IJ SOLN
4.0000 mg | Freq: Four times a day (QID) | INTRAMUSCULAR | Status: DC | PRN
  Administered 2020-03-06 – 2020-03-07 (×3): 4 mg via INTRAVENOUS
  Filled 2020-03-06 (×3): qty 2

## 2020-03-06 MED ORDER — OXYCODONE HCL 5 MG PO TABS
5.0000 mg | ORAL_TABLET | ORAL | Status: DC | PRN
  Administered 2020-03-06: 5 mg via ORAL

## 2020-03-06 MED ORDER — DOPAMINE-DEXTROSE 3.2-5 MG/ML-% IV SOLN
INTRAVENOUS | Status: AC
  Filled 2020-03-06: qty 250

## 2020-03-06 MED ORDER — DOCUSATE SODIUM 100 MG PO CAPS
100.0000 mg | ORAL_CAPSULE | Freq: Every day | ORAL | Status: DC
  Administered 2020-03-07 – 2020-03-08 (×2): 100 mg via ORAL
  Filled 2020-03-06 (×2): qty 1

## 2020-03-06 MED ORDER — SACUBITRIL-VALSARTAN 24-26 MG PO TABS
2.0000 | ORAL_TABLET | Freq: Two times a day (BID) | ORAL | Status: DC
  Administered 2020-03-06 – 2020-03-08 (×5): 2 via ORAL
  Filled 2020-03-06 (×6): qty 2

## 2020-03-06 MED ORDER — SERTRALINE HCL 100 MG PO TABS
200.0000 mg | ORAL_TABLET | Freq: Every day | ORAL | Status: DC
  Administered 2020-03-06 – 2020-03-08 (×3): 200 mg via ORAL
  Filled 2020-03-06: qty 2
  Filled 2020-03-06 (×3): qty 4
  Filled 2020-03-06 (×2): qty 2

## 2020-03-06 MED ORDER — OXYCODONE-ACETAMINOPHEN 5-325 MG PO TABS
ORAL_TABLET | ORAL | Status: AC
  Filled 2020-03-06: qty 1

## 2020-03-06 MED ORDER — MORPHINE SULFATE (PF) 2 MG/ML IV SOLN
INTRAVENOUS | Status: AC
  Administered 2020-03-06: 2 mg via INTRAVENOUS
  Filled 2020-03-06: qty 1

## 2020-03-06 MED ORDER — TRAZODONE HCL 50 MG PO TABS
50.0000 mg | ORAL_TABLET | Freq: Every day | ORAL | Status: DC
  Administered 2020-03-06 – 2020-03-07 (×2): 50 mg via ORAL
  Filled 2020-03-06 (×2): qty 1

## 2020-03-06 MED ORDER — CEFAZOLIN SODIUM-DEXTROSE 2-4 GM/100ML-% IV SOLN
INTRAVENOUS | Status: AC
  Administered 2020-03-06: 2 g via INTRAVENOUS
  Filled 2020-03-06: qty 100

## 2020-03-06 MED ORDER — DIPHENHYDRAMINE HCL 25 MG PO TABS
25.0000 mg | ORAL_TABLET | Freq: Four times a day (QID) | ORAL | Status: DC | PRN
  Filled 2020-03-06: qty 1

## 2020-03-06 MED ORDER — CEFAZOLIN SODIUM-DEXTROSE 2-4 GM/100ML-% IV SOLN: 2.0000 g | Freq: Once | INTRAVENOUS | Status: AC

## 2020-03-06 MED ORDER — FENTANYL CITRATE (PF) 100 MCG/2ML IJ SOLN
INTRAMUSCULAR | Status: AC
  Filled 2020-03-06: qty 2

## 2020-03-06 MED ORDER — ONDANSETRON 4 MG PO TBDP
4.0000 mg | ORAL_TABLET | Freq: Three times a day (TID) | ORAL | Status: DC | PRN
  Filled 2020-03-06: qty 1

## 2020-03-06 MED ORDER — ADULT MULTIVITAMIN W/MINERALS CH
1.0000 | ORAL_TABLET | Freq: Every day | ORAL | Status: DC
  Administered 2020-03-06 – 2020-03-08 (×3): 1 via ORAL
  Filled 2020-03-06 (×3): qty 1

## 2020-03-06 MED ORDER — KCL IN DEXTROSE-NACL 20-5-0.9 MEQ/L-%-% IV SOLN
INTRAVENOUS | Status: DC
  Filled 2020-03-06 (×4): qty 1000

## 2020-03-06 MED ORDER — ASPIRIN EC 81 MG PO TBEC
81.0000 mg | DELAYED_RELEASE_TABLET | Freq: Every day | ORAL | Status: DC
Start: 2020-03-06 — End: 2020-03-06

## 2020-03-06 MED ORDER — DIPHENHYDRAMINE HCL 50 MG/ML IJ SOLN: 50.0000 mg | Freq: Once | INTRAMUSCULAR | Status: DC | PRN

## 2020-03-06 MED ORDER — ACETAMINOPHEN 325 MG PO TABS
325.0000 mg | ORAL_TABLET | ORAL | Status: DC | PRN
  Administered 2020-03-06 – 2020-03-07 (×2): 650 mg via ORAL
  Filled 2020-03-06 (×2): qty 2

## 2020-03-06 MED ORDER — PANTOPRAZOLE SODIUM 40 MG IV SOLR
40.0000 mg | Freq: Every day | INTRAVENOUS | Status: DC
  Administered 2020-03-06 – 2020-03-07 (×2): 40 mg via INTRAVENOUS
  Filled 2020-03-06 (×2): qty 40

## 2020-03-06 MED ORDER — MORPHINE SULFATE (PF) 4 MG/ML IV SOLN
2.0000 mg | INTRAVENOUS | Status: DC | PRN
  Administered 2020-03-06 (×2): 2 mg via INTRAVENOUS
  Administered 2020-03-07: 4 mg via INTRAVENOUS
  Filled 2020-03-06 (×3): qty 1

## 2020-03-06 MED ORDER — METHYLPREDNISOLONE SODIUM SUCC 125 MG IJ SOLR: 125.0000 mg | Freq: Once | INTRAMUSCULAR | Status: DC | PRN

## 2020-03-06 SURGICAL SUPPLY — 20 items
BALLN VTRAC 4.5X20X135 (BALLOONS) ×2
BALLOON VTRAC 4.5X20X135 (BALLOONS) ×1 IMPLANT
CATH ANGIO 5F PIGTAIL 100CM (CATHETERS) ×2 IMPLANT
CATH BEACON 5 .035 100 H1 TIP (CATHETERS) ×2 IMPLANT
DEVICE EMBOSHIELD NAV6 4.0-7.0 (FILTER) ×2 IMPLANT
DEVICE PRESTO INFLATION (MISCELLANEOUS) ×4 IMPLANT
DEVICE SAFEGUARD 24CM (GAUZE/BANDAGES/DRESSINGS) ×2 IMPLANT
DEVICE STARCLOSE SE CLOSURE (Vascular Products) ×2 IMPLANT
GLIDEWIRE ANGLED SS 035X260CM (WIRE) ×2 IMPLANT
KIT CAROTID MANIFOLD (MISCELLANEOUS) ×2 IMPLANT
NEEDLE ENTRY 21GA 7CM ECHOTIP (NEEDLE) ×2 IMPLANT
PACK ANGIOGRAPHY (CUSTOM PROCEDURE TRAY) ×2 IMPLANT
SET INTRO CAPELLA COAXIAL (SET/KITS/TRAYS/PACK) ×2 IMPLANT
SHEATH BRITE TIP 5FRX11 (SHEATH) ×2 IMPLANT
SHEATH SHUTTLE 6FRX80 (SHEATH) ×2 IMPLANT
STENT XACT CAR 9-7X40X136 (Permanent Stent) ×2 IMPLANT
SYR MEDRAD MARK 7 150ML (SYRINGE) ×2 IMPLANT
TUBING CONTRAST HIGH PRESS 72 (TUBING) ×2 IMPLANT
WIRE G VAS 035X260 STIFF (WIRE) ×2 IMPLANT
WIRE J 3MM .035X145CM (WIRE) ×2 IMPLANT

## 2020-03-06 NOTE — Op Note (Signed)
OPERATIVE NOTE DATE: 03/06/2020  PROCEDURE: 1.  Ultrasound guidance for vascular access right common femoral artery 2.  Placement of a 9 x 7 x 40 exact stent with the use of the NAV-6 embolic protection device in the right internal carotid artery  PRE-OPERATIVE DIAGNOSIS:  1.  Symptomatic right internal carotid artery stenosis. 2.  Temporary loss of vision right eye 3.  Severe COPD 4.  Cardiomyopathy with severe coronary artery disease and CHF  POST-OPERATIVE DIAGNOSIS:  Same as above  SURGEON: Hortencia Pilar  ASSISTANT(S): Dr. Leotis Pain  ANESTHESIA: local/MCS  ESTIMATED BLOOD LOSS: 100 cc  CONTRAST: 80 cc  FLUORO TIME: 5 minutes  MODERATE CONSCIOUS SEDATION TIME: Continuous ECG pulse oximetry and cardiopulmonary monitoring was performed throughout the entire procedure by the interventional radiology nurse total sedation time was 40 minutes  FINDING(S): 1.   Greater than 95% right internal carotid artery stenosis  SPECIMEN(S):   none  INDICATIONS:   Patient is a 74 y.o. female who presents with greater than 95% right internal carotid artery stenosis.  The patient has multiple severe comorbidities as well as previously documented visual changes and carotid artery stenting was felt to be preferred to endarterectomy for that reason.  Risks and benefits were discussed and informed consent was obtained.   DESCRIPTION: After obtaining full informed written consent, the patient was brought back to the vascular suite and placed supine upon the table.  The patient received IV antibiotics prior to induction. Moderate conscious sedation was administered during a face to face encounter with the patient throughout the procedure with my supervision of the RN administering medicines and monitoring the patients vital signs and mental status throughout from the start of the procedure until the patient was taken to the recovery room.    After obtaining adequate sedation, the patient was  prepped and draped in the standard fashion.    A first assistant is required in order to allow for a safe and more efficient operation.  Duties include wire manipulations as well as assistance with pinning the sheath and positioning the detector for proper angle, assistance and deploying the stent in the proper position and appropriate images.  Further duties include assisting with patient positioning during the procedure.  I believe that this procedure requires a first assistant in order for it to be performed at a level in keeping with the high standards of this institution.  The right common femoral artery was visualized with ultrasound and found to be widely patent. It was then accessed under direct ultrasound guidance without difficulty with a micropuncture needle. A permanent image was recorded.  A microwire was then advanced without difficulty under fluoroscopic guidance followed by a micro-sheath.  A J-wire was placed and we then placed a 6 French sheath. The patient was then heparinized and a total of 8000 units of intravenous heparin were given and an ACT was checked to confirm successful anticoagulation.   A pigtail catheter was then placed into the ascending aorta. This showed a type I arch with mild atherosclerotic changes at the origins of the great vessels. The innominate artery was then selectively cannulated without difficulty with a H1 catheter and the catheter advanced into the mid right common carotid artery.  Cervical and cerebral carotid angiography was then performed. There were no obvious intracranial filling defects. The carotid bifurcation demonstrated greater than 95% stenosis of the right internal carotid artery.  I then advanced into the external carotid artery with a Glidewire and the H1 catheter  and then exchanged for the Amplatz Super Stiff wire. Over the Amplatz Super Stiff wire, a 6 [place] shuttle sheath was placed into the mid common carotid artery. I then used the NAV-6   Embolic protection device and crossed the lesion and parked this in the distal internal carotid artery at the base of the skull.  I then selected a 9 x 7 x 40 exact stent. This was deployed across the lesion encompassing it in its entirety. A 4.5 x 20 length balloon was used to post dilate the stent. Only about a 30% residual stenosis was present after angioplasty. Completion angiogram showed normal intracranial filling without new defects. At this point I elected to terminate the procedure. The sheath was removed and StarClose closure device was deployed in the right femoral artery with excellent hemostatic result. The patient was taken to the recovery room in stable condition having tolerated the procedure well.  COMPLICATIONS: none  CONDITION: stable  Hortencia Pilar 03/06/2020 11:00 AM   This note was created with Dragon Medical transcription system. Any errors in dictation are purely unintentional.

## 2020-03-06 NOTE — H&P (Signed)
[facility] History & Physical Update  The patient was interviewed and re-examined.  The patient's previous History and Physical has been reviewed and is unchanged.  There is no change in the plan of care. We plan to proceed with the scheduled procedure.  Hortencia Pilar, MD  03/06/2020, 9:28 AM

## 2020-03-07 DIAGNOSIS — I6521 Occlusion and stenosis of right carotid artery: Principal | ICD-10-CM

## 2020-03-07 LAB — CBC
HCT: 38.8 % (ref 36.0–46.0)
Hemoglobin: 12.3 g/dL (ref 12.0–15.0)
MCH: 27.5 pg (ref 26.0–34.0)
MCHC: 31.7 g/dL (ref 30.0–36.0)
MCV: 86.6 fL (ref 80.0–100.0)
Platelets: 133 10*3/uL — ABNORMAL LOW (ref 150–400)
RBC: 4.48 MIL/uL (ref 3.87–5.11)
RDW: 14.9 % (ref 11.5–15.5)
WBC: 7.4 10*3/uL (ref 4.0–10.5)
nRBC: 0 % (ref 0.0–0.2)

## 2020-03-07 LAB — BASIC METABOLIC PANEL
Anion gap: 7 (ref 5–15)
BUN: 11 mg/dL (ref 8–23)
CO2: 28 mmol/L (ref 22–32)
Calcium: 8.3 mg/dL — ABNORMAL LOW (ref 8.9–10.3)
Chloride: 106 mmol/L (ref 98–111)
Creatinine, Ser: 0.88 mg/dL (ref 0.44–1.00)
GFR calc Af Amer: >60 mL/min (ref >60)
GFR calc non Af Amer: >60 mL/min (ref >60)
Glucose, Bld: 124 mg/dL — ABNORMAL HIGH (ref 70–99)
Potassium: 4.5 mmol/L (ref 3.5–5.1)
Sodium: 141 mmol/L (ref 135–145)

## 2020-03-07 LAB — GLUCOSE, CAPILLARY
Glucose-Capillary: 91 mg/dL (ref 70–99)
Glucose-Capillary: 73 mg/dL (ref 70–99)
Glucose-Capillary: 105 mg/dL — ABNORMAL HIGH (ref 70–99)
Glucose-Capillary: 104 mg/dL — ABNORMAL HIGH (ref 70–99)

## 2020-03-07 MED ORDER — INSULIN ASPART 100 UNIT/ML ~~LOC~~ SOLN: 0.0000 [IU] | Freq: Three times a day (TID) | SUBCUTANEOUS | Status: DC

## 2020-03-07 MED ORDER — INSULIN ASPART 100 UNIT/ML ~~LOC~~ SOLN: 0.0000 [IU] | Freq: Every day | SUBCUTANEOUS | Status: DC

## 2020-03-07 MED ORDER — METOCLOPRAMIDE HCL 5 MG/ML IJ SOLN
5.0000 mg | Freq: Four times a day (QID) | INTRAMUSCULAR | Status: AC
  Administered 2020-03-07 (×2): 5 mg via INTRAVENOUS
  Filled 2020-03-07 (×2): qty 2

## 2020-03-07 NOTE — Progress Notes (Signed)
[facility] Daily Progress Note   Subjective: 03/06/20: 1.  Ultrasound guidance for vascular access right common femoral artery 2.  Placement of a 9 x 7 x 40 exact stent with the use of the NAV-6 embolic protection device in the right internal carotid artery  Patient complaining of continued nausea.  Unable to tolerate clears at this time.  Objective: Vitals:   03/07/20 0300 03/07/20 0400 03/07/20 0500 03/07/20 0600  BP: (!) 100/52 (!) 109/48 (!) 108/54   Pulse: 60 62 61 64  Resp: 13 14 16 14   Temp:  98.2 F (36.8 C)    TempSrc:  Oral    SpO2: 97% 99% 98% 98%    Intake/Output Summary (Last 24 hours) at 03/07/2020 1053 Last data filed at 03/07/2020 0900 Gross per 24 hour  Intake 727.38 ml  Output 950 ml  Net -222.62 ml   Physical Exam: Alert and oriented x3, no acute distress Face: Symmetrical, tongue midline Neck: No swelling, bruising trachea midline Cardiovascular: Regular rate and rhythm Pulmonary: Clear to auscultation bilaterally Abdomen: Soft, nontender, nondistended, bowel sounds Groin: Access site clean dry and intact Extremity: Warm distally in toes Neurological: Tach, no deficits   Laboratory: CBC    Component Value Date/Time   WBC 7.4 03/07/2020 0414   HGB 12.3 03/07/2020 0414   HGB 13.3 12/18/2019 1504   HCT 38.8 03/07/2020 0414   HCT 41.4 12/18/2019 1504   PLT 133 (L) 03/07/2020 0414   PLT 177 12/18/2019 1504   BMET    Component Value Date/Time   NA 141 03/07/2020 0414   NA 140 12/18/2019 1504   NA 134 (L) 01/15/2014 1556   K 4.5 03/07/2020 0414   K 4.2 01/15/2014 1556   CL 106 03/07/2020 0414   CL 102 01/15/2014 1556   CO2 28 03/07/2020 0414   CO2 26 01/15/2014 1556   GLUCOSE 124 (H) 03/07/2020 0414   GLUCOSE 270 (H) 01/15/2014 1556   BUN 11 03/07/2020 0414   BUN 12 12/18/2019 1504   BUN 11 01/15/2014 1556   CREATININE 0.88 03/07/2020 0414   CREATININE 0.52 (L) 01/15/2014 1556   CALCIUM 8.3 (L) 03/07/2020 0414    CALCIUM 9.2 01/15/2014 1556   GFRNONAA >60 03/07/2020 0414   GFRNONAA >60 01/15/2014 1556   GFRAA >60 03/07/2020 0414   GFRAA >60 01/15/2014 1556   Assessment/Planning: The patient is a 74 year old female status post right carotid stenting - POD#1  1) patient still on dopamine this AM.  Hoping to wean today 2) discontinue Foley 3) KVO 4) added Reglan every 8 hours x2 doses 5) out of bed to chair, ambulation 6) hopeful for discharge tomorrow  Discussed with Dr. Eber Hong Caspar Favila PA-C 03/07/2020 10:53 AM

## 2020-03-08 LAB — BASIC METABOLIC PANEL
Anion gap: 6 (ref 5–15)
BUN: 12 mg/dL (ref 8–23)
CO2: 29 mmol/L (ref 22–32)
Calcium: 8.2 mg/dL — ABNORMAL LOW (ref 8.9–10.3)
Chloride: 106 mmol/L (ref 98–111)
Creatinine, Ser: 0.89 mg/dL (ref 0.44–1.00)
GFR calc Af Amer: >60 mL/min (ref >60)
GFR calc non Af Amer: >60 mL/min (ref >60)
Glucose, Bld: 118 mg/dL — ABNORMAL HIGH (ref 70–99)
Potassium: 4.2 mmol/L (ref 3.5–5.1)
Sodium: 141 mmol/L (ref 135–145)

## 2020-03-08 LAB — CBC
HCT: 35.3 % — ABNORMAL LOW (ref 36.0–46.0)
Hemoglobin: 11.1 g/dL — ABNORMAL LOW (ref 12.0–15.0)
MCH: 27.5 pg (ref 26.0–34.0)
MCHC: 31.4 g/dL (ref 30.0–36.0)
MCV: 87.4 fL (ref 80.0–100.0)
Platelets: 108 10*3/uL — ABNORMAL LOW (ref 150–400)
RBC: 4.04 MIL/uL (ref 3.87–5.11)
RDW: 14.9 % (ref 11.5–15.5)
WBC: 4.6 10*3/uL (ref 4.0–10.5)
nRBC: 0 % (ref 0.0–0.2)

## 2020-03-08 LAB — GLUCOSE, CAPILLARY
Glucose-Capillary: 84 mg/dL (ref 70–99)
Glucose-Capillary: 89 mg/dL (ref 70–99)

## 2020-03-08 LAB — MAGNESIUM: Magnesium: 2.1 mg/dL (ref 1.7–2.4)

## 2020-03-08 MED ORDER — CLOPIDOGREL BISULFATE 75 MG PO TABS: 75.0000 mg | ORAL_TABLET | Freq: Every day | ORAL | 3 refills | Status: DC

## 2020-03-08 NOTE — Progress Notes (Signed)
IV's discontinued, pressure held and dressing applied. Awaiting ride home

## 2020-03-08 NOTE — Progress Notes (Signed)
1140 Discharge instructions given. Questions answered. Discharged home with son.

## 2020-03-08 NOTE — Discharge Summary (Signed)
[facility]    Discharge Summary  Patient ID:  Sarah White MRN: [id number] DOB/AGE: 72/24/1947 72 y.o.  Admit date: 03/06/2020 Discharge date: 03/08/2020 Date of Surgery: 03/06/2020 Surgeon: Surgeon(s): Schnier, Dolores Lory, MD Algernon Huxley, MD  Admission Diagnosis: Symptomatic carotid artery narrowing without infarction [I65.29]  Discharge Diagnoses:  Symptomatic carotid artery narrowing without infarction [I65.29]  Secondary Diagnoses: Past Medical History:  Diagnosis Date  . Acute pancreatitis 09/09/2018  . Acute respiratory failure with hypoxia and hypercarbia ([place]) 07/22/2015  . Cancer ([place])    uterine  . CHF (congestive heart failure) ([place])   . COPD (chronic obstructive pulmonary disease) ([place])   . Diabetes mellitus without complication ([place])   . Encephalopathy acute 07/22/2015  . Hypertension   . Pneumonia    November 2016  . Pulmonary edema 07/22/2015  . Squamous cell cancer of skin of upper arm, right    Procedure(s): 1. Ultrasound guidance for vascular access right commonfemoral artery 2. Placement of a 9 x 7 x 40 exact stent with the use of the NAV-6 embolic protection device in theright internalcarotid artery  Discharged Condition: Good  HPI / Hospital Course:  Patient is a 74 y.o. female who presents with greater than 95% right internal carotid artery stenosis.  The patient has multiple severe comorbidities as well as previously documented visual changes and carotid artery stenting was felt to be preferred to endarterectomy for that reason.  Risks and benefits were discussed and informed consent was obtained.   On March 06, 2020 the patient underwent,  1.  Ultrasound guidance for vascular access right common femoral artery 2.  Placement of a 9 x 7 x 40 exact stent with the use of the NAV-6 embolic protection device in the right internal carotid artery  She tolerated the procedure well was transferred to the ICU.  Post procedure,  the patient needed some pressor support (dopamine).  This was weaned overnight.  Patient also experienced some nausea which improved with Reglan.  Postop day 2, the patient was tolerating a regular diet, urinating, pain was controlled with the use of p.o. pain medication she was ambulating at baseline.  Day of discharge, the patient was afebrile with stable vital signs.  Physical exam: Alert and oriented x3, no acute distress Face:Symmetrical,tongue midline Neck:No swelling, bruising trachea midline Cardiovascular:Regular rate and rhythm Pulmonary:Clear to auscultation bilaterally Abdomen:Soft, nontender, nondistended,bowel sounds Groin:Access site clean dry and intact Extremity:Warm distally in toes Neurological:Tach, no deficits  Labs: As below  Complications: None  Consults: None  Significant Diagnostic Studies: CBC Lab Results  Component Value Date   WBC 4.6 03/08/2020   HGB 11.1 (L) 03/08/2020   HCT 35.3 (L) 03/08/2020   MCV 87.4 03/08/2020   PLT 108 (L) 03/08/2020   BMET    Component Value Date/Time   NA 141 03/08/2020 0505   NA 140 12/18/2019 1504   NA 134 (L) 01/15/2014 1556   K 4.2 03/08/2020 0505   K 4.2 01/15/2014 1556   CL 106 03/08/2020 0505   CL 102 01/15/2014 1556   CO2 29 03/08/2020 0505   CO2 26 01/15/2014 1556   GLUCOSE 118 (H) 03/08/2020 0505   GLUCOSE 270 (H) 01/15/2014 1556   BUN 12 03/08/2020 0505   BUN 12 12/18/2019 1504   BUN 11 01/15/2014 1556   CREATININE 0.89 03/08/2020 0505   CREATININE 0.52 (L) 01/15/2014 1556   CALCIUM 8.2 (L) 03/08/2020 0505   CALCIUM 9.2 01/15/2014 1556   GFRNONAA >60  03/08/2020 0505   GFRNONAA >60 01/15/2014 1556   GFRAA >60 03/08/2020 0505   GFRAA >60 01/15/2014 1556   COAG Lab Results  Component Value Date   INR 1.06 11/26/2016   INR 1.06 11/26/2016   INR 0.9 01/15/2014   Disposition:  Discharge to :Home  Allergies as of 03/08/2020      Reactions   Amlodipine Swelling   Atorvastatin Other  (See Comments)   myalgia   Codeine Nausea And Vomiting   Hctz [hydrochlorothiazide] Other (See Comments)   Hypokalemia   Lisinopril Other (See Comments)   Dizziness   Metformin And Related Nausea Only   Nausea, dizziness      Medication List    TAKE these medications   albuterol 108 (90 Base) MCG/ACT inhaler Commonly known as: VENTOLIN HFA Inhale 2 puffs into the lungs every 6 (six) hours as needed for wheezing or shortness of breath.   amiodarone 200 MG tablet Commonly known as: PACERONE Take 100 mg by mouth daily.   aspirin EC 81 MG tablet Take 81 mg by mouth at bedtime.   budesonide-formoterol 160-4.5 MCG/ACT inhaler Commonly known as: SYMBICORT Inhale 2 puffs into the lungs 2 (two) times daily.   carvedilol 12.5 MG tablet Commonly known as: COREG TAKE 2 TABLETS BY MOUTH TWICE DAILY WITH A MEAL What changed:   how much to take  how to take this  when to take this  additional instructions   clopidogrel 75 MG tablet Commonly known as: PLAVIX Take 1 tablet (75 mg total) by mouth daily at 6 (six) AM. Start taking on: March 09, 2020   cyclobenzaprine 10 MG tablet Commonly known as: FLEXERIL Take 1 tablet (10 mg total) by mouth 3 (three) times daily as needed for muscle spasms. DO NOT DRIVE ON THIS MEDICINE- WILL MAKE YOU SLEEPY   diphenhydrAMINE 25 MG tablet Commonly known as: BENADRYL Take 25 mg by mouth every 6 (six) hours as needed for allergies.   furosemide 40 MG tablet Commonly known as: LASIX Take 20 mg by mouth daily.   isosorbide mononitrate 60 MG 24 hr tablet Commonly known as: IMDUR Take 1 tablet (60 mg total) by mouth daily.   Lidocaine 4 % Ptch Apply 1 patch topically every 12 (twelve) hours. What changed:   how to take this  when to take this  reasons to take this   meclizine 12.5 MG tablet Commonly known as: ANTIVERT Take 1 tablet (12.5 mg total) by mouth 3 (three) times daily as needed for dizziness.   multivitamin with  minerals Tabs tablet Take 1 tablet by mouth daily.   nitroGLYCERIN 0.4 MG SL tablet Commonly known as: NITROSTAT Place 1 tablet (0.4 mg total) under the tongue every 5 (five) minutes as needed for chest pain.   ondansetron 4 MG disintegrating tablet Commonly known as: ZOFRAN-ODT Take 4 mg by mouth every 8 (eight) hours as needed for nausea or vomiting.   rosuvastatin 20 MG tablet Commonly known as: Crestor Take 1 tablet (20 mg total) by mouth daily.   sacubitril-valsartan 24-26 MG Commonly known as: ENTRESTO Take 2 tablets by mouth 2 (two) times daily.   sertraline 100 MG tablet Commonly known as: ZOLOFT Take 2 tablets by mouth once daily   silver sulfADIAZINE 1 % cream Commonly known as: Silvadene Apply 1 application topically daily. What changed:   when to take this  reasons to take this   Spiriva HandiHaler 18 MCG inhalation capsule Generic drug: tiotropium Place 1 capsule (18 mcg  total) into inhaler and inhale daily.   traZODone 50 MG tablet Commonly known as: DESYREL TAKE 1/2 TO 1 (ONE-HALF TO ONE) TABLET BY MOUTH AT BEDTIME AS NEEDED FOR SLEEP What changed: See the new instructions.   triamcinolone ointment 0.5 % Commonly known as: KENALOG Apply 1 application topically 2 (two) times daily.   Trulicity 1.5 KW/4.0XB Sopn Generic drug: Dulaglutide Inject 1.5 mg into the skin once a week. What changed: additional instructions      Verbal and written Discharge instructions given to the patient. Wound care per Discharge AVS  Follow-up Information    Schnier, Dolores Lory, MD Follow up in 1 month(s).   Specialties: Vascular Surgery, Cardiology, Radiology, Vascular Surgery Why: Can see Schnier or Arna Medici. Will need carotid duplex with visit. Contact information: [place] [address] [phone]              Signed: Sela Hua, PA-C  03/08/2020, 11:10 AM

## 2020-03-08 NOTE — Discharge Instructions (Signed)
You may shower.  Keep your groins clean and dry. No heavy lifting greater than 10 pounds for 2 weeks.

## 2020-03-11 ENCOUNTER — Other Ambulatory Visit: Payer: Self-pay | Admitting: Family Medicine

## 2020-03-11 NOTE — Telephone Encounter (Signed)
Requested Prescriptions  Pending Prescriptions Disp Refills   SPIRIVA HANDIHALER 18 MCG inhalation capsule [Pharmacy Med Name: Spiriva HandiHaler 18 MCG Inhalation Capsule] 90 capsule 0    Sig: PLACE 1 CAPSULE INTO INHALER AND INHALE DAILY     Pulmonology:  Anticholinergic Agents Passed - 03/11/2020  2:05 PM      Passed - Valid encounter within last 12 months    Recent Outpatient Visits          3 weeks ago Skin lesion   [place], [name], DO   1 month ago Skin lesion   [facility] [place], [name], DO   1 month ago Type 2 diabetes mellitus with stage 3 chronic kidney disease, without long-term current use of insulin, unspecified whether stage 3a or 3b CKD ([place])   [place], [name], DO   2 months ago COPD exacerbation [facility])   [place], [place], DO   4 months ago [place], oral   [facility], Lilia Argue, [place]      Future Appointments            In 1 month  [place], [place]   In 1 month Johnson, [name], DO [employer]

## 2020-03-20 ENCOUNTER — Telehealth: Payer: Medicare Other | Admitting: General Practice

## 2020-03-20 ENCOUNTER — Other Ambulatory Visit: Payer: Self-pay | Admitting: Family Medicine

## 2020-03-20 ENCOUNTER — Ambulatory Visit (INDEPENDENT_AMBULATORY_CARE_PROVIDER_SITE_OTHER): Payer: Medicare Other | Admitting: General Practice

## 2020-03-20 DIAGNOSIS — E782 Mixed hyperlipidemia: Secondary | ICD-10-CM | POA: Diagnosis not present

## 2020-03-20 DIAGNOSIS — I5021 Acute systolic (congestive) heart failure: Secondary | ICD-10-CM | POA: Diagnosis not present

## 2020-03-20 DIAGNOSIS — I2510 Atherosclerotic heart disease of native coronary artery without angina pectoris: Secondary | ICD-10-CM

## 2020-03-20 DIAGNOSIS — I6523 Occlusion and stenosis of bilateral carotid arteries: Secondary | ICD-10-CM

## 2020-03-20 DIAGNOSIS — N183 Chronic kidney disease, stage 3 unspecified: Secondary | ICD-10-CM | POA: Diagnosis not present

## 2020-03-20 DIAGNOSIS — J449 Chronic obstructive pulmonary disease, unspecified: Secondary | ICD-10-CM | POA: Diagnosis not present

## 2020-03-20 DIAGNOSIS — E1122 Type 2 diabetes mellitus with diabetic chronic kidney disease: Secondary | ICD-10-CM | POA: Diagnosis not present

## 2020-03-20 DIAGNOSIS — I129 Hypertensive chronic kidney disease with stage 1 through stage 4 chronic kidney disease, or unspecified chronic kidney disease: Secondary | ICD-10-CM | POA: Diagnosis not present

## 2020-03-20 DIAGNOSIS — F321 Major depressive disorder, single episode, moderate: Secondary | ICD-10-CM

## 2020-03-20 DIAGNOSIS — F419 Anxiety disorder, unspecified: Secondary | ICD-10-CM

## 2020-03-20 DIAGNOSIS — I951 Orthostatic hypotension: Secondary | ICD-10-CM

## 2020-03-20 NOTE — Chronic Care Management (AMB) (Signed)
Chronic Care Management   Follow Up Note   03/20/2020 Name: Sarah White MRN: [id number] DOB: Oct 26, 1945  Referred by: Valerie Roys, DO Reason for referral : Chronic Care Management (RNCM: Follow up Chronic Disease Management and Care Coordination Needs)   Sarah White is a 74 y.o. year old female who is a primary care patient of Valerie Roys, DO. The CCM team was consulted for assistance with chronic disease management and care coordination needs.    Review of patient status, including review of consultants reports, relevant laboratory and other test results, and collaboration with appropriate care team members and the patient's provider was performed as part of comprehensive patient evaluation and provision of chronic care management services.    SDOH (Social Determinants of Health) assessments performed: Yes See Care Plan activities for detailed interventions related to [facility])     Outpatient Encounter Medications as of 03/20/2020  Medication Sig   albuterol (VENTOLIN HFA) 108 (90 Base) MCG/ACT inhaler Inhale 2 puffs into the lungs every 6 (six) hours as needed for wheezing or shortness of breath. (Patient not taking: Reported on 02/23/2020)   amiodarone (PACERONE) 200 MG tablet Take 100 mg by mouth daily.    aspirin EC 81 MG tablet Take 81 mg by mouth at bedtime.    budesonide-formoterol (SYMBICORT) 160-4.5 MCG/ACT inhaler Inhale 2 puffs into the lungs 2 (two) times daily.   carvedilol (COREG) 12.5 MG tablet TAKE 2 TABLETS BY MOUTH TWICE DAILY WITH A MEAL (Patient taking differently: Take 25 mg by mouth 2 (two) times daily with a meal. )   clopidogrel (PLAVIX) 75 MG tablet Take 1 tablet (75 mg total) by mouth daily at 6 (six) AM.   cyclobenzaprine (FLEXERIL) 10 MG tablet Take 1 tablet (10 mg total) by mouth 3 (three) times daily as needed for muscle spasms. DO NOT DRIVE ON THIS MEDICINE- WILL MAKE YOU SLEEPY   diphenhydrAMINE (BENADRYL) 25 MG tablet Take 25 mg by mouth  every 6 (six) hours as needed for allergies.    Dulaglutide (TRULICITY) 1.5 WU/9.8JX SOPN Inject 1.5 mg into the skin once a week. (Patient taking differently: Inject 1.5 mg into the skin once a week. Friday)   furosemide (LASIX) 40 MG tablet Take 20 mg by mouth daily.    isosorbide mononitrate (IMDUR) 60 MG 24 hr tablet Take 1 tablet (60 mg total) by mouth daily.   Lidocaine 4 % PTCH Apply 1 patch topically every 12 (twelve) hours. (Patient taking differently: Apply 1 patch topically daily as needed (pain). )   meclizine (ANTIVERT) 12.5 MG tablet Take 1 tablet (12.5 mg total) by mouth 3 (three) times daily as needed for dizziness.   Multiple Vitamin (MULTIVITAMIN WITH MINERALS) TABS tablet Take 1 tablet by mouth daily.   nitroGLYCERIN (NITROSTAT) 0.4 MG SL tablet Place 1 tablet (0.4 mg total) under the tongue every 5 (five) minutes as needed for chest pain.   ondansetron (ZOFRAN-ODT) 4 MG disintegrating tablet Take 4 mg by mouth every 8 (eight) hours as needed for nausea or vomiting.   rosuvastatin (CRESTOR) 20 MG tablet Take 1 tablet (20 mg total) by mouth daily.   sacubitril-valsartan (ENTRESTO) 24-26 MG Take 2 tablets by mouth 2 (two) times daily.    sertraline (ZOLOFT) 100 MG tablet Take 2 tablets by mouth once daily   silver sulfADIAZINE (SILVADENE) 1 % cream Apply 1 application topically daily. (Patient taking differently: Apply 1 application topically daily as needed ([place]). )   SPIRIVA HANDIHALER 18 MCG  inhalation capsule PLACE 1 CAPSULE INTO INHALER AND INHALE DAILY   traZODone (DESYREL) 50 MG tablet TAKE 1/2 TO 1 (ONE-HALF TO ONE) TABLET BY MOUTH AT BEDTIME AS NEEDED FOR SLEEP (Patient taking differently: Take 50 mg by mouth at bedtime. )   triamcinolone ointment (KENALOG) 0.5 % Apply 1 application topically 2 (two) times daily. (Patient not taking: Reported on 02/23/2020)   No facility-administered encounter medications on file as of 03/20/2020.     Objective:   Goals  Addressed              This Visit's Progress     RNCM: Pt-"They said it happened because of the fluid around my heart" (pt-stated)        CARE PLAN ENTRY (see longtitudinal plan of care for additional care plan information)  Current Barriers:   Chronic Disease Management support, education, and care coordination needs related to CHF, CAD, HLD, COPD, DMII, Anxiety, and Depression  Clinical Goal(s) related to CHF, CAD, HLD, COPD, DMII, Anxiety, and Depression:  Over the next 120 days, patient will:   Work with the care management team to address educational, disease management, and care coordination needs   Begin or continue self health monitoring activities as directed today Measure and record cbg (blood glucose) 3 times weekly or more if taking steroids, Measure and record blood pressure 5 times per week, Measure and record weight daily, and adhere to a heart healthy/ADA diet  Call provider office for new or worsened signs and symptoms Blood glucose findings outside established parameters, Blood pressure findings outside established parameters, Weight outside established parameters, Oxygen saturation lower than established parameter, Chest pain, Shortness of breath, and New or worsened symptom related to depression and other chronic conditions  Call care management team with questions or concerns  Verbalize basic understanding of patient centered plan of care established today  Interventions related to CHF, CAD, HLD, COPD, DMII, Anxiety, and Depression:   Evaluation of current treatment plans and patient's adherence to plan as established by provider. The patient had a heart catheterization on 01/23/2020.  She is doing well. Has blockages but states she is not going to elect to have surgery. The patient verbalized she goes back to see Dr. Clayborn Bigness on 01-31-2020 and will discuss treatment options.   She had called the office yesterday because her leg was numb.  They instructed her to "walk  it out".  The patient denies any new issues with numbness of leg today.  The patient was getting ready to call the office today because she was reading her instructions on caring for the insertion site and was confused. The discharge instructions AVS was pulled up and instructions provided to the patient on her level of understanding. The patient states she understands how to care for the site now. The patient states the gauze is dry and intact. Review of signs and symptoms of infection and to call Dr. Clayborn Bigness for changes in site or worsening condition. 03-20-2020: The patient had a right carotid PTA with stent placement on 03-06-2020.  The patient was in the hospital for 3 days. She is doing well and has no new concerns at this time. No s/s of infection at the site. Says her headaches have not gone away but feels it is due to arthritis. The patient follows up with Dr. Clayborn Bigness in August.   Assessed patient understanding of disease states.  The patient has a good understanding of her chronic conditions. The patient states she did not have changes  in her weight at home. Review of her normal heart failure signs and symptoms. The patient has not weighed today but her weight is staying around 176.  The patient usually waits until later in the morning to weigh because of her balance first thing in the am. 03-20-2020: the patient states that her wt is up and down but not ever < or > 2 pounds. The patient knows to call the provider for weight changes > or < than parameters set by the provider.   Assessed patient's education and care coordination needs.  The patient has several upcoming appointments but has them all written down. The patient has to see a vein specialist because she has bilateral blockages in her carotid arteries. The patient will make an appointment to see the specialist.  The patient has a follow up appointment with Dr. Clayborn Bigness on 01-31-2020.  03-20-2020: The patient states that she does not have to have  the left side done that it is okay. She has had no problems with the right side and is recovering well post procedure.   Provided disease specific education to patient.  Education on watching sodium content of foods. The patient verbalized she is doing a better job at this. The patient has only been checking her blood sugars every 3 days. The patient had not checked her blood sugar this am but yesterday before her heart catheterization it was 94.  Education on s/s of hypoglycemia/hyperglycemia and checking more frequently if she feels differently.  The patient blood pressure is stable. She is weighing daily and also she is taking her blood pressure regularly and recording. 03-20-2020: The patient verbalized her blood sugars are ranging 110-140.  The patient says when she was in the hospital that she had some low readings and they had to give her orange juice.  Collaborated with appropriate clinical care team members regarding patient needs.  Will refer LCSW for assistance with stressors and ways to relieve anxiety to reduce the incidence of smoking. Also the patient feels she is being a burden to her son. She denies suicidal ideation but says he has put his life on hold because of her. Education and support given today.  On going support and education from pharmacy for questions/concerns related to new medications. Will continue to monitor for changes.   Review of upcoming appointments: Sees Dr. Clayborn Bigness on August and Dr. Wynetta Emery on 04-18-2020, sees the specialist on 04-08-2020.  CCM team numbers and roles discussed with the patient and she agrees to work with the team.   Patient Self Care Activities related to CHF, CAD, HLD, COPD, DMII, Anxiety, and Depression:   Patient is unable to independently self-manage chronic health conditions  Please see past updates related to this goal by clicking on the "Past Updates" button in the selected goal          Plan:   The care management team will reach out  to the patient again over the next 30 to 60 days.    Noreene Larsson RN, MSN, [facility] Mobile: [phone]

## 2020-03-20 NOTE — H&P (Signed)
[facility] History & Physical Update  The patient was interviewed and re-examined.  The patient's previous History and Physical has been reviewed and is unchanged.  There is no change in the plan of care based on the progress not from office dated 02/22/2020. We plan to proceed with the scheduled procedure.  Hortencia Pilar, MD  03/20/2020, 1:26 PM

## 2020-03-20 NOTE — Patient Instructions (Signed)
Visit Information  Goals Addressed              This Visit's Progress   .  RNCM: Pt-"They said it happened because of the fluid around my heart" (pt-stated)        CARE PLAN ENTRY (see longtitudinal plan of care for additional care plan information)  Current Barriers:  . Chronic Disease Management support, education, and care coordination needs related to CHF, CAD, HLD, COPD, DMII, Anxiety, and Depression  Clinical Goal(s) related to CHF, CAD, HLD, COPD, DMII, Anxiety, and Depression:  Over the next 120 days, patient will:  . Work with the care management team to address educational, disease management, and care coordination needs  . Begin or continue self health monitoring activities as directed today Measure and record cbg (blood glucose) 3 times weekly or more if taking steroids, Measure and record blood pressure 5 times per week, Measure and record weight daily, and adhere to a heart healthy/ADA diet . Call provider office for new or worsened signs and symptoms Blood glucose findings outside established parameters, Blood pressure findings outside established parameters, Weight outside established parameters, Oxygen saturation lower than established parameter, Chest pain, Shortness of breath, and New or worsened symptom related to depression and other chronic conditions . Call care management team with questions or concerns . Verbalize basic understanding of patient centered plan of care established today  Interventions related to CHF, CAD, HLD, COPD, DMII, Anxiety, and Depression:  . Evaluation of current treatment plans and patient's adherence to plan as established by provider. The patient had a heart catheterization on 01/23/2020.  She is doing well. Has blockages but states she is not going to elect to have surgery. The patient verbalized she goes back to see Dr. Clayborn Bigness on 01-31-2020 and will discuss treatment options.   She had called the office yesterday because her leg was numb.   They instructed her to "walk it out".  The patient denies any new issues with numbness of leg today.  The patient was getting ready to call the office today because she was reading her instructions on caring for the insertion site and was confused. The discharge instructions AVS was pulled up and instructions provided to the patient on her level of understanding. The patient states she understands how to care for the site now. The patient states the gauze is dry and intact. Review of signs and symptoms of infection and to call Dr. Clayborn Bigness for changes in site or worsening condition. 03-20-2020: The patient had a right carotid PTA with stent placement on 03-06-2020.  The patient was in the hospital for 3 days. She is doing well and has no new concerns at this time. No s/s of infection at the site. Says her headaches have not gone away but feels it is due to arthritis. The patient follows up with Dr. Clayborn Bigness in August.  . Assessed patient understanding of disease states.  The patient has a good understanding of her chronic conditions. The patient states she did not have changes in her weight at home. Review of her normal heart failure signs and symptoms. The patient has not weighed today but her weight is staying around 176.  The patient usually waits until later in the morning to weigh because of her balance first thing in the am. 03-20-2020: the patient states that her wt is up and down but not ever < or > 2 pounds. The patient knows to call the provider for weight changes > or < than  parameters set by the provider.  . Assessed patient's education and care coordination needs.  The patient has several upcoming appointments but has them all written down. The patient has to see a vein specialist because she has bilateral blockages in her carotid arteries. The patient will make an appointment to see the specialist.  The patient has a follow up appointment with Dr. Clayborn Bigness on 01-31-2020.  03-20-2020: The patient states that  she does not have to have the left side done that it is okay. She has had no problems with the right side and is recovering well post procedure.  . Provided disease specific education to patient.  Education on watching sodium content of foods. The patient verbalized she is doing a better job at this. The patient has only been checking her blood sugars every 3 days. The patient had not checked her blood sugar this am but yesterday before her heart catheterization it was 94.  Education on s/s of hypoglycemia/hyperglycemia and checking more frequently if she feels differently.  The patient blood pressure is stable. She is weighing daily and also she is taking her blood pressure regularly and recording. 03-20-2020: The patient verbalized her blood sugars are ranging 110-140.  The patient says when she was in the hospital that she had some low readings and they had to give her orange juice. Nash Dimmer with appropriate clinical care team members regarding patient needs.  Will refer LCSW for assistance with stressors and ways to relieve anxiety to reduce the incidence of smoking. Also the patient feels she is being a burden to her son. She denies suicidal ideation but says he has put his life on hold because of her. Education and support given today.  On going support and education from pharmacy for questions/concerns related to new medications. Will continue to monitor for changes.  . Review of upcoming appointments: Sees Dr. Clayborn Bigness on August and Dr. Wynetta Emery on 04-18-2020, sees the specialist on 04-08-2020. Marland Kitchen CCM team numbers and roles discussed with the patient and she agrees to work with the team.   Patient Self Care Activities related to CHF, CAD, HLD, COPD, DMII, Anxiety, and Depression:  . Patient is unable to independently self-manage chronic health conditions  Please see past updates related to this goal by clicking on the "Past Updates" button in the selected goal         Patient verbalizes  understanding of instructions provided today.   The care management team will reach out to the patient again over the next 30 to 60 days.   Noreene Larsson RN, MSN, [facility] Mobile: [phone]

## 2020-03-26 ENCOUNTER — Telehealth (INDEPENDENT_AMBULATORY_CARE_PROVIDER_SITE_OTHER): Payer: Self-pay

## 2020-03-26 ENCOUNTER — Ambulatory Visit: Payer: Medicare Other | Admitting: Pharmacist

## 2020-03-26 DIAGNOSIS — F321 Major depressive disorder, single episode, moderate: Secondary | ICD-10-CM

## 2020-03-26 DIAGNOSIS — E1122 Type 2 diabetes mellitus with diabetic chronic kidney disease: Secondary | ICD-10-CM

## 2020-03-26 DIAGNOSIS — E782 Mixed hyperlipidemia: Secondary | ICD-10-CM

## 2020-03-26 DIAGNOSIS — I129 Hypertensive chronic kidney disease with stage 1 through stage 4 chronic kidney disease, or unspecified chronic kidney disease: Secondary | ICD-10-CM | POA: Diagnosis not present

## 2020-03-26 DIAGNOSIS — I5021 Acute systolic (congestive) heart failure: Secondary | ICD-10-CM

## 2020-03-26 DIAGNOSIS — N183 Chronic kidney disease, stage 3 unspecified: Secondary | ICD-10-CM

## 2020-03-26 DIAGNOSIS — J449 Chronic obstructive pulmonary disease, unspecified: Secondary | ICD-10-CM

## 2020-03-26 DIAGNOSIS — I2510 Atherosclerotic heart disease of native coronary artery without angina pectoris: Secondary | ICD-10-CM

## 2020-03-26 NOTE — Patient Instructions (Signed)
Visit Information  Goals Addressed              This Visit's Progress     Patient Stated   .  PharmD "I can't afford my medications" (pt-stated)        CARE PLAN ENTRY (see longtitudinal plan of care for additional care plan information)  Current Barriers:  . Social, financial, or community barriers:  o Reports her biggest concern today is worrying about bleeding. She notes that the "smallest thing" will cause her to bleed, and she bleeds through bandages o Dull leg pain: no benefit w/ APAP, Voltaren, some benefit last night w/ aspercream w/ lidocaine. Pain stays the same whether walking or sitting. Back of both legs is where it hurts.  . Polypharmacy; complex patient with multiple comorbidities including HFrEF (EF 30-35%), CAD, COPD, DM . Most recent eGFR: >60 mL/min o HFrEF: Entresto 49/51 mg BID, carvedilol 25 mg BID, furosemide 20 mg daily; isosorbide 60 mg QAM  - Weighing daily: weight this morning 169 - BP: 100/62, HR 73; 113/68, 74; 120/60, 74; 138/69, 75 o Vtach: amiodarone 100 mg BID o ASCVD risk reduction: rosuvastatin 20 mg daily, last LDL at goal 70; ASA 81 mg daily, clopidogrel 75 mg daily (s/p carotid stent placement) o COPD: Symbicort 160/4.5 mcg 2 puffs BID, Spiriva 18 mg daily   - [place]ort through [place] through 08/30/20 - Did not qualify for Spiriva assistance, given income o O8IL: Trulicity 1.5 mg weekly, last A1c 5.7% - Receiving Trulicity through [employer] assistance through 08/30/20  Pharmacist Clinical Goal(s):  Marland Kitchen Over the next 90 days, patient will work with PharmD and provider towards optimized medication management  Interventions: . Comprehensive medication review performed; medication list updated in electronic medical record . Inter-disciplinary care team collaboration (see longitudinal plan of care) . Reviewed to continue DAPT until directed otherwise by Vascular. Reviewed purpose of DAPT to prevent stenosis and need for  re-stenting. She verbalized understanding . Praised for continued control of glucose. Continue Trulicity.  . Discussed low-normal BP. Encouraged to take BP machine and/or readings w/ her to Dr. Clayborn Bigness f/u in ~2 weeks. Discussed that he may decide to reduce doses of one of her medications. She verbalized understanding . Moving forward, could consider streamlining therapy by switching to triple therapy Breztri or Trelegy. This would reduce the #copays patient pays.  . Reviewed that if pain worsens or leg becomes hot to contact office for evaluation. She verbalized understanding. She plans to discuss w/ PCP at next appt.  Patient Self Care Activities:  . Patient will take medications as prescribed  Please see past updates related to this goal by clicking on the "Past Updates" button in the selected goal         The patient verbalized understanding of instructions provided today and declined a print copy of patient instruction materials.   Plan:  - Scheduled f/u call in ~ 8 weeks  [name], PharmD, [place] [phone]

## 2020-03-26 NOTE — Chronic Care Management (AMB) (Signed)
Chronic Care Management   Follow Up Note   03/26/2020 Name: Sarah White MRN: [id number] DOB: Apr 26, 1946  Referred by: Valerie Roys, DO Reason for referral : Chronic Care Management (Medication Management)   Sarah White is a 74 y.o. year old female who is a primary care patient of Valerie Roys, DO. The CCM team was consulted for assistance with chronic disease management and care coordination needs.    Contacted patient for medication management review.    Review of patient status, including review of consultants reports, relevant laboratory and other test results, and collaboration with appropriate care team members and the patient's provider was performed as part of comprehensive patient evaluation and provision of chronic care management services.    SDOH (Social Determinants of Health) assessments performed: Yes See Care Plan activities for detailed interventions related to SDOH)  SDOH Interventions     Most Recent Value  SDOH Interventions  Financial Strain Interventions Other (Comment)  [patient assistance application]       Outpatient Encounter Medications as of 03/26/2020  Medication Sig Note  . albuterol (VENTOLIN HFA) 108 (90 Base) MCG/ACT inhaler Inhale 2 puffs into the lungs every 6 (six) hours as needed for wheezing or shortness of breath. 03/26/2020: Taking 2 puffs BID  . amiodarone (PACERONE) 200 MG tablet Take 100 mg by mouth daily.    Marland Kitchen aspirin EC 81 MG tablet Take 81 mg by mouth at bedtime.    . budesonide-formoterol (SYMBICORT) 160-4.5 MCG/ACT inhaler Inhale 2 puffs into the lungs 2 (two) times daily.   . carvedilol (COREG) 12.5 MG tablet TAKE 2 TABLETS BY MOUTH TWICE DAILY WITH A MEAL   . clopidogrel (PLAVIX) 75 MG tablet Take 1 tablet (75 mg total) by mouth daily at 6 (six) AM.   . cyclobenzaprine (FLEXERIL) 10 MG tablet Take 1 tablet (10 mg total) by mouth 3 (three) times daily as needed for muscle spasms. DO NOT DRIVE ON THIS MEDICINE- WILL MAKE YOU  SLEEPY   . diphenhydrAMINE (BENADRYL) 25 MG tablet Take 25 mg by mouth every 6 (six) hours as needed for allergies.  03/26/2020: Taking 1 QPM  . Dulaglutide (TRULICITY) 1.5 ZC/5.8IF SOPN Inject 1.5 mg into the skin once a week. (Patient taking differently: Inject 1.5 mg into the skin once a week. Friday)   . furosemide (LASIX) 40 MG tablet Take 20 mg by mouth daily.    . isosorbide mononitrate (IMDUR) 60 MG 24 hr tablet Take 1 tablet (60 mg total) by mouth daily.   . rosuvastatin (CRESTOR) 20 MG tablet Take 1 tablet (20 mg total) by mouth daily.   . sacubitril-valsartan (ENTRESTO) 49-51 MG Take 1 tablet by mouth 2 (two) times daily.    . sertraline (ZOLOFT) 100 MG tablet Take 2 tablets by mouth once daily   . SPIRIVA HANDIHALER 18 MCG inhalation capsule PLACE 1 CAPSULE INTO INHALER AND INHALE DAILY   . traZODone (DESYREL) 50 MG tablet TAKE 1/2 TO 1 (ONE-HALF TO ONE) TABLET BY MOUTH AT BEDTIME AS NEEDED FOR SLEEP (Patient taking differently: Take 50 mg by mouth at bedtime. )   . Lidocaine 4 % PTCH Apply 1 patch topically every 12 (twelve) hours. (Patient taking differently: Apply 1 patch topically daily as needed (pain). )   . meclizine (ANTIVERT) 12.5 MG tablet Take 1 tablet (12.5 mg total) by mouth 3 (three) times daily as needed for dizziness. (Patient not taking: Reported on 03/26/2020)   . Multiple Vitamin (MULTIVITAMIN WITH MINERALS) TABS tablet  Take 1 tablet by mouth daily.   . nitroGLYCERIN (NITROSTAT) 0.4 MG SL tablet Place 1 tablet (0.4 mg total) under the tongue every 5 (five) minutes as needed for chest pain. (Patient not taking: Reported on 03/26/2020)   . ondansetron (ZOFRAN-ODT) 4 MG disintegrating tablet Take 4 mg by mouth every 8 (eight) hours as needed for nausea or vomiting. (Patient not taking: Reported on 03/26/2020)   . silver sulfADIAZINE (SILVADENE) 1 % cream Apply 1 application topically daily. (Patient taking differently: Apply 1 application topically daily as needed (Rash). )     . triamcinolone ointment (KENALOG) 0.5 % Apply 1 application topically 2 (two) times daily. (Patient not taking: Reported on 02/23/2020)    No facility-administered encounter medications on file as of 03/26/2020.     Objective:   Goals Addressed              This Visit's Progress     Patient Stated   .  PharmD "I can't afford my medications" (pt-stated)        CARE PLAN ENTRY (see longtitudinal plan of care for additional care plan information)  Current Barriers:  . Social, financial, or community barriers:  o Reports her biggest concern today is worrying about bleeding. She notes that the "smallest thing" will cause her to bleed, and she bleeds through bandages o Dull leg pain: no benefit w/ APAP, Voltaren, some benefit last night w/ aspercream w/ lidocaine. Pain stays the same whether walking or sitting. Back of both legs is where it hurts.  . Polypharmacy; complex patient with multiple comorbidities including HFrEF (EF 30-35%), CAD, COPD, DM . Most recent eGFR: >60 mL/min o HFrEF: Entresto 49/51 mg BID, carvedilol 25 mg BID, furosemide 20 mg daily; isosorbide 60 mg QAM  - Weighing daily: weight this morning 169 - BP: 100/62, HR 73; 113/68, 74; 120/60, 74; 138/69, 75 o Vtach: amiodarone 100 mg BID o ASCVD risk reduction: rosuvastatin 20 mg daily, last LDL at goal 70; ASA 81 mg daily, clopidogrel 75 mg daily (s/p carotid stent placement) o COPD: Symbicort 160/4.5 mcg 2 puffs BID, Spiriva 18 mg daily   - Receiving Symbicort through [place] through 08/30/20 - Did not qualify for Spiriva assistance, given income o P8KD: Trulicity 1.5 mg weekly, last A1c 9.8% - Receiving Trulicity through [employer] assistance through 08/30/20  Pharmacist Clinical Goal(s):  Marland Kitchen Over the next 90 days, patient will work with PharmD and provider towards optimized medication management  Interventions: . Comprehensive medication review performed; medication list updated in electronic medical  record . Inter-disciplinary care team collaboration (see longitudinal plan of care) . Reviewed to continue DAPT until directed otherwise by Vascular. Reviewed purpose of DAPT to prevent stenosis and need for re-stenting. She verbalized understanding . Praised for continued control of glucose. Continue Trulicity.  . Discussed low-normal BP. Encouraged to take BP machine and/or readings w/ her to Dr. Clayborn Bigness f/u in ~2 weeks. Discussed that he may decide to reduce doses of one of her medications. She verbalized understanding . Moving forward, could consider streamlining therapy by switching to triple therapy Breztri or Trelegy. This would reduce the #copays patient pays.  . Reviewed that if pain worsens or leg becomes hot to contact office for evaluation. She verbalized understanding. She plans to discuss w/ PCP at next appt.  Patient Self Care Activities:  . Patient will take medications as prescribed  Please see past updates related to this goal by clicking on the "Past Updates" button in the selected  goal          Plan:  - Scheduled f/u call in ~ 8 weeks  [name], PharmD, [place] [phone]

## 2020-03-26 NOTE — Telephone Encounter (Signed)
Patient was made aware with medical advice and verbalized understanding 

## 2020-03-26 NOTE — Telephone Encounter (Signed)
Looks like the patient underwent stenting on 03/06/20. She will need to be on aspirin and plavix for at least three months to protect the stent. Unless she is having bloody bowel movements or blood in her urine I do not recommend stopping either medication. If she is bleeding from a cut longer than usual - I recommend holding pressure. She may also bruise easier. Unfortunately, the benefits in regard to protecting her recent stent placement (having the stent go down / stroke) outweigh prolonged bleeding / bruising.

## 2020-04-03 ENCOUNTER — Telehealth: Payer: Self-pay | Admitting: Family Medicine

## 2020-04-03 NOTE — Telephone Encounter (Signed)
Copied from [place] [id number]. Topic: Medicare AWV >> Apr 03, 2020 11:43 AM Cher Nakai R wrote: Reason for CRM:   Left message for patient to call back and re-schedule the Medicare Annual Wellness Visit (AWV) virtually.  Last AWV 02/15/2019  Please schedule at anytime with [facility] Health Advisor.  45 minute appointment  Any questions, please call me at [phone]

## 2020-04-05 ENCOUNTER — Other Ambulatory Visit (INDEPENDENT_AMBULATORY_CARE_PROVIDER_SITE_OTHER): Payer: Self-pay | Admitting: Vascular Surgery

## 2020-04-05 DIAGNOSIS — I6521 Occlusion and stenosis of right carotid artery: Secondary | ICD-10-CM

## 2020-04-05 DIAGNOSIS — Z95828 Presence of other vascular implants and grafts: Secondary | ICD-10-CM

## 2020-04-08 ENCOUNTER — Ambulatory Visit (INDEPENDENT_AMBULATORY_CARE_PROVIDER_SITE_OTHER): Payer: Medicare Other | Admitting: Nurse Practitioner

## 2020-04-08 ENCOUNTER — Encounter (INDEPENDENT_AMBULATORY_CARE_PROVIDER_SITE_OTHER): Payer: Medicare Other

## 2020-04-08 ENCOUNTER — Encounter (INDEPENDENT_AMBULATORY_CARE_PROVIDER_SITE_OTHER): Payer: Self-pay

## 2020-04-09 ENCOUNTER — Other Ambulatory Visit: Payer: Self-pay | Admitting: Family Medicine

## 2020-04-09 NOTE — Telephone Encounter (Signed)
Requested Prescriptions  Pending Prescriptions Disp Refills  . rosuvastatin (CRESTOR) 20 MG tablet [Pharmacy Med Name: Rosuvastatin Calcium 20 MG Oral Tablet] 90 tablet 0    Sig: Take 1 tablet by mouth once daily     Cardiovascular:  Antilipid - Statins Failed - 04/09/2020  5:30 AM      Failed - LDL in normal range and within 360 days    Ldl Cholesterol, Calc  Date Value Ref Range Status  01/16/2014 156 (H) 0 - 100 mg/dL Final   LDL Chol Calc (NIH)  Date Value Ref Range Status  12/18/2019 69 0 - 99 mg/dL Final         Passed - Total Cholesterol in normal range and within 360 days    Cholesterol, Total  Date Value Ref Range Status  12/18/2019 135 100 - 199 mg/dL Final   Cholesterol  Date Value Ref Range Status  01/16/2014 258 (H) 0 - 200 mg/dL Final   Cholesterol Piccolo, Waived  Date Value Ref Range Status  02/04/2016 164 <200 mg/dL Final    Comment:                            Desirable                <200                         Borderline High      200- 239                         High                     >239          Passed - HDL in normal range and within 360 days    HDL Cholesterol  Date Value Ref Range Status  01/16/2014 35 (L) 40 - 60 mg/dL Final   HDL  Date Value Ref Range Status  12/18/2019 42 >39 mg/dL Final         Passed - Triglycerides in normal range and within 360 days    Triglycerides  Date Value Ref Range Status  12/18/2019 138 0 - 149 mg/dL Final  01/16/2014 337 (H) 0 - 200 mg/dL Final   Triglycerides Piccolo,Waived  Date Value Ref Range Status  02/04/2016 258 (H) <150 mg/dL Final    Comment:                            Normal                   <150                         Borderline High     150 - 199                         High                200 - 499                         Very High                >499  Passed - Patient is not pregnant      Passed - Valid encounter within last 12 months    Recent Outpatient Visits           1 month ago Skin lesion   [place], [name], DO   2 months ago Skin lesion   [facility] [place], [name], DO   2 months ago Type 2 diabetes mellitus with stage 3 chronic kidney disease, without long-term current use of insulin, unspecified whether stage 3a or 3b CKD ([place])   [place], [name], DO   3 months ago COPD exacerbation ([place])   [place], [place], DO   5 months ago [place], oral   [facility], [place], [place]      Future Appointments            In 1 week Wynetta Emery, [name], DO [employer], [facility]   In 1 week  [employer]           . isosorbide mononitrate (IMDUR) 60 MG 24 hr tablet [Pharmacy Med Name: Isosorbide Mononitrate ER 60 MG Oral Tablet Extended Release 24 Hour] 90 tablet 0    Sig: Take 1 tablet by mouth once daily     Cardiovascular:  Nitrates Failed - 04/09/2020  5:30 AM      Failed - Last BP in normal range    BP Readings from Last 1 Encounters:  03/08/20 (!) 115/46         Passed - Last Heart Rate in normal range    Pulse Readings from Last 1 Encounters:  03/08/20 68         Passed - Valid encounter within last 12 months    Recent Outpatient Visits          1 month ago Skin lesion   [facility] [place], [name], DO   2 months ago Skin lesion   [facility] [place], [name], DO   2 months ago Type 2 diabetes mellitus with stage 3 chronic kidney disease, without long-term current use of insulin, unspecified whether stage 3a or 3b CKD ([place])   [place], [name], DO   3 months ago COPD exacerbation [facility] [place])   [place], [place], DO   5 months ago [place], oral   [facility], [place], [place]      Future Appointments            In 1 week Wynetta Emery, Barb Merino, DO [employer], [place]   In 1 week  [facility], [facility]

## 2020-04-10 DIAGNOSIS — J9621 Acute and chronic respiratory failure with hypoxia: Secondary | ICD-10-CM | POA: Diagnosis not present

## 2020-04-10 DIAGNOSIS — I252 Old myocardial infarction: Secondary | ICD-10-CM | POA: Diagnosis not present

## 2020-04-10 DIAGNOSIS — E1122 Type 2 diabetes mellitus with diabetic chronic kidney disease: Secondary | ICD-10-CM | POA: Diagnosis not present

## 2020-04-10 DIAGNOSIS — R42 Dizziness and giddiness: Secondary | ICD-10-CM | POA: Diagnosis not present

## 2020-04-10 DIAGNOSIS — I952 Hypotension due to drugs: Secondary | ICD-10-CM | POA: Diagnosis not present

## 2020-04-10 DIAGNOSIS — I5022 Chronic systolic (congestive) heart failure: Secondary | ICD-10-CM | POA: Diagnosis not present

## 2020-04-10 DIAGNOSIS — I2510 Atherosclerotic heart disease of native coronary artery without angina pectoris: Secondary | ICD-10-CM | POA: Diagnosis not present

## 2020-04-10 DIAGNOSIS — I6521 Occlusion and stenosis of right carotid artery: Secondary | ICD-10-CM | POA: Diagnosis not present

## 2020-04-10 DIAGNOSIS — E782 Mixed hyperlipidemia: Secondary | ICD-10-CM | POA: Diagnosis not present

## 2020-04-10 DIAGNOSIS — J449 Chronic obstructive pulmonary disease, unspecified: Secondary | ICD-10-CM | POA: Diagnosis not present

## 2020-04-10 DIAGNOSIS — E876 Hypokalemia: Secondary | ICD-10-CM | POA: Diagnosis not present

## 2020-04-10 DIAGNOSIS — I472 Ventricular tachycardia: Secondary | ICD-10-CM | POA: Diagnosis not present

## 2020-04-16 ENCOUNTER — Encounter: Payer: Self-pay | Admitting: Family Medicine

## 2020-04-17 ENCOUNTER — Ambulatory Visit (INDEPENDENT_AMBULATORY_CARE_PROVIDER_SITE_OTHER): Payer: Medicare Other | Admitting: Nurse Practitioner

## 2020-04-17 ENCOUNTER — Ambulatory Visit (INDEPENDENT_AMBULATORY_CARE_PROVIDER_SITE_OTHER): Payer: Medicare Other

## 2020-04-17 ENCOUNTER — Other Ambulatory Visit: Payer: Self-pay

## 2020-04-17 ENCOUNTER — Encounter (INDEPENDENT_AMBULATORY_CARE_PROVIDER_SITE_OTHER): Payer: Self-pay | Admitting: Nurse Practitioner

## 2020-04-17 VITALS — BP 98/57 | HR 73 | Resp 16 | Wt 171.0 lb

## 2020-04-17 DIAGNOSIS — Z9889 Other specified postprocedural states: Secondary | ICD-10-CM

## 2020-04-17 DIAGNOSIS — Z95828 Presence of other vascular implants and grafts: Secondary | ICD-10-CM | POA: Diagnosis not present

## 2020-04-17 DIAGNOSIS — E782 Mixed hyperlipidemia: Secondary | ICD-10-CM

## 2020-04-17 DIAGNOSIS — I6521 Occlusion and stenosis of right carotid artery: Secondary | ICD-10-CM | POA: Diagnosis not present

## 2020-04-17 DIAGNOSIS — I6523 Occlusion and stenosis of bilateral carotid arteries: Secondary | ICD-10-CM

## 2020-04-17 DIAGNOSIS — F17200 Nicotine dependence, unspecified, uncomplicated: Secondary | ICD-10-CM

## 2020-04-17 DIAGNOSIS — I10 Essential (primary) hypertension: Secondary | ICD-10-CM

## 2020-04-17 NOTE — Progress Notes (Signed)
Subjective:    [name], female    DOB: Jul 04, 1946, 74 y.o.   MRN: [id number] Chief Complaint  Patient presents with  . Follow-up    [facility] 1wk carotid pta/stent    The patient is seen for follow up evaluation of carotid stenosis status post right carotid stent on 03/06/2020.  There were no post operative problems or complications related to the surgery.  The patient denies neck or incisional pain.  The patient denies interval amaurosis fugax. There is no recent history of TIA symptoms or focal motor deficits. There is no prior documented CVA.  The patient denies headache.  The patient is taking enteric-coated aspirin 81 mg daily.  The patient has a history of coronary artery disease, no recent episodes of angina or shortness of breath. The patient denies PAD or claudication symptoms. There is a history of hyperlipidemia which is being treated with a statin.   Carotid duplex shows 1 to 39% stenosis of the right internal carotid artery with 40 to 59% stenosis of the left ICA.   Review of Systems  Eyes: Negative for visual disturbance.  Musculoskeletal: Negative for neck pain.  Skin: Negative for wound.  Hematological: Bruises/bleeds easily.  All other systems reviewed and are negative.      Objective:   Physical Exam Vitals reviewed.  HENT:     Head: Normocephalic.  Neck:     Vascular: Carotid bruit present.  Cardiovascular:     Rate and Rhythm: Normal rate and regular rhythm.     Pulses: Normal pulses.     Heart sounds: Normal heart sounds.  Pulmonary:     Effort: Pulmonary effort is normal.  Skin:    General: Skin is warm and dry.  Neurological:     Mental Status: She is alert and oriented to person, place, and time.     Motor: Weakness present.     Gait: Gait abnormal.  Psychiatric:        Mood and Affect: Mood normal.        Behavior: Behavior normal.        Thought Content: Thought content normal.        Judgment: Judgment normal.      BP (!) 98/57 (BP Location: Right Arm)   Pulse 73   Resp 16   Wt 171 lb (77.6 kg)   BMI 31.28 kg/m   Past Medical History:  Diagnosis Date  . Acute pancreatitis 09/09/2018  . Acute respiratory failure with hypoxia and hypercarbia ([place]) 07/22/2015  . Cancer ([place])    uterine  . CHF (congestive heart failure) ([place])   . COPD (chronic obstructive pulmonary disease) ([place])   . Diabetes mellitus without complication ([place])   . Encephalopathy acute 07/22/2015  . Hypertension   . Pneumonia    November 2016  . Pulmonary edema 07/22/2015  . Squamous cell cancer of skin of upper arm, right     Social History   Socioeconomic History  . Marital status: Divorced    Spouse name: Not on file  . Number of children: Not on file  . Years of education: Not on file  . Highest education level: Some college, no degree  Occupational History  . Occupation: retired  Tobacco Use  . Smoking status: Current Every Day Smoker    Packs/day: 0.50    Types: Cigarettes  . Smokeless tobacco: Never Used  Vaping Use  . Vaping Use: Never used  Substance and Sexual Activity  . Alcohol use: No  .  Drug use: No  . Sexual activity: Never  Other Topics Concern  . Not on file  Social History Narrative  . Not on file   Social Determinants of Health   Financial Resource Strain: Medium Risk  . Difficulty of Paying Living Expenses: Somewhat hard  Food Insecurity: No Food Insecurity  . Worried About Charity fundraiser in the Last Year: Never true  . Ran Out of Food in the Last Year: Never true  Transportation Needs: No Transportation Needs  . Lack of Transportation (Medical): No  . Lack of Transportation (Non-Medical): No  Physical Activity: Inactive  . Days of Exercise per Week: 0 days  . Minutes of Exercise per Session: 0 min  Stress: No Stress Concern Present  . Feeling of Stress : Only a little  Social Connections: Socially Isolated  . Frequency of Communication with Friends and Family: More  than three times a week  . Frequency of Social Gatherings with Friends and Family: More than three times a week  . Attends Religious Services: Never  . Active Member of Clubs or Organizations: No  . Attends Archivist Meetings: Never  . Marital Status: Widowed  Intimate Partner Violence: Not At Risk  . Fear of Current or Ex-Partner: No  . Emotionally Abused: No  . Physically Abused: No  . Sexually Abused: No    Past Surgical History:  Procedure Laterality Date  . ABDOMINAL HYSTERECTOMY    . CAROTID PTA/STENT INTERVENTION Right 03/06/2020   Procedure: CAROTID PTA/STENT INTERVENTION;  Surgeon: Katha Cabal, MD;  Location: [facility];  Service: Cardiovascular;  Laterality: Right;  . CARPAL TUNNEL RELEASE Bilateral   . CESAREAN SECTION    . CHOLECYSTECTOMY N/A 09/12/2018   Procedure: LAPAROSCOPIC CHOLECYSTECTOMY WITH INTRAOPERATIVE CHOLANGIOGRAM;  Surgeon: Herbert Pun, MD;  Location: [facility];  Service: General;  Laterality: N/A;  . CORONARY ANGIOPLASTY WITH STENT PLACEMENT    . KNEE SURGERY Left   . LEFT HEART CATH AND CORONARY ANGIOGRAPHY Right 11/26/2016   Procedure: Left Heart Cath and Coronary Angiography;  Surgeon: Isaias Cowman, MD;  Location: [facility];  Service: Cardiovascular;  Laterality: Right;  . LEFT HEART CATH AND CORONARY ANGIOGRAPHY N/A 01/23/2020   Procedure: LEFT HEART CATH AND CORONARY ANGIOGRAPHY;  Surgeon: Yolonda Kida, MD;  Location: [facility];  Service: Cardiovascular;  Laterality: N/A;    Family History  Problem Relation Age of Onset  . Heart disease Mother     Allergies  Allergen Reactions  . Amlodipine Swelling  . Atorvastatin Other (See Comments)    myalgia  . Codeine Nausea And Vomiting  . Hctz [Hydrochlorothiazide] Other (See Comments)    Hypokalemia  . Lisinopril Other (See Comments)    Dizziness   . Metformin And Related Nausea Only    Nausea, dizziness       Assessment &  Plan:   1. Symptomatic stenosis of both carotid arteries without infarction Recommend:  The patient is s/p successful right carotid stent  Duplex ultrasound preoperatively shows 40-59% contralateral stenosis.  Continue antiplatelet therapy as prescribed Continue management of CAD, HTN and Hyperlipidemia Healthy heart diet,  encouraged exercise at least 4 times per week  Follow up in 3 months with duplex ultrasound and physical exam based on the patient's carotid surgery and 50% stenosis of the left carotid artery    2. Smoker Smoking cessation was discussed, 3-10 minutes spent on this topic specifically   3. Essential (primary) hypertension Continue antihypertensive  medications as already ordered, these medications have been reviewed and there are no changes at this time.   4. Mixed hyperlipidemia Continue statin as ordered and reviewed, no changes at this time    Current Outpatient Medications on File Prior to Visit  Medication Sig Dispense Refill  . albuterol (VENTOLIN HFA) 108 (90 Base) MCG/ACT inhaler Inhale 2 puffs into the lungs every 6 (six) hours as needed for wheezing or shortness of breath. 18 g 6  . amiodarone (PACERONE) 200 MG tablet Take 100 mg by mouth daily.     Marland Kitchen aspirin EC 81 MG tablet Take 81 mg by mouth at bedtime.     . budesonide-formoterol (SYMBICORT) 160-4.5 MCG/ACT inhaler Inhale 2 puffs into the lungs 2 (two) times daily. 3 Inhaler 3  . carvedilol (COREG) 12.5 MG tablet TAKE 2 TABLETS BY MOUTH TWICE DAILY WITH A MEAL 360 tablet 0  . clopidogrel (PLAVIX) 75 MG tablet Take 1 tablet (75 mg total) by mouth daily at 6 (six) AM. 90 tablet 3  . cyclobenzaprine (FLEXERIL) 10 MG tablet Take 1 tablet (10 mg total) by mouth 3 (three) times daily as needed for muscle spasms. DO NOT DRIVE ON THIS MEDICINE- WILL MAKE YOU SLEEPY 30 tablet 0  . diphenhydrAMINE (BENADRYL) 25 MG tablet Take 25 mg by mouth every 6 (six) hours as needed for allergies.     . Dulaglutide  (TRULICITY) 1.5 NL/8.9QJ SOPN Inject 1.5 mg into the skin once a week. (Patient taking differently: Inject 1.5 mg into the skin once a week. Friday) 4 pen 6  . furosemide (LASIX) 40 MG tablet Take 20 mg by mouth daily.     . isosorbide mononitrate (IMDUR) 60 MG 24 hr tablet Take 1 tablet by mouth once daily 90 tablet 1  . Lidocaine 4 % PTCH Apply 1 patch topically every 12 (twelve) hours. (Patient taking differently: Apply 1 patch topically daily as needed (pain). ) 60 patch 3  . Multiple Vitamin (MULTIVITAMIN WITH MINERALS) TABS tablet Take 1 tablet by mouth daily.    . rosuvastatin (CRESTOR) 20 MG tablet Take 1 tablet by mouth once daily 90 tablet 2  . sacubitril-valsartan (ENTRESTO) 49-51 MG Take 1 tablet by mouth 2 (two) times daily.     . sertraline (ZOLOFT) 100 MG tablet Take 2 tablets by mouth once daily 180 tablet 0  . silver sulfADIAZINE (SILVADENE) 1 % cream Apply 1 application topically daily. (Patient taking differently: Apply 1 application topically daily as needed (Rash). ) 50 g 0  . SPIRIVA HANDIHALER 18 MCG inhalation capsule PLACE 1 CAPSULE INTO INHALER AND INHALE DAILY 90 capsule 0  . traZODone (DESYREL) 50 MG tablet TAKE 1/2 TO 1 (ONE-HALF TO ONE) TABLET BY MOUTH AT BEDTIME AS NEEDED FOR SLEEP (Patient taking differently: Take 50 mg by mouth at bedtime. ) 90 tablet 1  . meclizine (ANTIVERT) 12.5 MG tablet Take 1 tablet (12.5 mg total) by mouth 3 (three) times daily as needed for dizziness. (Patient not taking: Reported on 03/26/2020) 30 tablet 3  . nitroGLYCERIN (NITROSTAT) 0.4 MG SL tablet Place 1 tablet (0.4 mg total) under the tongue every 5 (five) minutes as needed for chest pain. (Patient not taking: Reported on 03/26/2020) 30 tablet 2  . ondansetron (ZOFRAN-ODT) 4 MG disintegrating tablet Take 4 mg by mouth every 8 (eight) hours as needed for nausea or vomiting. (Patient not taking: Reported on 03/26/2020)    . triamcinolone ointment (KENALOG) 0.5 % Apply 1 application topically 2  (two) times  daily. (Patient not taking: Reported on 02/23/2020) 30 g 3   No current facility-administered medications on file prior to visit.    There are no Patient Instructions on file for this visit. No follow-ups on file.   Kris Hartmann, NP

## 2020-04-18 ENCOUNTER — Ambulatory Visit: Payer: Medicare Other

## 2020-04-18 ENCOUNTER — Ambulatory Visit (INDEPENDENT_AMBULATORY_CARE_PROVIDER_SITE_OTHER): Payer: Medicare Other | Admitting: Family Medicine

## 2020-04-18 ENCOUNTER — Encounter: Payer: Self-pay | Admitting: Family Medicine

## 2020-04-18 VITALS — BP 99/61 | HR 75 | Temp 98.7°F | Wt 170.0 lb

## 2020-04-18 DIAGNOSIS — I129 Hypertensive chronic kidney disease with stage 1 through stage 4 chronic kidney disease, or unspecified chronic kidney disease: Secondary | ICD-10-CM | POA: Diagnosis not present

## 2020-04-18 DIAGNOSIS — I208 Other forms of angina pectoris: Secondary | ICD-10-CM

## 2020-04-18 DIAGNOSIS — I10 Essential (primary) hypertension: Secondary | ICD-10-CM

## 2020-04-18 DIAGNOSIS — E782 Mixed hyperlipidemia: Secondary | ICD-10-CM

## 2020-04-18 DIAGNOSIS — E1165 Type 2 diabetes mellitus with hyperglycemia: Secondary | ICD-10-CM | POA: Diagnosis not present

## 2020-04-18 MED ORDER — ISOSORBIDE MONONITRATE ER 30 MG PO TB24: 30.0000 mg | ORAL_TABLET | Freq: Every day | ORAL | 1 refills | Status: DC

## 2020-04-18 MED ORDER — BUPROPION HCL ER (SR) 150 MG PO TB12
150.0000 mg | ORAL_TABLET | Freq: Two times a day (BID) | ORAL | 3 refills | Status: DC
Start: 2020-04-18 — End: 2020-07-27

## 2020-04-18 NOTE — Progress Notes (Signed)
BP 99/61 (BP Location: Left Arm, Patient Position: Sitting, Cuff Size: Normal)   Pulse 75   Temp 98.7 F (37.1 C) (Oral)   Wt 170 lb (77.1 kg)   SpO2 95%   BMI 31.09 kg/m    Subjective:    [name], female    DOB: Jan 16, 1946, 74 y.o.   MRN: [id number]  HPI: Sarah White is a 74 y.o. female  Chief Complaint  Patient presents with  . Diabetes   HYPERTENSION / HYPERLIPIDEMIA Satisfied with current treatment? yes Duration of hypertension: chronic BP monitoring frequency: not checking BP medication side effects: no Duration of hyperlipidemia: chronic Cholesterol medication side effects: no Cholesterol supplements: none Past cholesterol medications: crestor Medication compliance: excellent compliance Aspirin: yes Recent stressors: no Recurrent headaches: no Visual changes: no Palpitations: no Dyspnea: no Chest pain: no Lower extremity edema: no Dizzy/lightheaded: yes  DIABETES Hypoglycemic episodes:no Polydipsia/polyuria: no Visual disturbance: no Chest pain: no Paresthesias: no Glucose Monitoring: yes  Accucheck frequency: occasionally Taking Insulin?: no Blood Pressure Monitoring: not checking Retinal Examination: Up to Date Foot Exam: Up to Date Diabetic Education: Completed Pneumovax: Up to Date Influenza: Up to Date Aspirin: yes  DEPRESSION Mood status: controlled Satisfied with current treatment?: yes Symptom severity: mild  Duration of current treatment : chronic Side effects: no Medication compliance: excellent compliance Psychotherapy/counseling: no  Previous psychiatric medications: sertraline and wellbutrin Depressed mood: no Anxious mood: no Anhedonia: no Significant weight loss or gain: no Insomnia: no  Fatigue: yes Feelings of worthlessness or guilt: no Impaired concentration/indecisiveness: no Suicidal ideations: no Hopelessness: no Crying spells: no Depression screen [facility] 2/9 01/19/2020 12/11/2019 09/20/2019  08/21/2019 02/15/2019  Decreased Interest 0 0 0 0 0  Down, Depressed, Hopeless 0 0 0 1 0  PHQ - 2 Score 0 0 0 1 0  Altered sleeping 0 - 3 0 -  Tired, decreased energy 0 - 1 1 -  Change in appetite 0 - 0 0 -  Feeling bad or failure about yourself  0 - 0 1 -  Trouble concentrating 0 - 0 3 -  Moving slowly or fidgety/restless 0 - 0 0 -  Suicidal thoughts 0 - 0 0 -  PHQ-9 Score 0 - 4 6 -  Difficult doing work/chores Not difficult at all - Not difficult at all Somewhat difficult -  Some recent data might be hidden     Relevant past medical, surgical, family and social history reviewed and updated as indicated. Interim medical history since our last visit reviewed. Allergies and medications reviewed and updated.  Review of Systems  Constitutional: Negative.   Respiratory: Negative.   Cardiovascular: Negative.   Gastrointestinal: Negative.   Musculoskeletal: Negative.   Neurological: Negative.   Psychiatric/Behavioral: Negative.     Per HPI unless specifically indicated above     Objective:    BP 99/61 (BP Location: Left Arm, Patient Position: Sitting, Cuff Size: Normal)   Pulse 75   Temp 98.7 F (37.1 C) (Oral)   Wt 170 lb (77.1 kg)   SpO2 95%   BMI 31.09 kg/m   Wt Readings from Last 3 Encounters:  04/18/20 170 lb (77.1 kg)  04/17/20 171 lb (77.6 kg)  02/22/20 176 lb (79.8 kg)    Physical Exam Vitals and nursing note reviewed.  Constitutional:      General: She is not in acute distress.    Appearance: Normal appearance. She is not ill-appearing, toxic-appearing or diaphoretic.  HENT:     Head:  Normocephalic and atraumatic.     Right Ear: External ear normal.     Left Ear: External ear normal.     Nose: Nose normal.     Mouth/Throat:     Mouth: Mucous membranes are moist.     Pharynx: Oropharynx is clear.  Eyes:     General: No scleral icterus.       Right eye: No discharge.        Left eye: No discharge.     Extraocular Movements: Extraocular movements  intact.     Conjunctiva/sclera: Conjunctivae normal.     Pupils: Pupils are equal, round, and reactive to light.  Cardiovascular:     Rate and Rhythm: Normal rate and regular rhythm.     Pulses: Normal pulses.     Heart sounds: Normal heart sounds. No murmur heard.  No friction rub. No gallop.   Pulmonary:     Effort: Pulmonary effort is normal. No respiratory distress.     Breath sounds: Normal breath sounds. No stridor. No wheezing, rhonchi or rales.  Chest:     Chest wall: No tenderness.  Musculoskeletal:        General: Normal range of motion.     Cervical back: Normal range of motion and neck supple.  Skin:    General: Skin is warm and dry.     Capillary Refill: Capillary refill takes less than 2 seconds.     Coloration: Skin is not jaundiced or pale.     Findings: No bruising, erythema, lesion or rash.  Neurological:     General: No focal deficit present.     Mental Status: She is alert and oriented to person, place, and time. Mental status is at baseline.  Psychiatric:        Mood and Affect: Mood normal.        Behavior: Behavior normal.        Thought Content: Thought content normal.        Judgment: Judgment normal.     Results for orders placed or performed in visit on 04/18/20  Bayer DCA Hb A1c Waived  Result Value Ref Range   HB A1C (BAYER DCA - WAIVED) 5.6 <7.0 %  Comprehensive metabolic panel  Result Value Ref Range   Glucose 95 65 - 99 mg/dL   BUN 14 8 - 27 mg/dL   Creatinine, Ser 0.96 0.57 - 1.00 mg/dL   GFR calc non Af Amer 60 >59 mL/min/1.73   GFR calc Af Amer 69 >59 mL/min/1.73   BUN/Creatinine Ratio 15 12 - 28   Sodium 138 134 - 144 mmol/L   Potassium 4.3 3.5 - 5.2 mmol/L   Chloride 102 96 - 106 mmol/L   CO2 26 20 - 29 mmol/L   Calcium 8.6 (L) 8.7 - 10.3 mg/dL   Total Protein 6.3 6.0 - 8.5 g/dL   Albumin 4.3 3.7 - 4.7 g/dL   Globulin, Total 2.0 1.5 - 4.5 g/dL   Albumin/Globulin Ratio 2.2 1.2 - 2.2   Bilirubin Total 0.3 0.0 - 1.2 mg/dL    Alkaline Phosphatase 90 48 - 121 IU/L   AST 51 (H) 0 - 40 IU/L   ALT 64 (H) 0 - 32 IU/L  Lipid Panel w/o Chol/HDL Ratio  Result Value Ref Range   Cholesterol, Total 142 100 - 199 mg/dL   Triglycerides 152 (H) 0 - 149 mg/dL   HDL 41 >39 mg/dL   VLDL Cholesterol Cal 26 5 - 40 mg/dL   LDL Chol  Calc (NIH) 75 0 - 99 mg/dL      Assessment & Plan:   Problem List Items Addressed This Visit      Cardiovascular and Mediastinum   Essential (primary) hypertension    BP running low. Continue to monitor closely, may need to cut medication if able, but on needed medication for her CHF. Continue to monitor.       Relevant Medications   isosorbide mononitrate (IMDUR) 30 MG 24 hr tablet     Endocrine   Type 2 diabetes mellitus with hyperglycemia (HCC)    Under good control with a1c of 5.6 on current regimen. Continue current regimen. Continue to monitor. Call with any concerns. Refills given.        Relevant Orders   Bayer DCA Hb A1c Waived (Completed)   Comprehensive metabolic panel (Completed)     Genitourinary   Hypertensive renal disease    BP running low. Continue to monitor closely, may need to cut medication if able, but on needed medication for her CHF. Continue to monitor.       Relevant Orders   Comprehensive metabolic panel (Completed)     Other   Hyperlipidemia - Primary    Under good control on current regimen. Continue current regimen. Continue to monitor. Call with any concerns. Refills given. Labs drawn today.       Relevant Medications   isosorbide mononitrate (IMDUR) 30 MG 24 hr tablet   Other Relevant Orders   Comprehensive metabolic panel (Completed)   Lipid Panel w/o Chol/HDL Ratio (Completed)       Follow up plan: Return in about 4 weeks (around 05/16/2020).

## 2020-04-19 ENCOUNTER — Ambulatory Visit: Payer: Medicare Other | Admitting: Family Medicine

## 2020-04-19 ENCOUNTER — Telehealth: Payer: Self-pay | Admitting: Licensed Clinical Social Worker

## 2020-04-19 ENCOUNTER — Telehealth: Payer: Self-pay

## 2020-04-19 LAB — COMPREHENSIVE METABOLIC PANEL
ALT: 64 IU/L — ABNORMAL HIGH (ref 0–32)
AST: 51 IU/L — ABNORMAL HIGH (ref 0–40)
Albumin/Globulin Ratio: 2.2 (ref 1.2–2.2)
Albumin: 4.3 g/dL (ref 3.7–4.7)
Alkaline Phosphatase: 90 IU/L (ref 48–121)
BUN/Creatinine Ratio: 15 (ref 12–28)
BUN: 14 mg/dL (ref 8–27)
Bilirubin Total: 0.3 mg/dL (ref 0.0–1.2)
CO2: 26 mmol/L (ref 20–29)
Calcium: 8.6 mg/dL — ABNORMAL LOW (ref 8.7–10.3)
Chloride: 102 mmol/L (ref 96–106)
Creatinine, Ser: 0.96 mg/dL (ref 0.57–1.00)
GFR calc Af Amer: 69 mL/min/{1.73_m2} (ref >59)
GFR calc non Af Amer: 60 mL/min/{1.73_m2} (ref >59)
Globulin, Total: 2 g/dL (ref 1.5–4.5)
Glucose: 95 mg/dL (ref 65–99)
Potassium: 4.3 mmol/L (ref 3.5–5.2)
Sodium: 138 mmol/L (ref 134–144)
Total Protein: 6.3 g/dL (ref 6.0–8.5)

## 2020-04-19 LAB — LIPID PANEL W/O CHOL/HDL RATIO
Cholesterol, Total: 142 mg/dL (ref 100–199)
HDL: 41 mg/dL (ref >39)
LDL Chol Calc (NIH): 75 mg/dL (ref 0–99)
Triglycerides: 152 mg/dL — ABNORMAL HIGH (ref 0–149)
VLDL Cholesterol Cal: 26 mg/dL (ref 5–40)

## 2020-04-19 LAB — BAYER DCA HB A1C WAIVED: HB A1C (BAYER DCA - WAIVED): 5.6 % (ref <7.0)

## 2020-04-19 NOTE — Telephone Encounter (Signed)
Chronic Care Management    Clinical Social Work General Follow Up Note  04/19/2020 Name: Sarah White MRN: [id number] DOB: 03/31/1946  Sarah White is a 74 y.o. year old female who is a primary care patient of Valerie Roys, DO. The CCM team was consulted for assistance with Mental Health Counseling and Resources.   Review of patient status, including review of consultants reports, relevant laboratory and other test results, and collaboration with appropriate care team members and the patient's provider was performed as part of comprehensive patient evaluation and provision of chronic care management services.    LCSW completed CCM outreach attempt today but was unable to reach patient successfully. A HIPPA compliant voice message was unable to be left. LCSW will reschedule CCM SW appointment through [facility].   Outpatient Encounter Medications as of 04/19/2020  Medication Sig Note  . albuterol (VENTOLIN HFA) 108 (90 Base) MCG/ACT inhaler Inhale 2 puffs into the lungs every 6 (six) hours as needed for wheezing or shortness of breath. (Patient not taking: Reported on 04/18/2020) 03/26/2020: Taking 2 puffs BID  . amiodarone (PACERONE) 200 MG tablet Take 100 mg by mouth daily.    Marland Kitchen aspirin EC 81 MG tablet Take 81 mg by mouth at bedtime.    . budesonide-formoterol (SYMBICORT) 160-4.5 MCG/ACT inhaler Inhale 2 puffs into the lungs 2 (two) times daily.   Marland Kitchen buPROPion (WELLBUTRIN SR) 150 MG 12 hr tablet Take 1 tablet (150 mg total) by mouth 2 (two) times daily.   . carvedilol (COREG) 12.5 MG tablet TAKE 2 TABLETS BY MOUTH TWICE DAILY WITH A MEAL   . clopidogrel (PLAVIX) 75 MG tablet Take 1 tablet (75 mg total) by mouth daily at 6 (six) AM.   . cyclobenzaprine (FLEXERIL) 10 MG tablet Take 1 tablet (10 mg total) by mouth 3 (three) times daily as needed for muscle spasms. DO NOT DRIVE ON THIS MEDICINE- WILL MAKE YOU SLEEPY   . diphenhydrAMINE (BENADRYL) 25 MG tablet Take 25 mg by mouth every 6  (six) hours as needed for allergies.  03/26/2020: Taking 1 QPM  . Dulaglutide (TRULICITY) 1.5 DU/2.0UR SOPN Inject 1.5 mg into the skin once a week. (Patient taking differently: Inject 1.5 mg into the skin once a week. Friday)   . furosemide (LASIX) 40 MG tablet Take 20 mg by mouth daily.    . isosorbide mononitrate (IMDUR) 30 MG 24 hr tablet Take 1 tablet (30 mg total) by mouth daily.   . Lidocaine 4 % PTCH Apply 1 patch topically every 12 (twelve) hours. (Patient taking differently: Apply 1 patch topically daily as needed (pain). )   . meclizine (ANTIVERT) 12.5 MG tablet Take 1 tablet (12.5 mg total) by mouth 3 (three) times daily as needed for dizziness.   . Multiple Vitamin (MULTIVITAMIN WITH MINERALS) TABS tablet Take 1 tablet by mouth daily.   . nitroGLYCERIN (NITROSTAT) 0.4 MG SL tablet Place 1 tablet (0.4 mg total) under the tongue every 5 (five) minutes as needed for chest pain. (Patient not taking: Reported on 03/26/2020)   . ondansetron (ZOFRAN-ODT) 4 MG disintegrating tablet Take 4 mg by mouth every 8 (eight) hours as needed for nausea or vomiting. (Patient not taking: Reported on 03/26/2020)   . rosuvastatin (CRESTOR) 20 MG tablet Take 1 tablet by mouth once daily   . sacubitril-valsartan (ENTRESTO) 49-51 MG Take 1 tablet by mouth 2 (two) times daily.    . sertraline (ZOLOFT) 100 MG tablet Take 2 tablets by mouth once daily   .  silver sulfADIAZINE (SILVADENE) 1 % cream Apply 1 application topically daily. (Patient not taking: Reported on 04/18/2020)   . SPIRIVA HANDIHALER 18 MCG inhalation capsule PLACE 1 CAPSULE INTO INHALER AND INHALE DAILY   . traZODone (DESYREL) 50 MG tablet TAKE 1/2 TO 1 (ONE-HALF TO ONE) TABLET BY MOUTH AT BEDTIME AS NEEDED FOR SLEEP (Patient taking differently: Take 50 mg by mouth at bedtime. )   . triamcinolone ointment (KENALOG) 0.5 % Apply 1 application topically 2 (two) times daily.    No facility-administered encounter medications on file as of 04/19/2020.    Follow Up Plan: [facility] will reach out to patient to reschedule appointment.   Eula Fried, BSW, MSW, [facility] Care Management [email or web address] Phone: [phone]

## 2020-04-20 ENCOUNTER — Encounter: Payer: Self-pay | Admitting: Family Medicine

## 2020-04-21 NOTE — Assessment & Plan Note (Signed)
BP running low. Continue to monitor closely, may need to cut medication if able, but on needed medication for her CHF. Continue to monitor.

## 2020-04-21 NOTE — Assessment & Plan Note (Signed)
Under good control with a1c of 5.6 on current regimen. Continue current regimen. Continue to monitor. Call with any concerns. Refills given.

## 2020-04-21 NOTE — Assessment & Plan Note (Signed)
Under good control on current regimen. Continue current regimen. Continue to monitor. Call with any concerns. Refills given. Labs drawn today.   

## 2020-04-22 ENCOUNTER — Ambulatory Visit (INDEPENDENT_AMBULATORY_CARE_PROVIDER_SITE_OTHER): Payer: Medicare Other

## 2020-04-22 ENCOUNTER — Ambulatory Visit: Payer: Medicare Other

## 2020-04-22 VITALS — BP 134/73 | HR 78 | Temp 98.7°F | Ht 64.0 in | Wt 170.6 lb

## 2020-04-22 DIAGNOSIS — Z0000 Encounter for general adult medical examination without abnormal findings: Secondary | ICD-10-CM | POA: Diagnosis not present

## 2020-04-22 NOTE — Progress Notes (Signed)
I connected with Chip Boer today by telephone and verified that I am speaking with the correct person using two identifiers. Location patient: home Location provider: work Persons participating in the virtual visit: Janayah Zavada, Glenna Durand LPN.   I discussed the limitations, risks, security and privacy concerns of performing an evaluation and management service by telephone and the availability of in person appointments. I also discussed with the patient that there may be a patient responsible charge related to this service. The patient expressed understanding and verbally consented to this telephonic visit.    Interactive audio and video telecommunications were attempted between this provider and patient, however failed, due to patient having technical difficulties OR patient did not have access to video capability.  We continued and completed visit with audio only.    Vital signs may be patient reported or missing.   Subjective:   Sarah White is a 74 y.o. female who presents for Medicare Annual (Subsequent) preventive examination.  Review of Systems     Cardiac Risk Factors include: advanced age (>79mn, >>46women);diabetes mellitus;dyslipidemia;hypertension;smoking/ tobacco exposure;sedentary lifestyle     Objective:    Today's Vitals   04/22/20 1256 04/22/20 1259  BP: 134/73   Pulse: 78   Temp: 98.7 F (37.1 C)   SpO2: 95%   Weight: 170 lb 9.6 oz (77.4 kg)   Height: _0  (1.626 m)   PainSc:  5    Body mass index is 29.28 kg/m.  Advanced Directives 04/22/2020 04/22/2020 03/08/2020 03/06/2020 01/23/2020 12/05/2019 12/04/2019  Does Patient Have a Medical Advance Directive? Yes Yes Yes - Yes No Unable to assess, patient is non-responsive or altered mental status  Type of Advance Directive Living will Living will - HErieLiving will;Out of facility DNR (pink MOST or yellow form) Living will - -  Does patient want to make changes to medical advance  directive? - - - No - Patient declined No - Patient declined - -  Copy of HLattain Chart? - - - - - - -  Would patient like information on creating a medical advance directive? - - - - - No - Patient declined -    Current Medications (verified) Outpatient Encounter Medications as of 04/22/2020  Medication Sig  . amiodarone (PACERONE) 200 MG tablet Take 100 mg by mouth daily.   .Marland Kitchenaspirin EC 81 MG tablet Take 81 mg by mouth at bedtime.   . budesonide-formoterol (SYMBICORT) 160-4.5 MCG/ACT inhaler Inhale 2 puffs into the lungs 2 (two) times daily.  .Marland KitchenbuPROPion (WELLBUTRIN SR) 150 MG 12 hr tablet Take 1 tablet (150 mg total) by mouth 2 (two) times daily.  . carvedilol (COREG) 12.5 MG tablet TAKE 2 TABLETS BY MOUTH TWICE DAILY WITH A MEAL  . clopidogrel (PLAVIX) 75 MG tablet Take 1 tablet (75 mg total) by mouth daily at 6 (six) AM.  . cyclobenzaprine (FLEXERIL) 10 MG tablet Take 1 tablet (10 mg total) by mouth 3 (three) times daily as needed for muscle spasms. DO NOT DRIVE ON THIS MEDICINE- WILL MAKE YOU SLEEPY  . diphenhydrAMINE (BENADRYL) 25 MG tablet Take 25 mg by mouth every 6 (six) hours as needed for allergies.   . Dulaglutide (TRULICITY) 1.5 MBP/1.0CHSOPN Inject 1.5 mg into the skin once a week. (Patient taking differently: Inject 1.5 mg into the skin once a week. Friday)  . furosemide (LASIX) 40 MG tablet Take 20 mg by mouth daily.   . isosorbide mononitrate (IMDUR) 30 MG  24 hr tablet Take 1 tablet (30 mg total) by mouth daily.  . Lidocaine 4 % PTCH Apply 1 patch topically every 12 (twelve) hours. (Patient taking differently: Apply 1 patch topically daily as needed (pain). )  . meclizine (ANTIVERT) 12.5 MG tablet Take 1 tablet (12.5 mg total) by mouth 3 (three) times daily as needed for dizziness.  . Multiple Vitamin (MULTIVITAMIN WITH MINERALS) TABS tablet Take 1 tablet by mouth daily.  . nitroGLYCERIN (NITROSTAT) 0.4 MG SL tablet Place 1 tablet (0.4 mg total) under  the tongue every 5 (five) minutes as needed for chest pain.  Marland Kitchen ondansetron (ZOFRAN-ODT) 4 MG disintegrating tablet Take 4 mg by mouth every 8 (eight) hours as needed for nausea or vomiting.   . rosuvastatin (CRESTOR) 20 MG tablet Take 1 tablet by mouth once daily  . sacubitril-valsartan (ENTRESTO) 49-51 MG Take 1 tablet by mouth 2 (two) times daily.   . sertraline (ZOLOFT) 100 MG tablet Take 2 tablets by mouth once daily  . SPIRIVA HANDIHALER 18 MCG inhalation capsule PLACE 1 CAPSULE INTO INHALER AND INHALE DAILY  . traZODone (DESYREL) 50 MG tablet TAKE 1/2 TO 1 (ONE-HALF TO ONE) TABLET BY MOUTH AT BEDTIME AS NEEDED FOR SLEEP (Patient taking differently: Take 50 mg by mouth at bedtime. )  . triamcinolone ointment (KENALOG) 0.5 % Apply 1 application topically 2 (two) times daily.  Marland Kitchen albuterol (VENTOLIN HFA) 108 (90 Base) MCG/ACT inhaler Inhale 2 puffs into the lungs every 6 (six) hours as needed for wheezing or shortness of breath. (Patient not taking: Reported on 04/18/2020)  . silver sulfADIAZINE (SILVADENE) 1 % cream Apply 1 application topically daily. (Patient not taking: Reported on 04/18/2020)   No facility-administered encounter medications on file as of 04/22/2020.    Allergies (verified) Amlodipine, Atorvastatin, Codeine, Hctz [hydrochlorothiazide], Lisinopril, and Metformin and related   History: Past Medical History:  Diagnosis Date  . Acute pancreatitis 09/09/2018  . Acute respiratory failure with hypoxia and hypercarbia ([place]) 07/22/2015  . Cancer ([place])    uterine  . CHF (congestive heart failure) ([place])   . COPD (chronic obstructive pulmonary disease) ([place])   . Diabetes mellitus without complication ([place])   . Encephalopathy acute 07/22/2015  . Hypertension   . Pneumonia    November 2016  . Pulmonary edema 07/22/2015  . 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 ABDOMINAL HYSTERECTOMY    . CAROTID PTA/STENT  INTERVENTION Right 03/06/2020   Procedure: CAROTID PTA/STENT INTERVENTION;  Surgeon: Katha Cabal, MD;  Location: [facility];  Service: Cardiovascular;  Laterality: Right;  . CARPAL TUNNEL RELEASE Bilateral   . CESAREAN SECTION    . CHOLECYSTECTOMY N/A 09/12/2018   Procedure: LAPAROSCOPIC CHOLECYSTECTOMY WITH INTRAOPERATIVE CHOLANGIOGRAM;  Surgeon: Herbert Pun, MD;  Location: [facility];  Service: General;  Laterality: N/A;  . CORONARY ANGIOPLASTY WITH STENT PLACEMENT    . KNEE SURGERY Left   . LEFT HEART CATH AND CORONARY ANGIOGRAPHY Right 11/26/2016   Procedure: Left Heart Cath and Coronary Angiography;  Surgeon: Isaias Cowman, MD;  Location: [facility];  Service: Cardiovascular;  Laterality: Right;  . LEFT HEART CATH AND CORONARY ANGIOGRAPHY N/A 01/23/2020   Procedure: LEFT HEART CATH AND CORONARY ANGIOGRAPHY;  Surgeon: Yolonda Kida, MD;  Location: [facility];  Service: Cardiovascular;  Laterality: N/A;   Family History  Problem Relation Age of Onset  . Heart disease Mother  Social History   Socioeconomic History  . Marital status: Divorced    Spouse name: Not on file  . Number of children: Not on file  . Years of education: Not on file  . Highest education level: Some college, no degree  Occupational History  . Occupation: retired  Tobacco Use  . Smoking status: Current Every Day Smoker    Packs/day: 1.00    Types: Cigarettes  . Smokeless tobacco: Never Used  Vaping Use  . Vaping Use: Never used  Substance and Sexual Activity  . Alcohol use: No  . Drug use: No  . Sexual activity: Not Currently  Other Topics Concern  . Not on file  Social History Narrative  . Not on file   Social Determinants of Health   Financial Resource Strain: Low Risk   . Difficulty of Paying Living Expenses: Not hard at all  Food Insecurity: No Food Insecurity  . Worried About Programme researcher, broadcasting/film/video in the Last Year: Never true  . Ran Out of  Food in the Last Year: Never true  Transportation Needs: No Transportation Needs  . Lack of Transportation (Medical): No  . Lack of Transportation (Non-Medical): No  Physical Activity: Inactive  . Days of Exercise per Week: 0 days  . Minutes of Exercise per Session: 0 min  Stress: No Stress Concern Present  . Feeling of Stress : Not at all  Social Connections: Socially Isolated  . Frequency of Communication with Friends and Family: More than three times a week  . Frequency of Social Gatherings with Friends and Family: More than three times a week  . Attends Religious Services: Never  . Active Member of Clubs or Organizations: No  . Attends Banker Meetings: Never  . Marital Status: Widowed    Tobacco Counseling Ready to quit: No Counseling given: Not Answered   Clinical Intake:  Pre-visit preparation completed: Yes  Pain : 0-10 Pain Score: 5  Pain Type: Acute pain Pain Location: Hip Pain Orientation: Right Pain Radiating Towards: none Pain Descriptors / Indicators: Aching Pain Onset: In the past 7 days Pain Frequency: Intermittent Pain Relieving Factors: rest except sleeping on that side  Pain Relieving Factors: rest except sleeping on that side  Nutritional Status: BMI 25 -29 Overweight Nutritional Risks: Nausea/ vomitting/ diarrhea (vomitted this morning, just came on suddenly) Diabetes: Yes  How often do you need to have someone help you when you read instructions, pamphlets, or other written materials from your doctor or pharmacy?: 1 - Never What is the last grade level you completed in school?: some college  Diabetic? Yes Nutrition Risk Assessment:  Has the patient had any N/V/D within the last 2 months?  Yes  Does the patient have any non-healing wounds?  No  Has the patient had any unintentional weight loss or weight gain?  No   Diabetes:  Is the patient diabetic?  Yes  If diabetic, was a CBG obtained today?  No  Did the patient bring in  their glucometer from home?  No  How often do you monitor your CBG's? daily.   Financial Strains and Diabetes Management:  Are you having any financial strains with the device, your supplies or your medication? No .  Does the patient want to be seen by Chronic Care Management for management of their diabetes?  No  Would the patient like to be referred to a Nutritionist or for Diabetic Management?  No   Diabetic Exams:  Diabetic Eye Exam: Overdue for  diabetic eye exam. Pt has been advised about the importance in completing this exam. Patient advised to call and schedule an eye exam. Diabetic Foot Exam: Completed 01/18/2020  Interpreter Needed?: No  Information entered by :: NAllen LPN   Activities of Daily Living In your present state of health, do you have any difficulty performing the following activities: 04/22/2020 03/06/2020  Hearing? N N  Vision? N N  Difficulty concentrating or making decisions? Y N  Comment short term -  Walking or climbing stairs? Y N  Comment needs supervision -  Dressing or bathing? N N  Doing errands, shopping? Y -  Comment someone always with her -  Preparing Food and eating ? N -  Using the Toilet? N -  In the past six months, have you accidently leaked urine? N -  Do you have problems with loss of bowel control? N -  Managing your Medications? N -  Managing your Finances? N -  Housekeeping or managing your Housekeeping? N -  Some recent data might be hidden    [name], [name] Yolonda Kida, MD as Consulting Physician (Cardiology) Yolonda Kida, MD as Consulting Physician (Cardiology) Vanita Ingles, RN as Case Manager (General Practice)  Indicate any recent Medical Services you may have received from other than [facility] providers in the past year (date may be approximate).     Assessment:   This is a routine wellness examination for Daaiyah.  Hearing/Vision screen  Hearing  Screening   _0  _1  _2  _3  _4  _5  _6  _7  _8   Right ear:           Left ear:           Vision Screening Comments: No regular eye exams,  Dietary issues and exercise activities discussed: Current Exercise Habits: The patient does not participate in regular exercise at present  Goals    .  Patient Stated      04/22/2020, keep living    .  PharmD "I can't afford my medications" (pt-stated)      CARE PLAN ENTRY (see longtitudinal plan of care for additional care plan information)  Current Barriers:  . Social, financial, or community barriers:  o Reports her biggest concern today is worrying about bleeding. She notes that the "smallest thing" will cause her to bleed, and she bleeds through bandages o Dull leg pain: no benefit w/ APAP, Voltaren, some benefit last night w/ aspercream w/ lidocaine. Pain stays the same whether walking or sitting. Back of both legs is where it hurts.  . Polypharmacy; complex patient with multiple comorbidities including HFrEF (EF 30-35%), CAD, COPD, DM . Most recent eGFR: >60 mL/min o HFrEF: Entresto 49/51 mg BID, carvedilol 25 mg BID, furosemide 20 mg daily; isosorbide 60 mg QAM  - Weighing daily: weight this morning 169 - BP: 100/62, HR 73; 113/68, 74; 120/60, 74; 138/69, 75 o Vtach: amiodarone 100 mg BID o ASCVD risk reduction: rosuvastatin 20 mg daily, last LDL at goal 70; ASA 81 mg daily, clopidogrel 75 mg daily (s/p carotid stent placement) o COPD: Symbicort 160/4.5 mcg 2 puffs BID, Spiriva 18 mg daily   - Receiving Symbicort through [place] through 08/30/20 - Did not qualify for Spiriva assistance, given income o [id number]: Trulicity 1.5 mg weekly, last A1c 9.1% - Receiving Trulicity through [employer] assistance through 08/30/20  Pharmacist Clinical Goal(s):  Marland Kitchen Over the next 90 days, patient will work with PharmD  and provider towards optimized medication management  Interventions: . Comprehensive medication review  performed; medication list updated in electronic medical record . Inter-disciplinary care team collaboration (see longitudinal plan of care) . Reviewed to continue DAPT until directed otherwise by Vascular. Reviewed purpose of DAPT to prevent stenosis and need for re-stenting. She verbalized understanding . Praised for continued control of glucose. Continue Trulicity.  . Discussed low-normal BP. Encouraged to take BP machine and/or readings w/ her to Dr. Clayborn Bigness f/u in ~2 weeks. Discussed that he may decide to reduce doses of one of her medications. She verbalized understanding . Moving forward, could consider streamlining therapy by switching to triple therapy Breztri or Trelegy. This would reduce the #copays patient pays.  . Reviewed that if pain worsens or leg becomes hot to contact office for evaluation. She verbalized understanding. She plans to discuss w/ PCP at next appt.  Patient Self Care Activities:  . Patient will take medications as prescribed  Please see past updates related to this goal by clicking on the "Past Updates" button in the selected goal      .  Quit Smoking      Smoking cessation discussed    .  RNCM: Pt-"They said it happened because of the fluid around my heart" (pt-stated)      CARE PLAN ENTRY (see longtitudinal plan of care for additional care plan information)  Current Barriers:  . Chronic Disease Management support, education, and care coordination needs related to CHF, CAD, HLD, COPD, DMII, Anxiety, and Depression  Clinical Goal(s) related to CHF, CAD, HLD, COPD, DMII, Anxiety, and Depression:  Over the next 120 days, patient will:  . Work with the care management team to address educational, disease management, and care coordination needs  . Begin or continue self health monitoring activities as directed today Measure and record cbg (blood glucose) 3 times weekly or more if taking steroids, Measure and record blood pressure 5 times per week, Measure and  record weight daily, and adhere to a heart healthy/ADA diet . Call provider office for new or worsened signs and symptoms Blood glucose findings outside established parameters, Blood pressure findings outside established parameters, Weight outside established parameters, Oxygen saturation lower than established parameter, Chest pain, Shortness of breath, and New or worsened symptom related to depression and other chronic conditions . Call care management team with questions or concerns . Verbalize basic understanding of patient centered plan of care established today  Interventions related to CHF, CAD, HLD, COPD, DMII, Anxiety, and Depression:  . Evaluation of current treatment plans and patient's adherence to plan as established by provider. The patient had a heart catheterization on 01/23/2020.  She is doing well. Has blockages but states she is not going to elect to have surgery. The patient verbalized she goes back to see Dr. Clayborn Bigness on 01-31-2020 and will discuss treatment options.   She had called the office yesterday because her leg was numb.  They instructed her to "walk it out".  The patient denies any new issues with numbness of leg today.  The patient was getting ready to call the office today because she was reading her instructions on caring for the insertion site and was confused. The discharge instructions AVS was pulled up and instructions provided to the patient on her level of understanding. The patient states she understands how to care for the site now. The patient states the gauze is dry and intact. Review of signs and symptoms of infection and to call Dr. Clayborn Bigness for  changes in site or worsening condition. 03-20-2020: The patient had a right carotid PTA with stent placement on 03-06-2020.  The patient was in the hospital for 3 days. She is doing well and has no new concerns at this time. No s/s of infection at the site. Says her headaches have not gone away but feels it is due to arthritis.  The patient follows up with Dr. Clayborn Bigness in August.  . Assessed patient understanding of disease states.  The patient has a good understanding of her chronic conditions. The patient states she did not have changes in her weight at home. Review of her normal heart failure signs and symptoms. The patient has not weighed today but her weight is staying around 176.  The patient usually waits until later in the morning to weigh because of her balance first thing in the am. 03-20-2020: the patient states that her wt is up and down but not ever < or > 2 pounds. The patient knows to call the provider for weight changes > or < than parameters set by the provider.  . Assessed patient's education and care coordination needs.  The patient has several upcoming appointments but has them all written down. The patient has to see a vein specialist because she has bilateral blockages in her carotid arteries. The patient will make an appointment to see the specialist.  The patient has a follow up appointment with Dr. Clayborn Bigness on 01-31-2020.  03-20-2020: The patient states that she does not have to have the left side done that it is okay. She has had no problems with the right side and is recovering well post procedure.  . Provided disease specific education to patient.  Education on watching sodium content of foods. The patient verbalized she is doing a better job at this. The patient has only been checking her blood sugars every 3 days. The patient had not checked her blood sugar this am but yesterday before her heart catheterization it was 94.  Education on s/s of hypoglycemia/hyperglycemia and checking more frequently if she feels differently.  The patient blood pressure is stable. She is weighing daily and also she is taking her blood pressure regularly and recording. 03-20-2020: The patient verbalized her blood sugars are ranging 110-140.  The patient says when she was in the hospital that she had some low readings and they had to  give her orange juice. Nash Dimmer with appropriate clinical care team members regarding patient needs.  Will refer LCSW for assistance with stressors and ways to relieve anxiety to reduce the incidence of smoking. Also the patient feels she is being a burden to her son. She denies suicidal ideation but says he has put his life on hold because of her. Education and support given today.  On going support and education from pharmacy for questions/concerns related to new medications. Will continue to monitor for changes.  . Review of upcoming appointments: Sees Dr. Clayborn Bigness on August and Dr. Wynetta Emery on 04-18-2020, sees the specialist on 04-08-2020. Marland Kitchen CCM team numbers and roles discussed with the patient and she agrees to work with the team.   Patient Self Care Activities related to CHF, CAD, HLD, COPD, DMII, Anxiety, and Depression:  . Patient is unable to independently self-manage chronic health conditions  Please see past updates related to this goal by clicking on the "Past Updates" button in the selected goal        Depression Screen [facility] 2/9 Scores 04/22/2020 04/21/2020 01/19/2020 12/11/2019 09/20/2019 08/21/2019 02/15/2019  PHQ - 2 Score 5 2 0 0 0 1 0  PHQ- 9 Score 5 7 0 - 4 6 -    Fall Risk Fall Risk  04/22/2020 04/21/2020 01/19/2020 05/23/2019 02/21/2019  Falls in the past year? 1 1 0 1 0  Comment got dizzy - - - -  Number falls in past yr: 0 1 0 0 -  Injury with Fall? 0 0 0 0 -  Risk for fall due to : Impaired balance/gait;Medication side effect;History of fall(s) History of fall(s) - History of fall(s) -  Follow up Falls evaluation completed;Education provided;Falls prevention discussed - - Falls evaluation completed Falls evaluation completed    Any stairs in or around the home? Yes  If so, are there any without handrails? No  Home free of loose throw rugs in walkways, pet beds, electrical cords, etc? Yes  Adequate lighting in your home to reduce risk of falls? Yes   ASSISTIVE DEVICES  UTILIZED TO PREVENT FALLS:  Life alert? No  Use of a cane, walker or w/c? Yes  Grab bars in the bathroom? Yes  Shower chair or bench in shower? Yes  Elevated toilet seat or a handicapped toilet? Yes   TIMED UP AND GO:  Was the test performed? No .      Cognitive Function:     6CIT Screen 04/22/2020 02/15/2019 09/30/2017  What Year? 0 points 0 points 0 points  What month? 0 points 0 points 0 points  What time? 3 points 0 points 0 points  Count back from 20 0 points 0 points 0 points  Months in reverse 4 points 0 points 0 points  Repeat phrase 4 points 2 points 0 points  Total Score 11 2 0    Immunizations Immunization History  Administered Date(s) Administered  . Fluad Quad(high Dose 65+) 04/28/2019  . Influenza, High Dose Seasonal PF 05/18/2016, 05/25/2017, 05/05/2018  . Influenza, Seasonal, Injecte, Preservative Fre 06/05/2015  . PFIZER SARS-COV-2 Vaccination 10/16/2019, 11/06/2019  . Pneumococcal Conjugate-13 07/29/2015  . Pneumococcal Polysaccharide-23 09/15/2016  . Tdap 09/24/2015  . Zoster 02/07/2015    TDAP status: Up to date Flu Vaccine status: Up to date Pneumococcal vaccine status: Up to date Covid-19 vaccine status: Completed vaccines  Qualifies for Shingles Vaccine? Yes   Zostavax completed Yes   Shingrix Completed?: No.    Education has been provided regarding the importance of this vaccine. Patient has been advised to call insurance company to determine out of pocket expense if they have not yet received this vaccine. Advised may also receive vaccine at local pharmacy or Health Dept. Verbalized acceptance and understanding.  Screening Tests Health Maintenance  Topic Date Due  . OPHTHALMOLOGY EXAM  09/16/2016  . INFLUENZA VACCINE  03/31/2020  . MAMMOGRAM  05/21/2020 (Originally 07/05/1998)  . COLONOSCOPY  05/21/2020 (Originally 07/05/1998)  . DEXA SCAN  04/22/2021 (Originally 07/05/2013)  . URINE MICROALBUMIN  08/21/2020  . HEMOGLOBIN A1C  10/19/2020    . FOOT EXAM  01/17/2021  . TETANUS/TDAP  09/23/2025  . COVID-19 Vaccine  Completed  . Hepatitis C Screening  Completed  . PNA vac Low Risk Adult  Completed    Health Maintenance  Health Maintenance Due  Topic Date Due  . OPHTHALMOLOGY EXAM  09/16/2016  . INFLUENZA VACCINE  03/31/2020    Colorectal cancer screening: Decline Mammogram status: Decline Bone Density status: Decline  Lung Cancer Screening: (Low Dose CT Chest recommended if Age 25-80 years, 30 pack-year currently smoking OR have quit w/in 15years.) does  not qualify.   Lung Cancer Screening Referral: no  Additional Screening:  Hepatitis C Screening: does qualify; Completed 08/12/2015  Vision Screening: Recommended annual ophthalmology exams for early detection of glaucoma and other disorders of the eye. Is the patient up to date with their annual eye exam?  No  Who is the provider or what is the name of the office in which the patient attends annual eye exams? none If pt is not established with a provider, would they like to be referred to a provider to establish care? No .   Dental Screening: Recommended annual dental exams for proper oral hygiene  Community Resource Referral / Chronic Care Management: CRR required this visit?  No   CCM required this visit?  No      Plan:     I have personally reviewed and noted the following in the patient's chart:   . Medical and social history . Use of alcohol, tobacco or illicit drugs  . Current medications and supplements . Functional ability and status . Nutritional status . Physical activity . Advanced directives . List of other physicians . Hospitalizations, surgeries, and ER visits in previous 12 months . Vitals . Screenings to include cognitive, depression, and falls . Referrals and appointments  In addition, I have reviewed and discussed with patient certain preventive protocols, quality metrics, and best practice recommendations. A written  personalized care plan for preventive services as well as general preventive health recommendations were provided to patient.     Kellie Simmering, LPN   [id number]/0964   Nurse Notes: 6 CIT was 11. She states that she has been having trouble with short term memory.

## 2020-04-22 NOTE — Patient Instructions (Signed)
Sarah White , Thank you for taking time to come for your Medicare Wellness Visit. I appreciate your ongoing commitment to your health goals. Please review the following plan we discussed and let me know if I can assist you in the future.   Screening recommendations/referrals: Colonoscopy: decline Mammogram: decline Bone Density: decline Recommended yearly ophthalmology/optometry visit for glaucoma screening and checkup Recommended yearly dental visit for hygiene and checkup  Vaccinations: Influenza vaccine: due Pneumococcal vaccine: completed 09/15/2016 Tdap vaccine: completed 09/24/2015 Shingles vaccine: discussed   Covid-19: 11/06/2019, 10/16/2019  Advanced directives: copy in chart  Conditions/risks identified: smoking  Next appointment: Follow up in one year for your annual wellness visit    Preventive Care 18 Years and Older, Female Preventive care refers to lifestyle choices and visits with your health care provider that can promote health and wellness. What does preventive care include?  A yearly physical exam. This is also called an annual well check.  Dental exams once or twice a year.  Routine eye exams. Ask your health care provider how often you should have your eyes checked.  Personal lifestyle choices, including:  Daily care of your teeth and gums.  Regular physical activity.  Eating a healthy diet.  Avoiding tobacco and drug use.  Limiting alcohol use.  Practicing safe sex.  Taking low-dose aspirin every day.  Taking vitamin and mineral supplements as recommended by your health care provider. What happens during an annual well check? The services and screenings done by your health care provider during your annual well check will depend on your age, overall health, lifestyle risk factors, and family history of disease. Counseling  Your health care provider may ask you questions about your:  Alcohol use.  Tobacco use.  Drug use.  Emotional  well-being.  Home and relationship well-being.  Sexual activity.  Eating habits.  History of falls.  Memory and ability to understand (cognition).  Work and work Statistician.  Reproductive health. Screening  You may have the following tests or measurements:  Height, weight, and BMI.  Blood pressure.  Lipid and cholesterol levels. These may be checked every 5 years, or more frequently if you are over 20 years old.  Skin check.  Lung cancer screening. You may have this screening every year starting at age 61 if you have a 30-pack-year history of smoking and currently smoke or have quit within the past 15 years.  Fecal occult blood test (FOBT) of the stool. You may have this test every year starting at age 72.  Flexible sigmoidoscopy or colonoscopy. You may have a sigmoidoscopy every 5 years or a colonoscopy every 10 years starting at age 42.  Hepatitis C blood test.  Hepatitis B blood test.  Sexually transmitted disease (STD) testing.  Diabetes screening. This is done by checking your blood sugar (glucose) after you have not eaten for a while (fasting). You may have this done every 1-3 years.  Bone density scan. This is done to screen for osteoporosis. You may have this done starting at age 32.  Mammogram. This may be done every 1-2 years. Talk to your health care provider about how often you should have regular mammograms. Talk with your health care provider about your test results, treatment options, and if necessary, the need for more tests. Vaccines  Your health care provider may recommend certain vaccines, such as:  Influenza vaccine. This is recommended every year.  Tetanus, diphtheria, and acellular pertussis (Tdap, Td) vaccine. You may need a Td booster every 10 years.  Zoster vaccine. You may need this after age 61.  Pneumococcal 13-valent conjugate (PCV13) vaccine. One dose is recommended after age 54.  Pneumococcal polysaccharide (PPSV23) vaccine. One  dose is recommended after age 41. Talk to your health care provider about which screenings and vaccines you need and how often you need them. This information is not intended to replace advice given to you by your health care provider. Make sure you discuss any questions you have with your health care provider. Document Released: 09/13/2015 Document Revised: 05/06/2016 Document Reviewed: 06/18/2015 Elsevier Interactive Patient Education  2017 [place] Prevention in the Home Falls can cause injuries. They can happen to people of all ages. There are many things you can do to make your home safe and to help prevent falls. What can I do on the outside of my home?  Regularly fix the edges of walkways and driveways and fix any cracks.  Remove anything that might make you trip as you walk through a door, such as a raised step or threshold.  Trim any bushes or trees on the path to your home.  Use bright outdoor lighting.  Clear any walking paths of anything that might make someone trip, such as rocks or tools.  Regularly check to see if handrails are loose or broken. Make sure that both sides of any steps have handrails.  Any raised decks and porches should have guardrails on the edges.  Have any leaves, snow, or ice cleared regularly.  Use sand or salt on walking paths during winter.  Clean up any spills in your garage right away. This includes oil or grease spills. What can I do in the bathroom?  Use night lights.  Install grab bars by the toilet and in the tub and shower. Do not use towel bars as grab bars.  Use non-skid mats or decals in the tub or shower.  If you need to sit down in the shower, use a plastic, non-slip stool.  Keep the floor dry. Clean up any water that spills on the floor as soon as it happens.  Remove soap buildup in the tub or shower regularly.  Attach bath mats securely with double-sided non-slip rug tape.  Do not have throw rugs and other  things on the floor that can make you trip. What can I do in the bedroom?  Use night lights.  Make sure that you have a light by your bed that is easy to reach.  Do not use any sheets or blankets that are too big for your bed. They should not hang down onto the floor.  Have a firm chair that has side arms. You can use this for support while you get dressed.  Do not have throw rugs and other things on the floor that can make you trip. What can I do in the kitchen?  Clean up any spills right away.  Avoid walking on wet floors.  Keep items that you use a lot in easy-to-reach places.  If you need to reach something above you, use a strong step stool that has a grab bar.  Keep electrical cords out of the way.  Do not use floor polish or wax that makes floors slippery. If you must use wax, use non-skid floor wax.  Do not have throw rugs and other things on the floor that can make you trip. What can I do with my stairs?  Do not leave any items on the stairs.  Make sure that there are  handrails on both sides of the stairs and use them. Fix handrails that are broken or loose. Make sure that handrails are as long as the stairways.  Check any carpeting to make sure that it is firmly attached to the stairs. Fix any carpet that is loose or worn.  Avoid having throw rugs at the top or bottom of the stairs. If you do have throw rugs, attach them to the floor with carpet tape.  Make sure that you have a light switch at the top of the stairs and the bottom of the stairs. If you do not have them, ask someone to add them for you. What else can I do to help prevent falls?  Wear shoes that:  Do not have high heels.  Have rubber bottoms.  Are comfortable and fit you well.  Are closed at the toe. Do not wear sandals.  If you use a stepladder:  Make sure that it is fully opened. Do not climb a closed stepladder.  Make sure that both sides of the stepladder are locked into place.  Ask  someone to hold it for you, if possible.  Clearly mark and make sure that you can see:  Any grab bars or handrails.  First and last steps.  Where the edge of each step is.  Use tools that help you move around (mobility aids) if they are needed. These include:  Canes.  Walkers.  Scooters.  Crutches.  Turn on the lights when you go into a dark area. Replace any light bulbs as soon as they burn out.  Set up your furniture so you have a clear path. Avoid moving your furniture around.  If any of your floors are uneven, fix them.  If there are any pets around you, be aware of where they are.  Review your medicines with your doctor. Some medicines can make you feel dizzy. This can increase your chance of falling. Ask your doctor what other things that you can do to help prevent falls. This information is not intended to replace advice given to you by your health care provider. Make sure you discuss any questions you have with your health care provider. Document Released: 06/13/2009 Document Revised: 01/23/2016 Document Reviewed: 09/21/2014 Elsevier Interactive Patient Education  2017 [employer].

## 2020-04-24 NOTE — Telephone Encounter (Signed)
Pt has been r/s for 04/26/2020

## 2020-04-26 ENCOUNTER — Telehealth: Payer: Self-pay | Admitting: Family Medicine

## 2020-04-26 ENCOUNTER — Telehealth: Payer: Medicare Other

## 2020-04-26 ENCOUNTER — Telehealth: Payer: Self-pay | Admitting: Licensed Clinical Social Worker

## 2020-04-26 NOTE — Telephone Encounter (Signed)
Chronic Care Management    Clinical Social Work General Follow Up Note  04/26/2020 Name: Sarah White MRN: [id number] DOB: 01-Jul-1946  Sarah White is a 74 y.o. year old female who is a primary care patient of Valerie Roys, DO. The CCM team was consulted for assistance with Mental Health Counseling and Resources.   Review of patient status, including review of consultants reports, relevant laboratory and other test results, and collaboration with appropriate care team members and the patient's provider was performed as part of comprehensive patient evaluation and provision of chronic care management services.    LCSW completed CCM outreach attempt today but was unable to reach patient successfully. A HIPPA compliant voice message was left encouraging patient to return call once available. LCSW rescheduled CCM SW appointment as well.  Outpatient Encounter Medications as of 04/26/2020  Medication Sig Note  . albuterol (VENTOLIN HFA) 108 (90 Base) MCG/ACT inhaler Inhale 2 puffs into the lungs every 6 (six) hours as needed for wheezing or shortness of breath. (Patient not taking: Reported on 04/18/2020) 03/26/2020: Taking 2 puffs BID  . amiodarone (PACERONE) 200 MG tablet Take 100 mg by mouth daily.    Marland Kitchen aspirin EC 81 MG tablet Take 81 mg by mouth at bedtime.    . budesonide-formoterol (SYMBICORT) 160-4.5 MCG/ACT inhaler Inhale 2 puffs into the lungs 2 (two) times daily.   Marland Kitchen buPROPion (WELLBUTRIN SR) 150 MG 12 hr tablet Take 1 tablet (150 mg total) by mouth 2 (two) times daily.   . carvedilol (COREG) 12.5 MG tablet TAKE 2 TABLETS BY MOUTH TWICE DAILY WITH A MEAL   . clopidogrel (PLAVIX) 75 MG tablet Take 1 tablet (75 mg total) by mouth daily at 6 (six) AM.   . cyclobenzaprine (FLEXERIL) 10 MG tablet Take 1 tablet (10 mg total) by mouth 3 (three) times daily as needed for muscle spasms. DO NOT DRIVE ON THIS MEDICINE- WILL MAKE YOU SLEEPY   . diphenhydrAMINE (BENADRYL) 25 MG tablet Take 25 mg by  mouth every 6 (six) hours as needed for allergies.  03/26/2020: Taking 1 QPM  . Dulaglutide (TRULICITY) 1.5 GH/8.2XH SOPN Inject 1.5 mg into the skin once a week. (Patient taking differently: Inject 1.5 mg into the skin once a week. Friday)   . furosemide (LASIX) 40 MG tablet Take 20 mg by mouth daily.    . isosorbide mononitrate (IMDUR) 30 MG 24 hr tablet Take 1 tablet (30 mg total) by mouth daily.   . Lidocaine 4 % PTCH Apply 1 patch topically every 12 (twelve) hours. (Patient taking differently: Apply 1 patch topically daily as needed (pain). )   . meclizine (ANTIVERT) 12.5 MG tablet Take 1 tablet (12.5 mg total) by mouth 3 (three) times daily as needed for dizziness.   . Multiple Vitamin (MULTIVITAMIN WITH MINERALS) TABS tablet Take 1 tablet by mouth daily.   . nitroGLYCERIN (NITROSTAT) 0.4 MG SL tablet Place 1 tablet (0.4 mg total) under the tongue every 5 (five) minutes as needed for chest pain.   Marland Kitchen ondansetron (ZOFRAN-ODT) 4 MG disintegrating tablet Take 4 mg by mouth every 8 (eight) hours as needed for nausea or vomiting.    . rosuvastatin (CRESTOR) 20 MG tablet Take 1 tablet by mouth once daily   . sacubitril-valsartan (ENTRESTO) 49-51 MG Take 1 tablet by mouth 2 (two) times daily.    . sertraline (ZOLOFT) 100 MG tablet Take 2 tablets by mouth once daily   . silver sulfADIAZINE (SILVADENE) 1 % cream Apply  1 application topically daily. (Patient not taking: Reported on 04/18/2020)   . SPIRIVA HANDIHALER 18 MCG inhalation capsule PLACE 1 CAPSULE INTO INHALER AND INHALE DAILY   . traZODone (DESYREL) 50 MG tablet TAKE 1/2 TO 1 (ONE-HALF TO ONE) TABLET BY MOUTH AT BEDTIME AS NEEDED FOR SLEEP (Patient taking differently: Take 50 mg by mouth at bedtime. )   . triamcinolone ointment (KENALOG) 0.5 % Apply 1 application topically 2 (two) times daily.    No facility-administered encounter medications on file as of 04/26/2020.    Follow Up Plan: Embedded care coordination team will continue to follow  patient progress and assist with care coordination needs.   Eula Fried, BSW, MSW, [facility] Care Management [email or web address] Phone: [phone]

## 2020-04-26 NOTE — Telephone Encounter (Signed)
Copied from [place] [id number]. Topic: General - Inquiry >> Apr 26, 2020  2:20 PM Alease Frame wrote: Reason for CRM: pt is requesting a phone call back from office regarding medication issues . Please advise

## 2020-04-29 NOTE — Telephone Encounter (Signed)
Called and spoke to patient. She states that all she wanted to let us know is that she does not answer calls from unknown numbers. She states that Pam informed her that Almyra Free, our pharmacist, had been trying to call her but could not get in touch with her. Patient asked if I could send Almyra Free a message and let her know this and asked that she leave her a VM with her name and number so she could call her back.   Staff message sent to [place] regarding this information.

## 2020-05-04 ENCOUNTER — Other Ambulatory Visit: Payer: Self-pay | Admitting: Family Medicine

## 2020-05-04 NOTE — Telephone Encounter (Signed)
Requested Prescriptions  Pending Prescriptions Disp Refills  . traZODone (DESYREL) 50 MG tablet [Pharmacy Med Name: traZODone HCl 50 MG Oral Tablet] 90 tablet 0    Sig: TAKE 1/2 TO 1 (ONE-HALF TO ONE) TABLET BY MOUTH AT BEDTIME AS NEEDED FOR SLEEP     Psychiatry: Antidepressants - Serotonin Modulator Passed - 05/04/2020  5:30 AM      Passed - Completed PHQ-2 or PHQ-9 in the last 360 days.      Passed - Valid encounter within last 6 months    Recent Outpatient Visits          2 weeks ago Mixed hyperlipidemia   [facility] [place], [name], DO   2 months ago Skin lesion   [facility] [place], [name], DO   2 months ago Skin lesion   [facility] [place], [name], DO   3 months ago Type 2 diabetes mellitus with stage 3 chronic kidney disease, without long-term current use of insulin, unspecified whether stage 3a or 3b CKD ([place])   [place], [name], DO   4 months ago COPD exacerbation [facility])   [place], [place], DO      Future Appointments            In 1 week Wynetta Emery, Barb Merino, DO [place], [facility]   In 56 months  [employer], [facility]

## 2020-05-07 ENCOUNTER — Encounter: Payer: Self-pay | Admitting: Family Medicine

## 2020-05-17 ENCOUNTER — Ambulatory Visit (INDEPENDENT_AMBULATORY_CARE_PROVIDER_SITE_OTHER): Payer: Medicare Other | Admitting: Family Medicine

## 2020-05-17 ENCOUNTER — Encounter: Payer: Self-pay | Admitting: Family Medicine

## 2020-05-17 ENCOUNTER — Other Ambulatory Visit: Payer: Self-pay

## 2020-05-17 VITALS — BP 93/63 | HR 73 | Temp 98.5°F | Ht 64.0 in | Wt 169.0 lb

## 2020-05-17 DIAGNOSIS — R443 Hallucinations, unspecified: Secondary | ICD-10-CM

## 2020-05-17 DIAGNOSIS — R269 Unspecified abnormalities of gait and mobility: Secondary | ICD-10-CM | POA: Diagnosis not present

## 2020-05-17 DIAGNOSIS — I952 Hypotension due to drugs: Secondary | ICD-10-CM

## 2020-05-17 DIAGNOSIS — R2689 Other abnormalities of gait and mobility: Secondary | ICD-10-CM | POA: Diagnosis not present

## 2020-05-17 DIAGNOSIS — R42 Dizziness and giddiness: Secondary | ICD-10-CM | POA: Diagnosis not present

## 2020-05-17 DIAGNOSIS — I208 Other forms of angina pectoris: Secondary | ICD-10-CM | POA: Diagnosis not present

## 2020-05-17 DIAGNOSIS — N3000 Acute cystitis without hematuria: Secondary | ICD-10-CM

## 2020-05-17 DIAGNOSIS — R8281 Pyuria: Secondary | ICD-10-CM | POA: Diagnosis not present

## 2020-05-17 NOTE — Patient Instructions (Signed)
Cut the isosorbide mononitrate in 1/2 for 15 mg

## 2020-05-17 NOTE — Progress Notes (Signed)
BP 93/63 (BP Location: Left Arm, Patient Position: Sitting)   Pulse 73   Temp 98.5 F (36.9 C) (Oral)   Ht 5\' 4"  (1.626 m)   Wt 169 lb (76.7 kg)   SpO2 96%   BMI 29.01 kg/m    Subjective:    [name], female    DOB: Mar 11, 1946, 74 y.o.   MRN: [id number]  HPI: Sarah White is a 74 y.o. female  Chief Complaint  Patient presents with  . Hypertension  . Fall    x3 falls within the past 2 weeks  . Hallucinations    X1 week, comes and goes, seeing things that are not there, looked at her hand and writing was on them, sees stuff that isnt there   . Flu Vaccine  . bruising easily    bleeds easily, pt stated she bleeds sometimes and cant tell sometimes until she sees it.   Sarah White presents today with her grandson. She has not been doing well over the past 2 weeks. She notes that she has been falling a lot more. She feels like her legs just give out on her and she will fall. She will also walk into walls and has been feeling a lot weaker. She has been leaning to the right when she is standing for about 2 weeks. Came on out of the blue. Since then she's been falling and hallucinating during that time as well. She has been hallucinating. Seems to be happening at random. She has been seeing outlets moving, Saw writing all over her hands. She is afraid that something may be very wrong. This has been going on for about a week. She has not noticed any other symptoms. No issues with her urine. No fevers. No chills. She has been weak in her legs. No other concerns or complaints at this time.   Relevant past medical, surgical, family and social history reviewed and updated as indicated. Interim medical history since our last visit reviewed. Allergies and medications reviewed and updated.  Review of Systems  Constitutional: Positive for activity change. Negative for appetite change, chills, diaphoresis, fatigue, fever and unexpected weight change.  HENT: Negative.   Respiratory:  Negative.   Cardiovascular: Negative.   Gastrointestinal: Negative.   Genitourinary: Negative.   Musculoskeletal: Negative.   Skin: Negative.   Neurological: Positive for dizziness, weakness and headaches. Negative for tremors, seizures, syncope, facial asymmetry, speech difficulty, light-headedness and numbness.  Hematological: Negative.   Psychiatric/Behavioral: Positive for hallucinations and sleep disturbance. Negative for agitation, behavioral problems, confusion, decreased concentration, dysphoric mood, self-injury and suicidal ideas. The patient is nervous/anxious. The patient is not hyperactive.     Per HPI unless specifically indicated above     Objective:    BP 93/63 (BP Location: Left Arm, Patient Position: Sitting)   Pulse 73   Temp 98.5 F (36.9 C) (Oral)   Ht 5\' 4"  (1.626 m)   Wt 169 lb (76.7 kg)   SpO2 96%   BMI 29.01 kg/m   Wt Readings from Last 3 Encounters:  05/17/20 169 lb (76.7 kg)  04/22/20 170 lb 9.6 oz (77.4 kg)  04/18/20 170 lb (77.1 kg)    Physical Exam Vitals and nursing note reviewed.  Constitutional:      General: She is not in acute distress.    Appearance: Normal appearance. She is not ill-appearing, toxic-appearing or diaphoretic.  HENT:     Head: Normocephalic and atraumatic.     Right Ear: External ear  normal.     Left Ear: External ear normal.     Nose: Nose normal.     Mouth/Throat:     Mouth: Mucous membranes are moist.     Pharynx: Oropharynx is clear.  Eyes:     General: No scleral icterus.       Right eye: No discharge.        Left eye: No discharge.     Extraocular Movements: Extraocular movements intact.     Conjunctiva/sclera: Conjunctivae normal.     Pupils: Pupils are equal, round, and reactive to light.  Cardiovascular:     Rate and Rhythm: Normal rate and regular rhythm.     Pulses: Normal pulses.     Heart sounds: Normal heart sounds. No murmur heard.  No friction rub. No gallop.   Pulmonary:     Effort: Pulmonary  effort is normal. No respiratory distress.     Breath sounds: Normal breath sounds. No stridor. No wheezing, rhonchi or rales.  Chest:     Chest wall: No tenderness.  Musculoskeletal:        General: Normal range of motion.     Cervical back: Normal range of motion and neck supple.  Skin:    General: Skin is warm and dry.     Capillary Refill: Capillary refill takes less than 2 seconds.     Coloration: Skin is not jaundiced or pale.     Findings: No bruising, erythema, lesion or rash.  Neurological:     General: No focal deficit present.     Mental Status: She is alert and oriented to person, place, and time. Mental status is at baseline.     Motor: Weakness present.     Gait: Gait abnormal.  Psychiatric:        Mood and Affect: Mood normal.        Behavior: Behavior normal.        Thought Content: Thought content normal.        Judgment: Judgment normal.     Results for orders placed or performed in visit on 05/17/20  Microscopic Examination   Urine  Result Value Ref Range   WBC, UA 6-10 (A) 0 - 5 /hpf   RBC 3-10 (A) 0 - 2 /hpf   Epithelial Cells (non renal) 0-10 0 - 10 /hpf   Casts Present None seen /lpf   Cast Type Granular casts (A) N/A   Crystals Present N/A   Crystal Type Calcium Oxalate N/A   Mucus, UA Present Not Estab.   Bacteria, UA Many (A) None seen/Few   Yeast, UA Present None seen  Urine Culture, Reflex   Urine  Result Value Ref Range   Urine Culture, Routine WILL FOLLOW   UA/M w/rflx Culture, Routine   Specimen: Urine   Urine  Result Value Ref Range   Specific Gravity, UA 1.025 1.005 - 1.030   pH, UA 5.0 5.0 - 7.5   Color, UA Yellow Yellow   Appearance Ur Clear Clear   Leukocytes,UA 2+ (A) Negative   Protein,UA Negative Negative/Trace   Glucose, UA Negative Negative   Ketones, UA Negative Negative   RBC, UA Trace (A) Negative   Bilirubin, UA Negative Negative   Urobilinogen, Ur 0.2 0.2 - 1.0 mg/dL   Nitrite, UA Negative Negative   Microscopic  Examination See below:    Urinalysis Reflex Comment  Assessment & Plan:   Problem List Items Addressed This Visit    None    Visit Diagnoses    Dizziness    -  Primary   New onset. Will check urine to see if there is UTI. Will check MRI to r/o stroke and get her into see neurology.   Relevant Orders   MR Brain W Wo Contrast   Ambulatory referral to Neurology   Balance problem       New onset. Will check urine to see if there is UTI. Will check MRI to r/o stroke and get her into see neurology.   Relevant Orders   MR Brain W Wo Contrast   Ambulatory referral to Neurology   Hallucination       Will check urine to see if there is UTI. Will check MRI to r/o stroke and get her into see neurology.    Relevant Orders   MR Brain W Wo Contrast   UA/M w/rflx Culture, Routine (Completed)   Ambulatory referral to Neurology   Hypotension due to drugs       Still running low. Continue to monitor. Call with any concerns.    Abnormal gait       New onset. Will check urine to see if there is UTI. Will check MRI to r/o stroke and get her into see neurology.    Relevant Orders   MR Brain W Wo Contrast   Ambulatory referral to Neurology   Acute cystitis without hematuria       Will treat with cipro and await culture. Call with any concerns.        Follow up plan: Return in about 1 week (around 05/24/2020).

## 2020-05-20 ENCOUNTER — Other Ambulatory Visit: Payer: Medicare Other

## 2020-05-20 ENCOUNTER — Other Ambulatory Visit: Payer: Self-pay

## 2020-05-20 MED ORDER — CIPROFLOXACIN HCL 500 MG PO TABS: 500.0000 mg | ORAL_TABLET | Freq: Two times a day (BID) | ORAL | 0 refills | Status: DC

## 2020-05-21 ENCOUNTER — Telehealth: Payer: Medicare Other

## 2020-05-24 ENCOUNTER — Encounter: Payer: Self-pay | Admitting: Family Medicine

## 2020-05-24 ENCOUNTER — Other Ambulatory Visit: Payer: Self-pay

## 2020-05-24 ENCOUNTER — Ambulatory Visit (INDEPENDENT_AMBULATORY_CARE_PROVIDER_SITE_OTHER): Payer: Medicare Other | Admitting: Family Medicine

## 2020-05-24 VITALS — BP 100/58 | HR 71 | Temp 98.3°F | Wt 168.0 lb

## 2020-05-24 DIAGNOSIS — I208 Other forms of angina pectoris: Secondary | ICD-10-CM

## 2020-05-24 DIAGNOSIS — R443 Hallucinations, unspecified: Secondary | ICD-10-CM | POA: Diagnosis not present

## 2020-05-24 DIAGNOSIS — R42 Dizziness and giddiness: Secondary | ICD-10-CM | POA: Diagnosis not present

## 2020-05-24 DIAGNOSIS — N3001 Acute cystitis with hematuria: Secondary | ICD-10-CM

## 2020-05-24 DIAGNOSIS — Z23 Encounter for immunization: Secondary | ICD-10-CM | POA: Diagnosis not present

## 2020-05-24 NOTE — Telephone Encounter (Signed)
-----   Message from Valerie Roys, [place] sent at 05/24/2020  4:01 PM EDT ----- Can we confirm the cipro is still ready at the [facility]? She never picked it up

## 2020-05-24 NOTE — Telephone Encounter (Signed)
Sarah White and they stated patient picked the cipro today. FYI

## 2020-05-24 NOTE — Progress Notes (Signed)
BP (!) 100/58   Pulse 71   Temp 98.3 F (36.8 C) (Oral)   Wt 168 lb (76.2 kg)   SpO2 96%   BMI 28.84 kg/m    Subjective:    [name], female    DOB: [birth year], 74 y.o.   MRN: [id number]  HPI: Sarah White is a 74 y.o. female  Chief Complaint  Patient presents with  . Dizziness    1w flu   Sarah White presents today with her grandson. She is not feeling any better. She is about the same. She notes that her bruising and bleeding has gotten a little better. She hasn't fallen since her last visit, but her balance is still off. She is still leaning to the R. She is still walking into walls. She didn't get the cipro as she didn't get the message that she needed it. She has not noticed any urinary symptoms. Se notes that she is still hallucinating. This is not scaring her as she knows it's not real, but it's disturbing. She does note that it makes it harder to sleep at night. She has otherwise been doing OK.   Relevant past medical, surgical, family and social history reviewed and updated as indicated. Interim medical history since our last visit reviewed. Allergies and medications reviewed and updated.  Review of Systems  Constitutional: Positive for fatigue. Negative for activity change, appetite change, chills, diaphoresis, fever and unexpected weight change.  Respiratory: Negative.   Cardiovascular: Negative.   Gastrointestinal: Negative.   Genitourinary: Negative.   Musculoskeletal: Negative.   Skin: Negative.   Neurological: Positive for dizziness, weakness and light-headedness. Negative for tremors, seizures, syncope, facial asymmetry, speech difficulty, numbness and headaches.  Psychiatric/Behavioral: Positive for confusion and hallucinations. Negative for agitation, behavioral problems, decreased concentration, dysphoric mood, self-injury, sleep disturbance and suicidal ideas. The patient is not nervous/anxious and is not hyperactive.     Per HPI unless  specifically indicated above     Objective:    BP (!) 100/58   Pulse 71   Temp 98.3 F (36.8 C) (Oral)   Wt 168 lb (76.2 kg)   SpO2 96%   BMI 28.84 kg/m   Wt Readings from Last 3 Encounters:  05/24/20 168 lb (76.2 kg)  05/17/20 169 lb (76.7 kg)  04/22/20 170 lb 9.6 oz (77.4 kg)    Physical Exam Vitals and nursing note reviewed.  Constitutional:      General: She is not in acute distress.    Appearance: Normal appearance. She is not ill-appearing, toxic-appearing or diaphoretic.  HENT:     Head: Normocephalic and atraumatic.     Right Ear: External ear normal.     Left Ear: External ear normal.     Nose: Nose normal.     Mouth/Throat:     Mouth: Mucous membranes are moist.     Pharynx: Oropharynx is clear.  Eyes:     General: No scleral icterus.       Right eye: No discharge.        Left eye: No discharge.     Extraocular Movements: Extraocular movements intact.     Conjunctiva/sclera: Conjunctivae normal.     Pupils: Pupils are equal, round, and reactive to light.  Cardiovascular:     Rate and Rhythm: Normal rate and regular rhythm.     Pulses: Normal pulses.     Heart sounds: Normal heart sounds. No murmur heard.  No friction rub. No gallop.   Pulmonary:  Effort: Pulmonary effort is normal. No respiratory distress.     Breath sounds: Normal breath sounds. No stridor. No wheezing, rhonchi or rales.  Chest:     Chest wall: No tenderness.  Musculoskeletal:        General: Normal range of motion.     Cervical back: Normal range of motion and neck supple.  Skin:    General: Skin is warm and dry.     Capillary Refill: Capillary refill takes less than 2 seconds.     Coloration: Skin is not jaundiced or pale.     Findings: Bruising present. No erythema, lesion or rash.  Neurological:     General: No focal deficit present.     Mental Status: She is alert and oriented to person, place, and time. Mental status is at baseline.     Coordination: Coordination  abnormal.     Gait: Gait abnormal.  Psychiatric:        Mood and Affect: Mood normal.        Behavior: Behavior normal.        Thought Content: Thought content normal.        Judgment: Judgment normal.     Results for orders placed or performed in visit on 05/17/20  Microscopic Examination   Urine  Result Value Ref Range   WBC, UA 6-10 (A) 0 - 5 /hpf   RBC 3-10 (A) 0 - 2 /hpf   Epithelial Cells (non renal) 0-10 0 - 10 /hpf   Casts Present None seen /lpf   Cast Type Granular casts (A) N/A   Crystals Present N/A   Crystal Type Calcium Oxalate N/A   Mucus, UA Present Not Estab.   Bacteria, UA Many (A) None seen/Few   Yeast, UA Present None seen  Urine Culture, Reflex   Urine  Result Value Ref Range   Urine Culture, Routine Preliminary report    Organism ID, Bacteria Comment   UA/M w/rflx Culture, Routine   Specimen: Urine   Urine  Result Value Ref Range   Specific Gravity, UA 1.025 1.005 - 1.030   pH, UA 5.0 5.0 - 7.5   Color, UA Yellow Yellow   Appearance Ur Clear Clear   Leukocytes,UA 2+ (A) Negative   Protein,UA Negative Negative/Trace   Glucose, UA Negative Negative   Ketones, UA Negative Negative   RBC, UA Trace (A) Negative   Bilirubin, UA Negative Negative   Urobilinogen, Ur 0.2 0.2 - 1.0 mg/dL   Nitrite, UA Negative Negative   Microscopic Examination See below:    Urinalysis Reflex Comment       Assessment & Plan:   Problem List Items Addressed This Visit    None    Visit Diagnoses    Acute cystitis with hematuria    -  Primary   Did not pick up her cipro. Will pick it up today and start it.    Hallucination       Still concern that this is due to UTI. Will pick up her cipro. If not better by Monday, will start her on seroquel to help with them.    Dizziness       No better. Scheduled for MRI. Has not heard from neurology yet. Will treat UTI- will pick up cipro today. Call with any concerns.    Flu vaccine need       Flu shot given today.     Relevant Orders   Flu Vaccine QUAD High Dose(Fluad) (Completed)  Follow up plan: Return By phone on Monday,.  >30 minutes spent with patient and grandson today.

## 2020-05-25 ENCOUNTER — Encounter: Payer: Self-pay | Admitting: Family Medicine

## 2020-05-26 LAB — UA/M W/RFLX CULTURE, ROUTINE
Bilirubin, UA: NEGATIVE
Glucose, UA: NEGATIVE
Ketones, UA: NEGATIVE
Nitrite, UA: NEGATIVE
Protein,UA: NEGATIVE
Specific Gravity, UA: 1.025 (ref 1.005–1.030)
Urobilinogen, Ur: 0.2 mg/dL (ref 0.2–1.0)
pH, UA: 5 (ref 5.0–7.5)

## 2020-05-26 LAB — MICROSCOPIC EXAMINATION

## 2020-05-26 LAB — URINE CULTURE, REFLEX

## 2020-05-27 ENCOUNTER — Telehealth: Payer: Self-pay | Admitting: Family Medicine

## 2020-05-27 NOTE — Telephone Encounter (Signed)
Called and spoke with patient, advised her of Dr. Durenda Age message. Patient states that she is not going to the ER right now. States she has no ride there and her son cannot take her. Patient states she is doing better since her fall. States she has a beside commode beside her so she does not have to walk far. Patient states that if she is not better by Wednesday, then she will go be seen. Advised patient to please be seen if she gets worse or does not get any better. Patient verbalized understanding.

## 2020-05-27 NOTE — Telephone Encounter (Signed)
Can pt possibly do virtual or r/s when she is better? Please advise  Copied from [place] [id number]. Topic: General - Other >> May 27, 2020  9:51 AM Leward Quan A wrote: Reason for CRM: Patient called to inform Dr Wynetta Emery that after she was seen in the office on 05/24/20 she went home and had a very bad fall. Per patient she hit her face on her desk her hand is swollen and her leg is also swollen, she is all bruised up and she have a hard time walking on her own and even with a walker. Patient stated that she did not seek any medical attention after this fall and is getting a little better but still can't walk well. Patient want to cancel her visit for 05/28/20 because she can not get to the office. Please advise Ph# [phone]

## 2020-05-27 NOTE — Telephone Encounter (Signed)
She needs to go to the ER now

## 2020-05-28 ENCOUNTER — Ambulatory Visit: Payer: Medicare Other | Admitting: Family Medicine

## 2020-05-30 ENCOUNTER — Other Ambulatory Visit: Payer: Self-pay | Admitting: Family Medicine

## 2020-05-30 NOTE — Telephone Encounter (Signed)
Requested  medications are  due for refill today yes  Requested medications are on the active medication list yes  Last refill May 2021  Future visit scheduled 05/31/20 (today)  Notes to clinic Not Delgated

## 2020-05-31 ENCOUNTER — Telehealth: Payer: Self-pay | Admitting: Pharmacist

## 2020-05-31 ENCOUNTER — Telehealth: Payer: Medicare Other

## 2020-05-31 NOTE — Progress Notes (Signed)
  Chronic Care Management   Outreach Note  05/31/2020 Name: YERALDI FIDLER MRN: [id number] DOB: 06-Mar-1946  Referred by: Valerie Roys, DO Reason for referral : No chief complaint on file.   An unsuccessful telephone outreach was attempted today. The patient was referred to the pharmacist for assistance with care management and care coordination.   Follow Up Plan: Will ask care guide to outreach for reschedule.  Junita Push. Kenton Kingfisher PharmD, [facility] [phone]

## 2020-06-03 ENCOUNTER — Ambulatory Visit: Payer: Self-pay

## 2020-06-03 ENCOUNTER — Other Ambulatory Visit: Payer: Self-pay | Admitting: Family Medicine

## 2020-06-03 NOTE — Telephone Encounter (Signed)
Pt has an appt 06/10/20. Please advise.

## 2020-06-03 NOTE — Telephone Encounter (Signed)
Pt. Reports she fell at home 1 week ago. Yesterday noticed swelling to both feet and ankles.Does not think it is related. No pain. Can get both shoes on. No other symptoms. Made soonest appointment with Dr. Wynetta Emery - 06/10/20. If pt. Can be seen sooner, please advise.  Reason for Disposition . [1] MODERATE pain (e.g., interferes with normal activities, limping) AND [2] present > 3 days  Answer Assessment - Initial Assessment Questions 1. LOCATION: "Which ankle is swollen?" "Where is the swelling?"     Both feet 2. ONSET: "When did the swelling start?"     Yesterday 3. SIZE: "How large is the swelling?"     Ankle 4. PAIN: "Is there any pain?" If Yes, ask: "How bad is it?" (Scale 1-10; or mild, moderate, severe)   - NONE (0): no pain.   - MILD (1-3): doesn't interfere with normal activities.    - MODERATE (4-7): interferes with normal activities (e.g., work or school) or awakens from sleep, limping.    - SEVERE (8-10): excruciating pain, unable to do any normal activities, unable to walk.      No 5. CAUSE: "What do you think caused the ankle swelling?"     Fell 1 week ago 6. OTHER SYMPTOMS: "Do you have any other symptoms?" (e.g., fever, chest pain, difficulty breathing, calf pain)     No 7. PREGNANCY: "Is there any chance you are pregnant?" "When was your last menstrual period?"     No  Protocols used: ANKLE SWELLING-A-AH

## 2020-06-05 ENCOUNTER — Ambulatory Visit
Admission: RE | Admit: 2020-06-05 | Discharge: 2020-06-05 | Disposition: A | Discharge status: Home / Self Care | Payer: Medicare Other | Source: Ambulatory Visit | Attending: Family Medicine | Admitting: Family Medicine

## 2020-06-05 ENCOUNTER — Other Ambulatory Visit: Payer: Self-pay

## 2020-06-05 DIAGNOSIS — R443 Hallucinations, unspecified: Secondary | ICD-10-CM | POA: Diagnosis not present

## 2020-06-05 DIAGNOSIS — R296 Repeated falls: Secondary | ICD-10-CM | POA: Diagnosis not present

## 2020-06-05 DIAGNOSIS — R2689 Other abnormalities of gait and mobility: Secondary | ICD-10-CM | POA: Insufficient documentation

## 2020-06-05 DIAGNOSIS — R269 Unspecified abnormalities of gait and mobility: Secondary | ICD-10-CM | POA: Diagnosis not present

## 2020-06-05 DIAGNOSIS — R42 Dizziness and giddiness: Secondary | ICD-10-CM | POA: Diagnosis not present

## 2020-06-05 MED ORDER — GADOBUTROL 1 MMOL/ML IV SOLN
7.0000 mL | Freq: Once | INTRAVENOUS | Status: AC | PRN
  Administered 2020-06-05: 7 mL via INTRAVENOUS

## 2020-06-10 ENCOUNTER — Other Ambulatory Visit: Payer: Self-pay

## 2020-06-10 ENCOUNTER — Ambulatory Visit
Admission: RE | Admit: 2020-06-10 | Discharge: 2020-06-10 | Disposition: A | Discharge status: Home / Self Care | Payer: Medicare Other | Attending: Family Medicine | Admitting: Family Medicine

## 2020-06-10 ENCOUNTER — Ambulatory Visit
Admission: RE | Admit: 2020-06-10 | Discharge: 2020-06-10 | Disposition: A | Discharge status: Home / Self Care | Payer: Medicare Other | Source: Ambulatory Visit | Attending: Family Medicine | Admitting: Family Medicine

## 2020-06-10 ENCOUNTER — Encounter: Payer: Self-pay | Admitting: Family Medicine

## 2020-06-10 ENCOUNTER — Ambulatory Visit (INDEPENDENT_AMBULATORY_CARE_PROVIDER_SITE_OTHER): Payer: Medicare Other | Admitting: Family Medicine

## 2020-06-10 VITALS — BP 94/57 | HR 74 | Resp 16 | Ht 64.0 in | Wt 168.2 lb

## 2020-06-10 DIAGNOSIS — R42 Dizziness and giddiness: Secondary | ICD-10-CM | POA: Diagnosis not present

## 2020-06-10 DIAGNOSIS — R443 Hallucinations, unspecified: Secondary | ICD-10-CM | POA: Diagnosis not present

## 2020-06-10 DIAGNOSIS — M79642 Pain in left hand: Secondary | ICD-10-CM

## 2020-06-10 DIAGNOSIS — I208 Other forms of angina pectoris: Secondary | ICD-10-CM | POA: Diagnosis not present

## 2020-06-10 DIAGNOSIS — M7989 Other specified soft tissue disorders: Secondary | ICD-10-CM | POA: Diagnosis not present

## 2020-06-10 DIAGNOSIS — S62307A Unspecified fracture of fifth metacarpal bone, left hand, initial encounter for closed fracture: Secondary | ICD-10-CM | POA: Diagnosis not present

## 2020-06-10 DIAGNOSIS — I952 Hypotension due to drugs: Secondary | ICD-10-CM

## 2020-06-10 DIAGNOSIS — Z87828 Personal history of other (healed) physical injury and trauma: Secondary | ICD-10-CM | POA: Diagnosis not present

## 2020-06-10 MED ORDER — QUETIAPINE FUMARATE ER 50 MG PO TB24: 50.0000 mg | ORAL_TABLET | Freq: Every day | ORAL | 2 refills | Status: DC

## 2020-06-10 NOTE — Progress Notes (Signed)
BP (!) 94/57   Pulse 74   Resp 16   Ht 5\' 4"  (1.626 m)   Wt 168 lb 3.2 oz (76.3 kg)   SpO2 95%   BMI 28.87 kg/m    Subjective:    [name], female    DOB: [birth year], 75 y.o.   MRN: [id number]  HPI: Sarah White is a 74 y.o. female  Chief Complaint  Patient presents with  . Fall    05/24/2020--result for MRI scan--denies PHQ 9 for today  . Foot Swelling    onset week   After last visit, she fell head first into her desk. Still feeling crummy. She is seeing neurology in about 2 weeks. She continues to feel very dizzy and has fallen. Nothing changes her dizziness. When she fell she hit her L hand and her had has been swollen and tender since then. She noticed some swelling in her legs, but it is all done now. She is otherwise feeling OK with no other concerns or compliants at this time.   Relevant past medical, surgical, family and social history reviewed and updated as indicated. Interim medical history since our last visit reviewed. Allergies and medications reviewed and updated.  Review of Systems  Constitutional: Negative.   Respiratory: Negative.   Musculoskeletal: Positive for myalgias. Negative for arthralgias, back pain, gait problem, joint swelling, neck pain and neck stiffness.  Neurological: Negative.   Psychiatric/Behavioral: Negative.     Per HPI unless specifically indicated above     Objective:    BP (!) 94/57   Pulse 74   Resp 16   Ht 5\' 4"  (1.626 m)   Wt 168 lb 3.2 oz (76.3 kg)   SpO2 95%   BMI 28.87 kg/m   Wt Readings from Last 3 Encounters:  06/10/20 168 lb 3.2 oz (76.3 kg)  05/24/20 168 lb (76.2 kg)  05/17/20 169 lb (76.7 kg)    Physical Exam Vitals and nursing note reviewed.  Constitutional:      General: She is not in acute distress.    Appearance: Normal appearance. She is not ill-appearing, toxic-appearing or diaphoretic.  HENT:     Head: Normocephalic and atraumatic.     Right Ear: External ear normal.     Left  Ear: External ear normal.     Nose: Nose normal.     Mouth/Throat:     Mouth: Mucous membranes are moist.     Pharynx: Oropharynx is clear.  Eyes:     General: No scleral icterus.       Right eye: No discharge.        Left eye: No discharge.     Extraocular Movements: Extraocular movements intact.     Conjunctiva/sclera: Conjunctivae normal.     Pupils: Pupils are equal, round, and reactive to light.  Cardiovascular:     Rate and Rhythm: Normal rate and regular rhythm.     Pulses: Normal pulses.     Heart sounds: Normal heart sounds. No murmur heard.  No friction rub. No gallop.   Pulmonary:     Effort: Pulmonary effort is normal. No respiratory distress.     Breath sounds: Normal breath sounds. No stridor. No wheezing, rhonchi or rales.  Chest:     Chest wall: No tenderness.  Musculoskeletal:        General: Normal range of motion.     Cervical back: Normal range of motion and neck supple.  Skin:    General: Skin  is warm and dry.     Capillary Refill: Capillary refill takes less than 2 seconds.     Coloration: Skin is not jaundiced or pale.     Findings: No bruising, erythema, lesion or rash.  Neurological:     General: No focal deficit present.     Mental Status: She is alert and oriented to person, place, and time. Mental status is at baseline.  Psychiatric:        Mood and Affect: Mood normal.        Behavior: Behavior normal.        Thought Content: Thought content normal.        Judgment: Judgment normal.     Results for orders placed or performed in visit on 05/17/20  Microscopic Examination   Urine  Result Value Ref Range   WBC, UA 6-10 (A) 0 - 5 /hpf   RBC 3-10 (A) 0 - 2 /hpf   Epithelial Cells (non renal) 0-10 0 - 10 /hpf   Casts Present None seen /lpf   Cast Type Granular casts (A) N/A   Crystals Present N/A   Crystal Type Calcium Oxalate N/A   Mucus, UA Present Not Estab.   Bacteria, UA Many (A) None seen/Few   Yeast, UA Present None seen  Urine  Culture, Reflex   Urine  Result Value Ref Range   Urine Culture, Routine Final report    Organism ID, Bacteria Comment   UA/M w/rflx Culture, Routine   Specimen: Urine   Urine  Result Value Ref Range   Specific Gravity, UA 1.025 1.005 - 1.030   pH, UA 5.0 5.0 - 7.5   Color, UA Yellow Yellow   Appearance Ur Clear Clear   Leukocytes,UA 2+ (A) Negative   Protein,UA Negative Negative/Trace   Glucose, UA Negative Negative   Ketones, UA Negative Negative   RBC, UA Trace (A) Negative   Bilirubin, UA Negative Negative   Urobilinogen, Ur 0.2 0.2 - 1.0 mg/dL   Nitrite, UA Negative Negative   Microscopic Examination See below:    Urinalysis Reflex Comment       Assessment & Plan:   Problem List Items Addressed This Visit    None    Visit Diagnoses    Dizziness    -  Primary   Will stop imdur for 1 week and then recheck to see if resolves. Seeing neurology in 2 weeks. Will refer for vestibular rehab. Continue to monitor closely.   Relevant Orders   Ambulatory referral to Physical Therapy   Left hand pain       Swelling and pain on lateral side of the hand following fall. Will get x-ray. Await results.    Relevant Orders   DG Hand Complete Left   Closed nondisplaced fracture of fifth metacarpal bone of left hand, unspecified portion of metacarpal, initial encounter       X-ray shows fracture. Will get her into ortho ASAP. No pain at this time.    Relevant Orders   Ambulatory referral to Orthopedic Surgery   Hallucination       Will start seroquel and recheck 1 week.    Hypotension due to drugs       BP still running low. Stop imdur and we'll recheck 1 week.        Follow up plan: Return in about 1 week (around 06/17/2020).  >40 minutes spent with patient today

## 2020-06-10 NOTE — Telephone Encounter (Signed)
Pharmacy sent a fax regarding patients Sertraline prescription stating that they did not receive the prescription we sent on 05/30/20. Does not say that it was confirmed by the pharmacy in [place]. Can we please resend?

## 2020-06-10 NOTE — Patient Instructions (Addendum)
STOP the isosorbide mononitrate

## 2020-06-11 MED ORDER — SERTRALINE HCL 100 MG PO TABS
200.0000 mg | ORAL_TABLET | Freq: Every day | ORAL | 0 refills | Status: DC
Start: 2020-06-11 — End: 2020-10-03

## 2020-06-11 NOTE — Telephone Encounter (Signed)
Pt has been r/s  

## 2020-06-12 DIAGNOSIS — S62307A Unspecified fracture of fifth metacarpal bone, left hand, initial encounter for closed fracture: Secondary | ICD-10-CM | POA: Diagnosis not present

## 2020-06-17 ENCOUNTER — Telehealth: Payer: Medicare Other

## 2020-06-18 ENCOUNTER — Ambulatory Visit: Payer: Medicare Other | Admitting: Family

## 2020-06-25 ENCOUNTER — Other Ambulatory Visit: Payer: Self-pay | Admitting: Family Medicine

## 2020-06-26 DIAGNOSIS — R413 Other amnesia: Secondary | ICD-10-CM | POA: Diagnosis not present

## 2020-06-26 DIAGNOSIS — R410 Disorientation, unspecified: Secondary | ICD-10-CM | POA: Insufficient documentation

## 2020-06-26 DIAGNOSIS — R42 Dizziness and giddiness: Secondary | ICD-10-CM | POA: Diagnosis not present

## 2020-06-26 DIAGNOSIS — R441 Visual hallucinations: Secondary | ICD-10-CM | POA: Insufficient documentation

## 2020-06-26 DIAGNOSIS — H9193 Unspecified hearing loss, bilateral: Secondary | ICD-10-CM | POA: Diagnosis not present

## 2020-06-26 DIAGNOSIS — H9190 Unspecified hearing loss, unspecified ear: Secondary | ICD-10-CM | POA: Insufficient documentation

## 2020-06-28 ENCOUNTER — Telehealth: Payer: Self-pay | Admitting: Licensed Clinical Social Worker

## 2020-06-28 ENCOUNTER — Encounter: Payer: Self-pay | Admitting: Family Medicine

## 2020-06-28 ENCOUNTER — Telehealth: Payer: Medicare Other

## 2020-06-28 ENCOUNTER — Ambulatory Visit (INDEPENDENT_AMBULATORY_CARE_PROVIDER_SITE_OTHER): Payer: Medicare Other | Admitting: Family Medicine

## 2020-06-28 VITALS — BP 123/66 | HR 75 | Temp 98.4°F | Wt 164.0 lb

## 2020-06-28 DIAGNOSIS — I952 Hypotension due to drugs: Secondary | ICD-10-CM

## 2020-06-28 DIAGNOSIS — R443 Hallucinations, unspecified: Secondary | ICD-10-CM

## 2020-06-28 DIAGNOSIS — R42 Dizziness and giddiness: Secondary | ICD-10-CM

## 2020-06-28 MED ORDER — CARVEDILOL 12.5 MG PO TABS
ORAL_TABLET | ORAL | 0 refills | Status: DC
Start: 2020-06-28 — End: 2020-10-03

## 2020-06-28 NOTE — Progress Notes (Signed)
BP 123/66   Pulse 75   Temp 98.4 F (36.9 C) (Oral)   Wt 164 lb (74.4 kg)   SpO2 97%   BMI 28.15 kg/m    Subjective:    [name], female    DOB: [birth year], 74 y.o.   MRN: [id number]  HPI: Sarah White is a 74 y.o. female  Chief Complaint  Patient presents with  . Dizziness    1 week f/up- pt states she was seen by Neuro 06/26/20 and started on Aricept   Feeling better. She is supposed to start PT on Monday to help with the falls. Started on aricept due to her memory. Since starting the seroquel, she is feeling much better. She has not been having any hallucinations. She is feeling a lot better and is happy with the way things are going. No other concerns. Or complaints at this time.   Relevant past medical, surgical, family and social history reviewed and updated as indicated. Interim medical history since our last visit reviewed. Allergies and medications reviewed and updated.  Review of Systems  Constitutional: Negative.   Respiratory: Negative.   Cardiovascular: Negative.   Gastrointestinal: Negative.   Musculoskeletal: Negative.   Neurological: Positive for weakness. Negative for dizziness, tremors, seizures, syncope, facial asymmetry, speech difficulty, light-headedness, numbness and headaches.  Psychiatric/Behavioral: Negative.  Negative for agitation, behavioral problems, confusion, decreased concentration, dysphoric mood, hallucinations, self-injury, sleep disturbance and suicidal ideas. The patient is not nervous/anxious and is not hyperactive.     Per HPI unless specifically indicated above     Objective:    BP 123/66   Pulse 75   Temp 98.4 F (36.9 C) (Oral)   Wt 164 lb (74.4 kg)   SpO2 97%   BMI 28.15 kg/m   Wt Readings from Last 3 Encounters:  06/28/20 164 lb (74.4 kg)  06/10/20 168 lb 3.2 oz (76.3 kg)  05/24/20 168 lb (76.2 kg)    Physical Exam Vitals and nursing note reviewed.  Pulmonary:     Effort: Pulmonary effort is  normal. No respiratory distress.     Comments: Speaking in full sentences Neurological:     Mental Status: She is alert.  Psychiatric:        Mood and Affect: Mood normal.        Behavior: Behavior normal.        Thought Content: Thought content normal.        Judgment: Judgment normal.     Results for orders placed or performed in visit on 05/17/20  Microscopic Examination   Urine  Result Value Ref Range   WBC, UA 6-10 (A) 0 - 5 /hpf   RBC 3-10 (A) 0 - 2 /hpf   Epithelial Cells (non renal) 0-10 0 - 10 /hpf   Casts Present None seen /lpf   Cast Type Granular casts (A) N/A   Crystals Present N/A   Crystal Type Calcium Oxalate N/A   Mucus, UA Present Not Estab.   Bacteria, UA Many (A) None seen/Few   Yeast, UA Present None seen  Urine Culture, Reflex   Urine  Result Value Ref Range   Urine Culture, Routine Final report    Organism ID, Bacteria Comment   UA/M w/rflx Culture, Routine   Specimen: Urine   Urine  Result Value Ref Range   Specific Gravity, UA 1.025 1.005 - 1.030   pH, UA 5.0 5.0 - 7.5   Color, UA Yellow Yellow   Appearance Ur Clear  Clear   Leukocytes,UA 2+ (A) Negative   Protein,UA Negative Negative/Trace   Glucose, UA Negative Negative   Ketones, UA Negative Negative   RBC, UA Trace (A) Negative   Bilirubin, UA Negative Negative   Urobilinogen, Ur 0.2 0.2 - 1.0 mg/dL   Nitrite, UA Negative Negative   Microscopic Examination See below:    Urinalysis Reflex Comment       Assessment & Plan:   Problem List Items Addressed This Visit    None    Visit Diagnoses    Hallucination    -  Primary   Resolved with seroquel. Doing much better. Continue to monitor.    Hypotension due to drugs       Doing much better with continuedt decrease in her medicine. Continue to monitor. Call with any concerns.    Relevant Medications   carvedilol (COREG) 12.5 MG tablet   Dizziness       Resolved almost entirely. Continue to monitor. Call with any concerns. Conintue  PT as needed. Continue to monitor.        Follow up plan: Return in about 4 weeks (around 07/26/2020) for DM visit.   . This visit was completed via MyChart due to the restrictions of the COVID-19 pandemic. All issues as above were discussed and addressed. Physical exam was done as above through visual confirmation on MyChart. If it was felt that the patient should be evaluated in the office, they were directed there. The patient verbally consented to this visit. . Location of the patient: home . Location of the provider: work . Those involved with this call:  . Provider: Park Liter, DO . CMA: Yvonna Alanis, [place] . Front Desk/Registration: Jill Side  . Time spent on call: 21 minutes on the phone discussing health concerns. 23 minutes total spent in review of patient's record and preparation of their chart.

## 2020-06-28 NOTE — Telephone Encounter (Signed)
Chronic Care Management    Clinical Social Work General Follow Up Note  06/28/2020 Name: Sarah White MRN: [id number] DOB: Mar 24, 1946  Sarah White is a 74 y.o. year old female who is a primary care patient of Valerie Roys, DO. The CCM team was consulted for assistance with Mental Health Counseling and Resources.   Review of patient status, including review of consultants reports, relevant laboratory and other test results, and collaboration with appropriate care team members and the patient's provider was performed as part of comprehensive patient evaluation and provision of chronic care management services.    LCSW completed third and final CCM outreach attempt today but was unable to reach patient successfully. A HIPPA compliant voice message was left encouraging patient to return call once available if she is still interested in our services. LCSW will close referral at this time but patient can be re-referred to program by PCP if needed.   Outpatient Encounter Medications as of 06/28/2020  Medication Sig Note  . albuterol (VENTOLIN HFA) 108 (90 Base) MCG/ACT inhaler Inhale 2 puffs into the lungs every 6 (six) hours as needed for wheezing or shortness of breath. 03/26/2020: Taking 2 puffs BID  . amiodarone (PACERONE) 200 MG tablet Take 100 mg by mouth daily.    Marland Kitchen aspirin EC 81 MG tablet Take 81 mg by mouth at bedtime.    . budesonide-formoterol (SYMBICORT) 160-4.5 MCG/ACT inhaler Inhale 2 puffs into the lungs 2 (two) times daily.   Marland Kitchen buPROPion (WELLBUTRIN SR) 150 MG 12 hr tablet Take 1 tablet (150 mg total) by mouth 2 (two) times daily.   . carvedilol (COREG) 12.5 MG tablet TAKE 2 TABLETS BY MOUTH TWICE DAILY WITH A MEAL   . ciprofloxacin (CIPRO) 500 MG tablet Take 1 tablet (500 mg total) by mouth 2 (two) times daily.   . clopidogrel (PLAVIX) 75 MG tablet Take 1 tablet (75 mg total) by mouth daily at 6 (six) AM.   . cyclobenzaprine (FLEXERIL) 10 MG tablet TAKE 1 TABLET BY MOUTH  THREE TIMES DAILY AS NEEDED FOR MUSCLE SPASMS. DO NOT DRIVE ON THIS [place] MAKE YOU SLEEPY   . diphenhydrAMINE (BENADRYL) 25 MG tablet Take 25 mg by mouth every 6 (six) hours as needed for allergies.  03/26/2020: Taking 1 QPM  . Dulaglutide (TRULICITY) 1.5 GN/0.0BB SOPN Inject 1.5 mg into the skin once a week. (Patient taking differently: Inject 1.5 mg into the skin once a week. Friday)   . furosemide (LASIX) 40 MG tablet Take 20 mg by mouth daily.    . isosorbide mononitrate (IMDUR) 30 MG 24 hr tablet Take 1 tablet (30 mg total) by mouth daily.   . meclizine (ANTIVERT) 12.5 MG tablet Take 1 tablet (12.5 mg total) by mouth 3 (three) times daily as needed for dizziness.   . Multiple Vitamin (MULTIVITAMIN WITH MINERALS) TABS tablet Take 1 tablet by mouth daily.   . nitroGLYCERIN (NITROSTAT) 0.4 MG SL tablet Place 1 tablet (0.4 mg total) under the tongue every 5 (five) minutes as needed for chest pain.   Marland Kitchen ondansetron (ZOFRAN-ODT) 4 MG disintegrating tablet Take 4 mg by mouth every 8 (eight) hours as needed for nausea or vomiting.    Marland Kitchen QUEtiapine (SEROQUEL XR) 50 MG TB24 24 hr tablet Take 1 tablet (50 mg total) by mouth at bedtime.   . rosuvastatin (CRESTOR) 20 MG tablet Take 1 tablet by mouth once daily   . sacubitril-valsartan (ENTRESTO) 49-51 MG Take 1 tablet by mouth 2 (two) times  daily.    . sertraline (ZOLOFT) 100 MG tablet Take 2 tablets (200 mg total) by mouth daily.   . silver sulfADIAZINE (SILVADENE) 1 % cream Apply 1 application topically daily. (Patient taking differently: Apply 1 application topically as needed. )   . SPIRIVA HANDIHALER 18 MCG inhalation capsule PLACE 1 CAPSULE INTO INHALER AND INHALE DAILY   . traZODone (DESYREL) 50 MG tablet TAKE 1/2 TO 1 (ONE-HALF TO ONE) TABLET BY MOUTH AT BEDTIME AS NEEDED FOR SLEEP   . triamcinolone ointment (KENALOG) 0.5 % Apply 1 application topically 2 (two) times daily. (Patient taking differently: Apply 1 application topically as needed.  )    No facility-administered encounter medications on file as of 06/28/2020.    Follow Up Plan: We have been unable to make contact with the patient for follow up. The care management team is available to follow up with the patient after provider conversation with the patient regarding recommendation for care management engagement and subsequent re-referral to the care management team.   Eula Fried, BSW, MSW, [email or web address] Phone: [phone]

## 2020-06-30 ENCOUNTER — Encounter: Payer: Self-pay | Admitting: Family Medicine

## 2020-07-01 ENCOUNTER — Ambulatory Visit: Payer: Medicare Other | Attending: Family Medicine | Admitting: Physical Therapy

## 2020-07-01 ENCOUNTER — Telehealth: Payer: Self-pay

## 2020-07-01 ENCOUNTER — Encounter: Payer: Self-pay | Admitting: Physical Therapy

## 2020-07-01 ENCOUNTER — Other Ambulatory Visit: Payer: Self-pay

## 2020-07-01 VITALS — BP 136/63

## 2020-07-01 DIAGNOSIS — R296 Repeated falls: Secondary | ICD-10-CM | POA: Diagnosis not present

## 2020-07-01 DIAGNOSIS — R42 Dizziness and giddiness: Secondary | ICD-10-CM | POA: Diagnosis not present

## 2020-07-01 DIAGNOSIS — R2681 Unsteadiness on feet: Secondary | ICD-10-CM | POA: Insufficient documentation

## 2020-07-01 DIAGNOSIS — M6281 Muscle weakness (generalized): Secondary | ICD-10-CM | POA: Diagnosis not present

## 2020-07-01 NOTE — Telephone Encounter (Signed)
Called to schedule per Dr Wynetta Emery no answer, left vm Return in about 4 weeks (around 07/26/2020) for DM visit.

## 2020-07-01 NOTE — Therapy (Signed)
[place] MAIN [facility] SERVICES [address] [place], [place], [address] Phone: [phone]   Fax:  [phone]  Physical Therapy Evaluation  Patient Details  Name: Sarah White MRN: [id number] Date of Birth: [birth year] No data recorded  Encounter Date: 07/01/2020   PT End of Session - 07/01/20 1503    Visit Number 1    Number of Visits 17    Date for PT Re-Evaluation 08/26/20    PT Start Time 1504    PT Stop Time 1607    PT Time Calculation (min) 63 min    Equipment Utilized During Treatment Gait belt    Activity Tolerance Patient limited by fatigue    Behavior During Therapy WFL for tasks assessed/performed           Past Medical History:  Diagnosis Date  . Acute pancreatitis 09/09/2018  . Acute respiratory failure with hypoxia and hypercarbia ([place]) 07/22/2015  . Cancer ([place])    uterine  . CHF (congestive heart failure) ([place])   . COPD (chronic obstructive pulmonary disease) ([place])   . Diabetes mellitus without complication ([place])   . Encephalopathy acute 07/22/2015  . Hypertension   . Pneumonia    November 2016  . Pulmonary edema 07/22/2015  . 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 ABDOMINAL HYSTERECTOMY    . CAROTID PTA/STENT INTERVENTION Right 03/06/2020   Procedure: CAROTID PTA/STENT INTERVENTION;  Surgeon: Katha Cabal, MD;  Location: [place] [facility];  Service: Cardiovascular;  Laterality: Right;  . CARPAL TUNNEL RELEASE Bilateral   . CESAREAN SECTION    . CHOLECYSTECTOMY N/A 09/12/2018   Procedure: LAPAROSCOPIC CHOLECYSTECTOMY WITH INTRAOPERATIVE CHOLANGIOGRAM;  Surgeon: Herbert Pun, MD;  Location: [facility] ORS;  Service: General;  Laterality: N/A;  . CORONARY ANGIOPLASTY WITH STENT PLACEMENT    . KNEE SURGERY Left   . LEFT HEART CATH AND CORONARY ANGIOGRAPHY Right 11/26/2016   Procedure: Left Heart Cath and Coronary Angiography;  Surgeon:  Isaias Cowman, MD;  Location: [place] [facility];  Service: Cardiovascular;  Laterality: Right;  . LEFT HEART CATH AND CORONARY ANGIOGRAPHY N/A 01/23/2020   Procedure: LEFT HEART CATH AND CORONARY ANGIOGRAPHY;  Surgeon: Yolonda Kida, MD;  Location: [facility] [place] [facility];  Service: Cardiovascular;  Laterality: N/A;    Vitals:   07/01/20 1548  BP: 136/63       [facility] PT Assessment - 07/01/20 1527      Assessment   Medical Diagnosis dizziness and giddiness      Restrictions   Weight Bearing Restrictions No      Home Environment   Living Environment Private residence    Living Arrangements Children   son Sarah White   Available Help at Discharge Family    Type of [place] to enter    Entrance Stairs-Number of Steps [place] One level    [place] - 2 wheels;Walker - 4 wheels;Bedside commode;Tub bench   hand rails, wheelchair           VESTIBULAR AND BALANCE EVALUATION   HISTORY:  Subjective history of current problem:  History obtained via medical record and patient report.  Patient states that she has been having memory issues and is unable to recall some details of her history.  Patient had new onset dizziness in September 2021. Patient  states she has no sense of balance.  Patient states she starts falling backwards and to her right.  Patient states she does not realize that she is leaning backwards.  Patient reports she has had 5 falls in the past month. Patient reports her son is now standing and walking behind her in the house because they are worried about the patient falling again.  Patient states at times her son helps her to get into bed and straightened in the bed as patient states sometimes she is off center and does not realize it.  Patient denies vertigo.  Patient describes her dizziness as unsteadiness, lightheadedness and falling.  Patient reports she is having dizziness every once in a  while now. Patient states she initially was afraid of falling and was limiting her walking, but states she is starting to increase her walking. 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 She uses a wheelchair for community mobility. Patient states she has not been outside in 3 years other than to go out for doctors appointments. Patient reports walking brings on her dizziness if she tries to walk more than usual around the house and changes in position such as supine to sit and sit to stand bring her on her dizziness.  Patient reports sitting down helps ease her symptoms.  Patient states that her doctors think it is due to her blood pressure so the doctor has changed her blood pressure medications. Patient reports she has only been on this new medication for 3 or 4 days. Patient states her blood pressure was still low this morning. Patient reports she was hallucinating for a few weeks. Patient states the doctor said that this due to her low blood pressure. Patient states she has not had any hallucinations this week, but states she did have one last week.  Patient reports she has COPD and she passed out before the ambulance came to the house and she states she was taken to the hospital and was in the ICU earlier this year. Patient states she has CHF, defibrillation and COPD which were why she was in the hospital.  Patient reports she has been in the hospital 4 times in the past year.   Description of dizziness: unsteadiness, lightheadedness, falling Frequency: Patient reports anytime she starts walking more than she usually does.  Duration: Pt reports until she falls on the floor or until she sits down. Patient reports once she sits down the dizziness stops immediately.  Symptom nature: positional,  intermittent  Progression of symptoms: better except for the balance History of similar episodes: yes- 30 to 40 times and she said the doctor told her it was due to her blood pressure fluctuations.   Falls  (yes/no): yes Number of falls in past 6 months: 6  Auditory complaints (tinnitus, pain, drainage): denies Vision (last eye exam, diplopia, recent changes): denies Current Symptoms: (dysarthria, dysphagia, drop attacks, bowel and bladder changes, recent weight loss/gain)  Review of systems negative for red flags. Patient reports she has COPD and she passed out before the ambulance came to the house and she states she was taken to the hospital and was in the ICU. Patient reports she has been in the hospital 4 times in the past year.    EXAMINATION  POSTURE:  Rounded shoulders, slumped seated posture  SOMATOSENSORY:  deferred      COORDINATION: Finger to Nose: Dysmetric right Past Pointing:  Right     Functional Mobility: Patient required Min A with supine to/from sitting. Patient  able to perform sit to stand with contact-guard assist.  Patient required 2 attempts to perform sit to stand at mat table and was bracing back of legs against table for leverage.  Patient loses balance posteriorly upon standing.   Gait: Patient arrives to clinic in [place].  Patient ambulates short distances of about 15 m with rolling walker however patient picks up rolling walker instead of rolling with contact-guard assist.  Patient ambulates with markedly decreased step length and height and with significantly decreased gait speed.  Patient fatigues quickly with gait requiring seated rest break after performing 10 m walking speed test.  Balance: Patient is challenged by standing with normal base of support.  Patient is unsteady on her feet during transfers and requires assistive device for gait.  Further balance testing to be performed during follow-up sessions.   OCULOMOTOR / VESTIBULAR TESTING:  Oculomotor Exam- Room Light  Normal Abnormal Comments  Ocular Alignment N    Ocular ROM N    Spontaneous Nystagmus N    Gaze evoked Nystagmus N    Smooth Pursuit  Abn Few corrective saccades noted    Saccades  Abn Hypometric saccades noted in left and right fields; patient had difficulty with following commands for testing  VOR    deferred  VOR Cancellation    deferred   Left Head Impulse    deferred  Right Head Impulse    deferred   BPPV TESTS: Deferred testing this date has patient denies vertigo.  FUNCTIONAL OUTCOME MEASURES:  Results Comments  DHI  50/100 Moderate perception of handicap; in need of intervention  ABC Scale  20.6%  high falls risk; in need of intervention  FOTO  40/100 Given the patient's risk adjustment variables, like-patients nationally had a FS score of 51/100 at intake; in need of intervention  10 meter Walking Speed  0.129 M/sec  below average as compared to age and gender normative values; in need of intervention    Patient presents to clinic with reports of 5 falls within the last month.  Patient reports that she has had been having difficulty with her memory and and patient had difficulty recalling some of her past medical history during evaluation session this date.  It might be beneficial for patient's son, Sarah White, to attend follow-up sessions for family training and both patient and Sarah White are in agreement.  Patient reports lightheadedness with positional changes such as supine to sit to standing and with walking longer household distances.  Patient with bilateral lower extremity weakness noted.  Patient with significantly decreased self-selected gait speed with assistive device of 0.12 M/s.  Attempted to get orthostatic vitals this date however machine had difficulty reading the blood pressure upon sitting and upon standing.  In supine patient's blood pressure was 136/63 and in standing after the second attempt to take blood pressure it was 132/54.  We will plan on reassessing orthostatic vital signs at next session.  Patient requires contact-guard assist for transfers and ambulation with assistive device.  Patient scored 20.6% on ABC scale indicating high fall  risk.  Patient scored 40/100 on an FOTO indicating decreased function.  Patient would benefit from skilled PT services to address listed functional deficits and goals as set on plan of care and to try to decrease patient's fall risk.          PT Education - 07/01/20 1824    Education Details discussed plan of care    Person(s) Educated Patient;Child(ren)   son. Sarah White   Methods  Explanation    Comprehension Verbalized understanding            PT Short Term Goals - 07/01/20 1914      PT SHORT TERM GOAL #1   Title Pt will be independent with HEP in order to improve balance and to decrease fall risk and improve function at home.    Time 4    Period Weeks    Status New    Target Date 07/29/20             PT Long Term Goals - 07/01/20 1914      PT LONG TERM GOAL #1   Title Patient will have improved FOTO score of 6 points or greater in order to demonstrate improvements in patient's ADLs and functional performance.    Baseline Scored 40/100 on 07/01/2020;    Time 8    Period Weeks    Status New    Target Date 08/26/20      PT LONG TERM GOAL #2   Title Patient will improve self-selected gait speed to 0.5 m/s or greater on 10 m walk to indicate reduced falls risk, improved household mobility and independence with ADL's.    Baseline 0.129 m/s on 07/01/2020;    Time 8    Period Weeks    Status New    Target Date 08/26/20      PT LONG TERM GOAL #3   Title Patient will reduce falls risk as indicated by Activities Specific Balance Confidence Scale (ABC) >67%.    Baseline Scored 20.6% on 07/01/2020;    Time 8    Period Weeks    Status New    Target Date 08/26/20      PT LONG TERM GOAL #4   Title Pt will increase strength by at least 1/2 MMT grade overall in bilateral lower extremities in order to demonstrate improvement in strength and function.    Baseline Grossly +3/5 to 4/5 left lower extremity and +3/5 to -5/5 right lower extremity on 07/01/2020;    Time 8    Period  Weeks    Status New    Target Date 08/26/20                  Plan - 07/01/20 1827    Clinical Impression Statement Patient presents to clinic with reports of 5 falls within the last month.  Patient reports that she has had been having difficulty with her memory and and patient had difficulty recalling some of her past medical history during evaluation session this date.  It might be beneficial for patient's son, Sarah White, to attend follow-up sessions for family training and both patient and Sarah White are in agreement.  Patient reports lightheadedness with positional changes such as supine to sit to standing and with walking longer household distances.  Patient with bilateral lower extremity weakness noted.  Patient with significantly decreased self-selected gait speed with assistive device of 0.12 M/s.  Attempted to get orthostatic vitals this date however machine had difficulty reading the blood pressure upon sitting and upon standing.  In supine patient's blood pressure was 136/63 and in standing after the second attempt to take blood pressure it was 132/54.  We will plan on reassessing orthostatic vital signs at next session.  Patient requires contact-guard assist for transfers and ambulation with assistive device.  Patient scored 20.6% on ABC scale indicating high fall risk.  Patient scored 40/100 on an FOTO indicating decreased function.  Patient would benefit from skilled PT services  to address listed functional deficits and goals as set on plan of care and to try to decrease patient's fall risk.    Personal Factors and Comorbidities Comorbidity 3+;Time since onset of injury/illness/exacerbation    Comorbidities COPD, DM, CHF, depression, orthostatic hypotension, HTN    Examination-Activity Limitations Bed Mobility;Bathing;Bend;Reach Overhead;Stairs;Stand;Toileting;Transfers    Examination-Participation Restrictions Cleaning    Stability/Clinical Decision Making Evolving/Moderate complexity     Clinical Decision Making Moderate    Rehab Potential Fair    PT Frequency 2x / week    PT Duration 8 weeks    PT Treatment/Interventions Canal[place] Repostioning;Gait training;Stair training;Functional mobility training;Therapeutic activities;Therapeutic exercise;Balance training;Neuromuscular re-education;Patient/family education;Vestibular;Manual techniques;ADLs/Self Care Home Management    PT Next Visit Plan Perform Berg balance test, Repeat orthostatic blood pressure readings    Consulted and Agree with Plan of Care Patient;Family member/caregiver           Patient will benefit from skilled therapeutic intervention in order to improve the following deficits and impairments:  Decreased balance, Decreased activity tolerance, Cardiopulmonary status limiting activity, Decreased endurance, Decreased strength, Pain, Decreased mobility, Difficulty walking, Decreased coordination, Decreased safety awareness, Dizziness, Decreased knowledge of use of DME  Visit Diagnosis: Dizziness and giddiness  Unsteadiness on feet  Muscle weakness (generalized)  Repeated falls     Problem List Patient Active Problem List   Diagnosis Date Noted  . Symptomatic carotid artery narrowing without infarction 03/06/2020  . Symptomatic carotid artery stenosis without infarction 02/04/2020  . Acute respiratory failure with hypoxia and hypercarbia ([place]) 12/03/2019  . Acute on chronic respiratory failure with hypoxia ([place]) 05/15/2019  . Insomnia 05/05/2018  . Squamous cell carcinoma of arm, right 01/07/2017  . Depression, major, single episode, moderate ([place]) 12/01/2016  . Unstable angina ([place]) 11/26/2016  . Near syncope 03/11/2016  . Hypokalemia 03/11/2016  . Orthostatic hypotension 08/12/2015  . Hyperlipidemia 07/29/2015  . Acute systolic CHF (congestive heart failure) ([place]) 07/25/2015  . Hypertensive renal disease 07/25/2015  . Low back pain 07/25/2015  . Essential (primary) hypertension 07/25/2015  .  Smoker 07/22/2015  . COPD (chronic obstructive pulmonary disease) ([place]) 07/22/2015  . Diabetes mellitus with chronic kidney disease ([place]) 07/22/2015  . CAD (coronary artery disease) 07/22/2015  . Type 2 diabetes mellitus with hyperglycemia ([facility]) 07/22/2015   Sarah White PT, DPT [phone] Sarah White 07/01/2020, 7:26 PM  [place] MAIN [facility] SERVICES [address] [place], [place], [address] Phone: [phone]   Fax:  [phone]  Name: Sarah White MRN: [id number] Date of Birth: 23-Apr-1946

## 2020-07-03 ENCOUNTER — Ambulatory Visit: Payer: Medicare Other

## 2020-07-08 ENCOUNTER — Ambulatory Visit: Payer: Medicare Other

## 2020-07-08 ENCOUNTER — Encounter: Payer: Medicare Other | Admitting: Physical Therapy

## 2020-07-09 ENCOUNTER — Telehealth: Payer: Self-pay | Admitting: General Practice

## 2020-07-09 ENCOUNTER — Telehealth: Payer: Medicare Other | Admitting: General Practice

## 2020-07-09 ENCOUNTER — Ambulatory Visit: Payer: Self-pay | Admitting: General Practice

## 2020-07-09 DIAGNOSIS — R443 Hallucinations, unspecified: Secondary | ICD-10-CM

## 2020-07-09 DIAGNOSIS — E1165 Type 2 diabetes mellitus with hyperglycemia: Secondary | ICD-10-CM

## 2020-07-09 DIAGNOSIS — F419 Anxiety disorder, unspecified: Secondary | ICD-10-CM

## 2020-07-09 DIAGNOSIS — I951 Orthostatic hypotension: Secondary | ICD-10-CM

## 2020-07-09 DIAGNOSIS — J449 Chronic obstructive pulmonary disease, unspecified: Secondary | ICD-10-CM

## 2020-07-09 DIAGNOSIS — E782 Mixed hyperlipidemia: Secondary | ICD-10-CM

## 2020-07-09 DIAGNOSIS — R42 Dizziness and giddiness: Secondary | ICD-10-CM

## 2020-07-09 DIAGNOSIS — I5021 Acute systolic (congestive) heart failure: Secondary | ICD-10-CM

## 2020-07-09 DIAGNOSIS — I2510 Atherosclerotic heart disease of native coronary artery without angina pectoris: Secondary | ICD-10-CM

## 2020-07-09 DIAGNOSIS — R2689 Other abnormalities of gait and mobility: Secondary | ICD-10-CM

## 2020-07-09 DIAGNOSIS — F321 Major depressive disorder, single episode, moderate: Secondary | ICD-10-CM

## 2020-07-09 NOTE — Patient Instructions (Signed)
Visit Information  Goals Addressed              This Visit's Progress   .  RNCM: Pt- "I have had 2 bad falls recently" (pt-stated)        CARE PLAN ENTRY (see longitudinal plan of care for additional care plan information)  Current Barriers:  Marland Kitchen Knowledge Deficits related to fall precautions in patient with multiple chronic conditions including HF, orthostatic hypotension, CAD, DM, and depression . Decreased adherence to prescribed treatment for fall prevention . Lacks caregiver support.   Clinical Goal(s):  Marland Kitchen Over the next 120days, patient will demonstrate improved adherence to prescribed treatment plan for decreasing falls as evidenced by patient reporting and review of EMR . Over the next  120 days, patient will verbalize using fall risk reduction strategies discussed . Over the next 120 days, patient will not experience additional falls . Over the next 120 days, patient will verbalize understanding of plan for reducing fall events in her home setting . Over the next 120 days, patient will demonstrate a decrease in falls  exacerbations as evidenced by proper use of DME and wearing shoes or non-slip socks when ambulating . Over the next 120 days, patient will attend all scheduled medical appointments: next appointment with pcp on 07-30-2020 at 4:20 pm  Interventions:  . Provided written and verbal education re: Potential causes of falls and Fall prevention strategies . Reviewed medications and discussed potential side effects of medications such as dizziness and frequent urination.  The patient has had changes in her medication and her blood pressures are better but still low. The patient has been taking and recording but could not tell RNCM most recent readings.  . Assessed for s/s of orthostatic hypotension.  The patient has a history of orthostatic hypotension. The patient does not remember any events before the last fall.  . Assessed for falls since last encounter. The patient  states after her MD visit on 06-28-2020 she had a bad fall and was on the floor for 1 hour before her son found her. She hit her head and threw up several times. She did not go to be evaluated at the ER.  The patient fell again on 07-07-2020 and her son Lennette Bihari was at home. She does not remember what happened that caused the fall. She hit her head again but did not throw up. She did not go and get evaluated. Education and assessment done.  . Assessed patients knowledge of fall risk prevention secondary to previously provided education. . Assessed working status of life alert bracelet and patient adherence.  The patient does not have a life alert system. The patient states her son is there most of the time. Will get information for the patient on life alert systems.  . Provided patient information for fall alert systems.  Discussed options and ask the patient to consider a life alert system.  . Advised patient to let the pcp know of any new falls and seek emergent care for evaluation and treatment of falls . Provided education to patient re: use of good fitting shoes or skid proof socks. The patient states she has some skid proof socks somewhere.  . Reviewed scheduled/upcoming provider appointments including: 07-30-2020 . Pharmacy referral for medication review, help with paperwork and high risk medications that may increase falls.  Patient Self Care Activities:  . Utilize walker (assistive device) appropriately with all ambulation . De-clutter walkways . Change positions slowly . Wear secure fitting shoes at all times  with ambulation . Utilize home lighting for dim lit areas . Have self and pet awareness at all times  Plan: . CCM RN CM will follow up in 30 to 60   Initial goal documentation     .  RNCM: Pt-"They said it happened because of the fluid around my heart" (pt-stated)        CARE PLAN ENTRY (see longtitudinal plan of care for additional care plan information)  Current Barriers:   . Chronic Disease Management support, education, and care coordination needs related to CHF, CAD, HLD, COPD, DMII, Anxiety, and Depression  Clinical Goal(s) related to CHF, CAD, HLD, COPD, DMII, Anxiety, and Depression:  Over the next 120 days, patient will:  . Work with the care management team to address educational, disease management, and care coordination needs  . Begin or continue self health monitoring activities as directed today Measure and record cbg (blood glucose) 3 times weekly or more if taking steroids, Measure and record blood pressure 5 times per week, Measure and record weight daily, and adhere to a heart healthy/ADA diet . Call provider office for new or worsened signs and symptoms Blood glucose findings outside established parameters, Blood pressure findings outside established parameters, Weight outside established parameters, Oxygen saturation lower than established parameter, Chest pain, Shortness of breath, and New or worsened symptom related to depression and other chronic conditions . Call care management team with questions or concerns . Verbalize basic understanding of patient centered plan of care established today  Interventions related to CHF, CAD, HLD, COPD, DMII, Anxiety, and Depression:  . Evaluation of current treatment plans and patient's adherence to plan as established by provider. The patient had a heart catheterization on 01/23/2020.  She is doing well. Has blockages but states she is not going to elect to have surgery. The patient verbalized she goes back to see Dr. Clayborn Bigness on 01-31-2020 and will discuss treatment options.   She had called the office yesterday because her leg was numb.  They instructed her to "walk it out".  The patient denies any new issues with numbness of leg today.  The patient was getting ready to call the office today because she was reading her instructions on caring for the insertion site and was confused. The discharge instructions AVS was  pulled up and instructions provided to the patient on her level of understanding. The patient states she understands how to care for the site now. The patient states the gauze is dry and intact. Review of signs and symptoms of infection and to call Dr. Clayborn Bigness for changes in site or worsening condition. 03-20-2020: The patient had a right carotid PTA with stent placement on 03-06-2020.  The patient was in the hospital for 3 days. She is doing well and has no new concerns at this time. No s/s of infection at the site. Says her headaches have not gone away but feels it is due to arthritis. The patient follows up with Dr. Clayborn Bigness in August. 07-09-2020: The patient is having orthostatic hypotension. Has been taking blood pressure but could not give numbers.  Education on keeping a record and bringing to MD visit on 07-30-2020 . Assessed patient understanding of disease states.  The patient has a good understanding of her chronic conditions. The patient states she did not have changes in her weight at home. Review of her normal heart failure signs and symptoms. The patient has not weighed today but her weight is staying around 176.  The patient usually waits until  later in the morning to weigh because of her balance first thing in the am. 07-09-2020: the patient states that her wt is up and down but not ever < or > 2 pounds. The patient knows to call the provider for weight changes > or < than parameters set by the provider.  . Assessed patient's education and care coordination needs.  The patient has several upcoming appointments but has them all written down. The patient has to see a vein specialist because she has bilateral blockages in her carotid arteries. The patient will make an appointment to see the specialist.  The patient has a follow up appointment with Dr. Clayborn Bigness on 01-31-2020.  03-20-2020: The patient states that she does not have to have the left side done that it is okay. She has had no problems with the  right side and is recovering well post procedure. 07-09-2020: The patient ask for help with filling out paperwork from [place]. She ask if the [facility] could talk to the pharmacist and let her know she needs assistance. Will send in basket message to pharm D for assistance with patients request.  . Provided disease specific education to patient.  Education on watching sodium content of foods. The patient verbalized she is doing a better job at this. The patient has only been checking her blood sugars every 3 days. The patient had not checked her blood sugar this am but yesterday before her heart catheterization it was 94.  Education on s/s of hypoglycemia/hyperglycemia and checking more frequently if she feels differently.  The patient blood pressure is stable. She is weighing daily and also she is taking her blood pressure regularly and recording. 03-20-2020: The patient verbalized her blood sugars are ranging 110-140.  The patient says when she was in the hospital that she had some low readings and they had to give her orange juice. 07-09-2020: Denies any issues with blood sugars. Denies hypoglycemia. Will continue to monitor.  Nash Dimmer with appropriate clinical care team members regarding patient needs.  Will refer LCSW for assistance with stressors and ways to relieve anxiety to reduce the incidence of smoking. Also the patient feels she is being a burden to her son. She denies suicidal ideation but says he has put his life on hold because of her. Education and support given today.  On going support and education from pharmacy for questions/concerns related to new medications. Will continue to monitor for changes.  . Review of upcoming appointments: Sees Dr. Wynetta Emery on 07-30-2020 for follow up after medication changes.  . CCM team numbers and roles discussed with the patient and she agrees to work with the team.   Patient Self Care Activities related to CHF, CAD, HLD, COPD, DMII, Anxiety, and Depression:   . Patient is unable to independently self-manage chronic health conditions  Please see past updates related to this goal by clicking on the "Past Updates" button in the selected goal         Patient verbalizes understanding of instructions provided today.   Telephone follow up appointment with care management team member scheduled for: 09-03-2020 at 3:30 pm  Noreene Larsson RN, MSN, [facility] Mobile: [phone]

## 2020-07-09 NOTE — Telephone Encounter (Signed)
°  Chronic Care Management   Note  07/09/2020 Name: MONICKA CYRAN MRN: [id number] DOB: May 02, 1946  Incoming call from the patient. Call completed. See new encounter.   Follow up plan: Telephone follow up appointment with care management team member scheduled for: 09-03-2020 at 3:30 pm  [place], MSN, [facility] Mobile: [phone]

## 2020-07-09 NOTE — Chronic Care Management (AMB) (Signed)
Chronic Care Management   Follow Up Note   07/09/2020 Name: Sarah White MRN: [id number] DOB: 1946-01-18  Referred by: Valerie Roys, DO Reason for referral : Chronic Care Management (RNCM follow up for Chronic Disease Management and care coordination needs)   Sarah White is a 74 y.o. year old female who is a primary care patient of Valerie Roys, DO. The CCM team was consulted for assistance with chronic disease management and care coordination needs.    Review of patient status, including review of consultants reports, relevant laboratory and other test results, and collaboration with appropriate care team members and the patient's provider was performed as part of comprehensive patient evaluation and provision of chronic care management services.    SDOH (Social Determinants of Health) assessments performed: Yes See Care Plan activities for detailed interventions related to [facility])     Outpatient Encounter Medications as of 07/09/2020  Medication Sig Note  . albuterol (VENTOLIN HFA) 108 (90 Base) MCG/ACT inhaler Inhale 2 puffs into the lungs every 6 (six) hours as needed for wheezing or shortness of breath. 03/26/2020: Taking 2 puffs BID  . amiodarone (PACERONE) 200 MG tablet Take 100 mg by mouth daily.    Marland Kitchen aspirin EC 81 MG tablet Take 81 mg by mouth at bedtime.    . budesonide-formoterol (SYMBICORT) 160-4.5 MCG/ACT inhaler Inhale 2 puffs into the lungs 2 (two) times daily.   Marland Kitchen buPROPion (WELLBUTRIN SR) 150 MG 12 hr tablet Take 1 tablet (150 mg total) by mouth 2 (two) times daily.   . carvedilol (COREG) 12.5 MG tablet 1 tab in the AM and 2 tabs at night   . clopidogrel (PLAVIX) 75 MG tablet Take 1 tablet (75 mg total) by mouth daily at 6 (six) AM.   . cyclobenzaprine (FLEXERIL) 10 MG tablet TAKE 1 TABLET BY MOUTH THREE TIMES DAILY AS NEEDED FOR MUSCLE SPASMS. DO NOT DRIVE ON THIS [place] MAKE YOU SLEEPY   . diphenhydrAMINE (BENADRYL) 25 MG tablet Take 25 mg by mouth  every 6 (six) hours as needed for allergies.  03/26/2020: Taking 1 QPM  . donepezil (ARICEPT) 5 MG tablet Take 5 mg by mouth daily.   . Dulaglutide (TRULICITY) 1.5 GM/0.1UU SOPN Inject 1.5 mg into the skin once a week. (Patient taking differently: Inject 1.5 mg into the skin once a week. Friday)   . furosemide (LASIX) 40 MG tablet Take 20 mg by mouth daily.    . meclizine (ANTIVERT) 12.5 MG tablet Take 1 tablet (12.5 mg total) by mouth 3 (three) times daily as needed for dizziness.   . Multiple Vitamin (MULTIVITAMIN WITH MINERALS) TABS tablet Take 1 tablet by mouth daily.   . nitroGLYCERIN (NITROSTAT) 0.4 MG SL tablet Place 1 tablet (0.4 mg total) under the tongue every 5 (five) minutes as needed for chest pain.   Marland Kitchen ondansetron (ZOFRAN-ODT) 4 MG disintegrating tablet Take 4 mg by mouth every 8 (eight) hours as needed for nausea or vomiting.    Marland Kitchen QUEtiapine (SEROQUEL XR) 50 MG TB24 24 hr tablet Take 1 tablet (50 mg total) by mouth at bedtime.   . rosuvastatin (CRESTOR) 20 MG tablet Take 1 tablet by mouth once daily   . sacubitril-valsartan (ENTRESTO) 49-51 MG Take 1 tablet by mouth 2 (two) times daily.    . sertraline (ZOLOFT) 100 MG tablet Take 2 tablets (200 mg total) by mouth daily.   . silver sulfADIAZINE (SILVADENE) 1 % cream Apply 1 application topically daily. (Patient taking differently: Apply  1 application topically as needed. )   . SPIRIVA HANDIHALER 18 MCG inhalation capsule PLACE 1 CAPSULE INTO INHALER AND INHALE DAILY   . traZODone (DESYREL) 50 MG tablet TAKE 1/2 TO 1 (ONE-HALF TO ONE) TABLET BY MOUTH AT BEDTIME AS NEEDED FOR SLEEP   . triamcinolone ointment (KENALOG) 0.5 % Apply 1 application topically 2 (two) times daily. (Patient taking differently: Apply 1 application topically as needed. )    No facility-administered encounter medications on file as of 07/09/2020.     Objective:   BP Readings from Last 3 Encounters:  07/01/20 136/63  06/28/20 123/66  06/10/20 (!) 94/57    Goals Addressed              This Visit's Progress   .  RNCM: Pt- "I have had 2 bad falls recently" (pt-stated)        CARE PLAN ENTRY (see longitudinal plan of care for additional care plan information)  Current Barriers:  Marland Kitchen Knowledge Deficits related to fall precautions in patient with multiple chronic conditions including HF, orthostatic hypotension, CAD, DM, and depression . Decreased adherence to prescribed treatment for fall prevention . Lacks caregiver support.   Clinical Goal(s):  Marland Kitchen Over the next 120days, patient will demonstrate improved adherence to prescribed treatment plan for decreasing falls as evidenced by patient reporting and review of EMR . Over the next  120 days, patient will verbalize using fall risk reduction strategies discussed . Over the next 120 days, patient will not experience additional falls . Over the next 120 days, patient will verbalize understanding of plan for reducing fall events in her home setting . Over the next 120 days, patient will demonstrate a decrease in falls  exacerbations as evidenced by proper use of DME and wearing shoes or non-slip socks when ambulating . Over the next 120 days, patient will attend all scheduled medical appointments: next appointment with pcp on 07-30-2020 at 4:20 pm  Interventions:  . Provided written and verbal education re: Potential causes of falls and Fall prevention strategies . Reviewed medications and discussed potential side effects of medications such as dizziness and frequent urination.  The patient has had changes in her medication and her blood pressures are better but still low. The patient has been taking and recording but could not tell RNCM most recent readings.  . Assessed for s/s of orthostatic hypotension.  The patient has a history of orthostatic hypotension. The patient does not remember any events before the last fall.  . Assessed for falls since last encounter. The patient states after her MD  visit on 06-28-2020 she had a bad fall and was on the floor for 1 hour before her son found her. She hit her head and threw up several times. She did not go to be evaluated at the ER.  The patient fell again on 07-07-2020 and her son Lennette Bihari was at home. She does not remember what happened that caused the fall. She hit her head again but did not throw up. She did not go and get evaluated. Education and assessment done.  . Assessed patients knowledge of fall risk prevention secondary to previously provided education. . Assessed working status of life alert bracelet and patient adherence.  The patient does not have a life alert system. The patient states her son is there most of the time. Will get information for the patient on life alert systems.  . Provided patient information for fall alert systems.  Discussed options and ask the patient to  consider a life alert system.  . Advised patient to let the pcp know of any new falls and seek emergent care for evaluation and treatment of falls . Provided education to patient re: use of good fitting shoes or skid proof socks. The patient states she has some skid proof socks somewhere.  . Reviewed scheduled/upcoming provider appointments including: 07-30-2020 . Pharmacy referral for medication review, help with paperwork and high risk medications that may increase falls.  Patient Self Care Activities:  . Utilize walker (assistive device) appropriately with all ambulation . De-clutter walkways . Change positions slowly . Wear secure fitting shoes at all times with ambulation . Utilize home lighting for dim lit areas . Have self and pet awareness at all times  Plan: . CCM RN CM will follow up in 30 to 60   Initial goal documentation     .  RNCM: Pt-"They said it happened because of the fluid around my heart" (pt-stated)        CARE PLAN ENTRY (see longtitudinal plan of care for additional care plan information)  Current Barriers:  . Chronic Disease  Management support, education, and care coordination needs related to CHF, CAD, HLD, COPD, DMII, Anxiety, and Depression  Clinical Goal(s) related to CHF, CAD, HLD, COPD, DMII, Anxiety, and Depression:  Over the next 120 days, patient will:  . Work with the care management team to address educational, disease management, and care coordination needs  . Begin or continue self health monitoring activities as directed today Measure and record cbg (blood glucose) 3 times weekly or more if taking steroids, Measure and record blood pressure 5 times per week, Measure and record weight daily, and adhere to a heart healthy/ADA diet . Call provider office for new or worsened signs and symptoms Blood glucose findings outside established parameters, Blood pressure findings outside established parameters, Weight outside established parameters, Oxygen saturation lower than established parameter, Chest pain, Shortness of breath, and New or worsened symptom related to depression and other chronic conditions . Call care management team with questions or concerns . Verbalize basic understanding of patient centered plan of care established today  Interventions related to CHF, CAD, HLD, COPD, DMII, Anxiety, and Depression:  . Evaluation of current treatment plans and patient's adherence to plan as established by provider. The patient had a heart catheterization on 01/23/2020.  She is doing well. Has blockages but states she is not going to elect to have surgery. The patient verbalized she goes back to see Dr. Clayborn Bigness on 01-31-2020 and will discuss treatment options.   She had called the office yesterday because her leg was numb.  They instructed her to "walk it out".  The patient denies any new issues with numbness of leg today.  The patient was getting ready to call the office today because she was reading her instructions on caring for the insertion site and was confused. The discharge instructions AVS was pulled up and  instructions provided to the patient on her level of understanding. The patient states she understands how to care for the site now. The patient states the gauze is dry and intact. Review of signs and symptoms of infection and to call Dr. Clayborn Bigness for changes in site or worsening condition. 03-20-2020: The patient had a right carotid PTA with stent placement on 03-06-2020.  The patient was in the hospital for 3 days. She is doing well and has no new concerns at this time. No s/s of infection at the site. Says her headaches have  not gone away but feels it is due to arthritis. The patient follows up with Dr. Clayborn Bigness in August. 07-09-2020: The patient is having orthostatic hypotension. Has been taking blood pressure but could not give numbers.  Education on keeping a record and bringing to MD visit on 07-30-2020 . Assessed patient understanding of disease states.  The patient has a good understanding of her chronic conditions. The patient states she did not have changes in her weight at home. Review of her normal heart failure signs and symptoms. The patient has not weighed today but her weight is staying around 176.  The patient usually waits until later in the morning to weigh because of her balance first thing in the am. 07-09-2020: the patient states that her wt is up and down but not ever < or > 2 pounds. The patient knows to call the provider for weight changes > or < than parameters set by the provider.  . Assessed patient's education and care coordination needs.  The patient has several upcoming appointments but has them all written down. The patient has to see a vein specialist because she has bilateral blockages in her carotid arteries. The patient will make an appointment to see the specialist.  The patient has a follow up appointment with Dr. Clayborn Bigness on 01-31-2020.  03-20-2020: The patient states that she does not have to have the left side done that it is okay. She has had no problems with the right side and  is recovering well post procedure. 07-09-2020: The patient ask for help with filling out paperwork from [place]. She ask if the [facility] could talk to the pharmacist and let her know she needs assistance. Will send in basket message to pharm D for assistance with patients request.  . Provided disease specific education to patient.  Education on watching sodium content of foods. The patient verbalized she is doing a better job at this. The patient has only been checking her blood sugars every 3 days. The patient had not checked her blood sugar this am but yesterday before her heart catheterization it was 94.  Education on s/s of hypoglycemia/hyperglycemia and checking more frequently if she feels differently.  The patient blood pressure is stable. She is weighing daily and also she is taking her blood pressure regularly and recording. 03-20-2020: The patient verbalized her blood sugars are ranging 110-140.  The patient says when she was in the hospital that she had some low readings and they had to give her orange juice. 07-09-2020: Denies any issues with blood sugars. Denies hypoglycemia. Will continue to monitor.  Nash Dimmer with appropriate clinical care team members regarding patient needs.  Will refer LCSW for assistance with stressors and ways to relieve anxiety to reduce the incidence of smoking. Also the patient feels she is being a burden to her son. She denies suicidal ideation but says he has put his life on hold because of her. Education and support given today.  On going support and education from pharmacy for questions/concerns related to new medications. Will continue to monitor for changes.  . Review of upcoming appointments: Sees Dr. Wynetta Emery on 07-30-2020 for follow up after medication changes.  . CCM team numbers and roles discussed with the patient and she agrees to work with the team.   Patient Self Care Activities related to CHF, CAD, HLD, COPD, DMII, Anxiety, and Depression:  . Patient is  unable to independently self-manage chronic health conditions  Please see past updates related to this goal by  clicking on the "Past Updates" button in the selected goal          Plan:   Telephone follow up appointment with care management team member scheduled for: 09-03-2020 at 3:30 pm   Isabela, MSN, [facility] Mobile: [phone]

## 2020-07-10 ENCOUNTER — Telehealth: Payer: Medicare Other | Admitting: Pharmacist

## 2020-07-10 NOTE — Chronic Care Management (AMB) (Unsigned)
Chronic Care Management Pharmacy  Name: Sarah White  MRN: [id number] DOB: 1945/10/11   Chief Complaint/ HPI  Sarah White,  74 y.o. , female presents for their Follow-Up CCM visit with the clinical pharmacist via telephone.  PCP : Valerie Roys, DO [name], [name] Yolonda Kida, MD as Consulting Physician (Cardiology) Yolonda Kida, MD as Consulting Physician (Cardiology) Vanita Ingles, RN as Case Manager ([place]) Vladimir Faster, [facility] (Pharmacist)  Their chronic conditions include: Hypertension, Hyperlipidemia, Diabetes, Heart Failure, Coronary Artery Disease, COPD, Depression and Tobacco use   Office Visits: ***  Consult Visit: ***  Allergies  Allergen Reactions  . Amlodipine Swelling  . Atorvastatin Other (See Comments)    myalgia  . Codeine Nausea And Vomiting  . Hctz [Hydrochlorothiazide] Other (See Comments)    Hypokalemia  . Lisinopril Other (See Comments)    Dizziness   . Metformin And Related Nausea Only    Nausea, dizziness    Medications: Outpatient Encounter Medications as of 07/10/2020  Medication Sig Note  . albuterol (VENTOLIN HFA) 108 (90 Base) MCG/ACT inhaler Inhale 2 puffs into the lungs every 6 (six) hours as needed for wheezing or shortness of breath. 03/26/2020: Taking 2 puffs BID  . amiodarone (PACERONE) 200 MG tablet Take 100 mg by mouth daily.    Marland Kitchen aspirin EC 81 MG tablet Take 81 mg by mouth at bedtime.    . budesonide-formoterol (SYMBICORT) 160-4.5 MCG/ACT inhaler Inhale 2 puffs into the lungs 2 (two) times daily.   Marland Kitchen buPROPion (WELLBUTRIN SR) 150 MG 12 hr tablet Take 1 tablet (150 mg total) by mouth 2 (two) times daily.   . carvedilol (COREG) 12.5 MG tablet 1 tab in the AM and 2 tabs at night   . clopidogrel (PLAVIX) 75 MG tablet Take 1 tablet (75 mg total) by mouth daily at 6 (six) AM.   . cyclobenzaprine (FLEXERIL) 10 MG tablet TAKE 1 TABLET BY MOUTH  THREE TIMES DAILY AS NEEDED FOR MUSCLE SPASMS. DO NOT DRIVE ON THIS [place] MAKE YOU SLEEPY   . diphenhydrAMINE (BENADRYL) 25 MG tablet Take 25 mg by mouth every 6 (six) hours as needed for allergies.  03/26/2020: Taking 1 QPM  . donepezil (ARICEPT) 5 MG tablet Take 5 mg by mouth daily.   . Dulaglutide (TRULICITY) 1.5 DV/7.6HY SOPN Inject 1.5 mg into the skin once a week. (Patient taking differently: Inject 1.5 mg into the skin once a week. Friday)   . furosemide (LASIX) 40 MG tablet Take 20 mg by mouth daily.    . meclizine (ANTIVERT) 12.5 MG tablet Take 1 tablet (12.5 mg total) by mouth 3 (three) times daily as needed for dizziness.   . Multiple Vitamin (MULTIVITAMIN WITH MINERALS) TABS tablet Take 1 tablet by mouth daily.   . nitroGLYCERIN (NITROSTAT) 0.4 MG SL tablet Place 1 tablet (0.4 mg total) under the tongue every 5 (five) minutes as needed for chest pain.   Marland Kitchen ondansetron (ZOFRAN-ODT) 4 MG disintegrating tablet Take 4 mg by mouth every 8 (eight) hours as needed for nausea or vomiting.    Marland Kitchen QUEtiapine (SEROQUEL XR) 50 MG TB24 24 hr tablet Take 1 tablet (50 mg total) by mouth at bedtime.   . rosuvastatin (CRESTOR) 20 MG tablet Take 1 tablet by mouth once daily   . sacubitril-valsartan (ENTRESTO) 49-51 MG Take 1 tablet by mouth 2 (two) times daily.    . sertraline (ZOLOFT)  100 MG tablet Take 2 tablets (200 mg total) by mouth daily.   . silver sulfADIAZINE (SILVADENE) 1 % cream Apply 1 application topically daily. (Patient taking differently: Apply 1 application topically as needed. )   . SPIRIVA HANDIHALER 18 MCG inhalation capsule PLACE 1 CAPSULE INTO INHALER AND INHALE DAILY   . traZODone (DESYREL) 50 MG tablet TAKE 1/2 TO 1 (ONE-HALF TO ONE) TABLET BY MOUTH AT BEDTIME AS NEEDED FOR SLEEP   . triamcinolone ointment (KENALOG) 0.5 % Apply 1 application topically 2 (two) times daily. (Patient taking differently: Apply 1 application topically as needed. )    No facility-administered  encounter medications on file as of 07/10/2020.    Wt Readings from Last 3 Encounters:  06/28/20 164 lb (74.4 kg)  06/10/20 168 lb 3.2 oz (76.3 kg)  05/24/20 168 lb (76.2 kg)    Current Diagnosis/Assessment:    Goals Addressed   None    Diabetes   A1c goal <7%  Recent Relevant Labs: Lab Results  Component Value Date/Time   HGBA1C 5.6 04/18/2020 04:22 PM   HGBA1C 5.8 01/18/2020 03:11 PM   HGBA1C 10.8 (H) 01/15/2014 04:08 PM   MICROALBUR 10 08/22/2019 01:08 PM   MICROALBUR 10 02/15/2019 03:00 PM    Last diabetic Eye exam:  Lab Results  Component Value Date/Time   HMDIABEYEEXA No Retinopathy 09/10/2015 01:16 PM    Last diabetic Foot exam: No results found for: HMDIABFOOTEX   Checking BG: Daily  Recent FBG Readings: 121 yesterday  Patient has failed these meds in past: NA Patient is currently controlled on the following medications: .   We discussed: Patient reports not checking her BG today as she hasn't been up very long. Se  Plan  Continue {CHL HP Upstream Pharmacy Plans:[phone]}  Hypertension   BP goal is:  {CHL HP UPSTREAM Pharmacist BP ranges:[phone]}  Office blood pressures are  BP Readings from Last 3 Encounters:  07/01/20 136/63  06/28/20 123/66  06/10/20 (!) 94/57   Patient checks BP at home {CHL HP BP Monitoring Frequency:[phone]} Patient home BP readings are ranging: ***  Patient has failed these meds in the past: *** Patient is currently {CHL Controlled/Uncontrolled:[phone]} on the following medications:  . ***  We discussed {CHL HP Upstream Pharmacy discussion:[id number]}  Plan  Continue {CHL HP Upstream Pharmacy Plans:[phone]}       Heart Failure   Type: {type of heart failure:30421350}  Last ejection fraction: *** NYHA Class: {CHL HP Upstream Pharm NYHA Class:[phone]} AHA HF Stage: {CHL HP Upstream Pharm AHA HF Stage:[phone]}  Patient has failed these meds in past: *** Patient is currently {CHL  Controlled/Uncontrolled:[phone]} on the following medications: ***  We discussed weighing daily; if you gain more than 3 pounds in one day or 5 pounds in one week call your doctor  Plan  Continue {CHL HP Upstream Pharmacy DSKAJ:[id number]}    Medication Management   Pt uses *** pharmacy for all medications Uses pill box? {Yes or If no, why not?:20788} Pt endorses ***% compliance  We discussed: {Pharmacy options:24294}  Plan  {US Pharmacy BTDH:74163}    Follow up: *** month phone visit  ***

## 2020-07-11 ENCOUNTER — Other Ambulatory Visit (INDEPENDENT_AMBULATORY_CARE_PROVIDER_SITE_OTHER): Payer: Self-pay | Admitting: Nurse Practitioner

## 2020-07-11 ENCOUNTER — Ambulatory Visit: Payer: Medicare Other

## 2020-07-11 DIAGNOSIS — I6523 Occlusion and stenosis of bilateral carotid arteries: Secondary | ICD-10-CM

## 2020-07-15 ENCOUNTER — Encounter (INDEPENDENT_AMBULATORY_CARE_PROVIDER_SITE_OTHER): Payer: Medicare Other

## 2020-07-15 ENCOUNTER — Encounter (INDEPENDENT_AMBULATORY_CARE_PROVIDER_SITE_OTHER): Payer: Self-pay

## 2020-07-15 ENCOUNTER — Ambulatory Visit (INDEPENDENT_AMBULATORY_CARE_PROVIDER_SITE_OTHER): Payer: Medicare Other | Admitting: Vascular Surgery

## 2020-07-16 ENCOUNTER — Ambulatory Visit: Payer: Medicare Other | Admitting: Family

## 2020-07-16 ENCOUNTER — Other Ambulatory Visit: Payer: Self-pay | Admitting: Family Medicine

## 2020-07-16 NOTE — Telephone Encounter (Signed)
  Notes to clinic Not Delegated  

## 2020-07-17 ENCOUNTER — Ambulatory Visit: Payer: Medicare Other

## 2020-07-22 ENCOUNTER — Encounter: Payer: Medicare Other | Admitting: Physical Therapy

## 2020-07-23 ENCOUNTER — Ambulatory Visit: Payer: Medicare Other

## 2020-07-27 ENCOUNTER — Other Ambulatory Visit: Payer: Self-pay | Admitting: Family Medicine

## 2020-07-27 NOTE — Telephone Encounter (Signed)
Requested Prescriptions  Pending Prescriptions Disp Refills  . buPROPion (WELLBUTRIN SR) 150 MG 12 hr tablet [Pharmacy Med Name: buPROPion HCl ER (SR) 150 MG Oral Tablet Extended Release 12 Hour] 60 tablet 0    Sig: Take 1 tablet by mouth twice daily     Psychiatry: Antidepressants - bupropion Passed - 07/27/2020  4:03 PM      Passed - Completed PHQ-2 or PHQ-9 in the last 360 days      Passed - Last BP in normal range    BP Readings from Last 1 Encounters:  07/01/20 136/63         Passed - Valid encounter within last 6 months    Recent Outpatient Visits          4 weeks ago [place], [place], DO   1 month ago Dizziness   [place], [place], DO   2 months ago Acute cystitis with hematuria   [place], [place], DO   2 months ago Dizziness   [place], DO   3 months ago Mixed hyperlipidemia   [place], [place], DO      Future Appointments            In 3 days Wynetta Emery, Barb Merino, DO [employer], [place]   In 9 months  [employer], [place]

## 2020-07-29 ENCOUNTER — Encounter: Payer: Medicare Other | Admitting: Physical Therapy

## 2020-07-30 ENCOUNTER — Ambulatory Visit: Payer: Medicare Other | Admitting: Family Medicine

## 2020-07-30 DIAGNOSIS — H9193 Unspecified hearing loss, bilateral: Secondary | ICD-10-CM | POA: Diagnosis not present

## 2020-07-30 DIAGNOSIS — R410 Disorientation, unspecified: Secondary | ICD-10-CM | POA: Diagnosis not present

## 2020-07-30 DIAGNOSIS — R42 Dizziness and giddiness: Secondary | ICD-10-CM | POA: Diagnosis not present

## 2020-07-30 DIAGNOSIS — R413 Other amnesia: Secondary | ICD-10-CM | POA: Diagnosis not present

## 2020-07-30 DIAGNOSIS — R441 Visual hallucinations: Secondary | ICD-10-CM | POA: Diagnosis not present

## 2020-07-30 DIAGNOSIS — R296 Repeated falls: Secondary | ICD-10-CM | POA: Insufficient documentation

## 2020-07-31 ENCOUNTER — Telehealth: Payer: Self-pay | Admitting: Pharmacist

## 2020-07-31 NOTE — Progress Notes (Signed)
Chronic Care Management Pharmacy Assistant   Name: Sarah White  MRN: [id number] DOB: January 30, 1946  Reason for Encounter: Patient Assistance Application   PCP : Sarah Roys, DO  Allergies:   Allergies  Allergen Reactions  . Amlodipine Swelling  . Atorvastatin Other (See Comments)    myalgia  . Codeine Nausea And Vomiting  . Hctz [Hydrochlorothiazide] Other (See Comments)    Hypokalemia  . Lisinopril Other (See Comments)    Dizziness   . Metformin And Related Nausea Only    Nausea, dizziness    Medications: Outpatient Encounter Medications as of 07/31/2020  Medication Sig Note  . albuterol (VENTOLIN HFA) 108 (90 Base) MCG/ACT inhaler Inhale 2 puffs into the lungs every 6 (six) hours as needed for wheezing or shortness of breath. 03/26/2020: Taking 2 puffs BID  . amiodarone (PACERONE) 200 MG tablet Take 100 mg by mouth daily.    Marland Kitchen aspirin EC 81 MG tablet Take 81 mg by mouth at bedtime.    . budesonide-formoterol (SYMBICORT) 160-4.5 MCG/ACT inhaler Inhale 2 puffs into the lungs 2 (two) times daily.   Marland Kitchen buPROPion (WELLBUTRIN SR) 150 MG 12 hr tablet Take 1 tablet by mouth twice daily   . carvedilol (COREG) 12.5 MG tablet 1 tab in the AM and 2 tabs at night   . clopidogrel (PLAVIX) 75 MG tablet Take 1 tablet (75 mg total) by mouth daily at 6 (six) AM.   . cyclobenzaprine (FLEXERIL) 10 MG tablet TAKE 1 TABLET BY MOUTH THREE TIMES DAILY AS NEEDED FOR  MUSCLE  SPASMS. DO NOT DRIVE ON THIS MEDICATION. WILL MAKE YOU SLEEPY.   . diphenhydrAMINE (BENADRYL) 25 MG tablet Take 25 mg by mouth every 6 (six) hours as needed for allergies.  03/26/2020: Taking 1 QPM  . donepezil (ARICEPT) 5 MG tablet Take 5 mg by mouth daily.   . Dulaglutide (TRULICITY) 1.5 LH/7.3SK SOPN Inject 1.5 mg into the skin once a week. (Patient taking differently: Inject 1.5 mg into the skin once a week. Friday)   . furosemide (LASIX) 40 MG tablet Take 20 mg by mouth daily.    . meclizine (ANTIVERT) 12.5 MG tablet  Take 1 tablet (12.5 mg total) by mouth 3 (three) times daily as needed for dizziness.   . Multiple Vitamin (MULTIVITAMIN WITH MINERALS) TABS tablet Take 1 tablet by mouth daily.   . nitroGLYCERIN (NITROSTAT) 0.4 MG SL tablet Place 1 tablet (0.4 mg total) under the tongue every 5 (five) minutes as needed for chest pain.   Marland Kitchen ondansetron (ZOFRAN-ODT) 4 MG disintegrating tablet Take 4 mg by mouth every 8 (eight) hours as needed for nausea or vomiting.    Marland Kitchen QUEtiapine (SEROQUEL XR) 50 MG TB24 24 hr tablet Take 1 tablet (50 mg total) by mouth at bedtime.   . rosuvastatin (CRESTOR) 20 MG tablet Take 1 tablet by mouth once daily   . sacubitril-valsartan (ENTRESTO) 49-51 MG Take 1 tablet by mouth 2 (two) times daily.    . sertraline (ZOLOFT) 100 MG tablet Take 2 tablets (200 mg total) by mouth daily.   . silver sulfADIAZINE (SILVADENE) 1 % cream Apply 1 application topically daily. (Patient taking differently: Apply 1 application topically as needed. )   . SPIRIVA HANDIHALER 18 MCG inhalation capsule PLACE 1 CAPSULE INTO INHALER AND INHALE DAILY   . traZODone (DESYREL) 50 MG tablet TAKE 1/2 TO 1 (ONE-HALF TO ONE) TABLET BY MOUTH AT BEDTIME AS NEEDED FOR SLEEP   . triamcinolone ointment (KENALOG) 0.5 %  Apply 1 application topically 2 (two) times daily. (Patient taking differently: Apply 1 application topically as needed. )    No facility-administered encounter medications on file as of 07/31/2020.    Current Diagnosis: Patient Active Problem List   Diagnosis Date Noted  . Symptomatic carotid artery narrowing without infarction 03/06/2020  . Symptomatic carotid artery stenosis without infarction 02/04/2020  . Acute respiratory failure with hypoxia and hypercarbia ([place]) 12/03/2019  . Acute on chronic respiratory failure with hypoxia ([place]) 05/15/2019  . Insomnia 05/05/2018  . Squamous cell carcinoma of arm, right 01/07/2017  . Depression, major, single episode, moderate ([place]) 12/01/2016  . Unstable  angina ([place]) 11/26/2016  . Near syncope 03/11/2016  . Hypokalemia 03/11/2016  . Orthostatic hypotension 08/12/2015  . Hyperlipidemia 07/29/2015  . Acute systolic CHF (congestive heart failure) ([place]) 07/25/2015  . Hypertensive renal disease 07/25/2015  . Low back pain 07/25/2015  . Essential (primary) hypertension 07/25/2015  . Smoker 07/22/2015  . COPD (chronic obstructive pulmonary disease) ([place]) 07/22/2015  . Diabetes mellitus with chronic kidney disease ([place]) 07/22/2015  . CAD (coronary artery disease) 07/22/2015  . Type 2 diabetes mellitus with hyperglycemia ([place]) 07/22/2015    Goals Addressed   None    07/31/2020: The patient called explaining she had a scheduled visit with Sarah Liter, DO on 08-01-20. The patient wanted to confirm that her 3 patient assistance applications that were needing her signature would be available tomorrow for signature. Reassured patient I would confirm her applications were upfront waiting for her signature. Called [facility] where the front desk representative was able to confirm that the patient's applications were upfront awaiting the patient's signature. Called the patient back and confirmed with her that her patient assistance applications were ready and waiting for her at the front desk. Patient verbalized understanding to all information given.   Cloretta Ned, LPN Clinical Pharmacist Assistant  [phone]    Follow-Up:  Patient Assistance Coordination

## 2020-08-01 ENCOUNTER — Ambulatory Visit: Payer: Medicare Other

## 2020-08-01 ENCOUNTER — Ambulatory Visit: Payer: Medicare Other | Admitting: Family Medicine

## 2020-08-01 ENCOUNTER — Ambulatory Visit: Payer: Self-pay | Admitting: *Deleted

## 2020-08-01 NOTE — Telephone Encounter (Signed)
Patient is calling to cancel appointment for today- she reports she fell today. Patient states her feet are not moving like they should and she can just be standing with her walker and fall. Patient states she just scraped her arm today- she is on blood thinners and advised patient needs to be seen. Patient declines and has been rescheduled.  Patient states she is weak and she was supposed to have physical therapy but she fell before she could start. Patient sounds very frustrated with her condition- she states she has to have help to get out of bed. Patient advised to call if she has bruising that does not get better- told patient I would make PCP aware.  Reason for Disposition . [1] MODERATE weakness (i.e., interferes with work, school, normal activities) AND [2] new-onset or worsening  Answer Assessment - Initial Assessment Questions 1. MECHANISM: "How did the fall happen?"     Falling because she is having trouble walking correctly 2. DOMESTIC VIOLENCE AND ELDER ABUSE SCREENING: "Did you fall because someone pushed you or tried to hurt you?" If Yes, ask: "Are you safe now?"     no 3. ONSET: "When did the fall happen?" (e.g., minutes, hours, or days ago)     today 4. LOCATION: "What part of the body hit the ground?" (e.g., back, buttocks, head, hips, knees, hands, head, stomach)     Hurt left arm- scrapes, back is sore 5. INJURY: "Did you hurt (injure) yourself when you fell?" If Yes, ask: "What did you injure? Tell me more about this?" (e.g., body area; type of injury; pain severity)"     Scraped arm on floor 6. PAIN: "Is there any pain?" If Yes, ask: "How bad is the pain?" (e.g., Scale 1-10; or mild,  moderate, severe)   - NONE (0): no pain   - MILD (1-3): doesn't interfere with normal activities    - MODERATE (4-7): interferes with normal activities or awakens from sleep    - SEVERE (8-10): excruciating pain, unable to do any normal activities      Back is sore from last fall 7. SIZE:  For cuts, bruises, or swelling, ask: "How large is it?" (e.g., inches or centimeters)      Small scrapes 8. PREGNANCY: "Is there any chance you are pregnant?" "When was your last menstrual period?"     n/a 9. OTHER SYMPTOMS: "Do you have any other symptoms?" (e.g., dizziness, fever, weakness; new onset or worsening).      weakness 10. CAUSE: "What do you think caused the fall (or falling)?" (e.g., tripped, dizzy spell)       Feet not moving well- she has fallen 3 times in the last 3 weeks- tripping or just falling  Protocols used: FALLS AND FALLING-A-AH

## 2020-08-05 ENCOUNTER — Encounter (INDEPENDENT_AMBULATORY_CARE_PROVIDER_SITE_OTHER): Payer: Self-pay | Admitting: Vascular Surgery

## 2020-08-05 ENCOUNTER — Ambulatory Visit (INDEPENDENT_AMBULATORY_CARE_PROVIDER_SITE_OTHER): Payer: Medicare Other | Admitting: Vascular Surgery

## 2020-08-05 ENCOUNTER — Other Ambulatory Visit: Payer: Self-pay

## 2020-08-05 ENCOUNTER — Ambulatory Visit (INDEPENDENT_AMBULATORY_CARE_PROVIDER_SITE_OTHER): Payer: Medicare Other

## 2020-08-05 VITALS — BP 128/66 | HR 73 | Resp 16 | Wt 156.8 lb

## 2020-08-05 DIAGNOSIS — J449 Chronic obstructive pulmonary disease, unspecified: Secondary | ICD-10-CM | POA: Diagnosis not present

## 2020-08-05 DIAGNOSIS — E782 Mixed hyperlipidemia: Secondary | ICD-10-CM | POA: Diagnosis not present

## 2020-08-05 DIAGNOSIS — I208 Other forms of angina pectoris: Secondary | ICD-10-CM | POA: Diagnosis not present

## 2020-08-05 DIAGNOSIS — I10 Essential (primary) hypertension: Secondary | ICD-10-CM | POA: Diagnosis not present

## 2020-08-05 DIAGNOSIS — I6523 Occlusion and stenosis of bilateral carotid arteries: Secondary | ICD-10-CM

## 2020-08-05 DIAGNOSIS — I2510 Atherosclerotic heart disease of native coronary artery without angina pectoris: Secondary | ICD-10-CM | POA: Diagnosis not present

## 2020-08-05 NOTE — Progress Notes (Signed)
MRN : [id number]  Sarah White is a 74 y.o. (1945-12-13) female who presents with chief complaint of  Chief Complaint  Patient presents with  . Follow-up    81month ultrasound follow up  .  History of Present Illness:   The patient is seen for follow up evaluation of carotid stenosis status post placement of a 9 x 7 x 40 exact stent with the use of the NAV-6 embolic protection device in the right internal carotid artery on 03/06/2020.    There were no post operative problems or complications related to the stent.  The patient denies neck or groin pain.  She did stay and extra 24 hours for c/o nausea.  The patient denies interval amaurosis fugax. There is no recent history of TIA symptoms or focal motor deficits. There is no prior documented CVA.  The patient denies headache.  The patient is taking enteric-coated aspirin 81 mg daily.  The patient has a history of coronary artery disease, no recent episodes of angina or shortness of breath. The patient denies PAD or claudication symptoms. There is a history of hyperlipidemia which is being treated with a statin.    Current Meds  Medication Sig  . albuterol (VENTOLIN HFA) 108 (90 Base) MCG/ACT inhaler Inhale 2 puffs into the lungs every 6 (six) hours as needed for wheezing or shortness of breath.  Marland Kitchen amiodarone (PACERONE) 200 MG tablet Take 100 mg by mouth daily.   Marland Kitchen aspirin EC 81 MG tablet Take 81 mg by mouth at bedtime.   . budesonide-formoterol (SYMBICORT) 160-4.5 MCG/ACT inhaler Inhale 2 puffs into the lungs 2 (two) times daily.  Marland Kitchen buPROPion (WELLBUTRIN SR) 150 MG 12 hr tablet Take 1 tablet by mouth twice daily  . carvedilol (COREG) 12.5 MG tablet 1 tab in the AM and 2 tabs at night  . clopidogrel (PLAVIX) 75 MG tablet Take 1 tablet (75 mg total) by mouth daily at 6 (six) AM.  . cyclobenzaprine (FLEXERIL) 10 MG tablet TAKE 1 TABLET BY MOUTH THREE TIMES DAILY AS NEEDED FOR  MUSCLE  SPASMS. DO NOT DRIVE ON THIS MEDICATION. WILL  MAKE YOU SLEEPY.  . diphenhydrAMINE (BENADRYL) 25 MG tablet Take 25 mg by mouth every 6 (six) hours as needed for allergies.   Marland Kitchen donepezil (ARICEPT) 5 MG tablet Take 5 mg by mouth daily.  . Dulaglutide (TRULICITY) 1.5 MW/1.0UV SOPN Inject 1.5 mg into the skin once a week. (Patient taking differently: Inject 1.5 mg into the skin once a week. Friday)  . furosemide (LASIX) 40 MG tablet Take 20 mg by mouth daily.   . meclizine (ANTIVERT) 12.5 MG tablet Take 1 tablet (12.5 mg total) by mouth 3 (three) times daily as needed for dizziness.  . Multiple Vitamin (MULTIVITAMIN WITH MINERALS) TABS tablet Take 1 tablet by mouth daily.  . nitroGLYCERIN (NITROSTAT) 0.4 MG SL tablet Place 1 tablet (0.4 mg total) under the tongue every 5 (five) minutes as needed for chest pain.  Marland Kitchen ondansetron (ZOFRAN-ODT) 4 MG disintegrating tablet Take 4 mg by mouth every 8 (eight) hours as needed for nausea or vomiting.   Marland Kitchen QUEtiapine (SEROQUEL XR) 50 MG TB24 24 hr tablet Take 1 tablet (50 mg total) by mouth at bedtime.  . rosuvastatin (CRESTOR) 20 MG tablet Take 1 tablet by mouth once daily  . sacubitril-valsartan (ENTRESTO) 49-51 MG Take 1 tablet by mouth 2 (two) times daily.   . sertraline (ZOLOFT) 100 MG tablet Take 2 tablets (200 mg total) by mouth  daily.  . silver sulfADIAZINE (SILVADENE) 1 % cream Apply 1 application topically daily. (Patient taking differently: Apply 1 application topically as needed. )  . SPIRIVA HANDIHALER 18 MCG inhalation capsule PLACE 1 CAPSULE INTO INHALER AND INHALE DAILY  . traZODone (DESYREL) 50 MG tablet TAKE 1/2 TO 1 (ONE-HALF TO ONE) TABLET BY MOUTH AT BEDTIME AS NEEDED FOR SLEEP  . triamcinolone ointment (KENALOG) 0.5 % Apply 1 application topically 2 (two) times daily. (Patient taking differently: Apply 1 application topically as needed. )    Past Medical History:  Diagnosis Date  . Acute pancreatitis 09/09/2018  . Acute respiratory failure with hypoxia and hypercarbia ([place]) 07/22/2015    . Cancer ([place])    uterine  . CHF (congestive heart failure) ([place])   . COPD (chronic obstructive pulmonary disease) ([place])   . Diabetes mellitus without complication ([place])   . Encephalopathy acute 07/22/2015  . Hypertension   . Pneumonia    November 2016  . Pulmonary edema 07/22/2015  . 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 ABDOMINAL HYSTERECTOMY    . CAROTID PTA/STENT INTERVENTION Right 03/06/2020   Procedure: CAROTID PTA/STENT INTERVENTION;  Surgeon: Katha Cabal, MD;  Location: [facility];  Service: Cardiovascular;  Laterality: Right;  . CARPAL TUNNEL RELEASE Bilateral   . CESAREAN SECTION    . CHOLECYSTECTOMY N/A 09/12/2018   Procedure: LAPAROSCOPIC CHOLECYSTECTOMY WITH INTRAOPERATIVE CHOLANGIOGRAM;  Surgeon: Herbert Pun, MD;  Location: [facility];  Service: General;  Laterality: N/A;  . CORONARY ANGIOPLASTY WITH STENT PLACEMENT    . KNEE SURGERY Left   . LEFT HEART CATH AND CORONARY ANGIOGRAPHY Right 11/26/2016   Procedure: Left Heart Cath and Coronary Angiography;  Surgeon: Isaias Cowman, MD;  Location: [facility];  Service: Cardiovascular;  Laterality: Right;  . LEFT HEART CATH AND CORONARY ANGIOGRAPHY N/A 01/23/2020   Procedure: LEFT HEART CATH AND CORONARY ANGIOGRAPHY;  Surgeon: Yolonda Kida, MD;  Location: [facility];  Service: Cardiovascular;  Laterality: N/A;    Social History Social History   Tobacco Use  . Smoking status: Current Every Day Smoker    Packs/day: 1.00    Types: Cigarettes  . Smokeless tobacco: Never Used  Vaping Use  . Vaping Use: Never used  Substance Use Topics  . Alcohol use: No  . Drug use: No    Family History Family History  Problem Relation Age of Onset  . Heart disease Mother     Allergies  Allergen Reactions  . Amlodipine Swelling  . Atorvastatin Other (See Comments)    myalgia  . Codeine Nausea And Vomiting   . Hctz [Hydrochlorothiazide] Other (See Comments)    Hypokalemia  . Lisinopril Other (See Comments)    Dizziness   . Metformin And Related Nausea Only    Nausea, dizziness     REVIEW OF SYSTEMS (Negative unless checked)  Constitutional: [] Weight loss  [] Fever  [] Chills Cardiac: [] Chest pain   [] Chest pressure   [] Palpitations   [] Shortness of breath when laying flat   [] Shortness of breath with exertion. Vascular:  [] Pain in legs with walking   [] Pain in legs at rest  [] History of DVT   [] Phlebitis   [] Swelling in legs   [] Varicose veins   [] Non-healing ulcers Pulmonary:   [] Uses home oxygen   [] Productive cough   [] Hemoptysis   [] Wheeze  [] COPD   [] Asthma Neurologic:  [] Dizziness   []   Seizures   [] History of stroke   [] History of TIA  [] Aphasia   [] Vissual changes   [] Weakness or numbness in arm   [] Weakness or numbness in leg Musculoskeletal:   [] Joint swelling   [] Joint pain   [] Low back pain Hematologic:  [] Easy bruising  [] Easy bleeding   [] Hypercoagulable state   [] Anemic Gastrointestinal:  [] Diarrhea   [] Vomiting  [] Gastroesophageal reflux/heartburn   [] Difficulty swallowing. Genitourinary:  [] Chronic kidney disease   [] Difficult urination  [] Frequent urination   [] Blood in urine Skin:  [] Rashes   [] Ulcers  Psychological:  [] History of anxiety   []  History of major depression.  Physical Examination  Vitals:   08/05/20 1534  BP: 128/66  Pulse: 73  Resp: 16  Weight: 156 lb 12.8 oz (71.1 kg)   Body mass index is 26.91 kg/m. Gen: WD/WN, NAD Head: [place]/AT, No temporalis wasting.  Ear/Nose/Throat: Hearing grossly intact, nares w/o erythema or drainage Eyes: PER, EOMI, sclera nonicteric.  Neck: Supple, no large masses.   Pulmonary:  Good air movement, no audible wheezing bilaterally, no use of accessory muscles.  Cardiac: RRR, no JVD Vascular:  No carotid bruits noted Vessel Right Left  Radial Palpable Palpable  Carotid Palpable Palpable  Gastrointestinal: Non-distended.  No guarding/no peritoneal signs.  Musculoskeletal: M/S 5/5 throughout.  No deformity or atrophy.  Neurologic: CN 2-12 intact. Symmetrical.  Speech is fluent. Motor exam as listed above. Psychiatric: Judgment intact, Mood & affect appropriate for pt's clinical situation. Dermatologic: No rashes or ulcers noted.  No changes consistent with cellulitis.   CBC Lab Results  Component Value Date   WBC 4.6 03/08/2020   HGB 11.1 (L) 03/08/2020   HCT 35.3 (L) 03/08/2020   MCV 87.4 03/08/2020   PLT 108 (L) 03/08/2020    BMET    Component Value Date/Time   NA 138 04/18/2020 1623   NA 134 (L) 01/15/2014 1556   K 4.3 04/18/2020 1623   K 4.2 01/15/2014 1556   CL 102 04/18/2020 1623   CL 102 01/15/2014 1556   CO2 26 04/18/2020 1623   CO2 26 01/15/2014 1556   GLUCOSE 95 04/18/2020 1623   GLUCOSE 118 (H) 03/08/2020 0505   GLUCOSE 270 (H) 01/15/2014 1556   BUN 14 04/18/2020 1623   BUN 11 01/15/2014 1556   CREATININE 0.96 04/18/2020 1623   CREATININE 0.52 (L) 01/15/2014 1556   CALCIUM 8.6 (L) 04/18/2020 1623   CALCIUM 9.2 01/15/2014 1556   GFRNONAA 60 04/18/2020 1623   GFRNONAA >60 01/15/2014 1556   GFRAA 69 04/18/2020 1623   GFRAA >60 01/15/2014 1556   CrCl cannot be calculated (Patient's most recent lab result is older than the maximum 21 days allowed.).  COAG Lab Results  Component Value Date   INR 1.06 11/26/2016   INR 1.06 11/26/2016   INR 0.9 01/15/2014    Radiology No results found.   Assessment/Plan 1. Symptomatic stenosis of both carotid arteries without infarction Recommend:  The patient is s/p successful right carotid stent  Duplex ultrasound preoperatively shows 40-59% contralateral stenosis.  Continue antiplatelet therapy as prescribed Continue management of CAD, HTN and Hyperlipidemia Healthy heart diet,  encouraged exercise at least 4 times per week  Follow up in 6 months with duplex ultrasound and physical exam based on the patient's carotid surgery and  50% stenosis of the left carotid artery.  - VAS US CAROTID; Future  2. Essential (primary) hypertension Continue antihypertensive medications as already ordered, these medications have been reviewed and there are no  changes at this time.   3. Coronary artery disease involving native coronary artery of native heart without angina pectoris Continue cardiac and antihypertensive medications as already ordered and reviewed, no changes at this time.  Continue statin as ordered and reviewed, no changes at this time  Nitrates PRN for chest pain   4. Chronic obstructive pulmonary disease, unspecified COPD type ([place]) Continue pulmonary medications and aerosols as already ordered, these medications have been reviewed and there are no changes at this time.    5. Mixed hyperlipidemia Continue statin as ordered and reviewed, no changes at this time    Hortencia Pilar, MD  08/05/2020 3:37 PM

## 2020-08-06 ENCOUNTER — Ambulatory Visit: Payer: Medicare Other

## 2020-08-07 ENCOUNTER — Encounter: Payer: Self-pay | Admitting: Family Medicine

## 2020-08-07 ENCOUNTER — Ambulatory Visit (INDEPENDENT_AMBULATORY_CARE_PROVIDER_SITE_OTHER): Payer: Medicare Other | Admitting: Family Medicine

## 2020-08-07 ENCOUNTER — Other Ambulatory Visit: Payer: Self-pay

## 2020-08-07 VITALS — BP 161/89 | HR 78 | Temp 97.7°F | Wt 156.6 lb

## 2020-08-07 DIAGNOSIS — E1165 Type 2 diabetes mellitus with hyperglycemia: Secondary | ICD-10-CM

## 2020-08-07 DIAGNOSIS — R8281 Pyuria: Secondary | ICD-10-CM | POA: Diagnosis not present

## 2020-08-07 DIAGNOSIS — R112 Nausea with vomiting, unspecified: Secondary | ICD-10-CM

## 2020-08-07 DIAGNOSIS — N3000 Acute cystitis without hematuria: Secondary | ICD-10-CM | POA: Diagnosis not present

## 2020-08-07 DIAGNOSIS — I208 Other forms of angina pectoris: Secondary | ICD-10-CM

## 2020-08-07 LAB — URINALYSIS, ROUTINE W REFLEX MICROSCOPIC
Bilirubin, UA: NEGATIVE
Glucose, UA: NEGATIVE
Nitrite, UA: NEGATIVE
RBC, UA: NEGATIVE
Specific Gravity, UA: 1.015 (ref 1.005–1.030)
Urobilinogen, Ur: 1 mg/dL (ref 0.2–1.0)
pH, UA: 6.5 (ref 5.0–7.5)

## 2020-08-07 LAB — MICROSCOPIC EXAMINATION: RBC, Urine: NONE SEEN /hpf (ref 0–2)

## 2020-08-07 LAB — MICROALBUMIN, URINE WAIVED
Creatinine, Urine Waived: 100 mg/dL (ref 10–300)
Microalb, Ur Waived: 80 mg/L — ABNORMAL HIGH (ref 0–19)

## 2020-08-07 LAB — BAYER DCA HB A1C WAIVED: HB A1C (BAYER DCA - WAIVED): 5.1 % (ref <7.0)

## 2020-08-07 MED ORDER — NITROFURANTOIN MONOHYD MACRO 100 MG PO CAPS: 100.0000 mg | ORAL_CAPSULE | Freq: Two times a day (BID) | ORAL | 0 refills | Status: DC

## 2020-08-07 MED ORDER — ONDANSETRON 4 MG PO TBDP: 4.0000 mg | ORAL_TABLET | Freq: Three times a day (TID) | ORAL | 6 refills | Status: DC | PRN

## 2020-08-07 NOTE — Progress Notes (Signed)
BP (!) 161/89   Pulse 78   Temp 97.7 F (36.5 C)   Wt 156 lb 9.6 oz (71 kg)   SpO2 98%   BMI 26.88 kg/m    Subjective:    [name], female    DOB: 05-Oct-1945, 74 y.o.   MRN: [id number]  HPI: Sarah White is a 74 y.o. female  Chief Complaint  Patient presents with  . Diabetes  . Emesis    pt states she has been vomitting for 3 days, has an appetite but can't keep anything down   . Fatigue    pt states she feels really tired, states she feels very weak and can't walk very far.   . Fall    pt states she falls a lot feels very weak, pt fell about 2 days ago and hurt her sides and back    Has been vomiting x3 days. No fevers. No sick contacts. HAs been a little dizzy first thing in the AM. BP has been very labile  Has been having issues with balance. Did not go to PT. Refuses home PT  DIABETES Hypoglycemic episodes:yes Polydipsia/polyuria: yes Visual disturbance: no Chest pain: no Paresthesias: no Glucose Monitoring: no  Accucheck frequency: Not Checking Taking Insulin?: no Blood Pressure Monitoring: a few times a day Retinal Examination: Not up to Date Foot Exam: Up to Date Diabetic Education: Completed Pneumovax: Up to Date Influenza: Up to Date Aspirin: yes   Relevant past medical, surgical, family and social history reviewed and updated as indicated. Interim medical history since our last visit reviewed. Allergies and medications reviewed and updated.  Review of Systems  Constitutional: Positive for fatigue. Negative for activity change, appetite change, chills, diaphoresis, fever and unexpected weight change.  HENT: Negative.   Respiratory: Negative.   Cardiovascular: Negative.   Gastrointestinal: Positive for nausea. Negative for abdominal distention, abdominal pain, anal bleeding, blood in stool, constipation, diarrhea, rectal pain and vomiting.  Musculoskeletal: Negative.   Skin: Negative.   Neurological: Positive for dizziness,  weakness and light-headedness. Negative for tremors, seizures, syncope, facial asymmetry, speech difficulty, numbness and headaches.  Psychiatric/Behavioral: Negative.     Per HPI unless specifically indicated above     Objective:    BP (!) 161/89   Pulse 78   Temp 97.7 F (36.5 C)   Wt 156 lb 9.6 oz (71 kg)   SpO2 98%   BMI 26.88 kg/m   Wt Readings from Last 3 Encounters:  08/07/20 156 lb 9.6 oz (71 kg)  08/05/20 156 lb 12.8 oz (71.1 kg)  06/28/20 164 lb (74.4 kg)    Physical Exam Vitals and nursing note reviewed.  Constitutional:      General: She is not in acute distress.    Appearance: Normal appearance. She is not ill-appearing, toxic-appearing or diaphoretic.  HENT:     Head: Normocephalic and atraumatic.     Right Ear: External ear normal.     Left Ear: External ear normal.     Nose: Nose normal.     Mouth/Throat:     Mouth: Mucous membranes are moist.     Pharynx: Oropharynx is clear.  Eyes:     General: No scleral icterus.       Right eye: No discharge.        Left eye: No discharge.     Extraocular Movements: Extraocular movements intact.     Conjunctiva/sclera: Conjunctivae normal.     Pupils: Pupils are equal, round, and reactive  to light.  Cardiovascular:     Rate and Rhythm: Normal rate and regular rhythm.     Pulses: Normal pulses.     Heart sounds: Normal heart sounds. No murmur heard. No friction rub. No gallop.   Pulmonary:     Effort: Pulmonary effort is normal. No respiratory distress.     Breath sounds: Normal breath sounds. No stridor. No wheezing, rhonchi or rales.  Chest:     Chest wall: No tenderness.  Musculoskeletal:        General: Normal range of motion.     Cervical back: Normal range of motion and neck supple.  Skin:    General: Skin is warm and dry.     Capillary Refill: Capillary refill takes less than 2 seconds.     Coloration: Skin is not jaundiced or pale.     Findings: No bruising, erythema, lesion or rash.   Neurological:     General: No focal deficit present.     Mental Status: She is alert and oriented to person, place, and time. Mental status is at baseline.  Psychiatric:        Mood and Affect: Mood normal.        Behavior: Behavior normal.        Thought Content: Thought content normal.        Judgment: Judgment normal.     Results for orders placed or performed in visit on 08/07/20  Urine Culture   Specimen: Urine   UR  Result Value Ref Range   Urine Culture, Routine Final report (A)    Organism ID, Bacteria Klebsiella pneumoniae (A)    Antimicrobial Susceptibility Comment   Microscopic Examination   Urine  Result Value Ref Range   WBC, UA 6-10 (A) 0 - 5 /hpf   RBC None seen 0 - 2 /hpf   Epithelial Cells (non renal) 0-10 0 - 10 /hpf   Bacteria, UA Many (A) None seen/Few  Microalbumin, Urine Waived  Result Value Ref Range   Microalb, Ur Waived 80 (H) 0 - 19 mg/L   Creatinine, Urine Waived 100 10 - 300 mg/dL   Microalb/Creat Ratio 30-300 (H) <30 mg/g  Bayer DCA Hb A1c Waived  Result Value Ref Range   HB A1C (BAYER DCA - WAIVED) 5.1 <7.0 %  CBC with Differential/Platelet  Result Value Ref Range   WBC 6.2 3.4 - 10.8 x10E3/uL   RBC 4.82 3.77 - 5.28 x10E6/uL   Hemoglobin 13.7 11.1 - 15.9 g/dL   Hematocrit 41.9 34.0 - 46.6 %   MCV 87 79 - 97 fL   MCH 28.4 26.6 - 33.0 pg   MCHC 32.7 31.5 - 35.7 g/dL   RDW 14.1 11.7 - 15.4 %   Platelets 121 (L) 150 - 450 x10E3/uL   Neutrophils 74 Not Estab. %   Lymphs 17 Not Estab. %   Monocytes 8 Not Estab. %   Eos 1 Not Estab. %   Basos 0 Not Estab. %   Neutrophils Absolute 4.5 1.4 - 7.0 x10E3/uL   Lymphocytes Absolute 1.1 0.7 - 3.1 x10E3/uL   Monocytes Absolute 0.5 0.1 - 0.9 x10E3/uL   EOS (ABSOLUTE) 0.1 0.0 - 0.4 x10E3/uL   Basophils Absolute 0.0 0.0 - 0.2 x10E3/uL   Immature Granulocytes 0 Not Estab. %   Immature Grans (Abs) 0.0 0.0 - 0.1 x10E3/uL  Comprehensive metabolic panel  Result Value Ref Range   Glucose 61 (L) 65 -  99 mg/dL   BUN 10 8 -  27 mg/dL   Creatinine, Ser 0.80 0.57 - 1.00 mg/dL   GFR calc non Af Amer 74 >59 mL/min/1.73   GFR calc Af Amer 85 >59 mL/min/1.73   BUN/Creatinine Ratio 13 12 - 28   Sodium 139 134 - 144 mmol/L   Potassium 2.7 (L) 3.5 - 5.2 mmol/L   Chloride 94 (L) 96 - 106 mmol/L   CO2 26 20 - 29 mmol/L   Calcium 9.3 8.7 - 10.3 mg/dL   Total Protein 6.4 6.0 - 8.5 g/dL   Albumin 4.3 3.7 - 4.7 g/dL   Globulin, Total 2.1 1.5 - 4.5 g/dL   Albumin/Globulin Ratio 2.0 1.2 - 2.2   Bilirubin Total 0.5 0.0 - 1.2 mg/dL   Alkaline Phosphatase 138 (H) 44 - 121 IU/L   AST 125 (H) 0 - 40 IU/L   ALT 96 (H) 0 - 32 IU/L  Urinalysis, Routine w reflex microscopic  Result Value Ref Range   Specific Gravity, UA 1.015 1.005 - 1.030   pH, UA 6.5 5.0 - 7.5   Color, UA Yellow Yellow   Appearance Ur Hazy (A) Clear   Leukocytes,UA 1+ (A) Negative   Protein,UA Trace (A) Negative/Trace   Glucose, UA Negative Negative   Ketones, UA Trace (A) Negative   RBC, UA Negative Negative   Bilirubin, UA Negative Negative   Urobilinogen, Ur 1.0 0.2 - 1.0 mg/dL   Nitrite, UA Negative Negative   Microscopic Examination See below:       Assessment & Plan:   Problem List Items Addressed This Visit      Endocrine   Type 2 diabetes mellitus with hyperglycemia ([place]) - Primary   Relevant Orders   Microalbumin, Urine Waived (Completed)   Bayer DCA Hb A1c Waived (Completed)    Other Visit Diagnoses    Non-intractable vomiting with nausea, unspecified vomiting type       Concern for hypoglycemia with UTI. Will stop trulicity and treat UTI. Recheck in a couple of days.    Relevant Orders   CBC with Differential/Platelet (Completed)   Comprehensive metabolic panel (Completed)   Urinalysis, Routine w reflex microscopic (Completed)   Pyuria       Will check culture. Await results.    Relevant Orders   Urine Culture (Completed)   Acute cystitis without hematuria       Will treat. Call if not getting better or  getting worse.        Follow up plan: Return in about 3 weeks (around 08/28/2020).

## 2020-08-08 ENCOUNTER — Ambulatory Visit: Payer: Medicare Other

## 2020-08-08 LAB — COMPREHENSIVE METABOLIC PANEL
ALT: 96 IU/L — ABNORMAL HIGH (ref 0–32)
AST: 125 IU/L — ABNORMAL HIGH (ref 0–40)
Albumin/Globulin Ratio: 2 (ref 1.2–2.2)
Albumin: 4.3 g/dL (ref 3.7–4.7)
Alkaline Phosphatase: 138 IU/L — ABNORMAL HIGH (ref 44–121)
BUN/Creatinine Ratio: 13 (ref 12–28)
BUN: 10 mg/dL (ref 8–27)
Bilirubin Total: 0.5 mg/dL (ref 0.0–1.2)
CO2: 26 mmol/L (ref 20–29)
Calcium: 9.3 mg/dL (ref 8.7–10.3)
Chloride: 94 mmol/L — ABNORMAL LOW (ref 96–106)
Creatinine, Ser: 0.8 mg/dL (ref 0.57–1.00)
GFR calc Af Amer: 85 mL/min/{1.73_m2} (ref >59)
GFR calc non Af Amer: 74 mL/min/{1.73_m2} (ref >59)
Globulin, Total: 2.1 g/dL (ref 1.5–4.5)
Glucose: 61 mg/dL — ABNORMAL LOW (ref 65–99)
Potassium: 2.7 mmol/L — ABNORMAL LOW (ref 3.5–5.2)
Sodium: 139 mmol/L (ref 134–144)
Total Protein: 6.4 g/dL (ref 6.0–8.5)

## 2020-08-08 LAB — CBC WITH DIFFERENTIAL/PLATELET
Basophils Absolute: 0 10*3/uL (ref 0.0–0.2)
Basos: 0 %
EOS (ABSOLUTE): 0.1 10*3/uL (ref 0.0–0.4)
Eos: 1 %
Hematocrit: 41.9 % (ref 34.0–46.6)
Hemoglobin: 13.7 g/dL (ref 11.1–15.9)
Immature Grans (Abs): 0 10*3/uL (ref 0.0–0.1)
Immature Granulocytes: 0 %
Lymphocytes Absolute: 1.1 10*3/uL (ref 0.7–3.1)
Lymphs: 17 %
MCH: 28.4 pg (ref 26.6–33.0)
MCHC: 32.7 g/dL (ref 31.5–35.7)
MCV: 87 fL (ref 79–97)
Monocytes Absolute: 0.5 10*3/uL (ref 0.1–0.9)
Monocytes: 8 %
Neutrophils Absolute: 4.5 10*3/uL (ref 1.4–7.0)
Neutrophils: 74 %
Platelets: 121 10*3/uL — ABNORMAL LOW (ref 150–450)
RBC: 4.82 x10E6/uL (ref 3.77–5.28)
RDW: 14.1 % (ref 11.7–15.4)
WBC: 6.2 10*3/uL (ref 3.4–10.8)

## 2020-08-08 NOTE — Progress Notes (Incomplete)
BP (!) 161/89   Pulse 78   Temp 97.7 F (36.5 C)   Wt 156 lb 9.6 oz (71 kg)   SpO2 98%   BMI 26.88 kg/m    Subjective:    [name], female    DOB: 09/06/1945, 74 y.o.   MRN: [id number]  HPI: Sarah White is a 74 y.o. female  Chief Complaint  Patient presents with  . Diabetes  . Emesis    pt states she has been vomitting for 3 days, has an appetite but can't keep anything down   . Fatigue    pt states she feels really tired, states she feels very weak and can't walk very far.   . Fall    pt states she falls a lot feels very weak, pt fell about 2 days ago and hurt her sides and back    Has been vomiting x3 days. No fevers. No sick contacts. HAs been a little dizzy first thing in the AM. BP has been very labile  Has been having issues with balance. Did not go to PT. Refuses home PT  DIABETES Hypoglycemic episodes:{Blank single:19197::"yes","no"} Polydipsia/polyuria: {Blank single:19197::"yes","no"} Visual disturbance: {Blank single:19197::"yes","no"} Chest pain: {Blank single:19197::"yes","no"} Paresthesias: {Blank single:19197::"yes","no"} Glucose Monitoring: {Blank single:19197::"yes","no"}  Accucheck frequency: {Blank single:19197::"Not Checking","Daily","BID","TID"} Taking Insulin?: {Blank single:19197::"yes","no"} Blood Pressure Monitoring: {Blank single:19197::"not checking","rarely","daily","weekly","monthly","a few times a day","a few times a week","a few times a month"} Retinal Examination: {Blank single:19197::"Up to Date","Not up to Date"} Foot Exam: {Blank single:19197::"Up to Date","Not up to Date"} Diabetic Education: {Blank single:19197::"Completed","Not Completed"} Pneumovax: {Blank single:19197::"Up to Date","Not up to Date","unknown"} Influenza: {Blank single:19197::"Up to Date","Not up to Date","unknown"} Aspirin: {Blank single:19197::"yes","no"}   Relevant past medical, surgical, family and social history reviewed and updated as  indicated. Interim medical history since our last visit reviewed. Allergies and medications reviewed and updated.  Review of Systems  Constitutional: Positive for activity change, appetite change and fatigue. Negative for chills, diaphoresis, fever and unexpected weight change.  Respiratory: Negative.   Cardiovascular: Negative.   Gastrointestinal: Positive for nausea and vomiting. Negative for abdominal distention, abdominal pain, anal bleeding, blood in stool, constipation, diarrhea and rectal pain.  Genitourinary: Negative.   Musculoskeletal: Negative.   Psychiatric/Behavioral: Negative.     Per HPI unless specifically indicated above     Objective:    BP (!) 161/89   Pulse 78   Temp 97.7 F (36.5 C)   Wt 156 lb 9.6 oz (71 kg)   SpO2 98%   BMI 26.88 kg/m   Wt Readings from Last 3 Encounters:  08/07/20 156 lb 9.6 oz (71 kg)  08/05/20 156 lb 12.8 oz (71.1 kg)  06/28/20 164 lb (74.4 kg)    Physical Exam Vitals and nursing note reviewed.  Constitutional:      General: She is not in acute distress.    Appearance: Normal appearance. She is obese. She is ill-appearing. She is not toxic-appearing or diaphoretic.  HENT:     Head: Normocephalic and atraumatic.     Right Ear: External ear normal.     Left Ear: External ear normal.     Nose: Nose normal.     Mouth/Throat:     Mouth: Mucous membranes are moist.     Pharynx: Oropharynx is clear.  Eyes:     General: No scleral icterus.       Right eye: No discharge.        Left eye: No discharge.     Extraocular Movements: Extraocular movements  intact.     Conjunctiva/sclera: Conjunctivae normal.     Pupils: Pupils are equal, round, and reactive to light.  Cardiovascular:     Rate and Rhythm: Normal rate and regular rhythm.     Pulses: Normal pulses.     Heart sounds: Normal heart sounds. No murmur heard. No friction rub. No gallop.   Pulmonary:     Effort: Pulmonary effort is normal. No respiratory distress.     Breath  sounds: Normal breath sounds. No stridor. No wheezing, rhonchi or rales.  Chest:     Chest wall: No tenderness.  Musculoskeletal:        General: Normal range of motion.     Cervical back: Normal range of motion and neck supple.  Skin:    General: Skin is warm and dry.     Capillary Refill: Capillary refill takes less than 2 seconds.     Coloration: Skin is pale. Skin is not jaundiced.     Findings: No bruising, erythema, lesion or rash.  Neurological:     General: No focal deficit present.     Mental Status: She is alert and oriented to person, place, and time. Mental status is at baseline.  Psychiatric:        Mood and Affect: Mood normal.        Behavior: Behavior normal.        Thought Content: Thought content normal.        Judgment: Judgment normal.     Results for orders placed or performed in visit on 05/17/20  Microscopic Examination   Urine  Result Value Ref Range   WBC, UA 6-10 (A) 0 - 5 /hpf   RBC 3-10 (A) 0 - 2 /hpf   Epithelial Cells (non renal) 0-10 0 - 10 /hpf   Casts Present None seen /lpf   Cast Type Granular casts (A) N/A   Crystals Present N/A   Crystal Type Calcium Oxalate N/A   Mucus, UA Present Not Estab.   Bacteria, UA Many (A) None seen/Few   Yeast, UA Present None seen  Urine Culture, Reflex   Urine  Result Value Ref Range   Urine Culture, Routine Final report    Organism ID, Bacteria Comment   UA/M w/rflx Culture, Routine   Specimen: Urine   Urine  Result Value Ref Range   Specific Gravity, UA 1.025 1.005 - 1.030   pH, UA 5.0 5.0 - 7.5   Color, UA Yellow Yellow   Appearance Ur Clear Clear   Leukocytes,UA 2+ (A) Negative   Protein,UA Negative Negative/Trace   Glucose, UA Negative Negative   Ketones, UA Negative Negative   RBC, UA Trace (A) Negative   Bilirubin, UA Negative Negative   Urobilinogen, Ur 0.2 0.2 - 1.0 mg/dL   Nitrite, UA Negative Negative   Microscopic Examination See below:    Urinalysis Reflex Comment        Assessment & Plan:   Problem List Items Addressed This Visit      Endocrine   Type 2 diabetes mellitus with hyperglycemia ([facility]) - Primary   Relevant Orders   Microalbumin, Urine Waived   Bayer DCA Hb A1c Waived    Other Visit Diagnoses    Non-intractable vomiting with nausea, unspecified vomiting type       Relevant Orders   CBC with Differential/Platelet   Comprehensive metabolic panel       Follow up plan: No follow-ups on file.

## 2020-08-11 ENCOUNTER — Other Ambulatory Visit: Payer: Self-pay | Admitting: Family Medicine

## 2020-08-11 DIAGNOSIS — E876 Hypokalemia: Secondary | ICD-10-CM

## 2020-08-11 MED ORDER — POTASSIUM CHLORIDE CRYS ER 20 MEQ PO TBCR: 20.0000 meq | EXTENDED_RELEASE_TABLET | Freq: Every day | ORAL | 0 refills | Status: DC

## 2020-08-12 ENCOUNTER — Encounter: Payer: Medicare Other | Admitting: Physical Therapy

## 2020-08-12 LAB — URINE CULTURE

## 2020-08-12 NOTE — Progress Notes (Signed)
Contacted via [place] afternoon Jiya, your urine culture has returned and it appears to be sensitive to the antibiotic that Dr. Wynetta Emery sent in for you.  If ongoing symptoms please return to office.  For now complete antibiotic regimen and ensure plenty of fluids.  Have a great day!!

## 2020-08-19 ENCOUNTER — Other Ambulatory Visit: Payer: Self-pay

## 2020-08-19 ENCOUNTER — Other Ambulatory Visit: Payer: Medicare Other

## 2020-08-19 ENCOUNTER — Encounter: Payer: Self-pay | Admitting: Family Medicine

## 2020-08-19 DIAGNOSIS — E876 Hypokalemia: Secondary | ICD-10-CM | POA: Diagnosis not present

## 2020-08-20 ENCOUNTER — Telehealth: Payer: Self-pay | Admitting: Pharmacist

## 2020-08-20 ENCOUNTER — Telehealth: Payer: Self-pay

## 2020-08-20 LAB — BASIC METABOLIC PANEL
BUN/Creatinine Ratio: 11 — ABNORMAL LOW (ref 12–28)
BUN: 9 mg/dL (ref 8–27)
CO2: 25 mmol/L (ref 20–29)
Calcium: 8.7 mg/dL (ref 8.7–10.3)
Chloride: 101 mmol/L (ref 96–106)
Creatinine, Ser: 0.83 mg/dL (ref 0.57–1.00)
GFR calc Af Amer: 81 mL/min/{1.73_m2} (ref >59)
GFR calc non Af Amer: 71 mL/min/{1.73_m2} (ref >59)
Glucose: 106 mg/dL — ABNORMAL HIGH (ref 65–99)
Potassium: 3.7 mmol/L (ref 3.5–5.2)
Sodium: 140 mmol/L (ref 134–144)

## 2020-08-20 NOTE — Telephone Encounter (Signed)
Copied from [place] [id number]. Topic: General - Other >> Aug 20, 2020  4:03 PM Leward Quan A wrote: Reason for CRM: Patient request a call back to go over her lab result from 08/19/20. Please call Ph# [phone]

## 2020-08-20 NOTE — Telephone Encounter (Signed)
I think she is talking about her 08/07/20 urine culture, looks like she had Macrobid.  I think this got sent to me by mistake:)

## 2020-08-20 NOTE — Telephone Encounter (Signed)
I didn't see her 12/20, I'm unsure what results she's talking about.

## 2020-08-20 NOTE — Chronic Care Management (AMB) (Addendum)
08/20/20- CPA called and spoke to the patient regarding patient assistance applications ready to be signed for her Trulicity, Entresto, and Symbicort medications. CPA advised the patient to please come into PCP's office tomorrow 08/21/20 to have this completed. The patient verbalized "Ok, I will come in tomorrow".  Raynelle Highland, [place] Assistant [phone]

## 2020-08-20 NOTE — Telephone Encounter (Signed)
Can the antibiotic be changed to a different one, this one gave her severe diarrhea, she had 3 accidents in one day

## 2020-08-20 NOTE — Telephone Encounter (Signed)
Can a different antibiotic be sent in patient did not finish last one due to severe diarrhea

## 2020-08-20 NOTE — Telephone Encounter (Signed)
Since it's been 2 weeks since she had the UTI- can we please find out how she's feeling? If she's better, we can recheck her urine, if not, I can send in something else.

## 2020-08-21 MED ORDER — AMOXICILLIN 500 MG PO CAPS: 500.0000 mg | ORAL_CAPSULE | Freq: Two times a day (BID) | ORAL | 0 refills | Status: DC

## 2020-08-21 NOTE — Telephone Encounter (Signed)
Please see message from [place] 

## 2020-08-21 NOTE — Telephone Encounter (Signed)
Pt stated she is not feeling better at all stated the antibiotic she only took 5 out of the 14 due to the reaction she had, antibiotics hurt her stomach and gave her diarrhea and would wake up with a mess in her bed.Pt would like a different antibiotic to be sent as she stopped use of the one she was taking, but still continues to have the UTI SX.

## 2020-08-21 NOTE — Addendum Note (Signed)
Addended by: Valerie Roys on: 08/21/2020 11:00 AM   Modules accepted: Orders

## 2020-08-21 NOTE — Telephone Encounter (Signed)
Rx at the pharmacy 

## 2020-08-21 NOTE — Telephone Encounter (Signed)
Called and left a message for patient to return my call.  

## 2020-08-21 NOTE — Telephone Encounter (Signed)
Patient notified

## 2020-08-28 ENCOUNTER — Ambulatory Visit (INDEPENDENT_AMBULATORY_CARE_PROVIDER_SITE_OTHER): Payer: Medicare Other | Admitting: Family Medicine

## 2020-08-28 ENCOUNTER — Encounter: Payer: Self-pay | Admitting: Family Medicine

## 2020-08-28 ENCOUNTER — Other Ambulatory Visit: Payer: Self-pay

## 2020-08-28 ENCOUNTER — Telehealth: Payer: Self-pay | Admitting: Family Medicine

## 2020-08-28 VITALS — BP 153/82 | HR 75 | Temp 98.8°F | Wt 160.2 lb

## 2020-08-28 DIAGNOSIS — E1165 Type 2 diabetes mellitus with hyperglycemia: Secondary | ICD-10-CM | POA: Diagnosis not present

## 2020-08-28 DIAGNOSIS — N3000 Acute cystitis without hematuria: Secondary | ICD-10-CM

## 2020-08-28 DIAGNOSIS — I208 Other forms of angina pectoris: Secondary | ICD-10-CM

## 2020-08-28 DIAGNOSIS — R112 Nausea with vomiting, unspecified: Secondary | ICD-10-CM

## 2020-08-28 NOTE — Progress Notes (Signed)
BP (!) 153/82   Pulse 75   Temp 98.8 F (37.1 C)   Wt 160 lb 3.2 oz (72.7 kg)   SpO2 96%   BMI 27.50 kg/m    Subjective:    [name], female    DOB: 07/14/1946, 74 y.o.   MRN: [id number]  HPI: Sarah White is a 74 y.o. female  Chief Complaint  Patient presents with  . Hypoglycemia    Pt here to follow up low sugars and uti   Nausea is gone. Dizziness is almost gone. She is feeling much better. She notes that her sugars were up a little bit 1 night so she took a trulicity. She had a little nausea after that. She is otherwise feeling well.   DIABETES Hypoglycemic episodes:yes Polydipsia/polyuria: no Visual disturbance: no Chest pain: no Paresthesias: no Glucose Monitoring: yes  Accucheck frequency: Daily Taking Insulin?: no Blood Pressure Monitoring: not checking Retinal Examination: Not up to Date Foot Exam: Up to Date Diabetic Education: Completed Pneumovax: Up to Date Influenza: Up to Date Aspirin: yes   Relevant past medical, surgical, family and social history reviewed and updated as indicated. Interim medical history since our last visit reviewed. Allergies and medications reviewed and updated.  Review of Systems  Constitutional: Negative.   Respiratory: Negative.   Cardiovascular: Negative.   Gastrointestinal: Negative.   Genitourinary: Negative.   Musculoskeletal: Negative.   Neurological: Negative.   Psychiatric/Behavioral: Negative.     Per HPI unless specifically indicated above     Objective:    BP (!) 153/82   Pulse 75   Temp 98.8 F (37.1 C)   Wt 160 lb 3.2 oz (72.7 kg)   SpO2 96%   BMI 27.50 kg/m   Wt Readings from Last 3 Encounters:  08/28/20 160 lb 3.2 oz (72.7 kg)  08/07/20 156 lb 9.6 oz (71 kg)  08/05/20 156 lb 12.8 oz (71.1 kg)    Physical Exam Vitals and nursing note reviewed.  Constitutional:      General: She is not in acute distress.    Appearance: Normal appearance. She is not ill-appearing,  toxic-appearing or diaphoretic.  HENT:     Head: Normocephalic and atraumatic.     Right Ear: External ear normal.     Left Ear: External ear normal.     Nose: Nose normal.     Mouth/Throat:     Mouth: Mucous membranes are moist.     Pharynx: Oropharynx is clear.  Eyes:     General: No scleral icterus.       Right eye: No discharge.        Left eye: No discharge.     Extraocular Movements: Extraocular movements intact.     Conjunctiva/sclera: Conjunctivae normal.     Pupils: Pupils are equal, round, and reactive to light.  Cardiovascular:     Rate and Rhythm: Normal rate and regular rhythm.     Pulses: Normal pulses.     Heart sounds: Normal heart sounds. No murmur heard. No friction rub. No gallop.   Pulmonary:     Effort: Pulmonary effort is normal. No respiratory distress.     Breath sounds: Normal breath sounds. No stridor. No wheezing, rhonchi or rales.  Chest:     Chest wall: No tenderness.  Musculoskeletal:        General: Normal range of motion.     Cervical back: Normal range of motion and neck supple.  Skin:    General: Skin  is warm and dry.     Capillary Refill: Capillary refill takes less than 2 seconds.     Coloration: Skin is not jaundiced or pale.     Findings: No bruising, erythema, lesion or rash.  Neurological:     General: No focal deficit present.     Mental Status: She is alert and oriented to person, place, and time. Mental status is at baseline.  Psychiatric:        Mood and Affect: Mood normal.        Behavior: Behavior normal.        Thought Content: Thought content normal.        Judgment: Judgment normal.     Results for orders placed or performed in visit on 08/28/20  Urine Culture   Specimen: Urine   UR  Result Value Ref Range   Urine Culture, Routine Final report    Organism ID, Bacteria Comment   Microscopic Examination   Urine  Result Value Ref Range   WBC, UA 6-10 (A) 0 - 5 /hpf   RBC 0-2 0 - 2 /hpf   Epithelial Cells (non  renal) 0-10 0 - 10 /hpf   Bacteria, UA Few (A) None seen/Few   Yeast, UA Present None seen  Urinalysis, Routine w reflex microscopic  Result Value Ref Range   Specific Gravity, UA 1.020 1.005 - 1.030   pH, UA 6.0 5.0 - 7.5   Color, UA Yellow Yellow   Appearance Ur Hazy (A) Clear   Leukocytes,UA 1+ (A) Negative   Protein,UA Trace (A) Negative/Trace   Glucose, UA Negative Negative   Ketones, UA Negative Negative   RBC, UA Trace (A) Negative   Bilirubin, UA Negative Negative   Urobilinogen, Ur 0.2 0.2 - 1.0 mg/dL   Nitrite, UA Negative Negative   Microscopic Examination See below:       Assessment & Plan:   Problem List Items Addressed This Visit      Endocrine   Type 2 diabetes mellitus with hyperglycemia ([facility]CC)    Feeling much better off medicine. Will recheck A1c in 2 months. Call with any concerns. Continue to monitor.        Other Visit Diagnoses    Acute cystitis without hematuria    -  Primary   Rechecking urine. Await results. Treat as needed.    Relevant Orders   Urinalysis, Routine w reflex microscopic (Completed)   Urine Culture (Completed)   Non-intractable vomiting with nausea, unspecified vomiting type       Resolved. Feeling much better. Continue to monitor.        Follow up plan: Return in about 2 months (around 10/28/2020).

## 2020-08-29 ENCOUNTER — Ambulatory Visit: Payer: Medicare Other | Admitting: Family Medicine

## 2020-08-29 LAB — MICROSCOPIC EXAMINATION

## 2020-08-29 LAB — URINALYSIS, ROUTINE W REFLEX MICROSCOPIC
Bilirubin, UA: NEGATIVE
Glucose, UA: NEGATIVE
Ketones, UA: NEGATIVE
Nitrite, UA: NEGATIVE
Specific Gravity, UA: 1.02 (ref 1.005–1.030)
Urobilinogen, Ur: 0.2 mg/dL (ref 0.2–1.0)
pH, UA: 6 (ref 5.0–7.5)

## 2020-08-30 LAB — URINE CULTURE

## 2020-09-02 ENCOUNTER — Ambulatory Visit: Payer: Medicare Other | Admitting: Pharmacist

## 2020-09-02 DIAGNOSIS — E1165 Type 2 diabetes mellitus with hyperglycemia: Secondary | ICD-10-CM

## 2020-09-02 DIAGNOSIS — E782 Mixed hyperlipidemia: Secondary | ICD-10-CM

## 2020-09-02 DIAGNOSIS — I10 Essential (primary) hypertension: Secondary | ICD-10-CM

## 2020-09-02 DIAGNOSIS — I2510 Atherosclerotic heart disease of native coronary artery without angina pectoris: Secondary | ICD-10-CM

## 2020-09-02 NOTE — Chronic Care Management (AMB) (Signed)
Chronic Care Management Pharmacy  Name: Sarah White  MRN: [id number] DOB: 1946-07-04   Chief Complaint/ HPI  Sarah White,  75 y.o. , female presents for her Initial CCM visit with the clinical pharmacist via telephone.  PCP : Valerie Roys, DO [name], [name] Yolonda Kida, MD as Consulting Physician (Cardiology) Yolonda Kida, MD as Consulting Physician (Cardiology) Vanita Ingles, RN as Case Manager ([place]) Vladimir Faster, [facility] (Pharmacist)  Patient's chronic conditions include: Hypertension, Hyperlipidemia, Diabetes, Heart Failure, Coronary Artery Disease, COPD, Chronic Kidney Disease, Depression and insomnia and tobacco abuse  Office Visits: 08/07/20- Dr. Wynetta Emery - bloodwork, uti- macrobid, stop trulicity while treating uti, [place] con- 20 meq qd  Consult Visit: 08/05/20- Dr. Delana Meyer, vascular -  Bilateral carotid US- Rt 1-39 % stenosis, Lt 40-59% stenosis (rt stent 03/06/20) continue antiplt, exercise 4 days week 07/30/20- Dr. Melrose Nakayama, Neuro- Aricept 5 mg qpm, walk regularly, memory eval q 6 months, 06/26/20- Dr. Melrose Nakayama, Neuro carvedilol 12.52m qam, 25 mg qpm 04/10/20-[name], NP-Cards -    Subjective: "I'm doing good. I got through the holidays without falling"  Objective: Allergies  Allergen Reactions  . Amlodipine Swelling  . Atorvastatin Other (See Comments)    myalgia  . Codeine Nausea And Vomiting  . Hctz [Hydrochlorothiazide] Other (See Comments)    Hypokalemia  . Lisinopril Other (See Comments)    Dizziness   . Metformin And Related Nausea Only    Nausea, dizziness    Medications: Outpatient Encounter Medications as of 09/02/2020  Medication Sig Note  . albuterol (ACCUNEB) 0.63 MG/3ML nebulizer solution Take 1 ampule by nebulization every 6 (six) hours as needed for wheezing.   .Marland Kitchenamiodarone (PACERONE) 200 MG tablet Take 100 mg by mouth daily.    .Marland Kitchenaspirin EC 81 MG  tablet Take 81 mg by mouth at bedtime.    . budesonide-formoterol (SYMBICORT) 160-4.5 MCG/ACT inhaler Inhale 2 puffs into the lungs 2 (two) times daily.   .Marland KitchenbuPROPion (WELLBUTRIN SR) 150 MG 12 hr tablet Take 1 tablet by mouth twice daily   . carvedilol (COREG) 12.5 MG tablet 1 tab in the AM and 2 tabs at night   . clopidogrel (PLAVIX) 75 MG tablet Take 1 tablet (75 mg total) by mouth daily at 6 (six) AM.   . cyclobenzaprine (FLEXERIL) 10 MG tablet TAKE 1 TABLET BY MOUTH THREE TIMES DAILY AS NEEDED FOR  MUSCLE  SPASMS. DO NOT DRIVE ON THIS MEDICATION. WILL MAKE YOU SLEEPY.   . diphenhydrAMINE (BENADRYL) 25 MG tablet Take 25 mg by mouth at bedtime as needed for allergies. Dies not take regularly 7August 19, 2021 Taking 1 QPM  . donepezil (ARICEPT) 5 MG tablet Take 5 mg by mouth daily.   . furosemide (LASIX) 40 MG tablet Take 20 mg by mouth daily.    . isosorbide mononitrate (IMDUR) 60 MG 24 hr tablet Take 60 mg by mouth daily.   . nitroGLYCERIN (NITROSTAT) 0.4 MG SL tablet Place 1 tablet (0.4 mg total) under the tongue every 5 (five) minutes as needed for chest pain.   .Marland KitchenQUEtiapine (SEROQUEL XR) 50 MG TB24 24 hr tablet Take 1 tablet (50 mg total) by mouth at bedtime.   . rosuvastatin (CRESTOR) 20 MG tablet Take 1 tablet by mouth once daily   . sacubitril-valsartan (ENTRESTO) 49-51 MG Take 1 tablet by mouth 2 (two) times daily.    . sertraline (ZOLOFT) 100 MG  tablet Take 2 tablets (200 mg total) by mouth daily.   . traZODone (DESYREL) 50 MG tablet TAKE 1/2 TO 1 (ONE-HALF TO ONE) TABLET BY MOUTH AT BEDTIME AS NEEDED FOR SLEEP   . albuterol (VENTOLIN HFA) 108 (90 Base) MCG/ACT inhaler Inhale 2 puffs into the lungs every 6 (six) hours as needed for wheezing or shortness of breath. (Patient not taking: Reported on 09/02/2020) 03/26/2020: Taking 2 puffs BID  . Multiple Vitamin (MULTIVITAMIN WITH MINERALS) TABS tablet Take 1 tablet by mouth daily. (Patient not taking: No sig reported)   . ondansetron (ZOFRAN-ODT) 4  MG disintegrating tablet Take 1 tablet (4 mg total) by mouth every 8 (eight) hours as needed for nausea or vomiting. (Patient not taking: Reported on 09/02/2020)   . potassium chloride SA (KLOR-CON) 20 MEQ tablet Take 1 tablet (20 mEq total) by mouth daily.   . silver sulfADIAZINE (SILVADENE) 1 % cream Apply 1 application topically daily. (Patient taking differently: Apply 1 application topically as needed.)   . SPIRIVA HANDIHALER 18 MCG inhalation capsule PLACE 1 CAPSULE INTO INHALER AND INHALE DAILY (Patient not taking: Reported on 09/02/2020)   . triamcinolone ointment (KENALOG) 0.5 % Apply 1 application topically 2 (two) times daily. (Patient not taking: Reported on 09/02/2020)   . [DISCONTINUED] amoxicillin (AMOXIL) 500 MG capsule Take 1 capsule (500 mg total) by mouth 2 (two) times daily. (Patient not taking: Reported on 09/02/2020)   . [DISCONTINUED] meclizine (ANTIVERT) 12.5 MG tablet Take 1 tablet (12.5 mg total) by mouth 3 (three) times daily as needed for dizziness. (Patient not taking: No sig reported)    No facility-administered encounter medications on file as of 09/02/2020.    Wt Readings from Last 3 Encounters:  08/28/20 160 lb 3.2 oz (72.7 kg)  08/07/20 156 lb 9.6 oz (71 kg)  08/05/20 156 lb 12.8 oz (71.1 kg)    Lab Results  Component Value Date   CREATININE 0.83 08/19/2020   BUN 9 08/19/2020   GFRNONAA 71 08/19/2020   GFRAA 81 08/19/2020   NA 140 08/19/2020   K 3.7 08/19/2020   CALCIUM 8.7 08/19/2020   CO2 25 08/19/2020     Current Diagnosis/Assessment:    Goals Addressed            This Visit's Progress   . pharmacy care plan       CARE PLAN ENTRY (see longitudinal plan of care for additional care plan information)  Current Barriers:  . Chronic Disease Management support, education, and care coordination needs related to Hypertension, Hyperlipidemia, Diabetes, Atrial Fibrillation, Heart Failure, Coronary Artery Disease, COPD, Depression, and Tobacco use    Hypertension BP Readings from Last 3 Encounters:  08/28/20 (!) 153/82  08/07/20 (!) 161/89  08/05/20 128/66   . Pharmacist Clinical Goal(s): o Over the next 60 days, patient will work with PharmD and providers to maintain BP goal <130/80 . Current regimen:   Carvedilol 12.68m qam, 25 mg q pm  Furosemide 20 mg qd  Entresto 24/26 mg bid . Interventions: o Reviewed BP technique and recent readings o Reviewed fill history o Entresto PAP will be faxed for authorization . Patient self care activities - Over the next 60 days, patient will: o Check BP daily, document, and provide at future appointments o Ensure daily salt intake < 2300 mg/day o Increase walking as safely tolerated   Hyperlipidemia/CAD/Carotid stent Lab Results  Component Value Date/Time   LDLCALC 75 04/18/2020 04:23 PM   LDLCALC 156 (H) 01/16/2014 12:00 PM   .  Pharmacist Clinical Goal(s): o Over the next 60 days, patient will work with PharmD and providers to achieve LDL goal < 70 . Current regimen:  . Crestor 20 mg qd  . NTG 0.4 mg sl prn . Aspirin 81 mg qd . Clopidogrel 75 mg qd . Isosorbide mononitrate 60 mg qd . Interventions: o Reveiwed signs/bleeding and when to consider emergent o Reviewed fill history . Patient self care activities - Over the next 60 days, patient will: o Take all medications as prescribed o Focus on diet and exercise  Diabetes Lab Results  Component Value Date/Time   HGBA1C 5.1 08/07/2020 10:33 AM   HGBA1C 5.6 04/18/2020 04:22 PM   HGBA1C 10.8 (H) 01/15/2014 04:08 PM   . Pharmacist Clinical Goal(s): o Over the next 60 days, patient will work with PharmD and providers to maintain A1c goal <7% . Current regimen:  o Currently holding Trulicity 1.5 mg q week . Interventions: o Reviewed BG reading and counseled to maintain an A1c of <7%, we want to see fasting sugars <130 and 2 hour after meal sugars <180.  o Will discuss Trulicity resumption with PCP o Encouraged patient to  check BG occasionally 2-3 hours after meal o Will fax completed PAP renewal for Trulicity. . Patient self care activities - Over the next 60 days, patient will: o Check blood sugar twice daily, document, and provide at future appointments o Contact provider with any episodes of hypoglycemia  COPD/Tobacco Abuse . Pharmacist Clinical Goal(s) o Over the next 60 days, patient will work with PharmD and providers to minimize symptoms, alleviate financial barriers . Current regimen:  o Symbicort 2 puffs bid o Albuterol as needed . Interventions: o Will fax completed PAP for renewal of Symbicort o Will collaborate with PCP for albuterol nebules prescription o Provided smoking cessation counseling . Patient self care activities - Over the next 60 days, patient will: o Take medications as prescribed  Medication management . Pharmacist Clinical Goal(s): o Over the next 60 days, patient will work with PharmD and providers to achieve optimal medication adherence . Current pharmacy: [facility] . Interventions o Comprehensive medication review performed. o Utilize [facility] for medication synchronization, packaging and delivery . Patient self care activities - Over the next 60 days, patient will: o Focus on medication adherence by packaging, med sync and delivery o Take medications as prescribed o Report any questions or concerns to PharmD and/or provider(s)  Initial goal documentation        AFIB   Patient is currently rate and  rhythm controlled. HR 82 BPM  Patient has failed these meds in past: NA Patient is currently controlled on the following medications:  Amiodarone 100 mg am Carvedilol 12.5 mg qam, 25 mg qpm Aspirin 81 mg qd Clopidogrel 75 mg qpm with supper  We discussed:  Patient reports she is still bruising and bleeds easily when she scrapes herself. "The skin just peels off". She denies any hemoptysis, hematuria, bloody sputum or black tarry stools. She has been  taking her medications as prescribed above. She follows with cardiology and vascular. Right ICA stent placed in July.  Plan  Continue current medications  ,  COPD / Asthma / Tobacco   Last spirometry score: Unavailable  Current COPD Classification:  C (low sx, >/=2 exacerbations/yr)  Eosinophil count:   Lab Results  Component Value Date/Time   EOSPCT 0 12/04/2019 04:07 AM   EOSPCT 0.0 01/16/2014 12:00 AM  %  Eos (Absolute):  Lab Results  Component Value Date/Time   EOSABS 0.1 08/07/2020 10:37 AM   EOSABS 0.0 01/16/2014 12:00 AM    Tobacco Status:  Social History   Tobacco Use  Smoking Status Current Every Day Smoker  . Packs/day: 1.00  . Types: Cigarettes  Smokeless Tobacco Never Used    Patient has failed these meds in past: NA  Patient is currently controlled on the following medications:   Ventolin HFA 2 puffs q6h prn  Symbicort 2 puffs bid Using maintenance inhaler regularly? Yes Frequency of rescue inhaler use:  infrequently  We discussed:  proper inhaler technique. Patient is rinsing her mouth out after Symbicort. Patient assistance renewal forms completed for Symbicort. Patient has 3 inhalers remaining. She smokes less than 1 pack per day and is not interested in quitting at this time. She is requesting albuterol nebules instead of an inhaler. Will consult with PCP.  Plan  Continue current medications. ,  Diabetes   Recent Relevant Labs: Lab Results  Component Value Date/Time   HGBA1C 5.1 08/07/2020 10:33 AM   HGBA1C 5.6 04/18/2020 04:22 PM   HGBA1C 10.8 (H) 01/15/2014 04:08 PM   MICROALBUR 80 (H) 08/07/2020 10:33 AM   MICROALBUR 10 08/22/2019 01:08 PM    BMP Latest Ref Rng & Units 08/19/2020 08/07/2020 04/18/2020  Glucose 65 - 99 mg/dL 106(H) 61(L) 95  BUN 8 - 27 mg/dL _0 Creatinine 0.57 - 1.00 mg/dL 0.83 0.80 0.96  BUN/Creat Ratio 12 - 28 11(L) 13 15  Sodium 134 - 144 mmol/L 140 139 138  Potassium 3.5 - 5.2  mmol/L 3.7 2.7(L) 4.3  Chloride 96 - 106 mmol/L 101 94(L) 102  CO2 20 - 29 mmol/L _1 Calcium 8.7 - 10.3 mg/dL 8.7 9.3 8.6(L)  \  Checking BG: Daily  Recent FBG Readings: 191, 200, 202  Patient is currently query  controlled on the following medications:   Trulicity 1.5 mg q week  Last diabetic Foot exam:  Lab Results  Component Value Date/Time   HMDIABEYEEXA No Retinopathy 09/10/2015 01:16 PM   08/07/20- MicrALB/creatinine ratio 30-300, urine microalbumin=80  Last diabetic Eye exam: No results found for: HMDIABFOOTEX   We discussed: Diet and exercise. Patient hasn't taken Trulicity 1.5 mg in approximately 3-4 weeks while receiving treatment for UTI. She completed her Amoxicillin course and has not experienced any hypoglycemia. Will consult with PCP to direction on when to restart.   Plan Continue current medications.  Heart Failure   Type: Systolic  Last ejection fraction: 30-35%   Patient has failed these meds in past: NA  Patient is currently query  controlled on the following medications:  Entresto 24/26 bid Furosemide 20 mg qd (takes 1/2 of a 40 mg tab) Potassium Chloride 20 mg qd --patient stopped taking  We discussed Patient assistance renewal  Application completed. Patient is unable to weigh herself daily because of frequent falls. She reports her current weight is 160 lbs and she previously weighed 212lbs. She stopped taking her potassium supplementation after 08/07/20 stating "my level was good". Reviewed lab values K=3.7 and new RX for potassium sent by PCP. Counseled patient that her fluid pill caused potassium excretion and are usually taken together. Will discuss with PCP and follow up with patient with instruction.  Plan  Continue current medications  and  Hypertension   BP today is:  <140/90  Office blood pressures are  BP Readings from Last 3 Encounters:  08/28/20 (!) 153/82  08/07/20 Marland Kitchen)  161/89  08/05/20 128/66    Patient has failed  these meds in the past: NA Patient home BP readings are ranging: 135/78, highest 140/83 Patient checks BP at home daily  Patient currently query controlled on:  Carvedilol 12.$RemoveBefore'5mg'zzwAnmiHoneih$  qam, 25 mg q pm  Furosemide 20 mg qd  Entresto 24/26 mg bid   We discussed Patient reports she has not fallen recently or experienced any hypotensive episodes. She receives Entresto through patient assistance and has ~2.5 bottles on hand. Renewal application will be completed and faxed to prescriber and manufacturer.  Plan  Continue current medications     Hyperlipidemia/CAD/RT carotid stent   LDL goal < 70  Last lipids Lab Results  Component Value Date   CHOL 142 04/18/2020   HDL 41 04/18/2020   LDLCALC 75 04/18/2020   TRIG 152 (H) 04/18/2020   CHOLHDL 4.3 11/26/2016   Hepatic Function Latest Ref Rng & Units 08/07/2020 04/18/2020 12/18/2019  Total Protein 6.0 - 8.5 g/dL 6.4 6.3 5.7(L)  Albumin 3.7 - 4.7 g/dL 4.3 4.3 3.9  AST 0 - 40 IU/L 125(H) 51(H) 17  ALT 0 - 32 IU/L 96(H) 64(H) 17  Alk Phosphatase 44 - 121 IU/L 138(H) 90 107  Total Bilirubin 0.0 - 1.2 mg/dL 0.5 0.3 <0.2  Bilirubin, Direct 0.0 - 0.2 mg/dL - - -     The ASCVD Risk score (Ludlow., et al., 2013) failed to calculate for the following reasons:   The patient has a prior MI or stroke diagnosis   Patient has failed these meds in past: NA Patient is currently controlled on the following medications:  . Crestor 20 mg qd  . NTG 0.4 mg sl prn . Aspirin 81 mg qd . Clopidogrel 75 mg qd . Isosorbide mononitrate 60 mg qd  We discussed:  Patient is still experiencing bleeding and bruising. She has not needed NTG in months. She had bilateral carotid doppler with vascular on 08/05/20 and stent in July. Denies any missed doses or signs/symptoms of bleeding other then superficially with insult.  Plan  Continue current medications. Follow up lipid panel with next labs.  Memory loss   Patient has failed these meds in past: NA   Patient is currently controlled on the following medications:  . Donepezil 5 mg qd  We discussed:  Patient is taking daily. Following with Dr. Melrose Nakayama from Neuro  Plan  Continue current medications  Will discuss depression, osteoporosis, and vaccinations at follow-up.  Medication Management   Patient's preferred pharmacy is:  [facility] [address] ([address]), [place] - [address] [place])  [address] Phone: [phone] Fax: [phone]  [facility] - [place], [place] - [address] [place] [address] Phone: [phone] Fax: [phone]  Uses pill box? Yes Pt endorses 80% compliance  We discussed: Discussed benefits of medication synchronization, packaging and delivery as well as enhanced pharmacist oversight with [facility].  Plan  Utilize [facility] for medication synchronization, packaging and delivery.  Verbal consent obtained for [facility] enhanced pharmacy services (medication synchronization, adherence packaging, delivery coordination). A medication sync plan was created to allow patient to get all medications delivered once every 30 to 90 days per patient preference. Patient understands they have freedom to choose pharmacy and clinical pharmacist will coordinate care between all prescribers and [facility].  Follow up: 2  month phone visit  Junita Push. Kenton Kingfisher PharmD, BCPS Clinical Pharmacist [facility]  [facility] [phone]

## 2020-09-02 NOTE — Telephone Encounter (Signed)
error 

## 2020-09-02 NOTE — Patient Instructions (Signed)
Visit Information  It was a pleasure speaking with you today. Thank you for letting me be part of your clinical team. Please call with any questions or concerns.   Goals Addressed            This Visit's Progress   . pharmacy care plan       CARE PLAN ENTRY (see longitudinal plan of care for additional care plan information)  Current Barriers:  . Chronic Disease Management support, education, and care coordination needs related to Hypertension, Hyperlipidemia, Diabetes, Atrial Fibrillation, Heart Failure, Coronary Artery Disease, COPD, Depression, and Tobacco use   Hypertension BP Readings from Last 3 Encounters:  08/28/20 (!) 153/82  08/07/20 (!) 161/89  08/05/20 128/66   . Pharmacist Clinical Goal(s): o Over the next 60 days, patient will work with PharmD and providers to maintain BP goal <130/80 . Current regimen:   Carvedilol 12.5mg  qam, 25 mg q pm  Furosemide 20 mg qd  Entresto 24/26 mg bid . Interventions: o Reviewed BP technique and recent readings o Reviewed fill history o Entresto PAP will be faxed for authorization . Patient self care activities - Over the next 60 days, patient will: o Check BP daily, document, and provide at future appointments o Ensure daily salt intake < 2300 mg/day o Increase walking as safely tolerated   Hyperlipidemia/CAD/Carotid stent Lab Results  Component Value Date/Time   LDLCALC 75 04/18/2020 04:23 PM   LDLCALC 156 (H) 01/16/2014 12:00 PM   . Pharmacist Clinical Goal(s): o Over the next 60 days, patient will work with PharmD and providers to achieve LDL goal < 70 . Current regimen:  . Crestor 20 mg qd  . NTG 0.4 mg sl prn . Aspirin 81 mg qd . Clopidogrel 75 mg qd . Isosorbide mononitrate 60 mg qd . Interventions: o Reveiwed signs/bleeding and when to consider emergent o Reviewed fill history . Patient self care activities - Over the next 60 days, patient will: o Take all medications as prescribed o Focus on diet and  exercise  Diabetes Lab Results  Component Value Date/Time   HGBA1C 5.1 08/07/2020 10:33 AM   HGBA1C 5.6 04/18/2020 04:22 PM   HGBA1C 10.8 (H) 01/15/2014 04:08 PM   . Pharmacist Clinical Goal(s): o Over the next 60 days, patient will work with PharmD and providers to maintain A1c goal <7% . Current regimen:  o Currently holding Trulicity 1.5 mg q week . Interventions: o Reviewed BG reading and counseled to maintain an A1c of <7%, we want to see fasting sugars <130 and 2 hour after meal sugars <180.  o Will discuss Trulicity resumption with PCP o Encouraged patient to check BG occasionally 2-3 hours after meal o Will fax completed PAP renewal for Trulicity. . Patient self care activities - Over the next 60 days, patient will: o Check blood sugar twice daily, document, and provide at future appointments o Contact provider with any episodes of hypoglycemia  COPD/Tobacco Abuse . Pharmacist Clinical Goal(s) o Over the next 60 days, patient will work with PharmD and providers to minimize symptoms, alleviate financial barriers . Current regimen:  o Symbicort 2 puffs bid o Albuterol as needed . Interventions: o Will fax completed PAP for renewal of Symbicort o Will collaborate with PCP for albuterol nebules prescription o Provided smoking cessation counseling . Patient self care activities - Over the next 60 days, patient will: o Take medications as prescribed  Medication management . Pharmacist Clinical Goal(s): o Over the next 60 days, patient will  work with PharmD and providers to achieve optimal medication adherence . Current pharmacy: [facility] . Interventions o Comprehensive medication review performed. o Utilize [facility] for medication synchronization, packaging and delivery . Patient self care activities - Over the next 60 days, patient will: o Focus on medication adherence by packaging, med sync and delivery o Take medications as prescribed o Report any questions  or concerns to PharmD and/or provider(s)  Initial goal documentation        The patient verbalized understanding of instructions, educational materials, and care plan provided today and agreed to receive a mailed copy of patient instructions, educational materials, and care plan.   Telephone follow up appointment with pharmacy team member scheduled for: 2 months  Mercer Pod. Tiburcio Pea PharmD, BCPS Clinical Pharmacist [phone]

## 2020-09-03 ENCOUNTER — Telehealth: Payer: Self-pay | Admitting: General Practice

## 2020-09-03 ENCOUNTER — Telehealth: Payer: Medicare Other | Admitting: General Practice

## 2020-09-03 NOTE — Telephone Encounter (Signed)
  Chronic Care Management   Outreach Note  09/03/2020 Name: Sarah White MRN: [id number] DOB: 1946-02-10  Referred by: Dorcas Carrow, DO Reason for referral : Appointment (RNCM Follow up attempt for Chronic Disease Management and Care Coordination Needs)   Sarah White is enrolled in a Managed Medicaid Health Plan: No  An unsuccessful telephone outreach was attempted today. The patient was referred to the case management team for assistance with care management and care coordination.   Follow Up Plan: A HIPAA compliant phone message was left for the patient providing contact information and requesting a return call.   Alto Denver RN, MSN, CCM Community Care Coordinator [place]  [facility] Mobile: [phone]

## 2020-09-13 ENCOUNTER — Telehealth: Payer: Self-pay

## 2020-09-13 NOTE — Chronic Care Management (AMB) (Signed)
  Care Management   Note  09/13/2020 Name: Sarah White MRN: [id number] DOB: [birth year]  Sarah White is a 75 y.o. year old female who is a primary care patient of Valerie Roys, DO and is actively engaged with the care management team. I reached out to [name] by phone today to assist with re-scheduling a follow up visit with the RN Case Manager  Follow up plan: Unsuccessful telephone outreach attempt made. A HIPAA compliant phone message was left for the patient providing contact information and requesting a return call.  The care management team will reach out to the patient again over the next 7 days.  If patient returns call to provider office, please advise to call [facility]  at [place], [place], [place], [place] [address] Direct Dial: [phone] [email or web address]

## 2020-09-15 ENCOUNTER — Encounter: Payer: Self-pay | Admitting: Family Medicine

## 2020-09-15 NOTE — Assessment & Plan Note (Signed)
Feeling much better off medicine. Will recheck A1c in 2 months. Call with any concerns. Continue to monitor.

## 2020-09-16 ENCOUNTER — Telehealth: Payer: Self-pay | Admitting: Pharmacist

## 2020-09-16 NOTE — Progress Notes (Signed)
Chronic Care Management Pharmacy Assistant   Name: Sarah White  MRN: [id number] DOB: 1945-12-30  Reason for Encounter: Coordination of Enhanced Pharmacy Services.     PCP : Valerie Roys, DO  Allergies:   Allergies  Allergen Reactions  . Amlodipine Swelling  . Atorvastatin Other (See Comments)    myalgia  . Codeine Nausea And Vomiting  . Hctz [Hydrochlorothiazide] Other (See Comments)    Hypokalemia  . Lisinopril Other (See Comments)    Dizziness   . Metformin And Related Nausea Only    Nausea, dizziness    Medications: Outpatient Encounter Medications as of 09/16/2020  Medication Sig Note  . albuterol (ACCUNEB) 0.63 MG/3ML nebulizer solution Take 1 ampule by nebulization every 6 (six) hours as needed for wheezing.   Marland Kitchen albuterol (VENTOLIN HFA) 108 (90 Base) MCG/ACT inhaler Inhale 2 puffs into the lungs every 6 (six) hours as needed for wheezing or shortness of breath. (Patient not taking: Reported on 09/02/2020) 04/24/2020: Taking 2 puffs BID  . amiodarone (PACERONE) 200 MG tablet Take 100 mg by mouth daily.    Marland Kitchen aspirin EC 81 MG tablet Take 81 mg by mouth at bedtime.    . budesonide-formoterol (SYMBICORT) 160-4.5 MCG/ACT inhaler Inhale 2 puffs into the lungs 2 (two) times daily.   Marland Kitchen buPROPion (WELLBUTRIN SR) 150 MG 12 hr tablet Take 1 tablet by mouth twice daily   . carvedilol (COREG) 12.5 MG tablet 1 tab in the AM and 2 tabs at night   . clopidogrel (PLAVIX) 75 MG tablet Take 1 tablet (75 mg total) by mouth daily at 6 (six) AM.   . cyclobenzaprine (FLEXERIL) 10 MG tablet TAKE 1 TABLET BY MOUTH THREE TIMES DAILY AS NEEDED FOR  MUSCLE  SPASMS. DO NOT DRIVE ON THIS MEDICATION. WILL MAKE YOU SLEEPY.   . diphenhydrAMINE (BENADRYL) 25 MG tablet Take 25 mg by mouth at bedtime as needed for allergies. Dies not take regularly 24-Apr-2020: Taking 1 QPM  . donepezil (ARICEPT) 5 MG tablet Take 5 mg by mouth daily.   . furosemide (LASIX) 40 MG tablet Take 20 mg by mouth daily.    .  isosorbide mononitrate (IMDUR) 60 MG 24 hr tablet Take 60 mg by mouth daily.   . Multiple Vitamin (MULTIVITAMIN WITH MINERALS) TABS tablet Take 1 tablet by mouth daily. (Patient not taking: No sig reported)   . nitroGLYCERIN (NITROSTAT) 0.4 MG SL tablet Place 1 tablet (0.4 mg total) under the tongue every 5 (five) minutes as needed for chest pain.   Marland Kitchen ondansetron (ZOFRAN-ODT) 4 MG disintegrating tablet Take 1 tablet (4 mg total) by mouth every 8 (eight) hours as needed for nausea or vomiting. (Patient not taking: Reported on 09/02/2020)   . potassium chloride SA (KLOR-CON) 20 MEQ tablet Take 1 tablet (20 mEq total) by mouth daily.   . QUEtiapine (SEROQUEL XR) 50 MG TB24 24 hr tablet Take 1 tablet (50 mg total) by mouth at bedtime.   . rosuvastatin (CRESTOR) 20 MG tablet Take 1 tablet by mouth once daily   . sacubitril-valsartan (ENTRESTO) 49-51 MG Take 1 tablet by mouth 2 (two) times daily.    . sertraline (ZOLOFT) 100 MG tablet Take 2 tablets (200 mg total) by mouth daily.   . silver sulfADIAZINE (SILVADENE) 1 % cream Apply 1 application topically daily. (Patient taking differently: Apply 1 application topically as needed.)   . SPIRIVA HANDIHALER 18 MCG inhalation capsule PLACE 1 CAPSULE INTO INHALER AND INHALE DAILY (Patient not taking:  Reported on 09/02/2020)   . traZODone (DESYREL) 50 MG tablet TAKE 1/2 TO 1 (ONE-HALF TO ONE) TABLET BY MOUTH AT BEDTIME AS NEEDED FOR SLEEP   . triamcinolone ointment (KENALOG) 0.5 % Apply 1 application topically 2 (two) times daily. (Patient not taking: Reported on 09/02/2020)    No facility-administered encounter medications on file as of 09/16/2020.    Current Diagnosis: Patient Active Problem List   Diagnosis Date Noted  . Falls frequently 07/30/2020  . Bilateral hearing loss 06/26/2020  . Confusion 06/26/2020  . Dizziness 06/26/2020  . Hallucinations, visual 06/26/2020  . Hearing loss 06/26/2020  . Memory difficulty 06/26/2020  . Symptomatic carotid artery  narrowing without infarction 03/06/2020  . Symptomatic carotid artery stenosis without infarction 02/04/2020  . Acute respiratory failure with hypoxia and hypercarbia ([place]) 12/03/2019  . Acute on chronic respiratory failure with hypoxia ([place]) 05/15/2019  . Insomnia 05/05/2018  . Squamous cell carcinoma of arm, right 01/07/2017  . Depression, major, single episode, moderate ([place]) 12/01/2016  . Unstable angina ([place]) 11/26/2016  . Near syncope 03/11/2016  . Hypokalemia 03/11/2016  . Orthostatic hypotension 08/12/2015  . Hyperlipidemia 07/29/2015  . Acute systolic CHF (congestive heart failure) ([place]) 07/25/2015  . Hypertensive renal disease 07/25/2015  . Low back pain 07/25/2015  . Essential (primary) hypertension 07/25/2015  . Smoker 07/22/2015  . COPD (chronic obstructive pulmonary disease) ([place]) 07/22/2015  . Diabetes mellitus with chronic kidney disease ([place]) 07/22/2015  . CAD (coronary artery disease) 07/22/2015  . Type 2 diabetes mellitus with hyperglycemia ([place]) 07/22/2015    09-16-2020: Attempted to contact for enrollment into enhanced pharmacy services, [facility]. No answer. Left HIPAA compliant voicemail requesting a call back. Will continue to make contact with patient.   09-18-20: Second attempt to contact the patient. Again no answer. Left HIPPA compliant voicemail requesting a call back.   09-27-2020: Third attempt to contact the patient. No answer. Left HIPPA compliant voicemail requesting a call back.   Made PharmD Birdena Crandall aware of the failed attempts.   Cloretta Ned, LPN Clinical Pharmacist Assistant  [phone]    Follow-Up:  Coordination of Enhanced Pharmacy Services

## 2020-09-19 ENCOUNTER — Ambulatory Visit: Payer: Self-pay | Admitting: *Deleted

## 2020-09-19 NOTE — Telephone Encounter (Signed)
FYI scheduled virtual for tomorrow  

## 2020-09-19 NOTE — Chronic Care Management (AMB) (Signed)
  Care Management   Note  09/19/2020 Name: Sarah White MRN: [id number] DOB: 03-19-1946  Sarah White is a 75 y.o. year old female who is a primary care patient of Valerie Roys, DO and is actively engaged with the care management team. I reached out to [name] by phone today to assist with re-scheduling a follow up visit with the RN Case Manager  Follow up plan: Telephone appointment with care management team member scheduled for:11/01/2020  Noreene Larsson, [address], [place], [place] [address] Direct Dial: [phone] [email or web address]: [email or web address]

## 2020-09-19 NOTE — Chronic Care Management (AMB) (Signed)
  Care Management   Note  09/19/2020 Name: [name] MRN: [id number] DOB: Jan 03, 1946  Sarah White is a 76 y.o. year old female who is a primary care patient of Valerie Roys, DO and is actively engaged with the care management team. I reached out to [name] by phone today to assist with re-scheduling a follow up visit with the RN Case Manager  Follow up plan: Unsuccessful telephone outreach attempt made. A HIPAA compliant phone message was left for the patient providing contact information and requesting a return call.  The care management team will reach out to the patient again over the next 7 days.  If patient returns call to provider office, please advise to call [facility]  at [address], [place] [address], [place], [place] [address] Direct Dial: [phone] [email or web address]: [email or web address]

## 2020-09-19 NOTE — Telephone Encounter (Signed)
C/o black , watery stools since last week. Does reports using kaopectate for diarrhea without relief. Reports recent antibiotic use for UTI in the past. 3 watery BMs reported today. Can drink fluids without issue but when she eats food "goes through" her. Denies fever, dizziness, abdominal pain, signs of dehydration or blood in stool. Virtual visit scheduled for 09/23/20. Care advise given. Patient verbalized understanding of care advise and to call back or go to [facility] or ED if symptoms worsen.   Reason for Disposition . [1] MILD diarrhea (e.g., 1-3 or more stools than normal in past 24 hours) without known cause AND [2] present >  7 days  Answer Assessment - Initial Assessment Questions 1. DIARRHEA SEVERITY: "How bad is the diarrhea?" "How many extra stools have you had in the past 24 hours than normal?"    - NO DIARRHEA (SCALE 0)   - MILD (SCALE 1-3): Few loose or mushy BMs; increase of 1-3 stools over normal daily number of stools; mild increase in ostomy output.   -  MODERATE (SCALE 4-7): Increase of 4-6 stools daily over normal; moderate increase in ostomy output. * SEVERE (SCALE 8-10; OR 'WORST POSSIBLE'): Increase of 7 or more stools daily over normal; moderate increase in ostomy output; incontinence.     Mild, 3 times water 2. ONSET: "When did the diarrhea begin?"      Last week  3. BM CONSISTENCY: "How loose or watery is the diarrhea?"      Watery and black  4. VOMITING: "Are you also vomiting?" If Yes, ask: "How many times in the past 24 hours?"      No  5. ABDOMINAL PAIN: "Are you having any abdominal pain?" If Yes, ask: "What does it feel like?" (e.g., crampy, dull, intermittent, constant)      No  6. ABDOMINAL PAIN SEVERITY: If present, ask: "How bad is the pain?"  (e.g., Scale 1-10; mild, moderate, or severe)   - MILD (1-3): doesn't interfere with normal activities, abdomen soft and not tender to touch    - MODERATE (4-7): interferes with normal activities or awakens from sleep,  tender to touch    - SEVERE (8-10): excruciating pain, doubled over, unable to do any normal activities       Na  7. ORAL INTAKE: If vomiting, "Have you been able to drink liquids?" "How much fluids have you had in the past 24 hours?"     Able to drink fluids  8. HYDRATION: "Any signs of dehydration?" (e.g., dry mouth [not just dry lips], too weak to stand, dizziness, new weight loss) "When did you last urinate?"     No  9. EXPOSURE: "Have you traveled to a foreign country recently?" "Have you been exposed to anyone with diarrhea?" "Could you have eaten any food that was spoiled?"     no 10. ANTIBIOTIC USE: "Are you taking antibiotics now or have you taken antibiotics in the past 2 months?"       Yes for UTI ampicillin 11. OTHER SYMPTOMS: "Do you have any other symptoms?" (e.g., fever, blood in stool)       Stools are black  12. PREGNANCY: "Is there any chance you are pregnant?" "When was your last menstrual period?"       na  Protocols used: [facility]

## 2020-09-19 NOTE — Telephone Encounter (Signed)
FYI scheduled virtual for tomorrow

## 2020-09-20 ENCOUNTER — Ambulatory Visit (INDEPENDENT_AMBULATORY_CARE_PROVIDER_SITE_OTHER): Payer: Medicare Other | Admitting: Family Medicine

## 2020-09-20 ENCOUNTER — Encounter: Payer: Self-pay | Admitting: Family Medicine

## 2020-09-20 VITALS — BP 192/91 | HR 77 | Temp 98.9°F

## 2020-09-20 DIAGNOSIS — R197 Diarrhea, unspecified: Secondary | ICD-10-CM

## 2020-09-20 MED ORDER — METRONIDAZOLE 500 MG PO TABS: 500.0000 mg | ORAL_TABLET | Freq: Two times a day (BID) | ORAL | 0 refills | Status: DC

## 2020-09-20 NOTE — Progress Notes (Signed)
BP (!) 192/91   Pulse 77   Temp 98.9 F (37.2 C)   SpO2 96%    Subjective:    [name], female    DOB: [birth year], 75 y.o.   MRN: [id number]  HPI: Sarah White is a 75 y.o. female  Chief Complaint  Patient presents with  . Diarrhea    Black stool, mostly liquid    Has been having black diarrhea for about a week. It's really stinky. Its not having her have cramping. She is having bowel urgency. No nausea, no fevers. She is not feeling more tired than usual. She has been on several antibiotics recently for UTIs.  Duration: 1 week Nature: no pain Location: diffuse  Severity: moderate  Constipation: no Diarrhea: yes Episodes of diarrhea/day: several Mucous in the stool: no Heartburn: no Bloating:no Flatulence: yes Nausea: no Vomiting: no Melena or hematochezia: yes Rash: no Jaundice: no Fever: no Weight loss: no  Relevant past medical, surgical, family and social history reviewed and updated as indicated. Interim medical history since our last visit reviewed. Allergies and medications reviewed and updated.  Review of Systems  Constitutional: Negative.   HENT: Negative.   Respiratory: Negative.   Cardiovascular: Negative.   Gastrointestinal: Positive for diarrhea. Negative for abdominal distention, abdominal pain, anal bleeding, blood in stool, constipation, nausea, rectal pain and vomiting.  Musculoskeletal: Negative.   Neurological: Negative.   Psychiatric/Behavioral: Negative.     Per HPI unless specifically indicated above     Objective:    BP (!) 192/91   Pulse 77   Temp 98.9 F (37.2 C)   SpO2 96%   Wt Readings from Last 3 Encounters:  08/28/20 160 lb 3.2 oz (72.7 kg)  08/07/20 156 lb 9.6 oz (71 kg)  08/05/20 156 lb 12.8 oz (71.1 kg)    Physical Exam Vitals and nursing note reviewed.  Pulmonary:     Effort: Pulmonary effort is normal. No respiratory distress.     Comments: Speaking in full sentences Neurological:      Mental Status: She is alert.  Psychiatric:        Mood and Affect: Mood normal.        Behavior: Behavior normal.        Thought Content: Thought content normal.        Judgment: Judgment normal.     Results for orders placed or performed in visit on 08/28/20  Urine Culture   Specimen: Urine   UR  Result Value Ref Range   Urine Culture, Routine Final report    Organism ID, Bacteria Comment   Microscopic Examination   Urine  Result Value Ref Range   WBC, UA 6-10 (A) 0 - 5 /hpf   RBC 0-2 0 - 2 /hpf   Epithelial Cells (non renal) 0-10 0 - 10 /hpf   Bacteria, UA Few (A) None seen/Few   Yeast, UA Present None seen  Urinalysis, Routine w reflex microscopic  Result Value Ref Range   Specific Gravity, UA 1.020 1.005 - 1.030   pH, UA 6.0 5.0 - 7.5   Color, UA Yellow Yellow   Appearance Ur Hazy (A) Clear   Leukocytes,UA 1+ (A) Negative   Protein,UA Trace (A) Negative/Trace   Glucose, UA Negative Negative   Ketones, UA Negative Negative   RBC, UA Trace (A) Negative   Bilirubin, UA Negative Negative   Urobilinogen, Ur 0.2 0.2 - 1.0 mg/dL   Nitrite, UA Negative Negative   Microscopic Examination  See below:       Assessment & Plan:   Problem List Items Addressed This Visit   None   Visit Diagnoses    Diarrhea of presumed infectious origin    -  Primary   Concern for c diff. Will treat with flagyl and get labs and stool studies Monday. Await results. Recheck next week.    Relevant Orders   Fecal occult blood, imunochemical([facility])   Stool C-Diff Toxin Assay   Ova and parasite examination   Stool Culture   Fecal leukocytes   CBC with Differential/Platelet   Comprehensive metabolic panel       Follow up plan: Return next week in person.    . This visit was completed via telephone due to the restrictions of the COVID-19 pandemic. All issues as above were discussed and addressed but no physical exam was performed. If it was felt that the patient should be  evaluated in the office, they were directed there. The patient verbally consented to this visit. Patient was unable to complete an audio/visual visit due to Lack of equipment. Due to the catastrophic nature of the COVID-19 pandemic, this visit was done through audio contact only. . Location of the patient: home . Location of the provider: home . Those involved with this call:  . Provider: Park Liter, DO . CMA: Tiffany Reel, CMA . Front Desk/Registration: Jill Side  . Time spent on call: 21 minutes on the phone discussing health concerns. 30 minutes total spent in review of patient's record and preparation of their chart.

## 2020-09-20 NOTE — Patient Instructions (Signed)
https://www.cdc.gov/cdiff/index.html">  Clostridioides Difficile Infection Clostridioides difficile infection, also known as C. difficile or C. diff infection, happens when too much C. diff bacteria grows. This can cause severe diarrhea and inflammation of the colon (colitis). It is linked to recent use of antibiotic medicine. This infection can be passed from person to person (is contagious). You also may be exposed to the bacteria from contact with food, water, or surfaces that have the bacteria on them. What are the causes? Certain bacteria live in the colon and help to digest food. This infection develops when the balance of helpful bacteria in the colon changes and the C. diff bacteria grow out of control. This is often caused by taking antibiotics. What increases the risk? You may be more likely to develop this condition if you:  Take certain antibiotics that kill many types of bacteria or take antibiotics for a long time.  Have an extended stay in a hospital or long-term care facility.  Are older than age 32.  Have had a C. diff infection before or have been exposed to C. diff bacteria.  Have a weakened disease-fighting system (immune system).  Take a medicine to reduce stomach acid, such as a proton pump inhibitor, for a long time.  Have a serious underlying condition, such as colon cancer or inflammatory bowel disease (IBD).  Have had a gastrointestinal (GI) tract procedure. What are the signs or symptoms? Symptoms of this condition include:  Diarrhea (three or more times a day) for several days.  Fever.  Loss of appetite.  Nausea.  Swelling, pain, cramping, or tenderness in the abdomen. How is this diagnosed? This condition is diagnosed with:  Your medical history and a physical exam.  Tests, which may include: ? A test for C. diff in your stool (feces). ? Blood tests. ? Imaging tests, such as a CT scan of your abdomen.  A procedure in which your colon is  examined. This is rare. How is this treated? Treatment for this condition may include:  Stopping the antibiotics that you were taking when the C. diff infection began. Do this only as told by your health care provider.  Taking certain antibiotics to stop C. diff growth.  Taking stool from a healthy person and placing it into your colon (fecal transplant). This may be done if the infection keeps coming back.  Having surgery to remove the infected part of the colon. This is rare. Follow these instructions at home: Medicines  Take over-the-counter and prescription medicines only as told by your health care provider.  Take antibiotic medicine as told by your health care provider. Do not stop taking the antibiotic even if you start to feel better.  Do not treat diarrhea with medicines unless your health care provider tells you to. Eating and drinking  Follow instructions from your health care provider about eating and drinking restrictions.  Eat bland foods in small amounts that are easy to digest. These include bananas, applesauce, rice, lean meats, toast, and crackers.  Follow instructions on replacing body fluid that has been lost (rehydrate). This may include: ? Drinking clear fluids, such as water, clear fruit juice that is diluted with water, and low-calorie sports drinks. ? Sucking on ice chips. ? Taking an oral rehydration solution (ORS). This drink is sold at pharmacies and retail stores.  Avoid milk, caffeine, and alcohol.  Drink enough fluid to keep your urine pale yellow.   General instructions  Wash your hands often with soap and water for at least  20 seconds. Bathe or shower using soap and water daily.  Return to your normal activities as told by your health care provider. Ask your health care provider what activities are safe for you.  Be sure your home is clean before you leave the hospital or clinic to go home. Then continue daily cleaning for at least a  week.  Keep all follow-up visits. This is important. How is this prevented? Hand hygiene  Wash your hands with soap and water for at least 20 seconds before preparing food and after using the bathroom. Make sure the people you live with also wash their hands often with soap and water for at least 20 seconds.  If you are being treated at a hospital or clinic, make sure that all health care providers and visitors wash their hands with soap and water before touching you.   Contact precautions  Tell your health care team right away if you develop diarrhea while in a hospital or long-term care facility.  When visiting someone in a hospital or a long-term care facility, follow guidelines for wearing a gown, gloves, or other protective equipment.  If possible, avoid contact with people who have diarrhea.  Use a separate bathroom if you are sick and live with other people, if possible. Clean environment  Clean surfaces that are touched often every day. C. diff bacteria are killed only by cleaning products that contain 10% chlorine bleach solution. Be sure to: ? Read the product's label to make sure the product will kill the bacteria on the surface you are cleaning. ? Clean frequently touched surfaces, such as toilet seats and flush handles, bathtubs, sinks, doorknobs, and work surfaces.  If you are in the hospital, make sure that staff members clean the surfaces in your room daily. Let a staff person know right away if body fluids have splashed or spilled. Washing clothes and linens  Use a powder laundry detergent containing chlorine bleach instead of liquid detergent. Powder detergents contain chlorine bleach in low levels to help kill bacteria.  Run your empty washing machine on the hot setting once a month with enough detergent for a full load. This will kill any remaining C. diff bacteria. Contact a health care provider if:  Your symptoms do not get better, or they get worse, even with  treatment.  Your symptoms go away and then come back.  You have a fever.  You develop new symptoms. Get help right away if:  You have more pain or tenderness in your abdomen.  You have stool that is mostly bloody, or looks black and tarry.  You cannot eat or drink without vomiting.  You have signs of dehydration, such as: ? Dark urine, very little urine, or no urine. ? Cracked lips or dry mouth. ? Not making tears when you cry. ? Sunken eyes. ? Sleepiness. ? Weakness or dizziness. Summary  Clostridioides difficile infection, or C. diff infection, can cause severe diarrhea and inflammation of the colon (colitis). It is linked to recent antibiotic use.  C. diff infection can spread from person to person (is contagious). You also may be exposed to the bacteria from contact with food, water, or surfaces that have the bacteria on them.  This infection may be treated by stopping the antibiotics you were using when the infection began. Fecal transplant or surgery may be needed for repeat or severe infections.  Washing hands with soap and water for at least 20 seconds after you use the bathroom and before you  eat, and cleaning surfaces with a 10% bleach solution, can help prevent or limit spread of this infection. This information is not intended to replace advice given to you by your health care provider. Make sure you discuss any questions you have with your health care provider. Document Revised: 12/07/2019 Document Reviewed: 12/07/2019 Elsevier Patient Education  2021 [place] Test Why am I having this test? This test is performed to check for Clostridioides difficile, also known as C. difficile or C. diff, bacteria in your stool. When you have a C. diff infection, toxins made by these bacteria can cause damage to the lining of your colon and may lead to colitis. Your health care provider may recommend this test if you have had diarrhea and have been  taking antibiotic medicine for more than 5 days. The test may also be done if you have a weakened body defense system (immune system) and you develop diarrhea, even if you are not taking antibiotics. What is being tested? This test checks your stool for the presence of toxins made by C. diffbacteria. What kind of sample is taken? A sample of liquid or unformed stool is required for this test. A sample may be collected by:  Using a germ-free (sterile) container at home that is given to you by the lab.  Procedures such as a proctoscopy or colonoscopy. A stool sample is collected during the exam.  Using an incontinence pad.   How do I collect samples at home? When collecting a stool sample at home, make sure you:  Use supplies and instructions that you received from the lab.  Have a bowel movement directly into a clean, dry container. ? Do not let any toilet paper or urine get into the container. ? Do not collect stool from the water in the toilet.  Wear clean gloves.  Transfer the sample into the sterile cup that you received from the lab.  Remove gloves. Wash your hands thoroughly with soap and water for at least 20 seconds after you use the bathroom.  Refrigerate the sample if it cannot be taken to the lab right away.  Take the sample to the lab as instructed. How do I prepare for this test? There is no preparation required for this test. If you are asked to collect a stool sample at home, your health care provider or lab will give you the supplies and instructions you need. How are the results reported?  Your test results will be reported as either positive or negative.  Occasionally, test results may be incorrect. This means: ? A false-positive result can occur. This means that a result shows that a condition is present when it is not. ? A false-negative result can occur. This means that a result shows that a condition is not present when it actually is. Your health care  provider will talk to you about doing more tests to confirm your results. What do the results mean?  A negative test result means that there was no C. diff toxin identified in your stool.  A positive test result means that C. diff toxin was identified in your stool. A positive test may mean that you have C. diff infection and possible colitis. Talk with your health care provider about what your test results mean. Questions to ask your health care provider Ask your health care provider, or the department that is doing the test:  When will my results be ready?  How will I get my results?  What are my treatment options?  What other tests do I need?  What are my next steps? Summary  The Clostridioides difficile, also known as C. difficile or C. diff,  test is done to check your stool for the presence of toxins made by the C. diff bacteria. These bacteria can damage the lining of your colon and may lead to colitis.  You may be asked to collect a stool sample at home. Follow instructions and use the sterile container given by the lab or your health care provider.  Results will be reported as either positive or negative. Positive results mean that the C. diff toxins are present in your stool. Negative results mean that the toxins are not present in your stool.  If you have a positive result, ask your health care provider about your treatment options and about your next steps. This information is not intended to replace advice given to you by your health care provider. Make sure you discuss any questions you have with your health care provider. Document Revised: 12/07/2019 Document Reviewed: 12/07/2019 Elsevier Patient Education  [place].

## 2020-09-22 ENCOUNTER — Encounter: Payer: Self-pay | Admitting: Family Medicine

## 2020-09-23 ENCOUNTER — Telehealth: Payer: Medicare Other | Admitting: Family Medicine

## 2020-09-24 ENCOUNTER — Other Ambulatory Visit: Payer: Self-pay

## 2020-09-24 ENCOUNTER — Other Ambulatory Visit: Payer: Medicare Other

## 2020-09-24 DIAGNOSIS — R197 Diarrhea, unspecified: Secondary | ICD-10-CM

## 2020-09-25 LAB — CBC WITH DIFFERENTIAL/PLATELET
Basophils Absolute: 0 10*3/uL (ref 0.0–0.2)
Basos: 0 %
EOS (ABSOLUTE): 0.1 10*3/uL (ref 0.0–0.4)
Eos: 1 %
Hematocrit: 41.7 % (ref 34.0–46.6)
Hemoglobin: 13.8 g/dL (ref 11.1–15.9)
Immature Grans (Abs): 0 10*3/uL (ref 0.0–0.1)
Immature Granulocytes: 0 %
Lymphocytes Absolute: 0.8 10*3/uL (ref 0.7–3.1)
Lymphs: 18 %
MCH: 28.6 pg (ref 26.6–33.0)
MCHC: 33.1 g/dL (ref 31.5–35.7)
MCV: 87 fL (ref 79–97)
Monocytes Absolute: 0.3 10*3/uL (ref 0.1–0.9)
Monocytes: 7 %
Neutrophils Absolute: 3.4 10*3/uL (ref 1.4–7.0)
Neutrophils: 74 %
Platelets: 112 10*3/uL — ABNORMAL LOW (ref 150–450)
RBC: 4.82 x10E6/uL (ref 3.77–5.28)
RDW: 14.4 % (ref 11.7–15.4)
WBC: 4.6 10*3/uL (ref 3.4–10.8)

## 2020-09-25 LAB — COMPREHENSIVE METABOLIC PANEL
ALT: 13 IU/L (ref 0–32)
AST: 20 IU/L (ref 0–40)
Albumin/Globulin Ratio: 2.3 — ABNORMAL HIGH (ref 1.2–2.2)
Albumin: 4.3 g/dL (ref 3.7–4.7)
Alkaline Phosphatase: 101 IU/L (ref 44–121)
BUN/Creatinine Ratio: 12 (ref 12–28)
BUN: 10 mg/dL (ref 8–27)
Bilirubin Total: <0.2 mg/dL (ref 0.0–1.2)
CO2: 21 mmol/L (ref 20–29)
Calcium: 9.2 mg/dL (ref 8.7–10.3)
Chloride: 106 mmol/L (ref 96–106)
Creatinine, Ser: 0.82 mg/dL (ref 0.57–1.00)
GFR calc Af Amer: 83 mL/min/{1.73_m2} (ref >59)
GFR calc non Af Amer: 72 mL/min/{1.73_m2} (ref >59)
Globulin, Total: 1.9 g/dL (ref 1.5–4.5)
Glucose: 115 mg/dL — ABNORMAL HIGH (ref 65–99)
Potassium: 4 mmol/L (ref 3.5–5.2)
Sodium: 146 mmol/L — ABNORMAL HIGH (ref 134–144)
Total Protein: 6.2 g/dL (ref 6.0–8.5)

## 2020-09-26 ENCOUNTER — Other Ambulatory Visit: Payer: Self-pay | Admitting: Family Medicine

## 2020-09-30 DIAGNOSIS — R410 Disorientation, unspecified: Secondary | ICD-10-CM | POA: Diagnosis not present

## 2020-09-30 DIAGNOSIS — R441 Visual hallucinations: Secondary | ICD-10-CM | POA: Diagnosis not present

## 2020-09-30 DIAGNOSIS — R42 Dizziness and giddiness: Secondary | ICD-10-CM | POA: Diagnosis not present

## 2020-09-30 DIAGNOSIS — R296 Repeated falls: Secondary | ICD-10-CM | POA: Diagnosis not present

## 2020-09-30 DIAGNOSIS — R413 Other amnesia: Secondary | ICD-10-CM | POA: Diagnosis not present

## 2020-10-03 ENCOUNTER — Encounter: Payer: Self-pay | Admitting: Family Medicine

## 2020-10-03 ENCOUNTER — Ambulatory Visit (INDEPENDENT_AMBULATORY_CARE_PROVIDER_SITE_OTHER): Payer: Medicare Other | Admitting: Family Medicine

## 2020-10-03 ENCOUNTER — Other Ambulatory Visit: Payer: Self-pay

## 2020-10-03 VITALS — BP 138/64 | HR 65 | Temp 98.5°F | Wt 159.0 lb

## 2020-10-03 DIAGNOSIS — I2510 Atherosclerotic heart disease of native coronary artery without angina pectoris: Secondary | ICD-10-CM

## 2020-10-03 DIAGNOSIS — R197 Diarrhea, unspecified: Secondary | ICD-10-CM | POA: Diagnosis not present

## 2020-10-03 DIAGNOSIS — I10 Essential (primary) hypertension: Secondary | ICD-10-CM | POA: Diagnosis not present

## 2020-10-03 DIAGNOSIS — E782 Mixed hyperlipidemia: Secondary | ICD-10-CM | POA: Diagnosis not present

## 2020-10-03 DIAGNOSIS — F321 Major depressive disorder, single episode, moderate: Secondary | ICD-10-CM | POA: Diagnosis not present

## 2020-10-03 DIAGNOSIS — I129 Hypertensive chronic kidney disease with stage 1 through stage 4 chronic kidney disease, or unspecified chronic kidney disease: Secondary | ICD-10-CM

## 2020-10-03 MED ORDER — ALBUTEROL SULFATE 0.63 MG/3ML IN NEBU: 1.0000 | INHALATION_SOLUTION | Freq: Four times a day (QID) | RESPIRATORY_TRACT | 6 refills | Status: DC | PRN

## 2020-10-03 NOTE — Progress Notes (Signed)
BP 138/64   Pulse 65   Temp 98.5 F (36.9 C)   Wt 159 lb (72.1 kg)   SpO2 95%   BMI 27.29 kg/m    Subjective:    [name], female    DOB: [birth year], 75 y.o.   MRN: [id number]  HPI: Sarah White is a 75 y.o. female  Chief Complaint  Patient presents with  . Diarrhea    Patient states she is doing better   Sarah White notes that she is feeling much better. She has not had any diarrhea since she stopped drinking grape juice. She has not noticed any belly pain. No fevers. No chills.   DEPRESSION Mood status: controlled Satisfied with current treatment?: yes Symptom severity: moderate  Duration of current treatment : chronic Side effects: no Medication compliance: excellent compliance Psychotherapy/counseling: no  Depressed mood: yes Anxious mood: no Anhedonia: no Significant weight loss or gain: no Insomnia: no  Fatigue: yes Feelings of worthlessness or guilt: no Impaired concentration/indecisiveness: no Suicidal ideations: no Hopelessness: no Crying spells: no Depression screen [facility] 2/9 09/20/2020 05/17/2020 04/22/2020 04/21/2020 01/19/2020  Decreased Interest 0 1 3 1  0  Down, Depressed, Hopeless 0 1 2 1  0  PHQ - 2 Score 0 2 5 2  0  Altered sleeping 3 2 0 0 0  Tired, decreased energy 0 - 0 2 0  Change in appetite 3 3 0 1 0  Feeling bad or failure about yourself  3 0 0 2 0  Trouble concentrating 0 2 0 0 0  Moving slowly or fidgety/restless 0 0 0 0 0  Suicidal thoughts 0 0 0 0 0  PHQ-9 Score 9 9 5 7  0  Difficult doing work/chores Not difficult at all Not difficult at all Not difficult at all Not difficult at all Not difficult at all  Some recent data might be hidden   HYPERTENSION / HYPERLIPIDEMIA Satisfied with current treatment? yes Duration of hypertension: chronic BP monitoring frequency: not checking BP medication side effects: no Duration of hyperlipidemia: chronic Medication compliance: excellent compliance Aspirin: yes Recent stressors:  no Recurrent headaches: no Visual changes: no Palpitations: no Dyspnea: no Chest pain: no Lower extremity edema: no Dizzy/lightheaded: no   Relevant past medical, surgical, family and social history reviewed and updated as indicated. Interim medical history since our last visit reviewed. Allergies and medications reviewed and updated.  Review of Systems  Constitutional: Negative.   Respiratory: Negative.   Cardiovascular: Negative.   Gastrointestinal: Negative.   Musculoskeletal: Negative.   Neurological: Negative.   Psychiatric/Behavioral: Negative.     Per HPI unless specifically indicated above     Objective:    BP 138/64   Pulse 65   Temp 98.5 F (36.9 C)   Wt 159 lb (72.1 kg)   SpO2 95%   BMI 27.29 kg/m   Wt Readings from Last 3 Encounters:  10/03/20 159 lb (72.1 kg)  08/28/20 160 lb 3.2 oz (72.7 kg)  08/07/20 156 lb 9.6 oz (71 kg)    Physical Exam Vitals and nursing note reviewed.  Constitutional:      General: She is not in acute distress.    Appearance: Normal appearance. She is not ill-appearing, toxic-appearing or diaphoretic.  HENT:     Head: Normocephalic and atraumatic.     Right Ear: External ear normal.     Left Ear: External ear normal.     Nose: Nose normal.     Mouth/Throat:     Mouth: Mucous  membranes are moist.     Pharynx: Oropharynx is clear.  Eyes:     General: No scleral icterus.       Right eye: No discharge.        Left eye: No discharge.     Extraocular Movements: Extraocular movements intact.     Conjunctiva/sclera: Conjunctivae normal.     Pupils: Pupils are equal, round, and reactive to light.  Cardiovascular:     Rate and Rhythm: Normal rate and regular rhythm.     Pulses: Normal pulses.     Heart sounds: Normal heart sounds. No murmur heard. No friction rub. No gallop.   Pulmonary:     Effort: Pulmonary effort is normal. No respiratory distress.     Breath sounds: Normal breath sounds. No stridor. No wheezing, rhonchi  or rales.  Chest:     Chest wall: No tenderness.  Musculoskeletal:        General: Normal range of motion.     Cervical back: Normal range of motion and neck supple.  Skin:    General: Skin is warm and dry.     Capillary Refill: Capillary refill takes less than 2 seconds.     Coloration: Skin is not jaundiced or pale.     Findings: No bruising, erythema, lesion or rash.  Neurological:     General: No focal deficit present.     Mental Status: She is alert and oriented to person, place, and time. Mental status is at baseline.  Psychiatric:        Mood and Affect: Mood normal.        Behavior: Behavior normal.        Thought Content: Thought content normal.        Judgment: Judgment normal.     Results for orders placed or performed in visit on 09/24/20  Fecal leukocytes   Specimen: Stool   ST     CD- [id number] V  Result Value Ref Range   White Blood Cells (WBC), Stool WILL FOLLOW   Stool Culture   Specimen: Stool   ST     CD- [id number] V  Result Value Ref Range   Salmonella/Shigella Screen Final report    Stool Culture result 1 (RSASHR) Comment    Campylobacter Culture Preliminary report    Stool Culture result 1 (CMPCXR) Comment    E coli, Shiga toxin Assay Negative Negative  Fecal occult blood, imunochemical([facility])   Specimen: Stool   ST  Result Value Ref Range   Fecal Occult Bld Positive (A) Negative  Clostridium difficile EIA   ST     CD- [id number] V  Result Value Ref Range   C difficile Toxins A+B, EIA Negative Negative  Ova and parasite examination   ST     CD- [id number] V  Result Value Ref Range   OVA + PARASITE EXAM WILL FOLLOW   Comprehensive metabolic panel  Result Value Ref Range   Glucose 115 (H) 65 - 99 mg/dL   BUN 10 8 - 27 mg/dL   Creatinine, Ser 0.82 0.57 - 1.00 mg/dL   GFR calc non Af Amer 72 >59 mL/min/1.73   GFR calc Af Amer 83 >59 mL/min/1.73   BUN/Creatinine Ratio 12 12 - 28   Sodium 146 (H) 134 - 144 mmol/L   Potassium 4.0 3.5 -  5.2 mmol/L   Chloride 106 96 - 106 mmol/L   CO2 21 20 - 29 mmol/L   Calcium 9.2 8.7 - 10.3 mg/dL   Total Protein 6.2 6.0 -  8.5 g/dL   Albumin 4.3 3.7 - 4.7 g/dL   Globulin, Total 1.9 1.5 - 4.5 g/dL   Albumin/Globulin Ratio 2.3 (H) 1.2 - 2.2   Bilirubin Total <0.2 0.0 - 1.2 mg/dL   Alkaline Phosphatase 101 44 - 121 IU/L   AST 20 0 - 40 IU/L   ALT 13 0 - 32 IU/L  CBC with Differential/Platelet  Result Value Ref Range   WBC 4.6 3.4 - 10.8 x10E3/uL   RBC 4.82 3.77 - 5.28 x10E6/uL   Hemoglobin 13.8 11.1 - 15.9 g/dL   Hematocrit 41.7 34.0 - 46.6 %   MCV 87 79 - 97 fL   MCH 28.6 26.6 - 33.0 pg   MCHC 33.1 31.5 - 35.7 g/dL   RDW 14.4 11.7 - 15.4 %   Platelets 112 (L) 150 - 450 x10E3/uL   Neutrophils 74 Not Estab. %   Lymphs 18 Not Estab. %   Monocytes 7 Not Estab. %   Eos 1 Not Estab. %   Basos 0 Not Estab. %   Neutrophils Absolute 3.4 1.4 - 7.0 x10E3/uL   Lymphocytes Absolute 0.8 0.7 - 3.1 x10E3/uL   Monocytes Absolute 0.3 0.1 - 0.9 x10E3/uL   EOS (ABSOLUTE) 0.1 0.0 - 0.4 x10E3/uL   Basophils Absolute 0.0 0.0 - 0.2 x10E3/uL   Immature Granulocytes 0 Not Estab. %   Immature Grans (Abs) 0.0 0.0 - 0.1 x10E3/uL      Assessment & Plan:   Problem List Items Addressed This Visit      Cardiovascular and Mediastinum   Essential (primary) hypertension    Under good control on current regimen. Continue current regimen. Continue to monitor. Call with any concerns. Refills given.        Relevant Medications   rosuvastatin (CRESTOR) 20 MG tablet   carvedilol (COREG) 12.5 MG tablet     Genitourinary   Hypertensive renal disease    Under good control on current regimen. Continue current regimen. Continue to monitor. Call with any concerns. Refills given.         Other   Hyperlipidemia    Under good control on current regimen. Continue current regimen. Continue to monitor. Call with any concerns. Refills given.       Relevant Medications   rosuvastatin (CRESTOR) 20 MG tablet    carvedilol (COREG) 12.5 MG tablet   Depression, major, single episode, moderate (HCC)    Under good control on current regimen. Continue current regimen. Continue to monitor. Call with any concerns. Refills given.       Relevant Medications   sertraline (ZOLOFT) 100 MG tablet   buPROPion (WELLBUTRIN SR) 150 MG 12 hr tablet    Other Visit Diagnoses    Diarrhea of presumed infectious origin    -  Primary   Diarrhea has resolved. Will await stool studies. Call with any concerns. Conitnue to monitor.        Follow up plan: Return in about 3 months (around 12/31/2020).

## 2020-10-04 LAB — FECAL OCCULT BLOOD, IMMUNOCHEMICAL: Fecal Occult Bld: POSITIVE — AB

## 2020-10-06 ENCOUNTER — Encounter: Payer: Self-pay | Admitting: Family Medicine

## 2020-10-06 MED ORDER — CARVEDILOL 12.5 MG PO TABS: ORAL_TABLET | ORAL | 1 refills | Status: DC

## 2020-10-06 MED ORDER — SPIRIVA HANDIHALER 18 MCG IN CAPS: ORAL_CAPSULE | RESPIRATORY_TRACT | 1 refills | Status: DC

## 2020-10-06 MED ORDER — POTASSIUM CHLORIDE CRYS ER 20 MEQ PO TBCR: 20.0000 meq | EXTENDED_RELEASE_TABLET | Freq: Every day | ORAL | 1 refills | Status: DC

## 2020-10-06 MED ORDER — BUPROPION HCL ER (SR) 150 MG PO TB12: 150.0000 mg | ORAL_TABLET | Freq: Two times a day (BID) | ORAL | 1 refills | Status: DC

## 2020-10-06 MED ORDER — QUETIAPINE FUMARATE ER 50 MG PO TB24: 50.0000 mg | ORAL_TABLET | Freq: Every day | ORAL | 1 refills | Status: DC

## 2020-10-06 MED ORDER — ROSUVASTATIN CALCIUM 20 MG PO TABS: 20.0000 mg | ORAL_TABLET | Freq: Every day | ORAL | 1 refills | Status: DC

## 2020-10-06 MED ORDER — SERTRALINE HCL 100 MG PO TABS: 200.0000 mg | ORAL_TABLET | Freq: Every day | ORAL | 1 refills | Status: DC

## 2020-10-06 NOTE — Assessment & Plan Note (Signed)
Under good control on current regimen. Continue current regimen. Continue to monitor. Call with any concerns. Refills given.   

## 2020-10-07 ENCOUNTER — Other Ambulatory Visit: Payer: Self-pay | Admitting: Family Medicine

## 2020-10-07 NOTE — Telephone Encounter (Signed)
Called and spoke with patient, she states that it is not her head that is bothering her it is her back and hip. I explained to her that she needed to go to the ER per Dr.Johnson's recommendations, she satets that there is no way that she can get into the car in the shape she is in. I told her to call EMS if she is unable to get to the hospital, that they can transport her, due to her not being able to get around.   Patient agreed to call EMS.

## 2020-10-07 NOTE — Telephone Encounter (Signed)
Can a new prescription be sent with the diagnosis code on it, requirement for medicare

## 2020-10-07 NOTE — Telephone Encounter (Signed)
Pharmacy requesting a diagnosis code for refill of Accuneb nebulizer solution sent on 10/03/20.

## 2020-10-07 NOTE — Telephone Encounter (Signed)
Called patient, no answer, left a message for patient to return my call. Will try again.

## 2020-10-07 NOTE — Telephone Encounter (Signed)
Sarah White called and needs the albuterol (ACCUNEB) 0.63 MG/3ML nebulizer solution Re sent with a dx code attached to it.  Sarah White (N), [place] [facility] [place]

## 2020-10-08 LAB — FECAL LEUKOCYTES

## 2020-10-08 LAB — STOOL CULTURE: E coli, Shiga toxin Assay: NEGATIVE

## 2020-10-08 LAB — CLOSTRIDIUM DIFFICILE EIA: C difficile Toxins A+B, EIA: NEGATIVE

## 2020-10-08 LAB — OVA AND PARASITE EXAMINATION

## 2020-10-09 LAB — OVA AND PARASITE EXAMINATION

## 2020-10-09 LAB — FECAL LEUKOCYTES

## 2020-10-12 MED ORDER — ALBUTEROL SULFATE 0.63 MG/3ML IN NEBU: 1.0000 | INHALATION_SOLUTION | Freq: Four times a day (QID) | RESPIRATORY_TRACT | 6 refills | Status: DC | PRN

## 2020-10-30 ENCOUNTER — Telehealth: Payer: Medicare Other

## 2020-11-01 ENCOUNTER — Telehealth: Payer: Medicare Other

## 2020-11-01 ENCOUNTER — Ambulatory Visit (INDEPENDENT_AMBULATORY_CARE_PROVIDER_SITE_OTHER): Payer: Medicare Other | Admitting: General Practice

## 2020-11-01 DIAGNOSIS — E782 Mixed hyperlipidemia: Secondary | ICD-10-CM | POA: Diagnosis not present

## 2020-11-01 DIAGNOSIS — I5021 Acute systolic (congestive) heart failure: Secondary | ICD-10-CM | POA: Diagnosis not present

## 2020-11-01 DIAGNOSIS — I10 Essential (primary) hypertension: Secondary | ICD-10-CM | POA: Diagnosis not present

## 2020-11-01 DIAGNOSIS — I2510 Atherosclerotic heart disease of native coronary artery without angina pectoris: Secondary | ICD-10-CM | POA: Diagnosis not present

## 2020-11-01 DIAGNOSIS — R2689 Other abnormalities of gait and mobility: Secondary | ICD-10-CM

## 2020-11-01 DIAGNOSIS — R296 Repeated falls: Secondary | ICD-10-CM

## 2020-11-01 NOTE — Patient Instructions (Signed)
Visit Information  PATIENT GOALS: Goals Addressed              This Visit's Progress     Prevent Falls and Injury        Follow Up Date 01-03-2021    - add more outdoor lighting - always use handrails on the stairs - always wear low-heeled or flat shoes or slippers with nonskid soles - call the doctor if I am feeling too drowsy - install bathroom grab bars - keep a flashlight by the bed - keep my cell phone with me always - learn how to get back up if I fall - make an emergency alert plan in case I fall - pick up clutter from the floors - use a nonslip pad with throw rugs, or remove them completely - use a cane or walker - use a nightlight in the bathroom - wear my glasses and/or hearing aid    Why is this important?    Most falls happen when it is hard for you to walk safely. Your balance may be off because of an illness. You may have pain in your knees, hip or other joints.   You may be overly tired or taking medicines that make you sleepy. You may not be able to see or hear clearly.   Falls can lead to broken bones, bruises or other injuries.   There are things you can do to help prevent falling.     Notes: Last fall on 10-07-2020      COMPLETED: RNCM: Pt- "I have had 2 bad falls recently" (pt-stated)        [place] (see longitudinal plan of care for additional care plan information)  Current Barriers: Closing this goal and opening in new ELS  Knowledge Deficits related to fall precautions in patient with multiple chronic conditions including HF, orthostatic hypotension, CAD, DM, and depression  Decreased adherence to prescribed treatment for fall prevention  Lacks caregiver support.   Clinical Goal(s):   Over the next 120days, patient will demonstrate improved adherence to prescribed treatment plan for decreasing falls as evidenced by patient reporting and review of EMR  Over the next  120 days, patient will verbalize using fall risk reduction  strategies discussed  Over the next 120 days, patient will not experience additional falls  Over the next 120 days, patient will verbalize understanding of plan for reducing fall events in her home setting  Over the next 120 days, patient will demonstrate a decrease in falls  exacerbations as evidenced by proper use of DME and wearing shoes or non-slip socks when ambulating  Over the next 120 days, patient will attend all scheduled medical appointments: next appointment with pcp on 07-30-2020 at 4:20 pm  Interventions:   Provided written and verbal education re: Potential causes of falls and Fall prevention strategies  Reviewed medications and discussed potential side effects of medications such as dizziness and frequent urination.  The patient has had changes in her medication and her blood pressures are better but still low. The patient has been taking and recording but could not tell RNCM most recent readings.   Assessed for s/s of orthostatic hypotension.  The patient has a history of orthostatic hypotension. The patient does not remember any events before the last fall.   Assessed for falls since last encounter. The patient states after her MD visit on 06-28-2020 she had a bad fall and was on the floor for 1 hour before her son found her.  She hit her head and threw up several times. She did not go to be evaluated at the ER.  The patient fell again on 07-07-2020 and her son Lennette Bihari was at home. She does not remember what happened that caused the fall. She hit her head again but did not throw up. She did not go and get evaluated. Education and assessment done.   Assessed patients knowledge of fall risk prevention secondary to previously provided education.  Assessed working status of life alert bracelet and patient adherence.  The patient does not have a life alert system. The patient states her son is there most of the time. Will get information for the patient on life alert systems.    Provided patient information for fall alert systems.  Discussed options and ask the patient to consider a life alert system.   Advised patient to let the pcp know of any new falls and seek emergent care for evaluation and treatment of falls  Provided education to patient re: use of good fitting shoes or skid proof socks. The patient states she has some skid proof socks somewhere.   Reviewed scheduled/upcoming provider appointments including: 07-30-2020  Pharmacy referral for medication review, help with paperwork and high risk medications that may increase falls.  Patient Self Care Activities:   Utilize walker (assistive device) appropriately with all ambulation  De-clutter walkways  Change positions slowly  Wear secure fitting shoes at all times with ambulation  Utilize home lighting for dim lit areas  Have self and pet awareness at all times  Plan:  CCM RN CM will follow up in 30 to 60   Initial goal documentation       COMPLETED: RNCM: Pt-"They said it happened because of the fluid around my heart" (pt-stated)        CARE PLAN ENTRY (see longtitudinal plan of care for additional care plan information)  Current Barriers: Closing this goal and opening in new ELS  Chronic Disease Management support, education, and care coordination needs related to CHF, CAD, HLD, COPD, DMII, Anxiety, and Depression  Clinical Goal(s) related to CHF, CAD, HLD, COPD, DMII, Anxiety, and Depression:  Over the next 120 days, patient will:   Work with the care management team to address educational, disease management, and care coordination needs   Begin or continue self health monitoring activities as directed today Measure and record cbg (blood glucose) 3 times weekly or more if taking steroids, Measure and record blood pressure 5 times per week, Measure and record weight daily, and adhere to a heart healthy/ADA diet  Call provider office for new or worsened signs and symptoms Blood  glucose findings outside established parameters, Blood pressure findings outside established parameters, Weight outside established parameters, Oxygen saturation lower than established parameter, Chest pain, Shortness of breath, and New or worsened symptom related to depression and other chronic conditions  Call care management team with questions or concerns  Verbalize basic understanding of patient centered plan of care established today  Interventions related to CHF, CAD, HLD, COPD, DMII, Anxiety, and Depression:   Evaluation of current treatment plans and patient's adherence to plan as established by provider. The patient had a heart catheterization on 01/23/2020.  She is doing well. Has blockages but states she is not going to elect to have surgery. The patient verbalized she goes back to see Dr. Clayborn Bigness on 01-31-2020 and will discuss treatment options.   She had called the office yesterday because her leg was numb.  They instructed her to "  walk it out".  The patient denies any new issues with numbness of leg today.  The patient was getting ready to call the office today because she was reading her instructions on caring for the insertion site and was confused. The discharge instructions AVS was pulled up and instructions provided to the patient on her level of understanding. The patient states she understands how to care for the site now. The patient states the gauze is dry and intact. Review of signs and symptoms of infection and to call Dr. Clayborn Bigness for changes in site or worsening condition. 03-20-2020: The patient had a right carotid PTA with stent placement on 03-06-2020.  The patient was in the hospital for 3 days. She is doing well and has no new concerns at this time. No s/s of infection at the site. Says her headaches have not gone away but feels it is due to arthritis. The patient follows up with Dr. Clayborn Bigness in August. 07-09-2020: The patient is having orthostatic hypotension. Has been taking  blood pressure but could not give numbers.  Education on keeping a record and bringing to MD visit on 07-30-2020  Assessed patient understanding of disease states.  The patient has a good understanding of her chronic conditions. The patient states she did not have changes in her weight at home. Review of her normal heart failure signs and symptoms. The patient has not weighed today but her weight is staying around 176.  The patient usually waits until later in the morning to weigh because of her balance first thing in the am. 07-09-2020: the patient states that her wt is up and down but not ever < or > 2 pounds. The patient knows to call the provider for weight changes > or < than parameters set by the provider.   Assessed patient's education and care coordination needs.  The patient has several upcoming appointments but has them all written down. The patient has to see a vein specialist because she has bilateral blockages in her carotid arteries. The patient will make an appointment to see the specialist.  The patient has a follow up appointment with Dr. Clayborn Bigness on 01-31-2020.  03-20-2020: The patient states that she does not have to have the left side done that it is okay. She has had no problems with the right side and is recovering well post procedure. 07-09-2020: The patient ask for help with filling out paperwork from [employer]. She ask if the [facility] could talk to the pharmacist and let her know she needs assistance. Will send in basket message to pharm D for assistance with patients request.   Provided disease specific education to patient.  Education on watching sodium content of foods. The patient verbalized she is doing a better job at this. The patient has only been checking her blood sugars every 3 days. The patient had not checked her blood sugar this am but yesterday before her heart catheterization it was 94.  Education on s/s of hypoglycemia/hyperglycemia and checking more frequently if she feels  differently.  The patient blood pressure is stable. She is weighing daily and also she is taking her blood pressure regularly and recording. 03-20-2020: The patient verbalized her blood sugars are ranging 110-140.  The patient says when she was in the hospital that she had some low readings and they had to give her orange juice. 07-09-2020: Denies any issues with blood sugars. Denies hypoglycemia. Will continue to monitor.   Collaborated with appropriate clinical care team members regarding patient needs.  Will refer LCSW for assistance with stressors and ways to relieve anxiety to reduce the incidence of smoking. Also the patient feels she is being a burden to her son. She denies suicidal ideation but says he has put his life on hold because of her. Education and support given today.  On going support and education from pharmacy for questions/concerns related to new medications. Will continue to monitor for changes.   Review of upcoming appointments: Sees Dr. Wynetta Emery on 07-30-2020 for follow up after medication changes.   CCM team numbers and roles discussed with the patient and she agrees to work with the team.   Patient Self Care Activities related to CHF, CAD, HLD, COPD, DMII, Anxiety, and Depression:   Patient is unable to independently self-manage chronic health conditions  Please see past updates related to this goal by clicking on the "Past Updates" button in the selected goal        Patient Care Plan: RNCM: Fall Risk (Adult)    Problem Identified: RNCM: Fall Risk   Priority: High    Long-Range Goal: RNCM: Absence of Fall and Fall-Related Injury   Priority: High  Note:   Current Barriers:   Knowledge Deficits related to fall precautions in patient with frequent falls and multiple chronic conditions.   Decreased adherence to prescribed treatment for fall prevention  Unable to independently manage falls in her home environment   Does not adhere to provider recommendations re: go  to ER for evaluation after falls   Lacks social connections  Does not contact provider office for questions/concerns  Knowledge Deficits related to resources to help in the home to increase safety   Chronic Disease Management support and education needs related to patient with frequent falls and multiple chronic conditions  Lacks caregiver support.  Clinical Goal(s):   patient will demonstrate improved adherence to prescribed treatment plan for decreasing falls as evidenced by patient reporting and review of EMR  patient will verbalize using fall risk reduction strategies discussed  patient will not experience additional falls  patient will verbalize understanding of plan for effective management of falls prevention and safety in the home and her environment   patient will work with [facility] and pcp  to address needs related to frequent falls  patient will attend all scheduled medical appointments: 12-31-2020 at 4:20 pm Interventions:   Collaboration with Valerie Roys, DO regarding development and update of comprehensive plan of care as evidenced by provider attestation and co-signature  Inter-disciplinary care team collaboration (see longitudinal plan of care)  Provided written and verbal education re: Potential causes of falls and Fall prevention strategies  Reviewed medications and discussed potential side effects of medications such as dizziness and frequent urination  Assessed for s/s of orthostatic hypotension  Assessed for falls since last encounter. The patient verbalized her last fall was 10-07-2020  Assessed patients knowledge of fall risk prevention secondary to previously provided education.  Assessed working status of life alert bracelet and patient adherence  Provided patient information for fall alert systems  Evaluation of current treatment plan related to falls prevention and safety in the home  and patient's adherence to plan as established by  provider.  Advised patient to call the office to report new falls or injuries   Provided education to patient re: fall prevention measures, being safe, using DME   Reviewed scheduled/upcoming provider appointments including: 12-31-2020 at 4:20 pm  Discussed plans with patient for ongoing care management follow up and provided patient with direct contact  information for care management team Self-Care Deficits:  Unable to independently manage frequent falls  Does not adhere to provider recommendations re: going to the ER to be evaluated after having a fall Lacks social connections Unable to perform IADLs independently Does not contact provider office for questions/concerns Patient Goals:  - Utilize walker (assistive device) appropriately with all ambulation - De-clutter walkways - Change positions slowly - Wear secure fitting shoes at all times with ambulation - Utilize home lighting for dim lit areas - Demonstrate self and pet awareness at all times - activities of daily living skills assessed - assistive or adaptive device use encouraged - barriers to physical activity or exercise addressed - barriers to physical activity or exercise identified - barriers to safety identified - cognition assessed - cognitive-stimulating activities promoted - fall prevention plan reviewed and updated - fear of falling, loss of independence and pain acknowledged - medication list reviewed - modification of home and work environment promoted - vision and/or hearing aid use promoted Follow Up Plan: Telephone follow up appointment with care management team member scheduled for: 01-03-2021 at 1 pm   Task: RNCM: Identify and Manage Contributors to Fall Risk   Note:   Care Management Activities:    - activities of daily living skills assessed - assistive or adaptive device use encouraged - barriers to physical activity or exercise addressed - barriers to physical activity or exercise identified -  barriers to safety identified - cognition assessed - cognitive-stimulating activities promoted - fall prevention plan reviewed and updated - fear of falling, loss of independence and pain acknowledged - medication list reviewed - modification of home and work environment promoted - vision and/or hearing aid use promoted       Patient Care Plan: RNCM: Hypertension (Adult)    Problem Identified: RNCM: Hypertension (Hypertension)   Priority: Medium    Long-Range Goal: RNCM: Hypertension Monitored   Priority: Medium  Note:   Objective:   Last practice recorded BP readings:  BP Readings from Last 3 Encounters:  10/03/20 138/64  09/20/20 (!) 192/91  08/28/20 (!) 153/82     Most recent eGFR/CrCl: No results found for: EGFR  No components found for: CRCL Current Barriers:   Knowledge Deficits related to basic understanding of hypertension pathophysiology and self care management  Knowledge Deficits related to understanding of medications prescribed for management of hypertension  Limited Social Support  Lacks social connections  Does not contact provider office for questions/concerns Case Manager Clinical Goal(s):   Over the next 120 days, patient will verbalize understanding of plan for hypertension management  Over the next 120 days, patient will attend all scheduled medical appointments: 12-31-2020  Over the next 120 days, patient will demonstrate improved adherence to prescribed treatment plan for hypertension as evidenced by taking all medications as prescribed, monitoring and recording blood pressure as directed, adhering to low sodium/DASH diet  Over the next 120 days, patient will demonstrate improved health management independence as evidenced by checking blood pressure as directed and notifying PCP if SBP>160 or DBP > 90, taking all medications as prescribe, and adhering to a low sodium diet as discussed.  Over the next 120 days, patient will verbalize basic  understanding of hypertension disease process and self health management plan as evidenced by compliance with medications, compliance with dietary restrictions, and working with the CCM team to manage health and well being  Interventions:   Collaboration with Valerie Roys, DO regarding development and update of comprehensive plan of care as evidenced  by provider attestation and co-signature  Inter-disciplinary care team collaboration (see longitudinal plan of care)  Evaluation of current treatment plan related to hypertension self management and patient's adherence to plan as established by provider.  Provided education to patient re: stroke prevention, s/s of heart attack and stroke, DASH diet, complications of uncontrolled blood pressure  Reviewed medications with patient and discussed importance of compliance  Discussed plans with patient for ongoing care management follow up and provided patient with direct contact information for care management team  Advised patient, providing education and rationale, to monitor blood pressure daily and record, calling PCP for findings outside established parameters. The patient states her blood pressures have been elevated. She states that most recently it was 160/95. Education on blood pressures greater than 564 systolic and greater than 90 diastolic place the patient at increased risk of heart attack and stroke. Will continue to monitor.   Reviewed scheduled/upcoming provider appointments including: 12-31-2020 Patient Goals: - blood pressure trends reviewed - depression screen reviewed - home or ambulatory blood pressure monitoring encouraged Self-Care Activities: - Self administers medications as prescribed Attends all scheduled provider appointments Calls provider office for new concerns, questions, or BP outside discussed parameters Checks BP and records as discussed Follows a low sodium diet/DASH diet Follow Up Plan: Telephone follow up  appointment with care management team member scheduled for: 01-03-2021 at 1 pm   Task: RNCM: Identify and Monitor Blood Pressure Elevation   Note:   Care Management Activities:    - blood pressure trends reviewed - depression screen reviewed - home or ambulatory blood pressure monitoring encouraged       Patient Care Plan: RNCM: Coronary Artery Disease (Adult) and HLD    Problem Identified: RNCM: Disease Progression (Coronary Artery Disease) and HLD   Priority: Medium    Long-Range Goal: RNCM: Disease Progression Prevented or Minimized   Priority: Medium  Note:   Current Barriers:   Poorly controlled hyperlipidemia, complicated by active smoker, HTN, DM  Current antihyperlipidemic regimen: Crestor 20 mg QD  Most recent lipid panel:     Component Value Date/Time   CHOL 142 04/18/2020 1623   CHOL 164 02/04/2016 1043   CHOL 258 (H) 01/16/2014 1200   TRIG 152 (H) 04/18/2020 1623   TRIG 258 (H) 02/04/2016 1043   TRIG 337 (H) 01/16/2014 1200   HDL 41 04/18/2020 1623   HDL 35 (L) 01/16/2014 1200   CHOLHDL 4.3 11/26/2016 0810   VLDL 51 (H) 11/26/2016 0810   VLDL 52 (H) 02/04/2016 1043   VLDL 67 (H) 01/16/2014 1200   LDLCALC 75 04/18/2020 1623   LDLCALC 156 (H) 01/16/2014 1200     ASCVD risk enhancing conditions: age >76, DM, HTN,  CHF, current smoker  Lacks Scientist, product/process development  Does not contact provider office for questions/concerns RN Care Manager Clinical Goal(s):   patient will work with Consulting civil engineer, providers, and care team towards execution of optimized self-health management plan  patient will verbalize understanding of plan for effective management of HLD and CAD  patient will work with [facility] and PCP  to address needs related to HLD and CAD management   patient will attend all scheduled medical appointments: 12-31-2020 Interventions:  Collaboration with Valerie Roys, DO regarding development and update of comprehensive plan of care as evidenced by  provider attestation and co-signature  Inter-disciplinary care team collaboration (see longitudinal plan of care)  Medication review performed; medication list updated in electronic medical record.   Inter-disciplinary care team collaboration (  see longitudinal plan of care)  Referred to pharmacy team for assistance with HLD and CAD medication management  Collaboration with the pcp, the patient says sometimes she is throwing her medications up after taking them. She has nausea medications but is not nauseated before taking the medications. It happens at different times and she has no precipitating factors that contribute to it. Ask the patient to try eating something and waiting 30 minutes before taking medications. Will let pcp know the patient is reporting this happening more frequently.   Evaluation of current treatment plan related to HLD and CAD and patient's adherence to plan as established by provider.  Advised patient to call the office for questions or changes   Provided education to patient re: heart healthy diet, maintaining health and well being   Reviewed scheduled/upcoming provider appointments including: 12-31-2020 at 4:20 pm  Discussed plans with patient for ongoing care management follow up and provided patient with direct contact information for care management team Patient Goals/Self-Care Activities: - call for medicine refill 2 or 3 days before it runs out - call if I am sick and can't take my medicine - keep a list of all the medicines I take; vitamins and herbals too - learn to read medicine labels - use a pillbox to sort medicine - use an alarm clock or phone to remind me to take my medicine - change to whole grain breads, cereal, pasta - drink 6 to 8 glasses of water each day - eat 3 to 5 servings of fruits and vegetables each day - eat 5 or 6 small meals each day - fill half the plate with nonstarchy vegetables - limit fast food meals to no more than 1 per  week - manage portion size - prepare main meal at home 3 to 5 days each week - read food labels for fat, fiber, carbohydrates and portion size - be open to making changes - I can manage, know and watch for signs of a heart attack - if I have chest pain, call for help - learn about small changes that will make a big difference - learn my personal risk factors - barriers to treatment adherence reviewed and addressed - functional limitation screening reviewed - healthy lifestyle promoted - medication-adherence assessment completed - medication side effects managed - rescue (action) plan developed - response to pharmacologic therapy monitored - self-awareness of signs/symptoms of worsening disease encouraged - smoking counseling provided  Follow Up Plan: Telephone follow up appointment with care management team member scheduled for: 01-03-2021 at 1 pm     Task: RNCM: Alleviate Barriers to Coronary Artery Disease Therapy   Note:   Care Management Activities:    - barriers to treatment adherence reviewed and addressed - functional limitation screening reviewed - healthy lifestyle promoted - medication-adherence assessment completed - medication side effects managed - rescue (action) plan developed - response to pharmacologic therapy monitored - self-awareness of signs/symptoms of worsening disease encouraged - smoking counseling provided       Patient Care Plan: RNCM: Heart Failure (Adult)    Problem Identified: RNCM: Symptom Exacerbation (Heart Failure)   Priority: Medium    Long-Range Goal: RNCM: Symptom Exacerbation Prevented or Minimized   Priority: Medium  Note:   Current Barriers:   Knowledge deficits related to basic heart failure pathophysiology and self care management  Lacks social connections  Unable to perform IADLs independently  Does not contact provider office for questions/concerns  Lack of scale in home  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(s):   patient will weigh self daily and record  patient will verbalize understanding of Heart Failure Action Plan and when to call doctor  patient will take all Heart Failure mediations as prescribed Interventions:   Collaboration with Valerie Roys, DO regarding development and update of comprehensive plan of care as evidenced by provider attestation and co-signature  Inter-disciplinary care team collaboration (see longitudinal plan of care)  Basic overview and discussion of pathophysiology of Heart Failure  Provided written and verbal education on low sodium diet  Reviewed Heart Failure Action Plan in depth and provided written copy  Assessed for scales in home- has scales but does not use due to safety reasons  Discussed importance of daily weight- the patient does not weigh daily, she has fallen multiple times while weighing, review of ways to tell fluid overload without weighing daily   Reviewed role of diuretics in prevention of fluid overload Patient Goals/Self-Care Activities:  Takes Heart Failure Medications as prescribed Weighs daily and record (notifying MD of 3 lb weight gain over night or 5 lb in a week) Verbalizes understanding of and follows CHF Action Plan Adheres to low sodium diet  - Take Heart Failure Medications as prescribed - Weigh daily and record (notify MD with 3 lb weight gain over night or 5 lb in a week) - Follow CHF Action Plan -- barriers to lifestyle changes reviewed and addressed - barriers to treatment reviewed and addressed - cognitive screening completed and reviewed - depression screen reviewed - health literacy screening completed or reviewed - healthy lifestyle promoted - medication-adherence assessment completed - rescue (action) plan developed - rescue (action) plan reviewed - self-awareness of signs/symptoms of worsening disease encouraged  Adhere to low sodium diet  Follow Up Plan: Telephone follow up appointment with care  management team member scheduled for: 01-03-2021 at 1 pm   Task: RNCM: Identify and Minimize Risk of Heart Failure Exacerbation   Note:   Care Management Activities:    - barriers to lifestyle changes reviewed and addressed - barriers to treatment reviewed and addressed - cognitive screening completed and reviewed - depression screen reviewed - health literacy screening completed or reviewed - healthy lifestyle promoted - medication-adherence assessment completed - rescue (action) plan developed - rescue (action) plan reviewed - self-awareness of signs/symptoms of worsening disease encouraged         Patient verbalizes understanding of instructions provided today and agrees to view in [place].   Telephone follow up appointment with care management team member scheduled for: 01-03-2021 at 1 pm  Noreene Larsson RN, MSN, [facility] Mobile: [phone]

## 2020-11-01 NOTE — Chronic Care Management (AMB) (Signed)
Chronic Care Management   CCM RN Visit Note  11/01/2020 Name: Sarah White MRN: [id number] DOB: 1946-04-07  Subjective: Sarah White is a 75 y.o. year old female who is a primary care patient of Valerie Roys, DO. The care management team was consulted for assistance with disease management and care coordination needs.    Engaged with patient by telephone for follow up visit in response to provider referral for case management and/or care coordination services.   Consent to Services:  The patient was given information about Chronic Care Management services, agreed to services, and gave verbal consent prior to initiation of services.  Please see initial visit note for detailed documentation.   Patient agreed to services and verbal consent obtained.   Assessment: Review of patient past medical history, allergies, medications, health status, including review of consultants reports, laboratory and other test data, was performed as part of comprehensive evaluation and provision of chronic care management services.   SDOH (Social Determinants of Health) assessments and interventions performed:    CCM Care Plan  Allergies  Allergen Reactions  . Amlodipine Swelling  . Atorvastatin Other (See Comments)    myalgia  . Codeine Nausea And Vomiting  . Hctz [Hydrochlorothiazide] Other (See Comments)    Hypokalemia  . Lisinopril Other (See Comments)    Dizziness   . Metformin And Related Nausea Only    Nausea, dizziness    Outpatient Encounter Medications as of 11/01/2020  Medication Sig Note  . albuterol (ACCUNEB) 0.63 MG/3ML nebulizer solution Take 3 mLs (0.63 mg total) by nebulization every 6 (six) hours as needed for wheezing.   Marland Kitchen albuterol (VENTOLIN HFA) 108 (90 Base) MCG/ACT inhaler Inhale 2 puffs into the lungs every 6 (six) hours as needed for wheezing or shortness of breath. (Patient not taking: No sig reported) 04-07-20: Taking 2 puffs BID  . amiodarone (PACERONE) 200 MG  tablet Take 100 mg by mouth daily.    Marland Kitchen aspirin EC 81 MG tablet Take 81 mg by mouth at bedtime.    . budesonide-formoterol (SYMBICORT) 160-4.5 MCG/ACT inhaler Inhale 2 puffs into the lungs 2 (two) times daily.   Marland Kitchen buPROPion (WELLBUTRIN SR) 150 MG 12 hr tablet Take 1 tablet (150 mg total) by mouth 2 (two) times daily.   . carvedilol (COREG) 12.5 MG tablet 1 tab in the AM and 2 tabs at night   . clopidogrel (PLAVIX) 75 MG tablet Take 1 tablet (75 mg total) by mouth daily at 6 (six) AM.   . cyclobenzaprine (FLEXERIL) 10 MG tablet TAKE 1 TABLET BY MOUTH THREE TIMES DAILY AS NEEDED FOR  MUSCLE  SPASMS. DO NOT DRIVE ON THIS MEDICATION. WILL MAKE YOU SLEEPY.   . diphenhydrAMINE (BENADRYL) 25 MG tablet Take 25 mg by mouth at bedtime as needed for allergies. Dies not take regularly 04/07/2020: Taking 1 QPM  . donepezil (ARICEPT) 5 MG tablet Take 10 mg by mouth daily.   . furosemide (LASIX) 40 MG tablet Take 20 mg by mouth daily.    . isosorbide mononitrate (IMDUR) 60 MG 24 hr tablet Take 60 mg by mouth daily.   . Multiple Vitamin (MULTIVITAMIN WITH MINERALS) TABS tablet Take 1 tablet by mouth daily. (Patient not taking: No sig reported)   . nitroGLYCERIN (NITROSTAT) 0.4 MG SL tablet Place 1 tablet (0.4 mg total) under the tongue every 5 (five) minutes as needed for chest pain.   Marland Kitchen ondansetron (ZOFRAN-ODT) 4 MG disintegrating tablet Take 1 tablet (4 mg total) by mouth  every 8 (eight) hours as needed for nausea or vomiting. (Patient not taking: No sig reported)   . potassium chloride SA (KLOR-CON) 20 MEQ tablet Take 1 tablet (20 mEq total) by mouth daily.   . QUEtiapine (SEROQUEL XR) 50 MG TB24 24 hr tablet Take 1 tablet (50 mg total) by mouth at bedtime.   . rosuvastatin (CRESTOR) 20 MG tablet Take 1 tablet (20 mg total) by mouth daily.   . sacubitril-valsartan (ENTRESTO) 49-51 MG Take 1 tablet by mouth 2 (two) times daily.    . sertraline (ZOLOFT) 100 MG tablet Take 2 tablets (200 mg total) by mouth daily.    . silver sulfADIAZINE (SILVADENE) 1 % cream Apply 1 application topically daily. (Patient taking differently: Apply 1 application topically as needed.)   . tiotropium (SPIRIVA HANDIHALER) 18 MCG inhalation capsule PLACE 1 CAPSULE INTO INHALER AND INHALE DAILY   . traZODone (DESYREL) 50 MG tablet TAKE ONE-HALF TO ONE TABLET BY MOUTH AT BEDTIME AS NEEDED FOR SLEEP   . triamcinolone ointment (KENALOG) 0.5 % Apply 1 application topically 2 (two) times daily. (Patient not taking: No sig reported)    No facility-administered encounter medications on file as of 11/01/2020.    Patient Active Problem List   Diagnosis Date Noted  . Falls frequently 07/30/2020  . Bilateral hearing loss 06/26/2020  . Confusion 06/26/2020  . Dizziness 06/26/2020  . Hallucinations, visual 06/26/2020  . Hearing loss 06/26/2020  . Memory difficulty 06/26/2020  . Symptomatic carotid artery narrowing without infarction 03/06/2020  . Symptomatic carotid artery stenosis without infarction 02/04/2020  . Acute respiratory failure with hypoxia and hypercarbia ([place]) 12/03/2019  . Acute on chronic respiratory failure with hypoxia ([place]) 05/15/2019  . Insomnia 05/05/2018  . Squamous cell carcinoma of arm, right 01/07/2017  . Depression, major, single episode, moderate ([place]) 12/01/2016  . Unstable angina ([place]) 11/26/2016  . Near syncope 03/11/2016  . Hypokalemia 03/11/2016  . Orthostatic hypotension 08/12/2015  . Hyperlipidemia 07/29/2015  . Acute systolic CHF (congestive heart failure) ([place]) 07/25/2015  . Hypertensive renal disease 07/25/2015  . Low back pain 07/25/2015  . Essential (primary) hypertension 07/25/2015  . Smoker 07/22/2015  . COPD (chronic obstructive pulmonary disease) ([place]) 07/22/2015  . Diabetes mellitus with chronic kidney disease ([place]) 07/22/2015  . CAD (coronary artery disease) 07/22/2015  . Type 2 diabetes mellitus with hyperglycemia ([place]) 07/22/2015    Conditions to be addressed/monitored:CHF, CAD,  HTN, HLD and frequent falls  Care Plan : RNCM: Fall Risk (Adult)  Updates made by Vanita Ingles since 11/01/2020 12:00 AM    Problem: RNCM: Fall Risk   Priority: High    Long-Range Goal: RNCM: Absence of Fall and Fall-Related Injury   Priority: High  Note:   Current Barriers:  Marland Kitchen Knowledge Deficits related to fall precautions in patient with frequent falls and multiple chronic conditions.  . Decreased adherence to prescribed treatment for fall prevention . Unable to independently manage falls in her home environment  . Does not adhere to provider recommendations re: go to ER for evaluation after falls  . Lacks social connections . Does not contact provider office for questions/concerns . Knowledge Deficits related to resources to help in the home to increase safety  . Chronic Disease Management support and education needs related to patient with frequent falls and multiple chronic conditions . Lacks caregiver support.  Clinical Goal(s):  . patient will demonstrate improved adherence to prescribed treatment plan for decreasing falls as evidenced by patient reporting and review of EMR .  patient will verbalize using fall risk reduction strategies discussed . patient will not experience additional falls . patient will verbalize understanding of plan for effective management of falls prevention and safety in the home and her environment  . patient will work with [facility] and pcp  to address needs related to frequent falls . patient will attend all scheduled medical appointments: 12-31-2020 at 4:20 pm Interventions:  . Collaboration with Valerie Roys, DO regarding development and update of comprehensive plan of care as evidenced by provider attestation and co-signature . Inter-disciplinary care team collaboration (see longitudinal plan of care) . Provided written and verbal education re: Potential causes of falls and Fall prevention strategies . Reviewed medications and discussed potential side  effects of medications such as dizziness and frequent urination . Assessed for s/s of orthostatic hypotension . Assessed for falls since last encounter. The patient verbalized her last fall was 10-07-2020 . Assessed patients knowledge of fall risk prevention secondary to previously provided education. . Assessed working status of life alert bracelet and patient adherence . Provided patient information for fall alert systems . Evaluation of current treatment plan related to falls prevention and safety in the home  and patient's adherence to plan as established by provider. . Advised patient to call the office to report new falls or injuries  . Provided education to patient re: fall prevention measures, being safe, using DME  . Reviewed scheduled/upcoming provider appointments including: 12-31-2020 at 4:20 pm . Discussed plans with patient for ongoing care management follow up and provided patient with direct contact information for care management team Self-Care Deficits:  Unable to independently manage frequent falls  Does not adhere to provider recommendations re: going to the ER to be evaluated after having a fall Lacks social connections Unable to perform IADLs independently Does not contact provider office for questions/concerns Patient Goals:  - Utilize walker (assistive device) appropriately with all ambulation - De-clutter walkways - Change positions slowly - Wear secure fitting shoes at all times with ambulation - Utilize home lighting for dim lit areas - Demonstrate self and pet awareness at all times - activities of daily living skills assessed - assistive or adaptive device use encouraged - barriers to physical activity or exercise addressed - barriers to physical activity or exercise identified - barriers to safety identified - cognition assessed - cognitive-stimulating activities promoted - fall prevention plan reviewed and updated - fear of falling, loss of independence and  pain acknowledged - medication list reviewed - modification of home and work environment promoted - vision and/or hearing aid use promoted Follow Up Plan: Telephone follow up appointment with care management team member scheduled for: 01-03-2021 at 1 pm   Task: RNCM: Identify and Manage Contributors to Fall Risk   Note:   Care Management Activities:    - activities of daily living skills assessed - assistive or adaptive device use encouraged - barriers to physical activity or exercise addressed - barriers to physical activity or exercise identified - barriers to safety identified - cognition assessed - cognitive-stimulating activities promoted - fall prevention plan reviewed and updated - fear of falling, loss of independence and pain acknowledged - medication list reviewed - modification of home and work environment promoted - vision and/or hearing aid use promoted       Care Plan : RNCM: Hypertension (Adult)  Updates made by Vanita Ingles since 11/01/2020 12:00 AM    Problem: RNCM: Hypertension (Hypertension)   Priority: Medium    Long-Range Goal: RNCM: Hypertension Monitored  Priority: Medium  Note:   Objective:  . Last practice recorded BP readings:  BP Readings from Last 3 Encounters:  10/03/20 138/64  09/20/20 (!) 192/91  08/28/20 (!) 153/82 .   Marland Kitchen Most recent eGFR/CrCl: No results found for: EGFR  No components found for: CRCL Current Barriers:  Marland Kitchen Knowledge Deficits related to basic understanding of hypertension pathophysiology and self care management . Knowledge Deficits related to understanding of medications prescribed for management of hypertension . Limited Social Support . Lacks social connections . Does not contact provider office for questions/concerns Case Manager Clinical Goal(s):  Marland Kitchen Over the next 120 days, patient will verbalize understanding of plan for hypertension management . Over the next 120 days, patient will attend all scheduled medical  appointments: 12-31-2020 . Over the next 120 days, patient will demonstrate improved adherence to prescribed treatment plan for hypertension as evidenced by taking all medications as prescribed, monitoring and recording blood pressure as directed, adhering to low sodium/DASH diet . Over the next 120 days, patient will demonstrate improved health management independence as evidenced by checking blood pressure as directed and notifying PCP if SBP>160 or DBP > 90, taking all medications as prescribe, and adhering to a low sodium diet as discussed. . Over the next 120 days, patient will verbalize basic understanding of hypertension disease process and self health management plan as evidenced by compliance with medications, compliance with dietary restrictions, and working with the CCM team to manage health and well being  Interventions:  . Collaboration with Valerie Roys, DO regarding development and update of comprehensive plan of care as evidenced by provider attestation and co-signature . Inter-disciplinary care team collaboration (see longitudinal plan of care) . Evaluation of current treatment plan related to hypertension self management and patient's adherence to plan as established by provider. . Provided education to patient re: stroke prevention, s/s of heart attack and stroke, DASH diet, complications of uncontrolled blood pressure . Reviewed medications with patient and discussed importance of compliance . Discussed plans with patient for ongoing care management follow up and provided patient with direct contact information for care management team . Advised patient, providing education and rationale, to monitor blood pressure daily and record, calling PCP for findings outside established parameters. The patient states her blood pressures have been elevated. She states that most recently it was 160/95. Education on blood pressures greater than 660 systolic and greater than 90 diastolic place the  patient at increased risk of heart attack and stroke. Will continue to monitor.  . Reviewed scheduled/upcoming provider appointments including: 12-31-2020 Patient Goals: - blood pressure trends reviewed - depression screen reviewed - home or ambulatory blood pressure monitoring encouraged Self-Care Activities: - Self administers medications as prescribed Attends all scheduled provider appointments Calls provider office for new concerns, questions, or BP outside discussed parameters Checks BP and records as discussed Follows a low sodium diet/DASH diet Follow Up Plan: Telephone follow up appointment with care management team member scheduled for: 01-03-2021 at 1 pm   Task: RNCM: Identify and Monitor Blood Pressure Elevation   Note:   Care Management Activities:    - blood pressure trends reviewed - depression screen reviewed - home or ambulatory blood pressure monitoring encouraged       Care Plan : RNCM: Coronary Artery Disease (Adult) and HLD  Updates made by Vanita Ingles since 11/01/2020 12:00 AM    Problem: RNCM: Disease Progression (Coronary Artery Disease) and HLD   Priority: Medium    Long-Range Goal: RNCM: Disease  Progression Prevented or Minimized   Priority: Medium  Note:   Current Barriers:  . Poorly controlled hyperlipidemia, complicated by active smoker, HTN, DM . Current antihyperlipidemic regimen: Crestor 20 mg QD . Most recent lipid panel:     Component Value Date/Time   CHOL 142 04/18/2020 1623   CHOL 164 02/04/2016 1043   CHOL 258 (H) 01/16/2014 1200   TRIG 152 (H) 04/18/2020 1623   TRIG 258 (H) 02/04/2016 1043   TRIG 337 (H) 01/16/2014 1200   HDL 41 04/18/2020 1623   HDL 35 (L) 01/16/2014 1200   CHOLHDL 4.3 11/26/2016 0810   VLDL 51 (H) 11/26/2016 0810   VLDL 52 (H) 02/04/2016 1043   VLDL 67 (H) 01/16/2014 1200   LDLCALC 75 04/18/2020 1623   LDLCALC 156 (H) 01/16/2014 1200 .   Marland Kitchen ASCVD risk enhancing conditions: age >7, DM, HTN,  CHF, current  smoker . Lacks social connections . Does not contact provider office for questions/concerns RN Care Manager Clinical Goal(s):  . patient will work with Consulting civil engineer, providers, and care team towards execution of optimized self-health management plan . patient will verbalize understanding of plan for effective management of HLD and CAD . patient will work with [facility] and PCP  to address needs related to HLD and CAD management  . patient will attend all scheduled medical appointments: 12-31-2020 Interventions: . Collaboration with Valerie Roys, DO regarding development and update of comprehensive plan of care as evidenced by provider attestation and co-signature . Inter-disciplinary care team collaboration (see longitudinal plan of care) . Medication review performed; medication list updated in electronic medical record.  Bertram Savin care team collaboration (see longitudinal plan of care) . Referred to pharmacy team for assistance with HLD and CAD medication management . Collaboration with the pcp, the patient says sometimes she is throwing her medications up after taking them. She has nausea medications but is not nauseated before taking the medications. It happens at different times and she has no precipitating factors that contribute to it. Ask the patient to try eating something and waiting 30 minutes before taking medications. Will let pcp know the patient is reporting this happening more frequently.  . Evaluation of current treatment plan related to HLD and CAD and patient's adherence to plan as established by provider. . Advised patient to call the office for questions or changes  . Provided education to patient re: heart healthy diet, maintaining health and well being  . Reviewed scheduled/upcoming provider appointments including: 12-31-2020 at 4:20 pm . Discussed plans with patient for ongoing care management follow up and provided patient with direct contact information for care  management team Patient Goals/Self-Care Activities: - call for medicine refill 2 or 3 days before it runs out - call if I am sick and can't take my medicine - keep a list of all the medicines I take; vitamins and herbals too - learn to read medicine labels - use a pillbox to sort medicine - use an alarm clock or phone to remind me to take my medicine - change to whole grain breads, cereal, pasta - drink 6 to 8 glasses of water each day - eat 3 to 5 servings of fruits and vegetables each day - eat 5 or 6 small meals each day - fill half the plate with nonstarchy vegetables - limit fast food meals to no more than 1 per week - manage portion size - prepare main meal at home 3 to 5 days each week - read  food labels for fat, fiber, carbohydrates and portion size - be open to making changes - I can manage, know and watch for signs of a heart attack - if I have chest pain, call for help - learn about small changes that will make a big difference - learn my personal risk factors - barriers to treatment adherence reviewed and addressed - functional limitation screening reviewed - healthy lifestyle promoted - medication-adherence assessment completed - medication side effects managed - rescue (action) plan developed - response to pharmacologic therapy monitored - self-awareness of signs/symptoms of worsening disease encouraged - smoking counseling provided  Follow Up Plan: Telephone follow up appointment with care management team member scheduled for: 01-03-2021 at 1 pm     Task: RNCM: Alleviate Barriers to Coronary Artery Disease Therapy   Note:   Care Management Activities:    - barriers to treatment adherence reviewed and addressed - functional limitation screening reviewed - healthy lifestyle promoted - medication-adherence assessment completed - medication side effects managed - rescue (action) plan developed - response to pharmacologic therapy monitored - self-awareness of  signs/symptoms of worsening disease encouraged - smoking counseling provided       Care Plan : RNCM: Heart Failure (Adult)  Updates made by Vanita Ingles since 11/01/2020 12:00 AM    Problem: RNCM: Symptom Exacerbation (Heart Failure)   Priority: Medium    Long-Range Goal: RNCM: Symptom Exacerbation Prevented or Minimized   Priority: Medium  Note:   Current Barriers:  Marland Kitchen Knowledge deficits related to basic heart failure pathophysiology and self care management . Lacks social connections . Unable to perform IADLs independently . Does not contact provider office for questions/concerns . Lack of scale in home . Financial strain Nurse Case Manager Clinical Goal(s):   patient will weigh self daily and record  patient will verbalize understanding of Heart Failure Action Plan and when to call doctor  patient will take all Heart Failure mediations as prescribed Interventions:  . Collaboration with Valerie Roys, DO regarding development and update of comprehensive plan of care as evidenced by provider attestation and co-signature . Inter-disciplinary care team collaboration (see longitudinal plan of care) . Basic overview and discussion of pathophysiology of Heart Failure . Provided written and verbal education on low sodium diet . Reviewed Heart Failure Action Plan in depth and provided written copy . Assessed for scales in home- has scales but does not use due to safety reasons . Discussed importance of daily weight- the patient does not weigh daily, she has fallen multiple times while weighing, review of ways to tell fluid overload without weighing daily  . Reviewed role of diuretics in prevention of fluid overload Patient Goals/Self-Care Activities:  Takes Heart Failure Medications as prescribed Weighs daily and record (notifying MD of 3 lb weight gain over night or 5 lb in a week) Verbalizes understanding of and follows CHF Action Plan Adheres to low sodium diet  - Take Heart  Failure Medications as prescribed - Weigh daily and record (notify MD with 3 lb weight gain over night or 5 lb in a week) - Follow CHF Action Plan -- barriers to lifestyle changes reviewed and addressed - barriers to treatment reviewed and addressed - cognitive screening completed and reviewed - depression screen reviewed - health literacy screening completed or reviewed - healthy lifestyle promoted - medication-adherence assessment completed - rescue (action) plan developed - rescue (action) plan reviewed - self-awareness of signs/symptoms of worsening disease encouraged  Adhere to low sodium diet  Follow Up  Plan: Telephone follow up appointment with care management team member scheduled for: 01-03-2021 at 1 pm   Task: RNCM: Identify and Minimize Risk of Heart Failure Exacerbation   Note:   Care Management Activities:    - barriers to lifestyle changes reviewed and addressed - barriers to treatment reviewed and addressed - cognitive screening completed and reviewed - depression screen reviewed - health literacy screening completed or reviewed - healthy lifestyle promoted - medication-adherence assessment completed - rescue (action) plan developed - rescue (action) plan reviewed - self-awareness of signs/symptoms of worsening disease encouraged         Plan:Telephone follow up appointment with care management team member scheduled for:  01-03-2021 at 1 pm  [place], MSN, [facility] Mobile: [phone]

## 2020-11-20 DIAGNOSIS — I700 Atherosclerosis of aorta: Secondary | ICD-10-CM | POA: Insufficient documentation

## 2020-11-28 ENCOUNTER — Other Ambulatory Visit: Payer: Self-pay | Admitting: Family Medicine

## 2020-11-28 NOTE — Telephone Encounter (Signed)
Requested medications are due for refill today.  Unknown  Requested medications are on the active medications list.  yes  Last refill. unknown  Future visit scheduled.  yes  Notes to clinic.  Medication is historical.

## 2020-11-28 NOTE — Telephone Encounter (Signed)
Routing to provider  

## 2020-11-28 NOTE — Telephone Encounter (Signed)
Requested Prescriptions  Pending Prescriptions Disp Refills  . buPROPion (WELLBUTRIN SR) 150 MG 12 hr tablet [Pharmacy Med Name: buPROPion HCl ER (SR) 150 MG Oral Tablet Extended Release 12 Hour] 180 tablet 1    Sig: Take 1 tablet by mouth twice daily     Psychiatry: Antidepressants - bupropion Passed - 11/28/2020  5:31 AM      Passed - Completed PHQ-2 or PHQ-9 in the last 360 days      Passed - Last BP in normal range    BP Readings from Last 1 Encounters:  10/03/20 138/64         Passed - Valid encounter within last 6 months    Recent Outpatient Visits          1 month ago Diarrhea of presumed infectious origin   [place], [place], DO   2 months ago Diarrhea of presumed infectious origin   [facility], [place] P, DO   3 months ago Acute cystitis without hematuria   [employer], [name], DO   3 months ago Type 2 diabetes mellitus with hyperglycemia, without long-term current use of insulin ([place])   [place], [name], DO   5 months ago [place], [place], DO      Future Appointments            In 1 month Johnson, [name], DO [place], [place]   In 4 months  [facility]           . carvedilol (COREG) 12.5 MG tablet [employer] Med Name: Carvedilol 12.5 MG Oral Tablet] 360 tablet 1    Sig: TAKE 2 TABLETS BY MOUTH TWICE DAILY WITH A MEAL     Cardiovascular:  Beta Blockers Passed - 11/28/2020  5:31 AM      Passed - Last BP in normal range    BP Readings from Last 1 Encounters:  10/03/20 138/64         Passed - Last Heart Rate in normal range    Pulse Readings from Last 1 Encounters:  10/03/20 65         Passed - Valid encounter within last 6 months    Recent Outpatient Visits          1 month ago Diarrhea of presumed infectious origin   [facility], [place] P, DO   2 months ago Diarrhea of  presumed infectious origin   [employer], [place], DO   3 months ago Acute cystitis without hematuria   [employer], [name], DO   3 months ago Type 2 diabetes mellitus with hyperglycemia, without long-term current use of insulin ([place])   [place], [name], DO   5 months ago [place], [place], DO      Future Appointments            In 1 month Johnson, [name], DO [place], [facility]   In 4 months  [facility], [facility]           . furosemide (LASIX) 40 MG tablet [Pharmacy Med Name: Furosemide 40 MG Oral Tablet] 90 tablet 0    Sig: Take 1 tablet by mouth once daily     Cardiovascular:  Diuretics - Loop Failed - 11/28/2020  5:31 AM      Failed -  Na in normal range and within 360 days    Sodium  Date Value Ref Range Status  09/24/2020 146 (H) 134 - 144 mmol/L Final  01/15/2014 134 (L) 136 - 145 mmol/L Final         Passed - K in normal range and within 360 days    Potassium  Date Value Ref Range Status  09/24/2020 4.0 3.5 - 5.2 mmol/L Final  01/15/2014 4.2 3.5 - 5.1 mmol/L Final         Passed - Ca in normal range and within 360 days    Calcium  Date Value Ref Range Status  09/24/2020 9.2 8.7 - 10.3 mg/dL Final   Calcium, Total  Date Value Ref Range Status  01/15/2014 9.2 8.5 - 10.1 mg/dL Final         Passed - Cr in normal range and within 360 days    Creatinine  Date Value Ref Range Status  01/15/2014 0.52 (L) 0.60 - 1.30 mg/dL Final   Creatinine, Ser  Date Value Ref Range Status  09/24/2020 0.82 0.57 - 1.00 mg/dL Final         Passed - Last BP in normal range    BP Readings from Last 1 Encounters:  10/03/20 138/64         Passed - Valid encounter within last 6 months    Recent Outpatient Visits          1 month ago Diarrhea of presumed infectious origin   [place], [place], DO   2 months ago Diarrhea of  presumed infectious origin   [employer], [place] P, DO   3 months ago Acute cystitis without hematuria   [employer], [name], DO   3 months ago Type 2 diabetes mellitus with hyperglycemia, without long-term current use of insulin ([place])   [place], [place], DO   5 months ago [place], [place], DO      Future Appointments            In 1 month Johnson, [name], DO [place], [place]   In 4 months  [facility]           . sertraline (ZOLOFT) 100 MG tablet [Pharmacy Med Name: Sertraline HCl 100 MG Oral Tablet] 180 tablet 1    Sig: Take 2 tablets by mouth once daily     Psychiatry:  Antidepressants - SSRI Passed - 11/28/2020  5:31 AM      Passed - Completed PHQ-2 or PHQ-9 in the last 360 days      Passed - Valid encounter within last 6 months    Recent Outpatient Visits          1 month ago Diarrhea of presumed infectious origin   [place], [place], DO   2 months ago Diarrhea of presumed infectious origin   [facility], [place] P, DO   3 months ago Acute cystitis without hematuria   [place], [name], DO   3 months ago Type 2 diabetes mellitus with hyperglycemia, without long-term current use of insulin ([place])   [place], [place], DO   5 months ago [place], [place], DO      Future Appointments            In 1 month [place], [place]  P, DO [place]   In 4 months  [facility], [facility]

## 2020-11-28 NOTE — Telephone Encounter (Signed)
Refill request received from Pharmacy for Furosemide 40mg    Last office visit 10/03/20 No future visit notes=d

## 2020-11-29 ENCOUNTER — Ambulatory Visit (INDEPENDENT_AMBULATORY_CARE_PROVIDER_SITE_OTHER): Payer: Medicare Other | Admitting: Pharmacist

## 2020-11-29 DIAGNOSIS — I10 Essential (primary) hypertension: Secondary | ICD-10-CM

## 2020-11-29 DIAGNOSIS — E1165 Type 2 diabetes mellitus with hyperglycemia: Secondary | ICD-10-CM

## 2020-11-29 NOTE — Progress Notes (Incomplete)
Chronic Care Management Pharmacy Note  11/29/2020 Name:  Sarah White MRN:  [id number] DOB:  Aug 24, 1946  Subjective: Sarah White is an 75 y.o. year old female who is a primary patient of Sarah Roys, DO.  The CCM team was consulted for assistance with disease management and care coordination needs.    Engaged with patient by telephone for follow up visit in response to provider referral for pharmacy case management and/or care coordination services.   Consent to Services:  The patient was given information about Chronic Care Management services, agreed to services, and gave verbal consent prior to initiation of services.  Please see initial visit note for detailed documentation.   Patient Care Team: Sarah Roys, [name] Sarah Kida, MD as Consulting Physician (Cardiology) Sarah Kida, MD as Consulting Physician (Cardiology) Sarah Ingles, RN as Case Manager ([place]) Sarah White, [facility] (Pharmacist)  Recent office visits: 09/20/20-Sarah White (PCP)  diarrhea, stool work up negative, cbc with diff, cmp, flagyl 500 mg bid started, BP 192/91 2/03/22Wynetta White (PCP) follow up , Flagyl d/c, bp 138/64, 10/06/20- Patient called to report falling in [place] bathroom and hitting head- recommend ems  Recent consult visits: 09/30/20- Sarah White(Neurology)- aricept 10 mg qpm  For worsening memory loss and hallucinations, will check tsh and B12 next visit  Hospital visits: None in previous 6 months  Objective:  Lab Results  Component Value Date   CREATININE 0.82 09/24/2020   BUN 10 09/24/2020   GFRNONAA 72 09/24/2020   GFRAA 83 09/24/2020   NA 146 (H) 09/24/2020   K 4.0 09/24/2020   CALCIUM 9.2 09/24/2020   CO2 21 09/24/2020   GLUCOSE 115 (H) 09/24/2020    Lab Results  Component Value Date/Time   HGBA1C 5.1 08/07/2020 10:33 AM   HGBA1C 5.6 04/18/2020 04:22 PM   HGBA1C 10.8 (H) 01/15/2014 04:08 PM   MICROALBUR 80 (H)  08/07/2020 10:33 AM   MICROALBUR 10 08/22/2019 01:08 PM    Last diabetic Eye exam:  Lab Results  Component Value Date/Time   HMDIABEYEEXA No Retinopathy 09/10/2015 01:16 PM    Last diabetic Foot exam: No results found for: HMDIABFOOTEX   Lab Results  Component Value Date   CHOL 142 04/18/2020   HDL 41 04/18/2020   LDLCALC 75 04/18/2020   TRIG 152 (H) 04/18/2020   CHOLHDL 4.3 11/26/2016    Hepatic Function Latest Ref Rng & Units 09/24/2020 08/07/2020 04/18/2020  Total Protein 6.0 - 8.5 g/dL 6.2 6.4 6.3  Albumin 3.7 - 4.7 g/dL 4.3 4.3 4.3  AST 0 - 40 IU/L 20 125(H) 51(H)  ALT 0 - 32 IU/L 13 96(H) 64(H)  Alk Phosphatase 44 - 121 IU/L 101 138(H) 90  Total Bilirubin 0.0 - 1.2 mg/dL <0.2 0.5 0.3  Bilirubin, Direct 0.0 - 0.2 mg/dL - - -    Lab Results  Component Value Date/Time   TSH 2.350 03/24/2018 10:43 AM   TSH 1.590 05/25/2017 03:51 PM    CBC Latest Ref Rng & Units 09/24/2020 08/07/2020 03/08/2020  WBC 3.4 - 10.8 x10E3/uL 4.6 6.2 4.6  Hemoglobin 11.1 - 15.9 g/dL 13.8 13.7 11.1(L)  Hematocrit 34.0 - 46.6 % 41.7 41.9 35.3(L)  Platelets 150 - 450 x10E3/uL 112(L) 121(L) 108(L)    No results found for: VD25OH  Clinical ASCVD: Yes  The ASCVD Risk score [name], et al., 2013) failed to calculate for the following reasons:   The patient has a  prior MI or stroke diagnosis    Depression screen [facility] 2/9 09/20/2020 05/17/2020 04/22/2020  Decreased Interest 0 1 3  Down, Depressed, Hopeless 0 1 2  PHQ - 2 Score 0 2 5  Altered sleeping 3 2 0  Tired, decreased energy 0 - 0  Change in appetite 3 3 0  Feeling bad or failure about yourself  3 0 0  Trouble concentrating 0 2 0  Moving slowly or fidgety/restless 0 0 0  Suicidal thoughts 0 0 0  PHQ-9 Score 9 9 5   Difficult doing work/chores Not difficult at all Not difficult at all Not difficult at all  Some recent data might be hidden       Social History   Tobacco Use  Smoking Status Current Every Day Smoker  . Packs/day:  1.00  . Types: Cigarettes  Smokeless Tobacco Never Used   BP Readings from Last 3 Encounters:  10/03/20 138/64  09/20/20 (!) 192/91  08/28/20 (!) 153/82   Pulse Readings from Last 3 Encounters:  10/03/20 65  09/20/20 77  08/28/20 75   Wt Readings from Last 3 Encounters:  10/03/20 159 lb (72.1 kg)  08/28/20 160 lb 3.2 oz (72.7 kg)  08/07/20 156 lb 9.6 oz (71 kg)   BMI Readings from Last 3 Encounters:  10/03/20 27.29 kg/m  08/28/20 27.50 kg/m  08/07/20 26.88 kg/m    Assessment/Interventions: Review of patient past medical history, allergies, medications, health status, including review of consultants reports, laboratory and other test data, was performed as part of comprehensive evaluation and provision of chronic care management services.   SDOH:  (Social Determinants of Health) assessments and interventions performed: {yes/no:20286}  SDOH Screenings   Alcohol Screen: Low Risk   . Last Alcohol Screening Score (AUDIT): 0  Depression (PHQ2-9): Medium Risk  . PHQ-2 Score: 9  Financial Resource Strain: Low Risk   . Difficulty of Paying Living Expenses: Not hard at all  Food Insecurity: No Food Insecurity  . Worried About Charity fundraiser in the Last Year: Never true  . Ran Out of Food in the Last Year: Never true  Housing: Low Risk   . Last Housing Risk Score: 0  Physical Activity: Inactive  . Days of Exercise per Week: 0 days  . Minutes of Exercise per Session: 0 min  Social Connections: Socially Isolated  . Frequency of Communication with Friends and Family: More than three times a week  . Frequency of Social Gatherings with Friends and Family: More than three times a week  . Attends Religious Services: Never  . Active Member of Clubs or Organizations: No  . Attends Archivist Meetings: Never  . Marital Status: Widowed  Stress: No Stress Concern Present  . Feeling of Stress : Not at all  Tobacco Use: High Risk  . Smoking Tobacco Use: Current Every  Day Smoker  . Smokeless Tobacco Use: Never Used  Transportation Needs: No Transportation Needs  . Lack of Transportation (Medical): No  . Lack of Transportation (Non-Medical): No     Immunization History  Administered Date(s) Administered  . Fluad Quad(high Dose 65+) 04/28/2019, 05/24/2020  . Influenza, High Dose Seasonal PF 05/18/2016, 05/25/2017, 05/05/2018  . Influenza, Seasonal, Injecte, Preservative Fre 06/05/2015  . PFIZER(Purple Top)SARS-COV-2 Vaccination 10/16/2019, 11/06/2019, 09/24/2020  . Pneumococcal Conjugate-13 07/29/2015  . Pneumococcal Polysaccharide-23 09/15/2016  . Tdap 09/24/2015  . Zoster 02/07/2015    Conditions to be addressed/monitored:  Hypertension, Hyperlipidemia, Diabetes, Heart Failure, Coronary Artery Disease, COPD, Depression and  memory issues and frequent falls  There are no care plans that you recently modified to display for this patient.    Medication Assistance: [name], Symbicort and Trulicityobtained through Manufacturer medication assistance program.  Enrollment ends 08/30/21  Patient's preferred pharmacy is:  [facility] [address] ([address]), [place] - [place]) [place] [address] Phone: [phone] Fax: [phone]  [facility] - [place], [place] - [address] [place] [address] Phone: [phone] Fax: [phone]  Uses pill box? Yes Pt endorses % compliance  We discussed: Benefits of medication synchronization, packaging and delivery as well as enhanced pharmacist oversight with [facility]. Patient decided to: Continue current medication management strategy. Patient states [facility] takes really good care of her and she wants to stay with them for now.  Care Plan and Follow Up Patient Decision:  Patient agrees to Care Plan and Follow-up.  Plan: Telephone follow up appointment with care management team member  scheduled for:  2 months  Junita Push. Kenton Kingfisher PharmD, BCPS Clinical Pharmacist [facility] [phone]  Current Barriers:  . Unable to independently afford treatment regimen . Unable to independently monitor therapeutic efficacy . Unable to maintain control of medical conditions . Unable to self administer medications as prescribed . Does not contact provider office for questions/concerns . Memory decline .   Pharmacist Clinical Goal(s):  Marland Kitchen Patient will verbalize ability to afford treatment regimen . achieve adherence to monitoring guidelines and medication adherence to achieve therapeutic efficacy . achieve control of DM, HTN, HLD as evidenced by lab values . achieve ability to self administer medications as prescribed through use of pill box, fill dates, assessment calls from pharmacy team as evidenced by patient report . adhere to prescribed medication regimen as evidenced by fill dates . contact provider office for questions/concerns as evidenced notation of same in electronic health record through collaboration with PharmD and provider.  .   Interventions: . 1:1 collaboration with Sarah Roys, DO regarding development and update of comprehensive plan of care as evidenced by provider attestation and co-signature . Inter-disciplinary care team collaboration (see longitudinal plan of care) . Comprehensive medication review performed; medication list updated in electronic medical record .  BP Readings from Last 3 Encounters:  10/03/20 138/64  09/20/20 (!) 192/91  08/28/20 (!) 153/82    Hypertension (BP goal <140/90) -Not ideally controlled -Current treatment: . Carvedilol 25 mg bid . Entresto 49/51 mg bid . Furosemide 20 mg qd -Medications previously tried: NA -Current home readings: 140/80 -Current dietary habits: Sleeps late so usually eats leftovers or oatmeal, when she gets up and will eat supper. Cooked twice this week. Italian noodles with brussel  sprouts and baby lima beans.  -Current exercise habits: Uses a walker in home. No structured exercise -Denies hypotensive/hypertensive symptoms -Educated on BP goals and benefits of medications for prevention of heart attack, stroke and kidney damage; Daily salt intake goal < 2300 mg; Proper BP monitoring technique; Symptoms of hypotension and importance of maintaining adequate hydration; -Counseled to monitor BP at home ***, document, and provide log at future appointments -Recommended to continue current medication  -Reviewed fill history   Lab Results  Component Value Date   [place] 75 04/18/2020    Hyperlipidemia/CAD/ Carotid stent: (LDL goal < 70) -Not ideally controlled -Current treatment: . Rosuvastatin 20 mg qd . ASA 81 mg qd . Clopidogrel 75 mg qd . Isosorbide mononitrate 60 mg qd . NTG  SL 0.4 mg prn - -Educated on Cholesterol goals;  Benefits of statin for ASCVD risk reduction; -Recommended to continue current medication Counseled on signs and symptoms of bleeding.   Diabetes (A1c goal <7%) -Controlled -Current medications: . Trulicity 1.5 mg q 2-3 weeks -Medications previously tried: NA  -Current home glucose readings . fasting glucose: 141 . post prandial glucose: not checking -Denies hypoglycemic/hyperglycemic symptoms -Current meal patterns:  Last dose 3 weeks ago will take if 170,180, 190 . Patient reports checking BG daily and taking a dose of Trulicity when her FBG stays elevated for 2-3 days in a row. She does not take more often than every 2 weeks and never > 1 dose per week per her report -Educated on {CCM DM COUNSELING:25123} -Counseled to check feet daily and get yearly eye exams -{CCMPHARMDINTERVENTION:25122} PAP?   Heart Failure (Goal: manage symptoms and prevent exacerbations) -{US controlled/uncontrolled:25276} -Last ejection fraction: *** (Date: ***) -HF type: {type of heart failure:30421350} -NYHA Class: {CHL HP [facility] NYHA  Class:[phone]} -AHA HF Stage: {CHL HP [facility] AHA HF Stage:[phone]} -Current treatment: . *** -Medications previously tried: ***  -Current home BP/HR readings: *** -Current dietary habits: *** -Current exercise habits: *** -Educated on {CCM HF Counseling:25125} -{CCMPHARMDINTERVENTION:25122}  COPD (Goal: control symptoms and prevent exacerbations) -{US controlled/uncontrolled:25276} -Current treatment  . Symbicort  2 puffs bid . Albuterol prn -Medications previously tried: ***  -Gold Grade: {CHL HP [facility] COPD Gold FGHWE:[id number]} -Current COPD Classification:  {CHL HP [facility] COPD Classification:[phone]} -MMRC/CAT score: *** -Pulmonary function testing: *** -Exacerbations requiring treatment in last 6 months: *** -Patient {Actions; denies-reports:120008} consistent use of maintenance inhaler -Frequency of rescue inhaler use: *** -Counseled on {CCMINHALERCOUNSELING:25121} -{CCMPHARMDINTERVENTION:25122} PAP?  Depression/Anxiety (Goal: ***) -{US controlled/uncontrolled:25276} -Current treatment: . *** -Medications previously tried/failed: *** -PHQ9: *** -GAD7: *** -Connected with *** for mental health support -Educated on {CCM mental health counseling:25127} -{CCMPHARMDINTERVENTION:25122}  Tobacco use (Goal ***) -{US controlled/uncontrolled:25276} -Previous quit attempts: *** -Current treatment  . *** -Patient smokes {Time to first cigarette:23873} -Patient triggers include: {Smoking Triggers:23882} -On a scale of 1-10, reports MOTIVATION to quit is *** -On a scale of 1-10, reports CONFIDENCE in quitting is *** -{Smoking Cessation Counseling:23883} -{CCMPHARMDINTERVENTION:25122}  *** (Goal: ***) -{US controlled/uncontrolled:25276} -Current treatment  . *** -Medications previously tried: ***  -{CCMPHARMDINTERVENTION:25122}   Patient Goals/Self-Care Activities . Patient will:  - {pharmacypatientgoals:24919}  Follow Up Plan:  {CM FOLLOW UP LFYB:01751}

## 2020-12-02 ENCOUNTER — Ambulatory Visit: Payer: Medicare Other

## 2020-12-13 NOTE — Progress Notes (Signed)
Chronic Care Management Pharmacy Note  12/13/2020 Name:  Sarah White MRN:  [id number] DOB:  06-May-1946  Subjective: Sarah White is an 75 y.o. year old female who is a primary patient of Valerie Roys, DO.  The CCM team was consulted for assistance with disease management and care coordination needs.    Engaged with patient by telephone for follow up visit in response to provider referral for pharmacy case management and/or care coordination services.   Consent to Services:  The patient was given information about Chronic Care Management services, agreed to services, and gave verbal consent prior to initiation of services.  Please see initial visit note for detailed documentation.   [name], [name] Yolonda Kida, MD as Consulting Physician (Cardiology) Yolonda Kida, MD as Consulting Physician (Cardiology) Vanita Ingles, RN as Case Manager ([place]) Vladimir Faster, [facility] (Pharmacist)  Recent office visits: 10/03/20-Johnson (PCP)- diarrhea follow up, stool studies all negative 1/21/22Wynetta Emery (PCP)-blood work, stool studies 2/2 diarrhea, BP 191/92, flagyl 500 mg bid x 7d  Recent consult visits: 09/30/20- Potter(Neurology)- donepezil 10 mg qpm  Hospital visits: None in previous 6 months  Objective:  Lab Results  Component Value Date   CREATININE 0.82 09/24/2020   BUN 10 09/24/2020   GFRNONAA 72 09/24/2020   GFRAA 83 09/24/2020   NA 146 (H) 09/24/2020   K 4.0 09/24/2020   CALCIUM 9.2 09/24/2020   CO2 21 09/24/2020   GLUCOSE 115 (H) 09/24/2020    Lab Results  Component Value Date/Time   HGBA1C 5.1 08/07/2020 10:33 AM   HGBA1C 5.6 04/18/2020 04:22 PM   HGBA1C 10.8 (H) 01/15/2014 04:08 PM   MICROALBUR 80 (H) 08/07/2020 10:33 AM   MICROALBUR 10 08/22/2019 01:08 PM    Last diabetic Eye exam:  Lab Results  Component Value Date/Time   HMDIABEYEEXA No Retinopathy 09/10/2015 01:16 PM     Last diabetic Foot exam: No results found for: HMDIABFOOTEX   Lab Results  Component Value Date   CHOL 142 04/18/2020   HDL 41 04/18/2020   LDLCALC 75 04/18/2020   TRIG 152 (H) 04/18/2020   CHOLHDL 4.3 11/26/2016    Hepatic Function Latest Ref Rng & Units 09/24/2020 08/07/2020 04/18/2020  Total Protein 6.0 - 8.5 g/dL 6.2 6.4 6.3  Albumin 3.7 - 4.7 g/dL 4.3 4.3 4.3  AST 0 - 40 IU/L 20 125(H) 51(H)  ALT 0 - 32 IU/L 13 96(H) 64(H)  Alk Phosphatase 44 - 121 IU/L 101 138(H) 90  Total Bilirubin 0.0 - 1.2 mg/dL <0.2 0.5 0.3  Bilirubin, Direct 0.0 - 0.2 mg/dL - - -    Lab Results  Component Value Date/Time   TSH 2.350 03/24/2018 10:43 AM   TSH 1.590 05/25/2017 03:51 PM    CBC Latest Ref Rng & Units 09/24/2020 08/07/2020 03/08/2020  WBC 3.4 - 10.8 x10E3/uL 4.6 6.2 4.6  Hemoglobin 11.1 - 15.9 g/dL 13.8 13.7 11.1(L)  Hematocrit 34.0 - 46.6 % 41.7 41.9 35.3(L)  Platelets 150 - 450 x10E3/uL 112(L) 121(L) 108(L)    No results found for: VD25OH  Clinical ASCVD: Yes  The ASCVD Risk score [name], et al., 2013) failed to calculate for the following reasons:   The patient has a prior MI or stroke diagnosis    Depression screen [facility] 2/9 09/20/2020 05/17/2020 04/22/2020  Decreased Interest 0 1 3  Down, Depressed, Hopeless 0 1 2  PHQ - 2  Score 0 2 5  Altered sleeping 3 2 0  Tired, decreased energy 0 - 0  Change in appetite 3 3 0  Feeling bad or failure about yourself  3 0 0  Trouble concentrating 0 2 0  Moving slowly or fidgety/restless 0 0 0  Suicidal thoughts 0 0 0  PHQ-9 Score 9 9 5   Difficult doing work/chores Not difficult at all Not difficult at all Not difficult at all  Some recent data might be hidden      Social History   Tobacco Use  Smoking Status Current Every Day Smoker  . Packs/day: 1.00  . Types: Cigarettes  Smokeless Tobacco Never Used   BP Readings from Last 3 Encounters:  10/03/20 138/64  09/20/20 (!) 192/91  08/28/20 (!) 153/82   Pulse Readings from  Last 3 Encounters:  10/03/20 65  09/20/20 77  08/28/20 75   Wt Readings from Last 3 Encounters:  10/03/20 159 lb (72.1 kg)  08/28/20 160 lb 3.2 oz (72.7 kg)  08/07/20 156 lb 9.6 oz (71 kg)   BMI Readings from Last 3 Encounters:  10/03/20 27.29 kg/m  08/28/20 27.50 kg/m  08/07/20 26.88 kg/m    Assessment/Interventions: Review of patient past medical history, allergies, medications, health status, including review of consultants reports, laboratory and other test data, was performed as part of comprehensive evaluation and provision of chronic care management services.   SDOH:  (Social Determinants of Health) assessments and interventions performed: No  SDOH Screenings   Alcohol Screen: Not on file  Depression (PHQ2-9): Medium Risk  . PHQ-2 Score: 9  Financial Resource Strain: Low Risk   . Difficulty of Paying Living Expenses: Not hard at all  Food Insecurity: No Food Insecurity  . Worried About Charity fundraiser in the Last Year: Never true  . Ran Out of Food in the Last Year: Never true  Housing: Not on file  Physical Activity: Inactive  . Days of Exercise per Week: 0 days  . Minutes of Exercise per Session: 0 min  Social Connections: Not on file  Stress: No Stress Concern Present  . Feeling of Stress : Not at all  Tobacco Use: High Risk  . Smoking Tobacco Use: Current Every Day Smoker  . Smokeless Tobacco Use: Never Used  Transportation Needs: No Transportation Needs  . Lack of Transportation (Medical): No  . Lack of Transportation (Non-Medical): No     Conditions to be addressed/monitored:  Hypertension, Hyperlipidemia, Diabetes, Heart Failure, Coronary Artery Disease, COPD, Depression and Tobacco use  Care Plan : [place]  Updates made by Vladimir Faster, RPH since 12/13/2020 12:00 AM    Problem: DM, CAD, HF, COPD, tobacco use, HLD,   Priority: High    Long-Range Goal: Disease Management   Start Date: 12/13/2020  This Visit's Progress: On  track  Priority: High  Note:   Current Barriers:  . Chronic Disease Management support, education, and care coordination needs related to Hypertension, Hyperlipidemia, Diabetes, Atrial Fibrillation, Heart Failure, Coronary Artery Disease, COPD, Depression, and Tobacco use   Hypertension BP Readings from Last 3 Encounters:  10/03/20 138/64  09/20/20 (!) 192/91  08/28/20 (!) 153/82   . Pharmacist Clinical Goal(s): o Over the next 60 days, patient will work with PharmD and providers to maintain BP goal <130/80  . Current regimen:   Carvedilol 25 mg bid  Furosemide 40 mg qd  Entresto 49/51 mg bid . Interventions: o Reviewed BP technique and recent readings o Reviewed  fill history o Entresto PAP approved through 08/30/21 . Patient self care activities - Over the next 60 days, patient will: o Check BP daily, document, and provide at future appointments o Ensure daily salt intake < 2300 mg/day o Increase walking as safely tolerated   Hyperlipidemia/CAD/Carotid stent Lab Results  Component Value Date/Time   LDLCALC 75 04/18/2020 04:23 PM   LDLCALC 156 (H) 01/16/2014 12:00 PM   . Pharmacist Clinical Goal(s): o Over the next 60 days, patient will work with PharmD and providers to achieve LDL goal < 70 . Current regimen:  . Crestor 20 mg qd  . NTG 0.4 mg sl prn . Aspirin 81 mg qd . Clopidogrel 75 mg qd . Isosorbide mononitrate 60 mg qd . Interventions: o Reveiwed signs/bleeding and when to consider  emergent . Instructed patient to seek medical  attention any time she falls especially if head involved. o Reviewed fill history o Discussed use of NTG and checking expiration date . Patient self care activities - Over the next 60 days, patient will: o Take all medications as prescribed o Focus on diet and exercise  Diabetes Lab Results  Component Value Date/Time   HGBA1C 5.1 08/07/2020 10:33 AM   HGBA1C 5.6 04/18/2020 04:22 PM   HGBA1C 10.8 (H) 01/15/2014 04:08 PM    . Pharmacist Clinical Goal(s): o Over the next 60 days, patient will work with PharmD and providers to maintain A1c goal <7% . Current regimen:  o Currently holding Trulicity 1.5 mg q week . Interventions: o Reviewed BG reading and counseled to maintain an A1c of <7%, we want to see fasting sugars <130 and 2 hour after meal sugars <180.  o Encouraged patient to check BG occasionally 2-3 hours after meal o Discussed patient was to have follow-up A1c in February. Encouraged her to call the office. She has follow up with PCP in May o PAP for Trulicity approved for year 2022 . Patient self care activities - Over the next 60 days, patient will: o Check blood sugar twice daily, document, and provide at future appointments o Contact provider with any episodes of hypo/hyper glycemia  COPD/Tobacco Abuse . Pharmacist Clinical Goal(s) o Over the next 60 days, patient will work with PharmD and providers to minimize symptoms, alleviate financial barriers . Current regimen:  o Symbicort 2 puffs bid o Albuterol as needed . Interventions: o Will fax completed PAP for renewal of Symbicort o Will collaborate with PCP for albuterol nebules  prescription o Provided smoking cessation counseling . Patient self care activities - Over the next 60 days, patient will: o Take medications as prescribed  Medication management . Pharmacist Clinical Goal(s): o Over the next 60 days, patient will work with PharmD  and providers to achieve optimal medication  adherence . Current pharmacy: [facility] . Interventions o Comprehensive medication review performed. o Utilize [facility] for medication synchronization, packaging and delivery . Patient self care activities - Over the next 60 days, patient will: o Focus on medication adherence by packaging, med sync and delivery o Take medications as prescribed o Report any questions or concerns to PharmD and/or provider(s)       Medication Assistance:  Trulicityobtained through [employer] medication assistance program.  Enrollment ends 08/30/21  Patient's preferred pharmacy is:  [facility] [address] ([address]), [place] - [place]) [place] [address] Phone: [phone] Fax: [phone]  [facility] - [place], [place] - [address] [place] [address] Phone: [phone] Fax: [phone]  Uses pill box? Yes Pt endorses 80% compliance  We discussed: Benefits of medication synchronization, packaging and delivery as well as enhanced pharmacist oversight with [facility]. Patient previously requested enhanced pharmacy service but has decided she would like to stay with current pharmacy. Patient decided to: Continue current medication management strategy  Care Plan and Follow Up Patient Decision:  Patient agrees to Care Plan and Follow-up.  Plan: Telephone follow up appointment with care management team member scheduled for:  1 month  Junita Push. Kenton Kingfisher PharmD, [place] [facility] [phone]

## 2020-12-13 NOTE — Patient Instructions (Addendum)
Visit Information  It was a pleasure speaking with you today. Thank you for letting me be part of your clinical team. Please call with any questions or concerns.   Goals Addressed            This Visit's Progress   . Improve My Heart Health-Coronary Artery Disease       Timeframe:  Long-Range Goal Priority:  High Start Date:                             Expected End Date:                       Follow Up Date 2 month follow up    - be open to making changes - I can manage, know and watch for signs of a heart attack - if I have chest pain, call for help    Why is this important?    Lifestyle changes are key to improving the blood flow to your heart. Think about the things you can change and set a goal to live healthy.   Remember, when the blood vessels to your heart start to get clogged you may not have any symptoms.   Over time, they can get worse.   Don't ignore the signs, like chest pain, and get help right away.     Notes:     . pharmacy care plan       CARE PLAN ENTRY (see longitudinal plan of care for additional care plan information)  Current Barriers:  . Chronic Disease Management support, education, and care coordination needs related to Hypertension, Hyperlipidemia, Diabetes, Atrial Fibrillation, Heart Failure, Coronary Artery Disease, COPD, Depression, and Tobacco use   Hypertension BP Readings from Last 3 Encounters:  10/03/20 138/64  09/20/20 (!) 192/91  08/28/20 (!) 153/82   . Pharmacist Clinical Goal(s): o Over the next 60 days, patient will work with PharmD and providers to maintain BP goal <130/80 . Current regimen:   Carvedilol 12.5mg  qam, 25 mg q pm  Furosemide 20 mg qd  Entresto 24/26 mg bid . Interventions: o Reviewed BP technique and recent readings o Reviewed fill history o Entresto PAP will be faxed for authorization . Patient self care activities - Over the next 60 days, patient will: o Check BP daily, document, and provide at future  appointments o Ensure daily salt intake < 2300 mg/day o Increase walking as safely tolerated   Hyperlipidemia/CAD/Carotid stent Lab Results  Component Value Date/Time   LDLCALC 75 04/18/2020 04:23 PM   LDLCALC 156 (H) 01/16/2014 12:00 PM   . Pharmacist Clinical Goal(s): o Over the next 60 days, patient will work with PharmD and providers to achieve LDL goal < 70 . Current regimen:  . Crestor 20 mg qd  . NTG 0.4 mg sl prn . Aspirin 81 mg qd . Clopidogrel 75 mg qd . Isosorbide mononitrate 60 mg qd . Interventions: o Reveiwed signs/bleeding and when to consider emergent o Reviewed fill history . Patient self care activities - Over the next 60 days, patient will: o Take all medications as prescribed o Focus on diet and exercise  Diabetes Lab Results  Component Value Date/Time   HGBA1C 5.1 08/07/2020 10:33 AM   HGBA1C 5.6 04/18/2020 04:22 PM   HGBA1C 10.8 (H) 01/15/2014 04:08 PM   . Pharmacist Clinical Goal(s): o Over the next 60 days, patient will work with PharmD and providers to maintain  A1c goal <7% . Current regimen:  o Currently holding Trulicity 1.5 mg q week . Interventions: o Reviewed BG reading and counseled to maintain an A1c of <7%, we want to see fasting sugars <130 and 2 hour after meal sugars <180.  o Will discuss Trulicity resumption with PCP o Encouraged patient to check BG occasionally 2-3 hours after meal o Will fax completed PAP renewal for Trulicity. . Patient self care activities - Over the next 60 days, patient will: o Check blood sugar twice daily, document, and provide at future appointments o Contact provider with any episodes of hypoglycemia  COPD/Tobacco Abuse . Pharmacist Clinical Goal(s) o Over the next 60 days, patient will work with PharmD and providers to minimize symptoms, alleviate financial barriers . Current regimen:  o Symbicort 2 puffs bid o Albuterol as needed . Interventions: o Will fax completed PAP for renewal of  Symbicort o Will collaborate with PCP for albuterol nebules prescription o Provided smoking cessation counseling . Patient self care activities - Over the next 60 days, patient will: o Take medications as prescribed  Medication management . Pharmacist Clinical Goal(s): o Over the next 60 days, patient will work with PharmD and providers to achieve optimal medication adherence . Current pharmacy: [facility] . Interventions o Comprehensive medication review performed. o Utilize [facility] pharmacy for medication synchronization, packaging and delivery . Patient self care activities - Over the next 60 days, patient will: o Focus on medication adherence by packaging, med sync and delivery o Take medications as prescribed o Report any questions or concerns to PharmD and/or provider(s)  Initial goal documentation     . Prevent Falls-Dementia       Timeframe:  Long-Range Goal Priority:  High Start Date:                             Expected End Date:                       Follow Up Date 2 month follow-up    - always wear shoes or slippers with non-slip sole - install bathroom grab bars - make an emergency alert plan in case I fall - remove throw rugs or use nonslip pads - use nightlight in the bathroom, halls    Why is this important?    There may be trouble with balance and getting around. Falls can happen.    Notes:        The patient verbalized understanding of instructions, educational materials, and care plan provided today and agreed to receive a mailed copy of patient instructions, educational materials, and care plan.   Telephone follow up appointment with pharmacy team member scheduled for: 1 month  Junita Push. [place] PharmD, BCPS Clinical Pharmacist [phone]  Preventing Diabetes Mellitus Complications You can help to prevent or slow down problems that are caused by diabetes (diabetes mellitus). Following your diabetes plan and taking care of yourself can reduce  your risk of serious or life-threatening complications. What actions can I take to prevent diabetes complications? Diabetes management  Follow instructions from your health care providers about managing your diabetes. Your diabetes may be managed by a team of health care providers who can teach you how to care for yourself and can answer questions that you have.  Educate yourself about your condition so you can make healthy choices about eating and physical activity.  Know your target range for your blood sugar (glucose),  and check your blood glucose level as often as told. Your health care provider will help you decide how often to check your blood glucose level depending on your treatment goals and how well you are meeting them.  Ask your health care provider if you should take low-dose aspirin daily and what dose is recommended for you. Taking low-dose aspirin daily is recommended to help prevent cardiovascular disease.   Controlling your blood pressure and cholesterol Your personal target blood pressure is determined based on:  Your age.  Your medicines.  How long you have had diabetes.  Any other medical conditions you have. To control your blood pressure:  Follow instructions from your health care provider about meal planning, exercise, and medicines.  Make sure your health care provider checks your blood pressure at every medical visit.  Monitor your blood pressure at home as told by your health care provider. To control your cholesterol:  Follow instructions from your health care provider about meal planning, exercise, and medicines.  Have your cholesterol checked at least once a year.  You may be prescribed medicine to lower cholesterol (statin). If you are not taking a statin, ask your health care provider if you should be. Controlling your cholesterol may:  Help prevent heart disease and stroke. These are the most common health problems for people with  diabetes.  Improve your blood flow.   Medical appointments and vaccines Schedule and keep yearly physical exams and eye exams. Your health care provider will tell you how often you need medical visits depending on your diabetes management plan. Keep all follow-up visits as told. This is important so possible problems can be identified early and complications can be avoided or treated.  Every visit with your health care provider should include measuring your: ? Weight. ? Blood pressure. ? Blood glucose control.  Your A1C (hemoglobin A1C) level should be checked: ? At least 2 times a year, if you are meeting your treatment goals. ? 4 times a year, if you are not meeting treatment goals or if your treatment goals have changed.  Your blood lipids (lipid profile) should be checked yearly. You should also be checked yearly for protein in your urine (urine microalbumin).  If you have type 1 diabetes, get an eye exam 3-5 years after you are diagnosed, and then once a year after your first exam.  If you have type 2 diabetes, get an eye exam as soon as you are diagnosed, and then once a year after your first exam. It is also important to keep your vaccines current. It is recommended that you receive:  A flu (influenza) vaccine every year.  A pneumonia (pneumococcal) vaccine and a hepatitis B vaccine. If you are age 54 or older, you may get the pneumonia vaccine as a series of two separate shots. Ask your health care provider which other vaccines may be recommended. Lifestyle  Do not use any products that contain nicotine or tobacco, such as cigarettes, e-cigarettes, and chewing tobacco. If you need help quitting, ask your health care provider. By avoiding nicotine and tobacco: ? You will lower your risk for heart attack, stroke, nerve disease, and kidney disease. ? Your cholesterol and blood pressure may improve. ? Your blood circulation will improve.  If you drink alcohol: ? Limit how much  you use to:  0-1 drink a day for women who are not pregnant.  0-2 drinks a day for men. ? Be aware of how much alcohol is in your drink. In  the [place] one drink equals one 12 oz bottle of beer (355 mL), one 5 oz glass of wine (148 mL), or one 11?2 oz glass of hard liquor (44 mL). Taking care of your feet Diabetes may cause you to have poor blood circulation to your legs and feet. Because of this, taking care of your feet is very important. Diabetes can cause:  The skin on the feet to get thinner, break more easily, and heal more slowly.  Nerve damage in your legs and feet, which results in decreased feeling. You may not notice minor injuries that could lead to serious problems. To avoid foot problems:  Check your skin and feet every day for cuts, bruises, redness, blisters, or sores.  Schedule a foot exam with your health care provider once every year. This exam includes: ? Inspecting the structure and skin of your feet. ? Checking the pulses and sensation in your feet.  Make sure that your health care provider performs a visual foot exam at every medical visit.   Taking care of your teeth People with poorly controlled diabetes are more likely to have gum (periodontal) disease. Diabetes can make periodontal diseases harder to control. If not treated, periodontal diseases can lead to tooth loss. To prevent this:  Brush your teeth twice a day.  Floss at least once a day.  Visit your dentist 2 times a year. Managing stress Living with diabetes can be stressful. When you are experiencing stress, your blood glucose may be affected in two ways:  Stress hormones may cause your blood glucose to rise.  You may be distracted from taking good care of yourself. Be aware of your stress level and make changes to help you manage challenging situations. To lower your stress levels:  Consider joining a support group.  Do planned relaxation or meditation.  Do a hobby that you enjoy.  Maintain  healthy relationships.  Exercise regularly.  Work with your health care provider or a mental health professional. Where to find more information  American Diabetes Association: www.diabetes.org  Association of Diabetes Care and Education Specialists: www.diabeteseducator.org Summary  You can take action to prevent or slow down problems that are caused by diabetes (diabetes mellitus). Following your diabetes plan and taking care of yourself can reduce your risk of serious or life-threatening complications.  Follow instructions from your health care providers about managing your diabetes. Your diabetes may be managed by a team of health care providers who can teach you how to care for yourself and can answer questions that you have.  Know your target range for your blood sugar (glucose), and check your blood glucose levels as often as told. Your health care provider will help you decide how often you should check your blood glucose level depending on your treatment goals and how well you are meeting them.  Your health care provider will tell you how often you need medical visits depending on your diabetes management plan. Keep all follow-up visits as directed. This is important so possible problems can be identified early and complications can be avoided or treated. This information is not intended to replace advice given to you by your health care provider. Make sure you discuss any questions you have with your health care provider. Document Revised: 10/06/2019 Document Reviewed: 10/06/2019 Elsevier Patient Education  2021 [employer].

## 2020-12-19 ENCOUNTER — Other Ambulatory Visit: Payer: Self-pay | Admitting: Family Medicine

## 2020-12-24 ENCOUNTER — Telehealth: Payer: Self-pay

## 2020-12-24 ENCOUNTER — Encounter: Payer: Self-pay | Admitting: Family Medicine

## 2020-12-24 NOTE — Telephone Encounter (Signed)
Lvm to r/s 5/2 appt 

## 2020-12-31 ENCOUNTER — Ambulatory Visit: Payer: Medicare Other | Admitting: Family Medicine

## 2021-01-03 ENCOUNTER — Telehealth: Payer: Self-pay

## 2021-01-03 ENCOUNTER — Telehealth: Payer: Medicare Other

## 2021-01-03 NOTE — Chronic Care Management (AMB) (Signed)
  Care Management   Note  01/03/2021 Name: RINI MOFFIT MRN: [id number] DOB: 11-21-1945  Sarah White is a 75 y.o. year old female who is a primary care patient of Valerie Roys, DO and is actively engaged with the care management team. I reached out to [name] by phone today to assist with re-scheduling a follow up visit with the RN Case Manager  Follow up plan: Unsuccessful telephone outreach attempt made. A HIPAA compliant phone message was left for the patient providing contact information and requesting a return call.  The care management team will reach out to the patient again over the next 5 days.  If patient returns call to provider office, please advise to call [facility], [place], [place], [place] [address] Direct Dial: [phone] [email or web address]: [email or web address]

## 2021-01-03 NOTE — Telephone Encounter (Signed)
Copied from [place] [id number]. Topic: General - Other >> Jan 03, 2021  3:37 PM Keene Breath wrote: Reason for CRM: Patient is returning a call to the office regarding her appt.  Please call patient back at [phone]   Pt would like to speak with nurse pam about the apt that she was suppose to have on 01/03/2021

## 2021-01-06 ENCOUNTER — Telehealth: Payer: Self-pay | Admitting: Pharmacist

## 2021-01-06 NOTE — Chronic Care Management (AMB) (Signed)
Chronic Care Management Pharmacy Assistant   Name: Sarah White  MRN: [id number] DOB: 19-May-1946   Reason for Encounter: Disease State Hypertension and Diabetes Mellitus    Recent office visits:  None noted  Recent consult visits:  None noted  Hospital visits:  None in previous 6 months  Medications: Outpatient Encounter Medications as of 01/06/2021  Medication Sig Note  . albuterol (ACCUNEB) 0.63 MG/3ML nebulizer solution Take 3 mLs (0.63 mg total) by nebulization every 6 (six) hours as needed for wheezing.   Marland Kitchen albuterol (VENTOLIN HFA) 108 (90 Base) MCG/ACT inhaler Inhale 2 puffs into the lungs every 6 (six) hours as needed for wheezing or shortness of breath. (Patient not taking: No sig reported) April 13, 2020: Taking 2 puffs BID  . amiodarone (PACERONE) 200 MG tablet Take 100 mg by mouth daily.    Marland Kitchen aspirin EC 81 MG tablet Take 81 mg by mouth at bedtime.    . budesonide-formoterol (SYMBICORT) 160-4.5 MCG/ACT inhaler Inhale 2 puffs into the lungs 2 (two) times daily.   Marland Kitchen buPROPion (WELLBUTRIN SR) 150 MG 12 hr tablet Take 1 tablet by mouth twice daily   . carvedilol (COREG) 12.5 MG tablet TAKE 2 TABLETS BY MOUTH TWICE DAILY WITH A MEAL   . clopidogrel (PLAVIX) 75 MG tablet Take 1 tablet (75 mg total) by mouth daily at 6 (six) AM.   . cyclobenzaprine (FLEXERIL) 10 MG tablet TAKE 1 TABLET BY MOUTH THREE TIMES DAILY AS NEEDED FOR  MUSCLE  SPASMS. DO NOT DRIVE ON THIS MEDICATION. WILL MAKE YOU SLEEPY.   . diphenhydrAMINE (BENADRYL) 25 MG tablet Take 25 mg by mouth at bedtime as needed for allergies. Dies not take regularly 04/13/20: Taking 1 QPM  . donepezil (ARICEPT) 5 MG tablet Take 10 mg by mouth daily.   . furosemide (LASIX) 40 MG tablet Take 1 tablet by mouth once daily   . isosorbide mononitrate (IMDUR) 60 MG 24 hr tablet Take 60 mg by mouth daily.   . Multiple Vitamin (MULTIVITAMIN WITH MINERALS) TABS tablet Take 1 tablet by mouth daily. (Patient not taking: No sig reported)    . nitroGLYCERIN (NITROSTAT) 0.4 MG SL tablet Place 1 tablet (0.4 mg total) under the tongue every 5 (five) minutes as needed for chest pain.   Marland Kitchen ondansetron (ZOFRAN-ODT) 4 MG disintegrating tablet Take 1 tablet (4 mg total) by mouth every 8 (eight) hours as needed for nausea or vomiting. (Patient not taking: No sig reported)   . potassium chloride SA (KLOR-CON) 20 MEQ tablet Take 1 tablet (20 mEq total) by mouth daily. (Patient not taking: Reported on 11/29/2020)   . QUEtiapine (SEROQUEL XR) 50 MG TB24 24 hr tablet Take 1 tablet (50 mg total) by mouth at bedtime.   . rosuvastatin (CRESTOR) 20 MG tablet Take 1 tablet (20 mg total) by mouth daily.   . sacubitril-valsartan (ENTRESTO) 49-51 MG Take 1 tablet by mouth 2 (two) times daily.    . sertraline (ZOLOFT) 100 MG tablet Take 2 tablets by mouth once daily   . silver sulfADIAZINE (SILVADENE) 1 % cream Apply 1 application topically daily. (Patient taking differently: Apply 1 application topically as needed.)   . tiotropium (SPIRIVA HANDIHALER) 18 MCG inhalation capsule PLACE 1 CAPSULE INTO INHALER AND INHALE DAILY   . traZODone (DESYREL) 50 MG tablet TAKE 1/2 TO 1 (ONE-HALF TO ONE) TABLET BY MOUTH AT BEDTIME AS NEEDED FOR SLEEP   . triamcinolone ointment (KENALOG) 0.5 % Apply 1 application topically 2 (two) times daily. (  Patient not taking: No sig reported)    No facility-administered encounter medications on file as of 01/06/2021.   Recent Relevant Labs: Lab Results  Component Value Date/Time   HGBA1C 5.1 08/07/2020 10:33 AM   HGBA1C 5.6 04/18/2020 04:22 PM   HGBA1C 10.8 (H) 01/15/2014 04:08 PM   MICROALBUR 80 (H) 08/07/2020 10:33 AM   MICROALBUR 10 08/22/2019 01:08 PM    Kidney Function Lab Results  Component Value Date/Time   CREATININE 0.82 09/24/2020 02:02 PM   CREATININE 0.83 08/19/2020 01:44 PM   CREATININE 0.52 (L) 01/15/2014 03:56 PM   GFRNONAA 72 09/24/2020 02:02 PM   GFRNONAA >60 01/15/2014 03:56 PM   GFRAA 83 09/24/2020 02:02  PM   GFRAA >60 01/15/2014 03:56 PM    . Current antihyperglycemic regimen:  ? Currently holding Trulicity 1.5 mg q week ( patient states she uses her trulicity when her blood sugars are high.)  . What recent interventions/DTPs have been made to improve glycemic control:  o None noted  . Have there been any recent hospitalizations or ED visits since last visit with CPP? No   . Patient denies hypoglycemic symptoms, including Pale, Sweaty, Shaky, Hungry, Nervous/irritable and Vision changes   . Patient reports hyperglycemic symptoms, including agitation and nervousness   . How often are you checking your blood sugar? Every 1 or 2 days days  . What are your blood sugars ranging?  o Fasting:  o Before meals:  o After meals: 102 o Bedtime:   . During the week, how often does your blood glucose drop below 70? Never   . Are you checking your feet daily/regularly?  o Patient states she checks her feet daily.  Adherence Review: Is the patient currently on a STATIN medication? Yes Is the patient currently on ACE/ARB medication? No Does the patient have >5 day gap between last estimated fill dates? Yes   Reviewed chart prior to disease state call. Spoke with patient regarding BP  Recent Office Vitals: BP Readings from Last 3 Encounters:  10/03/20 138/64  09/20/20 (!) 192/91  08/28/20 (!) 153/82   Pulse Readings from Last 3 Encounters:  10/03/20 65  09/20/20 77  08/28/20 75    Wt Readings from Last 3 Encounters:  10/03/20 159 lb (72.1 kg)  08/28/20 160 lb 3.2 oz (72.7 kg)  08/07/20 156 lb 9.6 oz (71 kg)     Kidney Function Lab Results  Component Value Date/Time   CREATININE 0.82 09/24/2020 02:02 PM   CREATININE 0.83 08/19/2020 01:44 PM   CREATININE 0.52 (L) 01/15/2014 03:56 PM   GFRNONAA 72 09/24/2020 02:02 PM   GFRNONAA >60 01/15/2014 03:56 PM   GFRAA 83 09/24/2020 02:02 PM   GFRAA >60 01/15/2014 03:56 PM    BMP Latest Ref Rng & Units 09/24/2020 08/19/2020  08/07/2020  Glucose 65 - 99 mg/dL 115(H) 106(H) 61(L)  BUN 8 - 27 mg/dL 10 9 10   Creatinine 0.57 - 1.00 mg/dL 0.82 0.83 0.80  BUN/Creat Ratio 12 - 28 12 11(L) 13  Sodium 134 - 144 mmol/L 146(H) 140 139  Potassium 3.5 - 5.2 mmol/L 4.0 3.7 2.7(L)  Chloride 96 - 106 mmol/L 106 101 94(L)  CO2 20 - 29 mmol/L 21 25 26   Calcium 8.7 - 10.3 mg/dL 9.2 8.7 9.3    . Current antihypertensive regimen:   Carvedilol 25 mg bid  Furosemide 40 mg qd  Entresto 49/51 mg bid  . How often are you checking your Blood Pressure? Every 1 or 2 days  .  Current home BP readings: 128/79 01/05/21  . What recent interventions/DTPs have been made by any provider to improve Blood Pressure control since last CPP Visit: None noted  . Any recent hospitalizations or ED visits since last visit with CPP? No   . What diet changes have been made to improve Blood Pressure Control?  o Patient states she limits her salt intake. Patient states she does not eat much and when she does it is small amounts.  . What exercise is being done to improve your Blood Pressure Control?  o Patient states she does not exercise because she often feels off balance and falls frequently.Patient states she tries to use a walker.   Star Rating Drugs: Rosuvastatin 20 mg Last filled:07/09/2020 90DS  [facility] Clinical Pharmacist Assistant [phone]

## 2021-01-07 ENCOUNTER — Ambulatory Visit: Payer: Medicare Other | Admitting: Family Medicine

## 2021-01-07 NOTE — Chronic Care Management (AMB) (Signed)
  Care Management   Note  01/07/2021 Name: [name] MRN: [id number] DOB: [birth year]  Sarah White is a 75 y.o. year old female who is a primary care patient of Valerie Roys, DO and is actively engaged with the care management team. I reached out to [name] by phone today to assist with re-scheduling a follow up visit with the RN Case Manager  Follow up plan: Unsuccessful telephone outreach attempt made. A HIPAA compliant phone message was left for the patient providing contact information and requesting a return call.  The care management team will reach out to the patient again over the next 7 days.  If patient returns call to provider office, please advise to call [facility]  at [facility], [place], [place], [place] [address] Direct Dial: [phone] [email or web address] Website: [email or web address]

## 2021-01-21 NOTE — Telephone Encounter (Signed)
3rd unsuccessful outreach  

## 2021-01-21 NOTE — Chronic Care Management (AMB) (Signed)
  Care Management   Note  01/21/2021 Name: Sarah White MRN: [id number] DOB: 1945/12/30  Sarah White is a 75 y.o. year old female who is a primary care patient of Valerie Roys, DO and is actively engaged with the care management team. I reached out to [name] by phone today to assist with re-scheduling a follow up visit with the RN Case Manager  Follow up plan: Unable to make contact on outreach attempts x 3. PCP Valerie Roys, DO notified via routed documentation in medical record.   [facility], [place], [place], [place] [address] Direct Dial: [phone] [email or web address] Website: [email or web address]

## 2021-02-03 ENCOUNTER — Encounter (INDEPENDENT_AMBULATORY_CARE_PROVIDER_SITE_OTHER): Payer: Medicare Other

## 2021-02-03 ENCOUNTER — Ambulatory Visit (INDEPENDENT_AMBULATORY_CARE_PROVIDER_SITE_OTHER): Payer: Medicare Other | Admitting: Vascular Surgery

## 2021-02-05 ENCOUNTER — Telehealth: Payer: Medicare Other

## 2021-02-05 ENCOUNTER — Telehealth: Payer: Self-pay | Admitting: Pharmacist

## 2021-02-05 ENCOUNTER — Ambulatory Visit: Payer: Medicare Other

## 2021-02-05 NOTE — Progress Notes (Unsigned)
Chronic Care Management Pharmacy Note  02/05/2021 Name:  Sarah White MRN:  [id number] DOB:  07-03-1946  Subjective: Sarah White is an 75 y.o. year old female who is a primary patient of Valerie Roys, DO.  The CCM team was consulted for assistance with disease management and care coordination needs.    Engaged with patient by telephone for follow up visit in response to provider referral for pharmacy case management and/or care coordination services.   Consent to Services:  The patient was given information about Chronic Care Management services, agreed to services, and gave verbal consent prior to initiation of services.  Please see initial visit note for detailed documentation.   [name], [name] Yolonda Kida, MD as Consulting Physician (Cardiology) Yolonda Kida, MD as Consulting Physician (Cardiology) Vanita Ingles, RN as Case Manager ([place]) Vladimir Faster, [facility] (Pharmacist)  Recent office visits: 09/20/20-Johnson (PCP)  diarrhea, stool work up negative, cbc with diff, cmp, flagyl 500 mg bid started, BP 192/91 2/03/22Wynetta Emery (PCP) follow up , Flagyl d/c, bp 138/64, 10/06/20- Patient called to report falling in [place] bathroom and hitting head- recommend ems  Recent consult visits: 4 09/30/20- [facility](Neurology)- aricept 10 mg qpm  For worsening memory loss and hallucinations, will check tsh and B12 next visit  Hospital visits: None in previous 6 months  Objective:  Lab Results  Component Value Date   CREATININE 0.82 09/24/2020   BUN 10 09/24/2020   GFRNONAA 72 09/24/2020   GFRAA 83 09/24/2020   NA 146 (H) 09/24/2020   K 4.0 09/24/2020   CALCIUM 9.2 09/24/2020   CO2 21 09/24/2020   GLUCOSE 115 (H) 09/24/2020    Lab Results  Component Value Date/Time   HGBA1C 5.1 08/07/2020 10:33 AM   HGBA1C 5.6 04/18/2020 04:22 PM   HGBA1C 10.8 (H) 01/15/2014 04:08 PM   MICROALBUR 80 (H)  08/07/2020 10:33 AM   MICROALBUR 10 08/22/2019 01:08 PM    Last diabetic Eye exam:  Lab Results  Component Value Date/Time   HMDIABEYEEXA No Retinopathy 09/10/2015 01:16 PM    Last diabetic Foot exam: No results found for: HMDIABFOOTEX   Lab Results  Component Value Date   CHOL 142 04/18/2020   HDL 41 04/18/2020   LDLCALC 75 04/18/2020   TRIG 152 (H) 04/18/2020   CHOLHDL 4.3 11/26/2016    Hepatic Function Latest Ref Rng & Units 09/24/2020 08/07/2020 04/18/2020  Total Protein 6.0 - 8.5 g/dL 6.2 6.4 6.3  Albumin 3.7 - 4.7 g/dL 4.3 4.3 4.3  AST 0 - 40 IU/L 20 125(H) 51(H)  ALT 0 - 32 IU/L 13 96(H) 64(H)  Alk Phosphatase 44 - 121 IU/L 101 138(H) 90  Total Bilirubin 0.0 - 1.2 mg/dL <0.2 0.5 0.3  Bilirubin, Direct 0.0 - 0.2 mg/dL - - -    Lab Results  Component Value Date/Time   TSH 2.350 03/24/2018 10:43 AM   TSH 1.590 05/25/2017 03:51 PM    CBC Latest Ref Rng & Units 09/24/2020 08/07/2020 03/08/2020  WBC 3.4 - 10.8 x10E3/uL 4.6 6.2 4.6  Hemoglobin 11.1 - 15.9 g/dL 13.8 13.7 11.1(L)  Hematocrit 34.0 - 46.6 % 41.7 41.9 35.3(L)  Platelets 150 - 450 x10E3/uL 112(L) 121(L) 108(L)    No results found for: VD25OH  Clinical ASCVD: Yes  The ASCVD Risk score [name], et al., 2013) failed to calculate for the following reasons:   The patient has  a prior MI or stroke diagnosis    Depression screen [facility] 2/9 09/20/2020 05/17/2020 04/22/2020  Decreased Interest 0 1 3  Down, Depressed, Hopeless 0 1 2  PHQ - 2 Score 0 2 5  Altered sleeping 3 2 0  Tired, decreased energy 0 - 0  Change in appetite 3 3 0  Feeling bad or failure about yourself  3 0 0  Trouble concentrating 0 2 0  Moving slowly or fidgety/restless 0 0 0  Suicidal thoughts 0 0 0  PHQ-9 Score _0 Difficult doing work/chores Not difficult at all Not difficult at all Not difficult at all  Some recent data might be hidden       Social History   Tobacco Use  Smoking Status Current Every Day Smoker  . Packs/day:  1.00  . Types: Cigarettes  Smokeless Tobacco Never Used   BP Readings from Last 3 Encounters:  10/03/20 138/64  09/20/20 (!) 192/91  08/28/20 (!) 153/82   Pulse Readings from Last 3 Encounters:  10/03/20 65  09/20/20 77  08/28/20 75   Wt Readings from Last 3 Encounters:  10/03/20 159 lb (72.1 kg)  08/28/20 160 lb 3.2 oz (72.7 kg)  08/07/20 156 lb 9.6 oz (71 kg)   BMI Readings from Last 3 Encounters:  10/03/20 27.29 kg/m  08/28/20 27.50 kg/m  08/07/20 26.88 kg/m    Assessment/Interventions: Review of patient past medical history, allergies, medications, health status, including review of consultants reports, laboratory and other test data, was performed as part of comprehensive evaluation and provision of chronic care management services.   SDOH:  (Social Determinants of Health) assessments and interventions performed: No  SDOH Screenings   Alcohol Screen: Not on file  Depression (PHQ2-9): Medium Risk  . PHQ-2 Score: 9  Financial Resource Strain: Low Risk   . Difficulty of Paying Living Expenses: Not hard at all  Food Insecurity: No Food Insecurity  . Worried About Charity fundraiser in the Last Year: Never true  . Ran Out of Food in the Last Year: Never true  Housing: Not on file  Physical Activity: Inactive  . Days of Exercise per Week: 0 days  . Minutes of Exercise per Session: 0 min  Social Connections: Not on file  Stress: No Stress Concern Present  . Feeling of Stress : Not at all  Tobacco Use: High Risk  . Smoking Tobacco Use: Current Every Day Smoker  . Smokeless Tobacco Use: Never Used  Transportation Needs: No Transportation Needs  . Lack of Transportation (Medical): No  . Lack of Transportation (Non-Medical): No     Immunization History  Administered Date(s) Administered  . Fluad Quad(high Dose 65+) 04/28/2019, 05/24/2020  . Influenza, High Dose Seasonal PF 05/18/2016, 05/25/2017, 05/05/2018  . Influenza, Seasonal, Injecte, Preservative Fre  06/05/2015  . PFIZER(Purple Top)SARS-COV-2 Vaccination 10/16/2019, 11/06/2019, 09/24/2020  . Pneumococcal Conjugate-13 07/29/2015  . Pneumococcal Polysaccharide-23 09/15/2016  . Tdap 09/24/2015  . Zoster, Live 02/07/2015    Conditions to be addressed/monitored:  Hypertension, Hyperlipidemia, Diabetes, Heart Failure, Coronary Artery Disease, COPD, Depression and memory issues and frequent falls  There are no care plans that you recently modified to display for this patient.    Medication Assistance: [name], Symbicort and Trulicityobtained through Manufacturer medication assistance program.  Enrollment ends 08/30/21  Patient's preferred pharmacy is:  [facility] [address] ([address]), [place] [address] [place]) [place] [address] Phone: [phone] Fax: [phone]  [facility] -  [place], [place] - [address] [place] [address] Phone: [phone] Fax: [phone]  Uses pill box? Yes Pt endorses % compliance  We discussed: Benefits of medication synchronization, packaging and delivery as well as enhanced pharmacist oversight with [facility]. Patient decided to: Continue current medication management strategy. Patient states [facility] takes really good care of her and she wants to stay with them for now.  Care Plan and Follow Up Patient Decision:  Patient agrees to Care Plan and Follow-up.  Plan: Telephone follow up appointment with care management team member scheduled for:  2 months  Junita Push. Kenton Kingfisher PharmD, BCPS Clinical Pharmacist [facility] [phone]  Current Barriers:  . Unable to independently afford treatment regimen . Unable to independently monitor therapeutic efficacy . Unable to maintain control of medical conditions . Unable to self administer medications as prescribed . Does not contact provider office for questions/concerns . Memory  decline .   Pharmacist Clinical Goal(s):  Marland Kitchen Patient will verbalize ability to afford treatment regimen . achieve adherence to monitoring guidelines and medication adherence to achieve therapeutic efficacy . achieve control of DM, HTN, HLD as evidenced by lab values . achieve ability to self administer medications as prescribed through use of pill box, fill dates, assessment calls from pharmacy team as evidenced by patient report . adhere to prescribed medication regimen as evidenced by fill dates . contact provider office for questions/concerns as evidenced notation of same in electronic health record through collaboration with PharmD and provider.  .   Interventions: . 1:1 collaboration with Valerie Roys, DO regarding development and update of comprehensive plan of care as evidenced by provider attestation and co-signature . Inter-disciplinary care team collaboration (see longitudinal plan of care) . Comprehensive medication review performed; medication list updated in electronic medical record .   BP Readings from Last 3 Encounters:  10/03/20 138/64  09/20/20 (!) 192/91  08/28/20 (!) 153/82   NSVT (Goal: prevent stroke and major bleeding) -Controlled -CHADSVASC: 6       This score is not applicable to this patient. Components are not  calculated.     -Current treatment: . Rate control:  Carvedilol 25 mg bid . Rhythm control: amiodarone 100 mg qd . Anticoagulation: NA -Medications previously tried: *** -Home BP and HR readings: ***  -Counseled on {CCMAFIBCOUNSELING:25120} -{CCMPHARMDINTERVENTION:25122} -need follow up with cardiology (last visit 04/10/20) to return in 3 months -Need follow up with vascular (last 08/05/20) return in 6 months   Hypertension (BP goal <140/90) -Not ideally controlled -Current treatment: . Carvedilol 25 mg bid . Entresto 49/51 mg bid . Furosemide 20 mg qd . klorcon 20 meq qd -Medications previously tried: NA -Current home readings:  140/80 -Current dietary habits: Sleeps late so usually eats leftovers or oatmeal, when she gets up and will eat supper. Cooked twice this week. Italian noodles with brussel sprouts and baby lima beans.  -Current exercise habits: Uses a walker in home. No structured exercise -Denies hypotensive/hypertensive symptoms -Educated on BP goals and benefits of medications for prevention of heart attack, stroke and kidney damage; Daily salt intake goal < 2300 mg; Proper BP monitoring technique; Symptoms of hypotension and importance of maintaining adequate hydration; -Counseled to monitor BP at home three times weekly, document, and provide log at future appointments -Recommended to continue current medication  -Reviewed fill history   Lab Results  Component Value Date   [place] 75 04/18/2020    Hyperlipidemia/CAD/Unstable  Angina/MI/ Carotid stenosis s/p  stent: (LDL goal < 70) -Not ideally controlled -Current treatment: . Rosuvastatin 20 mg qd . ASA 81 mg qd . Clopidogrel 75 mg qd . Isosorbide mononitrate 60 mg qd . NTG SL 0.4 mg prn - -Educated on Cholesterol goals;  Benefits of statin for ASCVD risk reduction; -Recommended to continue current medication Counseled on signs and symptoms of bleeding.   Diabetes (A1c goal <7%) -Controlled -Current medications: . Trulicity 1.5 mg q 2-3 weeks -Medications previously tried: NA  -Current home glucose readings . fasting glucose: 141 . post prandial glucose: not checking -Denies hypoglycemic/hyperglycemic symptoms -Current meal patterns:  Last dose 3 weeks ago will take if 170,180, 190 . Patient reports checking BG daily and taking a dose of Trulicity when her FBG stays elevated for 2-3 days in a row. She does not take more often than every 2 weeks and never > 1 dose per week per her report -Educated on {CCM DM COUNSELING:25123} -Counseled to check feet daily and get yearly eye exams -{CCMPHARMDINTERVENTION:25122} PAP?   Heart Failure  (Goal: manage symptoms and prevent exacerbations) -{US controlled/uncontrolled:25276} -Last ejection fraction: 30-35%(Date: 11/2019) -HF type: {type of heart failure:30421350} -NYHA Class: {CHL HP [facility] NYHA Class:[phone]} -AHA HF Stage: {CHL HP [facility] AHA HF Stage:[phone]} -Current treatment: . *** -Medications previously tried: ***  -Current home BP/HR readings: *** -Current dietary habits: *** -Current exercise habits: *** -Educated on {CCM HF Counseling:25125} -{CCMPHARMDINTERVENTION:25122}  COPD (Goal: control symptoms and prevent exacerbations) -{US controlled/uncontrolled:25276} -Current treatment  . Symbicort  2 puffs bid . Albuterol prn -Medications previously tried: ***  -Gold Grade: {CHL HP [facility] COPD Gold OIZTI:[id number]} -Current COPD Classification:  {CHL HP [facility] COPD [phone]} -MMRC/CAT score: *** -Pulmonary function testing: *** -Exacerbations requiring treatment in last 6 months: *** -Patient {Actions; denies-reports:120008} consistent use of maintenance inhaler -Frequency of rescue inhaler use: *** -Counseled on {CCMINHALERCOUNSELING:25121} -{CCMPHARMDINTERVENTION:25122} PAP?  Depression/Anxiety (Goal: ***) -{US controlled/uncontrolled:25276} -Current treatment: . *** -Medications previously tried/failed: *** -PHQ9: *** -GAD7: *** -Connected with *** for mental health support -Educated on {CCM mental health counseling:25127} -{CCMPHARMDINTERVENTION:25122}  Tobacco use (Goal ***) -{US controlled/uncontrolled:25276} -Previous quit attempts: *** -Current treatment  . *** -Patient smokes {Time to first cigarette:23873} -Patient triggers include: {Smoking Triggers:23882} -On a scale of 1-10, reports MOTIVATION to quit is *** -On a scale of 1-10, reports CONFIDENCE in quitting is *** -{Smoking Cessation Counseling:23883} -{CCMPHARMDINTERVENTION:25122}  *** (Goal: ***) -{US  controlled/uncontrolled:25276} -Current treatment  . *** -Medications previously tried: ***  -{CCMPHARMDINTERVENTION:25122}   Patient Goals/Self-Care Activities . Patient will:  - {pharmacypatientgoals:24919}  Follow Up Plan: {CM FOLLOW UP SNKN:39767}

## 2021-02-26 ENCOUNTER — Telehealth: Payer: Self-pay | Admitting: Pharmacist

## 2021-02-26 NOTE — Chronic Care Management (AMB) (Signed)
Chronic Care Management Pharmacy Assistant   Name: Sarah White  MRN: [id number] DOB: 1945-10-08   Reason for Encounter: Chart Review    Medications: Outpatient Encounter Medications as of 02/26/2021  Medication Sig Note   albuterol (ACCUNEB) 0.63 MG/3ML nebulizer solution Take 3 mLs (0.63 mg total) by nebulization every 6 (six) hours as needed for wheezing.    albuterol (VENTOLIN HFA) 108 (90 Base) MCG/ACT inhaler Inhale 2 puffs into the lungs every 6 (six) hours as needed for wheezing or shortness of breath. (Patient not taking: No sig reported) 04-22-2020: Taking 2 puffs BID   amiodarone (PACERONE) 200 MG tablet Take 100 mg by mouth daily.     aspirin EC 81 MG tablet Take 81 mg by mouth at bedtime.     budesonide-formoterol (SYMBICORT) 160-4.5 MCG/ACT inhaler Inhale 2 puffs into the lungs 2 (two) times daily.    buPROPion (WELLBUTRIN SR) 150 MG 12 hr tablet Take 1 tablet by mouth twice daily    carvedilol (COREG) 12.5 MG tablet TAKE 2 TABLETS BY MOUTH TWICE DAILY WITH A MEAL    clopidogrel (PLAVIX) 75 MG tablet Take 1 tablet (75 mg total) by mouth daily at 6 (six) AM.    cyclobenzaprine (FLEXERIL) 10 MG tablet TAKE 1 TABLET BY MOUTH THREE TIMES DAILY AS NEEDED FOR  MUSCLE  SPASMS. DO NOT DRIVE ON THIS MEDICATION. WILL MAKE YOU SLEEPY.    diphenhydrAMINE (BENADRYL) 25 MG tablet Take 25 mg by mouth at bedtime as needed for allergies. Dies not take regularly April 22, 2020: Taking 1 QPM   donepezil (ARICEPT) 5 MG tablet Take 10 mg by mouth daily.    furosemide (LASIX) 40 MG tablet Take 1 tablet by mouth once daily    isosorbide mononitrate (IMDUR) 60 MG 24 hr tablet Take 60 mg by mouth daily.    Multiple Vitamin (MULTIVITAMIN WITH MINERALS) TABS tablet Take 1 tablet by mouth daily. (Patient not taking: No sig reported)    nitroGLYCERIN (NITROSTAT) 0.4 MG SL tablet Place 1 tablet (0.4 mg total) under the tongue every 5 (five) minutes as needed for chest pain.    ondansetron (ZOFRAN-ODT) 4 MG  disintegrating tablet Take 1 tablet (4 mg total) by mouth every 8 (eight) hours as needed for nausea or vomiting. (Patient not taking: No sig reported)    potassium chloride SA (KLOR-CON) 20 MEQ tablet Take 1 tablet (20 mEq total) by mouth daily. (Patient not taking: Reported on 11/29/2020)    QUEtiapine (SEROQUEL XR) 50 MG TB24 24 hr tablet Take 1 tablet (50 mg total) by mouth at bedtime.    rosuvastatin (CRESTOR) 20 MG tablet Take 1 tablet (20 mg total) by mouth daily.    sacubitril-valsartan (ENTRESTO) 49-51 MG Take 1 tablet by mouth 2 (two) times daily.     sertraline (ZOLOFT) 100 MG tablet Take 2 tablets by mouth once daily    silver sulfADIAZINE (SILVADENE) 1 % cream Apply 1 application topically daily. (Patient taking differently: Apply 1 application topically as needed.)    tiotropium (SPIRIVA HANDIHALER) 18 MCG inhalation capsule PLACE 1 CAPSULE INTO INHALER AND INHALE DAILY    traZODone (DESYREL) 50 MG tablet TAKE 1/2 TO 1 (ONE-HALF TO ONE) TABLET BY MOUTH AT BEDTIME AS NEEDED FOR SLEEP    triamcinolone ointment (KENALOG) 0.5 % Apply 1 application topically 2 (two) times daily. (Patient not taking: No sig reported)    No facility-administered encounter medications on file as of 02/26/2021.    Reviewed chart for medication changes and  adherence.  No OVs, Consults, or hospital visits since last care coordination call / Pharmacist visit. No medication changes indicated  No gaps in adherence identified. Patient has follow up scheduled with pharmacy team. No further action required.  Corrie Mckusick, [place]

## 2021-03-10 ENCOUNTER — Other Ambulatory Visit: Payer: Self-pay | Admitting: Family Medicine

## 2021-04-23 ENCOUNTER — Ambulatory Visit: Payer: Medicare Other

## 2021-04-25 ENCOUNTER — Ambulatory Visit (INDEPENDENT_AMBULATORY_CARE_PROVIDER_SITE_OTHER): Payer: Medicare Other

## 2021-04-25 VITALS — BP 139/80 | HR 66 | Ht 64.0 in | Wt 145.0 lb

## 2021-04-25 DIAGNOSIS — Z0000 Encounter for general adult medical examination without abnormal findings: Secondary | ICD-10-CM

## 2021-04-25 NOTE — Patient Instructions (Signed)
Sarah White , Thank you for taking time to come for your Medicare Wellness Visit. I appreciate your ongoing commitment to your health goals. Please review the following plan we discussed and let me know if I can assist you in the future.   Screening recommendations/referrals: Colonoscopy: declines Mammogram: declines Bone Density: declines Recommended yearly ophthalmology/optometry visit for glaucoma screening and checkup Recommended yearly dental visit for hygiene and checkup  Vaccinations: Influenza vaccine: due Pneumococcal vaccine: completed 09/15/2016 Tdap vaccine: completed 09/24/2015, due 09/23/2025 Shingles vaccine: discussed   Covid-19:09/24/2020, 11/06/2019, 10/16/2019  Advanced directives: copy in chart  Conditions/risks identified: smoking  Next appointment: Follow up in one year for your annual wellness visit    Preventive Care 37 Years and Older, Female Preventive care refers to lifestyle choices and visits with your health care provider that can promote health and wellness. What does preventive care include? A yearly physical exam. This is also called an annual well check. Dental exams once or twice a year. Routine eye exams. Ask your health care provider how often you should have your eyes checked. Personal lifestyle choices, including: Daily care of your teeth and gums. Regular physical activity. Eating a healthy diet. Avoiding tobacco and drug use. Limiting alcohol use. Practicing safe sex. Taking low-dose aspirin every day. Taking vitamin and mineral supplements as recommended by your health care provider. What happens during an annual well check? The services and screenings done by your health care provider during your annual well check will depend on your age, overall health, lifestyle risk factors, and family history of disease. Counseling  Your health care provider may ask you questions about your: Alcohol use. Tobacco use. Drug use. Emotional  well-being. Home and relationship well-being. Sexual activity. Eating habits. History of falls. Memory and ability to understand (cognition). Work and work Statistician. Reproductive health. Screening  You may have the following tests or measurements: Height, weight, and BMI. Blood pressure. Lipid and cholesterol levels. These may be checked every 5 years, or more frequently if you are over 87 years old. Skin check. Lung cancer screening. You may have this screening every year starting at age 27 if you have a 30-pack-year history of smoking and currently smoke or have quit within the past 15 years. Fecal occult blood test (FOBT) of the stool. You may have this test every year starting at age 24. Flexible sigmoidoscopy or colonoscopy. You may have a sigmoidoscopy every 5 years or a colonoscopy every 10 years starting at age 66. Hepatitis C blood test. Hepatitis B blood test. Sexually transmitted disease (STD) testing. Diabetes screening. This is done by checking your blood sugar (glucose) after you have not eaten for a while (fasting). You may have this done every 1-3 years. Bone density scan. This is done to screen for osteoporosis. You may have this done starting at age 63. Mammogram. This may be done every 1-2 years. Talk to your health care provider about how often you should have regular mammograms. Talk with your health care provider about your test results, treatment options, and if necessary, the need for more tests. Vaccines  Your health care provider may recommend certain vaccines, such as: Influenza vaccine. This is recommended every year. Tetanus, diphtheria, and acellular pertussis (Tdap, Td) vaccine. You may need a Td booster every 10 years. Zoster vaccine. You may need this after age 58. Pneumococcal 13-valent conjugate (PCV13) vaccine. One dose is recommended after age 55. Pneumococcal polysaccharide (PPSV23) vaccine. One dose is recommended after age 49. Talk to your  health care provider about which screenings and vaccines you need and how often you need them. This information is not intended to replace advice given to you by your health care provider. Make sure you discuss any questions you have with your health care provider. Document Released: 09/13/2015 Document Revised: 05/06/2016 Document Reviewed: 06/18/2015 Elsevier Interactive Patient Education  2017 [employer] Prevention in the Home Falls can cause injuries. They can happen to people of all ages. There are many things you can do to make your home safe and to help prevent falls. What can I do on the outside of my home? Regularly fix the edges of walkways and driveways and fix any cracks. Remove anything that might make you trip as you walk through a door, such as a raised step or threshold. Trim any bushes or trees on the path to your home. Use bright outdoor lighting. Clear any walking paths of anything that might make someone trip, such as rocks or tools. Regularly check to see if handrails are loose or broken. Make sure that both sides of any steps have handrails. Any raised decks and porches should have guardrails on the edges. Have any leaves, snow, or ice cleared regularly. Use sand or salt on walking paths during winter. Clean up any spills in your garage right away. This includes oil or grease spills. What can I do in the bathroom? Use night lights. Install grab bars by the toilet and in the tub and shower. Do not use towel bars as grab bars. Use non-skid mats or decals in the tub or shower. If you need to sit down in the shower, use a plastic, non-slip stool. Keep the floor dry. Clean up any water that spills on the floor as soon as it happens. Remove soap buildup in the tub or shower regularly. Attach bath mats securely with double-sided non-slip rug tape. Do not have throw rugs and other things on the floor that can make you trip. What can I do in the bedroom? Use night  lights. Make sure that you have a light by your bed that is easy to reach. Do not use any sheets or blankets that are too big for your bed. They should not hang down onto the floor. Have a firm chair that has side arms. You can use this for support while you get dressed. Do not have throw rugs and other things on the floor that can make you trip. What can I do in the kitchen? Clean up any spills right away. Avoid walking on wet floors. Keep items that you use a lot in easy-to-reach places. If you need to reach something above you, use a strong step stool that has a grab bar. Keep electrical cords out of the way. Do not use floor polish or wax that makes floors slippery. If you must use wax, use non-skid floor wax. Do not have throw rugs and other things on the floor that can make you trip. What can I do with my stairs? Do not leave any items on the stairs. Make sure that there are handrails on both sides of the stairs and use them. Fix handrails that are broken or loose. Make sure that handrails are as long as the stairways. Check any carpeting to make sure that it is firmly attached to the stairs. Fix any carpet that is loose or worn. Avoid having throw rugs at the top or bottom of the stairs. If you do have throw rugs, attach them to  the floor with carpet tape. Make sure that you have a light switch at the top of the stairs and the bottom of the stairs. If you do not have them, ask someone to add them for you. What else can I do to help prevent falls? Wear shoes that: Do not have high heels. Have rubber bottoms. Are comfortable and fit you well. Are closed at the toe. Do not wear sandals. If you use a stepladder: Make sure that it is fully opened. Do not climb a closed stepladder. Make sure that both sides of the stepladder are locked into place. Ask someone to hold it for you, if possible. Clearly mark and make sure that you can see: Any grab bars or handrails. First and last  steps. Where the edge of each step is. Use tools that help you move around (mobility aids) if they are needed. These include: Canes. Walkers. Scooters. Crutches. Turn on the lights when you go into a dark area. Replace any light bulbs as soon as they burn out. Set up your furniture so you have a clear path. Avoid moving your furniture around. If any of your floors are uneven, fix them. If there are any pets around you, be aware of where they are. Review your medicines with your doctor. Some medicines can make you feel dizzy. This can increase your chance of falling. Ask your doctor what other things that you can do to help prevent falls. This information is not intended to replace advice given to you by your health care provider. Make sure you discuss any questions you have with your health care provider. Document Released: 06/13/2009 Document Revised: 01/23/2016 Document Reviewed: 09/21/2014 Elsevier Interactive Patient Education  2017 [employer].

## 2021-04-25 NOTE — Progress Notes (Signed)
I connected with Chip Boer today by telephone and verified that I am speaking with the correct person using two identifiers. Location patient: home Location provider: work Persons participating in the virtual visit: Kayci Newlon, Glenna Durand LPN.   I discussed the limitations, risks, security and privacy concerns of performing an evaluation and management service by telephone and the availability of in person appointments. I also discussed with the patient that there may be a patient responsible charge related to this service. The patient expressed understanding and verbally consented to this telephonic visit.    Interactive audio and video telecommunications were attempted between this provider and patient, however failed, due to patient having technical difficulties OR patient did not have access to video capability.  We continued and completed visit with audio only.     Vital signs may be patient reported or missing.  Subjective:   Sarah White is a 75 y.o. female who presents for Medicare Annual (Subsequent) preventive examination.  Review of Systems     Cardiac Risk Factors include: advanced age (>14mn, >>8women);diabetes mellitus;dyslipidemia;hypertension;sedentary lifestyle;smoking/ tobacco exposure     Objective:    Today's Vitals   04/25/21 1426  Weight: 145 lb (65.8 kg)  Height: '5\' 4"'$  (1.626 m)   Body mass index is 24.89 kg/m.  Advanced Directives 04/25/2021 07/01/2020 04/22/2020 04/22/2020 03/08/2020 03/06/2020 01/23/2020  Does Patient Have a Medical Advance Directive? Yes Yes Yes Yes Yes - Yes  Type of Advance Directive [place] Living will Living will - [place]. [place] will;Out of facility DNR (pink MOST or yellow form) Living will  Does patient want to make changes to medical advance directive? - - - - - No - Patient declined No - Patient declined  Copy of [place] Chart? Yes - validated most recent copy scanned in chart (See row information) - - - - - -  Would patient like information on creating a medical advance directive? - - - - - - -    Current Medications (verified) Outpatient Encounter Medications as of 04/25/2021  Medication Sig   albuterol (ACCUNEB) 0.63 MG/3ML nebulizer solution Take 3 mLs (0.63 mg total) by nebulization every 6 (six) hours as needed for wheezing.   amiodarone (PACERONE) 200 MG tablet Take 100 mg by mouth daily.    aspirin EC 81 MG tablet Take 81 mg by mouth at bedtime.    budesonide-formoterol (SYMBICORT) 160-4.5 MCG/ACT inhaler Inhale 2 puffs into the lungs 2 (two) times daily.   buPROPion (WELLBUTRIN SR) 150 MG 12 hr tablet Take 1 tablet by mouth twice daily   carvedilol (COREG) 12.5 MG tablet TAKE 2 TABLETS BY MOUTH TWICE DAILY WITH A MEAL (Patient taking differently: TAKE 1 TABLETS BY MOUTH TWICE DAILY WITH A MEAL)   clopidogrel (PLAVIX) 75 MG tablet Take 1 tablet (75 mg total) by mouth daily at 6 (six) AM.   cyclobenzaprine (FLEXERIL) 10 MG tablet TAKE 1 TABLET BY MOUTH THREE TIMES DAILY AS NEEDED FOR  MUSCLE  SPASMS. DO NOT DRIVE ON THIS MEDICATION. WILL MAKE YOU SLEEPY.   diphenhydrAMINE (BENADRYL) 25 MG tablet Take 25 mg by mouth at bedtime as needed for allergies. Dies not take regularly   donepezil (ARICEPT) 5 MG tablet Take 10 mg by mouth daily.   furosemide (LASIX) 40 MG tablet Take 1 tablet by mouth once daily   isosorbide mononitrate (IMDUR) 60 MG 24 hr tablet Take 60 mg by mouth  daily.   nitroGLYCERIN (NITROSTAT) 0.4 MG SL tablet Place 1 tablet (0.4 mg total) under the tongue every 5 (five) minutes as needed for chest pain.   QUEtiapine (SEROQUEL XR) 50 MG TB24 24 hr tablet Take 1 tablet (50 mg total) by mouth at bedtime.   rosuvastatin (CRESTOR) 20 MG tablet Take 1 tablet by mouth once daily   sacubitril-valsartan (ENTRESTO) 49-51 MG Take 1 tablet by mouth 2 (two) times daily.    sertraline  (ZOLOFT) 100 MG tablet Take 2 tablets by mouth once daily   silver sulfADIAZINE (SILVADENE) 1 % cream Apply 1 application topically daily. (Patient taking differently: Apply 1 application topically as needed.)   traZODone (DESYREL) 50 MG tablet TAKE 1/2 TO 1 (ONE-HALF TO ONE) TABLET BY MOUTH AT BEDTIME AS NEEDED FOR SLEEP   albuterol (VENTOLIN HFA) 108 (90 Base) MCG/ACT inhaler Inhale 2 puffs into the lungs every 6 (six) hours as needed for wheezing or shortness of breath. (Patient not taking: No sig reported)   Multiple Vitamin (MULTIVITAMIN WITH MINERALS) TABS tablet Take 1 tablet by mouth daily. (Patient not taking: No sig reported)   ondansetron (ZOFRAN-ODT) 4 MG disintegrating tablet Take 1 tablet (4 mg total) by mouth every 8 (eight) hours as needed for nausea or vomiting. (Patient not taking: No sig reported)   potassium chloride SA (KLOR-CON) 20 MEQ tablet Take 1 tablet (20 mEq total) by mouth daily. (Patient not taking: No sig reported)   tiotropium (SPIRIVA HANDIHALER) 18 MCG inhalation capsule PLACE 1 CAPSULE INTO INHALER AND INHALE DAILY (Patient not taking: Reported on 04/25/2021)   triamcinolone ointment (KENALOG) 0.5 % Apply 1 application topically 2 (two) times daily. (Patient not taking: No sig reported)   No facility-administered encounter medications on file as of 04/25/2021.    Allergies (verified) Amlodipine, Atorvastatin, Codeine, Hctz [hydrochlorothiazide], Lisinopril, and Metformin and related   History: Past Medical History:  Diagnosis Date   Acute pancreatitis 09/09/2018   Acute respiratory failure with hypoxia and hypercarbia (HCC) 07/22/2015   Cancer ([place])    uterine   CHF (congestive heart failure) (HCC)    COPD (chronic obstructive pulmonary disease) ([place])    Diabetes mellitus without complication ([place])    Encephalopathy acute 07/22/2015   Hypertension    Pneumonia    November 2016   Pulmonary edema 07/22/2015   Squamous cell cancer of skin of upper arm,  right    Past Surgical History:  Procedure Laterality Date   ABDOMINAL HYSTERECTOMY     CAROTID PTA/STENT INTERVENTION Right 03/06/2020   Procedure: CAROTID PTA/STENT INTERVENTION;  Surgeon: Katha Cabal, MD;  Location: [facility];  Service: Cardiovascular;  Laterality: Right;   CARPAL TUNNEL RELEASE Bilateral    CESAREAN SECTION     CHOLECYSTECTOMY N/A 09/12/2018   Procedure: LAPAROSCOPIC CHOLECYSTECTOMY WITH INTRAOPERATIVE CHOLANGIOGRAM;  Surgeon: Herbert Pun, MD;  Location: [facility];  Service: General;  Laterality: N/A;   CORONARY ANGIOPLASTY WITH STENT PLACEMENT     KNEE SURGERY Left    LEFT HEART CATH AND CORONARY ANGIOGRAPHY Right 11/26/2016   Procedure: Left Heart Cath and Coronary Angiography;  Surgeon: Isaias Cowman, MD;  Location: [facility];  Service: Cardiovascular;  Laterality: Right;   LEFT HEART CATH AND CORONARY ANGIOGRAPHY N/A 01/23/2020   Procedure: LEFT HEART CATH AND CORONARY ANGIOGRAPHY;  Surgeon: Yolonda Kida, MD;  Location: [facility];  Service: Cardiovascular;  Laterality: N/A;   Family History  Problem Relation Age of Onset   Heart  disease Mother    Social History   Socioeconomic History   Marital status: Divorced    Spouse name: Not on file   Number of children: Not on file   Years of education: Not on file   Highest education level: Some college, no degree  Occupational History   Occupation: retired  Tobacco Use   Smoking status: Every Day    Packs/day: 1.00    Types: Cigarettes   Smokeless tobacco: Never  Vaping Use   Vaping Use: Never used  Substance and Sexual Activity   Alcohol use: No   Drug use: No   Sexual activity: Not Currently  Other Topics Concern   Not on file  Social History Narrative   Not on file   Social Determinants of Health   Financial Resource Strain: Low Risk    Difficulty of Paying Living Expenses: Not hard at all  Food Insecurity: No Food Insecurity   Worried  About Charity fundraiser in the Last Year: Never true   [place] in the Last Year: Never true  Transportation Needs: No Transportation Needs   Lack of Transportation (Medical): No   Lack of Transportation (Non-Medical): No  Physical Activity: Inactive   Days of Exercise per Week: 0 days   Minutes of Exercise per Session: 0 min  Stress: No Stress Concern Present   Feeling of Stress : Not at all  Social Connections: Not on file    Tobacco Counseling Ready to quit: No Counseling given: Not Answered   Clinical Intake:  Pre-visit preparation completed: Yes  Pain : No/denies pain     Nutritional Status: BMI of 19-24  Normal Nutritional Risks: None Diabetes: Yes  How often do you need to have someone help you when you read instructions, pamphlets, or other written materials from your doctor or pharmacy?: 1 - Never What is the last grade level you completed in school?: some college  Diabetic? Yes Nutrition Risk Assessment:  Has the patient had any N/V/D within the last 2 months?  No  Does the patient have any non-healing wounds?  No  Has the patient had any unintentional weight loss or weight gain?  No   Diabetes:  Is the patient diabetic?  Yes  If diabetic, was a CBG obtained today?  No  Did the patient bring in their glucometer from home?  No  How often do you monitor your CBG's? daily.   Financial Strains and Diabetes Management:  Are you having any financial strains with the device, your supplies or your medication? No .  Does the patient want to be seen by Chronic Care Management for management of their diabetes?  No  Would the patient like to be referred to a Nutritionist or for Diabetic Management?  No   Diabetic Exams:  Diabetic Eye Exam: Overdue for diabetic eye exam. Pt has been advised about the importance in completing this exam. Patient advised to call and schedule an eye exam. Diabetic Foot Exam: Overdue, Pt has been advised about the importance  in completing this exam. Pt is scheduled for diabetic foot exam on next appointment.   Interpreter Needed?: No  Information entered by :: NAllen LPN   Activities of Daily Living In your present state of health, do you have any difficulty performing the following activities: 04/25/2021  Hearing? N  Vision? Y  Comment only when BP gets low  Difficulty concentrating or making decisions? Y  Comment remembering, son makes the decisions  Walking or  climbing stairs? Y  Dressing or bathing? N  Doing errands, shopping? N  Preparing Food and eating ? N  Using the Toilet? N  In the past six months, have you accidently leaked urine? Y  Comment wears depends  Do you have problems with loss of bowel control? Y  Managing your Medications? N  Managing your Finances? N  Housekeeping or managing your Housekeeping? N  Some recent data might be hidden    [name], [name] Yolonda Kida, MD as Consulting Physician (Cardiology) Yolonda Kida, MD as Consulting Physician (Cardiology) Vanita Ingles, RN as Case Manager ([place]) Vladimir Faster, [facility] (Pharmacist)  Indicate any recent Medical Services you may have received from other than [facility] providers in the past year (date may be approximate).     Assessment:   This is a routine wellness examination for Sarah White.  Hearing/Vision screen No results found.  Dietary issues and exercise activities discussed: Current Exercise Habits: The patient does not participate in regular exercise at present   Goals Addressed             This Visit's Progress    Patient Stated       04/25/2021, stay alive       Depression Screen PHQ 2/9 Scores 04/25/2021 09/20/2020 05/17/2020 04/22/2020 04/21/2020 01/19/2020 12/11/2019  PHQ - 2 Score 0 0 '2 5 2 '$ 0 0  PHQ- 9 Score - '9 9 5 7 '$ 0 -    Fall Risk Fall Risk  04/25/2021 06/10/2020 05/17/2020 04/22/2020 04/21/2020  Falls in the past year? '1 1  1 1 1  '$ Comment lost balance - - got dizzy -  Number falls in past yr: '1 1 1 '$ 0 1  Injury with Fall? 0 1 0 0 0  Risk for fall due to : Impaired balance/gait;Impaired mobility;Medication side effect;History of fall(s) History of fall(s) History of fall(s) Impaired balance/gait;Medication side effect;History of fall(s) History of fall(s)  Follow up Falls evaluation completed;Education provided;Falls prevention discussed Falls evaluation completed Falls evaluation completed Falls evaluation completed;Education provided;Falls prevention discussed -    FALL RISK PREVENTION PERTAINING TO THE HOME:  Any stairs in or around the home? Yes  If so, are there any without handrails? No  Home free of loose throw rugs in walkways, pet beds, electrical cords, etc? Yes  Adequate lighting in your home to reduce risk of falls? Yes   ASSISTIVE DEVICES UTILIZED TO PREVENT FALLS:  Life alert? No  Use of a cane, walker or w/c? Yes  Grab bars in the bathroom? Yes  Shower chair or bench in shower? Yes  Elevated toilet seat or a handicapped toilet? Yes   TIMED UP AND GO:  Was the test performed? No .       Cognitive Function:     6CIT Screen 04/25/2021 04/22/2020 02/15/2019 09/30/2017  What Year? 0 points 0 points 0 points 0 points  What month? 0 points 0 points 0 points 0 points  What time? 0 points 3 points 0 points 0 points  Count back from 20 0 points 0 points 0 points 0 points  Months in reverse 0 points 4 points 0 points 0 points  Repeat phrase 2 points 4 points 2 points 0 points  Total Score '2 11 2 '$ 0    Immunizations Immunization History  Administered Date(s) Administered   Fluad Quad(high Dose 65+) 04/28/2019, 05/24/2020   Influenza, High Dose Seasonal PF 05/18/2016, 05/25/2017, 05/05/2018  Influenza, Seasonal, Injecte, Preservative Fre 06/05/2015   PFIZER(Purple Top)SARS-COV-2 Vaccination 10/16/2019, 11/06/2019, 09/24/2020   Pneumococcal Conjugate-13 07/29/2015   Pneumococcal  Polysaccharide-23 09/15/2016   Tdap 09/24/2015   Zoster, Live 02/07/2015    TDAP status: Up to date  Flu Vaccine status: Due, Education has been provided regarding the importance of this vaccine. Advised may receive this vaccine at local pharmacy or Health Dept. Aware to provide a copy of the vaccination record if obtained from local pharmacy or Health Dept. Verbalized acceptance and understanding.  Pneumococcal vaccine status: Up to date  Covid-19 vaccine status: Completed vaccines  Qualifies for Shingles Vaccine? Yes   Zostavax completed Yes   Shingrix Completed?: No.    Education has been provided regarding the importance of this vaccine. Patient has been advised to call insurance company to determine out of pocket expense if they have not yet received this vaccine. Advised may also receive vaccine at local pharmacy or Health Dept. Verbalized acceptance and understanding.  Screening Tests Health Maintenance  Topic Date Due   Zoster Vaccines- Shingrix (1 of 2) Never done   DEXA SCAN  Never done   OPHTHALMOLOGY EXAM  09/16/2016   COVID-19 Vaccine (4 - Booster for Pfizer series) 12/23/2020   FOOT EXAM  01/17/2021   HEMOGLOBIN A1C  02/05/2021   INFLUENZA VACCINE  03/31/2021   MAMMOGRAM  05/24/2021 (Originally 07/05/1998)   COLONOSCOPY (Pts 45-74yr Insurance coverage will need to be confirmed)  05/24/2021 (Originally 07/05/1993)   TETANUS/TDAP  09/23/2025   Hepatitis C Screening  Completed   PNA vac Low Risk Adult  Completed   HPV VACCINES  Aged Out    Health Maintenance  Health Maintenance Due  Topic Date Due   Zoster Vaccines- Shingrix (1 of 2) Never done   DEXA SCAN  Never done   OPHTHALMOLOGY EXAM  09/16/2016   COVID-19 Vaccine (4 - Booster for Pfizer series) 12/23/2020   FOOT EXAM  01/17/2021   HEMOGLOBIN A1C  02/05/2021   INFLUENZA VACCINE  03/31/2021    Colorectal cancer screening: decline  Mammogram status: decline  Bone Density status: decline  Lung Cancer  Screening: (Low Dose CT Chest recommended if Age 54-80 years, 30 pack-year currently smoking OR have quit w/in 15years.) does qualify.   Lung Cancer Screening Referral: declines  Additional Screening:  Hepatitis C Screening: does qualify; Completed 08/12/2015  Vision Screening: Recommended annual ophthalmology exams for early detection of glaucoma and other disorders of the eye. Is the patient up to date with their annual eye exam?  No  Who is the provider or what is the name of the office in which the patient attends annual eye exams? none If pt is not established with a provider, would they like to be referred to a provider to establish care? No .   Dental Screening: Recommended annual dental exams for proper oral hygiene  Community Resource Referral / Chronic Care Management: CRR required this visit?  No   CCM required this visit?  No      Plan:     I have personally reviewed and noted the following in the patient's chart:   Medical and social history Use of alcohol, tobacco or illicit drugs  Current medications and supplements including opioid prescriptions.  Functional ability and status Nutritional status Physical activity Advanced directives List of other physicians Hospitalizations, surgeries, and ER visits in previous 12 months Vitals Screenings to include cognitive, depression, and falls Referrals and appointments  In addition, I have reviewed and discussed with  patient certain preventive protocols, quality metrics, and best practice recommendations. A written personalized care plan for preventive services as well as general preventive health recommendations were provided to patient.     Kellie Simmering, LPN   [id number]   Nurse Notes:

## 2021-05-12 ENCOUNTER — Telehealth: Payer: Self-pay | Admitting: Family Medicine

## 2021-05-13 NOTE — Telephone Encounter (Signed)
Requested medications are due for refill today.  yes  Requested medications are on the active medications list.  yes  Last refill. Lasix 12/01/2020, nitro 12/18/2019  Future visit scheduled.   With nurse - yes  Notes to clinic.  Pt has not been seen for heart issues in over 1 year.

## 2021-05-13 NOTE — Telephone Encounter (Signed)
Needs appointment for future refills. 30 day courtesy sent

## 2021-05-15 NOTE — Telephone Encounter (Signed)
Scheduled phone visit due to pt saying she can't get to the office before the courtesy refill runs out.

## 2021-06-11 ENCOUNTER — Other Ambulatory Visit: Payer: Self-pay | Admitting: Family Medicine

## 2021-06-12 NOTE — Telephone Encounter (Signed)
Requested Prescriptions  Pending Prescriptions Disp Refills  . furosemide (LASIX) 40 MG tablet [Pharmacy Med Name: Furosemide 40 MG Oral Tablet] 90 tablet 0    Sig: Take 1 tablet by mouth once daily     Cardiovascular:  Diuretics - Loop Failed - 06/11/2021  5:30 PM      Failed - Na in normal range and within 360 days    Sodium  Date Value Ref Range Status  09/24/2020 146 (H) 134 - 144 mmol/L Final  01/15/2014 134 (L) 136 - 145 mmol/L Final         Passed - K in normal range and within 360 days    Potassium  Date Value Ref Range Status  09/24/2020 4.0 3.5 - 5.2 mmol/L Final  01/15/2014 4.2 3.5 - 5.1 mmol/L Final         Passed - Ca in normal range and within 360 days    Calcium  Date Value Ref Range Status  09/24/2020 9.2 8.7 - 10.3 mg/dL Final   Calcium, Total  Date Value Ref Range Status  01/15/2014 9.2 8.5 - 10.1 mg/dL Final         Passed - Cr in normal range and within 360 days    Creatinine  Date Value Ref Range Status  01/15/2014 0.52 (L) 0.60 - 1.30 mg/dL Final   Creatinine, Ser  Date Value Ref Range Status  09/24/2020 0.82 0.57 - 1.00 mg/dL Final         Passed - Last BP in normal range    BP Readings from Last 1 Encounters:  04/25/21 139/80         Passed - Valid encounter within last 6 months    Recent Outpatient Visits          8 months ago Diarrhea of presumed infectious origin   [place], Calvin P, DO   8 months ago Diarrhea of presumed infectious origin   [facility], [name], DO   9 months ago Acute cystitis without hematuria   [employer], [name], DO   10 months ago Type 2 diabetes mellitus with hyperglycemia, without long-term current use of insulin ([place])   [place], [name], DO   11 months ago [place], [name], DO      Future Appointments            In 10 months [place], [facility]             . traZODone ([place]) 50 MG tablet [Pharmacy Med Name: traZODone HCl 50 MG Oral Tablet] 90 tablet 0    Sig: TAKE 1/2 TO 1 (ONE-HALF TO ONE) TABLET BY MOUTH AT BEDTIME AS NEEDED FOR SLEEP     Psychiatry: Antidepressants - Serotonin Modulator Passed - 06/11/2021  5:30 PM      Passed - Completed PHQ-2 or PHQ-9 in the last 360 days      Passed - Valid encounter within last 6 months    Recent Outpatient Visits          8 months ago Diarrhea of presumed infectious origin   [facility], [name], DO   8 months ago Diarrhea of presumed infectious origin   [employer], [name], DO   9 months ago Acute cystitis without hematuria   [place], [name], DO   10 months ago Type 2 diabetes mellitus with hyperglycemia,  without long-term current use of insulin ([place])   [place], [place], DO   11 months ago [place], [place], DO      Future Appointments            In 10 months [facility] [employer], [facility]

## 2021-06-23 ENCOUNTER — Ambulatory Visit (INDEPENDENT_AMBULATORY_CARE_PROVIDER_SITE_OTHER): Payer: Medicare Other | Admitting: Family Medicine

## 2021-06-23 ENCOUNTER — Other Ambulatory Visit: Payer: Self-pay

## 2021-06-23 ENCOUNTER — Encounter: Payer: Self-pay | Admitting: Family Medicine

## 2021-06-23 VITALS — BP 114/77 | HR 70 | Temp 98.5°F | Ht 63.8 in | Wt 152.2 lb

## 2021-06-23 DIAGNOSIS — I10 Essential (primary) hypertension: Secondary | ICD-10-CM

## 2021-06-23 DIAGNOSIS — Z23 Encounter for immunization: Secondary | ICD-10-CM | POA: Diagnosis not present

## 2021-06-23 DIAGNOSIS — E782 Mixed hyperlipidemia: Secondary | ICD-10-CM

## 2021-06-23 DIAGNOSIS — E1165 Type 2 diabetes mellitus with hyperglycemia: Secondary | ICD-10-CM | POA: Diagnosis not present

## 2021-06-23 DIAGNOSIS — F321 Major depressive disorder, single episode, moderate: Secondary | ICD-10-CM

## 2021-06-23 DIAGNOSIS — I2510 Atherosclerotic heart disease of native coronary artery without angina pectoris: Secondary | ICD-10-CM

## 2021-06-23 DIAGNOSIS — R079 Chest pain, unspecified: Secondary | ICD-10-CM | POA: Diagnosis not present

## 2021-06-23 DIAGNOSIS — K529 Noninfective gastroenteritis and colitis, unspecified: Secondary | ICD-10-CM

## 2021-06-23 DIAGNOSIS — R110 Nausea: Secondary | ICD-10-CM

## 2021-06-23 DIAGNOSIS — I129 Hypertensive chronic kidney disease with stage 1 through stage 4 chronic kidney disease, or unspecified chronic kidney disease: Secondary | ICD-10-CM | POA: Diagnosis not present

## 2021-06-23 LAB — URINALYSIS, ROUTINE W REFLEX MICROSCOPIC
Bilirubin, UA: NEGATIVE
Glucose, UA: NEGATIVE
Ketones, UA: NEGATIVE
Nitrite, UA: NEGATIVE
Specific Gravity, UA: >1.03 — ABNORMAL HIGH (ref 1.005–1.030)
Urobilinogen, Ur: 0.2 mg/dL (ref 0.2–1.0)
pH, UA: 5.5 (ref 5.0–7.5)

## 2021-06-23 LAB — MICROSCOPIC EXAMINATION

## 2021-06-23 LAB — MICROALBUMIN, URINE WAIVED
Creatinine, Urine Waived: 300 mg/dL (ref 10–300)
Microalb, Ur Waived: 80 mg/L — ABNORMAL HIGH (ref 0–19)

## 2021-06-23 LAB — BAYER DCA HB A1C WAIVED: HB A1C (BAYER DCA - WAIVED): 5.1 % (ref 4.8–5.6)

## 2021-06-23 MED ORDER — SPIRIVA HANDIHALER 18 MCG IN CAPS: ORAL_CAPSULE | RESPIRATORY_TRACT | 1 refills | Status: DC

## 2021-06-23 MED ORDER — ROSUVASTATIN CALCIUM 20 MG PO TABS: 20.0000 mg | ORAL_TABLET | Freq: Every day | ORAL | 1 refills | Status: DC

## 2021-06-23 MED ORDER — POTASSIUM CHLORIDE CRYS ER 20 MEQ PO TBCR: 20.0000 meq | EXTENDED_RELEASE_TABLET | Freq: Every day | ORAL | 1 refills | Status: DC

## 2021-06-23 MED ORDER — CLOPIDOGREL BISULFATE 75 MG PO TABS: 75.0000 mg | ORAL_TABLET | Freq: Every day | ORAL | 1 refills | Status: DC

## 2021-06-23 MED ORDER — QUETIAPINE FUMARATE ER 50 MG PO TB24: 100.0000 mg | ORAL_TABLET | Freq: Every day | ORAL | 1 refills | Status: DC

## 2021-06-23 MED ORDER — SERTRALINE HCL 100 MG PO TABS: 200.0000 mg | ORAL_TABLET | Freq: Every day | ORAL | 1 refills | Status: DC

## 2021-06-23 MED ORDER — BUDESONIDE-FORMOTEROL FUMARATE 160-4.5 MCG/ACT IN AERO: 2.0000 | INHALATION_SPRAY | Freq: Two times a day (BID) | RESPIRATORY_TRACT | 3 refills | Status: DC

## 2021-06-23 MED ORDER — BUPROPION HCL ER (SR) 150 MG PO TB12: 150.0000 mg | ORAL_TABLET | Freq: Two times a day (BID) | ORAL | 1 refills | Status: DC

## 2021-06-23 MED ORDER — TRAZODONE HCL 50 MG PO TABS: 50.0000 mg | ORAL_TABLET | Freq: Every evening | ORAL | 1 refills | Status: DC | PRN

## 2021-06-23 NOTE — Patient Instructions (Addendum)
Dr. Clayborn Bigness 06/25/21 10:15AM [address], [place], [place] [address] Phone: [phone]

## 2021-06-23 NOTE — Progress Notes (Signed)
BP 114/77   Pulse 70   Temp 98.5 F (36.9 C) (Oral)   Ht 5' 3.8" (1.621 m)   Wt 152 lb 3.2 oz (69 kg)   SpO2 96%   BMI 26.29 kg/m    Subjective:    [name], female    DOB: 1946/08/23, 75 y.o.   MRN: [id number]  HPI: Sarah White is a 75 y.o. female  No chief complaint on file.  DIABETES Hypoglycemic episodes:no Polydipsia/polyuria: no Visual disturbance: no Chest pain: no Paresthesias: no Glucose Monitoring: no  Accucheck frequency: Not Checking Taking Insulin?: no Blood Pressure Monitoring: not checking Retinal Examination: Not up to Date Foot Exam: Not up to Date Diabetic Education: Completed Pneumovax: Up to Date Influenza: Up to Date Aspirin: yes  HYPERTENSION / HYPERLIPIDEMIA Satisfied with current treatment? yes Duration of hypertension: chronic BP monitoring frequency: not checking BP medication side effects: no Past BP meds: carvedilol, imdur, entresto Duration of hyperlipidemia: chronic Cholesterol medication side effects: no Cholesterol supplements: none Past cholesterol medications: crestor Medication compliance: excellent compliance Aspirin: yes Recent stressors: yes Recurrent headaches: no Visual changes: no Palpitations: no Dyspnea: no Chest pain: no Lower extremity edema: yes Dizzy/lightheaded: yes  DEPRESSION Mood status: exacerbated Satisfied with current treatment?: no Symptom severity: moderate  Duration of current treatment : chronic Side effects: no Medication compliance: excellent compliance Psychotherapy/counseling: no  Previous psychiatric medications: sertraline, seroquel Depressed mood: yes Anxious mood: yes Anhedonia: no Significant weight loss or gain: no Insomnia: no  Fatigue: yes Feelings of worthlessness or guilt: no Impaired concentration/indecisiveness: no Suicidal ideations: no Hopelessness: no Crying spells: no Depression screen [facility] 2/9 06/23/2021 04/25/2021 09/20/2020 05/17/2020  04/22/2020  Decreased Interest 3 0 0 1 3  Down, Depressed, Hopeless 3 0 0 1 2  PHQ - 2 Score 6 0 0 2 5  Altered sleeping 3 - 3 2 0  Tired, decreased energy 3 - 0 - 0  Change in appetite 1 - 3 3 0  Feeling bad or failure about yourself  3 - 3 0 0  Trouble concentrating 1 - 0 2 0  Moving slowly or fidgety/restless 0 - 0 0 0  Suicidal thoughts 0 - 0 0 0  PHQ-9 Score 17 - 9 9 5   Difficult doing work/chores Somewhat difficult - Not difficult at all Not difficult at all Not difficult at all  Some recent data might be hidden   CHEST PAIN Duration:weeks Onset: gradual Quality: pressure Severity: moderate Location: substernal Radiation: none Episode duration: constant  Frequency: constant Related to exertion: yes Trauma: no Anxiety/recent stressors: yes Status: worse Treatments attempted: nothing  Current pain status: in pain Shortness of breath: yes Cough: no Nausea: yes Diaphoresis: yes Heartburn: no Palpitations: no   Relevant past medical, surgical, family and social history reviewed and updated as indicated. Interim medical history since our last visit reviewed. Allergies and medications reviewed and updated.  Review of Systems  Constitutional: Negative.   Respiratory: Negative.    Cardiovascular: Negative.   Gastrointestinal:  Positive for abdominal pain and diarrhea. Negative for abdominal distention, anal bleeding, blood in stool, constipation, nausea, rectal pain and vomiting.  Musculoskeletal: Negative.   Neurological: Negative.   Psychiatric/Behavioral:  Positive for dysphoric mood. Negative for agitation, behavioral problems, confusion, decreased concentration, hallucinations, self-injury, sleep disturbance and suicidal ideas. The patient is nervous/anxious. The patient is not hyperactive.    Per HPI unless specifically indicated above     Objective:    BP 114/77  Pulse 70   Temp 98.5 F (36.9 C) (Oral)   Ht 5' 3.8" (1.621 m)   Wt 152 lb 3.2 oz (69 kg)    SpO2 96%   BMI 26.29 kg/m   Wt Readings from Last 3 Encounters:  06/23/21 152 lb 3.2 oz (69 kg)  04/25/21 145 lb (65.8 kg)  10/03/20 159 lb (72.1 kg)    Physical Exam Vitals and nursing note reviewed.  Constitutional:      General: She is not in acute distress.    Appearance: Normal appearance. She is not ill-appearing, toxic-appearing or diaphoretic.  HENT:     Head: Normocephalic and atraumatic.     Right Ear: External ear normal.     Left Ear: External ear normal.     Nose: Nose normal.     Mouth/Throat:     Mouth: Mucous membranes are moist.     Pharynx: Oropharynx is clear.  Eyes:     General: No scleral icterus.       Right eye: No discharge.        Left eye: No discharge.     Extraocular Movements: Extraocular movements intact.     Conjunctiva/sclera: Conjunctivae normal.     Pupils: Pupils are equal, round, and reactive to light.  Cardiovascular:     Rate and Rhythm: Normal rate and regular rhythm.     Pulses: Normal pulses.     Heart sounds: Normal heart sounds. No murmur heard.   No friction rub. No gallop.  Pulmonary:     Effort: Pulmonary effort is normal. No respiratory distress.     Breath sounds: Normal breath sounds. No stridor. No wheezing, rhonchi or rales.  Chest:     Chest wall: No tenderness.  Musculoskeletal:        General: Normal range of motion.     Cervical back: Normal range of motion and neck supple.  Skin:    General: Skin is warm and dry.     Capillary Refill: Capillary refill takes less than 2 seconds.     Coloration: Skin is not jaundiced or pale.     Findings: No bruising, erythema, lesion or rash.  Neurological:     General: No focal deficit present.     Mental Status: She is alert and oriented to person, place, and time. Mental status is at baseline.  Psychiatric:        Mood and Affect: Mood normal.        Behavior: Behavior normal.        Thought Content: Thought content normal.        Judgment: Judgment normal.    Results  for orders placed or performed in visit on 06/23/21  Microscopic Examination   Urine  Result Value Ref Range   WBC, UA 0-5 0 - 5 /hpf   RBC 0-2 0 - 2 /hpf   Epithelial Cells (non renal) 0-10 0 - 10 /hpf   Bacteria, UA Many (A) None seen/Few  CBC with Differential/Platelet  Result Value Ref Range   WBC 6.8 3.4 - 10.8 x10E3/uL   RBC 4.82 3.77 - 5.28 x10E6/uL   Hemoglobin 14.0 11.1 - 15.9 g/dL   Hematocrit 41.9 34.0 - 46.6 %   MCV 87 79 - 97 fL   MCH 29.0 26.6 - 33.0 pg   MCHC 33.4 31.5 - 35.7 g/dL   RDW 14.3 11.7 - 15.4 %   Platelets 146 (L) 150 - 450 x10E3/uL   Neutrophils 79 Not Estab. %  Lymphs 13 Not Estab. %   Monocytes 7 Not Estab. %   Eos 1 Not Estab. %   Basos 0 Not Estab. %   Neutrophils Absolute 5.3 1.4 - 7.0 x10E3/uL   Lymphocytes Absolute 0.9 0.7 - 3.1 x10E3/uL   Monocytes Absolute 0.5 0.1 - 0.9 x10E3/uL   EOS (ABSOLUTE) 0.1 0.0 - 0.4 x10E3/uL   Basophils Absolute 0.0 0.0 - 0.2 x10E3/uL   Immature Granulocytes 0 Not Estab. %   Immature Grans (Abs) 0.0 0.0 - 0.1 x10E3/uL  Bayer DCA Hb A1c Waived  Result Value Ref Range   HB A1C (BAYER DCA - WAIVED) 5.1 4.8 - 5.6 %  Comprehensive metabolic panel  Result Value Ref Range   Glucose 89 70 - 99 mg/dL   BUN 14 8 - 27 mg/dL   Creatinine, Ser 0.99 0.57 - 1.00 mg/dL   eGFR 61 >59 mL/min/1.73   BUN/Creatinine Ratio 14 12 - 28   Sodium 140 134 - 144 mmol/L   Potassium 3.8 3.5 - 5.2 mmol/L   Chloride 99 96 - 106 mmol/L   CO2 24 20 - 29 mmol/L   Calcium 9.3 8.7 - 10.3 mg/dL   Total Protein 6.1 6.0 - 8.5 g/dL   Albumin 4.2 3.7 - 4.7 g/dL   Globulin, Total 1.9 1.5 - 4.5 g/dL   Albumin/Globulin Ratio 2.2 1.2 - 2.2   Bilirubin Total 0.2 0.0 - 1.2 mg/dL   Alkaline Phosphatase 98 44 - 121 IU/L   AST 18 0 - 40 IU/L   ALT 18 0 - 32 IU/L  Lipid Panel w/o Chol/HDL Ratio  Result Value Ref Range   Cholesterol, Total 190 100 - 199 mg/dL   Triglycerides 243 (H) 0 - 149 mg/dL   HDL 54 >39 mg/dL   VLDL Cholesterol Cal 41 (H) 5  - 40 mg/dL   LDL Chol Calc (NIH) 95 0 - 99 mg/dL  Microalbumin, Urine Waived  Result Value Ref Range   Microalb, Ur Waived 80 (H) 0 - 19 mg/L   Creatinine, Urine Waived 300 10 - 300 mg/dL   Microalb/Creat Ratio 30-300 (H) <30 mg/g  TSH  Result Value Ref Range   TSH 2.940 0.450 - 4.500 uIU/mL  Urinalysis, Routine w reflex microscopic  Result Value Ref Range   Specific Gravity, UA >1.030 (H) 1.005 - 1.030   pH, UA 5.5 5.0 - 7.5   Color, UA Yellow Yellow   Appearance Ur Cloudy (A) Clear   Leukocytes,UA Trace (A) Negative   Protein,UA Trace (A) Negative/Trace   Glucose, UA Negative Negative   Ketones, UA Negative Negative   RBC, UA Trace (A) Negative   Bilirubin, UA Negative Negative   Urobilinogen, Ur 0.2 0.2 - 1.0 mg/dL   Nitrite, UA Negative Negative   Microscopic Examination See below:       Assessment & Plan:   Problem List Items Addressed This Visit       Cardiovascular and Mediastinum   Essential (primary) hypertension - Primary    Under good control on current regimen. Continue current regimen. Continue to monitor. Call with any concerns. Refills given. Labs drawn today.        Relevant Medications   amiodarone (PACERONE) 200 MG tablet   carvedilol (COREG) 12.5 MG tablet   rosuvastatin (CRESTOR) 20 MG tablet   Other Relevant Orders   CBC with Differential/Platelet (Completed)   Comprehensive metabolic panel (Completed)   Microalbumin, Urine Waived (Completed)   TSH (Completed)   Urinalysis, Routine w reflex  microscopic (Completed)     Endocrine   Type 2 diabetes mellitus with hyperglycemia (HCC)    Doing well with a1c of 5.1. Not currently not on medicine. Will continue to monitor. Call with any concerns.       Relevant Medications   rosuvastatin (CRESTOR) 20 MG tablet   Other Relevant Orders   CBC with Differential/Platelet (Completed)   Bayer DCA Hb A1c Waived (Completed)   Comprehensive metabolic panel (Completed)   Microalbumin, Urine Waived  (Completed)   Urinalysis, Routine w reflex microscopic (Completed)     Genitourinary   Hypertensive renal disease    Under good control on current regimen. Continue current regimen. Continue to monitor. Call with any concerns. Refills given. Labs drawn today.          Other   Hyperlipidemia    Under good control on current regimen. Continue current regimen. Continue to monitor. Call with any concerns. Refills given. Labs drawn today.        Relevant Medications   amiodarone (PACERONE) 200 MG tablet   carvedilol (COREG) 12.5 MG tablet   rosuvastatin (CRESTOR) 20 MG tablet   Other Relevant Orders   CBC with Differential/Platelet (Completed)   Comprehensive metabolic panel (Completed)   Lipid Panel w/o Chol/HDL Ratio (Completed)   Depression, major, single episode, moderate (HCC)    Not under good control. Will increase her seroquel to 145m and recheck 1 month. Call with any concerns.       Relevant Medications   sertraline (ZOLOFT) 100 MG tablet   traZODone (DESYREL) 50 MG tablet   buPROPion (WELLBUTRIN SR) 150 MG 12 hr tablet   Other Relevant Orders   CBC with Differential/Platelet (Completed)   Comprehensive metabolic panel (Completed)   Other Visit Diagnoses     Chronic diarrhea       Not improving. Will refer to GI. Call with any concerns. Await their input.    Relevant Orders   Ambulatory referral to Gastroenterology   Chronic nausea       Not improving. Will refer to GI. Call with any concerns. Await their input.    Relevant Orders   Ambulatory referral to Gastroenterology   Chest pain, unspecified type       Relevant Orders   EKG 12-Lead (Completed)   Need for influenza vaccination       Relevant Orders   Flu Vaccine QUAD High Dose(Fluad) (Completed)        Follow up plan: Return in about 4 weeks (around 07/21/2021).

## 2021-06-24 LAB — CBC WITH DIFFERENTIAL/PLATELET
Basophils Absolute: 0 10*3/uL (ref 0.0–0.2)
Basos: 0 %
EOS (ABSOLUTE): 0.1 10*3/uL (ref 0.0–0.4)
Eos: 1 %
Hematocrit: 41.9 % (ref 34.0–46.6)
Hemoglobin: 14 g/dL (ref 11.1–15.9)
Immature Grans (Abs): 0 10*3/uL (ref 0.0–0.1)
Immature Granulocytes: 0 %
Lymphocytes Absolute: 0.9 10*3/uL (ref 0.7–3.1)
Lymphs: 13 %
MCH: 29 pg (ref 26.6–33.0)
MCHC: 33.4 g/dL (ref 31.5–35.7)
MCV: 87 fL (ref 79–97)
Monocytes Absolute: 0.5 10*3/uL (ref 0.1–0.9)
Monocytes: 7 %
Neutrophils Absolute: 5.3 10*3/uL (ref 1.4–7.0)
Neutrophils: 79 %
Platelets: 146 10*3/uL — ABNORMAL LOW (ref 150–450)
RBC: 4.82 x10E6/uL (ref 3.77–5.28)
RDW: 14.3 % (ref 11.7–15.4)
WBC: 6.8 10*3/uL (ref 3.4–10.8)

## 2021-06-24 LAB — COMPREHENSIVE METABOLIC PANEL
ALT: 18 IU/L (ref 0–32)
AST: 18 IU/L (ref 0–40)
Albumin/Globulin Ratio: 2.2 (ref 1.2–2.2)
Albumin: 4.2 g/dL (ref 3.7–4.7)
Alkaline Phosphatase: 98 IU/L (ref 44–121)
BUN/Creatinine Ratio: 14 (ref 12–28)
BUN: 14 mg/dL (ref 8–27)
Bilirubin Total: 0.2 mg/dL (ref 0.0–1.2)
CO2: 24 mmol/L (ref 20–29)
Calcium: 9.3 mg/dL (ref 8.7–10.3)
Chloride: 99 mmol/L (ref 96–106)
Creatinine, Ser: 0.99 mg/dL (ref 0.57–1.00)
Globulin, Total: 1.9 g/dL (ref 1.5–4.5)
Glucose: 89 mg/dL (ref 70–99)
Potassium: 3.8 mmol/L (ref 3.5–5.2)
Sodium: 140 mmol/L (ref 134–144)
Total Protein: 6.1 g/dL (ref 6.0–8.5)
eGFR: 61 mL/min/{1.73_m2} (ref >59)

## 2021-06-24 LAB — LIPID PANEL W/O CHOL/HDL RATIO
Cholesterol, Total: 190 mg/dL (ref 100–199)
HDL: 54 mg/dL (ref >39)
LDL Chol Calc (NIH): 95 mg/dL (ref 0–99)
Triglycerides: 243 mg/dL — ABNORMAL HIGH (ref 0–149)
VLDL Cholesterol Cal: 41 mg/dL — ABNORMAL HIGH (ref 5–40)

## 2021-06-24 LAB — TSH: TSH: 2.94 u[IU]/mL (ref 0.450–4.500)

## 2021-06-25 ENCOUNTER — Telehealth: Payer: Self-pay

## 2021-06-25 NOTE — Chronic Care Management (AMB) (Signed)
    Chronic Care Management Pharmacy Assistant   Name: KAISHA WACHOB  MRN: [id number] DOB: May 04, 1946  Reason for Encounter: Patient Assistance application renewal for 9233 on Trulicity.    Medications: Outpatient Encounter Medications as of 06/25/2021  Medication Sig Note   albuterol (ACCUNEB) 0.63 MG/3ML nebulizer solution Take 3 mLs (0.63 mg total) by nebulization every 6 (six) hours as needed for wheezing.    albuterol (VENTOLIN HFA) 108 (90 Base) MCG/ACT inhaler Inhale 2 puffs into the lungs every 6 (six) hours as needed for wheezing or shortness of breath. 2020/04/18: Taking 2 puffs BID   amiodarone (PACERONE) 200 MG tablet Take 0.5 tablets by mouth daily.    aspirin EC 81 MG tablet Take 81 mg by mouth at bedtime.     budesonide-formoterol (SYMBICORT) 160-4.5 MCG/ACT inhaler Inhale 2 puffs into the lungs 2 (two) times daily.    buPROPion (WELLBUTRIN SR) 150 MG 12 hr tablet Take 1 tablet (150 mg total) by mouth 2 (two) times daily.    carvedilol (COREG) 12.5 MG tablet Take 12.5 mg by mouth daily.    clopidogrel (PLAVIX) 75 MG tablet Take 1 tablet (75 mg total) by mouth daily at 6 (six) AM.    diphenhydrAMINE (BENADRYL) 25 MG tablet Take 25 mg by mouth at bedtime as needed for allergies. Dies not take regularly 04/18/20: Taking 1 QPM   donepezil (ARICEPT) 10 MG tablet TAKE 1 TABLET BY MOUTH NIGHTLY FOR 30 DAYS    furosemide (LASIX) 40 MG tablet Take 1 tablet by mouth once daily    isosorbide mononitrate (IMDUR) 60 MG 24 hr tablet Take 60 mg by mouth daily.    nitroGLYCERIN (NITROSTAT) 0.4 MG SL tablet DISSOLVE ONE TABLET UNDER THE TONGUE EVERY 5 MINUTES AS NEEDED FOR CHEST PAIN.  DO NOT EXCEED A TOTAL OF 3 DOSES IN 15 MINUTES    potassium chloride SA (KLOR-CON) 20 MEQ tablet Take 1 tablet (20 mEq total) by mouth daily.    QUEtiapine (SEROQUEL XR) 50 MG TB24 24 hr tablet Take 2 tablets (100 mg total) by mouth at bedtime.    rosuvastatin (CRESTOR) 20 MG tablet Take 1 tablet (20 mg total)  by mouth daily.    sacubitril-valsartan (ENTRESTO) 49-51 MG Take 1 tablet by mouth 2 (two) times daily.     sertraline (ZOLOFT) 100 MG tablet Take 2 tablets (200 mg total) by mouth daily.    silver sulfADIAZINE (SILVADENE) 1 % cream Apply 1 application topically daily. (Patient taking differently: Apply 1 application topically as needed.)    tiotropium (SPIRIVA HANDIHALER) 18 MCG inhalation capsule PLACE 1 CAPSULE INTO INHALER AND INHALE DAILY    traZODone (DESYREL) 50 MG tablet Take 1 tablet (50 mg total) by mouth at bedtime as needed for sleep.    triamcinolone ointment (KENALOG) 0.5 % Apply 1 application topically 2 (two) times daily.    No facility-administered encounter medications on file as of 06/25/2021.   Contacted patient about the patient assistance application renewal for Trulicity. Patient states she is no longer on Trulicity due to her A0T being 5.0 and has been controlled. Patient also states she does have 8 months worth of the medication due to her PCP taking off and on of the Trulicity.  Corrie Mckusick, [place]

## 2021-06-26 NOTE — Assessment & Plan Note (Signed)
Under good control on current regimen. Continue current regimen. Continue to monitor. Call with any concerns. Refills given. Labs drawn today.   

## 2021-06-26 NOTE — Assessment & Plan Note (Signed)
Doing well with a1c of 5.1. Not currently not on medicine. Will continue to monitor. Call with any concerns.

## 2021-06-26 NOTE — Assessment & Plan Note (Signed)
Not under good control. Will increase her seroquel to 100mg  and recheck 1 month. Call with any concerns.

## 2021-06-28 NOTE — Progress Notes (Signed)
Interpreted by me on 06/23/21. NSR at 69bpm, 1st degree AV block

## 2021-06-30 ENCOUNTER — Telehealth: Payer: Medicare Other | Admitting: Family Medicine

## 2021-06-30 ENCOUNTER — Other Ambulatory Visit: Payer: Self-pay

## 2021-07-01 ENCOUNTER — Ambulatory Visit (INDEPENDENT_AMBULATORY_CARE_PROVIDER_SITE_OTHER): Payer: Medicare Other | Admitting: Gastroenterology

## 2021-07-01 ENCOUNTER — Encounter: Payer: Self-pay | Admitting: Gastroenterology

## 2021-07-01 ENCOUNTER — Other Ambulatory Visit: Payer: Self-pay

## 2021-07-01 VITALS — BP 139/63 | HR 75 | Temp 98.6°F | Ht 63.0 in | Wt 152.8 lb

## 2021-07-01 DIAGNOSIS — K529 Noninfective gastroenteritis and colitis, unspecified: Secondary | ICD-10-CM | POA: Diagnosis not present

## 2021-07-01 DIAGNOSIS — R1319 Other dysphagia: Secondary | ICD-10-CM | POA: Diagnosis not present

## 2021-07-01 DIAGNOSIS — R195 Other fecal abnormalities: Secondary | ICD-10-CM

## 2021-07-01 DIAGNOSIS — R12 Heartburn: Secondary | ICD-10-CM | POA: Diagnosis not present

## 2021-07-01 MED ORDER — PEG 3350-KCL-NA BICARB-NACL 420 G PO SOLR: 4000.0000 mL | Freq: Once | ORAL | 0 refills | Status: AC

## 2021-07-01 NOTE — Progress Notes (Signed)
Gave patient sutab samples for prep

## 2021-07-01 NOTE — Progress Notes (Signed)
Cephas Darby, MD [address]  [place]  [place], [place] [address]  Main: [phone]  Fax: [phone]    Gastroenterology Consultation  Referring Provider:     Valerie Roys, DO Primary Care Physician:  Valerie Roys, DO Primary Gastroenterologist:  Dr. Cephas Darby Reason for Consultation:     Chronic diarrhea, FOBT positive        HPI:   Sarah White is a 75 y.o. female referred by Dr. Wynetta Emery, Barb Merino, DO  for consultation & management of chronic diarrhea, FOBT positive.  Patient has history of strong tobacco use, has been experiencing nonbloody diarrhea, predominantly postprandial for more than 6 months.  She reports that within 2 hours after she has a meal, she has diarrheal episodes, about 2-3.  She also reports abdominal bloating, abdominal cramps postprandial.  Her weight has been stable.  She had FOBT positive.  She smokes 1 pack/day since age of 73.  History of gallstone pancreatitis and cholecystectomy in 08/2018  She also complains of intermittent dysphagia to solids, heartburn, not on PPI  Patient has history of severe ischemic cardiomyopathy EF of 35 to 40%, on aggressive medical therapy.  Patient is accompanied by her son today  NSAIDs: None  Antiplts/Anticoagulants/Anti thrombotics: Aspirin and Plavix for history of carotid artery stenosis s/p stent placement to the right ICA  GI Procedures: None  Past Medical History:  Diagnosis Date   Acute pancreatitis 09/09/2018   Acute respiratory failure with hypoxia and hypercarbia (HCC) 07/22/2015   Cancer ([place])    uterine   CHF (congestive heart failure) (HCC)    COPD (chronic obstructive pulmonary disease) ([place])    Diabetes mellitus without complication ([place])    Encephalopathy acute 07/22/2015   Hypertension    Pneumonia    November 2016   Pulmonary edema 07/22/2015   Squamous cell cancer of skin of upper arm, right     Past Surgical History:  Procedure Laterality Date   CAROTID  PTA/STENT INTERVENTION Right 03/06/2020   Procedure: CAROTID PTA/STENT INTERVENTION;  Surgeon: Katha Cabal, MD;  Location: [facility];  Service: Cardiovascular;  Laterality: Right;   CARPAL TUNNEL RELEASE Bilateral    CESAREAN SECTION     CHOLECYSTECTOMY N/A 09/12/2018   Procedure: LAPAROSCOPIC CHOLECYSTECTOMY WITH INTRAOPERATIVE CHOLANGIOGRAM;  Surgeon: Herbert Pun, MD;  Location: [facility];  Service: General;  Laterality: N/A;   CORONARY ANGIOPLASTY WITH STENT PLACEMENT     KNEE SURGERY Left    LEFT HEART CATH AND CORONARY ANGIOGRAPHY Right 11/26/2016   Procedure: Left Heart Cath and Coronary Angiography;  Surgeon: Isaias Cowman, MD;  Location: [facility];  Service: Cardiovascular;  Laterality: Right;   LEFT HEART CATH AND CORONARY ANGIOGRAPHY N/A 01/23/2020   Procedure: LEFT HEART CATH AND CORONARY ANGIOGRAPHY;  Surgeon: Yolonda Kida, MD;  Location: [facility];  Service: Cardiovascular;  Laterality: N/A;   TOTAL ABDOMINAL HYSTERECTOMY     Current Outpatient Medications:    albuterol (ACCUNEB) 0.63 MG/3ML nebulizer solution, Take 3 mLs (0.63 mg total) by nebulization every 6 (six) hours as needed for wheezing., Disp: 75 mL, Rfl: 6   albuterol (VENTOLIN HFA) 108 (90 Base) MCG/ACT inhaler, Inhale 2 puffs into the lungs every 6 (six) hours as needed for wheezing or shortness of breath., Disp: 18 g, Rfl: 6   amiodarone (PACERONE) 200 MG tablet, Take 0.5 tablets by mouth daily., Disp: , Rfl:    aspirin EC 81 MG tablet,  Take 81 mg by mouth at bedtime. , Disp: , Rfl:    budesonide-formoterol (SYMBICORT) 160-4.5 MCG/ACT inhaler, Inhale 2 puffs into the lungs 2 (two) times daily., Disp: 3 each, Rfl: 3   buPROPion (WELLBUTRIN XL) 150 MG 24 hr tablet, Take by mouth., Disp: , Rfl:    carvedilol (COREG) 12.5 MG tablet, Take 12.5 mg by mouth daily., Disp: , Rfl:    clopidogrel (PLAVIX) 75 MG tablet, Take 1 tablet (75 mg total) by mouth daily at 6  (six) AM., Disp: 90 tablet, Rfl: 1   diphenhydrAMINE (BENADRYL) 25 MG tablet, Take 25 mg by mouth at bedtime as needed for allergies. Dies not take regularly, Disp: , Rfl:    donepezil (ARICEPT) 10 MG tablet, TAKE 1 TABLET BY MOUTH NIGHTLY FOR 30 DAYS, Disp: , Rfl:    furosemide (LASIX) 40 MG tablet, Take 1 tablet by mouth once daily, Disp: 90 tablet, Rfl: 0   isosorbide mononitrate (IMDUR) 60 MG 24 hr tablet, Take 60 mg by mouth daily., Disp: , Rfl:    nitroGLYCERIN (NITROSTAT) 0.4 MG SL tablet, DISSOLVE ONE TABLET UNDER THE TONGUE EVERY 5 MINUTES AS NEEDED FOR CHEST PAIN.  DO NOT EXCEED A TOTAL OF 3 DOSES IN 15 MINUTES, Disp: 25 tablet, Rfl: 0   potassium chloride SA (KLOR-CON) 20 MEQ tablet, Take 1 tablet (20 mEq total) by mouth daily., Disp: 90 tablet, Rfl: 1   QUEtiapine (SEROQUEL XR) 50 MG TB24 24 hr tablet, Take 2 tablets (100 mg total) by mouth at bedtime., Disp: 180 tablet, Rfl: 1   rosuvastatin (CRESTOR) 20 MG tablet, Take 1 tablet (20 mg total) by mouth daily., Disp: 90 tablet, Rfl: 1   sacubitril-valsartan (ENTRESTO) 49-51 MG, Take 1 tablet by mouth 2 (two) times daily. , Disp: , Rfl:    sertraline (ZOLOFT) 100 MG tablet, Take 2 tablets (200 mg total) by mouth daily., Disp: 180 tablet, Rfl: 1   silver sulfADIAZINE (SILVADENE) 1 % cream, Apply 1 application topically daily. (Patient taking differently: Apply 1 application topically as needed.), Disp: 50 g, Rfl: 0   tiotropium (SPIRIVA HANDIHALER) 18 MCG inhalation capsule, PLACE 1 CAPSULE INTO INHALER AND INHALE DAILY, Disp: 90 capsule, Rfl: 1   traZODone (DESYREL) 50 MG tablet, Take 1 tablet (50 mg total) by mouth at bedtime as needed for sleep., Disp: 90 tablet, Rfl: 1   triamcinolone ointment (KENALOG) 0.5 %, Apply 1 application topically 2 (two) times daily., Disp: 30 g, Rfl: 3  Family History  Problem Relation Age of Onset   Heart disease Mother      Social History   Tobacco Use   Smoking status: Every Day    Packs/day: 1.00     Types: Cigarettes   Smokeless tobacco: Never  Vaping Use   Vaping Use: Never used  Substance Use Topics   Alcohol use: No   Drug use: No    Allergies as of 07/01/2021 - Review Complete 07/01/2021  Allergen Reaction Noted   Amlodipine Swelling 04/08/2016   Atorvastatin Other (See Comments) 09/20/2019   Codeine Nausea And Vomiting 07/29/2015   Hctz [hydrochlorothiazide] Other (See Comments) 03/12/2016   Lisinopril Other (See Comments) 02/04/2016   Metformin and related Nausea Only 11/04/2015    Review of Systems:    All systems reviewed and negative except where noted in HPI.   Physical Exam:  BP 139/63 (BP Location: Right Arm, Patient Position: Sitting, Cuff Size: Normal)   Pulse 75   Temp 98.6 F (37 C) (Oral)  Ht 5\' 3"  (1.6 m)   Wt 152 lb 12.8 oz (69.3 kg)   BMI 27.07 kg/m  No LMP recorded. Patient has had a hysterectomy.  General:   Alert, moderately built, moderately nourished, pleasant and cooperative in NAD Head:  Normocephalic and atraumatic. Eyes:  Sclera clear, no icterus.   Conjunctiva pink. Ears:  Normal auditory acuity. Nose:  No deformity, discharge, or lesions. Mouth:  No deformity or lesions,oropharynx pink & moist. Neck:  Supple; no masses or thyromegaly. Lungs:  Respirations even and unlabored.  Clear throughout to auscultation.   No wheezes, crackles, or rhonchi. No acute distress. Heart:  Regular rate and rhythm; no murmurs, clicks, rubs, or gallops. Abdomen:  Normal bowel sounds. Soft, non-tender and mildly distended without masses, hepatosplenomegaly or hernias noted.  No guarding or rebound tenderness.   Rectal: Not performed Msk:  Symmetrical without gross deformities.  Generalized weakness, peripheral muscle wasting pulses:  Normal pulses noted. Extremities:  No clubbing or edema.  No cyanosis. Neurologic:  Alert and oriented x3;  grossly normal neurologically. Skin:  Intact without significant lesions or rashes. No jaundice. Psych:  Alert  and cooperative. Normal mood and affect.  Imaging Studies: Reviewed  Assessment and Plan:   LYNELL GREENHOUSE is a 75 y.o. female with history of chronic tobacco use, significant multiple vessel coronary artery disease EF of 30 to 35% on aggressive medical therapy, carotid artery stenosis s/p PCI on DAPT, diabetes, COPD, hypertension is seen in consultation for chronic diarrhea and FOBT positive, dysphagia, chronic heartburn  Dysphagia, chronic heartburn Recommend EGD for further evaluation  Chronic diarrhea with history of chronic tobacco use, concern for EPI secondary to chronic pancreatitis Check pancreatic fecal elastase levels Recommend EGD and colonoscopy with gastric, duodenal biopsies, TI evaluation, random colon biopsies  FOBT positive Colonoscopy as above  Given history of significant coronary artery disease, recommend cardiac clearance as well as interruption of Plavix prior to endoscopic procedures  I have discussed alternative options, risks & benefits,  which include, but are not limited to, bleeding, infection, perforation,respiratory complication & drug reaction.  The patient agrees with this plan & written consent will be obtained.     Follow up in 3 to 4 months   Cephas Darby, MD

## 2021-07-09 ENCOUNTER — Other Ambulatory Visit: Payer: Self-pay | Admitting: Family Medicine

## 2021-07-09 NOTE — Telephone Encounter (Signed)
Requested medication (s) are due for refill today: Yes  Requested medication (s) are on the active medication list : Quetiapine  Last refill:  06/23/21  Future visit scheduled: Yes  Notes to clinic:  Isosorbide d/c 06/28/20.  Quetiapine needs approval.    Requested Prescriptions  Pending Prescriptions Disp Refills   isosorbide mononitrate (IMDUR) 30 MG 24 hr tablet [Pharmacy Med Name: Isosorbide Mononitrate ER 30 MG Oral Tablet Extended Release 24 Hour] 90 tablet 0    Sig: Take 1 tablet by mouth once daily     Cardiovascular:  Nitrates Passed - 07/09/2021  5:30 AM      Passed - Last BP in normal range    BP Readings from Last 1 Encounters:  07/01/21 139/63          Passed - Last Heart Rate in normal range    Pulse Readings from Last 1 Encounters:  07/01/21 75          Passed - Valid encounter within last 12 months    Recent Outpatient Visits           2 weeks ago Essential (primary) hypertension   [employer], [name], DO   9 months ago Diarrhea of presumed infectious origin   [employer], [place] P, DO   9 months ago Diarrhea of presumed infectious origin   [employer], [place] P, DO   10 months ago Acute cystitis without hematuria   [employer], [name], DO   11 months ago Type 2 diabetes mellitus with hyperglycemia, without long-term current use of insulin ([place])   [place], [place], DO       Future Appointments             In 1 week Johnson, [name], DO [facility], [facility]   In 3 months Vanga, Tally Due, MD [place]   In 9 months  [facility]             QUEtiapine (SEROQUEL XR) 50 MG TB24 24 hr tablet [Pharmacy Med Name: QUEtiapine Fumarate ER 50 MG Oral Tablet Extended Release 24 Hour] 90 tablet 0    Sig: TAKE 1 TABLET BY MOUTH AT BEDTIME     Not Delegated - Psychiatry:  Antipsychotics - Second  Generation (Atypical) - quetiapine Failed - 07/09/2021  5:30 AM      Failed - This refill cannot be delegated      Passed - ALT in normal range and within 180 days    ALT  Date Value Ref Range Status  06/23/2021 18 0 - 32 IU/L Final   SGPT (ALT)  Date Value Ref Range Status  01/15/2014 28 12 - 78 U/L Final          Passed - AST in normal range and within 180 days    AST  Date Value Ref Range Status  06/23/2021 18 0 - 40 IU/L Final   SGOT(AST)  Date Value Ref Range Status  01/15/2014 27 15 - 37 Unit/L Final          Passed - Completed PHQ-2 or PHQ-9 in the last 360 days      Passed - Last BP in normal range    BP Readings from Last 1 Encounters:  07/01/21 139/63          Passed - Valid encounter within last 6 months    Recent Outpatient Visits  2 weeks ago Essential (primary) hypertension   [employer], [name], DO   9 months ago Diarrhea of presumed infectious origin   [facility], [place], DO   9 months ago Diarrhea of presumed infectious origin   [employer], [place] P, DO   10 months ago Acute cystitis without hematuria   [place], [name], DO   11 months ago Type 2 diabetes mellitus with hyperglycemia, without long-term current use of insulin [facility])   [place], [place], DO       Future Appointments             In 1 week Wynetta Emery, Barb Merino, DO [facility], [place]   In 3 months Vanga, Tally Due, MD [place]   In 9 months  [facility], [place]

## 2021-07-11 ENCOUNTER — Telehealth: Payer: Self-pay | Admitting: Family Medicine

## 2021-07-11 NOTE — Telephone Encounter (Signed)
She can bring them up here. If we can't take them we may be able to give them to [facility]. I'll send a message to Alwyn Ren to see what she says

## 2021-07-11 NOTE — Telephone Encounter (Signed)
Copied from [place] [id number]. Topic: General - Other >> Jul 11, 2021  2:56 PM Leward Quan A wrote: Reason for CRM: Patient called in to inform Dr Wynetta Emery that she just got a shipment of Trulicity and want to know if she can bring them up to the clinic before it closes today to donate. Say that she still have some that is in her refrigerator also Please call Ph# [phone]

## 2021-07-21 ENCOUNTER — Encounter: Payer: Self-pay | Admitting: Family Medicine

## 2021-07-21 ENCOUNTER — Ambulatory Visit (INDEPENDENT_AMBULATORY_CARE_PROVIDER_SITE_OTHER): Payer: Medicare Other | Admitting: Family Medicine

## 2021-07-21 ENCOUNTER — Other Ambulatory Visit: Payer: Self-pay

## 2021-07-21 DIAGNOSIS — I2510 Atherosclerotic heart disease of native coronary artery without angina pectoris: Secondary | ICD-10-CM

## 2021-07-21 DIAGNOSIS — F321 Major depressive disorder, single episode, moderate: Secondary | ICD-10-CM | POA: Diagnosis not present

## 2021-07-21 NOTE — Assessment & Plan Note (Signed)
Doing much better. Continue current regimen. Continue to monitor. Recheck 3 months. Refills up to date.

## 2021-07-21 NOTE — Progress Notes (Signed)
 BP 119/71   Pulse 66   Temp 98.1 F (36.7 C)   Wt 155 lb 3.2 oz (70.4 kg)   SpO2 99%   BMI 27.49 kg/m    Subjective:    [name], female    DOB: 01/09/1946, 75 y.o.   MRN: [id number]  HPI: Sarah White is a 75 y.o. female  Chief Complaint  Patient presents with   Depression   DEPRESSION Mood status: controlled Satisfied with current treatment?: yes Symptom severity: moderate  Duration of current treatment : chronic Side effects: no Medication compliance: excellent compliance Psychotherapy/counseling: no  Previous psychiatric medications: wellbutrin, seroquel, sertraline Depressed mood: yes Anxious mood: yes Anhedonia: no Significant weight loss or gain: yes Insomnia: no  Fatigue: no Feelings of worthlessness or guilt: no Impaired concentration/indecisiveness: no Suicidal ideations: no Hopelessness: no Crying spells: no Depression screen PHQ 2/9 07/21/2021 06/23/2021 04/25/2021 09/20/2020 05/17/2020  Decreased Interest 2 3 0 0 1  Down, Depressed, Hopeless 2 3 0 0 1  PHQ - 2 Score 4 6 0 0 2  Altered sleeping 1 3 - 3 2  Tired, decreased energy 1 3 - 0 -  Change in appetite 1 1 - 3 3  Feeling bad or failure about yourself  1 3 - 3 0  Trouble concentrating 1 1 - 0 2  Moving slowly or fidgety/restless 1 0 - 0 0  Suicidal thoughts 0 0 - 0 0  PHQ-9 Score 10 17 - 9 9  Difficult doing work/chores - Somewhat difficult - Not difficult at all Not difficult at all  Some recent data might be hidden    Relevant past medical, surgical, family and social history reviewed and updated as indicated. Interim medical history since our last visit reviewed. Allergies and medications reviewed and updated.  Review of Systems  Constitutional: Negative.   Respiratory: Negative.    Cardiovascular: Negative.   Gastrointestinal: Negative.   Musculoskeletal: Negative.   Neurological: Negative.   Psychiatric/Behavioral:  Positive for dysphoric mood. Negative for  agitation, behavioral problems, confusion, decreased concentration, hallucinations, self-injury, sleep disturbance and suicidal ideas. The patient is nervous/anxious. The patient is not hyperactive.    Per HPI unless specifically indicated above     Objective:    BP 119/71   Pulse 66   Temp 98.1 F (36.7 C)   Wt 155 lb 3.2 oz (70.4 kg)   SpO2 99%   BMI 27.49 kg/m   Wt Readings from Last 3 Encounters:  07/21/21 155 lb 3.2 oz (70.4 kg)  07/01/21 152 lb 12.8 oz (69.3 kg)  06/23/21 152 lb 3.2 oz (69 kg)    Physical Exam Vitals and nursing note reviewed.  Constitutional:      General: She is not in acute distress.    Appearance: Normal appearance. She is not ill-appearing, toxic-appearing or diaphoretic.  HENT:     Head: Normocephalic and atraumatic.     Right Ear: External ear normal.     Left Ear: External ear normal.     Nose: Nose normal.     Mouth/Throat:     Mouth: Mucous membranes are moist.     Pharynx: Oropharynx is clear.  Eyes:     General: No scleral icterus.       Right eye: No discharge.        Left eye: No discharge.     Extraocular Movements: Extraocular movements intact.     Conjunctiva/sclera: Conjunctivae normal.     Pupils: Pupils are   equal, round, and reactive to light.  Cardiovascular:     Rate and Rhythm: Normal rate and regular rhythm.     Pulses: Normal pulses.     Heart sounds: Normal heart sounds. No murmur heard.   No friction rub. No gallop.  Pulmonary:     Effort: Pulmonary effort is normal. No respiratory distress.     Breath sounds: Normal breath sounds. No stridor. No wheezing, rhonchi or rales.  Chest:     Chest wall: No tenderness.  Musculoskeletal:        General: Normal range of motion.     Cervical back: Normal range of motion and neck supple.  Skin:    General: Skin is warm and dry.     Capillary Refill: Capillary refill takes less than 2 seconds.     Coloration: Skin is not jaundiced or pale.     Findings: No bruising,  erythema, lesion or rash.  Neurological:     General: No focal deficit present.     Mental Status: She is alert and oriented to person, place, and time. Mental status is at baseline.  Psychiatric:        Mood and Affect: Mood normal.        Behavior: Behavior normal.        Thought Content: Thought content normal.        Judgment: Judgment normal.    Results for orders placed or performed in visit on 06/23/21  Microscopic Examination   Urine  Result Value Ref Range   WBC, UA 0-5 0 - 5 /hpf   RBC 0-2 0 - 2 /hpf   Epithelial Cells (non renal) 0-10 0 - 10 /hpf   Bacteria, UA Many (A) None seen/Few  CBC with Differential/Platelet  Result Value Ref Range   WBC 6.8 3.4 - 10.8 x10E3/uL   RBC 4.82 3.77 - 5.28 x10E6/uL   Hemoglobin 14.0 11.1 - 15.9 g/dL   Hematocrit 41.9 34.0 - 46.6 %   MCV 87 79 - 97 fL   MCH 29.0 26.6 - 33.0 pg   MCHC 33.4 31.5 - 35.7 g/dL   RDW 14.3 11.7 - 15.4 %   Platelets 146 (L) 150 - 450 x10E3/uL   Neutrophils 79 Not Estab. %   Lymphs 13 Not Estab. %   Monocytes 7 Not Estab. %   Eos 1 Not Estab. %   Basos 0 Not Estab. %   Neutrophils Absolute 5.3 1.4 - 7.0 x10E3/uL   Lymphocytes Absolute 0.9 0.7 - 3.1 x10E3/uL   Monocytes Absolute 0.5 0.1 - 0.9 x10E3/uL   EOS (ABSOLUTE) 0.1 0.0 - 0.4 x10E3/uL   Basophils Absolute 0.0 0.0 - 0.2 x10E3/uL   Immature Granulocytes 0 Not Estab. %   Immature Grans (Abs) 0.0 0.0 - 0.1 x10E3/uL  Bayer DCA Hb A1c Waived  Result Value Ref Range   HB A1C (BAYER DCA - WAIVED) 5.1 4.8 - 5.6 %  Comprehensive metabolic panel  Result Value Ref Range   Glucose 89 70 - 99 mg/dL   BUN 14 8 - 27 mg/dL   Creatinine, Ser 0.99 0.57 - 1.00 mg/dL   eGFR 61 >59 mL/min/1.73   BUN/Creatinine Ratio 14 12 - 28   Sodium 140 134 - 144 mmol/L   Potassium 3.8 3.5 - 5.2 mmol/L   Chloride 99 96 - 106 mmol/L   CO2 24 20 - 29 mmol/L   Calcium 9.3 8.7 - 10.3 mg/dL   Total Protein 6.1 6.0 - 8.5 g/dL  Albumin 4.2 3.7 - 4.7 g/dL   Globulin, Total 1.9  1.5 - 4.5 g/dL   Albumin/Globulin Ratio 2.2 1.2 - 2.2   Bilirubin Total 0.2 0.0 - 1.2 mg/dL   Alkaline Phosphatase 98 44 - 121 IU/L   AST 18 0 - 40 IU/L   ALT 18 0 - 32 IU/L  Lipid Panel w/o Chol/HDL Ratio  Result Value Ref Range   Cholesterol, Total 190 100 - 199 mg/dL   Triglycerides 243 (H) 0 - 149 mg/dL   HDL 54 >39 mg/dL   VLDL Cholesterol Cal 41 (H) 5 - 40 mg/dL   LDL Chol Calc (NIH) 95 0 - 99 mg/dL  Microalbumin, Urine Waived  Result Value Ref Range   Microalb, Ur Waived 80 (H) 0 - 19 mg/L   Creatinine, Urine Waived 300 10 - 300 mg/dL   Microalb/Creat Ratio 30-300 (H) <30 mg/g  TSH  Result Value Ref Range   TSH 2.940 0.450 - 4.500 uIU/mL  Urinalysis, Routine w reflex microscopic  Result Value Ref Range   Specific Gravity, UA >1.030 (H) 1.005 - 1.030   pH, UA 5.5 5.0 - 7.5   Color, UA Yellow Yellow   Appearance Ur Cloudy (A) Clear   Leukocytes,UA Trace (A) Negative   Protein,UA Trace (A) Negative/Trace   Glucose, UA Negative Negative   Ketones, UA Negative Negative   RBC, UA Trace (A) Negative   Bilirubin, UA Negative Negative   Urobilinogen, Ur 0.2 0.2 - 1.0 mg/dL   Nitrite, UA Negative Negative   Microscopic Examination See below:       Assessment & Plan:   Problem List Items Addressed This Visit       Other   Depression, major, single episode, moderate ([place])    Doing much better. Continue current regimen. Continue to monitor. Recheck 3 months. Refills up to date.         Follow up plan: Return in about 3 months (around 10/21/2021).   >15 minutes spent with son and patient today

## 2021-07-22 ENCOUNTER — Other Ambulatory Visit: Payer: Self-pay

## 2021-07-22 ENCOUNTER — Other Ambulatory Visit: Payer: Self-pay | Admitting: Gastroenterology

## 2021-07-22 DIAGNOSIS — K529 Noninfective gastroenteritis and colitis, unspecified: Secondary | ICD-10-CM | POA: Diagnosis not present

## 2021-07-22 NOTE — Progress Notes (Signed)
Sent in new blood thinner request to primary doctor

## 2021-07-23 NOTE — Telephone Encounter (Signed)
Called patient to advise of that we can not accept samples.  Patient understood and thanked [place] for calling her back.

## 2021-07-27 LAB — PANCREATIC ELASTASE, FECAL: Pancreatic Elastase, Fecal: 428 ug Elast./g (ref >200)

## 2021-07-28 LAB — CALPROTECTIN, FECAL: Calprotectin, Fecal: 32 ug/g (ref 0–120)

## 2021-08-02 ENCOUNTER — Encounter: Payer: Self-pay | Admitting: Family Medicine

## 2021-08-04 NOTE — Telephone Encounter (Signed)
appt

## 2021-08-04 NOTE — Telephone Encounter (Signed)
Lmom for patient to schedule appointment.

## 2021-08-05 NOTE — Telephone Encounter (Signed)
Called and spoke to pt, she declined appointment at this time but will call back if needed.

## 2021-08-06 ENCOUNTER — Telehealth: Payer: Self-pay | Admitting: Gastroenterology

## 2021-08-06 ENCOUNTER — Other Ambulatory Visit: Payer: Self-pay

## 2021-08-06 DIAGNOSIS — K529 Noninfective gastroenteritis and colitis, unspecified: Secondary | ICD-10-CM

## 2021-08-06 DIAGNOSIS — R1319 Other dysphagia: Secondary | ICD-10-CM

## 2021-08-06 NOTE — Progress Notes (Signed)
Rescheduled procedure patient still has prep sent new communications and sent new refferal to nicole

## 2021-08-06 NOTE — Telephone Encounter (Signed)
Patient wants a returned call from [place]. Have a question about cutting medication in half.

## 2021-08-07 ENCOUNTER — Encounter: Admission: RE | Payer: Self-pay | Source: Home / Self Care

## 2021-08-07 ENCOUNTER — Ambulatory Visit: Admission: RE | Admit: 2021-08-07 | Payer: Medicare Other | Source: Home / Self Care | Admitting: Gastroenterology

## 2021-08-07 SURGERY — COLONOSCOPY WITH PROPOFOL
Anesthesia: General

## 2021-08-17 ENCOUNTER — Other Ambulatory Visit: Payer: Self-pay | Admitting: Family Medicine

## 2021-08-18 NOTE — Telephone Encounter (Signed)
Requested medication (s) are due for refill today:   Neither on med list  Requested medication (s) are on the active medication list:   No for either of them  Future visit scheduled:   Yes   Last ordered: Not sure since not on her med list.  Returned because Imdur is from a historical provider and Flexeril is a non delegated refill that's not on her list.   Requested Prescriptions  Pending Prescriptions Disp Refills   isosorbide mononitrate (IMDUR) 60 MG 24 hr tablet [Pharmacy Med Name: Isosorbide Mononitrate ER 60 MG Oral Tablet Extended Release 24 Hour] 90 tablet 0    Sig: Take 1 tablet by mouth once daily     Cardiovascular:  Nitrates Passed - 08/17/2021  6:30 AM      Passed - Last BP in normal range    BP Readings from Last 1 Encounters:  07/21/21 119/71          Passed - Last Heart Rate in normal range    Pulse Readings from Last 1 Encounters:  07/21/21 66          Passed - Valid encounter within last 12 months    Recent Outpatient Visits           4 weeks ago Depression, major, single episode, moderate ([place])   [place], [name], DO   1 month ago Essential (primary) hypertension   [facility] Johnson, [name], DO   10 months ago Diarrhea of presumed infectious origin   [employer], [place] P, DO   11 months ago Diarrhea of presumed infectious origin   [employer], [place] P, DO   11 months ago Acute cystitis without hematuria   [employer], Barb Merino, DO       Future Appointments             In 2 months Wynetta Emery, Barb Merino, DO [facility], [place]   In 2 months Vanga, Tally Due, MD [place]   In 8 months  [facility]             cyclobenzaprine (FLEXERIL) 10 MG tablet [employer] Med Name: Cyclobenzaprine HCl 10 MG Oral Tablet] 30 tablet 0    Sig: TAKE 1 TABLET BY MOUTH THREE TIMES DAILY AS NEEDED FOR MUSCLE SPASM. DO NOT  DRIVE ON THIS MED. WILL MAKE YOU SLEEPY     Not Delegated - Analgesics:  Muscle Relaxants Failed - 08/17/2021  6:30 AM      Failed - This refill cannot be delegated      Passed - Valid encounter within last 6 months    Recent Outpatient Visits           4 weeks ago Depression, major, single episode, moderate ([place])   [place], [name], DO   1 month ago Essential (primary) hypertension   [place], [name], DO   10 months ago Diarrhea of presumed infectious origin   [employer], [place] P, DO   11 months ago Diarrhea of presumed infectious origin   [employer], [place], DO   11 months ago Acute cystitis without hematuria   [place], Barb Merino, DO       Future Appointments             In 2 months Wynetta Emery, Barb Merino, DO [facility]  In 2 months Vanga, Tally Due, MD [place]   In 8 months  [facility]

## 2021-09-09 ENCOUNTER — Telehealth: Payer: Self-pay

## 2021-09-09 ENCOUNTER — Encounter: Payer: Self-pay | Admitting: Gastroenterology

## 2021-09-09 NOTE — Chronic Care Management (AMB) (Signed)
Chronic Care Management Pharmacy Assistant   Name: Sarah White  MRN: [id number] DOB: [birth year]   Reason for Encounter: Disease State General   Recent office visits:  07/21/21-Megan Annia Friendly, DO (PCP) Seen for depression. Follow up in 3 months. 06/23/21-Megan Annia Friendly (PCP) General follow up visit. Labs ordered. Increase her seroquel to 100mg . Ambulatory referral to Gastroenterology. EKG ordered. Flu vaccine given. Follow up in 4 weeks.  Recent consult visits:  07/01/21-Rohini Raeanne Gathers, MD (Gastroenterology) Initial patient visit. Follow up in 3-4 months. 06/25/21-Dwayne Aida Raider (Cardiology)   Hospital visits:  None in previous 6 months  Medications: Outpatient Encounter Medications as of 09/09/2021  Medication Sig Note   albuterol (ACCUNEB) 0.63 MG/3ML nebulizer solution Take 3 mLs (0.63 mg total) by nebulization every 6 (six) hours as needed for wheezing.    albuterol (VENTOLIN HFA) 108 (90 Base) MCG/ACT inhaler Inhale 2 puffs into the lungs every 6 (six) hours as needed for wheezing or shortness of breath. 2020-04-25: Taking 2 puffs BID   amiodarone (PACERONE) 200 MG tablet Take 0.5 tablets by mouth daily.    aspirin EC 81 MG tablet Take 81 mg by mouth at bedtime.     budesonide-formoterol (SYMBICORT) 160-4.5 MCG/ACT inhaler Inhale 2 puffs into the lungs 2 (two) times daily.    buPROPion (WELLBUTRIN XL) 150 MG 24 hr tablet Take by mouth.    carvedilol (COREG) 12.5 MG tablet Take 12.5 mg by mouth daily.    clopidogrel (PLAVIX) 75 MG tablet Take 1 tablet (75 mg total) by mouth daily at 6 (six) AM.    cyclobenzaprine (FLEXERIL) 10 MG tablet TAKE 1 TABLET BY MOUTH THREE TIMES DAILY AS NEEDED FOR MUSCLE SPASM. DO NOT DRIVE ON THIS MED. WILL MAKE YOU SLEEPY    diphenhydrAMINE (BENADRYL) 25 MG tablet Take 25 mg by mouth at bedtime as needed for allergies. Dies not take regularly 2020-04-25: Taking 1 QPM   donepezil (ARICEPT) 10 MG tablet TAKE 1 TABLET BY MOUTH NIGHTLY  FOR 30 DAYS    furosemide (LASIX) 40 MG tablet Take 1 tablet by mouth once daily    isosorbide mononitrate (IMDUR) 60 MG 24 hr tablet Take 1 tablet by mouth once daily    nitroGLYCERIN (NITROSTAT) 0.4 MG SL tablet DISSOLVE ONE TABLET UNDER THE TONGUE EVERY 5 MINUTES AS NEEDED FOR CHEST PAIN.  DO NOT EXCEED A TOTAL OF 3 DOSES IN 15 MINUTES    potassium chloride SA (KLOR-CON) 20 MEQ tablet Take 1 tablet (20 mEq total) by mouth daily.    QUEtiapine (SEROQUEL XR) 50 MG TB24 24 hr tablet Take 2 tablets (100 mg total) by mouth at bedtime.    rosuvastatin (CRESTOR) 20 MG tablet Take 1 tablet (20 mg total) by mouth daily.    sacubitril-valsartan (ENTRESTO) 49-51 MG Take 1 tablet by mouth 2 (two) times daily.     sertraline (ZOLOFT) 100 MG tablet Take 2 tablets (200 mg total) by mouth daily.    silver sulfADIAZINE (SILVADENE) 1 % cream Apply 1 application topically daily. (Patient taking differently: Apply 1 application topically as needed.)    tiotropium (SPIRIVA HANDIHALER) 18 MCG inhalation capsule PLACE 1 CAPSULE INTO INHALER AND INHALE DAILY    traZODone (DESYREL) 50 MG tablet Take 1 tablet (50 mg total) by mouth at bedtime as needed for sleep.    triamcinolone ointment (KENALOG) 0.5 % Apply 1 application topically 2 (two) times daily.    No facility-administered encounter medications on file as of 09/09/2021.  Unsuccessful attempt to complete assessment call. I have called 3x and left 3 voicemail's to return phone call.   Care Gaps: Zoster Vaccines- Shingrix:Never done OPHTHALMOLOGY EXAM:Last completed: Sep 17, 2015 COVID-19 Vaccine:Last completed: Sep 24, 2020 FOOT EXAM:Last completed: Jan 18, 2020  Star Rating Drugs: Rosuvastatin 20 mg Last filled:02/14/21 90 DS  Corrie Mckusick, [place]

## 2021-09-10 ENCOUNTER — Ambulatory Visit: Payer: Medicare Other | Admitting: Anesthesiology

## 2021-09-10 ENCOUNTER — Encounter
Admission: RE | Disposition: A | Discharge status: Home / Self Care | Payer: Self-pay | Source: Home / Self Care | Attending: Gastroenterology

## 2021-09-10 ENCOUNTER — Encounter: Payer: Self-pay | Admitting: Gastroenterology

## 2021-09-10 ENCOUNTER — Ambulatory Visit
Admission: RE | Admit: 2021-09-10 | Discharge: 2021-09-10 | Disposition: A | Discharge status: Home / Self Care | Payer: Medicare Other | Attending: Gastroenterology | Admitting: Gastroenterology

## 2021-09-10 DIAGNOSIS — R1314 Dysphagia, pharyngoesophageal phase: Secondary | ICD-10-CM | POA: Insufficient documentation

## 2021-09-10 DIAGNOSIS — E785 Hyperlipidemia, unspecified: Secondary | ICD-10-CM | POA: Diagnosis not present

## 2021-09-10 DIAGNOSIS — D124 Benign neoplasm of descending colon: Secondary | ICD-10-CM | POA: Insufficient documentation

## 2021-09-10 DIAGNOSIS — R195 Other fecal abnormalities: Secondary | ICD-10-CM | POA: Insufficient documentation

## 2021-09-10 DIAGNOSIS — D122 Benign neoplasm of ascending colon: Secondary | ICD-10-CM | POA: Diagnosis not present

## 2021-09-10 DIAGNOSIS — D120 Benign neoplasm of cecum: Secondary | ICD-10-CM | POA: Insufficient documentation

## 2021-09-10 DIAGNOSIS — K259 Gastric ulcer, unspecified as acute or chronic, without hemorrhage or perforation: Secondary | ICD-10-CM

## 2021-09-10 DIAGNOSIS — R1319 Other dysphagia: Secondary | ICD-10-CM

## 2021-09-10 DIAGNOSIS — K2950 Unspecified chronic gastritis without bleeding: Secondary | ICD-10-CM | POA: Insufficient documentation

## 2021-09-10 DIAGNOSIS — F17210 Nicotine dependence, cigarettes, uncomplicated: Secondary | ICD-10-CM | POA: Insufficient documentation

## 2021-09-10 DIAGNOSIS — E119 Type 2 diabetes mellitus without complications: Secondary | ICD-10-CM | POA: Diagnosis not present

## 2021-09-10 DIAGNOSIS — K319 Disease of stomach and duodenum, unspecified: Secondary | ICD-10-CM | POA: Diagnosis not present

## 2021-09-10 DIAGNOSIS — K529 Noninfective gastroenteritis and colitis, unspecified: Secondary | ICD-10-CM | POA: Diagnosis not present

## 2021-09-10 DIAGNOSIS — K635 Polyp of colon: Secondary | ICD-10-CM

## 2021-09-10 DIAGNOSIS — K5730 Diverticulosis of large intestine without perforation or abscess without bleeding: Secondary | ICD-10-CM | POA: Insufficient documentation

## 2021-09-10 DIAGNOSIS — K5790 Diverticulosis of intestine, part unspecified, without perforation or abscess without bleeding: Secondary | ICD-10-CM | POA: Diagnosis not present

## 2021-09-10 DIAGNOSIS — K3189 Other diseases of stomach and duodenum: Secondary | ICD-10-CM | POA: Diagnosis not present

## 2021-09-10 DIAGNOSIS — D123 Benign neoplasm of transverse colon: Secondary | ICD-10-CM | POA: Insufficient documentation

## 2021-09-10 HISTORY — PX: COLONOSCOPY WITH PROPOFOL: SHX5780

## 2021-09-10 HISTORY — PX: ESOPHAGOGASTRODUODENOSCOPY: SHX5428

## 2021-09-10 LAB — GLUCOSE, CAPILLARY: Glucose-Capillary: 83 mg/dL (ref 70–99)

## 2021-09-10 SURGERY — COLONOSCOPY WITH PROPOFOL
Anesthesia: General

## 2021-09-10 MED ORDER — LIDOCAINE HCL (CARDIAC) PF 100 MG/5ML IV SOSY
PREFILLED_SYRINGE | INTRAVENOUS | Status: DC | PRN
  Administered 2021-09-10: 50 mg via INTRAVENOUS

## 2021-09-10 MED ORDER — SODIUM CHLORIDE 0.9 % IV SOLN: INTRAVENOUS | Status: DC

## 2021-09-10 MED ORDER — PANTOPRAZOLE SODIUM 40 MG PO TBEC: 40.0000 mg | DELAYED_RELEASE_TABLET | Freq: Two times a day (BID) | ORAL | 3 refills | Status: DC

## 2021-09-10 MED ORDER — PROPOFOL 500 MG/50ML IV EMUL
INTRAVENOUS | Status: AC
  Filled 2021-09-10: qty 50

## 2021-09-10 MED ORDER — PROPOFOL 500 MG/50ML IV EMUL
INTRAVENOUS | Status: DC | PRN
  Administered 2021-09-10: 100 ug/kg/min via INTRAVENOUS

## 2021-09-10 MED ORDER — PROPOFOL 10 MG/ML IV BOLUS
INTRAVENOUS | Status: DC | PRN
Start: 2021-09-10 — End: 2021-09-10
  Administered 2021-09-10: 20 mg via INTRAVENOUS
  Administered 2021-09-10: 80 mg via INTRAVENOUS

## 2021-09-10 MED ORDER — STERILE WATER FOR IRRIGATION IR SOLN
Status: DC | PRN
  Administered 2021-09-10: 120 mL

## 2021-09-10 MED ORDER — PANTOPRAZOLE SODIUM 40 MG PO TBEC
40.0000 mg | DELAYED_RELEASE_TABLET | Freq: Two times a day (BID) | ORAL | 3 refills | Status: DC
Start: 2021-09-10 — End: 2021-10-23

## 2021-09-10 NOTE — Anesthesia Preprocedure Evaluation (Addendum)
Anesthesia Evaluation  Patient identified by MRN, date of birth, ID band Patient awake    Reviewed: Allergy & Precautions, NPO status , Patient's Chart, lab work & pertinent test results  History of Anesthesia Complications Negative for: history of anesthetic complications  Airway Mallampati: I   Neck ROM: Full    Dental  (+) Lower Dentures, Upper Dentures   Pulmonary COPD, Current Smoker (1 ppd)Patient did not abstain from smoking.,    Pulmonary exam normal breath sounds clear to auscultation       Cardiovascular hypertension, + CAD (s/p stents), + Peripheral Vascular Disease (carotid stenosis s/p ICA stent on Plavix) and +CHF (EF 30-35%)  Normal cardiovascular exam Rhythm:Regular Rate:Normal  ECG 06/23/21:  First degree A-V block  PRi = 222 -Left bundle branch block.   -Right atrial enlargement.  Myocardial perfusion 01/03/20:  Abnormal myocardial perfusion scan overall moderately  depressed left ventricular function between 35-40% there is lateral apical inferior apical hypokinesis as well as ischemia with defect with redistribution. Baseline EKG left bundle branch block which limits interpretation conclusion abnormal Myoview with evidence of depressed left ventricular function and ischemia would recommend an invasive strategy  based on clinical symptoms.    Neuro/Psych PSYCHIATRIC DISORDERS Depression negative neurological ROS     GI/Hepatic GERD  ,  Endo/Other  diabetes, Type 2  Renal/GU Renal disease (CKD)     Musculoskeletal   Abdominal   Peds  Hematology negative hematology ROS (+)   Anesthesia Other Findings Reviewed 04/10/20 cardiology note.  Reproductive/Obstetrics                            Anesthesia Physical Anesthesia Plan  ASA: 3  Anesthesia Plan: General   Post-op Pain Management:    Induction: Intravenous  PONV Risk Score and Plan: 2 and Propofol infusion, TIVA  and Treatment may vary due to age or medical condition  Airway Management Planned: Natural Airway  Additional Equipment:   Intra-op Plan:   Post-operative Plan:   Informed Consent: I have reviewed the patients History and Physical, chart, labs and discussed the procedure including the risks, benefits and alternatives for the proposed anesthesia with the patient or authorized representative who has indicated his/her understanding and acceptance.       Plan Discussed with: CRNA  Anesthesia Plan Comments: (LMA/GETA backup discussed.  Patient consented for risks of anesthesia including but not limited to:  - adverse reactions to medications - damage to eyes, teeth, lips or other oral mucosa - nerve damage due to positioning  - sore throat or hoarseness - damage to heart, brain, nerves, lungs, other parts of body or loss of life  Informed patient about role of CRNA in peri- and intra-operative care.  Patient voiced understanding.)        Anesthesia Quick Evaluation

## 2021-09-10 NOTE — Op Note (Signed)
[facility] Gastroenterology Patient Name: Sarah White Procedure Date: 09/10/2021 8:49 AM MRN: [id number] Account #: [id number] Date of Birth: [birth year] Admit Type: Outpatient Age: 76 Room: [facility] ENDO ROOM 4 Gender: Female Note Status: Finalized Instrument Name: Upper Endoscope [id number] Procedure:             Upper GI endoscopy Indications:           Esophageal dysphagia, Diarrhea Providers:             Lin Landsman MD, MD Referring MD:          Valerie Roys (Referring MD) Medicines:             General Anesthesia Complications:         No immediate complications. Estimated blood loss: None. Procedure:             Pre-Anesthesia Assessment:                        - Prior to the procedure, a History and Physical was                         performed, and patient medications and allergies were                         reviewed. The patient is competent. The risks and                         benefits of the procedure and the sedation options and                         risks were discussed with the patient. All questions                         were answered and informed consent was obtained.                         Patient identification and proposed procedure were                         verified by the physician, the nurse, the                         anesthesiologist, the anesthetist and the technician                         in the pre-procedure area in the procedure room in the                         endoscopy suite. Mental Status Examination: alert and                         oriented. Airway Examination: normal oropharyngeal                         airway and neck mobility. Respiratory Examination:                         clear to auscultation. CV Examination: normal.  Prophylactic Antibiotics: The patient does not require                         prophylactic antibiotics. Prior Anticoagulants: The                          patient has taken Plavix (clopidogrel), last dose was                         5 days prior to procedure. ASA Grade Assessment: III -                         A patient with severe systemic disease. After                         reviewing the risks and benefits, the patient was                         deemed in satisfactory condition to undergo the                         procedure. The anesthesia plan was to use general                         anesthesia. Immediately prior to administration of                         medications, the patient was re-assessed for adequacy                         to receive sedatives. The heart rate, respiratory                         rate, oxygen saturations, blood pressure, adequacy of                         pulmonary ventilation, and response to care were                         monitored throughout the procedure. The physical                         status of the patient was re-assessed after the                         procedure.                        After obtaining informed consent, the endoscope was                         passed under direct vision. Throughout the procedure,                         the patient's blood pressure, pulse, and oxygen                         saturations were monitored continuously. The Endoscope  was introduced through the mouth, and advanced to the                         second part of duodenum. The upper GI endoscopy was                         accomplished without difficulty. The patient tolerated                         the procedure well. Findings:      The duodenal bulb and second portion of the duodenum were normal.       Biopsies for histology were taken with a cold forceps for evaluation of       celiac disease.      Striped moderately erythematous mucosa without bleeding was found in the       gastric body, at the incisura and in the gastric antrum. Biopsies were       taken with a cold  forceps for Helicobacter pylori testing.      Multiple dispersed small erosions with stigmata of recent bleeding were       found in the gastric body.      Two non-bleeding superficial gastric ulcers with a clean ulcer base       (Forrest Class III) were found on the greater curvature of the gastric       body. The largest lesion was 5 mm in largest dimension.      The cardia and gastric fundus were normal on retroflexion.      Esophagogastric landmarks were identified: the gastroesophageal junction       was found at 38 cm from the incisors.      The gastroesophageal junction and examined esophagus were normal. Impression:            - Normal duodenal bulb and second portion of the                         duodenum. Biopsied.                        - Erythematous mucosa in the gastric body, incisura                         and antrum. Biopsied.                        - Erosive gastropathy with stigmata of recent bleeding.                        - Non-bleeding gastric ulcers with a clean ulcer base                         (Forrest Class III).                        - Esophagogastric landmarks identified.                        - Normal gastroesophageal junction and esophagus. Recommendation:        - Await pathology results.                        -  Follow an antireflux regimen.                        - Use Protonix (pantoprazole) 40 mg PO BID                         indefinitely.                        - Proceed with colonoscopy as scheduled                        See colonoscopy report Procedure Code(s):     --- Professional ---                        [phone], Esophagogastroduodenoscopy, flexible,                         transoral; with biopsy, single or multiple Diagnosis Code(s):     --- Professional ---                        K31.89, Other diseases of stomach and duodenum                        K92.2, Gastrointestinal hemorrhage, unspecified                        K25.9, Gastric  ulcer, unspecified as acute or chronic,                         without hemorrhage or perforation                        R13.14, Dysphagia, pharyngoesophageal phase                        R19.7, Diarrhea, unspecified CPT copyright 2019 American Medical Association. All rights reserved. The codes documented in this report are preliminary and upon coder review may  be revised to meet current compliance requirements. Dr. Ulyess Mort Lin Landsman MD, MD 09/10/2021 9:09:05 AM This report has been signed electronically. Number of Addenda: 0 Note Initiated On: 09/10/2021 8:49 AM Estimated Blood Loss:  Estimated blood loss: none.      [facility]

## 2021-09-10 NOTE — H&P (Signed)
Cephas Darby, MD [address]  [place]  [place], [place] [address]  Main: [phone]  Fax: [phone] Pager: [phone]  Primary Care Physician:  Valerie Roys, DO Primary Gastroenterologist:  Dr. Cephas Darby  Pre-Procedure History & Physical: HPI:  Sarah White is a 76 y.o. female is here for an endoscopy and colonoscopy.   Past Medical History:  Diagnosis Date   Acute pancreatitis 09/09/2018   Acute respiratory failure with hypoxia and hypercarbia (HCC) 07/22/2015   Cancer ([place])    uterine   CHF (congestive heart failure) (HCC)    COPD (chronic obstructive pulmonary disease) ([place])    Diabetes mellitus without complication ([place])    Encephalopathy acute 07/22/2015   Hypertension    Pneumonia    November 2016   Pulmonary edema 07/22/2015   Squamous cell cancer of skin of upper arm, right     Past Surgical History:  Procedure Laterality Date   CAROTID PTA/STENT INTERVENTION Right 03/06/2020   Procedure: CAROTID PTA/STENT INTERVENTION;  Surgeon: Katha Cabal, MD;  Location: [facility];  Service: Cardiovascular;  Laterality: Right;   CARPAL TUNNEL RELEASE Bilateral    CESAREAN SECTION     CHOLECYSTECTOMY N/A 09/12/2018   Procedure: LAPAROSCOPIC CHOLECYSTECTOMY WITH INTRAOPERATIVE CHOLANGIOGRAM;  Surgeon: Herbert Pun, MD;  Location: [facility];  Service: General;  Laterality: N/A;   CORONARY ANGIOPLASTY WITH STENT PLACEMENT     KNEE SURGERY Left    LEFT HEART CATH AND CORONARY ANGIOGRAPHY Right 11/26/2016   Procedure: Left Heart Cath and Coronary Angiography;  Surgeon: Isaias Cowman, MD;  Location: [facility];  Service: Cardiovascular;  Laterality: Right;   LEFT HEART CATH AND CORONARY ANGIOGRAPHY N/A 01/23/2020   Procedure: LEFT HEART CATH AND CORONARY ANGIOGRAPHY;  Surgeon: Yolonda Kida, MD;  Location: [facility];  Service: Cardiovascular;  Laterality: N/A;   TOTAL ABDOMINAL HYSTERECTOMY       Prior to Admission medications   Medication Sig Start Date End Date Taking? Authorizing Provider  albuterol (ACCUNEB) 0.63 MG/3ML nebulizer solution Take 3 mLs (0.63 mg total) by nebulization every 6 (six) hours as needed for wheezing. 10/12/20   Johnson, [name], DO  albuterol (VENTOLIN HFA) 108 (90 Base) MCG/ACT inhaler Inhale 2 puffs into the lungs every 6 (six) hours as needed for wheezing or shortness of breath. 09/20/19   Johnson, [name], DO  amiodarone (PACERONE) 200 MG tablet Take 0.5 tablets by mouth daily. 05/13/21   [provider]  aspirin EC 81 MG tablet Take 81 mg by mouth at bedtime.     [provider]  budesonide-formoterol (SYMBICORT) 160-4.5 MCG/ACT inhaler Inhale 2 puffs into the lungs 2 (two) times daily. 06/23/21   Johnson, [name], DO  buPROPion (WELLBUTRIN XL) 150 MG 24 hr tablet Take by mouth.    [provider]  carvedilol (COREG) 12.5 MG tablet Take 12.5 mg by mouth daily.    [provider]  clopidogrel (PLAVIX) 75 MG tablet Take 1 tablet (75 mg total) by mouth daily at 6 (six) AM. 06/23/21   Johnson, [name], DO  cyclobenzaprine (FLEXERIL) 10 MG tablet TAKE 1 TABLET BY MOUTH THREE TIMES DAILY AS NEEDED FOR MUSCLE SPASM. DO NOT DRIVE ON THIS MED. WILL MAKE YOU SLEEPY 08/19/21   Park Liter P, DO  diphenhydrAMINE (BENADRYL) 25 MG tablet Take 25 mg by mouth at bedtime as needed for allergies. Dies not take regularly    [provider]  donepezil (  ARICEPT) 10 MG tablet TAKE 1 TABLET BY MOUTH NIGHTLY FOR 30 DAYS 06/16/21   [provider]  furosemide (LASIX) 40 MG tablet Take 1 tablet by mouth once daily 06/12/21   Park Liter P, DO  isosorbide mononitrate (IMDUR) 60 MG 24 hr tablet Take 1 tablet by mouth once daily 08/19/21   Johnson, [name], DO  nitroGLYCERIN (NITROSTAT) 0.4 MG SL tablet DISSOLVE ONE TABLET UNDER THE TONGUE EVERY 5 MINUTES AS NEEDED FOR CHEST PAIN.  DO NOT EXCEED A TOTAL OF 3 DOSES IN 15 MINUTES  05/13/21   McElwee, Lauren A, NP  potassium chloride SA (KLOR-CON) 20 MEQ tablet Take 1 tablet (20 mEq total) by mouth daily. 06/23/21   Johnson, [name], DO  QUEtiapine (SEROQUEL XR) 50 MG TB24 24 hr tablet Take 2 tablets (100 mg total) by mouth at bedtime. 06/23/21   Johnson, [name], DO  rosuvastatin (CRESTOR) 20 MG tablet Take 1 tablet (20 mg total) by mouth daily. 06/23/21   Johnson, [name], DO  sacubitril-valsartan (ENTRESTO) 49-51 MG Take 1 tablet by mouth 2 (two) times daily.     [provider]  sertraline (ZOLOFT) 100 MG tablet Take 2 tablets (200 mg total) by mouth daily. 06/23/21   Johnson, [name], DO  silver sulfADIAZINE (SILVADENE) 1 % cream Apply 1 application topically daily. Patient taking differently: Apply 1 application topically as needed. 02/09/20   Johnson, [name], DO  tiotropium (SPIRIVA HANDIHALER) 18 MCG inhalation capsule PLACE 1 CAPSULE INTO INHALER AND INHALE DAILY 06/23/21   Johnson, [name], DO  traZODone (DESYREL) 50 MG tablet Take 1 tablet (50 mg total) by mouth at bedtime as needed for sleep. 06/23/21   Johnson, [name], DO  triamcinolone ointment (KENALOG) 0.5 % Apply 1 application topically 2 (two) times daily. 08/21/19   Park Liter P, DO    Allergies as of 08/07/2021 - Review Complete 07/21/2021  Allergen Reaction Noted   Amlodipine Swelling 04/08/2016   Atorvastatin Other (See Comments) 09/20/2019   Codeine Nausea And Vomiting 07/29/2015   Hctz [hydrochlorothiazide] Other (See Comments) 03/12/2016   Lisinopril Other (See Comments) 02/04/2016   Metformin and related Nausea Only 11/04/2015    Family History  Problem Relation Age of Onset   Heart disease Mother     Social History   Socioeconomic History   Marital status: Divorced    Spouse name: Not on file   Number of children: Not on file   Years of education: Not on file   Highest education level: Some college, no degree  Occupational History   Occupation: retired  Tobacco Use    Smoking status: Every Day    Packs/day: 1.00    Types: Cigarettes   Smokeless tobacco: Never  Vaping Use   Vaping Use: Never used  Substance and Sexual Activity   Alcohol use: No   Drug use: No   Sexual activity: Not Currently  Other Topics Concern   Not on file  Social History Narrative   Not on file   Social Determinants of Health   Financial Resource Strain: Low Risk    Difficulty of Paying Living Expenses: Not hard at all  Food Insecurity: No Food Insecurity   Worried About Charity fundraiser in the Last Year: Never true   [place] in the Last Year: Never true  Transportation Needs: No Transportation Needs   Lack of Transportation (Medical): No   Lack of Transportation (Non-Medical): No  Physical Activity: Inactive  Days of Exercise per Week: 0 days   Minutes of Exercise per Session: 0 min  Stress: No Stress Concern Present   Feeling of Stress : Not at all  Social Connections: Not on file  Intimate Partner Violence: Not on file    Review of Systems: See HPI, otherwise negative ROS  Physical Exam: BP (!) 183/83    Pulse 93    Temp (!) 97 F (36.1 C) (Temporal)    Resp 16    Ht 5\' 4"  (1.626 m)    Wt 65.3 kg    SpO2 97%    BMI 24.72 kg/m  General:   Alert,  pleasant and cooperative in NAD Head:  Normocephalic and atraumatic. Neck:  Supple; no masses or thyromegaly. Lungs:  Clear throughout to auscultation.    Heart:  Regular rate and rhythm. Abdomen:  Soft, nontender and nondistended. Normal bowel sounds, without guarding, and without rebound.   Neurologic:  Alert and  oriented x4;  grossly normal neurologically.  Impression/Plan: Sarah White is here for an endoscopy and colonoscopy to be performed for chronic diarrhea, dysphagia, FOBT positive  Risks, benefits, limitations, and alternatives regarding  endoscopy and colonoscopy have been reviewed with the patient.  Questions have been answered.  All parties agreeable.   Sherri Sear, MD   09/10/2021, 8:46 AM

## 2021-09-10 NOTE — Anesthesia Postprocedure Evaluation (Signed)
Anesthesia Post Note  Patient: Sarah White  Procedure(s) Performed: COLONOSCOPY WITH PROPOFOL ESOPHAGOGASTRODUODENOSCOPY (EGD)  Patient location during evaluation: PACU Anesthesia Type: General Level of consciousness: awake and alert, oriented and patient cooperative Pain management: pain level controlled Vital Signs Assessment: post-procedure vital signs reviewed and stable Respiratory status: spontaneous breathing, nonlabored ventilation and respiratory function stable Cardiovascular status: blood pressure returned to baseline and stable Postop Assessment: adequate PO intake Anesthetic complications: no   No notable events documented.   Last Vitals:  Vitals:   09/10/21 1033 09/10/21 1037  BP: (!) 169/77 (!) 177/72  Pulse: 69   Resp: 16   Temp:    SpO2: 98%     Last Pain:  Vitals:   09/10/21 1027  TempSrc:   PainSc: 0-No pain                 Darrin Nipper

## 2021-09-10 NOTE — Op Note (Signed)
[facility] Gastroenterology Patient Name: Ronna Herskowitz Procedure Date: 09/10/2021 8:49 AM MRN: [id number] Account #: [id number] Date of Birth: October 02, 1945 Admit Type: Outpatient Age: 76 Room: [facility] ENDO ROOM 4 Gender: Female Note Status: Finalized Instrument Name: Colonoscope [id number] Procedure:             Colonoscopy Indications:           Last colonoscopy: August 2004, Chronic diarrhea,                         Clinically significant diarrhea of unexplained origin,                         Positive fecal immunochemical test Providers:             Lin Landsman MD, MD Referring MD:          Valerie Roys (Referring MD) Medicines:             General Anesthesia Complications:         No immediate complications. Estimated blood loss: None. Procedure:             Pre-Anesthesia Assessment:                        - Prior to the procedure, a History and Physical was                         performed, and patient medications and allergies were                         reviewed. The patient is competent. The risks and                         benefits of the procedure and the sedation options and                         risks were discussed with the patient. All questions                         were answered and informed consent was obtained.                         Patient identification and proposed procedure were                         verified by the physician, the nurse, the                         anesthesiologist, the anesthetist and the technician                         in the pre-procedure area in the procedure room in the                         endoscopy suite. Mental Status Examination: alert and                         oriented. Airway Examination: normal oropharyngeal  airway and neck mobility. Respiratory Examination:                         clear to auscultation. CV Examination: normal.                         Prophylactic  Antibiotics: The patient does not require                         prophylactic antibiotics. Prior Anticoagulants: The                         patient has taken Plavix (clopidogrel), last dose was                         5 days prior to procedure. ASA Grade Assessment: III -                         A patient with severe systemic disease. After                         reviewing the risks and benefits, the patient was                         deemed in satisfactory condition to undergo the                         procedure. The anesthesia plan was to use general                         anesthesia. Immediately prior to administration of                         medications, the patient was re-assessed for adequacy                         to receive sedatives. The heart rate, respiratory                         rate, oxygen saturations, blood pressure, adequacy of                         pulmonary ventilation, and response to care were                         monitored throughout the procedure. The physical                         status of the patient was re-assessed after the                         procedure.                        After obtaining informed consent, the colonoscope was                         passed under direct vision. Throughout the procedure,  the patient's blood pressure, pulse, and oxygen                         saturations were monitored continuously. The                         Colonoscope was introduced through the anus and                         advanced to the the terminal ileum, with                         identification of the appendiceal orifice and IC                         valve. The colonoscopy was performed without                         difficulty. The patient tolerated the procedure well.                         The quality of the bowel preparation was evaluated                         using the BBPS [facility] Bowel Preparation Scale)  with                         scores of: Right Colon = 3, Transverse Colon = 3 and                         Left Colon = 3 (entire mucosa seen well with no                         residual staining, small fragments of stool or opaque                         liquid). The total BBPS score equals 9. Findings:      The perianal and digital rectal examinations were normal. Pertinent       negatives include normal sphincter tone and no palpable rectal lesions.      The terminal ileum appeared normal.      A 3 mm polyp was found in the cecum. The polyp was sessile. The polyp       was removed with a cold snare. Resection and retrieval were complete.       Estimated blood loss: none.      A 12 mm polyp was found in the cecum. The polyp was carpet-like and       sessile. Preparations were made for mucosal resection. NBI was done to       mark the borders of the lesion. [place]view was injected with adequate lift       of the lesion from the muscularis propria. Snare mucosal resection with       suction (via the working channel) retrieval was performed. A 12 mm area       was resected. Resection and retrieval were complete. There was no       bleeding during and at the end of the procedure. To prevent bleeding  after mucosal resection, two hemostatic clips were successfully placed       (MR conditional). There was no bleeding during, or at the end, of the       procedure.      Six sessile polyps were found in the descending colon, transverse colon       and ascending colon. The polyps were 5 to 6 mm in size. These polyps       were removed with a cold snare. Resection and retrieval were complete.       Estimated blood loss: none.      Many diverticula were found in the sigmoid colon.      Normal mucosa was found in the entire colon. Biopsies for histology were       taken with a cold forceps from the entire colon for evaluation of       microscopic colitis.      The retroflexed view of the distal  rectum and anal verge was normal and       showed no anal or rectal abnormalities. Impression:            - The examined portion of the ileum was normal.                        - One 3 mm polyp in the cecum, removed with a cold                         snare. Resected and retrieved.                        - One 12 mm polyp in the cecum, removed with mucosal                         resection. Resected and retrieved. Clips (MR                         conditional) were placed.                        - Six 5 to 6 mm polyps in the descending colon, in the                         transverse colon and in the ascending colon, removed                         with a cold snare. Resected and retrieved.                        - Diverticulosis in the sigmoid colon.                        - Normal mucosa in the entire examined colon. Biopsied.                        - The distal rectum and anal verge are normal on                         retroflexion view.                        -  Mucosal resection was performed. Resection and                         retrieval were complete. Recommendation:        - Discharge patient to home (with escort).                        - Cardiac diet and diabetic (ADA) diet.                        - Continue present medications.                        - Await pathology results.                        - Resume Plavix (clopidogrel) in 5 days at prior dose.                         Refer to managing physician for further adjustment of                         therapy.                        - Repeat colonoscopy in 3 years for surveillance of                         multiple polyps.                        - Return to my office as previously scheduled. Procedure Code(s):     --- Professional ---                        [phone], Colonoscopy, flexible; with endoscopic mucosal                         resection                        [id number], 38, Colonoscopy, flexible; with removal of                          tumor(s), polyp(s), or other lesion(s) by snare                         technique                        45380, 60, Colonoscopy, flexible; with biopsy, single                         or multiple Diagnosis Code(s):     --- Professional ---                        K63.5, Polyp of colon                        K52.9, Noninfective gastroenteritis and colitis,  unspecified                        R19.7, Diarrhea, unspecified                        R19.5, Other fecal abnormalities                        K57.30, Diverticulosis of large intestine without                         perforation or abscess without bleeding CPT copyright 2019 American Medical Association. All rights reserved. The codes documented in this report are preliminary and upon coder review may  be revised to meet current compliance requirements. Dr. Ulyess Mort Lin Landsman MD, MD 09/10/2021 9:58:33 AM This report has been signed electronically. Number of Addenda: 0 Note Initiated On: 09/10/2021 8:49 AM Scope Withdrawal Time: 0 hours 37 minutes 3 seconds  Total Procedure Duration: 0 hours 43 minutes 6 seconds  Estimated Blood Loss:  Estimated blood loss: none.      [facility]

## 2021-09-10 NOTE — Transfer of Care (Signed)
Immediate Anesthesia Transfer of Care Note  Patient: Sarah White  Procedure(s) Performed: COLONOSCOPY WITH PROPOFOL ESOPHAGOGASTRODUODENOSCOPY (EGD)  Patient Location: PACU and Endoscopy Unit  Anesthesia Type:General  Level of Consciousness: drowsy  Airway & Oxygen Therapy: Patient Spontanous Breathing  Post-op Assessment: Report given to RN and Post -op Vital signs reviewed and stable  Post vital signs: Reviewed and stable  Last Vitals:  Vitals Value Taken Time  BP 138/58 09/10/21 0958  Temp    Pulse 75 09/10/21 1001  Resp 20 09/10/21 1001  SpO2 98 % 09/10/21 1001  Vitals shown include unvalidated device data.  Last Pain:  Vitals:   09/10/21 0957  TempSrc:   PainSc: 0-No pain         Complications: No notable events documented.

## 2021-09-11 ENCOUNTER — Encounter: Payer: Self-pay | Admitting: Gastroenterology

## 2021-09-11 LAB — SURGICAL PATHOLOGY

## 2021-09-12 ENCOUNTER — Encounter: Payer: Self-pay | Admitting: Gastroenterology

## 2021-10-09 ENCOUNTER — Telehealth: Payer: Self-pay

## 2021-10-09 NOTE — Chronic Care Management (AMB) (Signed)
Chronic Care Management Pharmacy Assistant   Name: Sarah White  MRN: [id number] DOB: Aug 08, 1946  Reason for Encounter: Disease State General    Recent office visits:  07/21/21-Megan Annia Friendly, DO (PCP) Seen for depression. Follow up in 3 months. 06/23/21-Megan Annia Friendly (PCP) General follow up visit. Labs ordered. Increase her seroquel to 100mg . Ambulatory referral to Gastroenterology. EKG ordered. Flu vaccine given. Follow up in 4 weeks.  Recent consult visits:  07/01/21-Rohini Raeanne Gathers, MD (Gastroenterology) General follow up visit. Follow up in 3-4 months.  06/25/21-Dwayne Aida Raider (Cardiology)   Hospital visits:  None in previous 6 months  Medications: Outpatient Encounter Medications as of 10/09/2021  Medication Sig Note   albuterol (ACCUNEB) 0.63 MG/3ML nebulizer solution Take 3 mLs (0.63 mg total) by nebulization every 6 (six) hours as needed for wheezing.    albuterol (VENTOLIN HFA) 108 (90 Base) MCG/ACT inhaler Inhale 2 puffs into the lungs every 6 (six) hours as needed for wheezing or shortness of breath. March 31, 2020: Taking 2 puffs BID   amiodarone (PACERONE) 200 MG tablet Take 0.5 tablets by mouth daily.    aspirin EC 81 MG tablet Take 81 mg by mouth at bedtime.     budesonide-formoterol (SYMBICORT) 160-4.5 MCG/ACT inhaler Inhale 2 puffs into the lungs 2 (two) times daily.    buPROPion (WELLBUTRIN XL) 150 MG 24 hr tablet Take by mouth.    carvedilol (COREG) 12.5 MG tablet Take 12.5 mg by mouth daily.    clopidogrel (PLAVIX) 75 MG tablet Take 1 tablet (75 mg total) by mouth daily at 6 (six) AM.    cyclobenzaprine (FLEXERIL) 10 MG tablet TAKE 1 TABLET BY MOUTH THREE TIMES DAILY AS NEEDED FOR MUSCLE SPASM. DO NOT DRIVE ON THIS MED. WILL MAKE YOU SLEEPY    diphenhydrAMINE (BENADRYL) 25 MG tablet Take 25 mg by mouth at bedtime as needed for allergies. Dies not take regularly 2020/03/31: Taking 1 QPM   donepezil (ARICEPT) 10 MG tablet TAKE 1 TABLET BY MOUTH NIGHTLY  FOR 30 DAYS    furosemide (LASIX) 40 MG tablet Take 1 tablet by mouth once daily    isosorbide mononitrate (IMDUR) 60 MG 24 hr tablet Take 1 tablet by mouth once daily    nitroGLYCERIN (NITROSTAT) 0.4 MG SL tablet DISSOLVE ONE TABLET UNDER THE TONGUE EVERY 5 MINUTES AS NEEDED FOR CHEST PAIN.  DO NOT EXCEED A TOTAL OF 3 DOSES IN 15 MINUTES    pantoprazole (PROTONIX) 40 MG tablet Take 1 tablet (40 mg total) by mouth 2 (two) times daily before a meal.    pantoprazole (PROTONIX) 40 MG tablet Take 1 tablet (40 mg total) by mouth 2 (two) times daily before a meal.    potassium chloride SA (KLOR-CON) 20 MEQ tablet Take 1 tablet (20 mEq total) by mouth daily.    QUEtiapine (SEROQUEL XR) 50 MG TB24 24 hr tablet Take 2 tablets (100 mg total) by mouth at bedtime.    rosuvastatin (CRESTOR) 20 MG tablet Take 1 tablet (20 mg total) by mouth daily.    sacubitril-valsartan (ENTRESTO) 49-51 MG Take 1 tablet by mouth 2 (two) times daily.     sertraline (ZOLOFT) 100 MG tablet Take 2 tablets (200 mg total) by mouth daily.    silver sulfADIAZINE (SILVADENE) 1 % cream Apply 1 application topically daily. (Patient taking differently: Apply 1 application topically as needed.)    tiotropium (SPIRIVA HANDIHALER) 18 MCG inhalation capsule PLACE 1 CAPSULE INTO INHALER AND INHALE DAILY    traZODone (  DESYREL) 50 MG tablet Take 1 tablet (50 mg total) by mouth at bedtime as needed for sleep.    triamcinolone ointment (KENALOG) 0.5 % Apply 1 application topically 2 (two) times daily.    No facility-administered encounter medications on file as of 10/09/2021.   Have you had any problems recently with your health? Patient states she has been doing well.   Have you had any problems with your pharmacy? Patient states she does not have any problems with her pharmacy.  What issues or side effects are you having with your medications? Patient states she has no issues or side effects with any of her medications.  What would you  like me to pass along to Edison Nasuti Potts,CPP for them to help you with?  Patient states she is requesting Symbicort PAP and I will go ahead and start on it for her & will mail it out tomorrow.   What can we do to take care of you better? Patient states there is nothing at this time.  Care Gaps: Zoster Vaccines- Shingrix:Never done OPHTHALMOLOGY EXAM:Last completed: Sep 17, 2015 COVID-19 Vaccine:Last completed: Sep 24, 2020 FOOT EXAM:Last completed: Jan 18, 2020  Star Rating Drugs: Rosuvastatin 20 mg Last filled:02/14/21 90 DS   Corrie Mckusick, [place]

## 2021-10-21 ENCOUNTER — Ambulatory Visit: Payer: Medicare Other | Admitting: Family Medicine

## 2021-10-23 ENCOUNTER — Ambulatory Visit (INDEPENDENT_AMBULATORY_CARE_PROVIDER_SITE_OTHER): Payer: Medicare Other | Admitting: Family Medicine

## 2021-10-23 ENCOUNTER — Other Ambulatory Visit: Payer: Self-pay

## 2021-10-23 ENCOUNTER — Encounter: Payer: Self-pay | Admitting: Family Medicine

## 2021-10-23 VITALS — BP 105/58 | HR 62 | Temp 98.4°F | Wt 150.0 lb

## 2021-10-23 DIAGNOSIS — I952 Hypotension due to drugs: Secondary | ICD-10-CM | POA: Diagnosis not present

## 2021-10-23 DIAGNOSIS — G8929 Other chronic pain: Secondary | ICD-10-CM

## 2021-10-23 DIAGNOSIS — F321 Major depressive disorder, single episode, moderate: Secondary | ICD-10-CM | POA: Diagnosis not present

## 2021-10-23 DIAGNOSIS — M25512 Pain in left shoulder: Secondary | ICD-10-CM | POA: Diagnosis not present

## 2021-10-23 MED ORDER — ISOSORBIDE MONONITRATE ER 30 MG PO TB24: 30.0000 mg | ORAL_TABLET | Freq: Every day | ORAL | 3 refills | Status: DC

## 2021-10-23 MED ORDER — BUDESONIDE-FORMOTEROL FUMARATE 160-4.5 MCG/ACT IN AERO: 2.0000 | INHALATION_SPRAY | Freq: Two times a day (BID) | RESPIRATORY_TRACT | 3 refills | Status: DC

## 2021-10-23 MED ORDER — FUROSEMIDE 40 MG PO TABS: 40.0000 mg | ORAL_TABLET | Freq: Every day | ORAL | 1 refills | Status: DC

## 2021-10-23 NOTE — Progress Notes (Signed)
BP (!) 105/58    Pulse 62    Temp 98.4 F (36.9 C)    Wt 150 lb (68 kg)    SpO2 95%    BMI 25.75 kg/m    Subjective:    [name], female    DOB: Aug 09, 1946, 76 y.o.   MRN: [id number]  HPI: Sarah White is a 76 y.o. female  Chief Complaint  Patient presents with   Depression   Shoulder Pain    Patient states her left shoulder has been hurting for a few months. Hurts more with certain movements.    DEPRESSION Mood status: controlled Satisfied with current treatment?: yes Symptom severity: mild  Duration of current treatment : chronic Side effects: no Medication compliance: excellent compliance Psychotherapy/counseling: no  Depressed mood: yes Anxious mood: no Anhedonia: no Significant weight loss or gain: no Insomnia: no  Fatigue: yes Feelings of worthlessness or guilt: no Impaired concentration/indecisiveness: no Suicidal ideations: no Hopelessness: no Crying spells: no Depression screen [facility] 2/9 10/23/2021 10/23/2021 07/21/2021 06/23/2021 04/25/2021  Decreased Interest 1 1 2 3  0  Down, Depressed, Hopeless 1 1 2 3  0  PHQ - 2 Score 2 2 4 6  0  Altered sleeping 0 1 1 3  -  Tired, decreased energy 1 3 1 3  -  Change in appetite 0 0 1 1 -  Feeling bad or failure about yourself  1 0 1 3 -  Trouble concentrating 0 0 1 1 -  Moving slowly or fidgety/restless 0 0 1 0 -  Suicidal thoughts 0 0 0 0 -  PHQ-9 Score 4 6 10 17  -  Difficult doing work/chores - - - Somewhat difficult -  Some recent data might be hidden   HYPERTENSION Hypertension status: controlled  Satisfied with current treatment? yes Duration of hypertension: chronic BP monitoring frequency:  not checking BP medication side effects:  no Medication compliance: excellent compliance Aspirin: yes Recurrent headaches: no Visual changes: no Palpitations: no Dyspnea: no Chest pain: no Lower extremity edema: no Dizzy/lightheaded: no  SHOULDER PAIN Duration: chronic, worse in the past 2  months Involved shoulder: left Mechanism of injury: unknown Location: diffuse Onset:gradual Severity: moderate  Quality:  sharp Frequency: constant Radiation: no Aggravating factors: lifting and movement  Alleviating factors: aspercream and lidocaine  Status: worse Treatments attempted: aspercream, lidocaine  Relief with NSAIDs?:  no Weakness: no Numbness: no Decreased grip strength: no Redness: no Swelling: no Bruising: no Fevers: no  Relevant past medical, surgical, family and social history reviewed and updated as indicated. Interim medical history since our last visit reviewed. Allergies and medications reviewed and updated.  Review of Systems  Constitutional: Negative.   Respiratory: Negative.    Cardiovascular: Negative.   Gastrointestinal: Negative.   Musculoskeletal:  Positive for arthralgias and myalgias. Negative for back pain, gait problem, joint swelling, neck pain and neck stiffness.  Skin: Negative.   Neurological: Negative.   Psychiatric/Behavioral: Negative.     Per HPI unless specifically indicated above     Objective:    BP (!) 105/58    Pulse 62    Temp 98.4 F (36.9 C)    Wt 150 lb (68 kg)    SpO2 95%    BMI 25.75 kg/m   Wt Readings from Last 3 Encounters:  10/23/21 150 lb (68 kg)  09/10/21 144 lb (65.3 kg)  07/21/21 155 lb 3.2 oz (70.4 kg)    Physical Exam Vitals and nursing note reviewed.  Constitutional:  General: She is not in acute distress.    Appearance: Normal appearance. She is not ill-appearing, toxic-appearing or diaphoretic.  HENT:     Head: Normocephalic and atraumatic.     Right Ear: External ear normal.     Left Ear: External ear normal.     Nose: Nose normal.     Mouth/Throat:     Mouth: Mucous membranes are moist.     Pharynx: Oropharynx is clear.  Eyes:     General: No scleral icterus.       Right eye: No discharge.        Left eye: No discharge.     Extraocular Movements: Extraocular movements intact.      Conjunctiva/sclera: Conjunctivae normal.     Pupils: Pupils are equal, round, and reactive to light.  Cardiovascular:     Rate and Rhythm: Normal rate and regular rhythm.     Pulses: Normal pulses.     Heart sounds: Normal heart sounds. No murmur heard.   No friction rub. No gallop.  Pulmonary:     Effort: Pulmonary effort is normal. No respiratory distress.     Breath sounds: Normal breath sounds. No stridor. No wheezing, rhonchi or rales.  Chest:     Chest wall: No tenderness.  Musculoskeletal:        General: Normal range of motion.     Cervical back: Normal range of motion and neck supple.  Skin:    General: Skin is warm and dry.     Capillary Refill: Capillary refill takes less than 2 seconds.     Coloration: Skin is not jaundiced or pale.     Findings: No bruising, erythema, lesion or rash.  Neurological:     General: No focal deficit present.     Mental Status: She is alert and oriented to person, place, and time. Mental status is at baseline.  Psychiatric:        Mood and Affect: Mood normal.        Behavior: Behavior normal.        Thought Content: Thought content normal.        Judgment: Judgment normal.    Results for orders placed or performed during the hospital encounter of 09/10/21  Glucose, capillary  Result Value Ref Range   Glucose-Capillary 83 70 - 99 mg/dL  Surgical pathology  Result Value Ref Range   SURGICAL PATHOLOGY      SURGICAL PATHOLOGY CASE: [id number] PATIENT: Sarah White Surgical Pathology Report     Specimen Submitted: A. Duodenum; cbx B. Stomach; cbx C. Colon polyp x2, cecum; hot(1) cold(1) snare D. Colon, random; cbx E. Colon polyp x2, ascending; cold snare F. Colon polyp x3, tran; hot(1) cold(2) snare G. Colon polyp x2, descending; cold snare  Clinical History: Esophageal dysphagia R13.19 chronic diarrhea of unknown origin K52.9.  Gastric ulcer; gastric erosions, colon polyps, diverticulosis      DIAGNOSIS: A.  DUODENUM; COLD BIOPSY: - DUODENAL MUCOSA WITH NO SIGNIFICANT PATHOLOGIC ALTERATION. - NEGATIVE FOR FEATURES OF CELIAC DISEASE. - NEGATIVE FOR INTESTINAL METAPLASIA, DYSPLASIA, AND MALIGNANCY.  B. STOMACH; COLD BIOPSY: - MILD CHRONIC GASTRITIS. - NEGATIVE FOR ACTIVE INFLAMMATION AND H PYLORI. - NEGATIVE FOR INTESTINAL METAPLASIA, DYSPLASIA, AND MALIGNANCY.  C. COLON POLYP X2, CECUM; HOT AND COLD SNARE: - TUBULAR ADENOMA, MULTIPLE FRAGMENTS. - NEG ATIVE FOR HIGH GRADE DYSPLASIA AND MALIGNANCY.  D. COLON, RANDOM; COLD BIOPSY: - COLONIC MUCOSA WITH NO SIGNIFICANT PATHOLOGIC ALTERATION. - NEGATIVE FOR ACTIVE INFLAMMATION AND FEATURES OF CHRONICITY. -  NEGATIVE FOR MICROSCOPIC COLITIS, DYSPLASIA, AND MALIGNANCY.  E. COLON POLYP X2, ASCENDING; COLD SNARE: - TUBULAR ADENOMA, MULTIPLE FRAGMENTS. - NEGATIVE FOR HIGH GRADE DYSPLASIA AND MALIGNANCY.  F. COLON POLYP X3, TRANSVERSE; HOT AND COLD SNARE: - TUBULAR ADENOMA, MULTIPLE FRAGMENTS. - NEGATIVE FOR HIGH GRADE DYSPLASIA AND MALIGNANCY.  G. COLON POLYP X2, DESCENDING; COLD SNARE: - TUBULAR ADENOMA, MULTIPLE FRAGMENTS. - NEGATIVE FOR HIGH GRADE DYSPLASIA AND MALIGNANCY.   GROSS DESCRIPTION: A. Labeled: cbx duodenum rule out celiac Received: Formalin Collection time: 9:02 AM on 09/10/2021 Placed into formalin time: 9:02 AM on 09/10/2021 Tissue fragment(s): 4 Size: Aggregate, 0.8 x 0.5 x 0.2 cm Description: Tan-pink soft tissue fragments Entirely submitted in 1 ca ssette.  B. Labeled: Gastric cbx rule out H. pylori Received: Formalin Collection time: 9:02 AM on 09/10/2021 Placed into formalin time: 9:02 AM on 09/10/2021 Tissue fragment(s): 1 Size: 0.5 x 0.3 x 0.2 cm Description: Tan soft tissue fragment Entirely submitted in 1 cassette.  C. Labeled: Hot snare x1/cold snare x1 cecum polyp Received: Formalin Collection time: 9:24 AM on 09/10/2021 Placed into formalin time: 9:24 AM on 09/10/2021 Tissue fragment(s): Multiple Size:  Aggregate, 1.7 x 0.8 x 0.2 cm Description: Tan-pink soft tissue fragments Entirely submitted in 1 cassette.  D. Labeled: Random colon cbx Received: Formalin Collection time: 9:31 AM on 09/10/2021 Placed into formalin time: 9:31 AM on 09/10/2021 Tissue fragment(s): Multiple Size: Aggregate, 2.2 x 0.6 x 0.2 cm Description: Received are fragments of tan-pink soft tissue admixed with intestinal debris.  The ratio of soft tissue to intestinal debris is 90: 10. Entirely submitted in 1 cassette.  E. Label ed: Cold snare ascending colon polyp x2 Received: Formalin Collection time: 9:36 AM on 09/10/2021 Placed into formalin time: 9:36 AM on 09/10/2021 Tissue fragment(s): Multiple Size: Aggregate, 0.8 x 0.7 x 0.5 cm Description: Received are fragments of tan-pink soft tissue admixed with intestinal debris.  The ratio of soft tissue to intestinal debris is 90: 10.  The largest soft tissue fragment has a resection margin which is inked blue.  This fragment is trisected. Entirely submitted in cassettes 1-2 with the trisected fragment in cassette 1 and the remaining fragments in cassette 2.  F. Labeled: Hot snare x1/cold snare x2 transverse colon polyp Received: Formalin Collection time: 9:41 AM on 09/10/2021 Placed into formalin time: 9:41 AM on 09/10/2021 Tissue fragment(s): Multiple Size: Aggregate, 1.4 x 0.9 x 0.2 cm Description: Tan-pink soft tissue fragments Entirely submitted in 1 cassette.  G. Labeled: Cold snare descending colon polyp x2 Received: Formalin Collecti on time: 9:43 AM on 09/10/2021 Placed into formalin time: 9:43 AM on 09/10/2021 Tissue fragment(s): Multiple Size: Aggregate, 1.7 x 0.5 x 0.2 cm Description: Received are fragments of tan-pink soft tissue admixed with intestinal debris.  The ratio of soft tissue to intestinal debris is 95: 5. Entirely submitted in 1 cassette.  RB 09/10/2021  Final Diagnosis performed by Betsy Pries, MD.   Electronically  signed 09/11/2021 12:00:42PM The electronic signature indicates that the named Attending Pathologist has evaluated the specimen Technical component performed at [facility], [address] [place], [place] [address] Lab: [phone] Dir: Rush Farmer, MD, MMM  Professional component performed at [facility], [facility], [place], [place], [place] [address] Lab: [phone] Dir: Kathi Simpers, MD       Assessment & Plan:   Problem List Items Addressed This Visit       Other   Depression, major, single episode, moderate ([place])    Under good control  on current regimen. Continue current regimen. Continue to monitor. Call with any concerns. Refills given. Labs drawn today.       Other Visit Diagnoses     Hypotension due to drugs    -  Primary   Will cut her imdur to 30mg  and recheck 1 month. Call with any concerns.    Relevant Medications   isosorbide mononitrate (IMDUR) 30 MG 24 hr tablet   furosemide (LASIX) 40 MG tablet   Chronic left shoulder pain       Referral to ortho made today. Call with any concerns. Continue to monitor.    Relevant Orders   Ambulatory referral to Orthopedic Surgery        Follow up plan: Return 4-6 weeks for follow up BP.

## 2021-10-27 NOTE — Assessment & Plan Note (Signed)
Under good control on current regimen. Continue current regimen. Continue to monitor. Call with any concerns. Refills given. Labs drawn today.   

## 2021-10-28 DIAGNOSIS — M25512 Pain in left shoulder: Secondary | ICD-10-CM | POA: Diagnosis not present

## 2021-10-29 ENCOUNTER — Ambulatory Visit: Payer: Medicare Other | Admitting: Gastroenterology

## 2021-10-29 ENCOUNTER — Other Ambulatory Visit: Payer: Self-pay

## 2021-10-30 ENCOUNTER — Telehealth: Payer: Self-pay

## 2021-10-30 NOTE — Chronic Care Management (AMB) (Signed)
? ? ?Chronic Care Management ?Pharmacy Assistant  ? ?Name: Sarah White  MRN: [id number] DOB: Jan 02, 1946 ? ?Reason for Encounter: Patient assistance application Symbicort ?  ? ?Medications: ?Outpatient Encounter Medications as of 10/30/2021  ?Medication Sig Note  ? albuterol (ACCUNEB) 0.63 MG/3ML nebulizer solution Take 3 mLs (0.63 mg total) by nebulization every 6 (six) hours as needed for wheezing.   ? albuterol (VENTOLIN HFA) 108 (90 Base) MCG/ACT inhaler Inhale 2 puffs into the lungs every 6 (six) hours as needed for wheezing or shortness of breath. 04-10-20: Taking 2 puffs BID  ? amiodarone (PACERONE) 200 MG tablet Take 0.5 tablets by mouth daily.   ? aspirin EC 81 MG tablet Take 81 mg by mouth at bedtime.    ? budesonide-formoterol (SYMBICORT) 160-4.5 MCG/ACT inhaler Inhale 2 puffs into the lungs 2 (two) times daily.   ? buPROPion (WELLBUTRIN XL) 150 MG 24 hr tablet Take by mouth.   ? carvedilol (COREG) 12.5 MG tablet Take 12.5 mg by mouth daily.   ? clopidogrel (PLAVIX) 75 MG tablet Take 1 tablet (75 mg total) by mouth daily at 6 (six) AM.   ? cyclobenzaprine (FLEXERIL) 10 MG tablet TAKE 1 TABLET BY MOUTH THREE TIMES DAILY AS NEEDED FOR MUSCLE SPASM. DO NOT DRIVE ON THIS MED. WILL MAKE YOU SLEEPY   ? diphenhydrAMINE (BENADRYL) 25 MG tablet Take 25 mg by mouth at bedtime as needed for allergies. Dies not take regularly 04-10-2020: Taking 1 QPM  ? donepezil (ARICEPT) 10 MG tablet TAKE 1 TABLET BY MOUTH NIGHTLY FOR 30 DAYS   ? furosemide (LASIX) 40 MG tablet Take 1 tablet (40 mg total) by mouth daily.   ? isosorbide mononitrate (IMDUR) 30 MG 24 hr tablet Take 1 tablet (30 mg total) by mouth daily.   ? nitroGLYCERIN (NITROSTAT) 0.4 MG SL tablet DISSOLVE ONE TABLET UNDER THE TONGUE EVERY 5 MINUTES AS NEEDED FOR CHEST PAIN.  DO NOT EXCEED A TOTAL OF 3 DOSES IN 15 MINUTES   ? pantoprazole (PROTONIX) 40 MG tablet Take 1 tablet (40 mg total) by mouth 2 (two) times daily before a meal.   ? QUEtiapine (SEROQUEL XR) 50 MG  TB24 24 hr tablet Take 2 tablets (100 mg total) by mouth at bedtime.   ? rosuvastatin (CRESTOR) 20 MG tablet Take 1 tablet (20 mg total) by mouth daily.   ? sacubitril-valsartan (ENTRESTO) 49-51 MG Take 1 tablet by mouth 2 (two) times daily.    ? sertraline (ZOLOFT) 100 MG tablet Take 2 tablets (200 mg total) by mouth daily.   ? silver sulfADIAZINE (SILVADENE) 1 % cream Apply 1 application topically daily. (Patient taking differently: Apply 1 application topically as needed.)   ? tiotropium (SPIRIVA HANDIHALER) 18 MCG inhalation capsule PLACE 1 CAPSULE INTO INHALER AND INHALE DAILY   ? traZODone (DESYREL) 50 MG tablet Take 1 tablet (50 mg total) by mouth at bedtime as needed for sleep.   ? triamcinolone ointment (KENALOG) 0.5 % Apply 1 application topically 2 (two) times daily.   ? ?No facility-administered encounter medications on file as of 10/30/2021.  ? ? ?Prefilled and mailed patient assistance application. I contacted the patient to complete highlighted areas of the forms sign and date. I explained she will also need to bring it into the office once she completed her section so that her provider can sign and date as well and we can fax it to the manufacture after. Patient understood and is aware of directions. ? ?Corrie Mckusick, RMA ?Health Concierge ? ?

## 2021-11-10 ENCOUNTER — Telehealth: Payer: Self-pay | Admitting: Family Medicine

## 2021-11-10 NOTE — Telephone Encounter (Signed)
Mr. Norkus dropped off AZ&ME application to be completed and sent in by provider. Paperwork was placed in the provider's folder. ?

## 2021-11-10 NOTE — Telephone Encounter (Signed)
Paperwork was placed in provider's folder. Spoke with Sarah White to see if there was a deadline for the patient's application. Informed that he would check on this and get back with me as, Dr.Johnson is out of the office for the week.  ?

## 2021-11-17 ENCOUNTER — Ambulatory Visit (INDEPENDENT_AMBULATORY_CARE_PROVIDER_SITE_OTHER): Payer: Medicare Other

## 2021-11-17 DIAGNOSIS — J449 Chronic obstructive pulmonary disease, unspecified: Secondary | ICD-10-CM

## 2021-11-17 DIAGNOSIS — E782 Mixed hyperlipidemia: Secondary | ICD-10-CM

## 2021-11-17 DIAGNOSIS — I10 Essential (primary) hypertension: Secondary | ICD-10-CM

## 2021-11-17 NOTE — Patient Instructions (Addendum)
Ms. Wessells, ? ?Thank you for talking with me today. I have included our care plan/goals in the following pages.  ? ?Please review and call me at [phone] with any questions. ? ?Thanks! ?Edison Nasuti  ? ?Madelin Rear, PharmD ?Clinical Pharmacist  ?[phone] ? ?Care Plan : [place]  ?Updates made by Madelin Rear, [facility] since 11/17/2021 12:00 AM  ?  ? ?Problem: DM, CAD, HF, COPD, tobacco use, HLD,   ?Priority: High  ?  ? ?Long-Range Goal: Disease Management   ?Start Date: 12/13/2020  ?Recent Progress: On track  ?Priority: High  ?Note:   ?Pharmacist Clinical Goal(s):  ?Patient will verbalize ability to afford treatment regimen ?achieve adherence to monitoring guidelines and medication adherence to achieve therapeutic efficacy through collaboration with PharmD and provider.  ? ?Interventions: ?1:1 collaboration with Valerie Roys, DO regarding development and update of comprehensive plan of care as evidenced by provider attestation and co-signature ?Inter-disciplinary care team collaboration (see longitudinal plan of care) ?Comprehensive medication review performed; medication list updated in electronic medical record ? ?Hypertension/Heart Failure (BP goal <130/80) ?-Not ideally controlled ?GFR 60s, last EF 35-40%. N ?-ot entirely consistent with daily weights but reports consistency with furosemide and BP/HF meds. Most recent adjustment imdur '60mg'$ ->'30mg'$  - low Bps but fluctuating  ?-recent lows leading to imdur reduction  ?-Current treatment: ?Entresto 49-'51mg'$  once daily  ?Imdur 30 mg daily  ?Carvedilol 12.5 mg BID - moved down to 12.5 mg in AM ?Furosemide 40 mg once daily  ?Amiodarone 100 mg daily  ?-Medications previously tried: amlodipine, lisinopril ?-Current home readings: varies, none specific provided ?-Current dietary habits: low salt ?-Current exercise habits: no formal exercise ?-Reports hypotensive/hypertensive symptoms ?-Educated on BP goals and benefits of medications for prevention of heart  attack, stroke and kidney damage; ?Importance of home blood pressure monitoring; ?-Counseled to monitor BP at home 1-2x/wk, daily weights upon wakening, document, and provide log at future appointments ?-Counseled on diet and exercise extensively ?Recommended to continue current medication ? ?Hyperlipidemia: (LDL goal < 70) ?-Not ideally controlled ?-Secondary prevention. DM, CAD - MI,unstable angina ?-Current treatment: ?Crestor 20 mg once daily ?Reviewed for side effects, tolerating well  ?-Educated on Cholesterol goals;  ?Benefits of statin for ASCVD risk reduction; ?-Recommended to continue current medication ? ?COPD (Goal: control symptoms and prevent exacerbations) ?-Controlled ?-Current treatment  ?Symbicort  ?Spiriva ?-Medications previously tried: n/a  ?-Gold Grade:  ?-Current COPD Classification:  A (low sx, <2 exacerbations/yr) ?-MMRC/CAT score: 0 ?-Pulmonary function testing: ? ?-Exacerbations requiring treatment in last 6 months: 0 ?-Patient reports consistent use of maintenance inhaler ?-Frequency of rescue inhaler use: infrequent ?-Counseled on Proper inhaler technique; ?Benefits of consistent maintenance inhaler use ?When to use rescue inhaler ?-Counseled on diet and exercise extensively ?Recommended to continue current medication ?Assessed patient finances. Will send breztri pap. Already has spiriva on hand + symbicort dropped off last week ?  ? ? ?The patient verbalized understanding of instructions provided today and agreed to receive a MyChart copy of patient instruction and/or educational materials. ?Telephone follow up appointment with pharmacy team member scheduled for: See next appointment with "Care Management Staff" under "What's Next" below.   ?

## 2021-11-17 NOTE — Progress Notes (Signed)
? ?Chronic Care Management ?Pharmacy Note ? ?11/17/2021 ?Name:  Sarah White MRN:  [id number] DOB:  08-21-1946 ? ?Summary: ?Pt has Spiriva on hand, symbicort PAP dropped off by pt and prepped for MD signature ?Can also send out PAP for breztri if wanting to switch from symbicort + spiriva -> Breztri  ? ?Subjective: ?Sarah White is an 76 y.o. year old female who is a primary patient of Valerie Roys, DO.  The CCM team was consulted for assistance with disease management and care coordination needs.   ? ?Engaged with patient by telephone for follow up visit in response to provider referral for pharmacy case management and/or care coordination services.  ? ?Consent to Services:  ?The patient was given information about Chronic Care Management services, agreed to services, and gave verbal consent prior to initiation of services.  Please see initial visit note for detailed documentation.  ? ?Patient Care Team: ?Valerie Roys, [name] ?Yolonda Kida, MD as Consulting Physician (Cardiology) ?Yolonda Kida, MD as Consulting Physician (Cardiology) ?Vanita Ingles, RN as Case Manager (General Practice) ?Madelin Rear, [facility] as Pharmacist (General Practice) ? ?Hospital visits: ?None in previous 6 months ? ?Objective: ? ?Lab Results  ?Component Value Date  ? CREATININE 0.99 06/23/2021  ? CREATININE 0.82 09/24/2020  ? CREATININE 0.83 08/19/2020  ? ? ?Lab Results  ?Component Value Date  ? HGBA1C 5.1 06/23/2021  ? HGBA1C 5.1 08/07/2020  ? HGBA1C 5.6 04/18/2020  ? ?Last diabetic Eye exam:  ?Lab Results  ?Component Value Date/Time  ? HMDIABEYEEXA No Retinopathy 09/10/2015 01:16 PM  ?  ?Last diabetic Foot exam: No results found for: HMDIABFOOTEX  ? ?   ?Component Value Date/Time  ? CHOL 190 06/23/2021 1518  ? CHOL 142 04/18/2020 1623  ? CHOL 135 12/18/2019 1504  ? CHOL 164 02/04/2016 1043  ? CHOL 258 (H) 01/16/2014 1200  ? TRIG 243 (H) 06/23/2021 1518  ? TRIG 152 (H) 04/18/2020 1623  ?  TRIG 138 12/18/2019 1504  ? TRIG 258 (H) 02/04/2016 1043  ? TRIG 337 (H) 01/16/2014 1200  ? HDL 54 06/23/2021 1518  ? HDL 41 04/18/2020 1623  ? HDL 42 12/18/2019 1504  ? HDL 35 (L) 01/16/2014 1200  ? CHOLHDL 4.3 11/26/2016 0810  ? VLDL 51 (H) 11/26/2016 0810  ? VLDL 52 (H) 02/04/2016 1043  ? VLDL 67 (H) 01/16/2014 1200  ? [place] 95 06/23/2021 1518  ? [place] 75 04/18/2020 1623  ? [place] 69 12/18/2019 1504  ? LDLCALC 156 (H) 01/16/2014 1200  ? ? ?Hepatic Function Latest Ref Rng & Units 06/23/2021 09/24/2020 08/07/2020  ?Total Protein 6.0 - 8.5 g/dL 6.1 6.2 6.4  ?Albumin 3.7 - 4.7 g/dL 4.2 4.3 4.3  ?AST 0 - 40 IU/L 18 20 125(H)  ?ALT 0 - 32 IU/L 18 13 96(H)  ?Alk Phosphatase 44 - 121 IU/L 98 101 138(H)  ?Total Bilirubin 0.0 - 1.2 mg/dL 0.2 <0.2 0.5  ?Bilirubin, Direct 0.0 - 0.2 mg/dL - - -  ? ? ?Lab Results  ?Component Value Date/Time  ? TSH 2.940 06/23/2021 03:18 PM  ? TSH 2.350 03/24/2018 10:43 AM  ? ? ?CBC Latest Ref Rng & Units 06/23/2021 09/24/2020 08/07/2020  ?WBC 3.4 - 10.8 x10E3/uL 6.8 4.6 6.2  ?Hemoglobin 11.1 - 15.9 g/dL 14.0 13.8 13.7  ?Hematocrit 34.0 - 46.6 % 41.9 41.7 41.9  ?Platelets 150 - 450 x10E3/uL 146(L) 112(L) 121(L)  ? ?Cardiac Catheterization: (12/2019) ?Narrative ?? Prox  LAD lesion is 40% stenosed.  ?? Prox Cx lesion is 75% stenosed.  ?? Prox Cx to Mid Cx lesion is 100% stenosed.  ?? Lat 1st Mrg lesion is 100% stenosed.  ?? Prox RCA lesion is 100% stenosed.  ?? 1st Diag lesion is 50% stenosed.  ?? Mid LAD lesion is 50% stenosed.  ?? Dist RCA lesion is 100% stenosed with 100% stenosed side branch in RPAV.   ?Moderate to severely depressed overall left ventricular function of around  ?35 to 40%  ? ?No results found for: VD25OH ? ?Clinical ASCVD:  ?The ASCVD Risk score (Arnett DK, et al., 2019) failed to calculate for the following reasons: ?  The patient has a prior MI or stroke diagnosis   ?Social History  ? ?Tobacco Use  ?Smoking Status Every Day  ? Packs/day: 1.00  ? Types: Cigarettes  ?Smokeless  Tobacco Never  ? ?BP Readings from Last 3 Encounters:  ?10/23/21 (!) 105/58  ?09/10/21 (!) 177/72  ?07/21/21 119/71  ? ?Pulse Readings from Last 3 Encounters:  ?10/23/21 62  ?09/10/21 68  ?07/21/21 66  ? ?Wt Readings from Last 3 Encounters:  ?10/23/21 150 lb (68 kg)  ?09/10/21 144 lb (65.3 kg)  ?07/21/21 155 lb 3.2 oz (70.4 kg)  ? ?BMI Readings from Last 3 Encounters:  ?10/23/21 25.75 kg/m?  ?09/10/21 24.72 kg/m?  ?07/21/21 27.49 kg/m?  ? ? ?Assessment: Review of patient past medical history, allergies, medications, health status, including review of consultants reports, laboratory and other test data, was performed as part of comprehensive evaluation and provision of chronic care management services.  ? ?SDOH:  (Social Determinants of Health) assessments and interventions performed: Yes ? ? ?[place] ? ?Allergies  ?Allergen Reactions  ? Amlodipine Swelling  ? Atorvastatin Other (See Comments)  ?  myalgia  ? Codeine Nausea And Vomiting  ? Hctz [Hydrochlorothiazide] Other (See Comments)  ?  Hypokalemia  ? Lisinopril Other (See Comments)  ?  Dizziness ?  ? Metformin And Related Nausea Only  ?  Nausea, dizziness  ? ? ?Medications Reviewed Today   ? ? Reviewed by Madelin Rear, [facility] (Pharmacist) on 11/17/21 at 1614  Med List Status: <None>  ? ?Medication Order Taking? Sig Documenting Provider Last Dose Status Informant  ?albuterol (ACCUNEB) 0.63 MG/3ML nebulizer solution [id number] No Take 3 mLs (0.63 mg total) by nebulization every 6 (six) hours as needed for wheezing. Valerie Roys, DO Taking Active   ?albuterol (VENTOLIN HFA) 108 (90 Base) MCG/ACT inhaler [id number] No Inhale 2 puffs into the lungs every 6 (six) hours as needed for wheezing or shortness of breath. Valerie Roys, DO Taking Active   ?         ?Med Note Darnelle Maffucci, CATHERINE E   Tue Mar 26, 2020  1:59 PM) Taking 2 puffs BID  ?amiodarone (PACERONE) 200 MG tablet [id number] No Take 0.5 tablets by mouth daily. [provider] Taking Active    ?aspirin EC 81 MG tablet [id number] No Take 81 mg by mouth at bedtime.  [provider] Taking Active Self  ?budesonide-formoterol (SYMBICORT) 160-4.5 MCG/ACT inhaler [id number]  Inhale 2 puffs into the lungs 2 (two) times daily. [place] P, DO  Active   ?buPROPion (WELLBUTRIN XL) 150 MG 24 hr tablet [id number] No Take by mouth. [provider] Taking Active   ?carvedilol (COREG) 12.5 MG tablet [id number] No Take 12.5 mg by mouth daily. [provider] Taking Active   ?clopidogrel (PLAVIX) 75 MG tablet [id number] No  Take 1 tablet (75 mg total) by mouth daily at 6 (six) AM. Valerie Roys, DO Taking Active   ?cyclobenzaprine (FLEXERIL) 10 MG tablet [id number] No TAKE 1 TABLET BY MOUTH THREE TIMES DAILY AS NEEDED FOR MUSCLE SPASM. DO NOT DRIVE ON THIS MED. WILL MAKE YOU SLEEPY Johnson, [name], DO Taking Active   ?diphenhydrAMINE (BENADRYL) 25 MG tablet [id number] No Take 25 mg by mouth at bedtime as needed for allergies. Dies not take regularly [provider] Taking Active Self  ?         ?Med Note ([place] Mar 26, 2020  2:01 PM) Taking 1 QPM  ?donepezil (ARICEPT) 10 MG tablet [id number] No TAKE 1 TABLET BY MOUTH NIGHTLY FOR 30 DAYS [provider] Taking Active   ?furosemide (LASIX) 40 MG tablet [id number]  Take 1 tablet (40 mg total) by mouth daily. Johnson, [name], DO  Active   ?isosorbide mononitrate (IMDUR) 30 MG 24 hr tablet [id number]  Take 1 tablet (30 mg total) by mouth daily. Johnson, [name], DO  Active   ?nitroGLYCERIN (NITROSTAT) 0.4 MG SL tablet [id number] No DISSOLVE ONE TABLET UNDER THE TONGUE EVERY 5 MINUTES AS NEEDED FOR CHEST PAIN.  DO NOT EXCEED A TOTAL OF 3 DOSES IN 15 MINUTES McElwee, Lauren A, NP Taking Active   ?pantoprazole (PROTONIX) 40 MG tablet [id number] No Take 1 tablet (40 mg total) by mouth 2 (two) times daily before a meal. Vanga, Tally Due, MD Taking Active   ?QUEtiapine (SEROQUEL XR) 50 MG TB24 24 hr tablet [id number]  No Take 2 tablets (100 mg total) by mouth at bedtime. [place] P, DO Taking Active   ?rosuvastatin (CRESTOR) 20 MG tablet [id number] No Take 1 tablet (20 mg total) by mouth daily. Johnson, [name], DO

## 2021-11-19 ENCOUNTER — Other Ambulatory Visit: Payer: Self-pay | Admitting: Family Medicine

## 2021-11-21 NOTE — Telephone Encounter (Signed)
Requested medication (s) are due for refill today: yes ? ?Requested medication (s) are on the active medication list: yes ? ?Last refill:  08/19/21 #30 ? ?Future visit scheduled: yes ? ?Notes to clinic:  Please review for refill. Refill not delegated per protocol ? ? ? ?Requested Prescriptions  ?Pending Prescriptions Disp Refills  ? cyclobenzaprine (FLEXERIL) 10 MG tablet [Pharmacy Med Name: Cyclobenzaprine HCl 10 MG Oral Tablet] 30 tablet 0  ?  Sig: TAKE 1 TABLET BY MOUTH THREE TIMES DAILY AS NEEDED FOR MUSCLE SPASM. DO NOT DRIVE WHILE ON THE MED. WILL MAKE YOU SLEEPY  ?  ? Not Delegated - Analgesics:  Muscle Relaxants Failed - 11/19/2021  5:26 PM  ?  ?  Failed - This refill cannot be delegated  ?  ?  Passed - Valid encounter within last 6 months  ?  Recent Outpatient Visits   ? ?      ? 4 weeks ago Hypotension due to drugs  ? [place] P, DO  ? 4 months ago Depression, major, single episode, moderate ([place])  ? [place], [name], DO  ? 5 months ago Essential (primary) hypertension  ? [place], [name], DO  ? 1 year ago Diarrhea of presumed infectious origin  ? [place], [name], DO  ? 1 year ago Diarrhea of presumed infectious origin  ? [place] P, DO  ? ?  ?  ?Future Appointments   ? ?        ? In 4 days Valerie Roys, DO [place], [facility]  ? In 2 months Vanga, Tally Due, MD [place]  ? In 5 months  [place], [facility]  ? ?  ? ?  ?  ?  ? ?

## 2021-11-25 ENCOUNTER — Other Ambulatory Visit: Payer: Self-pay

## 2021-11-25 ENCOUNTER — Encounter: Payer: Self-pay | Admitting: Family Medicine

## 2021-11-25 ENCOUNTER — Ambulatory Visit (INDEPENDENT_AMBULATORY_CARE_PROVIDER_SITE_OTHER): Payer: Medicare Other | Admitting: Family Medicine

## 2021-11-25 VITALS — BP 123/75 | HR 72 | Temp 98.2°F | Wt 161.2 lb

## 2021-11-25 DIAGNOSIS — I951 Orthostatic hypotension: Secondary | ICD-10-CM | POA: Diagnosis not present

## 2021-11-25 NOTE — Assessment & Plan Note (Signed)
Improved today but not on her imdur- will make sure she's taking only '30mg'$  on her imdur and recheck 3-4 weeks. Call with any concerns.  ?

## 2021-11-25 NOTE — Patient Instructions (Addendum)
TAKE 1/2 of your Imdur ('30mg'$ ) daily until you run out of what you have at home. '30mg'$  is waiting for you at the pharmacy when you're done with the '60mg'$  Rx. ? ?I'll see you in about 3-4 weeks. ?

## 2021-11-25 NOTE — Progress Notes (Signed)
? ?BP 123/75   Pulse 72   Temp 98.2 ?F (36.8 ?C)   Wt 161 lb 3.2 oz (73.1 kg)   SpO2 96%   BMI 27.67 kg/m?   ? ?Subjective:  ? ? [name], female    DOB: [birth year], 76 y.o.   MRN: [id number] ? ?HPI: ?Sarah White is a 77 y.o. female ? ?Chief Complaint  ?Patient presents with  ? Hypotension  ? ?HYPOTENSION- did not take her imdur today, has not been getting as dizzy. Did not cut her imdur in half ?Hypertension status: stable  ?Satisfied with current treatment? yes ?Duration of hypertension: chronic ?BP monitoring frequency:  not checking ?BP medication side effects:  yes- dizziness ?Medication compliance: excellent compliance ?Aspirin: yes ?Recurrent headaches: no ?Visual changes: no ?Palpitations: no ?Dyspnea: no ?Chest pain: no ?Lower extremity edema: no ?Dizzy/lightheaded: yes ? ? ?Relevant past medical, surgical, family and social history reviewed and updated as indicated. Interim medical history since our last visit reviewed. ?Allergies and medications reviewed and updated. ? ?Review of Systems  ?Constitutional: Negative.   ?Respiratory: Negative.    ?Cardiovascular: Negative.   ?Musculoskeletal: Negative.   ?Neurological: Negative.   ?Psychiatric/Behavioral: Negative.    ? ?Per HPI unless specifically indicated above ? ?   ?Objective:  ?  ?BP 123/75   Pulse 72   Temp 98.2 ?F (36.8 ?C)   Wt 161 lb 3.2 oz (73.1 kg)   SpO2 96%   BMI 27.67 kg/m?   ?Wt Readings from Last 3 Encounters:  ?11/25/21 161 lb 3.2 oz (73.1 kg)  ?10/23/21 150 lb (68 kg)  ?09/10/21 144 lb (65.3 kg)  ?  ?Physical Exam ?Vitals and nursing note reviewed.  ?Constitutional:   ?   General: She is not in acute distress. ?   Appearance: Normal appearance. She is not ill-appearing, toxic-appearing or diaphoretic.  ?HENT:  ?   Head: Normocephalic and atraumatic.  ?   Right Ear: External ear normal.  ?   Left Ear: External ear normal.  ?   Nose: Nose normal.  ?   Mouth/Throat:  ?   Mouth: Mucous membranes are moist.  ?    Pharynx: Oropharynx is clear.  ?Eyes:  ?   General: No scleral icterus.    ?   Right eye: No discharge.     ?   Left eye: No discharge.  ?   Extraocular Movements: Extraocular movements intact.  ?   Conjunctiva/sclera: Conjunctivae normal.  ?   Pupils: Pupils are equal, round, and reactive to light.  ?Cardiovascular:  ?   Rate and Rhythm: Normal rate and regular rhythm.  ?   Pulses: Normal pulses.  ?   Heart sounds: Normal heart sounds. No murmur heard. ?  No friction rub. No gallop.  ?Pulmonary:  ?   Effort: Pulmonary effort is normal. No respiratory distress.  ?   Breath sounds: Normal breath sounds. No stridor. No wheezing, rhonchi or rales.  ?Chest:  ?   Chest wall: No tenderness.  ?Musculoskeletal:     ?   General: Normal range of motion.  ?   Cervical back: Normal range of motion and neck supple.  ?Skin: ?   General: Skin is warm and dry.  ?   Capillary Refill: Capillary refill takes less than 2 seconds.  ?   Coloration: Skin is not jaundiced or pale.  ?   Findings: No bruising, erythema, lesion or rash.  ?Neurological:  ?   General: No focal  deficit present.  ?   Mental Status: She is alert and oriented to person, place, and time. Mental status is at baseline.  ?Psychiatric:     ?   Mood and Affect: Mood normal.     ?   Behavior: Behavior normal.     ?   Thought Content: Thought content normal.     ?   Judgment: Judgment normal.  ? ? ?Results for orders placed or performed during the hospital encounter of 09/10/21  ?Glucose, capillary  ?Result Value Ref Range  ? Glucose-Capillary 83 70 - 99 mg/dL  ?Surgical pathology  ?Result Value Ref Range  ? SURGICAL PATHOLOGY    ?  SURGICAL PATHOLOGY ?CASE: [id number] ?PATIENT: Sarah White ?Surgical Pathology Report ? ? ? ? ?Specimen Submitted: ?A. Duodenum; cbx ?B. Stomach; cbx ?C. Colon polyp x2, cecum; hot(1) cold(1) snare ?D. Colon, random; cbx ?E. Colon polyp x2, ascending; cold snare ?F. Colon polyp x3, tran; hot(1) cold(2) snare ?G. Colon polyp x2, descending;  cold snare ? ?Clinical History: Esophageal dysphagia R13.19 chronic diarrhea of ?unknown origin K52.9.  Gastric ulcer; gastric erosions, colon polyps, ?diverticulosis ? ? ? ? ? ?DIAGNOSIS: ?A. DUODENUM; COLD BIOPSY: ?- DUODENAL MUCOSA WITH NO SIGNIFICANT PATHOLOGIC ALTERATION. ?- NEGATIVE FOR FEATURES OF CELIAC DISEASE. ?- NEGATIVE FOR INTESTINAL METAPLASIA, DYSPLASIA, AND MALIGNANCY. ? ?B. STOMACH; COLD BIOPSY: ?- MILD CHRONIC GASTRITIS. ?- NEGATIVE FOR ACTIVE INFLAMMATION AND H PYLORI. ?- NEGATIVE FOR INTESTINAL METAPLASIA, DYSPLASIA, AND MALIGNANCY. ? ?C. COLON POLYP X2, CECUM; HOT AND COLD SNARE: ?- TUBULAR ADENOMA, MULTIPLE FRAGMENTS. ?- NEG ATIVE FOR HIGH GRADE DYSPLASIA AND MALIGNANCY. ? ?D. COLON, RANDOM; COLD BIOPSY: ?- COLONIC MUCOSA WITH NO SIGNIFICANT PATHOLOGIC ALTERATION. ?- NEGATIVE FOR ACTIVE INFLAMMATION AND FEATURES OF CHRONICITY. ?- NEGATIVE FOR MICROSCOPIC COLITIS, DYSPLASIA, AND MALIGNANCY. ? ?E. COLON POLYP X2, ASCENDING; COLD SNARE: ?- TUBULAR ADENOMA, MULTIPLE FRAGMENTS. ?- NEGATIVE FOR HIGH GRADE DYSPLASIA AND MALIGNANCY. ? ?F. COLON POLYP X3, TRANSVERSE; HOT AND COLD SNARE: ?- TUBULAR ADENOMA, MULTIPLE FRAGMENTS. ?- NEGATIVE FOR HIGH GRADE DYSPLASIA AND MALIGNANCY. ? ?G. COLON POLYP X2, DESCENDING; COLD SNARE: ?- TUBULAR ADENOMA, MULTIPLE FRAGMENTS. ?- NEGATIVE FOR HIGH GRADE DYSPLASIA AND MALIGNANCY. ? ? ?GROSS DESCRIPTION: ?A. Labeled: cbx duodenum rule out celiac ?Received: Formalin ?Collection time: 9:02 AM on 09/10/2021 ?Placed into formalin time: 9:02 AM on 09/10/2021 ?Tissue fragment(s): 4 ?Size: Aggregate, 0.8 x 0.5 x 0.2 cm ?Description: Tan-pink soft tissue fragments ?Entirely submitted in 1 ca ssette. ? ?B. Labeled: Gastric cbx rule out H. pylori ?Received: Formalin ?Collection time: 9:02 AM on 09/10/2021 ?Placed into formalin time: 9:02 AM on 09/10/2021 ?Tissue fragment(s): 1 ?Size: 0.5 x 0.3 x 0.2 cm ?Description: Tan soft tissue fragment ?Entirely submitted in 1 cassette. ? ?C.  Labeled: Hot snare x1/cold snare x1 cecum polyp ?Received: Formalin ?Collection time: 9:24 AM on 09/10/2021 ?Placed into formalin time: 9:24 AM on 09/10/2021 ?Tissue fragment(s): Multiple ?Size: Aggregate, 1.7 x 0.8 x 0.2 cm ?Description: Tan-pink soft tissue fragments ?Entirely submitted in 1 cassette. ? ?D. Labeled: Random colon cbx ?Received: Formalin ?Collection time: 9:31 AM on 09/10/2021 ?Placed into formalin time: 9:31 AM on 09/10/2021 ?Tissue fragment(s): Multiple ?Size: Aggregate, 2.2 x 0.6 x 0.2 cm ?Description: Received are fragments of tan-pink soft tissue admixed with ?intestinal debris.  The ratio of soft tissue to intestinal debris is 90: ?10. ?Entirely submitted in 1 cassette. ? ?E. Label ed: Cold snare ascending colon polyp x2 ?Received: Formalin ?Collection time: 9:36 AM on 09/10/2021 ?Placed into formalin time: 9:36 AM  on 09/10/2021 ?Tissue fragment(s): Multiple ?Size: Aggregate, 0.8 x 0.7 x 0.5 cm ?Description: Received are fragments of tan-pink soft tissue admixed with ?intestinal debris.  The ratio of soft tissue to intestinal debris is 90: ?10.  The largest soft tissue fragment has a resection margin which is ?inked blue.  This fragment is trisected. ?Entirely submitted in cassettes 1-2 with the trisected fragment in ?cassette 1 and the remaining fragments in cassette 2. ? ?F. Labeled: Hot snare x1/cold snare x2 transverse colon polyp ?Received: Formalin ?Collection time: 9:41 AM on 09/10/2021 ?Placed into formalin time: 9:41 AM on 09/10/2021 ?Tissue fragment(s): Multiple ?Size: Aggregate, 1.4 x 0.9 x 0.2 cm ?Description: Tan-pink soft tissue fragments ?Entirely submitted in 1 cassette. ? ?G. Labeled: Cold snare descending colon polyp x2 ?Received: Formalin ?Collecti on time: 9:43 AM on 09/10/2021 ?Placed into formalin time: 9:43 AM on 09/10/2021 ?Tissue fragment(s): Multiple ?Size: Aggregate, 1.7 x 0.5 x 0.2 cm ?Description: Received are fragments of tan-pink soft tissue admixed with ?intestinal  debris.  The ratio of soft tissue to intestinal debris is 95: ?5. ?Entirely submitted in 1 cassette. ? ?RB 09/10/2021 ? ?Final Diagnosis performed by Betsy Pries, MD.   Electronically signed ?09/11/2021 12:00:42PM ?The

## 2021-11-27 ENCOUNTER — Telehealth: Payer: Self-pay

## 2021-11-27 NOTE — Chronic Care Management (AMB) (Signed)
? ? ?Chronic Care Management ?Pharmacy Assistant  ? ?Name: Sarah White  MRN: [id number] DOB: 01-09-1946 ? ?Reason for Encounter: Patient assistance application [employer] and Judithann Sauger ?  ? ? ?Medications: ?Outpatient Encounter Medications as of 11/27/2021  ?Medication Sig Note  ? albuterol (ACCUNEB) 0.63 MG/3ML nebulizer solution Take 3 mLs (0.63 mg total) by nebulization every 6 (six) hours as needed for wheezing.   ? albuterol (VENTOLIN HFA) 108 (90 Base) MCG/ACT inhaler Inhale 2 puffs into the lungs every 6 (six) hours as needed for wheezing or shortness of breath. 2020-04-03: Taking 2 puffs BID  ? amiodarone (PACERONE) 200 MG tablet Take 0.5 tablets by mouth daily.   ? aspirin EC 81 MG tablet Take 81 mg by mouth at bedtime.    ? budesonide-formoterol (SYMBICORT) 160-4.5 MCG/ACT inhaler Inhale 2 puffs into the lungs 2 (two) times daily.   ? buPROPion (WELLBUTRIN XL) 150 MG 24 hr tablet Take by mouth.   ? carvedilol (COREG) 12.5 MG tablet Take 12.5 mg by mouth daily.   ? clopidogrel (PLAVIX) 75 MG tablet Take 1 tablet (75 mg total) by mouth daily at 6 (six) AM.   ? cyclobenzaprine (FLEXERIL) 10 MG tablet TAKE 1 TABLET BY MOUTH THREE TIMES DAILY AS NEEDED FOR MUSCLE SPASM. DO NOT DRIVE WHILE ON THE MED. WILL MAKE YOU SLEEPY   ? diphenhydrAMINE (BENADRYL) 25 MG tablet Take 25 mg by mouth at bedtime as needed for allergies. Dies not take regularly 04-03-2020: Taking 1 QPM  ? donepezil (ARICEPT) 10 MG tablet TAKE 1 TABLET BY MOUTH NIGHTLY FOR 30 DAYS   ? furosemide (LASIX) 40 MG tablet Take 1 tablet (40 mg total) by mouth daily.   ? isosorbide mononitrate (IMDUR) 30 MG 24 hr tablet Take 1 tablet (30 mg total) by mouth daily.   ? nitroGLYCERIN (NITROSTAT) 0.4 MG SL tablet DISSOLVE ONE TABLET UNDER THE TONGUE EVERY 5 MINUTES AS NEEDED FOR CHEST PAIN.  DO NOT EXCEED A TOTAL OF 3 DOSES IN 15 MINUTES   ? pantoprazole (PROTONIX) 40 MG tablet Take 1 tablet (40 mg total) by mouth 2 (two) times daily before a meal.   ? QUEtiapine  (SEROQUEL XR) 50 MG TB24 24 hr tablet Take 2 tablets (100 mg total) by mouth at bedtime.   ? rosuvastatin (CRESTOR) 20 MG tablet Take 1 tablet (20 mg total) by mouth daily.   ? sacubitril-valsartan (ENTRESTO) 49-51 MG Take 1 tablet by mouth 2 (two) times daily.    ? sertraline (ZOLOFT) 100 MG tablet Take 2 tablets (200 mg total) by mouth daily.   ? silver sulfADIAZINE (SILVADENE) 1 % cream Apply 1 application topically daily. (Patient taking differently: Apply 1 application. topically as needed.)   ? tiotropium (SPIRIVA HANDIHALER) 18 MCG inhalation capsule PLACE 1 CAPSULE INTO INHALER AND INHALE DAILY   ? traZODone (DESYREL) 50 MG tablet Take 1 tablet (50 mg total) by mouth at bedtime as needed for sleep.   ? triamcinolone ointment (KENALOG) 0.5 % Apply 1 application topically 2 (two) times daily.   ? ?No facility-administered encounter medications on file as of 11/27/2021.  ? ?I have prefilled and mailed both patient assistance application for patient. I attempted to reach out to the patient explain that the [facility] application and the [place] application through her PCP will need to be signed by her Cardiologist and to bring back to [place]. I have left note on the mailed application about this since I couldn't get in touch with the patient. ? ?Myriam  Elta Guadeloupe, [address] ?Health Concierge ? ?

## 2021-11-28 DIAGNOSIS — I10 Essential (primary) hypertension: Secondary | ICD-10-CM

## 2021-11-28 DIAGNOSIS — J449 Chronic obstructive pulmonary disease, unspecified: Secondary | ICD-10-CM

## 2021-11-28 DIAGNOSIS — E782 Mixed hyperlipidemia: Secondary | ICD-10-CM

## 2021-12-16 ENCOUNTER — Telehealth: Payer: Self-pay | Admitting: Family Medicine

## 2021-12-16 NOTE — Telephone Encounter (Signed)
Paperwork was dropped off for provider to complete and was placed in the provider for completion.  ?

## 2021-12-17 NOTE — Telephone Encounter (Signed)
Patient brought in application for [employer] signed by herself and Dr. Clayborn Bigness. I have placed it in the yellow folder for Edison Nasuti. It had a sticky note to return forms to [facility].  ?

## 2021-12-22 ENCOUNTER — Ambulatory Visit (INDEPENDENT_AMBULATORY_CARE_PROVIDER_SITE_OTHER): Payer: Medicare Other

## 2021-12-22 DIAGNOSIS — J449 Chronic obstructive pulmonary disease, unspecified: Secondary | ICD-10-CM

## 2021-12-22 DIAGNOSIS — I10 Essential (primary) hypertension: Secondary | ICD-10-CM

## 2021-12-22 NOTE — Progress Notes (Signed)
? ?Chronic Care Management ?Pharmacy Note ? ?12/22/2021 ?Name:  Sarah White MRN:  [id number] DOB:  1946/02/22 ? ?Summary: ?Pt now using bevespi inhaler through pap (previously on symbicort + spiriva). Breztri was actually discussed/planned for pap last visit, however reports to be very happy with control on bevespi. Denies any worsening issues with COPD. ?Entresto had been left at front desk here, signed by pt and cardiologist - I faxed to [employer] 12/22/2021. Unsure when last seen by cardiology  ?Denies any low BP/dizziness past ~3 weeks on imdur 28m daily. ?CHF only noted as acute dgx in problem list, consider update as appropriate  ? ?Subjective: ?Sarah WEIGANDis an 76y.o. year old female who is a primary patient of JValerie Roys DO.  The CCM team was consulted for assistance with disease management and care coordination needs.   ? ?Engaged with patient by telephone for follow up visit in response to provider referral for pharmacy case management and/or care coordination services.  ? ?Consent to Services:  ?The patient was given information about Chronic Care Management services, agreed to services, and gave verbal consent prior to initiation of services.  Please see initial visit note for detailed documentation.  ? ?Patient Care Team: ?JValerie Roys [name] ?CYolonda Kida MD as Consulting Physician (Cardiology) ?CYolonda Kida MD as Consulting Physician (Cardiology) ?TVanita Ingles RN as Case Manager (General Practice) ?PMadelin Rear RWyoming Recover LLCas Pharmacist (General Practice) ? ?Hospital visits: ?None in previous 6 months ? ?Objective: ? ?Lab Results  ?Component Value Date  ? CREATININE 0.99 06/23/2021  ? CREATININE 0.82 09/24/2020  ? CREATININE 0.83 08/19/2020  ? ? ?Lab Results  ?Component Value Date  ? HGBA1C 5.1 06/23/2021  ? HGBA1C 5.1 08/07/2020  ? HGBA1C 5.6 04/18/2020  ? ?Last diabetic Eye exam:  ?Lab Results  ?Component Value Date/Time  ? HMDIABEYEEXA No  Retinopathy 09/10/2015 01:16 PM  ?  ?Last diabetic Foot exam: No results found for: HMDIABFOOTEX  ? ?   ?Component Value Date/Time  ? CHOL 190 06/23/2021 1518  ? CHOL 142 04/18/2020 1623  ? CHOL 135 12/18/2019 1504  ? CHOL 164 02/04/2016 1043  ? CHOL 258 (H) 01/16/2014 1200  ? TRIG 243 (H) 06/23/2021 1518  ? TRIG 152 (H) 04/18/2020 1623  ? TRIG 138 12/18/2019 1504  ? TRIG 258 (H) 02/04/2016 1043  ? TRIG 337 (H) 01/16/2014 1200  ? HDL 54 06/23/2021 1518  ? HDL 41 04/18/2020 1623  ? HDL 42 12/18/2019 1504  ? HDL 35 (L) 01/16/2014 1200  ? CHOLHDL 4.3 11/26/2016 0810  ? VLDL 51 (H) 11/26/2016 0810  ? VLDL 52 (H) 02/04/2016 1043  ? VLDL 67 (H) 01/16/2014 1200  ? L[place]95 06/23/2021 1518  ? LL[place]75 04/18/2020 1623  ? L[place]69 12/18/2019 1504  ? LDLCALC 156 (H) 01/16/2014 1200  ? ? ? ?  Latest Ref Rng & Units 06/23/2021  ?  3:18 PM 09/24/2020  ?  2:02 PM 08/07/2020  ? 10:37 AM  ?Hepatic Function  ?Total Protein 6.0 - 8.5 g/dL 6.1   6.2   6.4    ?Albumin 3.7 - 4.7 g/dL 4.2   4.3   4.3    ?AST 0 - 40 IU/L 18   20   125    ?ALT 0 - 32 IU/L 18   13   96    ?Alk Phosphatase 44 - 121 IU/L 98   101   138    ?  Total Bilirubin 0.0 - 1.2 mg/dL 0.2   <0.2   0.5    ? ? ?Lab Results  ?Component Value Date/Time  ? TSH 2.940 06/23/2021 03:18 PM  ? TSH 2.350 03/24/2018 10:43 AM  ? ? ? ?  Latest Ref Rng & Units 06/23/2021  ?  3:18 PM 09/24/2020  ?  2:02 PM 08/07/2020  ? 10:37 AM  ?CBC  ?WBC 3.4 - 10.8 x10E3/uL 6.8   4.6   6.2    ?Hemoglobin 11.1 - 15.9 g/dL 14.0   13.8   13.7    ?Hematocrit 34.0 - 46.6 % 41.9   41.7   41.9    ?Platelets 150 - 450 x10E3/uL 146   112   121    ? ?Cardiac Catheterization: (12/2019) ?Narrative ?? Prox LAD lesion is 40% stenosed.  ?? Prox Cx lesion is 75% stenosed.  ?? Prox Cx to Mid Cx lesion is 100% stenosed.  ?? Lat 1st Mrg lesion is 100% stenosed.  ?? Prox RCA lesion is 100% stenosed.  ?? 1st Diag lesion is 50% stenosed.  ?? Mid LAD lesion is 50% stenosed.  ?? Dist RCA lesion is 100% stenosed with 100%  stenosed side branch in RPAV.   ?Moderate to severely depressed overall left ventricular function of around  ?35 to 40%  ? ?No results found for: VD25OH ? ?Clinical ASCVD:  ?The ASCVD Risk score (Arnett DK, et al., 2019) failed to calculate for the following reasons: ?  The patient has a prior MI or stroke diagnosis   ?Social History  ? ?Tobacco Use  ?Smoking Status Every Day  ? Packs/day: 1.00  ? Types: Cigarettes  ?Smokeless Tobacco Never  ? ?BP Readings from Last 3 Encounters:  ?11/25/21 123/75  ?10/23/21 (!) 105/58  ?09/10/21 (!) 177/72  ? ?Pulse Readings from Last 3 Encounters:  ?11/25/21 72  ?10/23/21 62  ?09/10/21 68  ? ?Wt Readings from Last 3 Encounters:  ?11/25/21 161 lb 3.2 oz (73.1 kg)  ?10/23/21 150 lb (68 kg)  ?09/10/21 144 lb (65.3 kg)  ? ?BMI Readings from Last 3 Encounters:  ?11/25/21 27.67 kg/m?  ?10/23/21 25.75 kg/m?  ?09/10/21 24.72 kg/m?  ? ? ?Assessment: Review of patient past medical history, allergies, medications, health status, including review of consultants reports, laboratory and other test data, was performed as part of comprehensive evaluation and provision of chronic care management services.  ? ?SDOH:  (Social Determinants of Health) assessments and interventions performed: Yes ? ? ?[place] ? ?Allergies  ?Allergen Reactions  ? Amlodipine Swelling  ? Atorvastatin Other (See Comments)  ?  myalgia  ? Codeine Nausea And Vomiting  ? Hctz [Hydrochlorothiazide] Other (See Comments)  ?  Hypokalemia  ? Lisinopril Other (See Comments)  ?  Dizziness ?  ? Metformin And Related Nausea Only  ?  Nausea, dizziness  ? ? ?Medications Reviewed Today   ? ? Reviewed by Valerie Roys, DO (Physician) on 11/25/21 at 1345  Med List Status: <None>  ? ?Medication Order Taking? Sig Documenting Provider Last Dose Status Informant  ?albuterol (ACCUNEB) 0.63 MG/3ML nebulizer solution [id number] Yes Take 3 mLs (0.63 mg total) by nebulization every 6 (six) hours as needed for wheezing. Valerie Roys, DO  Taking Active   ?albuterol (VENTOLIN HFA) 108 (90 Base) MCG/ACT inhaler [id number] Yes Inhale 2 puffs into the lungs every 6 (six) hours as needed for wheezing or shortness of breath. [place] P, DO Taking Active   ?         ?  Med Note Darnelle Maffucci, CATHERINE E   Tue Mar 26, 2020  1:59 PM) Taking 2 puffs BID  ?amiodarone (PACERONE) 200 MG tablet [id number] Yes Take 0.5 tablets by mouth daily. [provider] Taking Active   ?aspirin EC 81 MG tablet [id number] Yes Take 81 mg by mouth at bedtime.  [provider] Taking Active Self  ?budesonide-formoterol (SYMBICORT) 160-4.5 MCG/ACT inhaler [id number] Yes Inhale 2 puffs into the lungs 2 (two) times daily. Valerie Roys, DO Taking Active   ?buPROPion (WELLBUTRIN XL) 150 MG 24 hr tablet [id number] Yes Take by mouth. [provider] Taking Active   ?carvedilol (COREG) 12.5 MG tablet [id number] Yes Take 12.5 mg by mouth daily. [provider] Taking Active   ?clopidogrel (PLAVIX) 75 MG tablet [id number] Yes Take 1 tablet (75 mg total) by mouth daily at 6 (six) AM. Valerie Roys, DO Taking Active   ?cyclobenzaprine (FLEXERIL) 10 MG tablet [id number] Yes TAKE 1 TABLET BY MOUTH THREE TIMES DAILY AS NEEDED FOR MUSCLE SPASM. DO NOT DRIVE WHILE ON THE MED. WILL MAKE YOU SLEEPY Johnson, [name], DO Taking Active   ?diphenhydrAMINE (BENADRYL) 25 MG tablet [id number] Yes Take 25 mg by mouth at bedtime as needed for allergies. Dies not take regularly [provider] Taking Active Self  ?         ?Med Note ([place] Mar 26, 2020  2:01 PM) Taking 1 QPM  ?donepezil (ARICEPT) 10 MG tablet [id number] Yes TAKE 1 TABLET BY MOUTH NIGHTLY FOR 30 DAYS [provider] Taking Active   ?furosemide (LASIX) 40 MG tablet [id number] Yes Take 1 tablet (40 mg total) by mouth daily. [place] Liter P, DO Taking Active   ?isosorbide mononitrate (IMDUR) 30 MG 24 hr tablet [id number] Yes Take 1 tablet (30 mg total) by mouth daily. Johnson,  [name], DO Taking Active   ?nitroGLYCERIN (NITROSTAT) 0.4 MG SL tablet [id number] Yes DISSOLVE ONE TABLET UNDER THE TONGUE EVERY 5 MINUTES AS NEEDED FOR CHEST PAIN.  DO NOT EXCEED A TOTAL OF 3 DOSES IN 15 MIN

## 2021-12-22 NOTE — Patient Instructions (Signed)
Sarah White, ? ?Thank you for talking with me today. I have included our care plan/goals in the following pages.  ? ?Please review and call me at [phone] with any questions. ? ?Thanks! ?Edison Nasuti  ? ?Madelin Rear, PharmD ?Clinical Pharmacist  ?[phone] ? ?Care Plan : [place]  ?Updates made by Madelin Rear, [facility] since 12/22/2021 12:00 AM  ?  ? ?Problem: DM, CAD, HF, COPD, tobacco use, HLD,   ?Priority: High  ?  ? ?Long-Range Goal: Disease Management   ?Start Date: 12/22/2021  ?Recent Progress: On track  ?Priority: High  ?Note:   ?Pharmacist Clinical Goal(s):  ?Patient will verbalize ability to afford treatment regimen ?achieve adherence to monitoring guidelines and medication adherence to achieve therapeutic efficacy through collaboration with PharmD and provider.  ? ?Interventions: ?1:1 collaboration with Valerie Roys, DO regarding development and update of comprehensive plan of care as evidenced by provider attestation and co-signature ?Inter-disciplinary care team collaboration (see longitudinal plan of care) ?Comprehensive medication review performed; medication list updated in electronic medical record ? ?Hypertension, Heart Failure (BP goal <130/80) ?-Not ideally controlled ?GFR 60s, last EF 35-40% 11/2019 ?-reports 150 lb as baseline weight, still not entirely consistent with daily weights, reviewed HF action plan with patient  ?-Current treatment: ?Entresto 49-'51mg'$  once daily (submitted for PAP through Cardiology) ?Imdur 30 mg daily (10/2021 - cut from '60mg'$  to 30 mg d/t lows) ?Carvedilol 12.5 mg BID ?Furosemide 40 mg once daily  ?Amiodarone 100 mg daily  ?-Medications previously tried: amlodipine, lisinopril ?-Current home readings: varies, none specific provided ?-Current dietary habits: low salt ?-Current exercise habits: no formal exercise ?-Denies hypotensive/hypertensive symptoms, reports BP at home 130-140/70 ?-Educated on BP goals and benefits of medications for prevention of heart  attack, stroke and kidney damage; ?Importance of home blood pressure monitoring; ?-Counseled to monitor BP at home 1-2x/wk, daily weights upon wakening, document, and provide log at future appointments ?-Counseled on diet and exercise extensively ?Recommended to continue current medication ? ?COPD (Goal: control symptoms and prevent exacerbations) ?-Controlled ?-Current treatment  ?Bevespi (Approved through PAP) ?-Medications previously tried: n/a  ?-Gold Grade:  ?-Current COPD Classification:  A (low sx, <2 exacerbations/yr) ?-MMRC/CAT score: 0 ?-Pulmonary function testing:  ?-Exacerbations requiring treatment in last 6 months: 0 ?-Patient reports consistent use of maintenance inhaler ?-Frequency of rescue inhaler use: infrequent ?-Counseled on [place] inhaler technique; ?Benefits of consistent maintenance inhaler use ?When to use rescue inhaler ?-Counseled on diet and exercise extensively ?Recommended to continue current medication ?Assessed patient finances. Approved for Bevespi PAP, next step would be Breztri through PAP.  ?  ? ?  ? ? ?The patient verbalized understanding of instructions provided today and agreed to receive a MyChart copy of patient instruction and/or educational materials. ?Telephone follow up appointment with pharmacy team member scheduled for: See next appointment with "Care Management Staff" under "What's Next" below.    ?

## 2021-12-23 ENCOUNTER — Ambulatory Visit: Payer: Medicare Other | Admitting: Family Medicine

## 2021-12-28 DIAGNOSIS — I10 Essential (primary) hypertension: Secondary | ICD-10-CM

## 2021-12-28 DIAGNOSIS — J449 Chronic obstructive pulmonary disease, unspecified: Secondary | ICD-10-CM

## 2022-01-06 ENCOUNTER — Other Ambulatory Visit: Payer: Self-pay | Admitting: Family Medicine

## 2022-01-07 NOTE — Telephone Encounter (Signed)
Requested Prescriptions  ?Pending Prescriptions Disp Refills  ?? QUEtiapine (SEROQUEL XR) 50 MG TB24 24 hr tablet [Pharmacy Med Name: QUEtiapine Fumarate ER 50 MG Oral Tablet Extended Release 24 Hour] 180 tablet 0  ?  Sig: TAKE 2 TABLETS BY MOUTH AT BEDTIME  ?  ? Not Delegated - Psychiatry:  Antipsychotics - Second Generation (Atypical) - quetiapine Failed - 01/06/2022  3:18 PM  ?  ?  Failed - This refill cannot be delegated  ?  ?  Failed - Lipid Panel in normal range within the last 12 months  ?  Cholesterol, Total  ?Date Value Ref Range Status  ?06/23/2021 190 100 - 199 mg/dL Final  ? ?Cholesterol  ?Date Value Ref Range Status  ?01/16/2014 258 (H) 0 - 200 mg/dL Final  ? ?Cholesterol [place], [place]  ?Date Value Ref Range Status  ?02/04/2016 164 <200 mg/dL Final  ?  Comment:  ?                          Desirable                <200 ?                        Borderline High      200- 239 ?                        High                     >239 ?  ? ?Ldl Cholesterol, Calc  ?Date Value Ref Range Status  ?01/16/2014 156 (H) 0 - 100 mg/dL Final  ? ?LDL Chol Calc (NIH)  ?Date Value Ref Range Status  ?06/23/2021 95 0 - 99 mg/dL Final  ? ?HDL Cholesterol  ?Date Value Ref Range Status  ?01/16/2014 35 (L) 40 - 60 mg/dL Final  ? ?HDL  ?Date Value Ref Range Status  ?06/23/2021 54 >39 mg/dL Final  ? ?Triglycerides  ?Date Value Ref Range Status  ?06/23/2021 243 (H) 0 - 149 mg/dL Final  ?01/16/2014 337 (H) 0 - 200 mg/dL Final  ? ?Triglycerides Piccolo,Waived  ?Date Value Ref Range Status  ?02/04/2016 258 (H) <150 mg/dL Final  ?  Comment:  ?                          Normal                   <150 ?                        Borderline High     150 - 199 ?                        High                200 - 499 ?                        Very High                >499 ?  ? ?  ?  ?  Passed - TSH in normal range and within 360 days  ?  TSH  ?Date Value Ref Range Status  ?06/23/2021 2.940 0.450 -  4.500 uIU/mL Final  ?   ?  ?  Passed - Completed  PHQ-2 or PHQ-9 in the last 360 days  ?  ?  Passed - Last BP in normal range  ?  BP Readings from Last 1 Encounters:  ?11/25/21 123/75  ?   ?  ?  Passed - Last Heart Rate in normal range  ?  Pulse Readings from Last 1 Encounters:  ?11/25/21 72  ?   ?  ?  Passed - Valid encounter within last 6 months  ?  Recent Outpatient Visits   ?      ? 1 month ago Orthostatic hypotension  ? [place] P, DO  ? 2 months ago Hypotension due to drugs  ? [place] P, DO  ? 5 months ago Depression, major, single episode, moderate ([place])  ? [place], [name], DO  ? 6 months ago Essential (primary) hypertension  ? [place], [name], DO  ? 1 year ago Diarrhea of presumed infectious origin  ? [place], [place] P, DO  ?  ?  ?Future Appointments   ?        ? In 2 weeks [place], Tally Due, MD [place]  ? In 3 months  [place], [facility]  ?  ? ?  ?  ?  Passed - CBC within normal limits and completed in the last 12 months  ?  WBC  ?Date Value Ref Range Status  ?06/23/2021 6.8 3.4 - 10.8 x10E3/uL Final  ?03/08/2020 4.6 4.0 - 10.5 K/uL Final  ? ?RBC  ?Date Value Ref Range Status  ?06/23/2021 4.82 3.77 - 5.28 x10E6/uL Final  ?03/08/2020 4.04 3.87 - 5.11 MIL/uL Final  ? ?Hemoglobin  ?Date Value Ref Range Status  ?06/23/2021 14.0 11.1 - 15.9 g/dL Final  ? ?Hematocrit  ?Date Value Ref Range Status  ?06/23/2021 41.9 34.0 - 46.6 % Final  ? ?MCHC  ?Date Value Ref Range Status  ?06/23/2021 33.4 31.5 - 35.7 g/dL Final  ?03/08/2020 31.4 30.0 - 36.0 g/dL Final  ? ?MCH  ?Date Value Ref Range Status  ?06/23/2021 29.0 26.6 - 33.0 pg Final  ?03/08/2020 27.5 26.0 - 34.0 pg Final  ? ?MCV  ?Date Value Ref Range Status  ?06/23/2021 87 79 - 97 fL Final  ?01/16/2014 84 80 - 100 fL Final  ? ?No results found for: PLTCOUNTKUC, LABPLAT, [place] ?RDW  ?Date Value Ref Range Status  ?06/23/2021 14.3 11.7 - 15.4 % Final   ?01/16/2014 14.1 11.5 - 14.5 % Final  ? ?  ?  ?  Passed - CMP within normal limits and completed in the last 12 months  ?  Albumin  ?Date Value Ref Range Status  ?06/23/2021 4.2 3.7 - 4.7 g/dL Final  ?01/15/2014 3.7 3.4 - 5.0 g/dL Final  ? ?Alkaline Phosphatase  ?Date Value Ref Range Status  ?06/23/2021 98 44 - 121 IU/L Final  ?01/15/2014 125 (H) Unit/L Final  ?  Comment:  ?  45-117 ?NOTE: New Reference Range ?07/21/13 ?  ? ?ALT  ?Date Value Ref Range Status  ?06/23/2021 18 0 - 32 IU/L Final  ? ?SGPT (ALT)  ?Date Value Ref Range Status  ?01/15/2014 28 12 - 78 U/L Final  ? ?AST  ?Date Value Ref Range Status  ?06/23/2021 18 0 - 40 IU/L Final  ? ?SGOT(AST)  ?Date Value Ref Range Status  ?01/15/2014 27 15 - 37  Unit/L Final  ? ?BUN  ?Date Value Ref Range Status  ?06/23/2021 14 8 - 27 mg/dL Final  ?01/15/2014 11 7 - 18 mg/dL Final  ? ?Calcium  ?Date Value Ref Range Status  ?06/23/2021 9.3 8.7 - 10.3 mg/dL Final  ? ?Calcium, Total  ?Date Value Ref Range Status  ?01/15/2014 9.2 8.5 - 10.1 mg/dL Final  ? ?CO2  ?Date Value Ref Range Status  ?06/23/2021 24 20 - 29 mmol/L Final  ? ?Co2  ?Date Value Ref Range Status  ?01/15/2014 26 21 - 32 mmol/L Final  ? ?Bicarbonate  ?Date Value Ref Range Status  ?12/03/2019 29.0 (H) 20.0 - 28.0 mmol/L Final  ? ?Creatinine  ?Date Value Ref Range Status  ?01/15/2014 0.52 (L) 0.60 - 1.30 mg/dL Final  ? ?Creatinine, Ser  ?Date Value Ref Range Status  ?06/23/2021 0.99 0.57 - 1.00 mg/dL Final  ? ?Glucose  ?Date Value Ref Range Status  ?06/23/2021 89 70 - 99 mg/dL Final  ?01/15/2014 270 (H) 65 - 99 mg/dL Final  ? ?Glucose, Bld  ?Date Value Ref Range Status  ?03/08/2020 118 (H) 70 - 99 mg/dL Final  ?  Comment:  ?  Glucose reference range applies only to samples taken after fasting for at least 8 hours.  ? ?Glucose-Capillary  ?Date Value Ref Range Status  ?09/10/2021 83 70 - 99 mg/dL Final  ?  Comment:  ?  Glucose reference range applies only to samples taken after fasting for at least 8 hours.   ? ?Potassium  ?Date Value Ref Range Status  ?06/23/2021 3.8 3.5 - 5.2 mmol/L Final  ?01/15/2014 4.2 3.5 - 5.1 mmol/L Final  ? ?Sodium  ?Date Value Ref Range Status  ?06/23/2021 140 134 - 144 mmol/L Final  ?01/15/2014 134 (L) 136 - 145 mmol/L Final  ? ?Bilirubin,Total  ?Date Value Ref Range Status  ?01/15/2014 0.4 0.2 - 1.0 mg/dL Final  ? ?Bilirubin Total  ?Date Value Ref Range Status  ?06/23/2021 0.2 0.0 - 1.2 mg/dL Final  ? ?Bilirubin, Direct  ?Date Value Ref Range Status  ?09/08/2018 0.3 (H) 0.0 - 0.2 mg/dL Final  ? ?Indirect Bilirubin  ?Date Value Ref Range Status  ?09/08/2018 0.6 0.3 - 0.9 mg/dL Final  ?  Comment:  ?  Performed at [facility], [address] [place], [place] [address]  ? ?Protein, ur  ?Date Value Ref Range Status  ?12/03/2019 100 (A) NEGATIVE mg/dL Final  ? ?Protein,UA  ?Date Value Ref Range Status  ?06/23/2021 Trace (A) Negative/Trace Final  ? ?Total Protein  ?Date Value Ref Range Status  ?06/23/2021 6.1 6.0 - 8.5 g/dL Final  ?01/15/2014 7.3 6.4 - 8.2 g/dL Final  ? ?EGFR (African American)  ?Date Value Ref Range Status  ?01/15/2014 >60  Final  ? ?GFR calc Af Wyvonnia Lora  ?Date Value Ref Range Status  ?09/24/2020 83 >59 mL/min/1.73 Final  ?  Comment:  ?  **In accordance with recommendations from the NKF-ASN Task force,** ?  [facility] is in the process of updating its eGFR calculation to the ?  2021 CKD-EPI creatinine equation that estimates kidney function ?  without a race variable. ?  ? ?eGFR  ?Date Value Ref Range Status  ?06/23/2021 61 >59 mL/min/1.73 Final  ? ?EGFR (Non-African Amer.)  ?Date Value Ref Range Status  ?01/15/2014 >60  Final  ?  Comment:  ?  eGFR values <39m/min/1.73 m2 may be an indication of chronic ?kidney disease (CKD). ?Calculated eGFR is useful in patients with stable renal function. ?The  eGFR calculation will not be reliable in acutely ill patients ?when serum creatinine is changing rapidly. It is not useful in  ?patients on dialysis. The eGFR calculation may not be  applicable ?to patients at the low and high extremes of body sizes, pregnant ?women, and vegetarians. ?  ? ?GFR calc non Af Amer  ?Date Value Ref Range Status  ?09/24/2020 72 >59 mL/min/1.73 Final  ? ?  ?  ?  ??

## 2022-01-07 NOTE — Telephone Encounter (Signed)
Requested medication (s) are due for refill today: yes ? ?Requested medication (s) are on the active medication list: yes ? ?Last refill:  11/23/21 #30/0 for flexeril, seroquel 06/23/21 #180/1 ? ?Future visit scheduled: no ? ?Notes to clinic:  Unable to refill per protocol, cannot delegate. ? ?  ?Requested Prescriptions  ?Pending Prescriptions Disp Refills  ? QUEtiapine (SEROQUEL XR) 50 MG TB24 24 hr tablet [Pharmacy Med Name: QUEtiapine Fumarate ER 50 MG Oral Tablet Extended Release 24 Hour] 180 tablet 0  ?  Sig: TAKE 2 TABLETS BY MOUTH AT BEDTIME  ?  ? Not Delegated - Psychiatry:  Antipsychotics - Second Generation (Atypical) - quetiapine Failed - 01/06/2022  3:18 PM  ?  ?  Failed - This refill cannot be delegated  ?  ?  Failed - Lipid Panel in normal range within the last 12 months  ?  Cholesterol, Total  ?Date Value Ref Range Status  ?06/23/2021 190 100 - 199 mg/dL Final  ? ?Cholesterol  ?Date Value Ref Range Status  ?01/16/2014 258 (H) 0 - 200 mg/dL Final  ? ?Cholesterol [place], [place]  ?Date Value Ref Range Status  ?02/04/2016 164 <200 mg/dL Final  ?  Comment:  ?                          Desirable                <200 ?                        Borderline High      200- 239 ?                        High                     >239 ?  ? ?Ldl Cholesterol, Calc  ?Date Value Ref Range Status  ?01/16/2014 156 (H) 0 - 100 mg/dL Final  ? ?LDL Chol Calc (NIH)  ?Date Value Ref Range Status  ?06/23/2021 95 0 - 99 mg/dL Final  ? ?HDL Cholesterol  ?Date Value Ref Range Status  ?01/16/2014 35 (L) 40 - 60 mg/dL Final  ? ?HDL  ?Date Value Ref Range Status  ?06/23/2021 54 >39 mg/dL Final  ? ?Triglycerides  ?Date Value Ref Range Status  ?06/23/2021 243 (H) 0 - 149 mg/dL Final  ?01/16/2014 337 (H) 0 - 200 mg/dL Final  ? ?Triglycerides Piccolo,Waived  ?Date Value Ref Range Status  ?02/04/2016 258 (H) <150 mg/dL Final  ?  Comment:  ?                          Normal                   <150 ?                        Borderline High     150 -  199 ?                        High                200 - 499 ?  Very High                >499 ?  ? ?  ?  ?  Passed - TSH in normal range and within 360 days  ?  TSH  ?Date Value Ref Range Status  ?06/23/2021 2.940 0.450 - 4.500 uIU/mL Final  ?  ?  ?  ?  Passed - Completed PHQ-2 or PHQ-9 in the last 360 days  ?  ?  Passed - Last BP in normal range  ?  BP Readings from Last 1 Encounters:  ?11/25/21 123/75  ?  ?  ?  ?  Passed - Last Heart Rate in normal range  ?  Pulse Readings from Last 1 Encounters:  ?11/25/21 72  ?  ?  ?  ?  Passed - Valid encounter within last 6 months  ?  Recent Outpatient Visits   ? ?      ? 1 month ago Orthostatic hypotension  ? [place] P, DO  ? 2 months ago Hypotension due to drugs  ? [place] P, DO  ? 5 months ago Depression, major, single episode, moderate ([place])  ? [place], [name], DO  ? 6 months ago Essential (primary) hypertension  ? [place], [name], DO  ? 1 year ago Diarrhea of presumed infectious origin  ? [place], [place] P, DO  ? ?  ?  ?Future Appointments   ? ?        ? In 2 weeks [place], Tally Due, MD [place]  ? In 3 months  [place], [facility]  ? ?  ? ? ?  ?  ?  Passed - CBC within normal limits and completed in the last 12 months  ?  WBC  ?Date Value Ref Range Status  ?06/23/2021 6.8 3.4 - 10.8 x10E3/uL Final  ?03/08/2020 4.6 4.0 - 10.5 K/uL Final  ? ?RBC  ?Date Value Ref Range Status  ?06/23/2021 4.82 3.77 - 5.28 x10E6/uL Final  ?03/08/2020 4.04 3.87 - 5.11 MIL/uL Final  ? ?Hemoglobin  ?Date Value Ref Range Status  ?06/23/2021 14.0 11.1 - 15.9 g/dL Final  ? ?Hematocrit  ?Date Value Ref Range Status  ?06/23/2021 41.9 34.0 - 46.6 % Final  ? ?MCHC  ?Date Value Ref Range Status  ?06/23/2021 33.4 31.5 - 35.7 g/dL Final  ?03/08/2020 31.4 30.0 - 36.0 g/dL Final  ? ?MCH  ?Date Value Ref Range Status   ?06/23/2021 29.0 26.6 - 33.0 pg Final  ?03/08/2020 27.5 26.0 - 34.0 pg Final  ? ?MCV  ?Date Value Ref Range Status  ?06/23/2021 87 79 - 97 fL Final  ?01/16/2014 84 80 - 100 fL Final  ? ?No results found for: PLTCOUNTKUC, LABPLAT, [place] ?RDW  ?Date Value Ref Range Status  ?06/23/2021 14.3 11.7 - 15.4 % Final  ?01/16/2014 14.1 11.5 - 14.5 % Final  ? ?  ?  ?  Passed - CMP within normal limits and completed in the last 12 months  ?  Albumin  ?Date Value Ref Range Status  ?06/23/2021 4.2 3.7 - 4.7 g/dL Final  ?01/15/2014 3.7 3.4 - 5.0 g/dL Final  ? ?Alkaline Phosphatase  ?Date Value Ref Range Status  ?06/23/2021 98 44 - 121 IU/L Final  ?01/15/2014 125 (H) Unit/L Final  ?  Comment:  ?  45-117 ?NOTE: New Reference Range ?07/21/13 ?  ? ?ALT  ?Date Value Ref Range  Status  ?06/23/2021 18 0 - 32 IU/L Final  ? ?SGPT (ALT)  ?Date Value Ref Range Status  ?01/15/2014 28 12 - 78 U/L Final  ? ?AST  ?Date Value Ref Range Status  ?06/23/2021 18 0 - 40 IU/L Final  ? ?SGOT(AST)  ?Date Value Ref Range Status  ?01/15/2014 27 15 - 37 Unit/L Final  ? ?BUN  ?Date Value Ref Range Status  ?06/23/2021 14 8 - 27 mg/dL Final  ?01/15/2014 11 7 - 18 mg/dL Final  ? ?Calcium  ?Date Value Ref Range Status  ?06/23/2021 9.3 8.7 - 10.3 mg/dL Final  ? ?Calcium, Total  ?Date Value Ref Range Status  ?01/15/2014 9.2 8.5 - 10.1 mg/dL Final  ? ?CO2  ?Date Value Ref Range Status  ?06/23/2021 24 20 - 29 mmol/L Final  ? ?Co2  ?Date Value Ref Range Status  ?01/15/2014 26 21 - 32 mmol/L Final  ? ?Bicarbonate  ?Date Value Ref Range Status  ?12/03/2019 29.0 (H) 20.0 - 28.0 mmol/L Final  ? ?Creatinine  ?Date Value Ref Range Status  ?01/15/2014 0.52 (L) 0.60 - 1.30 mg/dL Final  ? ?Creatinine, Ser  ?Date Value Ref Range Status  ?06/23/2021 0.99 0.57 - 1.00 mg/dL Final  ? ?Glucose  ?Date Value Ref Range Status  ?06/23/2021 89 70 - 99 mg/dL Final  ?01/15/2014 270 (H) 65 - 99 mg/dL Final  ? ?Glucose, Bld  ?Date Value Ref Range Status  ?03/08/2020 118 (H) 70 - 99 mg/dL  Final  ?  Comment:  ?  Glucose reference range applies only to samples taken after fasting for at least 8 hours.  ? ?Glucose-Capillary  ?Date Value Ref Range Status  ?09/10/2021 83 70 - 99 mg/dL Final  ?  Comment:  ?  Glucose reference range applies only to samples taken after fasting for at least 8 hours.  ? ?Potassium  ?Date Value Ref Range Status  ?06/23/2021 3.8 3.5 - 5.2 mmol/L Final  ?01/15/2014 4.2 3.5 - 5.1 mmol/L Final  ? ?Sodium  ?Date Value Ref Range Status  ?06/23/2021 140 134 - 144 mmol/L Final  ?01/15/2014 134 (L) 136 - 145 mmol/L Final  ? ?Bilirubin,Total  ?Date Value Ref Range Status  ?01/15/2014 0.4 0.2 - 1.0 mg/dL Final  ? ?Bilirubin Total  ?Date Value Ref Range Status  ?06/23/2021 0.2 0.0 - 1.2 mg/dL Final  ? ?Bilirubin, Direct  ?Date Value Ref Range Status  ?09/08/2018 0.3 (H) 0.0 - 0.2 mg/dL Final  ? ?Indirect Bilirubin  ?Date Value Ref Range Status  ?09/08/2018 0.6 0.3 - 0.9 mg/dL Final  ?  Comment:  ?  Performed at [facility], [address], [place], [place] [address]  ? ?Protein, ur  ?Date Value Ref Range Status  ?12/03/2019 100 (A) NEGATIVE mg/dL Final  ? ?Protein,UA  ?Date Value Ref Range Status  ?06/23/2021 Trace (A) Negative/Trace Final  ? ?Total Protein  ?Date Value Ref Range Status  ?06/23/2021 6.1 6.0 - 8.5 g/dL Final  ?01/15/2014 7.3 6.4 - 8.2 g/dL Final  ? ?EGFR (African American)  ?Date Value Ref Range Status  ?01/15/2014 >60  Final  ? ?GFR calc Af Wyvonnia Lora  ?Date Value Ref Range Status  ?09/24/2020 83 >59 mL/min/1.73 Final  ?  Comment:  ?  **In accordance with recommendations from the NKF-ASN Task force,** ?  [facility] is in the process of updating its eGFR calculation to the ?  2021 CKD-EPI creatinine equation that estimates kidney function ?  without a race variable. ?  ? ?eGFR  ?  Date Value Ref Range Status  ?06/23/2021 61 >59 mL/min/1.73 Final  ? ?EGFR (Non-African Amer.)  ?Date Value Ref Range Status  ?01/15/2014 >60  Final  ?  Comment:  ?  eGFR values <72m/min/1.73 m2  may be an indication of chronic ?kidney disease (CKD). ?Calculated eGFR is useful in patients with stable renal function. ?The eGFR calculation will not be reliable in acutely ill patients ?when serum creatini

## 2022-01-21 ENCOUNTER — Ambulatory Visit: Payer: Medicare Other | Admitting: Gastroenterology

## 2022-02-03 ENCOUNTER — Telehealth: Payer: Self-pay

## 2022-02-03 NOTE — Chronic Care Management (AMB) (Cosign Needed)
Chronic Care Management Pharmacy Assistant   Name: Sarah White  MRN: [id number] DOB: Oct 15, 1945  Reason for Encounter: Disease State General   Recent office visits:  None noted  Recent consult visits:  None noted  Hospital visits:  None in previous 6 months  Medications: Outpatient Encounter Medications as of 02/03/2022  Medication Sig Note   albuterol (ACCUNEB) 0.63 MG/3ML nebulizer solution Take 3 mLs (0.63 mg total) by nebulization every 6 (six) hours as needed for wheezing.    albuterol (VENTOLIN HFA) 108 (90 Base) MCG/ACT inhaler Inhale 2 puffs into the lungs every 6 (six) hours as needed for wheezing or shortness of breath. 04/06/2020: Taking 2 puffs BID   amiodarone (PACERONE) 200 MG tablet Take 0.5 tablets by mouth daily.    aspirin EC 81 MG tablet Take 81 mg by mouth at bedtime.     buPROPion (WELLBUTRIN XL) 150 MG 24 hr tablet Take by mouth.    carvedilol (COREG) 12.5 MG tablet Take 12.5 mg by mouth daily.    clopidogrel (PLAVIX) 75 MG tablet Take 1 tablet (75 mg total) by mouth daily at 6 (six) AM.    cyclobenzaprine (FLEXERIL) 10 MG tablet TAKE 1 TABLET BY MOUTH THREE TIMES DAILY AS NEEDED FOR MUSCLE SPASM. DO NOT DRIVE WHILE ON MEDICINE. WILL MAKE YOU SLEEPY    diphenhydrAMINE (BENADRYL) 25 MG tablet Take 25 mg by mouth at bedtime as needed for allergies. Dies not take regularly 04/06/2020: Taking 1 QPM   donepezil (ARICEPT) 10 MG tablet TAKE 1 TABLET BY MOUTH NIGHTLY FOR 30 DAYS    furosemide (LASIX) 40 MG tablet Take 1 tablet (40 mg total) by mouth daily.    Glycopyrrolate-Formoterol (BEVESPI AEROSPHERE) 9-4.8 MCG/ACT AERO Inhale 2 puffs into the lungs 2 (two) times daily. 12/22/2021: Patient assistance   isosorbide mononitrate (IMDUR) 30 MG 24 hr tablet Take 1 tablet (30 mg total) by mouth daily.    nitroGLYCERIN (NITROSTAT) 0.4 MG SL tablet DISSOLVE ONE TABLET UNDER THE TONGUE EVERY 5 MINUTES AS NEEDED FOR CHEST PAIN.  DO NOT EXCEED A TOTAL OF 3 DOSES IN 15 MINUTES     pantoprazole (PROTONIX) 40 MG tablet Take 1 tablet (40 mg total) by mouth 2 (two) times daily before a meal.    QUEtiapine (SEROQUEL XR) 50 MG TB24 24 hr tablet TAKE 2 TABLETS BY MOUTH AT BEDTIME    rosuvastatin (CRESTOR) 20 MG tablet Take 1 tablet (20 mg total) by mouth daily.    sacubitril-valsartan (ENTRESTO) 49-51 MG Take 1 tablet by mouth 2 (two) times daily.     sertraline (ZOLOFT) 100 MG tablet Take 2 tablets (200 mg total) by mouth daily.    silver sulfADIAZINE (SILVADENE) 1 % cream Apply 1 application topically daily. (Patient taking differently: Apply 1 application. topically as needed.)    traZODone (DESYREL) 50 MG tablet TAKE 1 TABLET BY MOUTH AT BEDTIME AS NEEDED FOR SLEEP    triamcinolone ointment (KENALOG) 0.5 % Apply 1 application topically 2 (two) times daily.    No facility-administered encounter medications on file as of 02/03/2022.   Contacted Sarah White for General Review Call   Chart Review:  Have there been any documented new, changed, or discontinued medications since last visit? No (If yes, include name, dose, frequency, date)  Has there been any documented recent hospitalizations or ED visits since last visit with Clinical Pharmacist? No  Unsuccessful attempts to complete assessment call. I have called patient 3x and left 3 voicemail's for the  patient to return my call when available.   Care Gaps: Zoster Vaccines:Never done OPHTHALMOLOGY EXAM:Last completed: Sep 17, 2015 FOOT EXAM:Last completed: Jan 18, 2020 HEMOGLOBIN A1C:Last completed: Jun 23, 2021  Star Rating Drugs: Rosuvastatin 20 mg Last filled:09/30/21 90 DS Donepezil 10 mg Last filled:01/07/22 30 DS Sacubitril-valsartan 49-51 mg Last filled:07/09/20 30 DS  Sarah White, [place]

## 2022-02-11 ENCOUNTER — Other Ambulatory Visit: Payer: Self-pay | Admitting: Family Medicine

## 2022-02-12 NOTE — Telephone Encounter (Signed)
Requested Prescriptions  Pending Prescriptions Disp Refills  . cyclobenzaprine (FLEXERIL) 10 MG tablet [Pharmacy Med Name: Cyclobenzaprine HCl 10 MG Oral Tablet] 30 tablet 0    Sig: TAKE 1 TABLET BY MOUTH THREE TIMES DAILY AS NEEDED FOR MUSCLE SPASM -  DO  NOT  DRIVE  WHILE  TAKING  (CAUSES  DROWSINESS)     Not Delegated - Analgesics:  Muscle Relaxants Failed - 02/11/2022  6:45 PM      Failed - This refill cannot be delegated      Passed - Valid encounter within last 6 months    Recent Outpatient Visits          2 months ago Orthostatic hypotension   [place], [name], DO   3 months ago Hypotension due to drugs   [employer], [name], DO   6 months ago Depression, major, single episode, moderate ([place])   [place], [name], DO   7 months ago Essential (primary) hypertension   [facility] [place], [place], DO   1 year ago Diarrhea of presumed infectious origin   [employer], [place], DO      Future Appointments            In 2 months [place], [facility]            . traZODone (DESYREL) 50 MG tablet [Pharmacy Med Name: traZODone HCl 50 MG Oral Tablet] 90 tablet 0    Sig: TAKE 1 TABLET BY MOUTH AT BEDTIME AS NEEDED FOR SLEEP     Psychiatry: Antidepressants - Serotonin Modulator Passed - 02/11/2022  6:45 PM      Passed - Completed PHQ-2 or PHQ-9 in the last 360 days      Passed - Valid encounter within last 6 months    Recent Outpatient Visits          2 months ago Orthostatic hypotension   [facility] [place], [name], DO   3 months ago Hypotension due to drugs   [place] P, DO   6 months ago Depression, major, single episode, moderate ([place])   [place], [name], DO   7 months ago Essential (primary) hypertension   [place], [place], DO   1 year ago Diarrhea of presumed  infectious origin   [employer], [place], DO      Future Appointments            In 2 months [employer], [place]            Requested early via Engineer, structural. Refill available

## 2022-02-12 NOTE — Telephone Encounter (Signed)
Requested medication (s) are due for refill today: yes  Requested medication (s) are on the active medication list: yes    Last refill: 01/08/22  #30  0 refills  Future visit scheduled no  Notes to clinic:Not delegated, please review. Thank you.  Requested Prescriptions  Pending Prescriptions Disp Refills   cyclobenzaprine (FLEXERIL) 10 MG tablet [Pharmacy Med Name: Cyclobenzaprine HCl 10 MG Oral Tablet] 30 tablet 0    Sig: TAKE 1 TABLET BY MOUTH THREE TIMES DAILY AS NEEDED FOR MUSCLE SPASM -  DO  NOT  DRIVE  WHILE  TAKING  (CAUSES  DROWSINESS)     Not Delegated - Analgesics:  Muscle Relaxants Failed - 02/11/2022  6:45 PM      Failed - This refill cannot be delegated      Passed - Valid encounter within last 6 months    Recent Outpatient Visits           2 months ago Orthostatic hypotension   [place], [name], DO   3 months ago Hypotension due to drugs   [employer], [name], DO   6 months ago Depression, major, single episode, moderate ([place])   [place], [name], DO   7 months ago Essential (primary) hypertension   [facility] [employer], [place], DO   1 year ago Diarrhea of presumed infectious origin   [employer], [place], DO       Future Appointments             In 2 months [place], [facility]             Refused Prescriptions Disp Refills   traZODone (DESYREL) 50 MG tablet [Pharmacy Med Name: traZODone HCl 50 MG Oral Tablet] 90 tablet 0    Sig: TAKE 1 TABLET BY MOUTH AT BEDTIME AS NEEDED FOR SLEEP     Psychiatry: Antidepressants - Serotonin Modulator Passed - 02/11/2022  6:45 PM      Passed - Completed PHQ-2 or PHQ-9 in the last 360 days      Passed - Valid encounter within last 6 months    Recent Outpatient Visits           2 months ago Orthostatic hypotension   [facility] [place], [name], DO   3 months ago Hypotension due to drugs    [employer], [name], DO   6 months ago Depression, major, single episode, moderate ([place])   [place], [name], DO   7 months ago Essential (primary) hypertension   [facility] [place], [place], DO   1 year ago Diarrhea of presumed infectious origin   [employer], [place], DO       Future Appointments             In 2 months [employer], [place]

## 2022-02-23 ENCOUNTER — Other Ambulatory Visit: Payer: Self-pay | Admitting: Nurse Practitioner

## 2022-03-16 ENCOUNTER — Telehealth: Payer: Self-pay

## 2022-03-16 ENCOUNTER — Other Ambulatory Visit: Payer: Self-pay | Admitting: Family Medicine

## 2022-03-16 NOTE — Chronic Care Management (AMB) (Signed)
Chronic Care Management Pharmacy Assistant   Name: Sarah White  MRN: [id number] DOB: 1946-08-12   Reason for Encounter: Disease State General    Recent office visits:  11/25/21-Megan Annia Friendly, DO (PCP) Seen for hypertension. Increase Imdur from 30 mg to 60 mg. Follow up in 3-4 weeks.   Recent consult visits:  None noted  Hospital visits:  None in previous 6 months  Medications: Outpatient Encounter Medications as of 03/16/2022  Medication Sig Note   albuterol (ACCUNEB) 0.63 MG/3ML nebulizer solution Take 3 mLs (0.63 mg total) by nebulization every 6 (six) hours as needed for wheezing.    albuterol (VENTOLIN HFA) 108 (90 Base) MCG/ACT inhaler Inhale 2 puffs into the lungs every 6 (six) hours as needed for wheezing or shortness of breath. 04/20/20: Taking 2 puffs BID   amiodarone (PACERONE) 200 MG tablet Take 0.5 tablets by mouth daily.    aspirin EC 81 MG tablet Take 81 mg by mouth at bedtime.     buPROPion (WELLBUTRIN XL) 150 MG 24 hr tablet Take by mouth.    carvedilol (COREG) 12.5 MG tablet Take 12.5 mg by mouth daily.    clopidogrel (PLAVIX) 75 MG tablet Take 1 tablet (75 mg total) by mouth daily at 6 (six) AM.    cyclobenzaprine (FLEXERIL) 10 MG tablet TAKE 1 TABLET BY MOUTH THREE TIMES DAILY AS NEEDED FOR MUSCLE SPASM -  DO  NOT  DRIVE  WHILE  TAKING  (CAUSES  DROWSINESS)    diphenhydrAMINE (BENADRYL) 25 MG tablet Take 25 mg by mouth at bedtime as needed for allergies. Dies not take regularly April 20, 2020: Taking 1 QPM   donepezil (ARICEPT) 10 MG tablet TAKE 1 TABLET BY MOUTH NIGHTLY FOR 30 DAYS    furosemide (LASIX) 40 MG tablet Take 1 tablet (40 mg total) by mouth daily.    Glycopyrrolate-Formoterol (BEVESPI AEROSPHERE) 9-4.8 MCG/ACT AERO Inhale 2 puffs into the lungs 2 (two) times daily. 12/22/2021: Patient assistance   isosorbide mononitrate (IMDUR) 30 MG 24 hr tablet Take 1 tablet (30 mg total) by mouth daily.    nitroGLYCERIN (NITROSTAT) 0.4 MG SL tablet DISSOLVE ONE  TABLET UNDER THE TONGUE EVERY 5 MINUTES AS NEEDED FOR CHEST PAIN.  DO NOT EXCEED A TOTAL OF 3 DOSES IN 15 MINUTES    pantoprazole (PROTONIX) 40 MG tablet Take 1 tablet (40 mg total) by mouth 2 (two) times daily before a meal.    QUEtiapine (SEROQUEL XR) 50 MG TB24 24 hr tablet TAKE 2 TABLETS BY MOUTH AT BEDTIME    rosuvastatin (CRESTOR) 20 MG tablet Take 1 tablet (20 mg total) by mouth daily.    sacubitril-valsartan (ENTRESTO) 49-51 MG Take 1 tablet by mouth 2 (two) times daily.     sertraline (ZOLOFT) 100 MG tablet Take 2 tablets (200 mg total) by mouth daily.    silver sulfADIAZINE (SILVADENE) 1 % cream Apply 1 application topically daily. (Patient taking differently: Apply 1 application. topically as needed.)    traZODone (DESYREL) 50 MG tablet TAKE 1 TABLET BY MOUTH AT BEDTIME AS NEEDED FOR SLEEP    triamcinolone ointment (KENALOG) 0.5 % Apply 1 application topically 2 (two) times daily.    No facility-administered encounter medications on file as of 03/16/2022.   Contacted [name] for General Review Call   Chart Review:  Have there been any documented new, changed, or discontinued medications since last visit? Yes (If yes, include name, dose, frequency, date) 11/25/21-Megan Annia Friendly, DO (PCP) Seen for hypertension. Increase  Imdur from 30 mg to 60 mg.  Has there been any documented recent hospitalizations or ED visits since last visit with Clinical Pharmacist? No    Adherence Review:  Does the Clinical Pharmacist Assistant have access to adherence rates? Yes  Does the patient have >5 day gap between last estimated fill dates for any of the above medications or other medication gaps? Yes    Disease State Questions:  Able to connect with Patient? Yes  Did patient have any problems with their health recently? Yes Note problems and Concerns:Patient states her blood pressure has been low 107/59. Patient states she has been eating some pretzels to elevate it which was  advised by her PCP.  Have you had any admissions or emergency room visits or worsening of your condition(s) since last visit? No Details of ED visit, hospital visit and/or worsening condition(s):N/a  Have you had any visits with new specialists or providers since your last visit? No Explain:N/A  Have you had any new health care problem(s) since your last visit? Yes New problem(s) reported:Patient states her blood pressure has been low. Patient states she has been eating some pretzels to elevate it which was advised by her PCP.  Have you run out of any of your medications since you last spoke with clinical pharmacist? No What caused you to run out of your medications?  Are there any medications you are not taking as prescribed? No What kept you from taking your medications as prescribed?  Are you having any issues or side effects with your medications? No Note of issues or side effects:  Do you have any other health concerns or questions you want to discuss with your Clinical Pharmacist before your next visit? No Note additional concerns and questions from Patient.  Are there any health concerns that you feel we can do a better job addressing? No Note Patient's response.  Are you having any problems with any of the following since the last visit: (select all that apply)  Ambulating/Walking  Details:Patient states she does have walkers but doesn't remember to use them often.  12. Any falls since last visit? Yes  Details:Patient states she has fallen 2-3 times the latest about 1 month ago. Patient states she does have a walkers but doesn't remember to use them.  13. Any increased or uncontrolled pain since last visit? No  Details:  14. Next visit Type: office       Visit with:        Date:        Time:  1. Additional Details? No    Care Gaps: Zoster Vaccines:Never done OPHTHALMOLOGY EXAM:Last completed: Sep 17, 2015 FOOT EXAM:Last completed: Jan 18, 2020 HEMOGLOBIN  A1C:Last completed: Jun 23, 2021  Star Rating Drugs: Carvedilol 12.5 mg Last filled:10/08/21 90 DS Donepezil 10 mg Last filled:02/12/22 30 DS Rosuvastatin 20 mg Last filled:09/30/21 90 DS Sacubitril-valsartan 49-51 mg Last filled:07/09/20 30 DS  Myriam Elta [place], [place]

## 2022-03-17 NOTE — Telephone Encounter (Signed)
Requested medications are due for refill today.  yes  Requested medications are on the active medications list.  yes  Last refill. 02/12/2022 #30 0 refills  Future visit scheduled.   yes  Notes to clinic.  Refill not delegated.    Requested Prescriptions  Pending Prescriptions Disp Refills   cyclobenzaprine (FLEXERIL) 10 MG tablet [Pharmacy Med Name: Cyclobenzaprine HCl 10 MG Oral Tablet] 30 tablet 0    Sig: Take 1 tablet by mouth three times daily as needed for muscle spasm     Not Delegated - Analgesics:  Muscle Relaxants Failed - 03/16/2022  9:30 AM      Failed - This refill cannot be delegated      Passed - Valid encounter within last 6 months    Recent Outpatient Visits           3 months ago Orthostatic hypotension   [place], [name], DO   4 months ago Hypotension due to drugs   [facility], [name], DO   7 months ago Depression, major, single episode, moderate ([place])   [place], [name], DO   8 months ago Essential (primary) hypertension   [place], [place], DO   1 year ago Diarrhea of presumed infectious origin   [employer], [place], DO       Future Appointments             In 1 month [place], [facility]

## 2022-03-20 ENCOUNTER — Telehealth: Payer: Self-pay | Admitting: Family Medicine

## 2022-03-20 NOTE — Telephone Encounter (Signed)
Pt is calling for advice appt made for 03/27/22. Pt was last seen in 01/23. Pt would like to know are labs needed prior to the appt. Please advise

## 2022-03-23 NOTE — Telephone Encounter (Signed)
LVM letting patient know that she does not have any lab orders in and any labs that Dr. Wynetta Emery will need she can get at her appointment

## 2022-03-26 DIAGNOSIS — I700 Atherosclerosis of aorta: Secondary | ICD-10-CM | POA: Diagnosis not present

## 2022-03-26 DIAGNOSIS — I959 Hypotension, unspecified: Secondary | ICD-10-CM | POA: Diagnosis not present

## 2022-03-26 DIAGNOSIS — R918 Other nonspecific abnormal finding of lung field: Secondary | ICD-10-CM | POA: Diagnosis not present

## 2022-03-26 DIAGNOSIS — E782 Mixed hyperlipidemia: Secondary | ICD-10-CM | POA: Diagnosis not present

## 2022-03-26 DIAGNOSIS — Z79899 Other long term (current) drug therapy: Secondary | ICD-10-CM | POA: Diagnosis not present

## 2022-03-26 DIAGNOSIS — J984 Other disorders of lung: Secondary | ICD-10-CM | POA: Diagnosis not present

## 2022-03-26 DIAGNOSIS — Z95828 Presence of other vascular implants and grafts: Secondary | ICD-10-CM | POA: Diagnosis not present

## 2022-03-26 DIAGNOSIS — I4729 Other ventricular tachycardia: Secondary | ICD-10-CM | POA: Diagnosis not present

## 2022-03-26 DIAGNOSIS — E876 Hypokalemia: Secondary | ICD-10-CM | POA: Diagnosis not present

## 2022-03-26 DIAGNOSIS — J9621 Acute and chronic respiratory failure with hypoxia: Secondary | ICD-10-CM | POA: Diagnosis not present

## 2022-03-26 DIAGNOSIS — E119 Type 2 diabetes mellitus without complications: Secondary | ICD-10-CM | POA: Diagnosis not present

## 2022-03-26 DIAGNOSIS — I10 Essential (primary) hypertension: Secondary | ICD-10-CM | POA: Diagnosis not present

## 2022-03-26 DIAGNOSIS — I25118 Atherosclerotic heart disease of native coronary artery with other forms of angina pectoris: Secondary | ICD-10-CM | POA: Diagnosis not present

## 2022-03-26 DIAGNOSIS — J449 Chronic obstructive pulmonary disease, unspecified: Secondary | ICD-10-CM | POA: Diagnosis not present

## 2022-03-26 DIAGNOSIS — I5020 Unspecified systolic (congestive) heart failure: Secondary | ICD-10-CM | POA: Diagnosis not present

## 2022-03-26 DIAGNOSIS — R0789 Other chest pain: Secondary | ICD-10-CM | POA: Diagnosis not present

## 2022-03-26 DIAGNOSIS — R079 Chest pain, unspecified: Secondary | ICD-10-CM | POA: Diagnosis not present

## 2022-03-27 ENCOUNTER — Encounter: Payer: Self-pay | Admitting: Family Medicine

## 2022-03-27 ENCOUNTER — Ambulatory Visit (INDEPENDENT_AMBULATORY_CARE_PROVIDER_SITE_OTHER): Payer: Medicare Other | Admitting: Family Medicine

## 2022-03-27 VITALS — BP 103/69 | HR 69 | Temp 98.3°F | Wt 159.6 lb

## 2022-03-27 DIAGNOSIS — F321 Major depressive disorder, single episode, moderate: Secondary | ICD-10-CM

## 2022-03-27 DIAGNOSIS — N183 Chronic kidney disease, stage 3 unspecified: Secondary | ICD-10-CM | POA: Diagnosis not present

## 2022-03-27 DIAGNOSIS — I200 Unstable angina: Secondary | ICD-10-CM

## 2022-03-27 DIAGNOSIS — E782 Mixed hyperlipidemia: Secondary | ICD-10-CM | POA: Diagnosis not present

## 2022-03-27 DIAGNOSIS — M25552 Pain in left hip: Secondary | ICD-10-CM

## 2022-03-27 DIAGNOSIS — I700 Atherosclerosis of aorta: Secondary | ICD-10-CM | POA: Diagnosis not present

## 2022-03-27 DIAGNOSIS — I129 Hypertensive chronic kidney disease with stage 1 through stage 4 chronic kidney disease, or unspecified chronic kidney disease: Secondary | ICD-10-CM

## 2022-03-27 DIAGNOSIS — E1122 Type 2 diabetes mellitus with diabetic chronic kidney disease: Secondary | ICD-10-CM

## 2022-03-27 DIAGNOSIS — I2510 Atherosclerotic heart disease of native coronary artery without angina pectoris: Secondary | ICD-10-CM

## 2022-03-27 DIAGNOSIS — J449 Chronic obstructive pulmonary disease, unspecified: Secondary | ICD-10-CM

## 2022-03-27 LAB — MICROALBUMIN, URINE WAIVED
Creatinine, Urine Waived: 200 mg/dL (ref 10–300)
Microalb, Ur Waived: 80 mg/L — ABNORMAL HIGH (ref 0–19)
Microalb/Creat Ratio: <30 mg/g (ref <30)

## 2022-03-27 LAB — BAYER DCA HB A1C WAIVED: HB A1C (BAYER DCA - WAIVED): 5.2 % (ref 4.8–5.6)

## 2022-03-27 MED ORDER — FUROSEMIDE 40 MG PO TABS
40.0000 mg | ORAL_TABLET | Freq: Every day | ORAL | 1 refills | Status: DC
Start: 2022-03-27 — End: 2022-09-14

## 2022-03-27 MED ORDER — ISOSORBIDE MONONITRATE ER 30 MG PO TB24
30.0000 mg | ORAL_TABLET | Freq: Every day | ORAL | 1 refills | Status: DC
Start: 2022-03-27 — End: 2022-05-28

## 2022-03-27 MED ORDER — TRAZODONE HCL 50 MG PO TABS
50.0000 mg | ORAL_TABLET | Freq: Every evening | ORAL | 1 refills | Status: DC | PRN
Start: 2022-03-27 — End: 2022-08-21

## 2022-03-27 MED ORDER — SERTRALINE HCL 100 MG PO TABS
200.0000 mg | ORAL_TABLET | Freq: Every day | ORAL | 1 refills | Status: DC
Start: 2022-03-27 — End: 2022-09-14

## 2022-03-27 MED ORDER — CLOPIDOGREL BISULFATE 75 MG PO TABS: 75.0000 mg | ORAL_TABLET | Freq: Every day | ORAL | 1 refills | Status: DC

## 2022-03-27 MED ORDER — QUETIAPINE FUMARATE ER 50 MG PO TB24
100.0000 mg | ORAL_TABLET | Freq: Every day | ORAL | 1 refills | Status: DC
Start: 2022-03-27 — End: 2022-09-14

## 2022-03-27 MED ORDER — LIDOCAINE 5 % EX PTCH: 1.0000 | MEDICATED_PATCH | CUTANEOUS | 12 refills | Status: DC

## 2022-03-27 MED ORDER — BUPROPION HCL ER (XL) 300 MG PO TB24: 300.0000 mg | ORAL_TABLET | Freq: Every day | ORAL | 1 refills | Status: DC

## 2022-03-27 NOTE — Assessment & Plan Note (Signed)
Under good control on current regimen. Continue current regimen. Continue to monitor. Call with any concerns. Refills given. Labs drawn today.   

## 2022-03-27 NOTE — Assessment & Plan Note (Signed)
Will keep BP and cholesterol under good control. Continue to follow with cardiology. Call with any concerns.  

## 2022-03-27 NOTE — Assessment & Plan Note (Signed)
Not doing well. Will increase her wellbutrin to '300mg'$  and recheck 3 months. Continue zoloft and seroquel. Call with any concerns. Continue to monitor.

## 2022-03-27 NOTE — Assessment & Plan Note (Signed)
Doing great with A1c of 5.2. Not on medication. Continue to monitor. Call with any concerns.

## 2022-03-27 NOTE — Progress Notes (Signed)
BP 103/69   Pulse 69   Temp 98.3 F (36.8 C) (Oral)   Wt 159 lb 9.6 oz (72.4 kg)   SpO2 94%   BMI 27.40 kg/m    Subjective:    [name], female    DOB: December 17, 1945, 76 y.o.   MRN: [id number]  HPI: Sarah White is a 76 y.o. female  Chief Complaint  Patient presents with   Hip Pain     States she has a "crunchy" place in her L hip. Reports recent fall 3 days ago.    Diabetes   Hypertension   Hyperlipidemia   HIP PAIN Duration: 3 days Involved hip: Left  Mechanism of injury:  fall Location: lateral Onset: sudden  Severity: moderate  Quality: aching and crunching Frequency: with movement  Radiation: no Aggravating factors: weight bearing, walking, running, bending, and movement   Alleviating factors: nothing Status: stable Treatments attempted: rest, ice, heat, and APAP   Relief with NSAIDs?: No NSAIDs Taken Weakness with weight bearing: no Weakness with walking: no Paresthesias / decreased sensation: no Swelling: no Redness:no Fevers: no  DIABETES Hypoglycemic episodes:no Polydipsia/polyuria: no Visual disturbance: no Chest pain: no Paresthesias: no Glucose Monitoring: no  Accucheck frequency: Not Checking Taking Insulin?: no Blood Pressure Monitoring: not checking Retinal Examination: Not up to Date Foot Exam: Up to Date Diabetic Education: Completed Pneumovax: Up to Date Influenza: Up to Date Aspirin: yes  HYPERTENSION / HYPERLIPIDEMIA Satisfied with current treatment? yes Duration of hypertension: chronic BP monitoring frequency: not checking BP medication side effects: no Duration of hyperlipidemia: chronic Cholesterol medication side effects: no Cholesterol supplements: none Past cholesterol medications: crestor Medication compliance: excellent compliance Aspirin: yes Recent stressors: no Recurrent headaches: no Visual changes: no Palpitations: no Dyspnea: no Chest pain: no Lower extremity edema:  no Dizzy/lightheaded: no  DEPRESSION Mood status: uncontrolled Satisfied with current treatment?: no Symptom severity: moderate  Duration of current treatment : chronic Side effects: no Medication compliance: good compliance Psychotherapy/counseling: no  Previous psychiatric medications: zoloft, seroquel and wellbutrin Depressed mood: yes Anxious mood: yes Anhedonia: yes Significant weight loss or gain: no Insomnia: no  Fatigue: yes Feelings of worthlessness or guilt: yes Impaired concentration/indecisiveness: yes Suicidal ideations: no Hopelessness: no Crying spells: yes    03/27/2022    1:26 PM 11/25/2021    1:30 PM 10/23/2021    2:04 PM 10/23/2021    1:55 PM 07/21/2021    2:52 PM  Depression screen PHQ 2/9  Decreased Interest 1 0 '1 1 2  '$ Down, Depressed, Hopeless 1 0 '1 1 2  '$ PHQ - 2 Score 2 0 '2 2 4  '$ Altered sleeping 0 0 0 1 1  Tired, decreased energy 1 0 '1 3 1  '$ Change in appetite 0 0 0 0 1  Feeling bad or failure about yourself  '2 1 1 '$ 0 1  Trouble concentrating 0 0 0 0 1  Moving slowly or fidgety/restless 0 0 0 0 1  Suicidal thoughts 0 0 0 0 0  PHQ-9 Score '5 1 4 6 10     '$ Relevant past medical, surgical, family and social history reviewed and updated as indicated. Interim medical history since our last visit reviewed. Allergies and medications reviewed and updated.  Review of Systems  Constitutional: Negative.   HENT: Negative.    Respiratory: Negative.    Cardiovascular: Negative.   Gastrointestinal: Negative.   Musculoskeletal:  Positive for arthralgias and myalgias. Negative for back pain, gait problem, joint swelling, neck pain  and neck stiffness.  Skin: Negative.   Psychiatric/Behavioral:  Positive for dysphoric mood. Negative for agitation, behavioral problems, confusion, decreased concentration, hallucinations, self-injury, sleep disturbance and suicidal ideas. The patient is nervous/anxious. The patient is not hyperactive.     Per HPI unless specifically  indicated above     Objective:    BP 103/69   Pulse 69   Temp 98.3 F (36.8 C) (Oral)   Wt 159 lb 9.6 oz (72.4 kg)   SpO2 94%   BMI 27.40 kg/m   Wt Readings from Last 3 Encounters:  03/27/22 159 lb 9.6 oz (72.4 kg)  11/25/21 161 lb 3.2 oz (73.1 kg)  10/23/21 150 lb (68 kg)    Physical Exam Vitals and nursing note reviewed.  Constitutional:      General: She is not in acute distress.    Appearance: Normal appearance. She is not ill-appearing, toxic-appearing or diaphoretic.  HENT:     Head: Normocephalic and atraumatic.     Right Ear: External ear normal.     Left Ear: External ear normal.     Nose: Nose normal.     Mouth/Throat:     Mouth: Mucous membranes are moist.     Pharynx: Oropharynx is clear.  Eyes:     General: No scleral icterus.       Right eye: No discharge.        Left eye: No discharge.     Extraocular Movements: Extraocular movements intact.     Conjunctiva/sclera: Conjunctivae normal.     Pupils: Pupils are equal, round, and reactive to light.  Cardiovascular:     Rate and Rhythm: Normal rate and regular rhythm.     Pulses: Normal pulses.     Heart sounds: Normal heart sounds. No murmur heard.    No friction rub. No gallop.  Pulmonary:     Effort: Pulmonary effort is normal. No respiratory distress.     Breath sounds: Normal breath sounds. No stridor. No wheezing, rhonchi or rales.  Chest:     Chest wall: No tenderness.  Musculoskeletal:        General: Normal range of motion.     Cervical back: Normal range of motion and neck supple.  Skin:    General: Skin is warm and dry.     Capillary Refill: Capillary refill takes less than 2 seconds.     Coloration: Skin is not jaundiced or pale.     Findings: No bruising, erythema, lesion or rash.  Neurological:     General: No focal deficit present.     Mental Status: She is alert and oriented to person, place, and time. Mental status is at baseline.  Psychiatric:        Mood and Affect: Mood normal.         Behavior: Behavior normal.        Thought Content: Thought content normal.        Judgment: Judgment normal.     Results for orders placed or performed during the hospital encounter of 09/10/21  Glucose, capillary  Result Value Ref Range   Glucose-Capillary 83 70 - 99 mg/dL  Surgical pathology  Result Value Ref Range   SURGICAL PATHOLOGY      SURGICAL PATHOLOGY CASE: [id number] PATIENT: Chip Boer Surgical Pathology Report     Specimen Submitted: A. Duodenum; cbx B. Stomach; cbx C. Colon polyp x2, cecum; hot(1) cold(1) snare D. Colon, random; cbx E. Colon polyp x2, ascending; cold snare F. Colon polyp x3,  tran; hot(1) cold(2) snare G. Colon polyp x2, descending; cold snare  Clinical History: Esophageal dysphagia R13.19 chronic diarrhea of unknown origin K52.9.  Gastric ulcer; gastric erosions, colon polyps, diverticulosis      DIAGNOSIS: A. DUODENUM; COLD BIOPSY: - DUODENAL MUCOSA WITH NO SIGNIFICANT PATHOLOGIC ALTERATION. - NEGATIVE FOR FEATURES OF CELIAC DISEASE. - NEGATIVE FOR INTESTINAL METAPLASIA, DYSPLASIA, AND MALIGNANCY.  B. STOMACH; COLD BIOPSY: - MILD CHRONIC GASTRITIS. - NEGATIVE FOR ACTIVE INFLAMMATION AND H PYLORI. - NEGATIVE FOR INTESTINAL METAPLASIA, DYSPLASIA, AND MALIGNANCY.  C. COLON POLYP X2, CECUM; HOT AND COLD SNARE: - TUBULAR ADENOMA, MULTIPLE FRAGMENTS. - NEG ATIVE FOR HIGH GRADE DYSPLASIA AND MALIGNANCY.  D. COLON, RANDOM; COLD BIOPSY: - COLONIC MUCOSA WITH NO SIGNIFICANT PATHOLOGIC ALTERATION. - NEGATIVE FOR ACTIVE INFLAMMATION AND FEATURES OF CHRONICITY. - NEGATIVE FOR MICROSCOPIC COLITIS, DYSPLASIA, AND MALIGNANCY.  E. COLON POLYP X2, ASCENDING; COLD SNARE: - TUBULAR ADENOMA, MULTIPLE FRAGMENTS. - NEGATIVE FOR HIGH GRADE DYSPLASIA AND MALIGNANCY.  F. COLON POLYP X3, TRANSVERSE; HOT AND COLD SNARE: - TUBULAR ADENOMA, MULTIPLE FRAGMENTS. - NEGATIVE FOR HIGH GRADE DYSPLASIA AND MALIGNANCY.  G. COLON POLYP X2,  DESCENDING; COLD SNARE: - TUBULAR ADENOMA, MULTIPLE FRAGMENTS. - NEGATIVE FOR HIGH GRADE DYSPLASIA AND MALIGNANCY.   GROSS DESCRIPTION: A. Labeled: cbx duodenum rule out celiac Received: Formalin Collection time: 9:02 AM on 09/10/2021 Placed into formalin time: 9:02 AM on 09/10/2021 Tissue fragment(s): 4 Size: Aggregate, 0.8 x 0.5 x 0.2 cm Description: Tan-pink soft tissue fragments Entirely submitted in 1 ca ssette.  B. Labeled: Gastric cbx rule out H. pylori Received: Formalin Collection time: 9:02 AM on 09/10/2021 Placed into formalin time: 9:02 AM on 09/10/2021 Tissue fragment(s): 1 Size: 0.5 x 0.3 x 0.2 cm Description: Tan soft tissue fragment Entirely submitted in 1 cassette.  C. Labeled: Hot snare x1/cold snare x1 cecum polyp Received: Formalin Collection time: 9:24 AM on 09/10/2021 Placed into formalin time: 9:24 AM on 09/10/2021 Tissue fragment(s): Multiple Size: Aggregate, 1.7 x 0.8 x 0.2 cm Description: Tan-pink soft tissue fragments Entirely submitted in 1 cassette.  D. Labeled: Random colon cbx Received: Formalin Collection time: 9:31 AM on 09/10/2021 Placed into formalin time: 9:31 AM on 09/10/2021 Tissue fragment(s): Multiple Size: Aggregate, 2.2 x 0.6 x 0.2 cm Description: Received are fragments of tan-pink soft tissue admixed with intestinal debris.  The ratio of soft tissue to intestinal debris is 90: 10. Entirely submitted in 1 cassette.  E. Label ed: Cold snare ascending colon polyp x2 Received: Formalin Collection time: 9:36 AM on 09/10/2021 Placed into formalin time: 9:36 AM on 09/10/2021 Tissue fragment(s): Multiple Size: Aggregate, 0.8 x 0.7 x 0.5 cm Description: Received are fragments of tan-pink soft tissue admixed with intestinal debris.  The ratio of soft tissue to intestinal debris is 90: 10.  The largest soft tissue fragment has a resection margin which is inked blue.  This fragment is trisected. Entirely submitted in cassettes 1-2 with the  trisected fragment in cassette 1 and the remaining fragments in cassette 2.  F. Labeled: Hot snare x1/cold snare x2 transverse colon polyp Received: Formalin Collection time: 9:41 AM on 09/10/2021 Placed into formalin time: 9:41 AM on 09/10/2021 Tissue fragment(s): Multiple Size: Aggregate, 1.4 x 0.9 x 0.2 cm Description: Tan-pink soft tissue fragments Entirely submitted in 1 cassette.  G. Labeled: Cold snare descending colon polyp x2 Received: Formalin Collecti on time: 9:43 AM on 09/10/2021 Placed into formalin time: 9:43 AM on 09/10/2021 Tissue fragment(s): Multiple Size: Aggregate, 1.7 x 0.5 x 0.2 cm Description: Received  are fragments of tan-pink soft tissue admixed with intestinal debris.  The ratio of soft tissue to intestinal debris is 95: 5. Entirely submitted in 1 cassette.  RB 09/10/2021  Final Diagnosis performed by Betsy Pries, MD.   Electronically signed 09/11/2021 12:00:42PM The electronic signature indicates that the named Attending Pathologist has evaluated the specimen Technical component performed at [facility], [address] [place], [place] [address] Lab: [phone] Dir: Rush Farmer, MD, MMM  Professional component performed at [facility], [place], [facility], [place], [place], [place] [address] Lab: [phone] Dir: Kathi Simpers, MD       Assessment & Plan:   Problem List Items Addressed This Visit       Cardiovascular and Mediastinum   CAD (coronary artery disease)    Will keep BP and cholesterol under good control. Continue to follow with cardiology. Call with any concerns.       Relevant Medications   furosemide (LASIX) 40 MG tablet   isosorbide mononitrate (IMDUR) 30 MG 24 hr tablet   Other Relevant Orders   CBC with Differential/Platelet   Comprehensive metabolic panel   Unstable angina (HCC)    Will keep BP and cholesterol under good control. Continue to follow with cardiology. Call with any concerns.       Relevant  Medications   furosemide (LASIX) 40 MG tablet   isosorbide mononitrate (IMDUR) 30 MG 24 hr tablet   Aortic atherosclerosis (HCC)    Will keep BP and cholesterol under good control. Continue to follow with cardiology. Call with any concerns.       Relevant Medications   furosemide (LASIX) 40 MG tablet   isosorbide mononitrate (IMDUR) 30 MG 24 hr tablet   Other Relevant Orders   CBC with Differential/Platelet   Comprehensive metabolic panel     Respiratory   COPD (chronic obstructive pulmonary disease) (HCC)    Under good control on current regimen. Continue current regimen. Continue to monitor. Call with any concerns. Refills given. Labs drawn today.        Relevant Orders   CBC with Differential/Platelet   Comprehensive metabolic panel     Endocrine   Diabetes mellitus with chronic kidney disease ([place]) - Primary    Doing great with A1c of 5.2. Not on medication. Continue to monitor. Call with any concerns.       Relevant Orders   Bayer DCA Hb A1c Waived   CBC with Differential/Platelet   Comprehensive metabolic panel   Microalbumin, Urine Waived     Genitourinary   Hypertensive renal disease    Under good control on current regimen. Continue current regimen. Continue to monitor. Call with any concerns. Refills given. Labs drawn today.       Relevant Orders   CBC with Differential/Platelet   Comprehensive metabolic panel   Microalbumin, Urine Waived     Other   Hyperlipidemia    Under good control on current regimen. Continue current regimen. Continue to monitor. Call with any concerns. Refills given. Labs drawn today.       Relevant Medications   furosemide (LASIX) 40 MG tablet   isosorbide mononitrate (IMDUR) 30 MG 24 hr tablet   Other Relevant Orders   CBC with Differential/Platelet   Comprehensive metabolic panel   Lipid Panel w/o Chol/HDL Ratio   Depression, major, single episode, moderate (HCC)    Not doing well. Will increase her wellbutrin to '300mg'$   and recheck 3 months. Continue zoloft and seroquel. Call with any concerns. Continue to monitor.  Relevant Medications   buPROPion (WELLBUTRIN XL) 300 MG 24 hr tablet   sertraline (ZOLOFT) 100 MG tablet   traZODone (DESYREL) 50 MG tablet   Other Relevant Orders   CBC with Differential/Platelet   Comprehensive metabolic panel   Other Visit Diagnoses     Left hip pain       Small concern for fracture- will check x-ray and treat with lidoderm. Call with any concerns. Continue to monitor.    Relevant Orders   DG Hip Unilat W OR W/O Pelvis 2-3 Views Left        Follow up plan: Return in about 3 months (around 06/27/2022).

## 2022-03-28 LAB — CBC WITH DIFFERENTIAL/PLATELET
Basophils Absolute: 0 10*3/uL (ref 0.0–0.2)
Basos: 1 %
EOS (ABSOLUTE): 0 10*3/uL (ref 0.0–0.4)
Eos: 1 %
Hematocrit: 40.6 % (ref 34.0–46.6)
Hemoglobin: 13.1 g/dL (ref 11.1–15.9)
Immature Grans (Abs): 0 10*3/uL (ref 0.0–0.1)
Immature Granulocytes: 0 %
Lymphocytes Absolute: 0.8 10*3/uL (ref 0.7–3.1)
Lymphs: 19 %
MCH: 28.1 pg (ref 26.6–33.0)
MCHC: 32.3 g/dL (ref 31.5–35.7)
MCV: 87 fL (ref 79–97)
Monocytes Absolute: 0.3 10*3/uL (ref 0.1–0.9)
Monocytes: 8 %
Neutrophils Absolute: 3.1 10*3/uL (ref 1.4–7.0)
Neutrophils: 71 %
Platelets: 139 10*3/uL — ABNORMAL LOW (ref 150–450)
RBC: 4.66 x10E6/uL (ref 3.77–5.28)
RDW: 14 % (ref 11.7–15.4)
WBC: 4.3 10*3/uL (ref 3.4–10.8)

## 2022-03-28 LAB — COMPREHENSIVE METABOLIC PANEL
ALT: 11 IU/L (ref 0–32)
AST: 16 IU/L (ref 0–40)
Albumin/Globulin Ratio: 2 (ref 1.2–2.2)
Albumin: 3.9 g/dL (ref 3.8–4.8)
Alkaline Phosphatase: 104 IU/L (ref 44–121)
BUN/Creatinine Ratio: 12 (ref 12–28)
BUN: 16 mg/dL (ref 8–27)
Bilirubin Total: 0.3 mg/dL (ref 0.0–1.2)
CO2: 24 mmol/L (ref 20–29)
Calcium: 8.9 mg/dL (ref 8.7–10.3)
Chloride: 100 mmol/L (ref 96–106)
Creatinine, Ser: 1.35 mg/dL — ABNORMAL HIGH (ref 0.57–1.00)
Globulin, Total: 2 g/dL (ref 1.5–4.5)
Glucose: 106 mg/dL — ABNORMAL HIGH (ref 70–99)
Potassium: 3.5 mmol/L (ref 3.5–5.2)
Sodium: 140 mmol/L (ref 134–144)
Total Protein: 5.9 g/dL — ABNORMAL LOW (ref 6.0–8.5)
eGFR: 41 mL/min/{1.73_m2} — ABNORMAL LOW (ref >59)

## 2022-03-28 LAB — LIPID PANEL W/O CHOL/HDL RATIO
Cholesterol, Total: 141 mg/dL (ref 100–199)
HDL: 47 mg/dL (ref >39)
LDL Chol Calc (NIH): 70 mg/dL (ref 0–99)
Triglycerides: 134 mg/dL (ref 0–149)
VLDL Cholesterol Cal: 24 mg/dL (ref 5–40)

## 2022-03-31 ENCOUNTER — Ambulatory Visit
Admission: RE | Admit: 2022-03-31 | Discharge: 2022-03-31 | Disposition: A | Discharge status: Home / Self Care | Payer: Medicare Other | Attending: Family Medicine | Admitting: Family Medicine

## 2022-03-31 ENCOUNTER — Ambulatory Visit
Admission: RE | Admit: 2022-03-31 | Discharge: 2022-03-31 | Disposition: A | Discharge status: Home / Self Care | Payer: Medicare Other | Source: Ambulatory Visit | Attending: Family Medicine | Admitting: Family Medicine

## 2022-03-31 DIAGNOSIS — M47816 Spondylosis without myelopathy or radiculopathy, lumbar region: Secondary | ICD-10-CM | POA: Diagnosis not present

## 2022-03-31 DIAGNOSIS — M25552 Pain in left hip: Secondary | ICD-10-CM | POA: Insufficient documentation

## 2022-03-31 DIAGNOSIS — M1612 Unilateral primary osteoarthritis, left hip: Secondary | ICD-10-CM | POA: Diagnosis not present

## 2022-04-02 ENCOUNTER — Telehealth: Payer: Self-pay

## 2022-04-02 NOTE — Telephone Encounter (Signed)
Copied from [place] [id number]. Topic: General - Other >> Apr 02, 2022  2:07 PM Everette C wrote: Reason for CRM: The patient has returned a missed call from the practice There were no new notes or encounters at the time of returned call Please contact further.   Called and spoke to the patient. No once called the patient from this office. Patient states that her Cardiologist was going to reach out to Dr. Wynetta Emery regarding her chest x-ray results she had with them last week.   Dr. Wynetta Emery, have you seen anything regarding this?

## 2022-04-27 ENCOUNTER — Ambulatory Visit: Payer: Medicare Other

## 2022-05-12 ENCOUNTER — Telehealth: Payer: Self-pay | Admitting: Family Medicine

## 2022-05-12 NOTE — Telephone Encounter (Signed)
Copied from [place] [id number]. Topic: Medicare AWV >> May 12, 2022 12:03 PM Jae Dire wrote: Reason for CRM:  Left message for patient to call back and schedule Medicare Annual Wellness Visit (AWV). Please offer to do virtually or by telephone.  Last AWV: 04/25/2021  Please schedule at any time with [facility]Nurse Health Advisor.  30 minute appointment for Virtual or phone 45 minute appointment for Initial virtual/phone  Any questions, please contact me at [phone]

## 2022-05-18 ENCOUNTER — Ambulatory Visit (INDEPENDENT_AMBULATORY_CARE_PROVIDER_SITE_OTHER): Payer: Medicare Other | Admitting: *Deleted

## 2022-05-18 ENCOUNTER — Ambulatory Visit (INDEPENDENT_AMBULATORY_CARE_PROVIDER_SITE_OTHER): Payer: Medicare Other

## 2022-05-18 DIAGNOSIS — Z0000 Encounter for general adult medical examination without abnormal findings: Secondary | ICD-10-CM | POA: Diagnosis not present

## 2022-05-18 NOTE — Patient Instructions (Signed)
Visit Information   Goals Addressed   None    Patient Care Plan: RNCM: Fall Risk (Adult)     Problem Identified: RNCM: Fall Risk   Priority: High     Long-Range Goal: RNCM: Absence of Fall and Fall-Related Injury   Priority: High  Note:   Current Barriers:  Knowledge Deficits related to fall precautions in patient with frequent falls and multiple chronic conditions.  Decreased adherence to prescribed treatment for fall prevention Unable to independently manage falls in her home environment  Does not adhere to provider recommendations re: go to ER for evaluation after falls  Lacks social connections Does not contact provider office for questions/concerns Knowledge Deficits related to resources to help in the home to increase safety  Chronic Disease Management support and education needs related to patient with frequent falls and multiple chronic conditions Lacks caregiver support.  Clinical Goal(s):  patient will demonstrate improved adherence to prescribed treatment plan for decreasing falls as evidenced by patient reporting and review of EMR patient will verbalize using fall risk reduction strategies discussed patient will not experience additional falls patient will verbalize understanding of plan for effective management of falls prevention and safety in the home and her environment  patient will work with [facility] and pcp  to address needs related to frequent falls patient will attend all scheduled medical appointments: 12-31-2020 at 4:20 pm Interventions:  Collaboration with Valerie Roys, DO regarding development and update of comprehensive plan of care as evidenced by provider attestation and co-signature Inter-disciplinary care team collaboration (see longitudinal plan of care) Provided written and verbal education re: Potential causes of falls and Fall prevention strategies Reviewed medications and discussed potential side effects of medications such as dizziness and  frequent urination Assessed for s/s of orthostatic hypotension Assessed for falls since last encounter. The patient verbalized her last fall was 10-07-2020 Assessed patients knowledge of fall risk prevention secondary to previously provided education. Assessed working status of life alert bracelet and patient adherence Provided patient information for fall alert systems Evaluation of current treatment plan related to falls prevention and safety in the home  and patient's adherence to plan as established by provider. Advised patient to call the office to report new falls or injuries  Provided education to patient re: fall prevention measures, being safe, using DME  Reviewed scheduled/upcoming provider appointments including: 12-31-2020 at 4:20 pm Discussed plans with patient for ongoing care management follow up and provided patient with direct contact information for care management team Self-Care Deficits:  Unable to independently manage frequent falls  Does not adhere to provider recommendations re: going to the ER to be evaluated after having a fall Lacks social connections Unable to perform IADLs independently Does not contact provider office for questions/concerns Patient Goals:  - Utilize walker (assistive device) appropriately with all ambulation - De-clutter walkways - Change positions slowly - Wear secure fitting shoes at all times with ambulation - Utilize home lighting for dim lit areas - Demonstrate self and pet awareness at all times - activities of daily living skills assessed - assistive or adaptive device use encouraged - barriers to physical activity or exercise addressed - barriers to physical activity or exercise identified - barriers to safety identified - cognition assessed - cognitive-stimulating activities promoted - fall prevention plan reviewed and updated - fear of falling, loss of independence and pain acknowledged - medication list reviewed - modification of  home and work environment promoted - vision and/or hearing aid use promoted Follow Up Plan: Telephone  follow up appointment with care management team member scheduled for: 01-03-2021 at 1 pm    Task: RNCM: Identify and Manage Contributors to Fall Risk   Note:   Care Management Activities:    - activities of daily living skills assessed - assistive or adaptive device use encouraged - barriers to physical activity or exercise addressed - barriers to physical activity or exercise identified - barriers to safety identified - cognition assessed - cognitive-stimulating activities promoted - fall prevention plan reviewed and updated - fear of falling, loss of independence and pain acknowledged - medication list reviewed - modification of home and work environment promoted - vision and/or hearing aid use promoted        Patient Care Plan: RNCM: Hypertension (Adult)     Problem Identified: RNCM: Hypertension (Hypertension)   Priority: Medium     Long-Range Goal: RNCM: Hypertension Monitored   Priority: Medium  Note:   Objective:  Last practice recorded BP readings:  BP Readings from Last 3 Encounters:  10/03/20 138/64  09/20/20 (!) 192/91  08/28/20 (!) 153/82   Most recent eGFR/CrCl: No results found for: EGFR  No components found for: CRCL Current Barriers:  Knowledge Deficits related to basic understanding of hypertension pathophysiology and self care management Knowledge Deficits related to understanding of medications prescribed for management of hypertension Limited Social Support Lacks social connections Does not contact provider office for questions/concerns Case Manager Clinical Goal(s):  Over the next 120 days, patient will verbalize understanding of plan for hypertension management Over the next 120 days, patient will attend all scheduled medical appointments: 12-31-2020 Over the next 120 days, patient will demonstrate improved adherence to prescribed treatment plan  for hypertension as evidenced by taking all medications as prescribed, monitoring and recording blood pressure as directed, adhering to low sodium/DASH diet Over the next 120 days, patient will demonstrate improved health management independence as evidenced by checking blood pressure as directed and notifying PCP if SBP>160 or DBP > 90, taking all medications as prescribe, and adhering to a low sodium diet as discussed. Over the next 120 days, patient will verbalize basic understanding of hypertension disease process and self health management plan as evidenced by compliance with medications, compliance with dietary restrictions, and working with the CCM team to manage health and well being  Interventions:  Collaboration with Valerie Roys, DO regarding development and update of comprehensive plan of care as evidenced by provider attestation and co-signature Inter-disciplinary care team collaboration (see longitudinal plan of care) Evaluation of current treatment plan related to hypertension self management and patient's adherence to plan as established by provider. Provided education to patient re: stroke prevention, s/s of heart attack and stroke, DASH diet, complications of uncontrolled blood pressure Reviewed medications with patient and discussed importance of compliance Discussed plans with patient for ongoing care management follow up and provided patient with direct contact information for care management team Advised patient, providing education and rationale, to monitor blood pressure daily and record, calling PCP for findings outside established parameters. The patient states her blood pressures have been elevated. She states that most recently it was 160/95. Education on blood pressures greater than 546 systolic and greater than 90 diastolic place the patient at increased risk of heart attack and stroke. Will continue to monitor.  Reviewed scheduled/upcoming provider appointments  including: 12-31-2020 Patient Goals: - blood pressure trends reviewed - depression screen reviewed - home or ambulatory blood pressure monitoring encouraged Self-Care Activities: - Self administers medications as prescribed Attends all scheduled provider  appointments Calls provider office for new concerns, questions, or BP outside discussed parameters Checks BP and records as discussed Follows a low sodium diet/DASH diet Follow Up Plan: Telephone follow up appointment with care management team member scheduled for: 01-03-2021 at 1 pm    Task: RNCM: Identify and Monitor Blood Pressure Elevation   Note:   Care Management Activities:    - blood pressure trends reviewed - depression screen reviewed - home or ambulatory blood pressure monitoring encouraged        Patient Care Plan: RNCM: Coronary Artery Disease (Adult) and HLD     Problem Identified: RNCM: Disease Progression (Coronary Artery Disease) and HLD   Priority: Medium     Long-Range Goal: RNCM: Disease Progression Prevented or Minimized   Priority: Medium  Note:   Current Barriers:  Poorly controlled hyperlipidemia, complicated by active smoker, HTN, DM Current antihyperlipidemic regimen: Crestor 20 mg QD Most recent lipid panel:     Component Value Date/Time   CHOL 142 04/18/2020 1623   CHOL 164 02/04/2016 1043   CHOL 258 (H) 01/16/2014 1200   TRIG 152 (H) 04/18/2020 1623   TRIG 258 (H) 02/04/2016 1043   TRIG 337 (H) 01/16/2014 1200   HDL 41 04/18/2020 1623   HDL 35 (L) 01/16/2014 1200   CHOLHDL 4.3 11/26/2016 0810   VLDL 51 (H) 11/26/2016 0810   VLDL 52 (H) 02/04/2016 1043   VLDL 67 (H) 01/16/2014 1200   [place] 75 04/18/2020 1623   [place] 156 (H) 01/16/2014 1200   ASCVD risk enhancing conditions: age >56, DM, HTN,  CHF, current smoker Lacks Scientist, product/process development Does not contact provider office for questions/concerns RN Care Manager Clinical Goal(s):  patient will work with Consulting civil engineer, providers, and  care team towards execution of optimized self-health management plan patient will verbalize understanding of plan for effective management of HLD and CAD patient will work with [facility] and PCP  to address needs related to HLD and CAD management  patient will attend all scheduled medical appointments: 12-31-2020 Interventions: Collaboration with Valerie Roys, DO regarding development and update of comprehensive plan of care as evidenced by provider attestation and co-signature Inter-disciplinary care team collaboration (see longitudinal plan of care) Medication review performed; medication list updated in electronic medical record.  Inter-disciplinary care team collaboration (see longitudinal plan of care) Referred to pharmacy team for assistance with HLD and CAD medication management Collaboration with the pcp, the patient says sometimes she is throwing her medications up after taking them. She has nausea medications but is not nauseated before taking the medications. It happens at different times and she has no precipitating factors that contribute to it. Ask the patient to try eating something and waiting 30 minutes before taking medications. Will let pcp know the patient is reporting this happening more frequently.  Evaluation of current treatment plan related to HLD and CAD and patient's adherence to plan as established by provider. Advised patient to call the office for questions or changes  Provided education to patient re: heart healthy diet, maintaining health and well being  Reviewed scheduled/upcoming provider appointments including: 12-31-2020 at 4:20 pm Discussed plans with patient for ongoing care management follow up and provided patient with direct contact information for care management team Patient Goals/Self-Care Activities: - call for medicine refill 2 or 3 days before it runs out - call if I am sick and can't take my medicine - keep a list of all the medicines I take; vitamins and  herbals too -  learn to read medicine labels - use a pillbox to sort medicine - use an alarm clock or phone to remind me to take my medicine - change to whole grain breads, cereal, pasta - drink 6 to 8 glasses of water each day - eat 3 to 5 servings of fruits and vegetables each day - eat 5 or 6 small meals each day - fill half the plate with nonstarchy vegetables - limit fast food meals to no more than 1 per week - manage portion size - prepare main meal at home 3 to 5 days each week - read food labels for fat, fiber, carbohydrates and portion size - be open to making changes - I can manage, know and watch for signs of a heart attack - if I have chest pain, call for help - learn about small changes that will make a big difference - learn my personal risk factors - barriers to treatment adherence reviewed and addressed - functional limitation screening reviewed - healthy lifestyle promoted - medication-adherence assessment completed - medication side effects managed - rescue (action) plan developed - response to pharmacologic therapy monitored - self-awareness of signs/symptoms of worsening disease encouraged - smoking counseling provided  Follow Up Plan: Telephone follow up appointment with care management team member scheduled for: 01-03-2021 at 1 pm      Task: RNCM: Alleviate Barriers to Coronary Artery Disease Therapy   Note:   Care Management Activities:    - barriers to treatment adherence reviewed and addressed - functional limitation screening reviewed - healthy lifestyle promoted - medication-adherence assessment completed - medication side effects managed - rescue (action) plan developed - response to pharmacologic therapy monitored - self-awareness of signs/symptoms of worsening disease encouraged - smoking counseling provided        Patient Care Plan: RNCM: Heart Failure (Adult)     Problem Identified: RNCM: Symptom Exacerbation (Heart Failure)    Priority: Medium     Long-Range Goal: RNCM: Symptom Exacerbation Prevented or Minimized   Priority: Medium  Note:   Current Barriers:  Knowledge deficits related to basic heart failure pathophysiology and self care management Lacks social connections Unable to perform IADLs independently Does not contact provider office for questions/concerns Lack of scale in home Financial strain Nurse Case Manager Clinical Goal(s):  patient will weigh self daily and record patient will verbalize understanding of Heart Failure Action Plan and when to call doctor patient will take all Heart Failure mediations as prescribed Interventions:  Collaboration with Valerie Roys, DO regarding development and update of comprehensive plan of care as evidenced by provider attestation and co-signature Inter-disciplinary care team collaboration (see longitudinal plan of care) Basic overview and discussion of pathophysiology of Heart Failure Provided written and verbal education on low sodium diet Reviewed Heart Failure Action Plan in depth and provided written copy Assessed for scales in home- has scales but does not use due to safety reasons Discussed importance of daily weight- the patient does not weigh daily, she has fallen multiple times while weighing, review of ways to tell fluid overload without weighing daily  Reviewed role of diuretics in prevention of fluid overload Patient Goals/Self-Care Activities:  Takes Heart Failure Medications as prescribed Weighs daily and record (notifying MD of 3 lb weight gain over night or 5 lb in a week) Verbalizes understanding of and follows CHF Action Plan Adheres to low sodium diet  - Take Heart Failure Medications as prescribed - Weigh daily and record (notify MD with 3 lb weight gain over  night or 5 lb in a week) - Follow CHF Action Plan -- barriers to lifestyle changes reviewed and addressed - barriers to treatment reviewed and addressed - cognitive  screening completed and reviewed - depression screen reviewed - health literacy screening completed or reviewed - healthy lifestyle promoted - medication-adherence assessment completed - rescue (action) plan developed - rescue (action) plan reviewed - self-awareness of signs/symptoms of worsening disease encouraged  Adhere to low sodium diet  Follow Up Plan: Telephone follow up appointment with care management team member scheduled for: 01-03-2021 at 1 pm    Task: RNCM: Identify and Minimize Risk of Heart Failure Exacerbation   Note:   Care Management Activities:    - barriers to lifestyle changes reviewed and addressed - barriers to treatment reviewed and addressed - cognitive screening completed and reviewed - depression screen reviewed - health literacy screening completed or reviewed - healthy lifestyle promoted - medication-adherence assessment completed - rescue (action) plan developed - rescue (action) plan reviewed - self-awareness of signs/symptoms of worsening disease encouraged        Patient Care Plan: CCM Pharmacy Care Plan     Problem Identified: DM, CAD, HF, COPD, tobacco use, HLD,   Priority: High     Long-Range Goal: Disease Management   Start Date: 12/22/2021  Recent Progress: On track  Priority: High  Note:   Current Barriers:  Does not contact provider office for questions/concerns  Pharmacist Clinical Goal(s):  Patient will contact provider office for questions/concerns as evidenced notation of same in electronic health record through collaboration with PharmD and provider.   Interventions: 1:1 collaboration with Valerie Roys, DO regarding development and update of comprehensive plan of care as evidenced by provider attestation and co-signature Inter-disciplinary care team collaboration (see longitudinal plan of care) Comprehensive medication review performed; medication list updated in electronic medical record  Hyperlipidemia/CAD: (LDL  goal < 70) The ASCVD Risk score (Arnett DK, et al., 2019) failed to calculate for the following reasons:   The patient has a prior MI or stroke diagnosis Lab Results  Component Value Date   CHOL 141 03/27/2022   CHOL 190 06/23/2021   CHOL 142 04/18/2020   Lab Results  Component Value Date   HDL 47 03/27/2022   HDL 54 06/23/2021   HDL 41 04/18/2020   Lab Results  Component Value Date   LDLCALC 70 03/27/2022   [place] 95 06/23/2021   LDLCALC 75 04/18/2020   Lab Results  Component Value Date   TRIG 134 03/27/2022   TRIG 243 (H) 06/23/2021   TRIG 152 (H) 04/18/2020   Lab Results  Component Value Date   CHOLHDL 4.3 11/26/2016  No results found for: "LDLDIRECT" Last vitamin D No results found for: "25OHVITD2", "25OHVITD3", "VD25OH" Lab Results  Component Value Date   TSH 2.940 06/23/2021  -Not ideally controlled -Current treatment: Rosuvastatin 31m Query Appropriate,  05/14/22 for 90 days, 09/30/21 for 90 days Clopidogrel 769mQuery Appropriate,  03/17/22 for 90 days, 07/09/21 for 90 dyas -Medications previously tried: N/A  -Current dietary patterns: "Tries to eat healthy" -Current exercise habits: None -Educated on Cholesterol goals;  Sept 2023: Tried counseling patient on compliance but she denies non-adherence. Says she only forgets once a week at most  Heart Failure (Goal: manage symptoms and prevent exacerbations) -Controlled -Last EF 35-40% 11/2019 -HF type: Left Ventricular Failure -NYHA Class: Unable to determine from Cardio note -AHA HF Stage:  Unable to determine from Cardio note -Current treatment: Amiodarone 10029mD Query Appropriate,  05/15/22 for 30 days, 03/16/22 for 30 days, 11/05/21 for 30 days Carvedilol 12.27m QD Query Appropriate,  03/26/22 for 90 days Furosemide 484mAppropriate, Effective, Safe, Accessible ISMN [id number] Appropriate,  03/27/22 for 90 days Entresto 49/51 Query Appropriate,  Getting from PAP, but she told me she hasn't renewed PAP  for 2023, is still taking extras from 2022. Making me believe she is also non-compliant on this as well -Medications previously tried: N/A  -Current home BP/HR readings:  05/06/22: 122/69 05/07/22: 143/74 05/08/22: 116/70 05/09/22: 118/66 05/10/22: 128/68 05/11/22: 123/71 05/12/22: 122/64 05/13/22: 143/79 05/14/22: 143/77 05/15/22: 104/72 05/16/22: 156/88 (Forgot meds) 05/17/22: 136/73 05/18/22: 104/59 Breakfast: Oatmeal Woke up at 0900, no lunch yet Drinking iced tea now, caffeinated.  -Current dietary habits: "Tries to eat healthy" -Current exercise habits: None -Educated on Benefits of medications for managing symptoms and prolonging life Sept 2023: Very non-compliant but she denies non-compliance. Will let PCP and Cardio know  Pre-Diabetes (A1c goal <6.5%) Lab Results  Component Value Date   HGBA1C 5.2 03/27/2022   HGBA1C 5.1 06/23/2021   HGBA1C 5.1 08/07/2020   Lab Results  Component Value Date   MICROALBUR 80 (H) 03/27/2022   LDLCALC 70 03/27/2022   CREATININE 1.35 (H) 03/27/2022   Lab Results  Component Value Date   NA 140 03/27/2022   K 3.5 03/27/2022   CREATININE 1.35 (H) 03/27/2022   EGFR 41 (L) 03/27/2022   GFRNONAA 72 09/24/2020   GLUCOSE 106 (H) 03/27/2022   Lab Results  Component Value Date   WBC 4.3 03/27/2022   HGB 13.1 03/27/2022   HCT 40.6 03/27/2022   MCV 87 03/27/2022   PLT 139 (L) 03/27/2022   Lab Results  Component Value Date   LABMICR See below: 06/23/2021   LABMICR See below: 08/28/2020   MICROALBUR 80 (H) 03/27/2022   MICROALBUR 80 (H) 06/23/2021  -Controlled -Current medications: None -Medications previously tried: N/A  -Current home glucose readings fasting glucose: N/A post prandial glucose: N/A -Denies hypoglycemic/hyperglycemic symptoms -Current exercise: none -Educated on Complications of diabetes including kidney damage, retinal damage, and cardiovascular disease; -Counseled to check feet daily and get yearly eye exams -Recommended  to continue current medication  Depression/Anxiety (Goal: PHQ9<5) -Controlled -Current treatment: Bupropion 300m53mpropriate, Effective, Safe, Accessible Quetiapine 100mg39mAppropriate, Effective, Safe, Accessible Sertraline 200mg 64mopriate, Effective, Safe, Accessible -Medications previously tried/failed: N/A -PHQ9:     05/18/2022   10:44 AM 03/27/2022    1:26 PM 11/25/2021    1:30 PM  Depression screen PHQ 2/9  Decreased Interest 2 1 0  Down, Depressed, Hopeless 0 1 0  PHQ - 2 Score 2 2 0  Altered sleeping 2 0 0  Tired, decreased energy 0 1 0  Change in appetite 0 0 0  Feeling bad or failure about yourself  _0 Trouble concentrating 0 0 0  Moving slowly or fidgety/restless 0 0 0  Suicidal thoughts 0 0 0  PHQ-9 Score _1 Difficult doing work/chores Not difficult at all    -GAD7:     03/27/2022    1:26 PM 11/25/2021    1:31 PM 10/23/2021    2:04 PM 07/21/2021    2:53 PM  GAD 7 : Generalized Anxiety Score  Nervous, Anxious, on Edge 1 1 0 0  Control/stop worrying 2 1 0 1  Worry too much - different things 2 1 0 1  Trouble relaxing 1 0 0 0  Restless 0 0 0 0  Easily  annoyed or irritable 0 0 0 1  Afraid - awful might happen 0 0 0 0  Total GAD 7 Score 6 3 0 3  -Educated on Benefits of medication for symptom control -Recommended to continue current medication    COPD (Goal: control symptoms and prevent exacerbations) -Controlled -Current treatment  Albuterol Appropriate, Effective, Safe, Accessible Bevespi (PAP Approve 2023) Appropriate, Effective, Safe, Accessible -Medications previously tried: N/A  -Gold Grade: Unknown -Current COPD Classification:  Unknown -MMRC/CAT score:      No data to display        -Pulmonary function testing:  PF Readings from Last 3 Encounters:  No data found for PF  -Exacerbations requiring treatment in last 6 months: 0 -Patient denies consistent use of maintenance inhaler -Frequency of rescue inhaler use: Patient Couldn't  remember, poor historian -Counseled on [place] inhaler technique; Sept 2023: Will re-new PAP for 2024  Impaired Memory (Goal: slow progression)     No data to display        -Not ideally controlled -Current treatment  Donepezil 86m Query Appropriate,  -Medications previously tried: N/A  Sept 2023: Recommend MMSE  Patient Goals/Self-Care Activities Patient will:  - take medications as prescribed as evidenced by patient report and record review  Follow Up Plan: The patient has been provided with contact information for the care management team and has been advised to call with any health related questions or concerns.   CCM F/U Monthly for BP readings  NArizona Constable Pharm.D. -- [phone]      Ms. LMcfaydenwas given information about Chronic Care Management services today including:  CCM service includes personalized support from designated clinical staff supervised by her physician, including individualized plan of care and coordination with other care providers 24/7 contact phone numbers for assistance for urgent and routine care needs. Standard insurance, coinsurance, copays and deductibles apply for chronic care management only during months in which we provide at least 20 minutes of these services. Most insurances cover these services at 100%, however patients may be responsible for any copay, coinsurance and/or deductible if applicable. This service may help you avoid the need for more expensive face-to-face services. Only one practitioner may furnish and bill the service in a calendar month. The patient may stop CCM services at any time (effective at the end of the month) by phone call to the office staff.  Patient agreed to services and verbal consent obtained.   The patient verbalized understanding of instructions, educational materials, and care plan provided today and DECLINED offer to receive copy of patient instructions, educational materials, and care plan.  The  pharmacy team will reach out to the patient again over the next 60 days.   NLane Hacker [place]

## 2022-05-18 NOTE — Progress Notes (Signed)
Chronic Care Management Pharmacy Note  05/18/2022 Name:  Sarah White MRN:  [id number] DOB:  [birth year]  Summary: -Pleasant 76 year old female presents for f/u Ccm visit  Recommendations/Changes made from today's visit: -Recommend MMSE for patient's Donepezil monitoring -Patient very non-complaint on meds. Will have PCP co-sign note to alert them of non-compliance. Please see CP/bottom of this note for dates. Will defer to PCP on how to proceed  -Patient has fallen a few times recently. Could be due to non-compliance (Doesn't take meds consistently and when she does, she gets very hypotensive (Like today) and falls)  Subjective: Sarah White is an 76 y.o. year old female who is a primary patient of Valerie Roys, DO.  The CCM team was consulted for assistance with disease management and care coordination needs.    Engaged with patient by telephone for follow up visit in response to provider referral for pharmacy case management and/or care coordination services.   Consent to Services:  The patient was given the following information about Chronic Care Management services today, agreed to services, and gave verbal consent: 1. CCM service includes personalized support from designated clinical staff supervised by the primary care provider, including individualized plan of care and coordination with other care providers 2. 24/7 contact phone numbers for assistance for urgent and routine care needs. 3. Service will only be billed when office clinical staff spend 20 minutes or more in a month to coordinate care. 4. Only one practitioner may furnish and bill the service in a calendar month. 5.The patient may stop CCM services at any time (effective at the end of the month) by phone call to the office staff. 6. The patient will be responsible for cost sharing (co-pay) of up to 20% of the service fee (after annual deductible is met). Patient agreed to services and consent obtained.  Patient Care  Team: Valerie Roys, [name] Yolonda Kida, MD as Consulting Physician (Cardiology) Yolonda Kida, MD as Consulting Physician (Cardiology) Lane Hacker, [facility] (Pharmacist)  Recent office visits:  11/25/21-Megan Annia Friendly, DO (PCP) Seen for hypertension. Increase Imdur from 30 mg to 60 mg. Follow up in 3-4 weeks.    Recent consult visits:  None noted   Hospital visits:  None in previous 6 months     Objective:  Lab Results  Component Value Date   CREATININE 1.35 (H) 03/27/2022   BUN 16 03/27/2022   EGFR 41 (L) 03/27/2022   GFRNONAA 72 09/24/2020   GFRAA 83 09/24/2020   NA 140 03/27/2022   K 3.5 03/27/2022   CALCIUM 8.9 03/27/2022   CO2 24 03/27/2022   GLUCOSE 106 (H) 03/27/2022    Lab Results  Component Value Date/Time   HGBA1C 5.2 03/27/2022 01:29 PM   HGBA1C 5.1 06/23/2021 03:14 PM   HGBA1C 10.8 (H) 01/15/2014 04:08 PM   MICROALBUR 80 (H) 03/27/2022 01:29 PM   MICROALBUR 80 (H) 06/23/2021 03:14 PM    Last diabetic Eye exam:  Lab Results  Component Value Date/Time   HMDIABEYEEXA No Retinopathy 09/10/2015 01:16 PM    Last diabetic Foot exam: No results found for: "HMDIABFOOTEX"   Lab Results  Component Value Date   CHOL 141 03/27/2022   HDL 47 03/27/2022   LDLCALC 70 03/27/2022   TRIG 134 03/27/2022   CHOLHDL 4.3 11/26/2016       Latest Ref Rng & Units 03/27/2022    1:33 PM 06/23/2021  3:18 PM 09/24/2020    2:02 PM  Hepatic Function  Total Protein 6.0 - 8.5 g/dL 5.9  6.1  6.2   Albumin 3.8 - 4.8 g/dL 3.9  4.2  4.3   AST 0 - 40 IU/L _0 ALT 0 - 32 IU/L _1 Alk Phosphatase 44 - 121 IU/L 104  98  101   Total Bilirubin 0.0 - 1.2 mg/dL 0.3  0.2  <0.2     Lab Results  Component Value Date/Time   TSH 2.940 06/23/2021 03:18 PM   TSH 2.350 03/24/2018 10:43 AM       Latest Ref Rng & Units 03/27/2022    1:33 PM 06/23/2021    3:18 PM 09/24/2020    2:02 PM  CBC  WBC 3.4 - 10.8 x10E3/uL  4.3  6.8  4.6   Hemoglobin 11.1 - 15.9 g/dL 13.1  14.0  13.8   Hematocrit 34.0 - 46.6 % 40.6  41.9  41.7   Platelets 150 - 450 x10E3/uL 139  146  112     No results found for: "VD25OH"  Clinical ASCVD: Yes  The ASCVD Risk score (Arnett DK, et al., 2019) failed to calculate for the following reasons:   The patient has a prior MI or stroke diagnosis       05/18/2022   10:44 AM 03/27/2022    1:26 PM 11/25/2021    1:30 PM  Depression screen PHQ 2/9  Decreased Interest 2 1 0  Down, Depressed, Hopeless 0 1 0  PHQ - 2 Score 2 2 0  Altered sleeping 2 0 0  Tired, decreased energy 0 1 0  Change in appetite 0 0 0  Feeling bad or failure about yourself  _2 Trouble concentrating 0 0 0  Moving slowly or fidgety/restless 0 0 0  Suicidal thoughts 0 0 0  PHQ-9 Score _3 Difficult doing work/chores Not difficult at all       Other: (CHADS2VASc if Afib, MMRC or CAT for COPD, ACT, DEXA)  Social History   Tobacco Use  Smoking Status Every Day   Packs/day: 1.00   Types: Cigarettes  Smokeless Tobacco Never   BP Readings from Last 3 Encounters:  03/27/22 103/69  11/25/21 123/75  10/23/21 (!) 105/58   Pulse Readings from Last 3 Encounters:  03/27/22 69  11/25/21 72  10/23/21 62   Wt Readings from Last 3 Encounters:  03/27/22 159 lb 9.6 oz (72.4 kg)  11/25/21 161 lb 3.2 oz (73.1 kg)  10/23/21 150 lb (68 kg)   BMI Readings from Last 3 Encounters:  03/27/22 27.40 kg/m  11/25/21 27.67 kg/m  10/23/21 25.75 kg/m    Assessment/Interventions: Review of patient past medical history, allergies, medications, health status, including review of consultants reports, laboratory and other test data, was performed as part of comprehensive evaluation and provision of chronic care management services.   SDOH:  (Social Determinants of Health) assessments and interventions performed: Yes SDOH Interventions    Flowsheet Row Chronic Care Management from 05/18/2022 in [facility] Most recent reading at 05/18/2022  2:00 PM Clinical Support from 05/18/2022 in [facility] Most recent reading at 05/18/2022 10:44 AM Office Visit from 06/23/2021 in [facility] Most recent reading at 06/23/2021  3:00 PM Clinical Support from 04/22/2020 in [facility] Most recent reading at 04/22/2020  1:17 PM Office Visit from 04/18/2020 in [facility]  Most recent reading at 04/21/2020  1:21 PM Chronic Care Management from 03/26/2020 in [facility] Most recent reading at 03/26/2020  2:20 PM  SDOH Interventions        Food Insecurity Interventions -- Intervention Not Indicated -- -- -- --  Housing Interventions -- Intervention Not Indicated -- -- -- --  Transportation Interventions Intervention Not Indicated -- -- -- -- --  Utilities Interventions -- Intervention Not Indicated -- -- -- --  Depression Interventions/Treatment  -- Currently on Treatment Currently on Treatment Currently on Treatment Medication --  Financial Strain Interventions Other (Comment)  [PAP] -- -- -- -- Other (Comment)  [patient assistance application]  Physical Activity Interventions -- Intervention Not Indicated -- -- -- --  Stress Interventions -- Intervention Not Indicated -- -- -- --  Social Connections Interventions -- Intervention Not Indicated -- -- -- --      SDOH Screenings   Food Insecurity: No Food Insecurity (05/18/2022)  Housing: Low Risk  (05/18/2022)  Transportation Needs: No Transportation Needs (05/18/2022)  Utilities: Not At Risk (05/18/2022)  Alcohol Screen: Low Risk  (05/18/2022)  Depression (PHQ2-9): Medium Risk (05/18/2022)  Financial Resource Strain: High Risk (05/18/2022)  Physical Activity: Inactive (05/18/2022)  Social Connections: Socially Isolated (05/18/2022)  Stress: No Stress Concern Present (05/18/2022)  Tobacco Use: High Risk (05/18/2022)    CCM Care Plan  Allergies  Allergen Reactions   Amlodipine Swelling    Atorvastatin Other (See Comments)    myalgia   Codeine Nausea And Vomiting   Hctz [Hydrochlorothiazide] Other (See Comments)    Hypokalemia   Lisinopril Other (See Comments)    Dizziness    Metformin And Related Nausea Only    Nausea, dizziness    Medications Reviewed Today     Reviewed by Lane Hacker, [facility] (Pharmacist) on 05/18/22 at [place] List Status: <None>   Medication Order Taking? Sig Documenting Provider Last Dose Status Informant  albuterol (ACCUNEB) 0.63 MG/3ML nebulizer solution [id number] No Take 3 mLs (0.63 mg total) by nebulization every 6 (six) hours as needed for wheezing. Johnson, [name], DO Taking Active   albuterol (VENTOLIN HFA) 108 (90 Base) MCG/ACT inhaler [id number] No Inhale 2 puffs into the lungs every 6 (six) hours as needed for wheezing or shortness of breath. Valerie Roys, DO Taking Active            Med Note Darnelle Maffucci, Arville Lime   Tue Mar 26, 2020  1:59 PM) Taking 2 puffs BID  amiodarone (PACERONE) 200 MG tablet [id number] No Take 0.5 tablets by mouth daily. [provider] Taking Active   aspirin EC 81 MG tablet [id number] No Take 81 mg by mouth at bedtime.  [provider] Taking Active Self  buPROPion (WELLBUTRIN XL) 300 MG 24 hr tablet [id number] No Take 1 tablet (300 mg total) by mouth daily. Johnson, [name], DO Taking Active   carvedilol (COREG) 12.5 MG tablet [id number] No Take 12.5 mg by mouth daily. [provider] Taking Active   clopidogrel (PLAVIX) 75 MG tablet [id number] No Take 1 tablet (75 mg total) by mouth daily at 6 (six) AM. Valerie Roys, DO Taking Active   cyclobenzaprine (FLEXERIL) 10 MG tablet [id number] No Take 1 tablet by mouth three times daily as needed for muscle spasm Wynetta Emery, [name], DO Taking Active   diphenhydrAMINE (BENADRYL) 25 MG tablet [id number] No Take 25 mg by mouth at bedtime as needed for allergies. Dies not take regularly [provider] Taking Active Self  Med  Note ([place] Mar 26, 2020  2:01 PM) Taking 1 QPM  donepezil (ARICEPT) 10 MG tablet [id number] No TAKE 1 TABLET BY MOUTH NIGHTLY FOR 30 DAYS [provider] Taking Active   furosemide (LASIX) 40 MG tablet [id number] No Take 1 tablet (40 mg total) by mouth daily. Johnson, [name], DO Taking Active   Glycopyrrolate-Formoterol (BEVESPI AEROSPHERE) 9-4.8 MCG/ACT AERO [id number] No Inhale 2 puffs into the lungs 2 (two) times daily. [provider] Taking Active Self           Med Note Ermalinda Memos Dec 22, 2021 12:28 PM) Patient assistance  isosorbide mononitrate (IMDUR) 30 MG 24 hr tablet [id number] No Take 1 tablet (30 mg total) by mouth daily. Johnson, [name], DO Taking Active   lidocaine (LIDODERM) 5 % [id number] No Place 1 patch onto the skin daily. Remove & Discard patch within 12 hours or as directed by MD Valerie Roys, DO Taking Active   nitroGLYCERIN (NITROSTAT) 0.4 MG SL tablet [id number] No DISSOLVE ONE TABLET UNDER THE TONGUE EVERY 5 MINUTES AS NEEDED FOR CHEST PAIN.  DO NOT EXCEED A TOTAL OF 3 DOSES IN [address], [name], DO Taking Active   pantoprazole (PROTONIX) 40 MG tablet [id number] No Take 1 tablet (40 mg total) by mouth 2 (two) times daily before a meal. Lin Landsman, MD Taking Expired 12/09/21 2359   QUEtiapine (SEROQUEL XR) 50 MG TB24 24 hr tablet [id number] No Take 2 tablets (100 mg total) by mouth at bedtime. Johnson, [name], DO Taking Active   rosuvastatin (CRESTOR) 20 MG tablet [id number] No Take 1 tablet (20 mg total) by mouth daily. Johnson, [name], DO Taking Active   sacubitril-valsartan (ENTRESTO) 49-51 MG [id number] No Take 1 tablet by mouth 2 (two) times daily.  [provider] Taking Active Self           Med Note Gentry Roch   Fri Feb 23, 2020  4:49 PM)    sertraline (ZOLOFT) 100 MG tablet [id number] No Take 2 tablets (200 mg total) by mouth daily. [place] Liter P, DO Taking Active   silver  sulfADIAZINE (SILVADENE) 1 % cream [id number] No Apply 1 application topically daily.  Patient taking differently: Apply 1 application  topically as needed.   Park Liter P, DO Taking Active   traZODone (DESYREL) 50 MG tablet [id number] No Take 1 tablet (50 mg total) by mouth at bedtime as needed. for sleep Valerie Roys, DO Taking Active   triamcinolone ointment (KENALOG) 0.5 % [id number] No Apply 1 application topically 2 (two) times daily. Valerie Roys, DO Taking Active             Patient Active Problem List   Diagnosis Date Noted   Chronic diarrhea of unknown origin    Gastric erythema    Aortic atherosclerosis ([place]) 11/20/2020   Falls frequently 07/30/2020   Bilateral hearing loss 06/26/2020   Dizziness 06/26/2020   Hallucinations, visual 06/26/2020   Memory difficulty 06/26/2020   Symptomatic carotid artery narrowing without infarction 03/06/2020   Symptomatic carotid artery stenosis without infarction 02/04/2020   Insomnia 05/05/2018   Squamous cell carcinoma of arm, right 01/07/2017   Depression, major, single episode, moderate ([place]) 12/01/2016   Unstable angina ([place]) 11/26/2016   Near syncope 03/11/2016   Hypokalemia 03/11/2016   Orthostatic hypotension 08/12/2015   Hyperlipidemia 07/29/2015   Hypertensive renal disease 07/25/2015   Low back pain 07/25/2015  Smoker 07/22/2015   COPD (chronic obstructive pulmonary disease) ([place]) 07/22/2015   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   CAD (coronary artery disease) 07/22/2015    Immunization History  Administered Date(s) Administered   Fluad Quad(high Dose 65+) 04/28/2019, 05/24/2020, 06/23/2021   Influenza, High Dose Seasonal PF 05/18/2016, 05/25/2017, 05/05/2018   Influenza, Seasonal, Injecte, Preservative Fre 06/05/2015   PFIZER(Purple Top)SARS-COV-2 Vaccination 10/16/2019, 11/06/2019, 09/24/2020   Pneumococcal Conjugate-13 07/29/2015   Pneumococcal Polysaccharide-23 09/15/2016    Tdap 09/24/2015   Zoster, Live 02/07/2015    Conditions to be addressed/monitored:  Hypertension, Hyperlipidemia, and Diabetes  Care Plan : [place]  Updates made by Lane Hacker, [place] since 05/18/2022 12:00 AM     Problem: DM, CAD, HF, COPD, tobacco use, HLD,   Priority: High     Long-Range Goal: Disease Management   Start Date: 12/22/2021  Recent Progress: On track  Priority: High  Note:   Current Barriers:  Does not contact provider office for questions/concerns  Pharmacist Clinical Goal(s):  Patient will contact provider office for questions/concerns as evidenced notation of same in electronic health record through collaboration with PharmD and provider.   Interventions: 1:1 collaboration with Valerie Roys, DO regarding development and update of comprehensive plan of care as evidenced by provider attestation and co-signature Inter-disciplinary care team collaboration (see longitudinal plan of care) Comprehensive medication review performed; medication list updated in electronic medical record  Hyperlipidemia/CAD: (LDL goal < 70) The ASCVD Risk score (Arnett DK, et al., 2019) failed to calculate for the following reasons:   The patient has a prior MI or stroke diagnosis Lab Results  Component Value Date   CHOL 141 03/27/2022   CHOL 190 06/23/2021   CHOL 142 04/18/2020   Lab Results  Component Value Date   HDL 47 03/27/2022   HDL 54 06/23/2021   HDL 41 04/18/2020   Lab Results  Component Value Date   LDLCALC 70 03/27/2022   [place] 95 06/23/2021   LDLCALC 75 04/18/2020   Lab Results  Component Value Date   TRIG 134 03/27/2022   TRIG 243 (H) 06/23/2021   TRIG 152 (H) 04/18/2020   Lab Results  Component Value Date   CHOLHDL 4.3 11/26/2016  No results found for: "LDLDIRECT" Last vitamin D No results found for: "25OHVITD2", "25OHVITD3", "VD25OH" Lab Results  Component Value Date   TSH 2.940 06/23/2021  -Not ideally  controlled -Current treatment: Rosuvastatin 28m Query Appropriate,  05/14/22 for 90 days, 09/30/21 for 90 days Clopidogrel 770mQuery Appropriate,  03/17/22 for 90 days, 07/09/21 for 90 dyas -Medications previously tried: N/A  -Current dietary patterns: "Tries to eat healthy" -Current exercise habits: None -Educated on Cholesterol goals;  Sept 2023: Tried counseling patient on compliance but she denies non-adherence. Says she only forgets once a week at most  Heart Failure (Goal: manage symptoms and prevent exacerbations) -Controlled -Last EF 35-40% 11/2019 -HF type: Left Ventricular Failure -NYHA Class: Unable to determine from Cardio note -AHA HF Stage:  Unable to determine from Cardio note -Current treatment: Amiodarone 10073mD Query Appropriate,  05/15/22 for 30 days, 03/16/22 for 30 days, 11/05/21 for 30 days Carvedilol 12.5mg47m Query Appropriate,  03/26/22 for 90 days Furosemide 40mg58mropriate, Effective, Safe, Accessible ISMN 30mg 48my Appropriate,  03/27/22 for 90 days Entresto 49/51 Query Appropriate,  Getting from PAP, but she told me she hasn't renewed PAP for 2023, is still taking extras from 2022. Making me believe she is also non-compliant  on this as well -Medications previously tried: N/A  -Current home BP/HR readings:  05/06/22: 122/69 05/07/22: 143/74 05/08/22: 116/70 05/09/22: 118/66 05/10/22: 128/68 05/11/22: 123/71 05/12/22: 122/64 05/13/22: 143/79 05/14/22: 143/77 05/15/22: 104/72 05/16/22: 156/88 (Forgot meds) 05/17/22: 136/73 05/18/22: 104/59 Breakfast: Oatmeal Woke up at 0900, no lunch yet Drinking iced tea now, caffeinated.  -Current dietary habits: "Tries to eat healthy" -Current exercise habits: None -Educated on Benefits of medications for managing symptoms and prolonging life Sept 2023: Very non-compliant but she denies non-compliance. Will let PCP and Cardio know  Pre-Diabetes (A1c goal <6.5%) Lab Results  Component Value Date   HGBA1C 5.2 03/27/2022    HGBA1C 5.1 06/23/2021   HGBA1C 5.1 08/07/2020   Lab Results  Component Value Date   MICROALBUR 80 (H) 03/27/2022   LDLCALC 70 03/27/2022   CREATININE 1.35 (H) 03/27/2022   Lab Results  Component Value Date   NA 140 03/27/2022   K 3.5 03/27/2022   CREATININE 1.35 (H) 03/27/2022   EGFR 41 (L) 03/27/2022   GFRNONAA 72 09/24/2020   GLUCOSE 106 (H) 03/27/2022   Lab Results  Component Value Date   WBC 4.3 03/27/2022   HGB 13.1 03/27/2022   HCT 40.6 03/27/2022   MCV 87 03/27/2022   PLT 139 (L) 03/27/2022   Lab Results  Component Value Date   LABMICR See below: 06/23/2021   LABMICR See below: 08/28/2020   MICROALBUR 80 (H) 03/27/2022   MICROALBUR 80 (H) 06/23/2021  -Controlled -Current medications: None -Medications previously tried: N/A  -Current home glucose readings fasting glucose: N/A post prandial glucose: N/A -Denies hypoglycemic/hyperglycemic symptoms -Current exercise: none -Educated on Complications of diabetes including kidney damage, retinal damage, and cardiovascular disease; -Counseled to check feet daily and get yearly eye exams -Recommended to continue current medication  Depression/Anxiety (Goal: PHQ9<5) -Controlled -Current treatment: Bupropion 355m Appropriate, Effective, Safe, Accessible Quetiapine 1058mHS Appropriate, Effective, Safe, Accessible Sertraline 20062mppropriate, Effective, Safe, Accessible -Medications previously tried/failed: N/A -PHQ9:     05/18/2022   10:44 AM 03/27/2022    1:26 PM 11/25/2021    1:30 PM  Depression screen PHQ 2/9  Decreased Interest 2 1 0  Down, Depressed, Hopeless 0 1 0  PHQ - 2 Score 2 2 0  Altered sleeping 2 0 0  Tired, decreased energy 0 1 0  Change in appetite 0 0 0  Feeling bad or failure about yourself  _0 Trouble concentrating 0 0 0  Moving slowly or fidgety/restless 0 0 0  Suicidal thoughts 0 0 0  PHQ-9 Score _1 Difficult doing work/chores Not difficult at all    -GAD7:     03/27/2022     1:26 PM 11/25/2021    1:31 PM 10/23/2021    2:04 PM 07/21/2021    2:53 PM  GAD 7 : Generalized Anxiety Score  Nervous, Anxious, on Edge 1 1 0 0  Control/stop worrying 2 1 0 1  Worry too much - different things 2 1 0 1  Trouble relaxing 1 0 0 0  Restless 0 0 0 0  Easily annoyed or irritable 0 0 0 1  Afraid - awful might happen 0 0 0 0  Total GAD 7 Score 6 3 0 3  -Educated on Benefits of medication for symptom control -Recommended to continue current medication    COPD (Goal: control symptoms and prevent exacerbations) -Controlled -Current treatment  Albuterol Appropriate, Effective, Safe, Accessible Bevespi (PAP Approve 2023) Appropriate, Effective, Safe, Accessible -Medications  previously tried: N/A  -Gold Grade: Unknown -Current COPD Classification:  Unknown -MMRC/CAT score:      No data to display        -Pulmonary function testing:  PF Readings from Last 3 Encounters:  No data found for PF  -Exacerbations requiring treatment in last 6 months: 0 -Patient denies consistent use of maintenance inhaler -Frequency of rescue inhaler use: Patient Couldn't remember, poor historian -Counseled on Proper inhaler technique; Sept 2023: Will re-new PAP for 2024  Impaired Memory (Goal: slow progression)     No data to display        -Not ideally controlled -Current treatment  Donepezil 27m Query Appropriate,  -Medications previously tried: N/A  Sept 2023: Recommend MMSE  Patient Goals/Self-Care Activities Patient will:  - take medications as prescribed as evidenced by patient report and record review  Follow Up Plan: The patient has been provided with contact information for the care management team and has been advised to call with any health related questions or concerns.   CCM F/U Monthly for BP readings  NArizona Constable Pharm.D. - [phone]        Medication Assistance:  Entresto: Start PAP 05/18/22 Bevespi: Approved  2023  Compliance/Adherence/Medication fill history: Care Gaps: Zoster Vaccines:Never done OPHTHALMOLOGY EXAM:Last completed: Sep 17, 2015 FOOT EXAM:Last completed: Jan 18, 2020 HEMOGLOBIN A1C:Last completed: Jun 23, 2021   Star Rating Drugs: Carvedilol 12.5 mg Last filled:10/08/21 90 DS Donepezil 10 mg Last filled:02/12/22 30 DS Rosuvastatin 20 mg Last filled:09/30/21 90 DS Sacubitril-valsartan 49-51 mg Last filled:07/09/20 30 DS  [facility]  [facility] [place] [address] [address]Phone: [phone]: [phone] [facility] [place] [address] [place]Phone: [phone]: [phone] Uses pill box? No -   Pt endorses 90% compliance  Care Plan and Follow Up Patient Decision:  Patient agrees to Care Plan and Follow-up.  Plan: The patient has been provided with contact information for the care management team and has been advised to call with any health related questions or concerns.   CCM F/U Monthly for BP readings  NArizona Constable Pharm.D. -- [phone]

## 2022-05-18 NOTE — Patient Instructions (Signed)
[name] , Thank you for taking time to come for your Medicare Wellness Visit. I appreciate your ongoing commitment to your health goals. Please review the following plan we discussed and let me know if I can assist you in the future.   Screening recommendations/referrals: Colonoscopy: up to date Mammogram: up to date Bone Density: Education provided Recommended yearly ophthalmology/optometry visit for glaucoma screening and checkup Recommended yearly dental visit for hygiene and checkup  Vaccinations: Influenza vaccine: up to date Pneumococcal vaccine: up to date Tdap vaccine: up to date Shingles vaccine: Education provided    Advanced directives: on file  Conditions/risks identified:   Next appointment: 06-29-2022 @ 1:20  [facility] 74 Years and Older, Female Preventive care refers to lifestyle choices and visits with your health care provider that can promote health and wellness. What does preventive care include? A yearly physical exam. This is also called an annual well check. Dental exams once or twice a year. Routine eye exams. Ask your health care provider how often you should have your eyes checked. Personal lifestyle choices, including: Daily care of your teeth and gums. Regular physical activity. Eating a healthy diet. Avoiding tobacco and drug use. Limiting alcohol use. Practicing safe sex. Taking low-dose aspirin every day. Taking vitamin and mineral supplements as recommended by your health care provider. What happens during an annual well check? The services and screenings done by your health care provider during your annual well check will depend on your age, overall health, lifestyle risk factors, and family history of disease. Counseling  Your health care provider may ask you questions about your: Alcohol use. Tobacco use. Drug use. Emotional well-being. Home and relationship well-being. Sexual activity. Eating habits. History of  falls. Memory and ability to understand (cognition). Work and work Statistician. Reproductive health. Screening  You may have the following tests or measurements: Height, weight, and BMI. Blood pressure. Lipid and cholesterol levels. These may be checked every 5 years, or more frequently if you are over 4 years old. Skin check. Lung cancer screening. You may have this screening every year starting at age 71 if you have a 30-pack-year history of smoking and currently smoke or have quit within the past 15 years. Fecal occult blood test (FOBT) of the stool. You may have this test every year starting at age 69. Flexible sigmoidoscopy or colonoscopy. You may have a sigmoidoscopy every 5 years or a colonoscopy every 10 years starting at age 44. Hepatitis C blood test. Hepatitis B blood test. Sexually transmitted disease (STD) testing. Diabetes screening. This is done by checking your blood sugar (glucose) after you have not eaten for a while (fasting). You may have this done every 1-3 years. Bone density scan. This is done to screen for osteoporosis. You may have this done starting at age 60. Mammogram. This may be done every 1-2 years. Talk to your health care provider about how often you should have regular mammograms. Talk with your health care provider about your test results, treatment options, and if necessary, the need for more tests. Vaccines  Your health care provider may recommend certain vaccines, such as: Influenza vaccine. This is recommended every year. Tetanus, diphtheria, and acellular pertussis (Tdap, Td) vaccine. You may need a Td booster every 10 years. Zoster vaccine. You may need this after age 5. Pneumococcal 13-valent conjugate (PCV13) vaccine. One dose is recommended after age 32. Pneumococcal polysaccharide (PPSV23) vaccine. One dose is recommended after age 67. Talk to your health care provider  about which screenings and vaccines you need and how often you need  them. This information is not intended to replace advice given to you by your health care provider. Make sure you discuss any questions you have with your health care provider. Document Released: 09/13/2015 Document Revised: 05/06/2016 Document Reviewed: 06/18/2015 Elsevier Interactive Patient Education  2017 [place] Prevention in the Home Falls can cause injuries. They can happen to people of all ages. There are many things you can do to make your home safe and to help prevent falls. What can I do on the outside of my home? Regularly fix the edges of walkways and driveways and fix any cracks. Remove anything that might make you trip as you walk through a door, such as a raised step or threshold. Trim any bushes or trees on the path to your home. Use bright outdoor lighting. Clear any walking paths of anything that might make someone trip, such as rocks or tools. Regularly check to see if handrails are loose or broken. Make sure that both sides of any steps have handrails. Any raised decks and porches should have guardrails on the edges. Have any leaves, snow, or ice cleared regularly. Use sand or salt on walking paths during winter. Clean up any spills in your garage right away. This includes oil or grease spills. What can I do in the bathroom? Use night lights. Install grab bars by the toilet and in the tub and shower. Do not use towel bars as grab bars. Use non-skid mats or decals in the tub or shower. If you need to sit down in the shower, use a plastic, non-slip stool. Keep the floor dry. Clean up any water that spills on the floor as soon as it happens. Remove soap buildup in the tub or shower regularly. Attach bath mats securely with double-sided non-slip rug tape. Do not have throw rugs and other things on the floor that can make you trip. What can I do in the bedroom? Use night lights. Make sure that you have a light by your bed that is easy to reach. Do not use  any sheets or blankets that are too big for your bed. They should not hang down onto the floor. Have a firm chair that has side arms. You can use this for support while you get dressed. Do not have throw rugs and other things on the floor that can make you trip. What can I do in the kitchen? Clean up any spills right away. Avoid walking on wet floors. Keep items that you use a lot in easy-to-reach places. If you need to reach something above you, use a strong step stool that has a grab bar. Keep electrical cords out of the way. Do not use floor polish or wax that makes floors slippery. If you must use wax, use non-skid floor wax. Do not have throw rugs and other things on the floor that can make you trip. What can I do with my stairs? Do not leave any items on the stairs. Make sure that there are handrails on both sides of the stairs and use them. Fix handrails that are broken or loose. Make sure that handrails are as long as the stairways. Check any carpeting to make sure that it is firmly attached to the stairs. Fix any carpet that is loose or worn. Avoid having throw rugs at the top or bottom of the stairs. If you do have throw rugs, attach them to the floor  with carpet tape. Make sure that you have a light switch at the top of the stairs and the bottom of the stairs. If you do not have them, ask someone to add them for you. What else can I do to help prevent falls? Wear shoes that: Do not have high heels. Have rubber bottoms. Are comfortable and fit you well. Are closed at the toe. Do not wear sandals. If you use a stepladder: Make sure that it is fully opened. Do not climb a closed stepladder. Make sure that both sides of the stepladder are locked into place. Ask someone to hold it for you, if possible. Clearly mark and make sure that you can see: Any grab bars or handrails. First and last steps. Where the edge of each step is. Use tools that help you move around (mobility aids)  if they are needed. These include: Canes. Walkers. Scooters. Crutches. Turn on the lights when you go into a dark area. Replace any light bulbs as soon as they burn out. Set up your furniture so you have a clear path. Avoid moving your furniture around. If any of your floors are uneven, fix them. If there are any pets around you, be aware of where they are. Review your medicines with your doctor. Some medicines can make you feel dizzy. This can increase your chance of falling. Ask your doctor what other things that you can do to help prevent falls. This information is not intended to replace advice given to you by your health care provider. Make sure you discuss any questions you have with your health care provider. Document Released: 06/13/2009 Document Revised: 01/23/2016 Document Reviewed: 09/21/2014 Elsevier Interactive Patient Education  2017 [employer].

## 2022-05-18 NOTE — Progress Notes (Signed)
Subjective:   Sarah White is a 76 y.o. female who presents for Medicare Annual (Subsequent) preventive examination.  I connected with  [name] on 05/18/22 by a telephone enabled telemedicine application and verified that I am speaking with the correct person using two identifiers.   I discussed the limitations of evaluation and management by telemedicine. The patient expressed understanding and agreed to proceed.  Patient location: home  Provider location: Tele-Health-home  .   Review of Systems     Cardiac Risk Factors include: advanced age (>76mn, >>66women);diabetes mellitus;smoking/ tobacco exposure;sedentary lifestyle;family history of premature cardiovascular disease;hypertension     Objective:    Today's Vitals   There is no height or weight on file to calculate BMI.     05/18/2022   10:38 AM 09/10/2021    8:09 AM 04/25/2021    2:38 PM 07/01/2020    3:06 PM 04/22/2020    1:14 PM 04/22/2020    1:13 PM 03/08/2020   10:19 AM  Advanced Directives  Does Patient Have a Medical Advance Directive? Yes Yes Yes Yes Yes Yes Yes  Type of AParamedicof [place]Wilson[place]oc[place] will [place]ai[place] Gardens[place] will Living will Living will   Copy of [place] Chart? Yes - validated most recent copy scanned in chart (See row information)  Yes - validated most recent copy scanned in chart (See row information)        Current Medications (verified) Outpatient Encounter Medications as of 05/18/2022  Medication Sig   albuterol (ACCUNEB) 0.63 MG/3ML nebulizer solution Take 3 mLs (0.63 mg total) by nebulization every 6 (six) hours as needed for wheezing.   albuterol (VENTOLIN HFA) 108 (90 Base) MCG/ACT inhaler Inhale 2 puffs into the lungs every 6 (six) hours as needed for wheezing or shortness of breath.   amiodarone (PACERONE) 200 MG tablet Take 0.5 tablets by mouth daily.   aspirin EC 81 MG tablet Take  81 mg by mouth at bedtime.    buPROPion (WELLBUTRIN XL) 300 MG 24 hr tablet Take 1 tablet (300 mg total) by mouth daily.   carvedilol (COREG) 12.5 MG tablet Take 12.5 mg by mouth daily.   clopidogrel (PLAVIX) 75 MG tablet Take 1 tablet (75 mg total) by mouth daily at 6 (six) AM.   cyclobenzaprine (FLEXERIL) 10 MG tablet Take 1 tablet by mouth three times daily as needed for muscle spasm   diphenhydrAMINE (BENADRYL) 25 MG tablet Take 25 mg by mouth at bedtime as needed for allergies. Dies not take regularly   donepezil (ARICEPT) 10 MG tablet TAKE 1 TABLET BY MOUTH NIGHTLY FOR 30 DAYS   furosemide (LASIX) 40 MG tablet Take 1 tablet (40 mg total) by mouth daily.   Glycopyrrolate-Formoterol (BEVESPI AEROSPHERE) 9-4.8 MCG/ACT AERO Inhale 2 puffs into the lungs 2 (two) times daily.   isosorbide mononitrate (IMDUR) 30 MG 24 hr tablet Take 1 tablet (30 mg total) by mouth daily.   lidocaine (LIDODERM) 5 % Place 1 patch onto the skin daily. Remove & Discard patch within 12 hours or as directed by MD   nitroGLYCERIN (NITROSTAT) 0.4 MG SL tablet DISSOLVE ONE TABLET UNDER THE TONGUE EVERY 5 MINUTES AS NEEDED FOR CHEST PAIN.  DO NOT EXCEED A TOTAL OF 3 DOSES IN 15 MINUTES   QUEtiapine (SEROQUEL XR) 50 MG TB24 24 hr tablet Take 2 tablets (100 mg total) by mouth at bedtime.   rosuvastatin (CRESTOR) 20 MG tablet  Take 1 tablet (20 mg total) by mouth daily.   sacubitril-valsartan (ENTRESTO) 49-51 MG Take 1 tablet by mouth 2 (two) times daily.    sertraline (ZOLOFT) 100 MG tablet Take 2 tablets (200 mg total) by mouth daily.   silver sulfADIAZINE (SILVADENE) 1 % cream Apply 1 application topically daily. (Patient taking differently: Apply 1 application  topically as needed.)   traZODone (DESYREL) 50 MG tablet Take 1 tablet (50 mg total) by mouth at bedtime as needed. for sleep   triamcinolone ointment (KENALOG) 0.5 % Apply 1 application topically 2 (two) times daily.   pantoprazole (PROTONIX) 40 MG tablet Take 1  tablet (40 mg total) by mouth 2 (two) times daily before a meal.   No facility-administered encounter medications on file as of 05/18/2022.    Allergies (verified) Amlodipine, Atorvastatin, Codeine, Hctz [hydrochlorothiazide], Lisinopril, and Metformin and related   History: Past Medical History:  Diagnosis Date   Acute pancreatitis 09/09/2018   Acute respiratory failure with hypoxia and hypercarbia (HCC) 07/22/2015   Cancer ([place])    uterine   CHF (congestive heart failure) (HCC)    COPD (chronic obstructive pulmonary disease) ([place])    Diabetes mellitus without complication ([place])    Encephalopathy acute 07/22/2015   Gastric ulcer without hemorrhage or perforation    Hypertension    Pneumonia    November 2016   Pulmonary edema 07/22/2015   Squamous cell cancer of skin of upper arm, right    Past Surgical History:  Procedure Laterality Date   CAROTID PTA/STENT INTERVENTION Right 03/06/2020   Procedure: CAROTID PTA/STENT INTERVENTION;  Surgeon: Katha Cabal, MD;  Location: [facility];  Service: Cardiovascular;  Laterality: Right;   CARPAL TUNNEL RELEASE Bilateral    CESAREAN SECTION     CHOLECYSTECTOMY N/A 09/12/2018   Procedure: LAPAROSCOPIC CHOLECYSTECTOMY WITH INTRAOPERATIVE CHOLANGIOGRAM;  Surgeon: Herbert Pun, MD;  Location: [facility];  Service: General;  Laterality: N/A;   COLONOSCOPY WITH PROPOFOL N/A 09/10/2021   Procedure: COLONOSCOPY WITH PROPOFOL;  Surgeon: Lin Landsman, MD;  Location: [facility];  Service: Gastroenterology;  Laterality: N/A;   CORONARY ANGIOPLASTY WITH STENT PLACEMENT     ESOPHAGOGASTRODUODENOSCOPY N/A 09/10/2021   Procedure: ESOPHAGOGASTRODUODENOSCOPY (EGD);  Surgeon: Lin Landsman, MD;  Location: [facility];  Service: Gastroenterology;  Laterality: N/A;   KNEE SURGERY Left    LEFT HEART CATH AND CORONARY ANGIOGRAPHY Right 11/26/2016   Procedure: Left Heart Cath and Coronary Angiography;  Surgeon: Isaias Cowman, MD;  Location: [facility];  Service: Cardiovascular;  Laterality: Right;   LEFT HEART CATH AND CORONARY ANGIOGRAPHY N/A 01/23/2020   Procedure: LEFT HEART CATH AND CORONARY ANGIOGRAPHY;  Surgeon: Yolonda Kida, MD;  Location: [place] [facility];  Service: Cardiovascular;  Laterality: N/A;   TOTAL ABDOMINAL HYSTERECTOMY     Family History  Problem Relation Age of Onset   Heart disease Mother    Social History   Socioeconomic History   Marital status: Divorced    Spouse name: Not on file   Number of children: Not on file   Years of education: Not on file   Highest education level: Some college, no degree  Occupational History   Occupation: retired  Tobacco Use   Smoking status: Every Day    Packs/day: 1.00    Types: Cigarettes   Smokeless tobacco: Never  Vaping Use   Vaping Use: Never used  Substance and Sexual Activity   Alcohol use: No   Drug use: No  Sexual activity: Not Currently  Other Topics Concern   Not on file  Social History Narrative   Not on file   Social Determinants of Health   Financial Resource Strain: Low Risk  (04/25/2021)   Overall Financial Resource Strain (CARDIA)    Difficulty of Paying Living Expenses: Not hard at all  Food Insecurity: No Food Insecurity (05/18/2022)   Hunger Vital Sign    Worried About Running Out of Food in the Last Year: Never true    Ran Out of Food in the Last Year: Never true  Transportation Needs: No Transportation Needs (05/18/2022)   PRAPARE - Hydrologist (Medical): No    Lack of Transportation (Non-Medical): No  Physical Activity: Inactive (05/18/2022)   Exercise Vital Sign    Days of Exercise per Week: 0 days    Minutes of Exercise per Session: 0 min  Stress: No Stress Concern Present (05/18/2022)   [place]    Feeling of Stress : Not at all  Social Connections: Socially Isolated (05/18/2022)    Social Connection and Isolation Panel [NHANES]    Frequency of Communication with Friends and Family: More than three times a week    Frequency of Social Gatherings with Friends and Family: Once a week    Attends Religious Services: Never    Marine scientist or Organizations: No    Attends Archivist Meetings: Never    Marital Status: Widowed    Tobacco Counseling Ready to quit: Not Answered Counseling given: Not Answered   Clinical Intake:  Pre-visit preparation completed: Yes  Pain : No/denies pain     Diabetes: Yes CBG done?: No  How often do you need to have someone help you when you read instructions, pamphlets, or other written materials from your doctor or pharmacy?: 1 - Never  Diabetic?  Yes  Nutrition Risk Assessment:  Has the patient had any N/V/D within the last 2 months?  No  Does the patient have any non-healing wounds?  No  Has the patient had any unintentional weight loss or weight gain?  No   Diabetes:  Is the patient diabetic?  Yes  If diabetic, was a CBG obtained today?  No  Did the patient bring in their glucometer from home?  No  How often do you monitor your CBG's? 1 x a day.   Financial Strains and Diabetes Management:  Are you having any financial strains with the device, your supplies or your medication? No .  Does the patient want to be seen by Chronic Care Management for management of their diabetes?  No  Would the patient like to be referred to a Nutritionist or for Diabetic Management?  No   Diabetic Exams:  Diabetic Eye Exam:. Pt has been advised about the importance in completing this exam. A referral has been placed today.   Diabetic Foot Exam: Pt has been advised about the importance in completing this exam.   Interpreter Needed?: No  Information entered by :: Leroy Kennedy LPN   Activities of Daily Living    05/18/2022   10:46 AM  In your present state of health, do you have any difficulty performing the  following activities:  Hearing? 1  Vision? 0  Difficulty concentrating or making decisions? 0  Walking or climbing stairs? 1  Dressing or bathing? 0  Doing errands, shopping? 1  Preparing Food and eating ? N  Using the Toilet? N  In the past six months, have you accidently leaked urine? Y  Do you have problems with loss of bowel control? N  Managing your Medications? N  Managing your Finances? Y  Housekeeping or managing your Housekeeping? N    [name], [name] Yolonda Kida, MD as Consulting Physician (Cardiology) Yolonda Kida, MD as Consulting Physician (Cardiology) Madelin Rear, [facility] (Inactive) as Pharmacist (General Practice)  Indicate any recent Medical Services you may have received from other than [facility] providers in the past year (date may be approximate).     Assessment:   This is a routine wellness examination for Sarah White.  Hearing/Vision screen Hearing Screening - Comments:: Is hard of hearing  No hearing aids Vision Screening - Comments:: Not up to date   Dietary issues and exercise activities discussed: Current Exercise Habits: The patient does not participate in regular exercise at present   Goals Addressed             This Visit's Progress    Patient Stated       Patient states she may try and get hearing aids       Depression Screen    05/18/2022   10:44 AM 03/27/2022    1:26 PM 11/25/2021    1:30 PM 10/23/2021    2:04 PM 10/23/2021    1:55 PM 07/21/2021    2:52 PM 06/23/2021    3:00 PM  PHQ 2/9 Scores  PHQ - 2 Score 2 2 0 '2 2 4 6  '$ PHQ- 9 Score '5 5 1 4 6 10 17    '$ Fall Risk    05/18/2022   10:39 AM 03/27/2022    1:26 PM 11/25/2021    1:31 PM 10/23/2021    2:05 PM 04/25/2021    2:38 PM  Fall Risk   Falls in the past year?  '1 1 1 1  '$ Comment     lost balance  Number falls in past yr: '1 1 1 1 1  '$ Injury with Fall? 1 0 1 1 0  Risk for fall due to : Impaired balance/gait History  of fall(s) History of fall(s) History of fall(s);Impaired balance/gait Impaired balance/gait;Impaired mobility;Medication side effect;History of fall(s)  Follow up Falls evaluation completed;Falls prevention discussed;Education provided Falls evaluation completed Falls evaluation completed Falls evaluation completed Falls evaluation completed;Education provided;Falls prevention discussed    FALL RISK PREVENTION PERTAINING TO THE HOME:  Any stairs in or around the home? No  If so, are there any without handrails? No  Home free of loose throw rugs in walkways, pet beds, electrical cords, etc? Yes  Adequate lighting in your home to reduce risk of falls? Yes   ASSISTIVE DEVICES UTILIZED TO PREVENT FALLS:  Life alert? No  Use of a cane, walker or w/c? Yes  Grab bars in the bathroom? Yes  Shower chair or bench in shower? Yes  Elevated toilet seat or a handicapped toilet? Yes   TIMED UP AND GO:  Was the test performed? No .    Cognitive Function:        05/18/2022   10:41 AM 04/25/2021    2:42 PM 04/22/2020    1:22 PM 02/15/2019    2:46 PM 09/30/2017    3:31 PM  6CIT Screen  What Year? 0 points 0 points 0 points 0 points 0 points  What month? 0 points 0 points 0 points 0 points 0 points  What time?  0 points 3  points 0 points 0 points  Count back from 20 2 points 0 points 0 points 0 points 0 points  Months in reverse 0 points 0 points 4 points 0 points 0 points  Repeat phrase 0 points 2 points 4 points 2 points 0 points  Total Score  2 points 11 points 2 points 0 points    Immunizations Immunization History  Administered Date(s) Administered   Fluad Quad(high Dose 65+) 04/28/2019, 05/24/2020, 06/23/2021   Influenza, High Dose Seasonal PF 05/18/2016, 05/25/2017, 05/05/2018   Influenza, Seasonal, Injecte, Preservative Fre 06/05/2015   PFIZER(Purple Top)SARS-COV-2 Vaccination 10/16/2019, 11/06/2019, 09/24/2020   Pneumococcal Conjugate-13 07/29/2015   Pneumococcal  Polysaccharide-23 09/15/2016   Tdap 09/24/2015   Zoster, Live 02/07/2015    TDAP status: Up to date  Flu Vaccine status: Up to date  Pneumococcal vaccine status: Up to date  Covid-19 vaccine status: Information provided on how to obtain vaccines.   Qualifies for Shingles Vaccine? Yes   Zostavax completed Yes   Shingrix Completed?: No.    Education has been provided regarding the importance of this vaccine. Patient has been advised to call insurance company to determine out of pocket expense if they have not yet received this vaccine. Advised may also receive vaccine at local pharmacy or Health Dept. Verbalized acceptance and understanding.  Screening Tests Health Maintenance  Topic Date Due   DEXA SCAN  Never done   OPHTHALMOLOGY EXAM  09/16/2016   INFLUENZA VACCINE  03/31/2022   COVID-19 Vaccine (4 - Pfizer risk series) 06/03/2022 (Originally 11/19/2020)   MAMMOGRAM  06/23/2022 (Originally 07/05/1998)   Zoster Vaccines- Shingrix (1 of 2) 08/17/2022 (Originally 07/06/1967)   HEMOGLOBIN A1C  09/27/2022   Diabetic kidney evaluation - GFR measurement  03/28/2023   Diabetic kidney evaluation - Urine ACR  03/28/2023   FOOT EXAM  03/28/2023   COLONOSCOPY (Pts 45-48yr Insurance coverage will need to be confirmed)  09/10/2024   TETANUS/TDAP  09/23/2025   Pneumonia Vaccine 76 Years old  Completed   Hepatitis C Screening  Completed   HPV VACCINES  Aged Out    Health Maintenance  Health Maintenance Due  Topic Date Due   DEXA SCAN  Never done   OPHTHALMOLOGY EXAM  09/16/2016   INFLUENZA VACCINE  03/31/2022    Colorectal cancer screening: Type of screening: Colonoscopy. Completed 2023. Repeat every 3 years  Mammogram  declined  Bone Density  decline  Lung Cancer Screening: (Low Dose CT Chest recommended if Age 74-80years, 30 pack-year currently smoking OR have quit w/in 15years.) does qualify.   Lung Cancer Screening Referral:   Additional Screening:  Hepatitis C  Screening: does not qualify; Completed 2016  Vision Screening: Recommended annual ophthalmology exams for early detection of glaucoma and other disorders of the eye. Is the patient up to date with their annual eye exam?  No  Who is the provider or what is the name of the office in which the patient attends annual eye exams?  If pt is not established with a provider, would they like to be referred to a provider to establish care? No .   Dental Screening: Recommended annual dental exams for proper oral hygiene  Community Resource Referral / Chronic Care Management: CRR required this visit?  No   CCM required this visit?  No      Plan:     I have personally reviewed and noted the following in the patient's chart:   Medical and social history Use of alcohol, tobacco or illicit  drugs  Current medications and supplements including opioid prescriptions. Patient is not currently taking opioid prescriptions. Functional ability and status Nutritional status Physical activity Advanced directives List of other physicians Hospitalizations, surgeries, and ER visits in previous 12 months Vitals Screenings to include cognitive, depression, and falls Referrals and appointments  In addition, I have reviewed and discussed with patient certain preventive protocols, quality metrics, and best practice recommendations. A written personalized care plan for preventive services as well as general preventive health recommendations were provided to patient.     Leroy Kennedy, LPN   04/27/8336   Nurse Notes: patient will discuss low dose ct at visit in 05-2022  states she thought she had it, I do not see anything as completed.

## 2022-05-28 ENCOUNTER — Encounter: Payer: Self-pay | Admitting: Family Medicine

## 2022-05-28 ENCOUNTER — Ambulatory Visit (INDEPENDENT_AMBULATORY_CARE_PROVIDER_SITE_OTHER): Payer: Medicare Other | Admitting: Family Medicine

## 2022-05-28 VITALS — BP 144/78 | HR 84 | Temp 98.3°F | Wt 167.4 lb

## 2022-05-28 DIAGNOSIS — I129 Hypertensive chronic kidney disease with stage 1 through stage 4 chronic kidney disease, or unspecified chronic kidney disease: Secondary | ICD-10-CM | POA: Diagnosis not present

## 2022-05-28 DIAGNOSIS — I200 Unstable angina: Secondary | ICD-10-CM

## 2022-05-28 DIAGNOSIS — R112 Nausea with vomiting, unspecified: Secondary | ICD-10-CM

## 2022-05-28 DIAGNOSIS — Z23 Encounter for immunization: Secondary | ICD-10-CM

## 2022-05-28 MED ORDER — ALBUTEROL SULFATE 0.63 MG/3ML IN NEBU: 1.0000 | INHALATION_SOLUTION | Freq: Four times a day (QID) | RESPIRATORY_TRACT | 6 refills | Status: DC | PRN

## 2022-05-28 MED ORDER — ROSUVASTATIN CALCIUM 20 MG PO TABS: 20.0000 mg | ORAL_TABLET | Freq: Every day | ORAL | 1 refills | Status: DC

## 2022-05-28 MED ORDER — PROMETHAZINE HCL 25 MG RE SUPP: 25.0000 mg | Freq: Four times a day (QID) | RECTAL | 3 refills | Status: DC | PRN

## 2022-05-28 NOTE — Assessment & Plan Note (Signed)
Has not been taking her medicine because she gets dizzy and nauseous when she takes it and has fallen. Has not taken any of her medicine in 2 days and BP only slightly elevated. Will stop imdur and continue other medicines and recheck 2 weeks. Call with any concerns.

## 2022-05-28 NOTE — Patient Instructions (Signed)
[employer] [name]) refills: patient to call [phone]

## 2022-05-28 NOTE — Progress Notes (Signed)
BP (!) 144/78   Pulse 84   Temp 98.3 F (36.8 C)   Wt 167 lb 6.4 oz (75.9 kg)   SpO2 96%   BMI 28.73 kg/m    Subjective:    [name], female    DOB: 1946/04/24, 76 y.o.   MRN: [id number]  HPI: Sarah White is a 76 y.o. female  Chief Complaint  Patient presents with   Hypotension    Patient states her BP drops sometimes, not every day. States it is not dropping as much since her medication was changed.    Has not been taking any of her medicine in 2 days because she has been throwing up and getting dizzy. She notes that she often doesn't take her medicine because she feels this way when she takes her medicine. She is not on any diabetes medicine any more due to hypoglycemia which was contributing to this. Denies and leg swelling or SOB. She is otherwise doing OK. Denies any passing out. No other concerns or complaints at this time.   Relevant past medical, surgical, family and social history reviewed and updated as indicated. Interim medical history since our last visit reviewed. Allergies and medications reviewed and updated.  Review of Systems  Constitutional:  Positive for fatigue. Negative for activity change, appetite change, chills, diaphoresis, fever and unexpected weight change.  Respiratory: Negative.    Cardiovascular: Negative.   Gastrointestinal:  Positive for vomiting. Negative for abdominal distention, abdominal pain, anal bleeding, blood in stool, constipation, diarrhea, nausea and rectal pain.  Genitourinary: Negative.   Musculoskeletal: Negative.   Skin: Negative.   Neurological:  Positive for dizziness. Negative for tremors, seizures, syncope, facial asymmetry, speech difficulty, weakness, light-headedness, numbness and headaches.  Psychiatric/Behavioral: Negative.      Per HPI unless specifically indicated above     Objective:    BP (!) 144/78   Pulse 84   Temp 98.3 F (36.8 C)   Wt 167 lb 6.4 oz (75.9 kg)   SpO2 96%   BMI 28.73  kg/m   Wt Readings from Last 3 Encounters:  05/28/22 167 lb 6.4 oz (75.9 kg)  03/27/22 159 lb 9.6 oz (72.4 kg)  11/25/21 161 lb 3.2 oz (73.1 kg)    Physical Exam Vitals and nursing note reviewed.  Constitutional:      General: She is not in acute distress.    Appearance: Normal appearance. She is not ill-appearing, toxic-appearing or diaphoretic.  HENT:     Head: Normocephalic and atraumatic.     Right Ear: External ear normal.     Left Ear: External ear normal.     Nose: Nose normal.     Mouth/Throat:     Mouth: Mucous membranes are moist.     Pharynx: Oropharynx is clear.  Eyes:     General: No scleral icterus.       Right eye: No discharge.        Left eye: No discharge.     Extraocular Movements: Extraocular movements intact.     Conjunctiva/sclera: Conjunctivae normal.     Pupils: Pupils are equal, round, and reactive to light.  Cardiovascular:     Rate and Rhythm: Normal rate and regular rhythm.     Pulses: Normal pulses.     Heart sounds: Normal heart sounds. No murmur heard.    No friction rub. No gallop.  Pulmonary:     Effort: Pulmonary effort is normal. No respiratory distress.     Breath  sounds: Normal breath sounds. No stridor. No wheezing, rhonchi or rales.  Chest:     Chest wall: No tenderness.  Musculoskeletal:        General: Normal range of motion.     Cervical back: Normal range of motion and neck supple.  Skin:    General: Skin is warm and dry.     Capillary Refill: Capillary refill takes less than 2 seconds.     Coloration: Skin is not jaundiced or pale.     Findings: No bruising, erythema, lesion or rash.  Neurological:     General: No focal deficit present.     Mental Status: She is alert and oriented to person, place, and time. Mental status is at baseline.  Psychiatric:        Mood and Affect: Mood normal.        Behavior: Behavior normal.        Thought Content: Thought content normal.        Judgment: Judgment normal.     Results for  orders placed or performed in visit on 03/27/22  Bayer DCA Hb A1c Waived  Result Value Ref Range   HB A1C (BAYER DCA - WAIVED) 5.2 4.8 - 5.6 %  CBC with Differential/Platelet  Result Value Ref Range   WBC 4.3 3.4 - 10.8 x10E3/uL   RBC 4.66 3.77 - 5.28 x10E6/uL   Hemoglobin 13.1 11.1 - 15.9 g/dL   Hematocrit 40.6 34.0 - 46.6 %   MCV 87 79 - 97 fL   MCH 28.1 26.6 - 33.0 pg   MCHC 32.3 31.5 - 35.7 g/dL   RDW 14.0 11.7 - 15.4 %   Platelets 139 (L) 150 - 450 x10E3/uL   Neutrophils 71 Not Estab. %   Lymphs 19 Not Estab. %   Monocytes 8 Not Estab. %   Eos 1 Not Estab. %   Basos 1 Not Estab. %   Neutrophils Absolute 3.1 1.4 - 7.0 x10E3/uL   Lymphocytes Absolute 0.8 0.7 - 3.1 x10E3/uL   Monocytes Absolute 0.3 0.1 - 0.9 x10E3/uL   EOS (ABSOLUTE) 0.0 0.0 - 0.4 x10E3/uL   Basophils Absolute 0.0 0.0 - 0.2 x10E3/uL   Immature Granulocytes 0 Not Estab. %   Immature Grans (Abs) 0.0 0.0 - 0.1 x10E3/uL  Comprehensive metabolic panel  Result Value Ref Range   Glucose 106 (H) 70 - 99 mg/dL   BUN 16 8 - 27 mg/dL   Creatinine, Ser 1.35 (H) 0.57 - 1.00 mg/dL   eGFR 41 (L) >59 mL/min/1.73   BUN/Creatinine Ratio 12 12 - 28   Sodium 140 134 - 144 mmol/L   Potassium 3.5 3.5 - 5.2 mmol/L   Chloride 100 96 - 106 mmol/L   CO2 24 20 - 29 mmol/L   Calcium 8.9 8.7 - 10.3 mg/dL   Total Protein 5.9 (L) 6.0 - 8.5 g/dL   Albumin 3.9 3.8 - 4.8 g/dL   Globulin, Total 2.0 1.5 - 4.5 g/dL   Albumin/Globulin Ratio 2.0 1.2 - 2.2   Bilirubin Total 0.3 0.0 - 1.2 mg/dL   Alkaline Phosphatase 104 44 - 121 IU/L   AST 16 0 - 40 IU/L   ALT 11 0 - 32 IU/L  Lipid Panel w/o Chol/HDL Ratio  Result Value Ref Range   Cholesterol, Total 141 100 - 199 mg/dL   Triglycerides 134 0 - 149 mg/dL   HDL 47 >39 mg/dL   VLDL Cholesterol Cal 24 5 - 40 mg/dL   LDL Chol Calc (  NIH) 70 0 - 99 mg/dL  Microalbumin, Urine Waived  Result Value Ref Range   Microalb, Ur Waived 80 (H) 0 - 19 mg/L   Creatinine, Urine Waived 200 10 - 300  mg/dL   Microalb/Creat Ratio <30 <30 mg/g      Assessment & Plan:   Problem List Items Addressed This Visit       Genitourinary   Hypertensive renal disease - Primary    Has not been taking her medicine because she gets dizzy and nauseous when she takes it and has fallen. Has not taken any of her medicine in 2 days and BP only slightly elevated. Will stop imdur and continue other medicines and recheck 2 weeks. Call with any concerns.       Other Visit Diagnoses     Nausea and vomiting, unspecified vomiting type       Concern for hypotension contributing to nausea. Will cut down on BP medicine. Phenergan suppositories given today. Recheck 2 weeks. Call with any concerns.    Need for influenza vaccination       Relevant Orders   Flu Vaccine QUAD High Dose(Fluad) (Completed)        Follow up plan: Return in about 2 weeks (around 06/11/2022).

## 2022-05-29 ENCOUNTER — Telehealth: Payer: Self-pay | Admitting: Family Medicine

## 2022-05-29 MED ORDER — ALBUTEROL SULFATE 0.63 MG/3ML IN NEBU: 1.0000 | INHALATION_SOLUTION | Freq: Four times a day (QID) | RESPIRATORY_TRACT | 6 refills | Status: DC | PRN

## 2022-05-29 NOTE — Telephone Encounter (Signed)
Wells Guiles calling from [place] is calling to report that albuterol (ACCUNEB) 0.63 MG/3ML nebulizer solution [[id number]]  needs a diagnostic code in the sig with a . For the insurance to pay for the medication.  Cb- [place]

## 2022-05-29 NOTE — Telephone Encounter (Signed)
Spoke with pharmacist to provide them with the diagnosis code. Pharmacist says patient needs a new Rx with diagnosis code in the instructions.

## 2022-05-29 NOTE — Telephone Encounter (Signed)
COPD

## 2022-05-30 DIAGNOSIS — I2510 Atherosclerotic heart disease of native coronary artery without angina pectoris: Secondary | ICD-10-CM

## 2022-05-30 DIAGNOSIS — J449 Chronic obstructive pulmonary disease, unspecified: Secondary | ICD-10-CM | POA: Diagnosis not present

## 2022-05-30 DIAGNOSIS — I501 Left ventricular failure: Secondary | ICD-10-CM | POA: Diagnosis not present

## 2022-05-30 DIAGNOSIS — E785 Hyperlipidemia, unspecified: Secondary | ICD-10-CM

## 2022-05-30 DIAGNOSIS — F17210 Nicotine dependence, cigarettes, uncomplicated: Secondary | ICD-10-CM | POA: Diagnosis not present

## 2022-05-30 DIAGNOSIS — F32A Depression, unspecified: Secondary | ICD-10-CM | POA: Diagnosis not present

## 2022-06-01 ENCOUNTER — Encounter: Payer: Self-pay | Admitting: Family Medicine

## 2022-06-11 ENCOUNTER — Ambulatory Visit: Payer: Medicare Other | Admitting: Family Medicine

## 2022-06-11 MED ORDER — ONDANSETRON HCL 8 MG PO TABS: 8.0000 mg | ORAL_TABLET | Freq: Three times a day (TID) | ORAL | 3 refills | Status: DC | PRN

## 2022-06-11 MED ORDER — ALBUTEROL SULFATE HFA 108 (90 BASE) MCG/ACT IN AERS: 2.0000 | INHALATION_SPRAY | Freq: Four times a day (QID) | RESPIRATORY_TRACT | 6 refills | Status: DC | PRN

## 2022-06-15 ENCOUNTER — Telehealth: Payer: Self-pay

## 2022-06-15 NOTE — Progress Notes (Signed)
Chronic Care Management Pharmacy Assistant   Name: Sarah White  MRN: [id number] DOB: 1945/11/05   Reason for Encounter: Disease State   Conditions to be addressed/monitored: HTN   Recent office visits:  05/28/22 Sarah Roys, DO (HTN) Orders placed: none; Medication changes: Phomethazine 25 mg  Recent consult visits:  None since last coordination call  Hospital visits:  None in previous 6 months  Medications: Outpatient Encounter Medications as of 06/15/2022  Medication Sig Note   albuterol (ACCUNEB) 0.63 MG/3ML nebulizer solution Take 3 mLs (0.63 mg total) by nebulization every 6 (six) hours as needed for wheezing. Dx j44.9    albuterol (VENTOLIN HFA) 108 (90 Base) MCG/ACT inhaler Inhale 2 puffs into the lungs every 6 (six) hours as needed for wheezing or shortness of breath.    amiodarone (PACERONE) 200 MG tablet Take 0.5 tablets by mouth daily.    aspirin EC 81 MG tablet Take 81 mg by mouth at bedtime.     buPROPion (WELLBUTRIN XL) 300 MG 24 hr tablet Take 1 tablet (300 mg total) by mouth daily.    carvedilol (COREG) 12.5 MG tablet Take 12.5 mg by mouth daily.    clopidogrel (PLAVIX) 75 MG tablet Take 1 tablet (75 mg total) by mouth daily at 6 (six) AM.    cyclobenzaprine (FLEXERIL) 10 MG tablet Take 1 tablet by mouth three times daily as needed for muscle spasm    diphenhydrAMINE (BENADRYL) 25 MG tablet Take 25 mg by mouth at bedtime as needed for allergies. Dies not take regularly 04/18/2020: Taking 1 QPM   donepezil (ARICEPT) 10 MG tablet TAKE 1 TABLET BY MOUTH NIGHTLY FOR 30 DAYS    furosemide (LASIX) 40 MG tablet Take 1 tablet (40 mg total) by mouth daily.    Glycopyrrolate-Formoterol (BEVESPI AEROSPHERE) 9-4.8 MCG/ACT AERO Inhale 2 puffs into the lungs 2 (two) times daily. 12/22/2021: Patient assistance   lidocaine (LIDODERM) 5 % Place 1 patch onto the skin daily. Remove & Discard patch within 12 hours or as directed by MD    nitroGLYCERIN (NITROSTAT) 0.4 MG SL  tablet DISSOLVE ONE TABLET UNDER THE TONGUE EVERY 5 MINUTES AS NEEDED FOR CHEST PAIN.  DO NOT EXCEED A TOTAL OF 3 DOSES IN 15 MINUTES    ondansetron (ZOFRAN) 8 MG tablet Take 1 tablet (8 mg total) by mouth every 8 (eight) hours as needed for nausea or vomiting.    pantoprazole (PROTONIX) 40 MG tablet Take 1 tablet (40 mg total) by mouth 2 (two) times daily before a meal.    promethazine (PHENERGAN) 25 MG suppository Place 1 suppository (25 mg total) rectally every 6 (six) hours as needed for nausea or vomiting.    QUEtiapine (SEROQUEL XR) 50 MG TB24 24 hr tablet Take 2 tablets (100 mg total) by mouth at bedtime.    rosuvastatin (CRESTOR) 20 MG tablet Take 1 tablet (20 mg total) by mouth daily.    sacubitril-valsartan (ENTRESTO) 49-51 MG Take 1 tablet by mouth 2 (two) times daily.     sertraline (ZOLOFT) 100 MG tablet Take 2 tablets (200 mg total) by mouth daily.    traZODone (DESYREL) 50 MG tablet Take 1 tablet (50 mg total) by mouth at bedtime as needed. for sleep    triamcinolone ointment (KENALOG) 0.5 % Apply 1 application topically 2 (two) times daily.    No facility-administered encounter medications on file as of 06/15/2022.   Reviewed chart prior to disease state call. Spoke with patient regarding BP  Recent  Office Vitals: BP Readings from Last 3 Encounters:  05/28/22 (!) 144/78  03/27/22 103/69  11/25/21 123/75   Pulse Readings from Last 3 Encounters:  05/28/22 84  03/27/22 69  11/25/21 72    Wt Readings from Last 3 Encounters:  05/28/22 167 lb 6.4 oz (75.9 kg)  03/27/22 159 lb 9.6 oz (72.4 kg)  11/25/21 161 lb 3.2 oz (73.1 kg)     Kidney Function Lab Results  Component Value Date/Time   CREATININE 1.35 (H) 03/27/2022 01:33 PM   CREATININE 0.99 06/23/2021 03:18 PM   CREATININE 0.52 (L) 01/15/2014 03:56 PM   GFRNONAA 72 09/24/2020 02:02 PM   GFRNONAA >60 01/15/2014 03:56 PM   GFRAA 83 09/24/2020 02:02 PM   GFRAA >60 01/15/2014 03:56 PM       Latest Ref Rng & Units  03/27/2022    1:33 PM 06/23/2021    3:18 PM 09/24/2020    2:02 PM  BMP  Glucose 70 - 99 mg/dL 106  89  115   BUN 8 - 27 mg/dL '16  14  10   '$ Creatinine 0.57 - 1.00 mg/dL 1.35  0.99  0.82   BUN/Creat Ratio 12 - '28 12  14  12   '$ Sodium 134 - 144 mmol/L 140  140  146   Potassium 3.5 - 5.2 mmol/L 3.5  3.8  4.0   Chloride 96 - 106 mmol/L 100  99  106   CO2 20 - 29 mmol/L '24  24  21   '$ Calcium 8.7 - 10.3 mg/dL 8.9  9.3  9.2     Current antihypertensive regimen:  Imdur Pt no longer takes  How often are you checking your Blood Pressure? twice daily  Current home BP readings: 143/75  What recent interventions/DTPs have been made by any provider to improve Blood Pressure control since last CPP Visit: Stopped Imdur, per [place], 05/28/22  Any recent hospitalizations or ED visits since last visit with CPP? No  What diet changes have been made to improve Blood Pressure Control?  Patient states that she has not made any changes to how she eats  What exercise is being done to improve your Blood Pressure Control?  Patient states she is not exercising  Adherence Review: Is the patient currently on ACE/ARB medication? Yes Does the patient have >5 day gap between last estimated fill dates? Yes   Care Gaps: Colonoscopy-09/10/21 Diabetic Foot Exam-03/27/22 Mammogram-Patient declined Ophthalmology-09/17/15 Dexa Scan - NA Annual Well Visit - 05/18/22 (Medicare) Micro albumin-03/27/22 Hemoglobin A1c- 03/27/22 (5.2) 06/23/21 (5.1)  Star Rating Drugs: Carvedilol 12.5 mg Last filled:03/26/22 90 ds, 10/08/21 90 DS (Patient states that she has some on hand did not want to count them) Donepezil 10 mg Last filled:05/15/22 30 ds, 03/23/22 30 DS Rosuvastatin 20 mg Last filled:05/14/22 90 ds, 09/30/21 90 DS Sacubitril-valsartan 49-51 mg Last filled:07/09/20 30 DS (Patient states that she has a few but not to last to end of year. States that she has application for next year and will wait until she gets  end of year tax return))   [place] [phone]

## 2022-06-29 ENCOUNTER — Ambulatory Visit: Payer: Medicare Other | Admitting: Family Medicine

## 2022-06-29 ENCOUNTER — Encounter (INDEPENDENT_AMBULATORY_CARE_PROVIDER_SITE_OTHER): Payer: Self-pay

## 2022-07-17 ENCOUNTER — Telehealth: Payer: Self-pay

## 2022-07-17 NOTE — Progress Notes (Signed)
Chronic Care Management Pharmacy Assistant   Name: Sarah White  MRN: [id number] DOB: 1945/12/25  Patient assistance application for Sarah White was completed on behalf of the patient.  Spoke with patient to make aware they will need to provide requested income verification to be sent the manufacturer.  Patient stated that Dr. Clayborn White the Cardiologist stated that Sarah White will be able to sin and date form. Advised the patient to check the mail after Thanksgiving for form. She states that she will send back after she gets her end of the year taxes.  Medications: Outpatient Encounter Medications as of 07/17/2022  Medication Sig Note   albuterol (ACCUNEB) 0.63 MG/3ML nebulizer solution Take 3 mLs (0.63 mg total) by nebulization every 6 (six) hours as needed for wheezing. Dx j44.9    albuterol (VENTOLIN HFA) 108 (90 Base) MCG/ACT inhaler Inhale 2 puffs into the lungs every 6 (six) hours as needed for wheezing or shortness of breath.    amiodarone (PACERONE) 200 MG tablet Take 0.5 tablets by mouth daily.    aspirin EC 81 MG tablet Take 81 mg by mouth at bedtime.     buPROPion (WELLBUTRIN XL) 300 MG 24 hr tablet Take 1 tablet (300 mg total) by mouth daily.    carvedilol (COREG) 12.5 MG tablet Take 12.5 mg by mouth daily.    clopidogrel (PLAVIX) 75 MG tablet Take 1 tablet (75 mg total) by mouth daily at 6 (six) AM.    cyclobenzaprine (FLEXERIL) 10 MG tablet Take 1 tablet by mouth three times daily as needed for muscle spasm    diphenhydrAMINE (BENADRYL) 25 MG tablet Take 25 mg by mouth at bedtime as needed for allergies. Dies not take regularly 04-06-20: Taking 1 QPM   donepezil (ARICEPT) 10 MG tablet TAKE 1 TABLET BY MOUTH NIGHTLY FOR 30 DAYS    furosemide (LASIX) 40 MG tablet Take 1 tablet (40 mg total) by mouth daily.    Glycopyrrolate-Formoterol (BEVESPI AEROSPHERE) 9-4.8 MCG/ACT AERO Inhale 2 puffs into the lungs 2 (two) times daily. 12/22/2021: Patient assistance   lidocaine (LIDODERM)  5 % Place 1 patch onto the skin daily. Remove & Discard patch within 12 hours or as directed by MD    nitroGLYCERIN (NITROSTAT) 0.4 MG SL tablet DISSOLVE ONE TABLET UNDER THE TONGUE EVERY 5 MINUTES AS NEEDED FOR CHEST PAIN.  DO NOT EXCEED A TOTAL OF 3 DOSES IN 15 MINUTES    ondansetron (ZOFRAN) 8 MG tablet Take 1 tablet (8 mg total) by mouth every 8 (eight) hours as needed for nausea or vomiting.    pantoprazole (PROTONIX) 40 MG tablet Take 1 tablet (40 mg total) by mouth 2 (two) times daily before a meal.    promethazine (PHENERGAN) 25 MG suppository Place 1 suppository (25 mg total) rectally every 6 (six) hours as needed for nausea or vomiting.    QUEtiapine (SEROQUEL XR) 50 MG TB24 24 hr tablet Take 2 tablets (100 mg total) by mouth at bedtime.    rosuvastatin (CRESTOR) 20 MG tablet Take 1 tablet (20 mg total) by mouth daily.    sacubitril-valsartan (ENTRESTO) 49-51 MG Take 1 tablet by mouth 2 (two) times daily.     sertraline (ZOLOFT) 100 MG tablet Take 2 tablets (200 mg total) by mouth daily.    traZODone (DESYREL) 50 MG tablet Take 1 tablet (50 mg total) by mouth at bedtime as needed. for sleep    triamcinolone ointment (KENALOG) 0.5 % Apply 1 application topically 2 (two) times daily.  No facility-administered encounter medications on file as of 07/17/2022.   Sarah White [phone]

## 2022-07-20 ENCOUNTER — Other Ambulatory Visit: Payer: Self-pay | Admitting: Family Medicine

## 2022-07-20 NOTE — Telephone Encounter (Signed)
Requested medication (s) are due for refill today - yes  Requested medication (s) are on the active medication list -yes  Future visit scheduled -no  Last refill: 03/19/22 #30 3RF  Notes to clinic: non delegated Rx  Requested Prescriptions  Pending Prescriptions Disp Refills   cyclobenzaprine (FLEXERIL) 10 MG tablet [Pharmacy Med Name: Cyclobenzaprine HCl 10 MG Oral Tablet] 30 tablet 0    Sig: Take 1 tablet by mouth three times daily as needed for muscle spasm     Not Delegated - Analgesics:  Muscle Relaxants Failed - 07/20/2022 10:38 AM      Failed - This refill cannot be delegated      Passed - Valid encounter within last 6 months    Recent Outpatient Visits           1 month ago Hypertensive renal disease   [facility] Johnson, [name], DO   3 months ago Type 2 diabetes mellitus with stage 3 chronic kidney disease, without long-term current use of insulin, unspecified whether stage 3a or 3b CKD ([place])   [place], [name], DO   7 months ago Orthostatic hypotension   [employer], [name], DO   9 months ago Hypotension due to drugs   [employer], [name], DO   12 months ago Depression, major, single episode, moderate ([place])   [place], [place], DO                 Requested Prescriptions  Pending Prescriptions Disp Refills   cyclobenzaprine (FLEXERIL) 10 MG tablet [Pharmacy Med Name: Cyclobenzaprine HCl 10 MG Oral Tablet] 30 tablet 0    Sig: Take 1 tablet by mouth three times daily as needed for muscle spasm     Not Delegated - Analgesics:  Muscle Relaxants Failed - 07/20/2022 10:38 AM      Failed - This refill cannot be delegated      Passed - Valid encounter within last 6 months    Recent Outpatient Visits           1 month ago Hypertensive renal disease   [facility] Johnson, [name], DO   3 months ago Type 2 diabetes mellitus with stage 3  chronic kidney disease, without long-term current use of insulin, unspecified whether stage 3a or 3b CKD ([place])   [place], [name], DO   7 months ago Orthostatic hypotension   [place], [name], DO   9 months ago Hypotension due to drugs   [place] P, DO   12 months ago Depression, major, single episode, moderate ([place])   [place], [place], DO

## 2022-08-17 ENCOUNTER — Telehealth: Payer: Self-pay | Admitting: Family Medicine

## 2022-08-18 NOTE — Telephone Encounter (Signed)
Requested medication (s) are due for refill today: yes  Requested medication (s) are on the active medication list: yes    Last refill: 07/20/22  #30  0 refills  Future visit scheduled no  Notes to clinic:Not delegated, please review. Thank you.  Requested Prescriptions  Pending Prescriptions Disp Refills   cyclobenzaprine (FLEXERIL) 10 MG tablet [Pharmacy Med Name: Cyclobenzaprine HCl 10 MG Oral Tablet] 30 tablet 0    Sig: Take 1 tablet by mouth three times daily as needed for muscle spasm     Not Delegated - Analgesics:  Muscle Relaxants Failed - 08/17/2022  8:24 AM      Failed - This refill cannot be delegated      Passed - Valid encounter within last 6 months    Recent Outpatient Visits           2 months ago Hypertensive renal disease   [facility] Johnson, [name], DO   4 months ago Type 2 diabetes mellitus with stage 3 chronic kidney disease, without long-term current use of insulin, unspecified whether stage 3a or 3b CKD ([place])   [place], [name], DO   8 months ago Orthostatic hypotension   [employer], [name], DO   9 months ago Hypotension due to drugs   [employer], [name], DO   1 year ago Depression, major, single episode, moderate ([place])   [place], [name], DO              Refused Prescriptions Disp Refills   traZODone (DESYREL) 50 MG tablet [Pharmacy Med Name: traZODone HCl 50 MG Oral Tablet] 90 tablet 0    Sig: TAKE 1 TABLET BY MOUTH AT BEDTIME AS NEEDED FOR SLEEP     Psychiatry: Antidepressants - Serotonin Modulator Passed - 08/17/2022  8:24 AM      Passed - Completed PHQ-2 or PHQ-9 in the last 360 days      Passed - Valid encounter within last 6 months    Recent Outpatient Visits           2 months ago Hypertensive renal disease   [facility] Johnson, [name], DO   4 months ago Type 2 diabetes mellitus with stage 3 chronic kidney  disease, without long-term current use of insulin, unspecified whether stage 3a or 3b CKD ([place])   [place], [name], DO   8 months ago Orthostatic hypotension   [employer], [name], DO   9 months ago Hypotension due to drugs   [place], DO   1 year ago Depression, major, single episode, moderate ([place])   [place], [place], DO

## 2022-08-18 NOTE — Telephone Encounter (Signed)
Requested Prescriptions  Pending Prescriptions Disp Refills   traZODone (DESYREL) 50 MG tablet [Pharmacy Med Name: traZODone HCl 50 MG Oral Tablet] 90 tablet 0    Sig: TAKE 1 TABLET BY MOUTH AT BEDTIME AS NEEDED FOR SLEEP     Psychiatry: Antidepressants - Serotonin Modulator Passed - 08/17/2022  8:24 AM      Passed - Completed PHQ-2 or PHQ-9 in the last 360 days      Passed - Valid encounter within last 6 months    Recent Outpatient Visits           2 months ago Hypertensive renal disease   [facility] Johnson, [name], DO   4 months ago Type 2 diabetes mellitus with stage 3 chronic kidney disease, without long-term current use of insulin, unspecified whether stage 3a or 3b CKD ([place])   [place], [name], DO   8 months ago Orthostatic hypotension   [employer], [name], DO   9 months ago Hypotension due to drugs   [employer], [name], DO   1 year ago Depression, major, single episode, moderate ([place])   [place], [name], DO               cyclobenzaprine (FLEXERIL) 10 MG tablet [Pharmacy Med Name: Cyclobenzaprine HCl 10 MG Oral Tablet] 30 tablet 0    Sig: Take 1 tablet by mouth three times daily as needed for muscle spasm     Not Delegated - Analgesics:  Muscle Relaxants Failed - 08/17/2022  8:24 AM      Failed - This refill cannot be delegated      Passed - Valid encounter within last 6 months    Recent Outpatient Visits           2 months ago Hypertensive renal disease   [facility] Johnson, [name], DO   4 months ago Type 2 diabetes mellitus with stage 3 chronic kidney disease, without long-term current use of insulin, unspecified whether stage 3a or 3b CKD ([place])   [place], [name], DO   8 months ago Orthostatic hypotension   [place] P, DO   9 months ago Hypotension due to drugs   [place], DO   1 year ago Depression, major, single episode, moderate ([place])   [place], [place], DO

## 2022-08-21 ENCOUNTER — Other Ambulatory Visit: Payer: Self-pay | Admitting: Family Medicine

## 2022-08-21 MED ORDER — CYCLOBENZAPRINE HCL 10 MG PO TABS: 10.0000 mg | ORAL_TABLET | Freq: Three times a day (TID) | ORAL | 0 refills | Status: DC | PRN

## 2022-08-21 MED ORDER — TRAZODONE HCL 50 MG PO TABS: 50.0000 mg | ORAL_TABLET | Freq: Every evening | ORAL | 1 refills | Status: DC | PRN

## 2022-08-21 NOTE — Addendum Note (Signed)
Addended by: Linus Orn A on: 08/21/2022 10:54 AM   Modules accepted: Orders

## 2022-08-21 NOTE — Telephone Encounter (Signed)
Pt. Asking for refill on Flexeril. Requested 08/17/22. Please advise pt.

## 2022-09-03 ENCOUNTER — Telehealth: Payer: Self-pay

## 2022-09-03 NOTE — Progress Notes (Cosign Needed)
Care Management & Coordination Services Pharmacy Team  Reason for Encounter: Hypertension  Contacted patient on 09/03/22 to discuss hypertension disease state.   Recent office visits:  05/28/22-Sarah White [place] DO (PCP) Seen for hypotension. Will stop imdur and continue other medicines and recheck 2 weeks. Flu vaccine given. Follow up in 2 weeks.  Recent consult visits:  None noted  Hospital visits:  None in previous 6 months  Medications: Outpatient Encounter Medications as of 09/03/2022  Medication Sig Note   albuterol (ACCUNEB) 0.63 MG/3ML nebulizer solution Take 3 mLs (0.63 mg total) by nebulization every 6 (six) hours as needed for wheezing. Dx j44.9    albuterol (VENTOLIN HFA) 108 (90 Base) MCG/ACT inhaler Inhale 2 puffs into the lungs every 6 (six) hours as needed for wheezing or shortness of breath.    amiodarone (PACERONE) 200 MG tablet Take 0.5 tablets by mouth daily.    aspirin EC 81 MG tablet Take 81 mg by mouth at bedtime.     buPROPion (WELLBUTRIN XL) 300 MG 24 hr tablet Take 1 tablet (300 mg total) by mouth daily.    carvedilol (COREG) 12.5 MG tablet Take 12.5 mg by mouth daily.    clopidogrel (PLAVIX) 75 MG tablet Take 1 tablet (75 mg total) by mouth daily at 6 (six) AM.    cyclobenzaprine (FLEXERIL) 10 MG tablet Take 1 tablet (10 mg total) by mouth 3 (three) times daily as needed. for muscle spams    diphenhydrAMINE (BENADRYL) 25 MG tablet Take 25 mg by mouth at bedtime as needed for allergies. Dies not take regularly 04/19/20: Taking 1 QPM   donepezil (ARICEPT) 10 MG tablet TAKE 1 TABLET BY MOUTH NIGHTLY FOR 30 DAYS    furosemide (LASIX) 40 MG tablet Take 1 tablet (40 mg total) by mouth daily.    Glycopyrrolate-Formoterol (BEVESPI AEROSPHERE) 9-4.8 MCG/ACT AERO Inhale 2 puffs into the lungs 2 (two) times daily. 12/22/2021: Patient assistance   lidocaine (LIDODERM) 5 % Place 1 patch onto the skin daily. Remove & Discard patch within 12 hours or as directed by MD     nitroGLYCERIN (NITROSTAT) 0.4 MG SL tablet DISSOLVE ONE TABLET UNDER THE TONGUE EVERY 5 MINUTES AS NEEDED FOR CHEST PAIN.  DO NOT EXCEED A TOTAL OF 3 DOSES IN 15 MINUTES    ondansetron (ZOFRAN) 8 MG tablet Take 1 tablet (8 mg total) by mouth every 8 (eight) hours as needed for nausea or vomiting.    pantoprazole (PROTONIX) 40 MG tablet Take 1 tablet (40 mg total) by mouth 2 (two) times daily before a meal.    promethazine (PHENERGAN) 25 MG suppository Place 1 suppository (25 mg total) rectally every 6 (six) hours as needed for nausea or vomiting.    QUEtiapine (SEROQUEL XR) 50 MG TB24 24 hr tablet Take 2 tablets (100 mg total) by mouth at bedtime.    rosuvastatin (CRESTOR) 20 MG tablet Take 1 tablet (20 mg total) by mouth daily.    sacubitril-valsartan (ENTRESTO) 49-51 MG Take 1 tablet by mouth 2 (two) times daily.     sertraline (ZOLOFT) 100 MG tablet Take 2 tablets (200 mg total) by mouth daily.    traZODone (DESYREL) 50 MG tablet Take 1 tablet (50 mg total) by mouth at bedtime as needed. for sleep    triamcinolone ointment (KENALOG) 0.5 % Apply 1 application topically 2 (two) times daily.    No facility-administered encounter medications on file as of 09/03/2022.    Recent Office Vitals: BP Readings from Last 3 Encounters:  05/28/22 (!) 144/78  03/27/22 103/69  11/25/21 123/75   Pulse Readings from Last 3 Encounters:  05/28/22 84  03/27/22 69  11/25/21 72    Wt Readings from Last 3 Encounters:  05/28/22 167 lb 6.4 oz (75.9 kg)  03/27/22 159 lb 9.6 oz (72.4 kg)  11/25/21 161 lb 3.2 oz (73.1 kg)     Kidney Function Lab Results  Component Value Date/Time   CREATININE 1.35 (H) 03/27/2022 01:33 PM   CREATININE 0.99 06/23/2021 03:18 PM   CREATININE 0.52 (L) 01/15/2014 03:56 PM   GFRNONAA 72 09/24/2020 02:02 PM   GFRNONAA >60 01/15/2014 03:56 PM   GFRAA 83 09/24/2020 02:02 PM   GFRAA >60 01/15/2014 03:56 PM       Latest Ref Rng & Units 03/27/2022    1:33 PM 06/23/2021    3:18  PM 09/24/2020    2:02 PM  BMP  Glucose 70 - 99 mg/dL 106  89  115   BUN 8 - 27 mg/dL '16  14  10   '$ Creatinine 0.57 - 1.00 mg/dL 1.35  0.99  0.82   BUN/Creat Ratio 12 - '28 12  14  12   '$ Sodium 134 - 144 mmol/L 140  140  146   Potassium 3.5 - 5.2 mmol/L 3.5  3.8  4.0   Chloride 96 - 106 mmol/L 100  99  106   CO2 20 - 29 mmol/L '24  24  21   '$ Calcium 8.7 - 10.3 mg/dL 8.9  9.3  9.2      Current antihypertensive regimen:  Carvedilol 12.5 mg take 1 tab daily  Patient verbally confirms she is taking the above medications as directed. Yes  How often are you checking your Blood Pressure? daily  she checks her blood pressure in the afternoon after taking her medication.  Current home BP readings:   DATE:             BP               PULSE 09/03/21                   128/78              N/a   Wrist or arm cuff:Arm Caffeine intake:Patient drinks a cup of coffee daily Salt intake:N/a OTC medications including pseudoephedrine or NSAIDs? N/a  Any readings above 180/120? No If yes any symptoms of hypertensive emergency? patient denies any symptoms of high blood pressure   What recent interventions/DTPs have been made by any provider to improve Blood Pressure control since last CPP Visit: None noted  Any recent hospitalizations or ED visits since last visit with CPP? No  What diet changes have been made to improve Blood Pressure Control?  Patient states she does not make any diet changes, she states "she eats whatever she wants."  What exercise is being done to improve your Blood Pressure Control?  Patient states she does not exercise because she does not have good balance she uses her walker   Adherence Review: Is the patient currently on ACE/ARB medication? Yes Does the patient have >5 day gap between last estimated fill dates? Yes  Star Rating Drugs:  Rosuvastatin 20 mg Last filled:05/14/22 90 DS, 09/30/21 90 DS Sacubitril-valsartan 49-51 mg Last filled:07/09/20 30 DS, 07/08/20 30  DS   Myriam Elta [place], RMA

## 2022-09-07 ENCOUNTER — Telehealth: Payer: Self-pay

## 2022-09-07 ENCOUNTER — Encounter: Payer: Self-pay | Admitting: Family Medicine

## 2022-09-07 ENCOUNTER — Ambulatory Visit (INDEPENDENT_AMBULATORY_CARE_PROVIDER_SITE_OTHER): Payer: Medicare Other | Admitting: Family Medicine

## 2022-09-07 VITALS — BP 115/67 | HR 74 | Temp 98.7°F | Ht 64.0 in | Wt 158.2 lb

## 2022-09-07 DIAGNOSIS — J449 Chronic obstructive pulmonary disease, unspecified: Secondary | ICD-10-CM | POA: Diagnosis not present

## 2022-09-07 DIAGNOSIS — Z87891 Personal history of nicotine dependence: Secondary | ICD-10-CM

## 2022-09-07 DIAGNOSIS — E1122 Type 2 diabetes mellitus with diabetic chronic kidney disease: Secondary | ICD-10-CM

## 2022-09-07 DIAGNOSIS — F321 Major depressive disorder, single episode, moderate: Secondary | ICD-10-CM

## 2022-09-07 DIAGNOSIS — F17200 Nicotine dependence, unspecified, uncomplicated: Secondary | ICD-10-CM | POA: Diagnosis not present

## 2022-09-07 DIAGNOSIS — I700 Atherosclerosis of aorta: Secondary | ICD-10-CM

## 2022-09-07 DIAGNOSIS — N183 Chronic kidney disease, stage 3 unspecified: Secondary | ICD-10-CM | POA: Diagnosis not present

## 2022-09-07 DIAGNOSIS — E782 Mixed hyperlipidemia: Secondary | ICD-10-CM

## 2022-09-07 DIAGNOSIS — Z720 Tobacco use: Secondary | ICD-10-CM | POA: Diagnosis not present

## 2022-09-07 LAB — BAYER DCA HB A1C WAIVED: HB A1C (BAYER DCA - WAIVED): 5.6 % (ref 4.8–5.6)

## 2022-09-07 NOTE — Progress Notes (Signed)
BP 115/67   Pulse 74   Temp 98.7 F (37.1 C) (Oral)   Ht '5\' 4"'$  (1.626 m)   Wt 158 lb 3.2 oz (71.8 kg)   SpO2 98%   BMI 27.15 kg/m    Subjective:    [name], female    DOB: 1946/03/06, 77 y.o.   MRN: [id number]  HPI: Sarah White is a 77 y.o. female  Chief Complaint  Patient presents with   Hypertension   Diabetes    Patient declines having a recent Diabetic Eye Exam.    DIABETES Hypoglycemic episodes:no Polydipsia/polyuria: no Visual disturbance: no Chest pain: no Paresthesias: no Glucose Monitoring: no  Accucheck frequency: Not Checking Taking Insulin?: no Blood Pressure Monitoring: not checking Retinal Examination: Not up to Date Foot Exam: Up to Date Diabetic Education: Completed Pneumovax: Up to Date Influenza: Up to Date Aspirin: yes  HYPERTENSION / HYPERLIPIDEMIA Satisfied with current treatment? yes Duration of hypertension: chronic BP monitoring frequency: not checking BP medication side effects: no Past BP meds: entresto, lasix, carvedilol Duration of hyperlipidemia: chronic Cholesterol medication side effects: no Cholesterol supplements: none Past cholesterol medications: crestor Medication compliance: excellent compliance Aspirin: yes Recent stressors: no Recurrent headaches: no Visual changes: no Palpitations: no Dyspnea: no Chest pain: no Lower extremity edema: no Dizzy/lightheaded: no  DEPRESSION Mood status: controlled Satisfied with current treatment?: yes Symptom severity: mild  Duration of current treatment : chronic Side effects: no Medication compliance: excellent compliance Psychotherapy/counseling: no  Previous psychiatric medications: seroquel Depressed mood: no Anxious mood: yes Anhedonia: no Significant weight loss or gain: no Insomnia: no  Fatigue: yes Feelings of worthlessness or guilt: no Impaired concentration/indecisiveness: no Suicidal ideations: no Hopelessness: no Crying spells:  no    05/28/2022    4:30 PM 05/18/2022   10:44 AM 03/27/2022    1:26 PM 11/25/2021    1:30 PM 10/23/2021    2:04 PM  Depression screen PHQ 2/9  Decreased Interest 0 2 1 0 1  Down, Depressed, Hopeless 1 0 1 0 1  PHQ - 2 Score '1 2 2 '$ 0 2  Altered sleeping 3 2 0 0 0  Tired, decreased energy 0 0 1 0 1  Change in appetite 0 0 0 0 0  Feeling bad or failure about yourself  0 '1 2 1 1  '$ Trouble concentrating 0 0 0 0 0  Moving slowly or fidgety/restless 0 0 0 0 0  Suicidal thoughts 0 0 0 0 0  PHQ-9 Score '4 5 5 1 4  '$ Difficult doing work/chores Very difficult Not difficult at all       Relevant past medical, surgical, family and social history reviewed and updated as indicated. Interim medical history since our last visit reviewed. Allergies and medications reviewed and updated.  Review of Systems  Constitutional: Negative.   Respiratory:  Positive for shortness of breath. Negative for apnea, cough, choking, chest tightness, wheezing and stridor.   Cardiovascular: Negative.   Gastrointestinal: Negative.   Genitourinary: Negative.   Musculoskeletal: Negative.   Neurological: Negative.   Psychiatric/Behavioral: Negative.      Per HPI unless specifically indicated above     Objective:    BP 115/67   Pulse 74   Temp 98.7 F (37.1 C) (Oral)   Ht '5\' 4"'$  (1.626 m)   Wt 158 lb 3.2 oz (71.8 kg)   SpO2 98%   BMI 27.15 kg/m   Wt Readings from Last 3 Encounters:  09/07/22 158 lb 3.2 oz (  71.8 kg)  05/28/22 167 lb 6.4 oz (75.9 kg)  03/27/22 159 lb 9.6 oz (72.4 kg)    Physical Exam Vitals and nursing note reviewed.  Constitutional:      General: She is not in acute distress.    Appearance: Normal appearance. She is not ill-appearing, toxic-appearing or diaphoretic.  HENT:     Head: Normocephalic and atraumatic.     Right Ear: External ear normal.     Left Ear: External ear normal.     Nose: Nose normal.     Mouth/Throat:     Mouth: Mucous membranes are moist.     Pharynx: Oropharynx  is clear.  Eyes:     General: No scleral icterus.       Right eye: No discharge.        Left eye: No discharge.     Extraocular Movements: Extraocular movements intact.     Conjunctiva/sclera: Conjunctivae normal.     Pupils: Pupils are equal, round, and reactive to light.  Cardiovascular:     Rate and Rhythm: Normal rate and regular rhythm.     Pulses: Normal pulses.     Heart sounds: Normal heart sounds. No murmur heard.    No friction rub. No gallop.  Pulmonary:     Effort: Pulmonary effort is normal. No respiratory distress.     Breath sounds: Normal breath sounds. No stridor. No wheezing, rhonchi or rales.  Chest:     Chest wall: No tenderness.  Musculoskeletal:        General: Normal range of motion.     Cervical back: Normal range of motion and neck supple.  Skin:    General: Skin is warm and dry.     Capillary Refill: Capillary refill takes less than 2 seconds.     Coloration: Skin is not jaundiced or pale.     Findings: No bruising, erythema, lesion or rash.  Neurological:     General: No focal deficit present.     Mental Status: She is alert and oriented to person, place, and time. Mental status is at baseline.  Psychiatric:        Mood and Affect: Mood normal.        Behavior: Behavior normal.        Thought Content: Thought content normal.        Judgment: Judgment normal.     Results for orders placed or performed in visit on 09/07/22  Comprehensive metabolic panel  Result Value Ref Range   Glucose 89 70 - 99 mg/dL   BUN 12 8 - 27 mg/dL   Creatinine, Ser 1.16 (H) 0.57 - 1.00 mg/dL   eGFR 49 (L) >59 mL/min/1.73   BUN/Creatinine Ratio 10 (L) 12 - 28   Sodium 138 134 - 144 mmol/L   Potassium 4.5 3.5 - 5.2 mmol/L   Chloride 101 96 - 106 mmol/L   CO2 22 20 - 29 mmol/L   Calcium 8.9 8.7 - 10.3 mg/dL   Total Protein 6.3 6.0 - 8.5 g/dL   Albumin 4.1 3.8 - 4.8 g/dL   Globulin, Total 2.2 1.5 - 4.5 g/dL   Albumin/Globulin Ratio 1.9 1.2 - 2.2   Bilirubin Total  0.4 0.0 - 1.2 mg/dL   Alkaline Phosphatase 121 44 - 121 IU/L   AST 15 0 - 40 IU/L   ALT 13 0 - 32 IU/L  CBC with Differential/Platelet  Result Value Ref Range   WBC 4.5 3.4 - 10.8 x10E3/uL   RBC 4.61  3.77 - 5.28 x10E6/uL   Hemoglobin 12.9 11.1 - 15.9 g/dL   Hematocrit 39.0 34.0 - 46.6 %   MCV 85 79 - 97 fL   MCH 28.0 26.6 - 33.0 pg   MCHC 33.1 31.5 - 35.7 g/dL   RDW 13.4 11.7 - 15.4 %   Platelets 135 (L) 150 - 450 x10E3/uL   Neutrophils 76 Not Estab. %   Lymphs 14 Not Estab. %   Monocytes 8 Not Estab. %   Eos 1 Not Estab. %   Basos 1 Not Estab. %   Neutrophils Absolute 3.4 1.4 - 7.0 x10E3/uL   Lymphocytes Absolute 0.6 (L) 0.7 - 3.1 x10E3/uL   Monocytes Absolute 0.4 0.1 - 0.9 x10E3/uL   EOS (ABSOLUTE) 0.0 0.0 - 0.4 x10E3/uL   Basophils Absolute 0.0 0.0 - 0.2 x10E3/uL   Immature Granulocytes 0 Not Estab. %   Immature Grans (Abs) 0.0 0.0 - 0.1 x10E3/uL  Bayer DCA Hb A1c Waived  Result Value Ref Range   HB A1C (BAYER DCA - WAIVED) 5.6 4.8 - 5.6 %  Lipid Panel w/o Chol/HDL Ratio  Result Value Ref Range   Cholesterol, Total 164 100 - 199 mg/dL   Triglycerides 158 (H) 0 - 149 mg/dL   HDL 44 >39 mg/dL   VLDL Cholesterol Cal 28 5 - 40 mg/dL   LDL Chol Calc (NIH) 92 0 - 99 mg/dL  TSH  Result Value Ref Range   TSH 1.740 0.450 - 4.500 uIU/mL      Assessment & Plan:   Problem List Items Addressed This Visit       Cardiovascular and Mediastinum   Aortic atherosclerosis (HCC)    Will keep BP and cholesterol under good control. Continue to monitor. Call with any concerns.       Relevant Medications   furosemide (LASIX) 40 MG tablet   rosuvastatin (CRESTOR) 20 MG tablet     Respiratory   COPD (chronic obstructive pulmonary disease) (HCC)    Under good control on current regimen. Continue current regimen. Continue to monitor. Call with any concerns. Refills given.        Relevant Medications   albuterol (VENTOLIN HFA) 108 (90 Base) MCG/ACT inhaler     Endocrine    Diabetes mellitus with chronic kidney disease ([place]) - Primary    Stable with A1c of 5.6. Continue current regimen. Call with any concerns. Continue to monitor.       Relevant Medications   rosuvastatin (CRESTOR) 20 MG tablet   Other Relevant Orders   Comprehensive metabolic panel (Completed)   CBC with Differential/Platelet (Completed)   Bayer DCA Hb A1c Waived (Completed)   Lipid Panel w/o Chol/HDL Ratio (Completed)   TSH (Completed)     Other   Smoker    Will check low dose CT. Await results.       Relevant Orders   CT CHEST LUNG CA SCREEN LOW DOSE W/O CM   Hyperlipidemia    Under good control on current regimen. Continue current regimen. Continue to monitor. Call with any concerns. Refills given. Labs drawn today.       Relevant Medications   furosemide (LASIX) 40 MG tablet   rosuvastatin (CRESTOR) 20 MG tablet   Depression, major, single episode, moderate (HCC)    Under good control on current regimen. Continue current regimen. Continue to monitor. Call with any concerns. Refills given.        Relevant Medications   buPROPion (WELLBUTRIN XL) 300 MG 24 hr tablet  sertraline (ZOLOFT) 100 MG tablet   traZODone (DESYREL) 50 MG tablet   Personal history of nicotine dependence    Will check low dose CT. Await results.       Relevant Orders   CT CHEST LUNG CA SCREEN LOW DOSE W/O CM   Other Visit Diagnoses     Tobacco abuse       Will check low dose CT. Await results.   Relevant Orders   CT CHEST LUNG CA SCREEN LOW DOSE W/O CM        Follow up plan: Return in about 3 months (around 12/07/2022).

## 2022-09-07 NOTE — Progress Notes (Cosign Needed)
Called patient to ask if she was taking Entresto and if so was she still needing patient assistance  from [employer] for this medication. Left message for patient to return call.  Ethelene Hal

## 2022-09-08 LAB — COMPREHENSIVE METABOLIC PANEL
ALT: 13 IU/L (ref 0–32)
AST: 15 IU/L (ref 0–40)
Albumin/Globulin Ratio: 1.9 (ref 1.2–2.2)
Albumin: 4.1 g/dL (ref 3.8–4.8)
Alkaline Phosphatase: 121 IU/L (ref 44–121)
BUN/Creatinine Ratio: 10 — ABNORMAL LOW (ref 12–28)
BUN: 12 mg/dL (ref 8–27)
Bilirubin Total: 0.4 mg/dL (ref 0.0–1.2)
CO2: 22 mmol/L (ref 20–29)
Calcium: 8.9 mg/dL (ref 8.7–10.3)
Chloride: 101 mmol/L (ref 96–106)
Creatinine, Ser: 1.16 mg/dL — ABNORMAL HIGH (ref 0.57–1.00)
Globulin, Total: 2.2 g/dL (ref 1.5–4.5)
Glucose: 89 mg/dL (ref 70–99)
Potassium: 4.5 mmol/L (ref 3.5–5.2)
Sodium: 138 mmol/L (ref 134–144)
Total Protein: 6.3 g/dL (ref 6.0–8.5)
eGFR: 49 mL/min/{1.73_m2} — ABNORMAL LOW (ref >59)

## 2022-09-08 LAB — CBC WITH DIFFERENTIAL/PLATELET
Basophils Absolute: 0 10*3/uL (ref 0.0–0.2)
Basos: 1 %
EOS (ABSOLUTE): 0 10*3/uL (ref 0.0–0.4)
Eos: 1 %
Hematocrit: 39 % (ref 34.0–46.6)
Hemoglobin: 12.9 g/dL (ref 11.1–15.9)
Immature Grans (Abs): 0 10*3/uL (ref 0.0–0.1)
Immature Granulocytes: 0 %
Lymphocytes Absolute: 0.6 10*3/uL — ABNORMAL LOW (ref 0.7–3.1)
Lymphs: 14 %
MCH: 28 pg (ref 26.6–33.0)
MCHC: 33.1 g/dL (ref 31.5–35.7)
MCV: 85 fL (ref 79–97)
Monocytes Absolute: 0.4 10*3/uL (ref 0.1–0.9)
Monocytes: 8 %
Neutrophils Absolute: 3.4 10*3/uL (ref 1.4–7.0)
Neutrophils: 76 %
Platelets: 135 10*3/uL — ABNORMAL LOW (ref 150–450)
RBC: 4.61 x10E6/uL (ref 3.77–5.28)
RDW: 13.4 % (ref 11.7–15.4)
WBC: 4.5 10*3/uL (ref 3.4–10.8)

## 2022-09-08 LAB — TSH: TSH: 1.74 u[IU]/mL (ref 0.450–4.500)

## 2022-09-08 LAB — LIPID PANEL W/O CHOL/HDL RATIO
Cholesterol, Total: 164 mg/dL (ref 100–199)
HDL: 44 mg/dL (ref >39)
LDL Chol Calc (NIH): 92 mg/dL (ref 0–99)
Triglycerides: 158 mg/dL — ABNORMAL HIGH (ref 0–149)
VLDL Cholesterol Cal: 28 mg/dL (ref 5–40)

## 2022-09-14 ENCOUNTER — Encounter: Payer: Self-pay | Admitting: Family Medicine

## 2022-09-14 DIAGNOSIS — Z87891 Personal history of nicotine dependence: Secondary | ICD-10-CM | POA: Insufficient documentation

## 2022-09-14 MED ORDER — ROSUVASTATIN CALCIUM 20 MG PO TABS: 20.0000 mg | ORAL_TABLET | Freq: Every day | ORAL | 1 refills | Status: DC

## 2022-09-14 MED ORDER — FUROSEMIDE 40 MG PO TABS: 40.0000 mg | ORAL_TABLET | Freq: Every day | ORAL | 1 refills | Status: DC

## 2022-09-14 MED ORDER — CLOPIDOGREL BISULFATE 75 MG PO TABS: 75.0000 mg | ORAL_TABLET | Freq: Every day | ORAL | 1 refills | Status: DC

## 2022-09-14 MED ORDER — PANTOPRAZOLE SODIUM 40 MG PO TBEC: 40.0000 mg | DELAYED_RELEASE_TABLET | Freq: Two times a day (BID) | ORAL | 3 refills | Status: DC

## 2022-09-14 MED ORDER — ALBUTEROL SULFATE HFA 108 (90 BASE) MCG/ACT IN AERS: 2.0000 | INHALATION_SPRAY | Freq: Four times a day (QID) | RESPIRATORY_TRACT | 6 refills | Status: DC | PRN

## 2022-09-14 MED ORDER — SERTRALINE HCL 100 MG PO TABS: 200.0000 mg | ORAL_TABLET | Freq: Every day | ORAL | 1 refills | Status: DC

## 2022-09-14 MED ORDER — TRAZODONE HCL 50 MG PO TABS: 50.0000 mg | ORAL_TABLET | Freq: Every evening | ORAL | 1 refills | Status: DC | PRN

## 2022-09-14 MED ORDER — BUPROPION HCL ER (XL) 300 MG PO TB24: 300.0000 mg | ORAL_TABLET | Freq: Every day | ORAL | 1 refills | Status: DC

## 2022-09-14 MED ORDER — QUETIAPINE FUMARATE ER 50 MG PO TB24: 100.0000 mg | ORAL_TABLET | Freq: Every day | ORAL | 1 refills | Status: DC

## 2022-09-14 NOTE — Assessment & Plan Note (Signed)
Under good control on current regimen. Continue current regimen. Continue to monitor. Call with any concerns. Refills given.   

## 2022-09-14 NOTE — Assessment & Plan Note (Signed)
Stable with A1c of 5.6. Continue current regimen. Call with any concerns. Continue to monitor.

## 2022-09-14 NOTE — Assessment & Plan Note (Signed)
Will check low dose CT. Await results.

## 2022-09-14 NOTE — Assessment & Plan Note (Signed)
Will keep BP and cholesterol under good control. Continue to monitor. Call with any concerns.

## 2022-09-14 NOTE — Assessment & Plan Note (Signed)
Under good control on current regimen. Continue current regimen. Continue to monitor. Call with any concerns. Refills given. Labs drawn today.

## 2022-09-14 NOTE — Assessment & Plan Note (Addendum)
Will check low dose CT. Await results.

## 2022-09-16 NOTE — Progress Notes (Cosign Needed)
Called patient this morning to discuss application for Entresto for 2024 enrollment year. Unable to reach patient,LVM to return call.  Ethelene Hal

## 2022-09-18 ENCOUNTER — Other Ambulatory Visit: Payer: Self-pay | Admitting: Family Medicine

## 2022-09-18 NOTE — Telephone Encounter (Signed)
Requested medication (s) are due for refill today - yes  Requested medication (s) are on the active medication list -yes  Future visit scheduled -no  Last refill: 08/21/22 #30  Notes to clinic: non delegated Rx  Requested Prescriptions  Pending Prescriptions Disp Refills   cyclobenzaprine (FLEXERIL) 10 MG tablet [Pharmacy Med Name: Cyclobenzaprine HCl 10 MG Oral Tablet] 30 tablet 0    Sig: Take 1 tablet by mouth three times daily as needed for muscle spasm     Not Delegated - Analgesics:  Muscle Relaxants Failed - 09/18/2022  3:03 AM      Failed - This refill cannot be delegated      Passed - Valid encounter within last 6 months    Recent Outpatient Visits           1 week ago Type 2 diabetes mellitus with stage 3 chronic kidney disease, without long-term current use of insulin, unspecified whether stage 3a or 3b CKD ([place])   [place], [name], DO   3 months ago Hypertensive renal disease   [place] [facility] [place], [name], DO   5 months ago Type 2 diabetes mellitus with stage 3 chronic kidney disease, without long-term current use of insulin, unspecified whether stage 3a or 3b CKD ([place])   [place], [name], DO   9 months ago Orthostatic hypotension   [place] [facility] [place], [name], DO   11 months ago Hypotension due to drugs   [place], [place] P, DO                 Requested Prescriptions  Pending Prescriptions Disp Refills   cyclobenzaprine (FLEXERIL) 10 MG tablet [Pharmacy Med Name: Cyclobenzaprine HCl 10 MG Oral Tablet] 30 tablet 0    Sig: Take 1 tablet by mouth three times daily as needed for muscle spasm     Not Delegated - Analgesics:  Muscle Relaxants Failed - 09/18/2022  3:03 AM      Failed - This refill cannot be delegated      Passed - Valid encounter within last 6 months    Recent Outpatient Visits            1 week ago Type 2 diabetes mellitus with stage 3 chronic kidney disease, without long-term current use of insulin, unspecified whether stage 3a or 3b CKD ([place])   [place], [name], DO   3 months ago Hypertensive renal disease   [place] [facility] [place], [name], DO   5 months ago Type 2 diabetes mellitus with stage 3 chronic kidney disease, without long-term current use of insulin, unspecified whether stage 3a or 3b CKD ([place])   [place], [name], DO   9 months ago Orthostatic hypotension   [place], [name], DO   11 months ago Hypotension due to drugs   [place], [name], DO

## 2022-09-23 NOTE — Progress Notes (Cosign Needed)
Patient assistance application for Sarah White and Sarah White has been completed on behalf of the patient.  Spoke with patient and she is aware she will need to provide requested income verification to be sent the manufacturer.  Applications will be mailed out to the patient on 10/02/22.  Ethelene Hal

## 2022-10-12 ENCOUNTER — Telehealth: Payer: Self-pay

## 2022-10-12 NOTE — Progress Notes (Cosign Needed)
Called patient and left message to return call about PAP for Entresto.  Sarah White

## 2022-10-15 NOTE — Progress Notes (Cosign Needed)
Called patient and left message to return call, in reference to [facility] application.  Sarah White

## 2022-10-20 ENCOUNTER — Other Ambulatory Visit: Payer: Self-pay | Admitting: Family Medicine

## 2022-10-20 ENCOUNTER — Telehealth: Payer: Self-pay | Admitting: Family Medicine

## 2022-10-20 NOTE — Telephone Encounter (Signed)
Pt dropped off AZ&ME application for Free [employer] to be completed by the provider.  Placed in providers folder for completion.

## 2022-10-21 NOTE — Telephone Encounter (Signed)
Requested medication (s) are due for refill today - yes  Requested medication (s) are on the active medication list -yes  Future visit scheduled -no  Last refill: 09/18/22 #30  Notes to clinic: non delegated Rx  Requested Prescriptions  Pending Prescriptions Disp Refills   cyclobenzaprine (FLEXERIL) 10 MG tablet [Pharmacy Med Name: Cyclobenzaprine HCl 10 MG Oral Tablet] 30 tablet 0    Sig: Take 1 tablet by mouth three times daily as needed for muscle spasm     Not Delegated - Analgesics:  Muscle Relaxants Failed - 10/20/2022  4:19 AM      Failed - This refill cannot be delegated      Passed - Valid encounter within last 6 months    Recent Outpatient Visits           1 month ago Type 2 diabetes mellitus with stage 3 chronic kidney disease, without long-term current use of insulin, unspecified whether stage 3a or 3b CKD ([place])   [place], [name], DO   4 months ago Hypertensive renal disease   [place] [facility] [place], [name], DO   6 months ago Type 2 diabetes mellitus with stage 3 chronic kidney disease, without long-term current use of insulin, unspecified whether stage 3a or 3b CKD ([place])   [place], [name], DO   11 months ago Orthostatic hypotension   [place] [facility] [place], [name], DO   12 months ago Hypotension due to drugs   [place], [place] P, DO                 Requested Prescriptions  Pending Prescriptions Disp Refills   cyclobenzaprine (FLEXERIL) 10 MG tablet [Pharmacy Med Name: Cyclobenzaprine HCl 10 MG Oral Tablet] 30 tablet 0    Sig: Take 1 tablet by mouth three times daily as needed for muscle spasm     Not Delegated - Analgesics:  Muscle Relaxants Failed - 10/20/2022  4:19 AM      Failed - This refill cannot be delegated      Passed - Valid encounter within last 6 months    Recent Outpatient Visits            1 month ago Type 2 diabetes mellitus with stage 3 chronic kidney disease, without long-term current use of insulin, unspecified whether stage 3a or 3b CKD ([place])   [place], [name], DO   4 months ago Hypertensive renal disease   [place] [facility] [place], [name], DO   6 months ago Type 2 diabetes mellitus with stage 3 chronic kidney disease, without long-term current use of insulin, unspecified whether stage 3a or 3b CKD [facility])   [place], [name], DO   11 months ago Orthostatic hypotension   [place], [name], DO   12 months ago Hypotension due to drugs   [place], [name], DO

## 2022-10-30 ENCOUNTER — Telehealth: Payer: Self-pay

## 2022-10-30 NOTE — Progress Notes (Incomplete)
Care Management & Coordination Services Pharmacy Team  Reason for Encounter: Hypertension  Contacted patient to discuss hypertension disease state. {US HC Outreach:28874}  Recent office visits:  09/07/22-Megan Annia Friendly, DO (PCP) Seen for hypertension and diabetes. Labs ordered. CT ordered of chest. Follow up in 3 months. 05/28/22-Megan Annia Friendly, DO (PCP) Seen for hypotension. Flu vaccine given. Follow up in 2 weeks.  Recent consult visits:  None noted  Hospital visits:  None in previous 6 months  Medications: Outpatient Encounter Medications as of 10/30/2022  Medication Sig Note   albuterol (ACCUNEB) 0.63 MG/3ML nebulizer solution Take 3 mLs (0.63 mg total) by nebulization every 6 (six) hours as needed for wheezing. Dx j44.9    albuterol (VENTOLIN HFA) 108 (90 Base) MCG/ACT inhaler Inhale 2 puffs into the lungs every 6 (six) hours as needed for wheezing or shortness of breath.    amiodarone (PACERONE) 200 MG tablet Take 0.5 tablets by mouth daily.    aspirin EC 81 MG tablet Take 81 mg by mouth at bedtime.     buPROPion (WELLBUTRIN XL) 300 MG 24 hr tablet Take 1 tablet (300 mg total) by mouth daily.    carvedilol (COREG) 12.5 MG tablet Take 12.5 mg by mouth daily.    clopidogrel (PLAVIX) 75 MG tablet Take 1 tablet (75 mg total) by mouth daily at 6 (six) AM.    cyclobenzaprine (FLEXERIL) 10 MG tablet Take 1 tablet by mouth three times daily as needed for muscle spasm    diphenhydrAMINE (BENADRYL) 25 MG tablet Take 25 mg by mouth at bedtime as needed for allergies. Dies not take regularly 03-27-20: Taking 1 QPM   donepezil (ARICEPT) 10 MG tablet TAKE 1 TABLET BY MOUTH NIGHTLY FOR 30 DAYS    furosemide (LASIX) 40 MG tablet Take 1 tablet (40 mg total) by mouth daily.    Glycopyrrolate-Formoterol (BEVESPI AEROSPHERE) 9-4.8 MCG/ACT AERO Inhale 2 puffs into the lungs 2 (two) times daily. 12/22/2021: Patient assistance   lidocaine (LIDODERM) 5 % Place 1 patch onto the skin daily. Remove &  Discard patch within 12 hours or as directed by MD    nitroGLYCERIN (NITROSTAT) 0.4 MG SL tablet DISSOLVE ONE TABLET UNDER THE TONGUE EVERY 5 MINUTES AS NEEDED FOR CHEST PAIN.  DO NOT EXCEED A TOTAL OF 3 DOSES IN 15 MINUTES    ondansetron (ZOFRAN) 8 MG tablet Take 1 tablet (8 mg total) by mouth every 8 (eight) hours as needed for nausea or vomiting.    pantoprazole (PROTONIX) 40 MG tablet Take 1 tablet (40 mg total) by mouth 2 (two) times daily before a meal.    promethazine (PHENERGAN) 25 MG suppository Place 1 suppository (25 mg total) rectally every 6 (six) hours as needed for nausea or vomiting.    QUEtiapine (SEROQUEL XR) 50 MG TB24 24 hr tablet Take 2 tablets (100 mg total) by mouth at bedtime.    rosuvastatin (CRESTOR) 20 MG tablet Take 1 tablet (20 mg total) by mouth daily.    sacubitril-valsartan (ENTRESTO) 49-51 MG Take 1 tablet by mouth 2 (two) times daily.     sertraline (ZOLOFT) 100 MG tablet Take 2 tablets (200 mg total) by mouth daily.    traZODone (DESYREL) 50 MG tablet Take 1 tablet (50 mg total) by mouth at bedtime as needed. for sleep    triamcinolone ointment (KENALOG) 0.5 % Apply 1 application topically 2 (two) times daily.    No facility-administered encounter medications on file as of 10/30/2022.    Recent Office Vitals:  BP Readings from Last 3 Encounters:  09/07/22 115/67  05/28/22 (!) 144/78  03/27/22 103/69   Pulse Readings from Last 3 Encounters:  09/07/22 74  05/28/22 84  03/27/22 69    Wt Readings from Last 3 Encounters:  09/07/22 158 lb 3.2 oz (71.8 kg)  05/28/22 167 lb 6.4 oz (75.9 kg)  03/27/22 159 lb 9.6 oz (72.4 kg)     Kidney Function Lab Results  Component Value Date/Time   CREATININE 1.16 (H) 09/07/2022 04:16 PM   CREATININE 1.35 (H) 03/27/2022 01:33 PM   CREATININE 0.52 (L) 01/15/2014 03:56 PM   GFRNONAA 72 09/24/2020 02:02 PM   GFRNONAA >60 01/15/2014 03:56 PM   GFRAA 83 09/24/2020 02:02 PM   GFRAA >60 01/15/2014 03:56 PM       Latest  Ref Rng & Units 09/07/2022    4:16 PM 03/27/2022    1:33 PM 06/23/2021    3:18 PM  BMP  Glucose 70 - 99 mg/dL 89  106  89   BUN 8 - 27 mg/dL 12  16  14    Creatinine 0.57 - 1.00 mg/dL 1.16  1.35  0.99   BUN/Creat Ratio 12 - 28 10  12  14    Sodium 134 - 144 mmol/L 138  140  140   Potassium 3.5 - 5.2 mmol/L 4.5  3.5  3.8   Chloride 96 - 106 mmol/L 101  100  99   CO2 20 - 29 mmol/L 22  24  24    Calcium 8.7 - 10.3 mg/dL 8.9  8.9  9.3   Current antihypertensive regimen:  Carvedilol 12.5 mg take 1 tab daily Sacbitril-valsartan 49-51 mg take 1 tablet twice daily  Patient verbally confirms she is taking the above medications as directed. {yes/no:20286}  How often are you checking your Blood Pressure? {CHL HP BP Monitoring Frequency:[phone]}  She checks her blood pressure {timing:25218} {before/after:25217} taking her medication.  Current home BP readings:  Wrist or arm cuff: Caffeine intake: Salt intake: OTC medications including pseudoephedrine or NSAIDs?  Any readings above 180/100? {yes/no:20286} If yes any symptoms of hypertensive emergency? {hypertensive emergency symptoms:25354}  What recent interventions/DTPs have been made by any provider to improve Blood Pressure control since last CPP Visit: None noted  Any recent hospitalizations or ED visits since last visit with CPP? No  What diet changes have been made to improve Blood Pressure Control?    What exercise is being done to improve your Blood Pressure Control?    Adherence Review: Is the patient currently on ACE/ARB medication? No Does the patient have >5 day gap between last estimated fill dates? No  Star Rating Drugs:  Rosuvastatin 20 mg Last filled:09/14/22 90 DS   Myriam Elta [place], RMA

## 2022-11-09 ENCOUNTER — Telehealth: Payer: Self-pay

## 2022-11-09 ENCOUNTER — Encounter: Payer: Self-pay | Admitting: Family Medicine

## 2022-11-09 MED ORDER — NYSTATIN 100000 UNIT/ML MT SUSP: 5.0000 mL | Freq: Four times a day (QID) | OROMUCOSAL | 0 refills | Status: DC

## 2022-11-09 NOTE — Telephone Encounter (Signed)
Copied from [place] [id number]. Topic: General - Other >> Nov 09, 2022  3:14 PM Sarah White wrote: Reason for CRM: The patient has been directed by their pharmacy to contact their PCP and request an alternate prescription for their nystatin (MYCOSTATIN) 100000 UNIT/ML suspension [id number] cannot be filled and is unavailable in the surrounding area   The patient was directed to request the medication in pill form   Please contact the patient further when possible

## 2022-11-12 MED ORDER — FLUCONAZOLE 100 MG PO TABS: 100.0000 mg | ORAL_TABLET | Freq: Every day | ORAL | 0 refills | Status: DC

## 2022-11-17 NOTE — Telephone Encounter (Signed)
Pt was called to let her know that on her patient assistance form it had the incorrect number for Dr. Wynetta Emery on it and it needs to be corrected so that the provider could sign off on it.  Pt stated that she will get in touch with the person that gave her the form and will get it redone.

## 2022-11-19 ENCOUNTER — Telehealth: Payer: Self-pay

## 2022-11-19 NOTE — Progress Notes (Signed)
Spoke with the patient today to discuss her patient assistance application for Entresto. Patient stated that she was told there was some incorrect information on her application. I was not sure what application she was referring to but found it to be the [facility]. The application was not completed, that I could find, and told the patient that I would pre fill another application for Entresto.   I saw that the last time application was fill was from Dr. Clayborn Bigness the cardiologist. She stated that he had told her it was ok if Dr. Wynetta Emery filled out the prescription. I made the patient aware that she can take the prefilled application to [facility] and ask if Dr. Wynetta Emery is willing to sign the application but if not she may have to take application to Dr. Clayborn Bigness office.   Ethelene Hal

## 2022-11-22 ENCOUNTER — Other Ambulatory Visit: Payer: Self-pay | Admitting: Family Medicine

## 2022-11-23 NOTE — Telephone Encounter (Signed)
Requested medications are due for refill today.  Provider to decide  Requested medications are on the active medications list.  yes  Last refill. 10/21/2022 #30 0 rf  Future visit scheduled.   no  Notes to clinic.  Refill not delegated.    Requested Prescriptions  Pending Prescriptions Disp Refills   cyclobenzaprine (FLEXERIL) 10 MG tablet [Pharmacy Med Name: Cyclobenzaprine HCl 10 MG Oral Tablet] 30 tablet 0    Sig: Take 1 tablet by mouth three times daily as needed for muscle spasm     Not Delegated - Analgesics:  Muscle Relaxants Failed - 11/22/2022  2:30 PM      Failed - This refill cannot be delegated      Passed - Valid encounter within last 6 months    Recent Outpatient Visits           2 months ago Type 2 diabetes mellitus with stage 3 chronic kidney disease, without long-term current use of insulin, unspecified whether stage 3a or 3b CKD ([place])   [place], [name], DO   5 months ago Hypertensive renal disease   [place] [facility] [place], [name], DO   8 months ago Type 2 diabetes mellitus with stage 3 chronic kidney disease, without long-term current use of insulin, unspecified whether stage 3a or 3b CKD [facility])   [place], [name], DO   12 months ago Orthostatic hypotension   [place] P, DO   1 year ago Hypotension due to drugs   [place], [name], DO

## 2022-12-19 ENCOUNTER — Other Ambulatory Visit: Payer: Self-pay | Admitting: Family Medicine

## 2022-12-21 NOTE — Telephone Encounter (Signed)
Requested medication (s) are due for refill today: Yes  Requested medication (s) are on the active medication list: Yes  Last refill:  11/24/22  Future visit scheduled: No  Notes to clinic:  See request.    Requested Prescriptions  Pending Prescriptions Disp Refills   cyclobenzaprine (FLEXERIL) 10 MG tablet [Pharmacy Med Name: Cyclobenzaprine HCl 10 MG Oral Tablet] 30 tablet 0    Sig: Take 1 tablet by mouth three times daily as needed for muscle spasm     Not Delegated - Analgesics:  Muscle Relaxants Failed - 12/19/2022  5:50 AM      Failed - This refill cannot be delegated      Passed - Valid encounter within last 6 months    Recent Outpatient Visits           3 months ago Type 2 diabetes mellitus with stage 3 chronic kidney disease, without long-term current use of insulin, unspecified whether stage 3a or 3b CKD (HCC)   [place] [facility] [place], [name], DO   6 months ago Hypertensive renal disease   [place] [facility] [place], [name], DO   8 months ago Type 2 diabetes mellitus with stage 3 chronic kidney disease, without long-term current use of insulin, unspecified whether stage 3a or 3b CKD (HCC)   [place] [facility] [place], [name], DO   1 year ago Orthostatic hypotension   [place] [facility] [place], [name], DO   1 year ago Hypotension due to drugs   [place] [facility] [place], [name], DO

## 2022-12-30 ENCOUNTER — Telehealth: Payer: Self-pay | Admitting: Family Medicine

## 2022-12-30 LAB — HM DIABETES EYE EXAM

## 2022-12-30 NOTE — Telephone Encounter (Signed)
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  Placed in provider's folder.

## 2023-01-07 ENCOUNTER — Encounter: Payer: Self-pay | Admitting: Family Medicine

## 2023-01-20 ENCOUNTER — Other Ambulatory Visit: Payer: Self-pay | Admitting: Family Medicine

## 2023-01-20 ENCOUNTER — Telehealth: Payer: Self-pay

## 2023-01-20 NOTE — Progress Notes (Signed)
Called patient to schedule appointment with CPP, appointment was made for 03/08/23 at 2:30 pm. Unable to reach patient, left message to return call.  Velvet Bathe

## 2023-01-21 NOTE — Telephone Encounter (Signed)
Requested medication (s) are due for refill today:   Provider to review  Requested medication (s) are on the active medication list:   Yes  Future visit scheduled:   No   Seen 4 mo. Ago by Dr. Laural Benes   Last ordered: 12/21/2022 #30, 0 refills  Non delegated refill    Requested Prescriptions  Pending Prescriptions Disp Refills   cyclobenzaprine (FLEXERIL) 10 MG tablet [Pharmacy Med Name: Cyclobenzaprine HCl 10 MG Oral Tablet] 30 tablet 0    Sig: Take 1 tablet by mouth three times daily as needed for muscle spasm     Not Delegated - Analgesics:  Muscle Relaxants Failed - 01/20/2023 10:14 PM      Failed - This refill cannot be delegated      Passed - Valid encounter within last 6 months    Recent Outpatient Visits           4 months ago Type 2 diabetes mellitus with stage 3 chronic kidney disease, without long-term current use of insulin, unspecified whether stage 3a or 3b CKD (HCC)   [place] [facility] [place], [name], DO   7 months ago Hypertensive renal disease   [place] [facility] [place], [name], DO   10 months ago Type 2 diabetes mellitus with stage 3 chronic kidney disease, without long-term current use of insulin, unspecified whether stage 3a or 3b CKD (HCC)    [facility] [place], [place], DO   1 year ago Orthostatic hypotension    [facility] [place], [name], DO   1 year ago Hypotension due to drugs    [facility] [place], [name], DO

## 2023-01-22 NOTE — Progress Notes (Signed)
Spoke with patient 01/21/23 and scheduled appointment with CPP for July. Also advised patient to call [employer] to give household size to representative so that her application for Sherryll Burger can be processed.  Sarah White

## 2023-01-31 ENCOUNTER — Other Ambulatory Visit: Payer: Self-pay | Admitting: Family Medicine

## 2023-02-01 NOTE — Telephone Encounter (Signed)
Requested medication (s) are due for refill today: yes  Requested medication (s) are on the active medication list: yes    Last refill: 12/21/22  #30  0 refills  Future visit scheduled   no  Notes to clinic:  not delegated, please review. Thank you.  Requested Prescriptions  Pending Prescriptions Disp Refills   cyclobenzaprine (FLEXERIL) 10 MG tablet [Pharmacy Med Name: Cyclobenzaprine HCl 10 MG Oral Tablet] 30 tablet 0    Sig: Take 1 tablet by mouth three times daily as needed for muscle spasm     Not Delegated - Analgesics:  Muscle Relaxants Failed - 01/31/2023 10:27 PM      Failed - This refill cannot be delegated      Passed - Valid encounter within last 6 months    Recent Outpatient Visits           4 months ago Type 2 diabetes mellitus with stage 3 chronic kidney disease, without long-term current use of insulin, unspecified whether stage 3a or 3b CKD (HCC)   [place] [facility] [place], [name], DO   8 months ago Hypertensive renal disease   [place] [facility] [place], [name], DO   10 months ago Type 2 diabetes mellitus with stage 3 chronic kidney disease, without long-term current use of insulin, unspecified whether stage 3a or 3b CKD (HCC)   [place] [facility] [place], [name], DO   1 year ago Orthostatic hypotension   [place] [facility] [place], [name], DO   1 year ago Hypotension due to drugs   [place] [facility] [place], [name], DO

## 2023-02-24 ENCOUNTER — Encounter: Payer: Self-pay | Admitting: Pharmacist

## 2023-02-24 NOTE — Progress Notes (Signed)
Patient previously followed by [facility] pharmacist. Per clinical review, no pharmacist appointment needed at this time. Will notify patient advocate team of [employer] application for medication assistance. Care guide directed to contact patient and cancel appointment and notify pharmacy team of any patient concerns.

## 2023-03-09 ENCOUNTER — Other Ambulatory Visit: Payer: Self-pay | Admitting: Family Medicine

## 2023-03-10 NOTE — Telephone Encounter (Signed)
Requested medication (s) are due for refill today: yes  Requested medication (s) are on the active medication list: yes    Last refill: 02/02/23  #30  0 refills  Future visit scheduled no  Notes to clinic: Not delegated, please review. Thank you.  Requested Prescriptions  Pending Prescriptions Disp Refills   cyclobenzaprine (FLEXERIL) 10 MG tablet [Pharmacy Med Name: Cyclobenzaprine HCl 10 MG Oral Tablet] 30 tablet 0    Sig: Take 1 tablet by mouth three times daily as needed for muscle spasm     Not Delegated - Analgesics:  Muscle Relaxants Failed - 03/09/2023  7:46 PM      Failed - This refill cannot be delegated      Failed - Valid encounter within last 6 months    Recent Outpatient Visits           6 months ago Type 2 diabetes mellitus with stage 3 chronic kidney disease, without long-term current use of insulin, unspecified whether stage 3a or 3b CKD (HCC)   [place] [facility] [place], [name], DO   9 months ago Hypertensive renal disease   [place] [facility] [place], [name], DO   11 months ago Type 2 diabetes mellitus with stage 3 chronic kidney disease, without long-term current use of insulin, unspecified whether stage 3a or 3b CKD (HCC)   [place] [facility] [place], [name], DO   1 year ago Orthostatic hypotension   [place] [facility] [place], [name], DO   1 year ago Hypotension due to drugs   [place] [facility] [place], [name], DO

## 2023-03-16 ENCOUNTER — Other Ambulatory Visit: Payer: Self-pay | Admitting: Family Medicine

## 2023-03-17 NOTE — Telephone Encounter (Signed)
Requested medication (s) are due for refill today - yes  Requested medication (s) are on the active medication list -yes  Future visit scheduled -no  Last refill: 02/02/23 #30  Notes to clinic: non delegated Rx- courtesy RF has been given by office, attempted to call patient to schedule appointment- left message to call office  Requested Prescriptions  Pending Prescriptions Disp Refills   cyclobenzaprine (FLEXERIL) 10 MG tablet [Pharmacy Med Name: Cyclobenzaprine HCl 10 MG Oral Tablet] 30 tablet 0    Sig: Take 1 tablet by mouth three times daily as needed for muscle spasm     Not Delegated - Analgesics:  Muscle Relaxants Failed - 03/16/2023  5:47 PM      Failed - This refill cannot be delegated      Failed - Valid encounter within last 6 months    Recent Outpatient Visits           6 months ago Type 2 diabetes mellitus with stage 3 chronic kidney disease, without long-term current use of insulin, unspecified whether stage 3a or 3b CKD (HCC)   [place] [facility] [place], [name], DO   9 months ago Hypertensive renal disease   [place] [facility] [place], [name], DO   11 months ago Type 2 diabetes mellitus with stage 3 chronic kidney disease, without long-term current use of insulin, unspecified whether stage 3a or 3b CKD (HCC)   [place] [facility] [place], [name], DO   1 year ago Orthostatic hypotension   [place] [facility] [place], [name], DO   1 year ago Hypotension due to drugs   [place] [facility] [place], [name], DO                 Requested Prescriptions  Pending Prescriptions Disp Refills   cyclobenzaprine (FLEXERIL) 10 MG tablet [Pharmacy Med Name: Cyclobenzaprine HCl 10 MG Oral Tablet] 30 tablet 0    Sig: Take 1 tablet by mouth three times daily as needed for muscle spasm     Not Delegated - Analgesics:  Muscle Relaxants Failed - 03/16/2023  5:47 PM      Failed - This  refill cannot be delegated      Failed - Valid encounter within last 6 months    Recent Outpatient Visits           6 months ago Type 2 diabetes mellitus with stage 3 chronic kidney disease, without long-term current use of insulin, unspecified whether stage 3a or 3b CKD (HCC)   [place] [facility] [place], [name], DO   9 months ago Hypertensive renal disease   [place] [facility] [place], [name], DO   11 months ago Type 2 diabetes mellitus with stage 3 chronic kidney disease, without long-term current use of insulin, unspecified whether stage 3a or 3b CKD (HCC)   [place] [facility] [place], [name], DO   1 year ago Orthostatic hypotension   [place] [facility] [place], [name], DO   1 year ago Hypotension due to drugs   [place] [facility] [place], [name], DO

## 2023-03-23 ENCOUNTER — Other Ambulatory Visit: Payer: Self-pay | Admitting: Family Medicine

## 2023-03-24 NOTE — Telephone Encounter (Signed)
Attempted to reach patient, LVM explaining that in order to get this prescription refilled she would need an appointment as she has not seen Dr. Laural Benes since January.  Put in CRM.

## 2023-04-15 ENCOUNTER — Other Ambulatory Visit: Payer: Self-pay | Admitting: Family Medicine

## 2023-04-16 NOTE — Telephone Encounter (Signed)
Requested medication (s) are due for refill today -yes  Requested medication (s) are on the active medication list -yes  Future visit scheduled -no  Last refill: 09/14/22 #180 1RF  Notes to clinic: non delegated Rx  Requested Prescriptions  Pending Prescriptions Disp Refills   QUEtiapine (SEROQUEL XR) 50 MG TB24 24 hr tablet [Pharmacy Med Name: QUEtiapine Fumarate ER 50 MG Oral Tablet Extended Release 24 Hour] 180 tablet 0    Sig: TAKE 2 TABLETS BY MOUTH AT BEDTIME     Not Delegated - Psychiatry:  Antipsychotics - Second Generation (Atypical) - quetiapine Failed - 04/15/2023  1:48 PM      Failed - This refill cannot be delegated      Failed - Valid encounter within last 6 months    Recent Outpatient Visits           7 months ago Type 2 diabetes mellitus with stage 3 chronic kidney disease, without long-term current use of insulin, unspecified whether stage 3a or 3b CKD (HCC)   [place] [facility] [place], [name], DO   10 months ago Hypertensive renal disease   [place] [facility] [place], [name], DO   1 year ago Type 2 diabetes mellitus with stage 3 chronic kidney disease, without long-term current use of insulin, unspecified whether stage 3a or 3b CKD (HCC)   [place] [facility] [place], [name], DO   1 year ago Orthostatic hypotension   [place] [facility] [place], [place] P, DO   1 year ago Hypotension due to drugs    [facility] [place], [place] P, DO              Failed - Lipid Panel in normal range within the last 12 months    Cholesterol, Total  Date Value Ref Range Status  09/07/2022 164 100 - 199 mg/dL Final   Cholesterol  Date Value Ref Range Status  01/16/2014 258 (H) 0 - 200 mg/dL Final   Cholesterol Piccolo, Waived  Date Value Ref Range Status  02/04/2016 164 <200 mg/dL Final    Comment:                            Desirable                <200                          Borderline High      200- 239                         High                     >239    Ldl Cholesterol, Calc  Date Value Ref Range Status  01/16/2014 156 (H) 0 - 100 mg/dL Final   LDL Chol Calc (NIH)  Date Value Ref Range Status  09/07/2022 92 0 - 99 mg/dL Final   HDL Cholesterol  Date Value Ref Range Status  01/16/2014 35 (L) 40 - 60 mg/dL Final   HDL  Date Value Ref Range Status  09/07/2022 44 >39 mg/dL Final   Triglycerides  Date Value Ref Range Status  09/07/2022 158 (H) 0 - 149 mg/dL Final  40/98/1191 478 (H) 0 - 200 mg/dL Final   Triglycerides Piccolo,Waived  Date Value Ref  Range Status  02/04/2016 258 (H) <150 mg/dL Final    Comment:                            Normal                   <150                         Borderline High     150 - 199                         High                200 - 499                         Very High                >499          Passed - TSH in normal range and within 360 days    TSH  Date Value Ref Range Status  09/07/2022 1.740 0.450 - 4.500 uIU/mL Final         Passed - Completed PHQ-2 or PHQ-9 in the last 360 days      Passed - Last BP in normal range    BP Readings from Last 1 Encounters:  09/07/22 115/67         Passed - Last Heart Rate in normal range    Pulse Readings from Last 1 Encounters:  09/07/22 74         Passed - CBC within normal limits and completed in the last 12 months    WBC  Date Value Ref Range Status  09/07/2022 4.5 3.4 - 10.8 x10E3/uL Final  03/08/2020 4.6 4.0 - 10.5 K/uL Final   RBC  Date Value Ref Range Status  09/07/2022 4.61 3.77 - 5.28 x10E6/uL Final  03/08/2020 4.04 3.87 - 5.11 MIL/uL Final   Hemoglobin  Date Value Ref Range Status  09/07/2022 12.9 11.1 - 15.9 g/dL Final   Hematocrit  Date Value Ref Range Status  09/07/2022 39.0 34.0 - 46.6 % Final   MCHC  Date Value Ref Range Status  09/07/2022 33.1 31.5 - 35.7 g/dL Final  54/05/8118 14.7 30.0 - 36.0 g/dL Final   [facility]   Date Value Ref Range Status  09/07/2022 28.0 26.6 - 33.0 pg Final  03/08/2020 27.5 26.0 - 34.0 pg Final   MCV  Date Value Ref Range Status  09/07/2022 85 79 - 97 fL Final  01/16/2014 84 80 - 100 fL Final   No results found for: "PLTCOUNTKUC", "LABPLAT", "POCPLA" RDW  Date Value Ref Range Status  09/07/2022 13.4 11.7 - 15.4 % Final  01/16/2014 14.1 11.5 - 14.5 % Final         Passed - CMP within normal limits and completed in the last 12 months    Albumin  Date Value Ref Range Status  09/07/2022 4.1 3.8 - 4.8 g/dL Final  82/95/6213 3.7 3.4 - 5.0 g/dL Final   Alkaline Phosphatase  Date Value Ref Range Status  09/07/2022 121 44 - 121 IU/L Final  01/15/2014 125 (H) Unit/L Final    Comment:    45-117 NOTE: New Reference Range 07/21/13    ALT  Date Value Ref Range Status  09/07/2022 13 0 - 32  IU/L Final   SGPT (ALT)  Date Value Ref Range Status  01/15/2014 28 12 - 78 U/L Final   AST  Date Value Ref Range Status  09/07/2022 15 0 - 40 IU/L Final   SGOT(AST)  Date Value Ref Range Status  01/15/2014 27 15 - 37 Unit/L Final   BUN  Date Value Ref Range Status  09/07/2022 12 8 - 27 mg/dL Final  59/56/3875 11 7 - 18 mg/dL Final   Calcium  Date Value Ref Range Status  09/07/2022 8.9 8.7 - 10.3 mg/dL Final   Calcium, Total  Date Value Ref Range Status  01/15/2014 9.2 8.5 - 10.1 mg/dL Final   CO2  Date Value Ref Range Status  09/07/2022 22 20 - 29 mmol/L Final   Co2  Date Value Ref Range Status  01/15/2014 26 21 - 32 mmol/L Final   Bicarbonate  Date Value Ref Range Status  12/03/2019 29.0 (H) 20.0 - 28.0 mmol/L Final   Creatinine  Date Value Ref Range Status  01/15/2014 0.52 (L) 0.60 - 1.30 mg/dL Final   Creatinine, Ser  Date Value Ref Range Status  09/07/2022 1.16 (H) 0.57 - 1.00 mg/dL Final   Glucose  Date Value Ref Range Status  09/07/2022 89 70 - 99 mg/dL Final  64/33/2951 884 (H) 65 - 99 mg/dL Final   Glucose, Bld  Date Value Ref Range  Status  03/08/2020 118 (H) 70 - 99 mg/dL Final    Comment:    Glucose reference range applies only to samples taken after fasting for at least 8 hours.   Glucose-Capillary  Date Value Ref Range Status  09/10/2021 83 70 - 99 mg/dL Final    Comment:    Glucose reference range applies only to samples taken after fasting for at least 8 hours.   Potassium  Date Value Ref Range Status  09/07/2022 4.5 3.5 - 5.2 mmol/L Final  01/15/2014 4.2 3.5 - 5.1 mmol/L Final   Sodium  Date Value Ref Range Status  09/07/2022 138 134 - 144 mmol/L Final  01/15/2014 134 (L) 136 - 145 mmol/L Final   Bilirubin,Total  Date Value Ref Range Status  01/15/2014 0.4 0.2 - 1.0 mg/dL Final   Bilirubin Total  Date Value Ref Range Status  09/07/2022 0.4 0.0 - 1.2 mg/dL Final   Bilirubin, Direct  Date Value Ref Range Status  09/08/2018 0.3 (H) 0.0 - 0.2 mg/dL Final   Indirect Bilirubin  Date Value Ref Range Status  09/08/2018 0.6 0.3 - 0.9 mg/dL Final    Comment:    Performed at [facility], [address] [place], [place] [address]   Protein, ur  Date Value Ref Range Status  12/03/2019 100 (A) NEGATIVE mg/dL Final   Protein,UA  Date Value Ref Range Status  06/23/2021 Trace (A) Negative/Trace Final   Total Protein  Date Value Ref Range Status  09/07/2022 6.3 6.0 - 8.5 g/dL Final  30/16/0109 7.3 6.4 - 8.2 g/dL Final   EGFR (African American)  Date Value Ref Range Status  01/15/2014 >60  Final   GFR calc Af Amer  Date Value Ref Range Status  09/24/2020 83 >59 mL/min/1.73 Final    Comment:    **In accordance with recommendations from the NKF-ASN Task force,**   [facility] is in the process of updating its eGFR calculation to the   2021 CKD-EPI creatinine equation that estimates kidney function   without a race variable.    eGFR  Date Value Ref Range Status  09/07/2022 49 (L) >59 mL/min/1.73 Final   EGFR (Non-African Amer.)  Date Value Ref Range Status  01/15/2014 >60  Final     Comment:    eGFR values <22mL/min/1.73 m2 may be an indication of chronic kidney disease (CKD). Calculated eGFR is useful in patients with stable renal function. The eGFR calculation will not be reliable in acutely ill patients when serum creatinine is changing rapidly. It is not useful in  patients on dialysis. The eGFR calculation may not be applicable to patients at the low and high extremes of body sizes, pregnant women, and vegetarians.    GFR calc non Af Amer  Date Value Ref Range Status  09/24/2020 72 >59 mL/min/1.73 Final            Requested Prescriptions  Pending Prescriptions Disp Refills   QUEtiapine (SEROQUEL XR) 50 MG TB24 24 hr tablet [Pharmacy Med Name: QUEtiapine Fumarate ER 50 MG Oral Tablet Extended Release 24 Hour] 180 tablet 0    Sig: TAKE 2 TABLETS BY MOUTH AT BEDTIME     Not Delegated - Psychiatry:  Antipsychotics - Second Generation (Atypical) - quetiapine Failed - 04/15/2023  1:48 PM      Failed - This refill cannot be delegated      Failed - Valid encounter within last 6 months    Recent Outpatient Visits           7 months ago Type 2 diabetes mellitus with stage 3 chronic kidney disease, without long-term current use of insulin, unspecified whether stage 3a or 3b CKD (HCC)   [place] [facility] [place], [name], DO   10 months ago Hypertensive renal disease   [place] [facility] [place], [name], DO   1 year ago Type 2 diabetes mellitus with stage 3 chronic kidney disease, without long-term current use of insulin, unspecified whether stage 3a or 3b CKD (HCC)   [place] [facility] [place] [place], [name], DO   1 year ago Orthostatic hypotension   [place] [facility] [place], [place] P, DO   1 year ago Hypotension due to drugs   [place] [facility] [place], [place] P, DO              Failed - Lipid Panel in normal range within the last 12 months    Cholesterol,  Total  Date Value Ref Range Status  09/07/2022 164 100 - 199 mg/dL Final   Cholesterol  Date Value Ref Range Status  01/16/2014 258 (H) 0 - 200 mg/dL Final   Cholesterol Piccolo, Waived  Date Value Ref Range Status  02/04/2016 164 <200 mg/dL Final    Comment:                            Desirable                <200                         Borderline High      200- 239                         High                     >239    Ldl Cholesterol, Calc  Date Value Ref Range Status  01/16/2014 156 (H) 0 -  100 mg/dL Final   LDL Chol Calc (NIH)  Date Value Ref Range Status  09/07/2022 92 0 - 99 mg/dL Final   HDL Cholesterol  Date Value Ref Range Status  01/16/2014 35 (L) 40 - 60 mg/dL Final   HDL  Date Value Ref Range Status  09/07/2022 44 >39 mg/dL Final   Triglycerides  Date Value Ref Range Status  09/07/2022 158 (H) 0 - 149 mg/dL Final  65/78/4696 295 (H) 0 - 200 mg/dL Final   Triglycerides Piccolo,Waived  Date Value Ref Range Status  02/04/2016 258 (H) <150 mg/dL Final    Comment:                            Normal                   <150                         Borderline High     150 - 199                         High                200 - 499                         Very High                >499          Passed - TSH in normal range and within 360 days    TSH  Date Value Ref Range Status  09/07/2022 1.740 0.450 - 4.500 uIU/mL Final         Passed - Completed PHQ-2 or PHQ-9 in the last 360 days      Passed - Last BP in normal range    BP Readings from Last 1 Encounters:  09/07/22 115/67         Passed - Last Heart Rate in normal range    Pulse Readings from Last 1 Encounters:  09/07/22 74         Passed - CBC within normal limits and completed in the last 12 months    WBC  Date Value Ref Range Status  09/07/2022 4.5 3.4 - 10.8 x10E3/uL Final  03/08/2020 4.6 4.0 - 10.5 K/uL Final   RBC  Date Value Ref Range Status  09/07/2022 4.61 3.77 - 5.28  x10E6/uL Final  03/08/2020 4.04 3.87 - 5.11 MIL/uL Final   Hemoglobin  Date Value Ref Range Status  09/07/2022 12.9 11.1 - 15.9 g/dL Final   Hematocrit  Date Value Ref Range Status  09/07/2022 39.0 34.0 - 46.6 % Final   MCHC  Date Value Ref Range Status  09/07/2022 33.1 31.5 - 35.7 g/dL Final  28/41/3244 01.0 30.0 - 36.0 g/dL Final   [facility]  Date Value Ref Range Status  09/07/2022 28.0 26.6 - 33.0 pg Final  03/08/2020 27.5 26.0 - 34.0 pg Final   MCV  Date Value Ref Range Status  09/07/2022 85 79 - 97 fL Final  01/16/2014 84 80 - 100 fL Final   No results found for: "PLTCOUNTKUC", "LABPLAT", "POCPLA" RDW  Date Value Ref Range Status  09/07/2022 13.4 11.7 - 15.4 % Final  01/16/2014 14.1 11.5 - 14.5 % Final  Passed - CMP within normal limits and completed in the last 12 months    Albumin  Date Value Ref Range Status  09/07/2022 4.1 3.8 - 4.8 g/dL Final  16/06/9603 3.7 3.4 - 5.0 g/dL Final   Alkaline Phosphatase  Date Value Ref Range Status  09/07/2022 121 44 - 121 IU/L Final  01/15/2014 125 (H) Unit/L Final    Comment:    45-117 NOTE: New Reference Range 07/21/13    ALT  Date Value Ref Range Status  09/07/2022 13 0 - 32 IU/L Final   SGPT (ALT)  Date Value Ref Range Status  01/15/2014 28 12 - 78 U/L Final   AST  Date Value Ref Range Status  09/07/2022 15 0 - 40 IU/L Final   SGOT(AST)  Date Value Ref Range Status  01/15/2014 27 15 - 37 Unit/L Final   BUN  Date Value Ref Range Status  09/07/2022 12 8 - 27 mg/dL Final  54/05/8118 11 7 - 18 mg/dL Final   Calcium  Date Value Ref Range Status  09/07/2022 8.9 8.7 - 10.3 mg/dL Final   Calcium, Total  Date Value Ref Range Status  01/15/2014 9.2 8.5 - 10.1 mg/dL Final   CO2  Date Value Ref Range Status  09/07/2022 22 20 - 29 mmol/L Final   Co2  Date Value Ref Range Status  01/15/2014 26 21 - 32 mmol/L Final   Bicarbonate  Date Value Ref Range Status  12/03/2019 29.0 (H) 20.0 - 28.0 mmol/L  Final   Creatinine  Date Value Ref Range Status  01/15/2014 0.52 (L) 0.60 - 1.30 mg/dL Final   Creatinine, Ser  Date Value Ref Range Status  09/07/2022 1.16 (H) 0.57 - 1.00 mg/dL Final   Glucose  Date Value Ref Range Status  09/07/2022 89 70 - 99 mg/dL Final  14/78/2956 213 (H) 65 - 99 mg/dL Final   Glucose, Bld  Date Value Ref Range Status  03/08/2020 118 (H) 70 - 99 mg/dL Final    Comment:    Glucose reference range applies only to samples taken after fasting for at least 8 hours.   Glucose-Capillary  Date Value Ref Range Status  09/10/2021 83 70 - 99 mg/dL Final    Comment:    Glucose reference range applies only to samples taken after fasting for at least 8 hours.   Potassium  Date Value Ref Range Status  09/07/2022 4.5 3.5 - 5.2 mmol/L Final  01/15/2014 4.2 3.5 - 5.1 mmol/L Final   Sodium  Date Value Ref Range Status  09/07/2022 138 134 - 144 mmol/L Final  01/15/2014 134 (L) 136 - 145 mmol/L Final   Bilirubin,Total  Date Value Ref Range Status  01/15/2014 0.4 0.2 - 1.0 mg/dL Final   Bilirubin Total  Date Value Ref Range Status  09/07/2022 0.4 0.0 - 1.2 mg/dL Final   Bilirubin, Direct  Date Value Ref Range Status  09/08/2018 0.3 (H) 0.0 - 0.2 mg/dL Final   Indirect Bilirubin  Date Value Ref Range Status  09/08/2018 0.6 0.3 - 0.9 mg/dL Final    Comment:    Performed at [facility], [address] [place], [place] [address]   Protein, ur  Date Value Ref Range Status  12/03/2019 100 (A) NEGATIVE mg/dL Final   Protein,UA  Date Value Ref Range Status  06/23/2021 Trace (A) Negative/Trace Final   Total Protein  Date Value Ref Range Status  09/07/2022 6.3 6.0 - 8.5 g/dL Final  84/69/6295 7.3 6.4 - 8.2  g/dL Final   EGFR (African American)  Date Value Ref Range Status  01/15/2014 >60  Final   GFR calc Af Amer  Date Value Ref Range Status  09/24/2020 83 >59 mL/min/1.73 Final    Comment:    **In accordance with recommendations from the  NKF-ASN Task force,**   [facility] is in the process of updating its eGFR calculation to the   2021 CKD-EPI creatinine equation that estimates kidney function   without a race variable.    eGFR  Date Value Ref Range Status  09/07/2022 49 (L) >59 mL/min/1.73 Final   EGFR (Non-African Amer.)  Date Value Ref Range Status  01/15/2014 >60  Final    Comment:    eGFR values <24mL/min/1.73 m2 may be an indication of chronic kidney disease (CKD). Calculated eGFR is useful in patients with stable renal function. The eGFR calculation will not be reliable in acutely ill patients when serum creatinine is changing rapidly. It is not useful in  patients on dialysis. The eGFR calculation may not be applicable to patients at the low and high extremes of body sizes, pregnant women, and vegetarians.    GFR calc non Af Amer  Date Value Ref Range Status  09/24/2020 72 >59 mL/min/1.73 Final

## 2023-04-16 NOTE — Telephone Encounter (Signed)
Attempted to reach patient, LVM to schedule appointment overdue for follow up.  Once scheduled, route to provider for refill.  Put in CRM.

## 2023-04-16 NOTE — Telephone Encounter (Signed)
Patient is overdue for an appointment. Please call to schedule and then route to provider for refill.  

## 2023-04-19 NOTE — Telephone Encounter (Signed)
Called and scheduled patient on 05/17/2023 @ 4:00 pm.

## 2023-05-17 ENCOUNTER — Encounter: Payer: Self-pay | Admitting: Family Medicine

## 2023-05-17 ENCOUNTER — Ambulatory Visit (INDEPENDENT_AMBULATORY_CARE_PROVIDER_SITE_OTHER): Payer: Medicare Other | Admitting: Family Medicine

## 2023-05-17 VITALS — BP 128/66 | HR 68 | Temp 98.6°F | Wt 174.2 lb

## 2023-05-17 DIAGNOSIS — Z23 Encounter for immunization: Secondary | ICD-10-CM

## 2023-05-17 DIAGNOSIS — I200 Unstable angina: Secondary | ICD-10-CM

## 2023-05-17 DIAGNOSIS — J449 Chronic obstructive pulmonary disease, unspecified: Secondary | ICD-10-CM | POA: Diagnosis not present

## 2023-05-17 DIAGNOSIS — E782 Mixed hyperlipidemia: Secondary | ICD-10-CM | POA: Diagnosis not present

## 2023-05-17 DIAGNOSIS — N183 Chronic kidney disease, stage 3 unspecified: Secondary | ICD-10-CM | POA: Diagnosis not present

## 2023-05-17 DIAGNOSIS — I129 Hypertensive chronic kidney disease with stage 1 through stage 4 chronic kidney disease, or unspecified chronic kidney disease: Secondary | ICD-10-CM

## 2023-05-17 DIAGNOSIS — I5022 Chronic systolic (congestive) heart failure: Secondary | ICD-10-CM

## 2023-05-17 DIAGNOSIS — F321 Major depressive disorder, single episode, moderate: Secondary | ICD-10-CM | POA: Diagnosis not present

## 2023-05-17 DIAGNOSIS — E1122 Type 2 diabetes mellitus with diabetic chronic kidney disease: Secondary | ICD-10-CM | POA: Diagnosis not present

## 2023-05-17 DIAGNOSIS — I2510 Atherosclerotic heart disease of native coronary artery without angina pectoris: Secondary | ICD-10-CM

## 2023-05-17 DIAGNOSIS — I700 Atherosclerosis of aorta: Secondary | ICD-10-CM

## 2023-05-17 DIAGNOSIS — I25110 Atherosclerotic heart disease of native coronary artery with unstable angina pectoris: Secondary | ICD-10-CM

## 2023-05-17 MED ORDER — ALBUTEROL SULFATE 0.63 MG/3ML IN NEBU: 1.0000 | INHALATION_SOLUTION | Freq: Four times a day (QID) | RESPIRATORY_TRACT | 6 refills | Status: DC | PRN

## 2023-05-17 MED ORDER — ALBUTEROL SULFATE HFA 108 (90 BASE) MCG/ACT IN AERS: 2.0000 | INHALATION_SPRAY | Freq: Four times a day (QID) | RESPIRATORY_TRACT | 6 refills | Status: DC | PRN

## 2023-05-17 MED ORDER — BUPROPION HCL ER (XL) 300 MG PO TB24: 300.0000 mg | ORAL_TABLET | Freq: Every day | ORAL | 1 refills | Status: DC

## 2023-05-17 MED ORDER — SERTRALINE HCL 100 MG PO TABS: 200.0000 mg | ORAL_TABLET | Freq: Every day | ORAL | 1 refills | Status: DC

## 2023-05-17 MED ORDER — CLOPIDOGREL BISULFATE 75 MG PO TABS: 75.0000 mg | ORAL_TABLET | Freq: Every day | ORAL | 1 refills | Status: DC

## 2023-05-17 MED ORDER — ROSUVASTATIN CALCIUM 20 MG PO TABS: 20.0000 mg | ORAL_TABLET | Freq: Every day | ORAL | 1 refills | Status: DC

## 2023-05-17 MED ORDER — QUETIAPINE FUMARATE ER 50 MG PO TB24: 100.0000 mg | ORAL_TABLET | Freq: Every day | ORAL | 1 refills | Status: DC

## 2023-05-17 MED ORDER — TRAZODONE HCL 50 MG PO TABS: 50.0000 mg | ORAL_TABLET | Freq: Every evening | ORAL | 1 refills | Status: DC | PRN

## 2023-05-17 NOTE — Assessment & Plan Note (Signed)
Current cardiologist no longer takes her insurance. Will refer to new cardiologist. Call with any concerns. Keep BP and cholesterol under good control. Call with any concerns.

## 2023-05-17 NOTE — Assessment & Plan Note (Signed)
Under good control on current regimen. Continue current regimen. Continue to monitor. Call with any concerns. Refills given. Labs drawn today.   

## 2023-05-17 NOTE — Assessment & Plan Note (Signed)
Will keep BP and cholesterol under good control. New referral to cardiology placed today.

## 2023-05-17 NOTE — Progress Notes (Signed)
BP 128/66   Pulse 68   Temp 98.6 F (37 C) (Oral)   Wt 174 lb 3.2 oz (79 kg)   SpO2 96%   BMI 29.90 kg/m    Subjective:    [name], female    DOB: June 18, 1946, 77 y.o.   MRN: [id number]  HPI: Sarah White is a 77 y.o. female  Chief Complaint  Patient presents with   Diabetes   Hypotension   Hypertension   DIABETES Hypoglycemic episodes:no Polydipsia/polyuria: no Visual disturbance: no Chest pain: no Paresthesias: no Glucose Monitoring: yes  Accucheck frequency: Daily  Fasting glucose: 190, 241 Taking Insulin?: no Blood Pressure Monitoring: not checking Retinal Examination: Up to Date Foot Exam: Up to Date Diabetic Education: Completed Pneumovax: Up to Date Influenza: Up to Date Aspirin: yes  HYPERTENSION / HYPERLIPIDEMIA Satisfied with current treatment? yes Duration of hypertension: chronic BP monitoring frequency: not checking BP medication side effects: no Past BP meds: lasix, entresto, carvedilol,  Duration of hyperlipidemia: chronic Cholesterol medication side effects: no Cholesterol supplements: none Past cholesterol medications: crestor Medication compliance: excellent compliance Aspirin: no Recent stressors: no Recurrent headaches: no Visual changes: no Palpitations: no Dyspnea: no Chest pain: no Lower extremity edema: no Dizzy/lightheaded: no  DEPRESSION Mood status: stable Satisfied with current treatment?: yes Symptom severity: mild  Duration of current treatment : chronic Side effects: no Medication compliance: excellent compliance Psychotherapy/counseling: no  Previous psychiatric medications: seroquel, wellbutrin, sertaline Depressed mood: no Anxious mood: no Anhedonia: no Significant weight loss or gain: no Insomnia: no  Fatigue: no Feelings of worthlessness or guilt: no Impaired concentration/indecisiveness: no Suicidal ideations: no Hopelessness: no Crying spells: no    05/17/2023    4:12 PM  05/28/2022    4:30 PM 05/18/2022   10:44 AM 03/27/2022    1:26 PM 11/25/2021    1:30 PM  Depression screen PHQ 2/9  Decreased Interest 0 0 2 1 0  Down, Depressed, Hopeless 1 1 0 1 0  PHQ - 2 Score 1 1 2 2  0  Altered sleeping 0 3 2 0 0  Tired, decreased energy 1 0 0 1 0  Change in appetite 0 0 0 0 0  Feeling bad or failure about yourself  1 0 1 2 1   Trouble concentrating 0 0 0 0 0  Moving slowly or fidgety/restless 0 0 0 0 0  Suicidal thoughts 0 0 0 0 0  PHQ-9 Score 3 4 5 5 1   Difficult doing work/chores Somewhat difficult Very difficult Not difficult at all      Relevant past medical, surgical, family and social history reviewed and updated as indicated. Interim medical history since our last visit reviewed. Allergies and medications reviewed and updated.  Review of Systems  Constitutional: Negative.   Respiratory: Negative.    Cardiovascular: Negative.   Gastrointestinal: Negative.   Musculoskeletal: Negative.   Neurological: Negative.   Psychiatric/Behavioral: Negative.      Per HPI unless specifically indicated above     Objective:    BP 128/66   Pulse 68   Temp 98.6 F (37 C) (Oral)   Wt 174 lb 3.2 oz (79 kg)   SpO2 96%   BMI 29.90 kg/m   Wt Readings from Last 3 Encounters:  05/17/23 174 lb 3.2 oz (79 kg)  09/07/22 158 lb 3.2 oz (71.8 kg)  05/28/22 167 lb 6.4 oz (75.9 kg)    Physical Exam Vitals and nursing note reviewed.  Constitutional:  General: She is not in acute distress.    Appearance: Normal appearance. She is not ill-appearing, toxic-appearing or diaphoretic.  HENT:     Head: Normocephalic and atraumatic.     Right Ear: External ear normal.     Left Ear: External ear normal.     Nose: Nose normal.     Mouth/Throat:     Mouth: Mucous membranes are moist.     Pharynx: Oropharynx is clear.  Eyes:     General: No scleral icterus.       Right eye: No discharge.        Left eye: No discharge.     Extraocular Movements: Extraocular movements  intact.     Conjunctiva/sclera: Conjunctivae normal.     Pupils: Pupils are equal, round, and reactive to light.  Cardiovascular:     Rate and Rhythm: Normal rate and regular rhythm.     Pulses: Normal pulses.     Heart sounds: Normal heart sounds. No murmur heard.    No friction rub. No gallop.  Pulmonary:     Effort: Pulmonary effort is normal. No respiratory distress.     Breath sounds: Normal breath sounds. No stridor. No wheezing, rhonchi or rales.  Chest:     Chest wall: No tenderness.  Musculoskeletal:        General: Normal range of motion.     Cervical back: Normal range of motion and neck supple.  Skin:    General: Skin is warm and dry.     Capillary Refill: Capillary refill takes less than 2 seconds.     Coloration: Skin is not jaundiced or pale.     Findings: No bruising, erythema, lesion or rash.  Neurological:     General: No focal deficit present.     Mental Status: She is alert and oriented to person, place, and time. Mental status is at baseline.  Psychiatric:        Mood and Affect: Mood normal.        Behavior: Behavior normal.        Thought Content: Thought content normal.        Judgment: Judgment normal.     Results for orders placed or performed in visit on 01/07/23  HM DIABETES EYE EXAM  Result Value Ref Range   HM Diabetic Eye Exam No Retinopathy No Retinopathy      Assessment & Plan:   Problem List Items Addressed This Visit       Cardiovascular and Mediastinum   CAD (coronary artery disease)    Current cardiologist no longer takes her insurance. Will refer to new cardiologist. Call with any concerns. Keep BP and cholesterol under good control. Call with any concerns.       Relevant Medications   rosuvastatin (CRESTOR) 20 MG tablet   Other Relevant Orders   Ambulatory referral to Cardiology   Unstable angina [facility])    Current cardiologist no longer takes her insurance. Will refer to new cardiologist. Call with any concerns. Keep BP and  cholesterol under good control. Call with any concerns.       Relevant Medications   rosuvastatin (CRESTOR) 20 MG tablet   Other Relevant Orders   Ambulatory referral to Cardiology   Aortic atherosclerosis (HCC) - Primary    Will keep BP and cholesterol under good control. New referral to cardiology placed today.       Relevant Medications   rosuvastatin (CRESTOR) 20 MG tablet   Other Relevant Orders   CBC  with Differential/Platelet   Comprehensive metabolic panel   Ambulatory referral to Cardiology   Chronic systolic heart failure (HCC)    Euvolemic today. Current cardiologist no longer takes her insurance. Will refer to new cardiologist. Call with any concerns. Keep BP and cholesterol under good control. Call with any concerns.       Relevant Medications   rosuvastatin (CRESTOR) 20 MG tablet     Respiratory   COPD (chronic obstructive pulmonary disease) (HCC)    Doing well. No complaints. Continue to monitor.       Relevant Medications   albuterol (ACCUNEB) 0.63 MG/3ML nebulizer solution   albuterol (VENTOLIN HFA) 108 (90 Base) MCG/ACT inhaler   Other Relevant Orders   CBC with Differential/Platelet   Comprehensive metabolic panel     Endocrine   Diabetes mellitus with chronic kidney disease (HCC)    States that her sugars have been running high. Will check labs and await results. Treat as needed.       Relevant Medications   rosuvastatin (CRESTOR) 20 MG tablet   Other Relevant Orders   CBC with Differential/Platelet   Comprehensive metabolic panel   Microalbumin, Urine Waived   Hgb A1c w/o eAG     Genitourinary   Hypertensive renal disease    Under good control on current regimen. Continue current regimen. Continue to monitor. Call with any concerns. Refills given. Labs drawn today.       Relevant Orders   CBC with Differential/Platelet   Comprehensive metabolic panel   TSH   Microalbumin, Urine Waived     Other   Hyperlipidemia    Under good control  on current regimen. Continue current regimen. Continue to monitor. Call with any concerns. Refills given. Labs drawn today.        Relevant Medications   rosuvastatin (CRESTOR) 20 MG tablet   Other Relevant Orders   CBC with Differential/Platelet   Comprehensive metabolic panel   Lipid Panel w/o Chol/HDL Ratio   Depression, major, single episode, moderate (HCC)    Under good control on current regimen. Continue current regimen. Continue to monitor. Call with any concerns. Refills given.        Relevant Medications   buPROPion (WELLBUTRIN XL) 300 MG 24 hr tablet   sertraline (ZOLOFT) 100 MG tablet   traZODone (DESYREL) 50 MG tablet   Other Relevant Orders   CBC with Differential/Platelet   Comprehensive metabolic panel   Other Visit Diagnoses     Need for influenza vaccination       Flu shot given today.   Relevant Orders   Flu Vaccine Trivalent High Dose (Fluad)   Need for COVID-19 vaccine       COVID shot given today.   Relevant Orders   Pfizer Comirnaty Covid -19 Vaccine 69yrs and older        Follow up plan: Return in about 3 months (around 08/16/2023) for with me, ASAP after 9/18 with Gunnar Fusi for AWV.

## 2023-05-17 NOTE — Assessment & Plan Note (Signed)
Euvolemic today. Current cardiologist no longer takes her insurance. Will refer to new cardiologist. Call with any concerns. Keep BP and cholesterol under good control. Call with any concerns.

## 2023-05-17 NOTE — Assessment & Plan Note (Signed)
Doing well. No complaints. Continue to monitor.

## 2023-05-17 NOTE — Assessment & Plan Note (Signed)
States that her sugars have been running high. Will check labs and await results. Treat as needed.

## 2023-05-17 NOTE — Assessment & Plan Note (Signed)
Under good control on current regimen. Continue current regimen. Continue to monitor. Call with any concerns. Refills given.   

## 2023-05-18 LAB — CBC WITH DIFFERENTIAL/PLATELET
Basophils Absolute: 0 10*3/uL (ref 0.0–0.2)
Basos: 1 %
EOS (ABSOLUTE): 0.1 10*3/uL (ref 0.0–0.4)
Eos: 2 %
Hematocrit: 41.3 % (ref 34.0–46.6)
Hemoglobin: 13.2 g/dL (ref 11.1–15.9)
Immature Grans (Abs): 0 10*3/uL (ref 0.0–0.1)
Immature Granulocytes: 1 %
Lymphocytes Absolute: 0.9 10*3/uL (ref 0.7–3.1)
Lymphs: 16 %
MCH: 27.8 pg (ref 26.6–33.0)
MCHC: 32 g/dL (ref 31.5–35.7)
MCV: 87 fL (ref 79–97)
Monocytes Absolute: 0.4 10*3/uL (ref 0.1–0.9)
Monocytes: 7 %
Neutrophils Absolute: 4.3 10*3/uL (ref 1.4–7.0)
Neutrophils: 73 %
Platelets: 151 10*3/uL (ref 150–450)
RBC: 4.75 x10E6/uL (ref 3.77–5.28)
RDW: 14.1 % (ref 11.7–15.4)
WBC: 5.8 10*3/uL (ref 3.4–10.8)

## 2023-05-18 LAB — COMPREHENSIVE METABOLIC PANEL
ALT: 14 IU/L (ref 0–32)
AST: 17 IU/L (ref 0–40)
Albumin: 4.2 g/dL (ref 3.8–4.8)
Alkaline Phosphatase: 115 IU/L (ref 44–121)
BUN/Creatinine Ratio: 12 (ref 12–28)
BUN: 13 mg/dL (ref 8–27)
Bilirubin Total: 0.3 mg/dL (ref 0.0–1.2)
CO2: 20 mmol/L (ref 20–29)
Calcium: 8.9 mg/dL (ref 8.7–10.3)
Chloride: 105 mmol/L (ref 96–106)
Creatinine, Ser: 1.08 mg/dL — ABNORMAL HIGH (ref 0.57–1.00)
Globulin, Total: 1.8 g/dL (ref 1.5–4.5)
Glucose: 83 mg/dL (ref 70–99)
Potassium: 4.7 mmol/L (ref 3.5–5.2)
Sodium: 142 mmol/L (ref 134–144)
Total Protein: 6 g/dL (ref 6.0–8.5)
eGFR: 54 mL/min/{1.73_m2} — ABNORMAL LOW (ref >59)

## 2023-05-18 LAB — TSH: TSH: 2.53 u[IU]/mL (ref 0.450–4.500)

## 2023-05-18 LAB — LIPID PANEL W/O CHOL/HDL RATIO
Cholesterol, Total: 161 mg/dL (ref 100–199)
HDL: 49 mg/dL (ref >39)
LDL Chol Calc (NIH): 86 mg/dL (ref 0–99)
Triglycerides: 153 mg/dL — ABNORMAL HIGH (ref 0–149)
VLDL Cholesterol Cal: 26 mg/dL (ref 5–40)

## 2023-05-18 LAB — HGB A1C W/O EAG: Hgb A1c MFr Bld: 5.6 % (ref 4.8–5.6)

## 2023-06-10 ENCOUNTER — Other Ambulatory Visit: Payer: Self-pay | Admitting: Family Medicine

## 2023-06-22 ENCOUNTER — Ambulatory Visit: Payer: Medicare Other | Admitting: Emergency Medicine

## 2023-06-22 VITALS — Ht 64.0 in | Wt 170.0 lb

## 2023-06-22 DIAGNOSIS — Z0000 Encounter for general adult medical examination without abnormal findings: Secondary | ICD-10-CM | POA: Diagnosis not present

## 2023-06-22 NOTE — Patient Instructions (Addendum)
[name] , Thank you for taking time to come for your Medicare Wellness Visit. I appreciate your ongoing commitment to your health goals. Please review the following plan we discussed and let me know if I can assist you in the future.   Referrals/Orders/Follow-Ups/Clinician Recommendations: Please call [facility] Radiology scheduling at [phone] to schedule a low dose CT scan of your lungs to screen for lung cancer at your earliest convenience. You should be getting these every year.  This is a list of the screening recommended for you and due dates:  Health Maintenance  Topic Date Due   Zoster (Shingles) Vaccine (1 of 2) 07/06/1967   Screening for Lung Cancer  09/09/2019   Yearly kidney health urinalysis for diabetes  03/28/2023   Mammogram  09/08/2023*   DEXA scan (bone density measurement)  09/08/2023*   COVID-19 Vaccine (5 - 2023-24 season) 07/12/2023   Hemoglobin A1C  11/14/2023   Eye exam for diabetics  12/30/2023   Yearly kidney function blood test for diabetes  05/16/2024   Complete foot exam   05/16/2024   Medicare Annual Wellness Visit  06/21/2024   Colon Cancer Screening  09/10/2024   DTaP/Tdap/Td vaccine (2 - Td or Tdap) 09/23/2025   Pneumonia Vaccine  Completed   Flu Shot  Completed   Hepatitis C Screening  Completed   HPV Vaccine  Aged Out  *Topic was postponed. The date shown is not the original due date.    Advanced directives: (In Chart) A copy of your advanced directives are scanned into your chart should your provider ever need it.  Next Medicare Annual Wellness Visit scheduled for next year: Yes, 06/27/24 @ 9:20am  Fall Prevention in the Home, Adult Falls can cause injuries and affect people of all ages. There are many simple things that you can do to make your home safe and to help prevent falls. If you need it, ask for help making these changes. What actions can I take to prevent falls? General information Use good lighting in all rooms. Make sure  to: Replace any light bulbs that burn out. Turn on lights if it is dark and use night-lights. Keep items that you use often in easy-to-reach places. Lower the shelves around your home if needed. Move furniture so that there are clear paths around it. Do not keep throw rugs or other things on the floor that can make you trip. If any of your floors are uneven, fix them. Add color or contrast paint or tape to clearly mark and help you see: Grab bars or handrails. First and last steps of staircases. Where the edge of each step is. If you use a ladder or stepladder: Make sure that it is fully opened. Do not climb a closed ladder. Make sure the sides of the ladder are locked in place. Have someone hold the ladder while you use it. Know where your pets are as you move through your home. What can I do in the bathroom?     Keep the floor dry. Clean up any water that is on the floor right away. Remove soap buildup in the bathtub or shower. Buildup makes bathtubs and showers slippery. Use non-skid mats or decals on the floor of the bathtub or shower. Attach bath mats securely with double-sided, non-slip rug tape. If you need to sit down while you are in the shower, use a non-slip stool. Install grab bars by the toilet and in the bathtub and shower. Do not use towel bars as grab  bars. What can I do in the bedroom? Make sure that you have a light by your bed that is easy to reach. Do not use any sheets or blankets on your bed that hang to the floor. Have a firm bench or chair with side arms that you can use for support when you get dressed. What can I do in the kitchen? Clean up any spills right away. If you need to reach something above you, use a sturdy step stool that has a grab bar. Keep electrical cables out of the way. Do not use floor polish or wax that makes floors slippery. What can I do with my stairs? Do not leave anything on the stairs. Make sure that you have a light switch at  the top and the bottom of the stairs. Have them installed if you do not have them. Make sure that there are handrails on both sides of the stairs. Fix handrails that are broken or loose. Make sure that handrails are as long as the staircases. Install non-slip stair treads on all stairs in your home if they do not have carpet. Avoid having throw rugs at the top or bottom of stairs, or secure the rugs with carpet tape to prevent them from moving. Choose a carpet design that does not hide the edge of steps on the stairs. Make sure that carpet is firmly attached to the stairs. Fix any carpet that is loose or worn. What can I do on the outside of my home? Use bright outdoor lighting. Repair the edges of walkways and driveways and fix any cracks. Clear paths of anything that can make you trip, such as tools or rocks. Add color or contrast paint or tape to clearly mark and help you see high doorway thresholds. Trim any bushes or trees on the main path into your home. Check that handrails are securely fastened and in good repair. Both sides of all steps should have handrails. Install guardrails along the edges of any raised decks or porches. Have leaves, snow, and ice cleared regularly. Use sand, salt, or ice melt on walkways during winter months if you live where there is ice and snow. In the garage, clean up any spills right away, including grease or oil spills. What other actions can I take? Review your medicines with your health care provider. Some medicines can make you confused or feel dizzy. This can increase your chance of falling. Wear closed-toe shoes that fit well and support your feet. Wear shoes that have rubber soles and low heels. Use a cane, walker, scooter, or crutches that help you move around if needed. Talk with your provider about other ways that you can decrease your risk of falls. This may include seeing a physical therapist to learn to do exercises to improve movement and  strength. Where to find more information Centers for Disease Control and Prevention, [employer]: TonerPromos.no [employer] on Aging: BaseRingTones.pl National Institute on Aging: BaseRingTones.pl Contact a health care provider if: You are afraid of falling at home. You feel weak, drowsy, or dizzy at home. You fall at home. Get help right away if you: Lose consciousness or have trouble moving after a fall. Have a fall that causes a head injury. These symptoms may be an emergency. Get help right away. Call 911. Do not wait to see if the symptoms will go away. Do not drive yourself to the hospital. This information is not intended to replace advice given to you by your health care provider. Make  sure you discuss any questions you have with your health care provider. Document Revised: 04/20/2022 Document Reviewed: 04/20/2022 Elsevier Patient Education  2024 [employer].

## 2023-06-22 NOTE — Progress Notes (Signed)
Subjective:   Sarah White is a 77 y.o. female who presents for Medicare Annual (Subsequent) preventive examination.  Visit Complete: Virtual I connected with  Sarah White on 06/22/23 by a audio enabled telemedicine application and verified that I am speaking with the correct person using two identifiers.  Patient Location: Home  Provider Location: Home Office  I discussed the limitations of evaluation and management by telemedicine. The patient expressed understanding and agreed to proceed.  Vital Signs: Because this visit was a virtual/telehealth visit, some criteria may be missing or patient reported. Any vitals not documented were not able to be obtained and vitals that have been documented are patient reported.  Patient Medicare AWV questionnaire was completed by the patient on 06/18/23; I have confirmed that all information answered by patient is correct and no changes since this date.  Cardiac Risk Factors include: advanced age (>2men, >14 women);diabetes mellitus;dyslipidemia;smoking/ tobacco exposure;Other (see comment), Risk factor comments: CAD     Objective:    Today's Vitals   06/18/23 1158 06/22/23 0957  Weight:  170 lb (77.1 kg)  Height:  5\' 4"  (1.626 m)  PainSc: 7     Body mass index is 29.18 kg/m.     06/22/2023   10:16 AM 05/18/2022   10:38 AM 09/10/2021    8:09 AM 04/25/2021    2:38 PM 07/01/2020    3:06 PM 04/22/2020    1:14 PM 04/22/2020    1:13 PM  Advanced Directives  Does Patient Have a Medical Advance Directive? Yes Yes Yes Yes Yes Yes Yes  Type of Estate agent of [place];Living will Healthcare Power of [employer] of [place];Living will Healthcare Power of [place];Living will Living will Living will  Does patient want to make changes to medical advance directive? No - Patient declined        Copy of Healthcare Power of Attorney in Chart? Yes - validated most recent copy scanned in chart (See row  information) Yes - validated most recent copy scanned in chart (See row information)  Yes - validated most recent copy scanned in chart (See row information)       Current Medications (verified) Outpatient Encounter Medications as of 06/22/2023  Medication Sig   albuterol (VENTOLIN HFA) 108 (90 Base) MCG/ACT inhaler Inhale 2 puffs into the lungs every 6 (six) hours as needed for wheezing or shortness of breath.   aspirin EC 81 MG tablet Take 81 mg by mouth at bedtime.    buPROPion (WELLBUTRIN XL) 300 MG 24 hr tablet Take 1 tablet (300 mg total) by mouth daily.   carvedilol (COREG) 12.5 MG tablet Take 12.5 mg by mouth daily.   clopidogrel (PLAVIX) 75 MG tablet Take 1 tablet (75 mg total) by mouth daily at 6 (six) AM.   diphenhydrAMINE (BENADRYL) 25 MG tablet Take 25 mg by mouth at bedtime as needed for allergies. Dies not take regularly   donepezil (ARICEPT) 10 MG tablet TAKE 1 TABLET BY MOUTH NIGHTLY FOR 30 DAYS   furosemide (LASIX) 40 MG tablet Take 1 tablet (40 mg total) by mouth daily.   Glycopyrrolate-Formoterol (BEVESPI AEROSPHERE) 9-4.8 MCG/ACT AERO Inhale 2 puffs into the lungs 2 (two) times daily.   lidocaine (LIDODERM) 5 % Place 1 patch onto the skin daily. Remove & Discard patch within 12 hours or as directed by MD   nitroGLYCERIN (NITROSTAT) 0.4 MG SL tablet DISSOLVE ONE TABLET UNDER THE TONGUE EVERY 5 MINUTES AS NEEDED FOR CHEST PAIN.  DO NOT EXCEED A TOTAL OF 3 DOSES IN 15 MINUTES   ondansetron (ZOFRAN) 8 MG tablet Take 1 tablet (8 mg total) by mouth every 8 (eight) hours as needed for nausea or vomiting.   pantoprazole (PROTONIX) 40 MG tablet Take 1 tablet (40 mg total) by mouth 2 (two) times daily before a meal.   promethazine (PHENERGAN) 25 MG suppository Place 1 suppository (25 mg total) rectally every 6 (six) hours as needed for nausea or vomiting.   QUEtiapine (SEROQUEL XR) 50 MG TB24 24 hr tablet Take 2 tablets (100 mg total) by mouth at bedtime.   rosuvastatin (CRESTOR) 20  MG tablet Take 1 tablet (20 mg total) by mouth daily.   sertraline (ZOLOFT) 100 MG tablet Take 2 tablets (200 mg total) by mouth daily.   traZODone (DESYREL) 50 MG tablet Take 1 tablet (50 mg total) by mouth at bedtime as needed. for sleep   triamcinolone ointment (KENALOG) 0.5 % Apply 1 application topically 2 (two) times daily.   albuterol (ACCUNEB) 0.63 MG/3ML nebulizer solution Take 3 mLs (0.63 mg total) by nebulization every 6 (six) hours as needed for wheezing. Dx j44.9 (Patient not taking: Reported on 06/22/2023)   amiodarone (PACERONE) 200 MG tablet Take 0.5 tablets by mouth daily. (Patient not taking: Reported on 06/22/2023)   cyclobenzaprine (FLEXERIL) 10 MG tablet Take 1 tablet by mouth three times daily as needed for muscle spasm (Patient not taking: Reported on 06/22/2023)   sacubitril-valsartan (ENTRESTO) 49-51 MG Take 1 tablet by mouth 2 (two) times daily.  (Patient not taking: Reported on 06/22/2023)   No facility-administered encounter medications on file as of 06/22/2023.    Allergies (verified) Amlodipine, Atorvastatin, Codeine, Hctz [hydrochlorothiazide], Lisinopril, and Metformin and related   History: 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, right    Past Surgical History:  Procedure Laterality Date   CAROTID PTA/STENT INTERVENTION Right 03/06/2020   Procedure: CAROTID PTA/STENT INTERVENTION;  Surgeon: Renford Dills, MD;  Location: [facility] IN[facility]IVE CV [facility];  Service: Cardiovascular;   Laterality: Right;   CARPAL TUNNEL RELEASE Bilateral    CESAREAN SECTION     CHOLECYSTECTOMY N/A 09/12/2018   Procedure: LAPAROSCOPIC CHOLECYSTECTOMY WITH INTRAOPERATIVE CHOLANGIOGRAM;  Surgeon: Carolan Shiver, MD;  Location: [facility] ORS;  Service: General;  Laterality: N/A;   COLONOSCOPY WITH PROPOFOL N/A 09/10/2021   Procedure: COLONOSCOPY WITH PROPOFOL;  Surgeon: Toney Reil, MD;  Location: [facility] ENDOSCOPY;  Service: Gastroenterology;  Laterality: N/A;   CORONARY ANGIOPLASTY WITH STENT PLACEMENT     ESOPHAGOGASTRODUODENOSCOPY N/A 09/10/2021   Procedure: ESOPHAGOGASTRODUODENOSCOPY (EGD);  Surgeon: Toney Reil, MD;  Location: [facility] ENDOSCOPY;  Service: Gastroenterology;  Laterality: N/A;   KNEE SURGERY Left    LEFT HEART CATH AND CORONARY ANGIOGRAPHY Right 11/26/2016   Procedure: Left Heart Cath and Coronary Angiography;  Surgeon: Marcina Millard, MD;  Location: [facility];  Service: Cardiovascular;  Laterality: Right;   LEFT HEART CATH AND CORONARY ANGIOGRAPHY N/A 01/23/2020   Procedure: LEFT  HEART CATH AND CORONARY ANGIOGRAPHY;  Surgeon: Alwyn Pea, MD;  Location: [facility];  Service: Cardiovascular;  Laterality: N/A;   TOTAL ABDOMINAL HYSTERECTOMY     Family History  Problem Relation Age of Onset   Heart disease Mother    Social History   Socioeconomic History   Marital status: Divorced    Spouse name: Not on file   Number of children: 2   Years of education: Not on file   Highest education level: Some college, no degree  Occupational History   Occupation: retired  Tobacco Use   Smoking status: Every Day    Current packs/day: 1.00    Average packs/day: 1 pack/day for 64.8 years (64.8 ttl pk-yrs)    Types: Cigarettes    Start date: 1960   Smokeless tobacco: Never  Vaping Use   Vaping status: Never Used  Substance and Sexual Activity   Alcohol use: No   Drug use: No   Sexual activity: Not Currently  Other Topics Concern    Not on file  Social History Narrative   2 children, 1 living   Social Determinants of Health   Financial Resource Strain: Low Risk  (06/18/2023)   Overall Financial Resource Strain (CARDIA)    Difficulty of Paying Living Expenses: Not very hard  Food Insecurity: No Food Insecurity (06/18/2023)   Hunger Vital Sign    Worried About Running Out of Food in the Last Year: Never true    Ran Out of Food in the Last Year: Never true  Transportation Needs: No Transportation Needs (06/18/2023)   PRAPARE - Administrator, Civil Service (Medical): No    Lack of Transportation (Non-Medical): No  Physical Activity: Inactive (06/18/2023)   Exercise Vital Sign    Days of Exercise per Week: 0 days    Minutes of Exercise per Session: 0 min  Stress: No Stress Concern Present (06/18/2023)   [employer] of Occupational Health - Occupational Stress Questionnaire    Feeling of Stress : Only a little  Recent Concern: Stress - Stress Concern Present (05/13/2023)   [employer] of Occupational Health - Occupational Stress Questionnaire    Feeling of Stress : To some extent  Social Connections: Socially Isolated (06/18/2023)   Social Connection and Isolation Panel [NHANES]    Frequency of Communication with Friends and Family: Once a week    Frequency of Social Gatherings with Friends and Family: Never    Attends Religious Services: Never    Diplomatic Services operational officer: No    Attends Engineer, structural: Never    Marital Status: Divorced    Tobacco Counseling Ready to quit: Not Answered Counseling given: Not Answered   Clinical Intake:  Pre-visit preparation completed: Yes  Pain : 0-10 Pain Score: 7  Pain Type: Chronic pain Pain Location: Shoulder Pain Orientation: Left Pain Descriptors / Indicators: Aching     BMI - recorded: 29.18 Nutritional Status: BMI 25 -29 Overweight Nutritional Risks: None Diabetes: Yes CBG done?: No Did pt. bring in  CBG monitor from home?: No  How often do you need to have someone help you when you read instructions, pamphlets, or other written materials from your doctor or pharmacy?: 3 - Sometimes (can't see small writing, uses magnifying glass)  Interpreter Needed?: No  Information entered by :: Tora Kindred, CMa   Activities of Daily Living    06/18/2023   11:58 AM  In your present state of health, do you have  any difficulty performing the following activities:  Hearing? 1  Comment doesn't wear hearing aids  Vision? 1  Comment fine print only  Difficulty concentrating or making decisions? 1  Comment son helps her  Walking or climbing stairs? 1  Comment uses a walker  Dressing or bathing? 0  Doing errands, shopping? 1  Comment doesn't drive, son drives for her  Preparing Food and eating ? Y  Comment son helps prepare food  Using the Toilet? N  In the past six months, have you accidently leaked urine? Y  Comment depends  Do you have problems with loss of bowel control? N  Managing your Medications? N  Managing your Finances? N  Housekeeping or managing your Housekeeping? Y  Comment son helps    Patient Care Team: Dorcas Carrow, [name] Alwyn Pea, MD as Consulting Physician (Cardiology) Alwyn Pea, MD as Consulting Physician (Cardiology) Zettie Pho, [facility] (Inactive) (Pharmacist)  Indicate any recent Medical Services you may have received from other than [facility] providers in the past year (date may be approximate).     Assessment:   This is a routine wellness examination for Anysa.  Hearing/Vision screen Hearing Screening - Comments:: Has hearing loss Vision Screening - Comments:: Gets routine eye exams   Goals Addressed               This Visit's Progress     Patient Stated (pt-stated)        "Don't fall"      Depression Screen    06/22/2023   10:13 AM 05/17/2023    4:12 PM 05/28/2022    4:30 PM 05/18/2022    10:44 AM 03/27/2022    1:26 PM 11/25/2021    1:30 PM 10/23/2021    2:04 PM  PHQ 2/9 Scores  PHQ - 2 Score 1 1 1 2 2  0 2  PHQ- 9 Score 1 3 4 5 5 1 4     Fall Risk    06/18/2023   11:58 AM 05/17/2023    4:12 PM 05/18/2022   10:39 AM 03/27/2022    1:26 PM 11/25/2021    1:31 PM  Fall Risk   Falls in the past year? 1 0  1 1  Number falls in past yr: 1 0 1 1 1   Injury with Fall? 0 0 1 0 1  Risk for fall due to : Orthopedic patient;History of fall(s);Impaired balance/gait;Impaired mobility;Mental status change No Fall Risks Impaired balance/gait History of fall(s) History of fall(s)  Follow up  Falls evaluation completed Falls evaluation completed;Falls prevention discussed;Education provided Falls evaluation completed Falls evaluation completed    MEDICARE RISK AT HOME: Medicare Risk at Home Any stairs in or around the home?: Yes If so, are there any without handrails?: No Home free of loose throw rugs in walkways, pet beds, electrical cords, etc?: Yes Adequate lighting in your home to reduce risk of falls?: Yes Life alert?: No Use of a cane, walker or w/c?: Yes Grab bars in the bathroom?: Yes Shower chair or bench in shower?: Yes Elevated toilet seat or a handicapped toilet?: Yes  TIMED UP AND GO:  Was the test performed?  No    Cognitive Function:        06/22/2023   10:18 AM 05/18/2022   10:41 AM 04/25/2021    2:42 PM 04/22/2020    1:22 PM 02/15/2019    2:46 PM  6CIT Screen  What Year? 0 points 0 points  0 points 0 points 0 points  What month? 0 points 0 points 0 points 0 points 0 points  What time? 0 points  0 points 3 points 0 points  Count back from 20 0 points 2 points 0 points 0 points 0 points  Months in reverse 2 points 0 points 0 points 4 points 0 points  Repeat phrase 2 points 0 points 2 points 4 points 2 points  Total Score 4 points  2 points 11 points 2 points    Immunizations Immunization History  Administered Date(s) Administered   Fluad Quad(high Dose 65+)  04/28/2019, 05/24/2020, 06/23/2021, 05/28/2022   Fluad Trivalent(High Dose 65+) 05/17/2023   Influenza, High Dose Seasonal PF 05/18/2016, 05/25/2017, 05/05/2018   Influenza, Seasonal, Injecte, Preservative Fre 06/05/2015   PFIZER(Purple Top)SARS-COV-2 Vaccination 10/16/2019, 11/06/2019, 09/24/2020   Pfizer(Comirnaty)Fall Seasonal Vaccine 12 years and older 05/17/2023   Pneumococcal Conjugate-13 07/29/2015   Pneumococcal Polysaccharide-23 09/15/2016   Tdap 09/24/2015   Zoster, Live 02/07/2015    TDAP status: Up to date  Flu Vaccine status: Up to date  Pneumococcal vaccine status: Up to date  Covid-19 vaccine status: Information provided on how to obtain vaccines.   Qualifies for Shingles Vaccine? Yes   Zostavax completed Yes   Shingrix Completed?: No.    Education has been provided regarding the importance of this vaccine. Patient has been advised to call insurance company to determine out of pocket expense if they have not yet received this vaccine. Advised may also receive vaccine at local pharmacy or Health Dept. Verbalized acceptance and understanding.  Screening Tests Health Maintenance  Topic Date Due   Zoster Vaccines- Shingrix (1 of 2) 07[birth year]06/1967   Lung Cancer Screening  09/09/2019   Diabetic kidney evaluation - Urine ACR  03/28/2023   MAMMOGRAM  09/08/2023 (Originally 07/05/1998)   DEXA SCAN  09/08/2023 (Originally 07/05/2013)   COVID-19 Vaccine (5 - 2023-24 season) 07/12/2023   HEMOGLOBIN A1C  11/14/2023   OPHTHALMOLOGY EXAM  12/30/2023   Diabetic kidney evaluation - eGFR measurement  05/16/2024   FOOT EXAM  05/16/2024   Medicare Annual Wellness (AWV)  06/21/2024   Colonoscopy  09/10/2024   DTaP/Tdap/Td (2 - Td or Tdap) 09/23/2025   Pneumonia Vaccine 40+ Years old  Completed   INFLUENZA VACCINE  Completed   Hepatitis C Screening  Completed   HPV VACCINES  Aged Out    Health Maintenance  Health Maintenance Due  Topic Date Due   Zoster Vaccines- Shingrix (1 of  2) 07[birth year]06[birth year]1967   Lung Cancer Screening  09/09/2019   Diabetic kidney evaluation - Urine ACR  03/28/2023    Colorectal cancer screening: Type of screening: Colonoscopy. Completed 09/10/21. Repeat every 3 years  Mammogram status: No longer required due to patient declined.  Bone Density Status: patient declined DEXA  Lung Cancer Screening: (Low Dose CT Chest recommended if Age 51-80 years, 20 pack-year currently smoking OR have quit w/in 15years.) does qualify.   Lung Cancer Screening Referral: 09/07/22  Additional Screening:  Hepatitis C Screening: does not qualify; Completed 08/12/15  Vision Screening: Recommended annual ophthalmology exams for early detection of glaucoma and other disorders of the eye. Is the patient up to date with their annual eye exam?  Yes  Who is the provider or what is the name of the office in which the patient attends annual eye exams? [facility] If pt is not established with a provider, would they like to be referred to a provider to establish care? No .   Dental  Screening: Recommended annual dental exams for proper oral hygiene  Diabetic Foot Exam: Diabetic Foot Exam: Completed 05/17/23  Community Resource Referral / Chronic Care Management: CRR required this visit?  No   CCM required this visit?  No     Plan:     I have personally reviewed and noted the following in the patient's chart:   Medical and social history Use of alcohol, tobacco or illicit drugs  Current medications and supplements including opioid prescriptions. Patient is not currently taking opioid prescriptions. Functional ability and status Nutritional status Physical activity Advanced directives List of other physicians Hospitalizations, surgeries, and ER visits in previous 12 months Vitals Screenings to include cognitive, depression, and falls Referrals and appointments  In addition, I have reviewed and discussed with patient certain preventive protocols, quality metrics,  and best practice recommendations. A written personalized care plan for preventive services as well as general preventive health recommendations were provided to patient.     Tora Kindred, CMA   06/22/2023   After Visit Summary: (MyChart) Due to this being a telephonic visit, the after visit summary with patients personalized plan was offered to patient via MyChart   Nurse Notes:  6 CIT Score - 4 Declined DM & Nutrition Education Declined MMG and DEXA Having some hearing loss, but declined referral to ENT Gave phone # to call and schedule LDCT

## 2023-08-02 ENCOUNTER — Ambulatory Visit: Payer: Medicare Other | Admitting: Cardiovascular Disease

## 2023-08-15 ENCOUNTER — Other Ambulatory Visit: Payer: Self-pay | Admitting: Family Medicine

## 2023-08-16 ENCOUNTER — Ambulatory Visit: Payer: Medicare Other | Admitting: Family Medicine

## 2023-08-17 NOTE — Telephone Encounter (Signed)
Requested medications are due for refill today.  yes  Requested medications are on the active medications list.  yes  Last refill. 05/17/2023 #90 1 rf  Future visit scheduled.   yes  Notes to clinic.  Refill not delegated.    Requested Prescriptions  Pending Prescriptions Disp Refills   QUEtiapine (SEROQUEL XR) 50 MG TB24 24 hr tablet [Pharmacy Med Name: QUEtiapine Fumarate ER 50 MG Oral Tablet Extended Release 24 Hour] 90 tablet 0    Sig: TAKE 2 TABLETS BY MOUTH AT BEDTIME     Not Delegated - Psychiatry:  Antipsychotics - Second Generation (Atypical) - quetiapine Failed - 08/17/2023  8:07 AM      Failed - This refill cannot be delegated      Failed - Lipid Panel in normal range within the last 12 months    Cholesterol, Total  Date Value Ref Range Status  05/17/2023 161 100 - 199 mg/dL Final   Cholesterol  Date Value Ref Range Status  01/16/2014 258 (H) 0 - 200 mg/dL Final   Cholesterol Piccolo, Waived  Date Value Ref Range Status  02/04/2016 164 <200 mg/dL Final    Comment:                            Desirable                <200                         Borderline High      200- 239                         High                     >239    Ldl Cholesterol, Calc  Date Value Ref Range Status  01/16/2014 156 (H) 0 - 100 mg/dL Final   LDL Chol Calc (NIH)  Date Value Ref Range Status  05/17/2023 86 0 - 99 mg/dL Final   HDL Cholesterol  Date Value Ref Range Status  01/16/2014 35 (L) 40 - 60 mg/dL Final   HDL  Date Value Ref Range Status  05/17/2023 49 >39 mg/dL Final   Triglycerides  Date Value Ref Range Status  05/17/2023 153 (H) 0 - 149 mg/dL Final  14/78/2956 213 (H) 0 - 200 mg/dL Final   Triglycerides Piccolo,Waived  Date Value Ref Range Status  02/04/2016 258 (H) <150 mg/dL Final    Comment:                            Normal                   <150                         Borderline High     150 - 199                         High                200 -  499                         Very High                >  499          Passed - TSH in normal range and within 360 days    TSH  Date Value Ref Range Status  05/17/2023 2.530 0.450 - 4.500 uIU/mL Final         Passed - Completed PHQ-2 or PHQ-9 in the last 360 days      Passed - Last BP in normal range    BP Readings from Last 1 Encounters:  05/17/23 128/66         Passed - Last Heart Rate in normal range    Pulse Readings from Last 1 Encounters:  05/17/23 68         Passed - Valid encounter within last 6 months    Recent Outpatient Visits           3 months ago Aortic atherosclerosis (HCC)   [facility] [place], [name], DO   11 months ago Type 2 diabetes mellitus with stage 3 chronic kidney disease, without long-term current use of insulin, unspecified whether stage 3a or 3b CKD (HCC)   [place] [facility] [place], [name], DO   1 year ago Hypertensive renal disease   [facility] [place], [name], DO   1 year ago Type 2 diabetes mellitus with stage 3 chronic kidney disease, without long-term current use of insulin, unspecified whether stage 3a or 3b CKD (HCC)   [facility] [place], [name], DO   1 year ago Orthostatic hypotension   [facility] [place], [place], DO       Future Appointments             In 3 weeks Laural [place], Oralia Rud, DO [facility]            Passed - CBC within normal limits and completed in the last 12 months    WBC  Date Value Ref Range Status  05/17/2023 5.8 3.4 - 10.8 x10E3/uL Final  03/08/2020 4.6 4.0 - 10.5 K/uL Final   RBC  Date Value Ref Range Status  05/17/2023 4.75 3.77 - 5.28 x10E6/uL Final  03/08/2020 4.04 3.87 - 5.11 MIL/uL Final   Hemoglobin  Date Value Ref Range Status  05/17/2023 13.2 11.1 - 15.9 g/dL Final   Hematocrit  Date Value Ref Range Status  05/17/2023 41.3 34.0 - 46.6 %  Final   MCHC  Date Value Ref Range Status  05/17/2023 32.0 31.5 - 35.7 g/dL Final  44/08/270 53.6 30.0 - 36.0 g/dL Final   [facility]  Date Value Ref Range Status  05/17/2023 27.8 26.6 - 33.0 pg Final  03/08/2020 27.5 26.0 - 34.0 pg Final   MCV  Date Value Ref Range Status  05/17/2023 87 79 - 97 fL Final  01/16/2014 84 80 - 100 fL Final   No results found for: "PLTCOUNTKUC", "LABPLAT", "POCPLA" RDW  Date Value Ref Range Status  05/17/2023 14.1 11.7 - 15.4 % Final  01/16/2014 14.1 11.5 - 14.5 % Final         Passed - CMP within normal limits and completed in the last 12 months    Albumin  Date Value Ref Range Status  05/17/2023 4.2 3.8 - 4.8 g/dL Final  64/40/3474 3.7 3.4 - 5.0 g/dL Final   Alkaline Phosphatase  Date Value Ref Range Status  05/17/2023 115 44 - 121 IU/L Final  01/15/2014 125 (H) Unit/L Final    Comment:  45-117 NOTE: New Reference Range 07/21/13    ALT  Date Value Ref Range Status  05/17/2023 14 0 - 32 IU/L Final   SGPT (ALT)  Date Value Ref Range Status  01/15/2014 28 12 - 78 U/L Final   AST  Date Value Ref Range Status  05/17/2023 17 0 - 40 IU/L Final   SGOT(AST)  Date Value Ref Range Status  01/15/2014 27 15 - 37 Unit/L Final   BUN  Date Value Ref Range Status  05/17/2023 13 8 - 27 mg/dL Final  16/06/9603 11 7 - 18 mg/dL Final   Calcium  Date Value Ref Range Status  05/17/2023 8.9 8.7 - 10.3 mg/dL Final   Calcium, Total  Date Value Ref Range Status  01/15/2014 9.2 8.5 - 10.1 mg/dL Final   CO2  Date Value Ref Range Status  05/17/2023 20 20 - 29 mmol/L Final   Co2  Date Value Ref Range Status  01/15/2014 26 21 - 32 mmol/L Final   Bicarbonate  Date Value Ref Range Status  12/03/2019 29.0 (H) 20.0 - 28.0 mmol/L Final   Creatinine  Date Value Ref Range Status  01/15/2014 0.52 (L) 0.60 - 1.30 mg/dL Final   Creatinine, Ser  Date Value Ref Range Status  05/17/2023 1.08 (H) 0.57 - 1.00 mg/dL Final   Glucose  Date Value  Ref Range Status  05/17/2023 83 70 - 99 mg/dL Final  54/05/8118 147 (H) 65 - 99 mg/dL Final   Glucose, Bld  Date Value Ref Range Status  03/08/2020 118 (H) 70 - 99 mg/dL Final    Comment:    Glucose reference range applies only to samples taken after fasting for at least 8 hours.   Glucose-Capillary  Date Value Ref Range Status  09/10/2021 83 70 - 99 mg/dL Final    Comment:    Glucose reference range applies only to samples taken after fasting for at least 8 hours.   Potassium  Date Value Ref Range Status  05/17/2023 4.7 3.5 - 5.2 mmol/L Final  01/15/2014 4.2 3.5 - 5.1 mmol/L Final   Sodium  Date Value Ref Range Status  05/17/2023 142 134 - 144 mmol/L Final  01/15/2014 134 (L) 136 - 145 mmol/L Final   Bilirubin,Total  Date Value Ref Range Status  01/15/2014 0.4 0.2 - 1.0 mg/dL Final   Bilirubin Total  Date Value Ref Range Status  05/17/2023 0.3 0.0 - 1.2 mg/dL Final   Bilirubin, Direct  Date Value Ref Range Status  09/08/2018 0.3 (H) 0.0 - 0.2 mg/dL Final   Indirect Bilirubin  Date Value Ref Range Status  09/08/2018 0.6 0.3 - 0.9 mg/dL Final    Comment:    Performed at [facility], [address] [place], [place] [address]   Protein, ur  Date Value Ref Range Status  12/03/2019 100 (A) NEGATIVE mg/dL Final   Protein,UA  Date Value Ref Range Status  06/23/2021 Trace (A) Negative/Trace Final   Total Protein  Date Value Ref Range Status  05/17/2023 6.0 6.0 - 8.5 g/dL Final  21/30/8657 7.3 6.4 - 8.2 g/dL Final   EGFR (African American)  Date Value Ref Range Status  01/15/2014 >60  Final   GFR calc Af Amer  Date Value Ref Range Status  09/24/2020 83 >59 mL/min/1.73 Final    Comment:    **In accordance with recommendations from the NKF-ASN Task force,**   [facility] is in the process of updating its eGFR calculation to the   2021 CKD-EPI  creatinine equation that estimates kidney function   without a race variable.    eGFR  Date Value Ref Range  Status  05/17/2023 54 (L) >59 mL/min/1.73 Final   EGFR (Non-African Amer.)  Date Value Ref Range Status  01/15/2014 >60  Final    Comment:    eGFR values <73mL/min/1.73 m2 may be an indication of chronic kidney disease (CKD). Calculated eGFR is useful in patients with stable renal function. The eGFR calculation will not be reliable in acutely ill patients when serum creatinine is changing rapidly. It is not useful in  patients on dialysis. The eGFR calculation may not be applicable to patients at the low and high extremes of body sizes, pregnant women, and vegetarians.    GFR calc non Af Amer  Date Value Ref Range Status  09/24/2020 72 >59 mL/min/1.73 Final

## 2023-09-07 ENCOUNTER — Telehealth: Payer: Self-pay | Admitting: *Deleted

## 2023-09-07 ENCOUNTER — Other Ambulatory Visit: Payer: Self-pay | Admitting: *Deleted

## 2023-09-07 DIAGNOSIS — F17210 Nicotine dependence, cigarettes, uncomplicated: Secondary | ICD-10-CM

## 2023-09-07 DIAGNOSIS — Z87891 Personal history of nicotine dependence: Secondary | ICD-10-CM

## 2023-09-07 DIAGNOSIS — Z122 Encounter for screening for malignant neoplasm of respiratory organs: Secondary | ICD-10-CM

## 2023-09-07 NOTE — Telephone Encounter (Signed)
 Lung Cancer Screening Narrative/Criteria Questionnaire (Cigarette Smokers Only- No Cigars/Pipes/vapes)   Sarah White     SDMV:09/15/23 10:00 - Sarah White  02-08-1946              LDCT: 09/15/23 4:30 - [facility]    78 y.o.   Phone: [phone]  Lung Screening Narrative   Before calling, confirm age (50-77 yrs Medicare / 50-80 yrs Private pay insurance)  Insurance information: AARP MCR   I am calling at the request of Duwaine Louder (referring provider) to schedule you for a lung screening.  Did your provider discuss this with you? Yes   This screening involves an initial meeting with our NP, who is the Nurse Navigator for the program.  It is called a shared decision making visit.  The initial meeting is required by insurance and Medicare to make sure you understand the program.  This appointment takes about 20 minutes to complete.  After you have spoken with the provider, we will schedule you for your screening scan.  This scan takes about 5-10 minutes to complete.  You can eat and drink normally before and after the scan.    I am going to ask you a few questions to make sure you meet the criteria to participate in the program.    Are you a current or former smoker? Current Age began smoking: 12   If you are a former smoker, what year did you quit smoking? (must be within 15 yrs)    To calculate your smoking history, I need an accurate estimate of how many packs of cigarettes you smoked per day and   for how many years.    Years smoking 65 x Packs per day 1 = Pack years 9   (at least 20 pack yrs)   (Make sure they understand that we need to know how much they have smoked in the past, not just the number of PPD they are smoking now)  Do you have a personal history of cancer?  Yes - (type and when diagnosed - 5 yrs cancer free) Uterine - 46 yrs ago Hysterectomy    Do you have a family history of cancer? Yes  (cancer type and and relative) Brother - Colon   Are you having any of the  following symptoms?  Coughing up blood?  No  Weight loss of 15 lbs or more in the last 6 months without trying / you cannot explain  No  It looks like you meet all criteria.  When would be a good time for us  to schedule you for this screening?   Additional information: N/A

## 2023-09-09 ENCOUNTER — Ambulatory Visit: Payer: Medicare Other | Admitting: Family Medicine

## 2023-09-10 ENCOUNTER — Ambulatory Visit: Payer: Medicare Other | Admitting: Family Medicine

## 2023-09-13 ENCOUNTER — Other Ambulatory Visit: Payer: Self-pay | Admitting: Family Medicine

## 2023-09-14 NOTE — Telephone Encounter (Signed)
 Requested medication (s) are due for refill today- yes  Requested medication (s) are on the active medication list -yes  Future visit scheduled -yes  Last refill: 08/17/23 #60  Notes to clinic: non delegated Rx  Requested Prescriptions  Pending Prescriptions Disp Refills   QUEtiapine  (SEROQUEL  XR) 50 MG TB24 24 hr tablet [Pharmacy Med Name: QUEtiapine  Fumarate ER 50 MG Oral Tablet Extended Release 24 Hour] 60 tablet 0    Sig: TAKE 2 TABLETS BY MOUTH AT BEDTIME     Not Delegated - Psychiatry:  Antipsychotics - Second Generation (Atypical) - quetiapine  Failed - 09/14/2023 12:37 PM      Failed - This refill cannot be delegated      Failed - Lipid Panel in normal range within the last 12 months    Cholesterol, Total  Date Value Ref Range Status  05/17/2023 161 100 - 199 mg/dL Final   Cholesterol  Date Value Ref Range Status  01/16/2014 258 (H) 0 - 200 mg/dL Final   Cholesterol Piccolo, Waived  Date Value Ref Range Status  02/04/2016 164 <200 mg/dL Final    Comment:                            Desirable                <200                         Borderline High      200- 239                         High                     >239    Ldl Cholesterol, Calc  Date Value Ref Range Status  01/16/2014 156 (H) 0 - 100 mg/dL Final   LDL Chol Calc (NIH)  Date Value Ref Range Status  05/17/2023 86 0 - 99 mg/dL Final   HDL Cholesterol  Date Value Ref Range Status  01/16/2014 35 (L) 40 - 60 mg/dL Final   HDL  Date Value Ref Range Status  05/17/2023 49 >39 mg/dL Final   Triglycerides  Date Value Ref Range Status  05/17/2023 153 (H) 0 - 149 mg/dL Final  94/80/7984 662 (H) 0 - 200 mg/dL Final   Triglycerides Piccolo,Waived  Date Value Ref Range Status  02/04/2016 258 (H) <150 mg/dL Final    Comment:                            Normal                   <150                         Borderline High     150 - 199                         High                200 - 499                          Very High                >  499          Passed - TSH in normal range and within 360 days    TSH  Date Value Ref Range Status  05/17/2023 2.530 0.450 - 4.500 uIU/mL Final         Passed - Completed PHQ-2 or PHQ-9 in the last 360 days      Passed - Last BP in normal range    BP Readings from Last 1 Encounters:  05/17/23 128/66         Passed - Last Heart Rate in normal range    Pulse Readings from Last 1 Encounters:  05/17/23 68         Passed - Valid encounter within last 6 months    Recent Outpatient Visits           4 months ago Aortic atherosclerosis (HCC)   [place] [facility] [place], [name], DO   1 year ago Type 2 diabetes mellitus with stage 3 chronic kidney disease, without long-term current use of insulin , unspecified whether stage 3a or 3b CKD (HCC)   [place] [facility] [place], [name], DO   1 year ago Hypertensive renal disease   [place] [facility] [place], [name], DO   1 year ago Type 2 diabetes mellitus with stage 3 chronic kidney disease, without long-term current use of insulin , unspecified whether stage 3a or 3b CKD (HCC)   [place] [facility] [place], [name], DO   1 year ago Orthostatic hypotension   [place] [facility] [place], [place], DO       Future Appointments             In 6 days Vicci, Duwaine SQUIBB, DO [place] [facility]            Passed - CBC within normal limits and completed in the last 12 months    WBC  Date Value Ref Range Status  05/17/2023 5.8 3.4 - 10.8 x10E3/uL Final  03/08/2020 4.6 4.0 - 10.5 K/uL Final   RBC  Date Value Ref Range Status  05/17/2023 4.75 3.77 - 5.28 x10E6/uL Final  03/08/2020 4.04 3.87 - 5.11 MIL/uL Final   Hemoglobin  Date Value Ref Range Status  05/17/2023 13.2 11.1 - 15.9 g/dL Final   Hematocrit  Date Value Ref Range Status  05/17/2023 41.3 34.0 - 46.6 % Final   MCHC  Date  Value Ref Range Status  05/17/2023 32.0 31.5 - 35.7 g/dL Final  92/90/7978 68.5 30.0 - 36.0 g/dL Final   [facility]  Date Value Ref Range Status  05/17/2023 27.8 26.6 - 33.0 pg Final  03/08/2020 27.5 26.0 - 34.0 pg Final   MCV  Date Value Ref Range Status  05/17/2023 87 79 - 97 fL Final  01/16/2014 84 80 - 100 fL Final   No results found for: PLTCOUNTKUC, LABPLAT, POCPLA RDW  Date Value Ref Range Status  05/17/2023 14.1 11.7 - 15.4 % Final  01/16/2014 14.1 11.5 - 14.5 % Final         Passed - CMP within normal limits and completed in the last 12 months    Albumin  Date Value Ref Range Status  05/17/2023 4.2 3.8 - 4.8 g/dL Final  94/81/7984 3.7 3.4 - 5.0 g/dL Final   Alkaline Phosphatase  Date Value Ref Range Status  05/17/2023 115 44 - 121 IU/L Final  01/15/2014 125 (H) Unit/L Final    Comment:  45-117 NOTE: New Reference Range 07/21/13    ALT  Date Value Ref Range Status  05/17/2023 14 0 - 32 IU/L Final   SGPT (ALT)  Date Value Ref Range Status  01/15/2014 28 12 - 78 U/L Final   AST  Date Value Ref Range Status  05/17/2023 17 0 - 40 IU/L Final   SGOT(AST)  Date Value Ref Range Status  01/15/2014 27 15 - 37 Unit/L Final   BUN  Date Value Ref Range Status  05/17/2023 13 8 - 27 mg/dL Final  94/81/7984 11 7 - 18 mg/dL Final   Calcium   Date Value Ref Range Status  05/17/2023 8.9 8.7 - 10.3 mg/dL Final   Calcium , Total  Date Value Ref Range Status  01/15/2014 9.2 8.5 - 10.1 mg/dL Final   CO2  Date Value Ref Range Status  05/17/2023 20 20 - 29 mmol/L Final   Co2  Date Value Ref Range Status  01/15/2014 26 21 - 32 mmol/L Final   Bicarbonate  Date Value Ref Range Status  12/03/2019 29.0 (H) 20.0 - 28.0 mmol/L Final   Creatinine  Date Value Ref Range Status  01/15/2014 0.52 (L) 0.60 - 1.30 mg/dL Final   Creatinine, Ser  Date Value Ref Range Status  05/17/2023 1.08 (H) 0.57 - 1.00 mg/dL Final   Glucose  Date Value Ref Range Status   05/17/2023 83 70 - 99 mg/dL Final  94/81/7984 729 (H) 65 - 99 mg/dL Final   Glucose, Bld  Date Value Ref Range Status  03/08/2020 118 (H) 70 - 99 mg/dL Final    Comment:    Glucose reference range applies only to samples taken after fasting for at least 8 hours.   Glucose-Capillary  Date Value Ref Range Status  09/10/2021 83 70 - 99 mg/dL Final    Comment:    Glucose reference range applies only to samples taken after fasting for at least 8 hours.   Potassium  Date Value Ref Range Status  05/17/2023 4.7 3.5 - 5.2 mmol/L Final  01/15/2014 4.2 3.5 - 5.1 mmol/L Final   Sodium  Date Value Ref Range Status  05/17/2023 142 134 - 144 mmol/L Final  01/15/2014 134 (L) 136 - 145 mmol/L Final   Bilirubin,Total  Date Value Ref Range Status  01/15/2014 0.4 0.2 - 1.0 mg/dL Final   Bilirubin Total  Date Value Ref Range Status  05/17/2023 0.3 0.0 - 1.2 mg/dL Final   Bilirubin, Direct  Date Value Ref Range Status  09/08/2018 0.3 (H) 0.0 - 0.2 mg/dL Final   Indirect Bilirubin  Date Value Ref Range Status  09/08/2018 0.6 0.3 - 0.9 mg/dL Final    Comment:    Performed at [facility], [address] [place], [place] [address]   Protein, ur  Date Value Ref Range Status  12/03/2019 100 (A) NEGATIVE mg/dL Final   Protein,UA  Date Value Ref Range Status  06/23/2021 Trace (A) Negative/Trace Final   Total Protein  Date Value Ref Range Status  05/17/2023 6.0 6.0 - 8.5 g/dL Final  94/81/7984 7.3 6.4 - 8.2 g/dL Final   EGFR (African American)  Date Value Ref Range Status  01/15/2014 >60  Final   GFR calc Af Amer  Date Value Ref Range Status  09/24/2020 83 >59 mL/min/1.73 Final    Comment:    **In accordance with recommendations from the NKF-ASN Task force,**   [facility] is in the process of updating its eGFR calculation to the   2021 CKD-EPI  creatinine equation that estimates kidney function   without a race variable.    eGFR  Date Value Ref Range Status   05/17/2023 54 (L) >59 mL/min/1.73 Final   EGFR (Non-African Amer.)  Date Value Ref Range Status  01/15/2014 >60  Final    Comment:    eGFR values <44mL/min/1.73 m2 may be an indication of chronic kidney disease (CKD). Calculated eGFR is useful in patients with stable renal function. The eGFR calculation will not be reliable in acutely ill patients when serum creatinine is changing rapidly. It is not useful in  patients on dialysis. The eGFR calculation may not be applicable to patients at the low and high extremes of body sizes, pregnant women, and vegetarians.    GFR calc non Af Amer  Date Value Ref Range Status  09/24/2020 72 >59 mL/min/1.73 Final            Requested Prescriptions  Pending Prescriptions Disp Refills   QUEtiapine  (SEROQUEL  XR) 50 MG TB24 24 hr tablet [Pharmacy Med Name: QUEtiapine  Fumarate ER 50 MG Oral Tablet Extended Release 24 Hour] 60 tablet 0    Sig: TAKE 2 TABLETS BY MOUTH AT BEDTIME     Not Delegated - Psychiatry:  Antipsychotics - Second Generation (Atypical) - quetiapine  Failed - 09/14/2023 12:37 PM      Failed - This refill cannot be delegated      Failed - Lipid Panel in normal range within the last 12 months    Cholesterol, Total  Date Value Ref Range Status  05/17/2023 161 100 - 199 mg/dL Final   Cholesterol  Date Value Ref Range Status  01/16/2014 258 (H) 0 - 200 mg/dL Final   Cholesterol Piccolo, Waived  Date Value Ref Range Status  02/04/2016 164 <200 mg/dL Final    Comment:                            Desirable                <200                         Borderline High      200- 239                         High                     >239    Ldl Cholesterol, Calc  Date Value Ref Range Status  01/16/2014 156 (H) 0 - 100 mg/dL Final   LDL Chol Calc (NIH)  Date Value Ref Range Status  05/17/2023 86 0 - 99 mg/dL Final   HDL Cholesterol  Date Value Ref Range Status  01/16/2014 35 (L) 40 - 60 mg/dL Final   HDL  Date Value  Ref Range Status  05/17/2023 49 >39 mg/dL Final   Triglycerides  Date Value Ref Range Status  05/17/2023 153 (H) 0 - 149 mg/dL Final  94/80/7984 662 (H) 0 - 200 mg/dL Final   Triglycerides Piccolo,Waived  Date Value Ref Range Status  02/04/2016 258 (H) <150 mg/dL Final    Comment:                            Normal                   <  150                         Borderline High     150 - 199                         High                200 - 499                         Very High                >499          Passed - TSH in normal range and within 360 days    TSH  Date Value Ref Range Status  05/17/2023 2.530 0.450 - 4.500 uIU/mL Final         Passed - Completed PHQ-2 or PHQ-9 in the last 360 days      Passed - Last BP in normal range    BP Readings from Last 1 Encounters:  05/17/23 128/66         Passed - Last Heart Rate in normal range    Pulse Readings from Last 1 Encounters:  05/17/23 68         Passed - Valid encounter within last 6 months    Recent Outpatient Visits           4 months ago Aortic atherosclerosis (HCC)   [facility] [place], [name], DO   1 year ago Type 2 diabetes mellitus with stage 3 chronic kidney disease, without long-term current use of insulin , unspecified whether stage 3a or 3b CKD (HCC)   [facility] [place], [name], DO   1 year ago Hypertensive renal disease   [place] [facility] [place], [name], DO   1 year ago Type 2 diabetes mellitus with stage 3 chronic kidney disease, without long-term current use of insulin , unspecified whether stage 3a or 3b CKD (HCC)   [place] [facility] [place], [name], DO   1 year ago Orthostatic hypotension   [place] [facility] [place], [place], DO       Future Appointments             In 6 days Vicci, Duwaine SQUIBB, DO [place] [facility]            Passed - CBC within normal  limits and completed in the last 12 months    WBC  Date Value Ref Range Status  05/17/2023 5.8 3.4 - 10.8 x10E3/uL Final  03/08/2020 4.6 4.0 - 10.5 K/uL Final   RBC  Date Value Ref Range Status  05/17/2023 4.75 3.77 - 5.28 x10E6/uL Final  03/08/2020 4.04 3.87 - 5.11 MIL/uL Final   Hemoglobin  Date Value Ref Range Status  05/17/2023 13.2 11.1 - 15.9 g/dL Final   Hematocrit  Date Value Ref Range Status  05/17/2023 41.3 34.0 - 46.6 % Final   MCHC  Date Value Ref Range Status  05/17/2023 32.0 31.5 - 35.7 g/dL Final  92/90/7978 68.5 30.0 - 36.0 g/dL Final   [facility]  Date Value Ref Range Status  05/17/2023 27.8 26.6 - 33.0 pg Final  03/08/2020 27.5 26.0 - 34.0 pg Final   MCV  Date Value Ref Range Status  05/17/2023 87 79 -  97 fL Final  01/16/2014 84 80 - 100 fL Final   No results found for: PLTCOUNTKUC, LABPLAT, POCPLA RDW  Date Value Ref Range Status  05/17/2023 14.1 11.7 - 15.4 % Final  01/16/2014 14.1 11.5 - 14.5 % Final         Passed - CMP within normal limits and completed in the last 12 months    Albumin  Date Value Ref Range Status  05/17/2023 4.2 3.8 - 4.8 g/dL Final  94/81/7984 3.7 3.4 - 5.0 g/dL Final   Alkaline Phosphatase  Date Value Ref Range Status  05/17/2023 115 44 - 121 IU/L Final  01/15/2014 125 (H) Unit/L Final    Comment:    45-117 NOTE: New Reference Range 07/21/13    ALT  Date Value Ref Range Status  05/17/2023 14 0 - 32 IU/L Final   SGPT (ALT)  Date Value Ref Range Status  01/15/2014 28 12 - 78 U/L Final   AST  Date Value Ref Range Status  05/17/2023 17 0 - 40 IU/L Final   SGOT(AST)  Date Value Ref Range Status  01/15/2014 27 15 - 37 Unit/L Final   BUN  Date Value Ref Range Status  05/17/2023 13 8 - 27 mg/dL Final  94/81/7984 11 7 - 18 mg/dL Final   Calcium   Date Value Ref Range Status  05/17/2023 8.9 8.7 - 10.3 mg/dL Final   Calcium , Total  Date Value Ref Range Status  01/15/2014 9.2 8.5 - 10.1 mg/dL Final    CO2  Date Value Ref Range Status  05/17/2023 20 20 - 29 mmol/L Final   Co2  Date Value Ref Range Status  01/15/2014 26 21 - 32 mmol/L Final   Bicarbonate  Date Value Ref Range Status  12/03/2019 29.0 (H) 20.0 - 28.0 mmol/L Final   Creatinine  Date Value Ref Range Status  01/15/2014 0.52 (L) 0.60 - 1.30 mg/dL Final   Creatinine, Ser  Date Value Ref Range Status  05/17/2023 1.08 (H) 0.57 - 1.00 mg/dL Final   Glucose  Date Value Ref Range Status  05/17/2023 83 70 - 99 mg/dL Final  94/81/7984 729 (H) 65 - 99 mg/dL Final   Glucose, Bld  Date Value Ref Range Status  03/08/2020 118 (H) 70 - 99 mg/dL Final    Comment:    Glucose reference range applies only to samples taken after fasting for at least 8 hours.   Glucose-Capillary  Date Value Ref Range Status  09/10/2021 83 70 - 99 mg/dL Final    Comment:    Glucose reference range applies only to samples taken after fasting for at least 8 hours.   Potassium  Date Value Ref Range Status  05/17/2023 4.7 3.5 - 5.2 mmol/L Final  01/15/2014 4.2 3.5 - 5.1 mmol/L Final   Sodium  Date Value Ref Range Status  05/17/2023 142 134 - 144 mmol/L Final  01/15/2014 134 (L) 136 - 145 mmol/L Final   Bilirubin,Total  Date Value Ref Range Status  01/15/2014 0.4 0.2 - 1.0 mg/dL Final   Bilirubin Total  Date Value Ref Range Status  05/17/2023 0.3 0.0 - 1.2 mg/dL Final   Bilirubin, Direct  Date Value Ref Range Status  09/08/2018 0.3 (H) 0.0 - 0.2 mg/dL Final   Indirect Bilirubin  Date Value Ref Range Status  09/08/2018 0.6 0.3 - 0.9 mg/dL Final    Comment:    Performed at [facility], [address] [place], [place] [address]   Protein, ur  Date Value Ref Range Status  12/03/2019 100 (A) NEGATIVE mg/dL Final   Protein,UA  Date Value Ref Range Status  06/23/2021 Trace (A) Negative/Trace Final   Total Protein  Date Value Ref Range Status  05/17/2023 6.0 6.0 - 8.5 g/dL Final  94/81/7984 7.3 6.4 - 8.2 g/dL  Final   EGFR (African American)  Date Value Ref Range Status  01/15/2014 >60  Final   GFR calc Af Amer  Date Value Ref Range Status  09/24/2020 83 >59 mL/min/1.73 Final    Comment:    **In accordance with recommendations from the NKF-ASN Task force,**   [facility] is in the process of updating its eGFR calculation to the   2021 CKD-EPI creatinine equation that estimates kidney function   without a race variable.    eGFR  Date Value Ref Range Status  05/17/2023 54 (L) >59 mL/min/1.73 Final   EGFR (Non-African Amer.)  Date Value Ref Range Status  01/15/2014 >60  Final    Comment:    eGFR values <43mL/min/1.73 m2 may be an indication of chronic kidney disease (CKD). Calculated eGFR is useful in patients with stable renal function. The eGFR calculation will not be reliable in acutely ill patients when serum creatinine is changing rapidly. It is not useful in  patients on dialysis. The eGFR calculation may not be applicable to patients at the low and high extremes of body sizes, pregnant women, and vegetarians.    GFR calc non Af Amer  Date Value Ref Range Status  09/24/2020 72 >59 mL/min/1.73 Final

## 2023-09-15 ENCOUNTER — Ambulatory Visit: Payer: Medicare Other | Admitting: Adult Health

## 2023-09-15 ENCOUNTER — Ambulatory Visit
Admission: RE | Admit: 2023-09-15 | Discharge: 2023-09-15 | Disposition: A | Discharge status: Home / Self Care | Payer: Medicare Other | Source: Ambulatory Visit | Attending: Acute Care | Admitting: Acute Care

## 2023-09-15 ENCOUNTER — Encounter: Payer: Self-pay | Admitting: Adult Health

## 2023-09-15 DIAGNOSIS — R918 Other nonspecific abnormal finding of lung field: Secondary | ICD-10-CM | POA: Diagnosis not present

## 2023-09-15 DIAGNOSIS — I2510 Atherosclerotic heart disease of native coronary artery without angina pectoris: Secondary | ICD-10-CM | POA: Diagnosis not present

## 2023-09-15 DIAGNOSIS — Z122 Encounter for screening for malignant neoplasm of respiratory organs: Secondary | ICD-10-CM | POA: Diagnosis not present

## 2023-09-15 DIAGNOSIS — F17210 Nicotine dependence, cigarettes, uncomplicated: Secondary | ICD-10-CM | POA: Insufficient documentation

## 2023-09-15 DIAGNOSIS — I700 Atherosclerosis of aorta: Secondary | ICD-10-CM | POA: Diagnosis not present

## 2023-09-15 DIAGNOSIS — Z87891 Personal history of nicotine dependence: Secondary | ICD-10-CM

## 2023-09-15 DIAGNOSIS — J439 Emphysema, unspecified: Secondary | ICD-10-CM | POA: Diagnosis not present

## 2023-09-15 NOTE — Telephone Encounter (Signed)
 Does she have enough to make it to her appt in 5 days?

## 2023-09-15 NOTE — Patient Instructions (Signed)

## 2023-09-15 NOTE — Progress Notes (Signed)
  Virtual Visit via Telephone Note  I connected with Sarah White , 09/15/23 10:14 AM by a telemedicine application and verified that I am speaking with the correct person using two identifiers.  Location: Patient: home Provider: home   I discussed the limitations of evaluation and management by telemedicine and the availability of in person appointments. The patient expressed understanding and agreed to proceed.   Shared Decision Making Visit Lung Cancer Screening Program [phone])   Eligibility: 78 y.o. Pack Years Smoking History Calculation = 65 pack years (# packs/per year x # years smoked) Recent History of coughing up blood  no Unexplained weight loss? no ( >Than 15 pounds within the last 6 months ) Prior History Lung / other cancer no (Diagnosis within the last 5 years already requiring surveillance chest CT Scans). Smoking Status Current Smoker   Visit Components: Discussion included one or more decision making aids. YES Discussion included risk/benefits of screening. YES Discussion included potential follow up diagnostic testing for abnormal scans. YES Discussion included meaning and risk of over diagnosis. YES Discussion included meaning and risk of False Positives. YES Discussion included meaning of total radiation exposure. YES  Counseling Included: Importance of adherence to annual lung cancer LDCT screening. YES Impact of comorbidities on ability to participate in the program. YES Ability and willingness to under diagnostic treatment. YES  Smoking Cessation Counseling: Current Smokers:  Discussed importance of smoking cessation. yes Information about tobacco cessation classes and interventions provided to patient. yes Patient provided with "ticket" for LDCT Scan. yes Symptomatic Patient. NO Diagnosis Code: Tobacco Use Z72.0 Asymptomatic Patient yes  Counseling (Intermediate counseling: > three minutes counseling) U0454   Z12.2-Screening of respiratory  organs Z87.891-Personal history of nicotine  dependence   [name] 09/15/23

## 2023-09-15 NOTE — Telephone Encounter (Signed)
 Spoke with patient to clarify if she has enough medication to last her until her scheduled appointment. Patient says she only has 3 days of Trazodone  and 2 days of Seroquel  left. Patient says she would be out of those before her scheduled appointment. Please advise?

## 2023-09-16 MED ORDER — TRAZODONE HCL 50 MG PO TABS: 50.0000 mg | ORAL_TABLET | Freq: Every evening | ORAL | 0 refills | Status: DC | PRN

## 2023-09-20 ENCOUNTER — Ambulatory Visit: Payer: Medicare Other | Admitting: Family Medicine

## 2023-09-20 ENCOUNTER — Encounter: Payer: Self-pay | Admitting: Family Medicine

## 2023-09-20 ENCOUNTER — Telehealth: Payer: Self-pay | Admitting: Acute Care

## 2023-09-20 VITALS — BP 143/73 | HR 80 | Temp 98.6°F | Wt 176.0 lb

## 2023-09-20 DIAGNOSIS — I129 Hypertensive chronic kidney disease with stage 1 through stage 4 chronic kidney disease, or unspecified chronic kidney disease: Secondary | ICD-10-CM | POA: Diagnosis not present

## 2023-09-20 DIAGNOSIS — I5022 Chronic systolic (congestive) heart failure: Secondary | ICD-10-CM | POA: Diagnosis not present

## 2023-09-20 DIAGNOSIS — I200 Unstable angina: Secondary | ICD-10-CM | POA: Diagnosis not present

## 2023-09-20 DIAGNOSIS — I2510 Atherosclerotic heart disease of native coronary artery without angina pectoris: Secondary | ICD-10-CM

## 2023-09-20 DIAGNOSIS — E1122 Type 2 diabetes mellitus with diabetic chronic kidney disease: Secondary | ICD-10-CM | POA: Diagnosis not present

## 2023-09-20 DIAGNOSIS — I700 Atherosclerosis of aorta: Secondary | ICD-10-CM

## 2023-09-20 DIAGNOSIS — N183 Chronic kidney disease, stage 3 unspecified: Secondary | ICD-10-CM | POA: Diagnosis not present

## 2023-09-20 LAB — MICROALBUMIN, URINE WAIVED
Creatinine, Urine Waived: 300 mg/dL (ref 10–300)
Microalb, Ur Waived: 80 mg/L — ABNORMAL HIGH (ref 0–19)

## 2023-09-20 LAB — BAYER DCA HB A1C WAIVED: HB A1C (BAYER DCA - WAIVED): 5.9 % — ABNORMAL HIGH (ref 4.8–5.6)

## 2023-09-20 MED ORDER — TRAZODONE HCL 50 MG PO TABS: 50.0000 mg | ORAL_TABLET | Freq: Every evening | ORAL | 0 refills | Status: DC | PRN

## 2023-09-20 MED ORDER — PANTOPRAZOLE SODIUM 40 MG PO TBEC: 40.0000 mg | DELAYED_RELEASE_TABLET | Freq: Two times a day (BID) | ORAL | 0 refills | Status: DC

## 2023-09-20 MED ORDER — CLOPIDOGREL BISULFATE 75 MG PO TABS: 75.0000 mg | ORAL_TABLET | Freq: Every day | ORAL | 0 refills | Status: DC

## 2023-09-20 MED ORDER — ALBUTEROL SULFATE HFA 108 (90 BASE) MCG/ACT IN AERS: 2.0000 | INHALATION_SPRAY | Freq: Four times a day (QID) | RESPIRATORY_TRACT | 0 refills | Status: DC | PRN

## 2023-09-20 MED ORDER — FUROSEMIDE 40 MG PO TABS: 40.0000 mg | ORAL_TABLET | Freq: Every day | ORAL | 0 refills | Status: DC

## 2023-09-20 MED ORDER — BUPROPION HCL ER (XL) 300 MG PO TB24: 300.0000 mg | ORAL_TABLET | Freq: Every day | ORAL | 0 refills | Status: DC

## 2023-09-20 MED ORDER — SERTRALINE HCL 100 MG PO TABS: 200.0000 mg | ORAL_TABLET | Freq: Every day | ORAL | 0 refills | Status: DC

## 2023-09-20 MED ORDER — ROSUVASTATIN CALCIUM 20 MG PO TABS: 20.0000 mg | ORAL_TABLET | Freq: Every day | ORAL | 0 refills | Status: DC

## 2023-09-20 MED ORDER — QUETIAPINE FUMARATE ER 50 MG PO TB24: 100.0000 mg | ORAL_TABLET | Freq: Every day | ORAL | 0 refills | Status: DC

## 2023-09-20 MED ORDER — CARVEDILOL 12.5 MG PO TABS: 12.5000 mg | ORAL_TABLET | Freq: Two times a day (BID) | ORAL | 0 refills | Status: DC

## 2023-09-20 NOTE — Assessment & Plan Note (Signed)
Doing great with A1c of 5.9 not on medicine. Continue to monitor. Call with any concerns.

## 2023-09-20 NOTE — Progress Notes (Signed)
BP (!) 143/73   Pulse 80   Temp 98.6 F (37 C)   Wt 176 lb (79.8 kg)   SpO2 96%   BMI 30.21 kg/m    Subjective:    [name], female    DOB: 1946-03-27, 78 y.o.   MRN: [id number]  HPI: Sarah White is a 78 y.o. female  Chief Complaint  Patient presents with   Diabetes   DIABETES Hypoglycemic episodes:no Polydipsia/polyuria: no Visual disturbance: no Chest pain: no Paresthesias: no Glucose Monitoring: no  Accucheck frequency: Not Checking Taking Insulin?: no Blood Pressure Monitoring: not checking Retinal Examination: Up to Date Foot Exam: Up to Date Diabetic Education: Completed Pneumovax: Up to Date Influenza: Up to Date Aspirin: no  Relevant past medical, surgical, family and social history reviewed and updated as indicated. Interim medical history since our last visit reviewed. Allergies and medications reviewed and updated.  Review of Systems  Constitutional: Negative.   Respiratory: Negative.    Cardiovascular: Negative.   Gastrointestinal: Negative.   Musculoskeletal: Negative.   Neurological: Negative.   Psychiatric/Behavioral: Negative.      Per HPI unless specifically indicated above     Objective:    BP (!) 143/73   Pulse 80   Temp 98.6 F (37 C)   Wt 176 lb (79.8 kg)   SpO2 96%   BMI 30.21 kg/m   Wt Readings from Last 3 Encounters:  09/20/23 176 lb (79.8 kg)  06/22/23 170 lb (77.1 kg)  05/17/23 174 lb 3.2 oz (79 kg)    Physical Exam Vitals and nursing note reviewed.  Constitutional:      General: She is not in acute distress.    Appearance: Normal appearance. She is not ill-appearing, toxic-appearing or diaphoretic.  HENT:     Head: Normocephalic and atraumatic.     Right Ear: External ear normal.     Left Ear: External ear normal.     Nose: Nose normal.     Mouth/Throat:     Mouth: Mucous membranes are moist.     Pharynx: Oropharynx is clear.  Eyes:     General: No scleral icterus.       Right eye: No  discharge.        Left eye: No discharge.     Extraocular Movements: Extraocular movements intact.     Conjunctiva/sclera: Conjunctivae normal.     Pupils: Pupils are equal, round, and reactive to light.  Cardiovascular:     Rate and Rhythm: Normal rate and regular rhythm.     Pulses: Normal pulses.     Heart sounds: Normal heart sounds. No murmur heard.    No friction rub. No gallop.  Pulmonary:     Effort: Pulmonary effort is normal. No respiratory distress.     Breath sounds: Normal breath sounds. No stridor. No wheezing, rhonchi or rales.  Chest:     Chest wall: No tenderness.  Musculoskeletal:        General: Normal range of motion.     Cervical back: Normal range of motion and neck supple.  Skin:    General: Skin is warm and dry.     Capillary Refill: Capillary refill takes less than 2 seconds.     Coloration: Skin is not jaundiced or pale.     Findings: No bruising, erythema, lesion or rash.  Neurological:     General: No focal deficit present.     Mental Status: She is alert and oriented to person, place,  and time. Mental status is at baseline.  Psychiatric:        Mood and Affect: Mood normal.        Behavior: Behavior normal.        Thought Content: Thought content normal.        Judgment: Judgment normal.     Results for orders placed or performed in visit on 05/17/23  CBC with Differential/Platelet   Collection Time: 05/17/23  4:53 PM  Result Value Ref Range   WBC 5.8 3.4 - 10.8 x10E3/uL   RBC 4.75 3.77 - 5.28 x10E6/uL   Hemoglobin 13.2 11.1 - 15.9 g/dL   Hematocrit 16.1 09.6 - 46.6 %   MCV 87 79 - 97 fL   MCH 27.8 26.6 - 33.0 pg   MCHC 32.0 31.5 - 35.7 g/dL   RDW 04.5 40.9 - 81.1 %   Platelets 151 150 - 450 x10E3/uL   Neutrophils 73 Not Estab. %   Lymphs 16 Not Estab. %   Monocytes 7 Not Estab. %   Eos 2 Not Estab. %   Basos 1 Not Estab. %   Neutrophils Absolute 4.3 1.4 - 7.0 x10E3/uL   Lymphocytes Absolute 0.9 0.7 - 3.1 x10E3/uL   Monocytes Absolute  0.4 0.1 - 0.9 x10E3/uL   EOS (ABSOLUTE) 0.1 0.0 - 0.4 x10E3/uL   Basophils Absolute 0.0 0.0 - 0.2 x10E3/uL   Immature Granulocytes 1 Not Estab. %   Immature Grans (Abs) 0.0 0.0 - 0.1 x10E3/uL  Comprehensive metabolic panel   Collection Time: 05/17/23  4:53 PM  Result Value Ref Range   Glucose 83 70 - 99 mg/dL   BUN 13 8 - 27 mg/dL   Creatinine, Ser 9.14 (H) 0.57 - 1.00 mg/dL   eGFR 54 (L) >78 GN/FAO/1.30   BUN/Creatinine Ratio 12 12 - 28   Sodium 142 134 - 144 mmol/L   Potassium 4.7 3.5 - 5.2 mmol/L   Chloride 105 96 - 106 mmol/L   CO2 20 20 - 29 mmol/L   Calcium 8.9 8.7 - 10.3 mg/dL   Total Protein 6.0 6.0 - 8.5 g/dL   Albumin 4.2 3.8 - 4.8 g/dL   Globulin, Total 1.8 1.5 - 4.5 g/dL   Bilirubin Total 0.3 0.0 - 1.2 mg/dL   Alkaline Phosphatase 115 44 - 121 IU/L   AST 17 0 - 40 IU/L   ALT 14 0 - 32 IU/L  Lipid Panel w/o Chol/HDL Ratio   Collection Time: 05/17/23  4:53 PM  Result Value Ref Range   Cholesterol, Total 161 100 - 199 mg/dL   Triglycerides 865 (H) 0 - 149 mg/dL   HDL 49 >78 mg/dL   VLDL Cholesterol Cal 26 5 - 40 mg/dL   LDL Chol Calc (NIH) 86 0 - 99 mg/dL  TSH   Collection Time: 05/17/23  4:53 PM  Result Value Ref Range   TSH 2.530 0.450 - 4.500 uIU/mL  Hgb A1c w/o eAG   Collection Time: 05/17/23  4:53 PM  Result Value Ref Range   Hgb A1c MFr Bld 5.6 4.8 - 5.6 %      Assessment & Plan:   Problem List Items Addressed This Visit       Cardiovascular and Mediastinum   CAD (coronary artery disease)   Has not seen her heart doctor in almost 5 years. Has been off her amiodarone and entresto for unknown amount of time. Will get her into a new cardiologist at her request for evaluation of restarting meds. Call  with any concerns. Continue to monitor.       Relevant Medications   carvedilol (COREG) 12.5 MG tablet   furosemide (LASIX) 40 MG tablet   rosuvastatin (CRESTOR) 20 MG tablet   Other Relevant Orders   Ambulatory referral to Cardiology   Unstable  angina [facility])   Has not seen her heart doctor in almost 5 years. Has been off her amiodarone and entresto for unknown amount of time. Will get her into a new cardiologist at her request for evaluation of restarting meds. Call with any concerns. Continue to monitor.       Relevant Medications   carvedilol (COREG) 12.5 MG tablet   furosemide (LASIX) 40 MG tablet   rosuvastatin (CRESTOR) 20 MG tablet   Other Relevant Orders   Ambulatory referral to Cardiology   Aortic atherosclerosis (HCC)   Has not seen her heart doctor in almost 5 years. Has been off her amiodarone and entresto for unknown amount of time. Will get her into a new cardiologist at her request for evaluation of restarting meds. Call with any concerns. Continue to monitor.       Relevant Medications   carvedilol (COREG) 12.5 MG tablet   furosemide (LASIX) 40 MG tablet   rosuvastatin (CRESTOR) 20 MG tablet   Other Relevant Orders   Ambulatory referral to Cardiology   Chronic systolic heart failure (HCC)   Has not seen her heart doctor in almost 5 years. Has been off her amiodarone and entresto for unknown amount of time. Will get her into a new cardiologist at her request for evaluation of restarting meds. Call with any concerns. Continue to monitor.       Relevant Medications   carvedilol (COREG) 12.5 MG tablet   furosemide (LASIX) 40 MG tablet   rosuvastatin (CRESTOR) 20 MG tablet   Other Relevant Orders   Ambulatory referral to Cardiology     Endocrine   Diabetes mellitus with chronic kidney disease (HCC) - Primary   Doing great with A1c of 5.9 not on medicine. Continue to monitor. Call with any concerns.       Relevant Medications   rosuvastatin (CRESTOR) 20 MG tablet   Other Relevant Orders   Bayer DCA Hb A1c Waived   Microalbumin, Urine Waived     Genitourinary   Hypertensive renal disease   BP running high. Did not take her medicine today. Encouraged her to take her medicine. Recheck 3 months. Call with any  concerns.         Follow up plan: Return in about 3 months (around 12/19/2023).

## 2023-09-20 NOTE — Assessment & Plan Note (Signed)
Has not seen her heart doctor in almost 5 years. Has been off her amiodarone and entresto for unknown amount of time. Will get her into a new cardiologist at her request for evaluation of restarting meds. Call with any concerns. Continue to monitor.

## 2023-09-20 NOTE — Assessment & Plan Note (Signed)
BP running high. Did not take her medicine today. Encouraged her to take her medicine. Recheck 3 months. Call with any concerns.

## 2023-09-20 NOTE — Telephone Encounter (Signed)
IMPRESSION: 1. Lung-RADS 4BS, suspicious. Additional imaging evaluation or consultation with Pulmonology or Thoracic Surgery recommended. 2. The "S" modifier above refers to potentially clinically significant non lung cancer related findings. Specifically, there is aortic atherosclerosis, in addition to left main and three-vessel coronary artery disease. Please note that although the presence of coronary artery calcium documents the presence of coronary artery disease, the severity of this disease and any potential stenosis cannot be assessed on this non-gated CT examination. Assessment for potential risk factor modification, dietary therapy or pharmacologic therapy may be warranted, if clinically indicated. 3. Mild diffuse bronchial wall thickening with mild centrilobular and paraseptal emphysema; imaging findings suggestive of underlying COPD.

## 2023-09-20 NOTE — Telephone Encounter (Signed)
Diane calling with call report. For CT scan. Diane phone number is [phone].

## 2023-09-21 ENCOUNTER — Encounter: Payer: Self-pay | Admitting: Family Medicine

## 2023-09-24 ENCOUNTER — Telehealth: Payer: Self-pay | Admitting: Acute Care

## 2023-09-24 ENCOUNTER — Other Ambulatory Visit: Payer: Self-pay | Admitting: Acute Care

## 2023-09-24 DIAGNOSIS — R918 Other nonspecific abnormal finding of lung field: Secondary | ICD-10-CM

## 2023-09-24 NOTE — Telephone Encounter (Signed)
I have called the patient with the results of her low dose Ct chest. Her scan was read as a 4 B. She has multiple pulmonary nodules are noted throughout the lungs bilaterally, largest of which is in the medial aspect of the right upper lobe (axial image 84), where there is a irregular-shaped nodule with ill-defined margins with a volume derived mean diameter of 21.9 mm. She denies being sick, no hemoptysis or unexplained weight loss.  I have placed an order for a PET scan and she will follow up with Dr. Jayme Cloud in the [facility] office within a week after the scan has been completed.  Jonita Albee and [facility], please fax results to PCP and and let them know plan is for PET and follow up with Dr. Jayme Cloud. Thanks so much. I did talk with her about her CAD. She is on statin and plavix, managed by her PCP.

## 2023-09-24 NOTE — Telephone Encounter (Signed)
She will definitely need cards eval!

## 2023-09-27 NOTE — Telephone Encounter (Signed)
Results / plans faxed to PCP.  Foye Clock, can you make sure pt has a follow up with Dr Jayme Cloud following her PET on 09/29/23?

## 2023-09-27 NOTE — Telephone Encounter (Signed)
I spoke with the patient and scheduled her an appt for 2/4 at 3:30pm.  Nothing further needed.

## 2023-09-27 NOTE — Telephone Encounter (Signed)
See telephone note from 09/24/23.

## 2023-09-28 ENCOUNTER — Institutional Professional Consult (permissible substitution): Payer: Medicare Other | Admitting: Pulmonary Disease

## 2023-09-29 ENCOUNTER — Ambulatory Visit
Admission: RE | Admit: 2023-09-29 | Discharge: 2023-09-29 | Disposition: A | Discharge status: Home / Self Care | Payer: Medicare Other | Source: Ambulatory Visit | Attending: Acute Care | Admitting: Acute Care

## 2023-09-29 DIAGNOSIS — I700 Atherosclerosis of aorta: Secondary | ICD-10-CM | POA: Insufficient documentation

## 2023-09-29 DIAGNOSIS — R918 Other nonspecific abnormal finding of lung field: Secondary | ICD-10-CM | POA: Diagnosis not present

## 2023-09-29 DIAGNOSIS — I2510 Atherosclerotic heart disease of native coronary artery without angina pectoris: Secondary | ICD-10-CM | POA: Diagnosis not present

## 2023-09-29 DIAGNOSIS — J439 Emphysema, unspecified: Secondary | ICD-10-CM | POA: Diagnosis not present

## 2023-09-29 DIAGNOSIS — K5730 Diverticulosis of large intestine without perforation or abscess without bleeding: Secondary | ICD-10-CM | POA: Insufficient documentation

## 2023-09-29 LAB — GLUCOSE, CAPILLARY: Glucose-Capillary: 120 mg/dL — ABNORMAL HIGH (ref 70–99)

## 2023-09-29 MED ORDER — FLUDEOXYGLUCOSE F - 18 (FDG) INJECTION
9.1200 | Freq: Once | INTRAVENOUS | Status: AC | PRN
  Administered 2023-09-29: 9.12 via INTRAVENOUS

## 2023-10-05 ENCOUNTER — Encounter: Payer: Self-pay | Admitting: Pulmonary Disease

## 2023-10-05 ENCOUNTER — Ambulatory Visit: Payer: Medicare Other | Admitting: Pulmonary Disease

## 2023-10-05 VITALS — BP 122/74 | HR 71 | Temp 97.1°F | Ht 64.0 in | Wt 176.0 lb

## 2023-10-05 DIAGNOSIS — I5022 Chronic systolic (congestive) heart failure: Secondary | ICD-10-CM

## 2023-10-05 DIAGNOSIS — I342 Nonrheumatic mitral (valve) stenosis: Secondary | ICD-10-CM | POA: Diagnosis not present

## 2023-10-05 DIAGNOSIS — J449 Chronic obstructive pulmonary disease, unspecified: Secondary | ICD-10-CM | POA: Diagnosis not present

## 2023-10-05 DIAGNOSIS — R911 Solitary pulmonary nodule: Secondary | ICD-10-CM | POA: Diagnosis not present

## 2023-10-05 DIAGNOSIS — F17210 Nicotine dependence, cigarettes, uncomplicated: Secondary | ICD-10-CM | POA: Diagnosis not present

## 2023-10-05 NOTE — Patient Instructions (Signed)
 VISIT SUMMARY:  Today, we reviewed the results of your recent lung cancer screening, which identified a concerning nodule in your right lung. We discussed the next steps, including a biopsy to confirm the diagnosis and the need for a cardiology evaluation to ensure it is safe to proceed. We also reviewed your emphysema and heart condition, and discussed smoking cessation options.  YOUR PLAN:  -LUNG MASS: A nodule in your right lung shows signs that it could be cancerous. We need to perform a biopsy to confirm this, but first, you will need to stop taking Plavix  to reduce bleeding risk, this will need to be moved by cardiology. We will also refer you to a cardiologist to ensure it is safe to undergo the biopsy under general anesthesia.  It is possible that your risk for general anesthesia is prohibitive so in that situation we would have to consider empiric radiation therapy meaning radiation therapy to the lung nodule/mass without a biopsy.  This is not ideal but at least would afford you some treatment.  -EMPHYSEMA/COPD: Emphysema is a lung condition that causes shortness of breath. We discussed increasing the strength of your Bevespi inhaler if it is covered by your medication assistance program.  You are to remain on your current inhaler regimen.  We will order pulmonary function tests to better understand the severity of your condition.  -CONGESTIVE HEART FAILURE: Congestive heart failure means your heart is not pumping blood as well as it should. We will refer you to a cardiologist for an echocardiogram to check your heart function and valve status. This will help us  determine if it is safe for you to have the lung biopsy under general anesthesia.  -GENERAL HEALTH MAINTENANCE: We discussed the importance of quitting smoking to improve your lung and heart health. Resources and options for smoking cessation were provided.  INSTRUCTIONS:  - Arrange a follow-up appointment with the cardiologist. -  Schedule a follow-up visit to review the biopsy results and adjust the treatment plan as necessary.

## 2023-10-05 NOTE — Progress Notes (Signed)
 Subjective:    [name], female    DOB: June 13, 1946, 78 y.o.   MRN: [id number]  Patient Care Team: Vicci Duwaine SQUIBB, [name] Nyle Rankin POUR, [facility] (Inactive) (Pharmacist) Perla Evalene PARAS, MD as Consulting Physician (Cardiology)  Chief Complaint  Patient presents with   Consult    BACKGROUND: The patient is a 78 year old current smoker who presents for evaluation of a lung nodule noted on lung cancer screening.  She is kindly referred by Dr. Duwaine Vicci.   HPI Discussed the use of AI scribe software for clinical note transcription with the patient, who gave verbal consent to proceed.  History of Present Illness   Sarah White is a 78 year old female with emphysema and prior issues with ischemic cardiomyopathy, who presents for evaluation of lung cancer screening results.  Lung cancer screening results have identified a nodule in the right lung with significant activity on PET scan, raising concerns for malignancy. Additional smaller nodules appear more related to inflammation. The primary concern is the active nodule, there does not appear to be any mediastinal adenopathy.  She smokes a pack of cigarettes a day and uses two inhalers, including albuterol , two to three times daily as needed.  Her maintenance inhaler is Bevespi.  She has a history of recurrent and prolonged pneumonia. Recently, she experienced significant congestion due to a cold around Christmas and New Year's, which is now resolving. She has a nebulizer at home but finds it unreliable and tedious to use.  She has experienced episodes of syncope due to breathing difficulties, requiring ambulance intervention. Her last cardiology visit was approximately two years ago, and she has not been on regular follow-up since then. She has a history of heart issues, including a heart function of 30-35% in 2021 and narrowing of a heart valve (mitral stenosis), which may contribute to  her symptoms of presyncope.  She is allergic to some medications, including blood pressure medications and metformin . She is currently on Plavix  and baby aspirin  for blood thinning.  She has undergone stenting in the past and has been under general anesthesia multiple times, including for gallbladder and pancreas issues about three to four years ago.   She previously followed at [facility] cardiology however she is out of network with her insurance and also she had wished to switch cardiologists.  She is very sedentary, she presents today on transport chair due to lower extremity weakness and inability to walk steadily.  She has required oxygen in the past but does not require it now.     Review of Systems A 10 point review of systems was performed and it is as noted above otherwise negative.   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, right     Past Surgical History:  Procedure Laterality Date   CAROTID PTA/STENT INTERVENTION Right 03/06/2020   Procedure: CAROTID PTA/STENT INTERVENTION;  Surgeon: Jama Cordella MATSU, MD;  Location: [facility] INVASIVE  CV LAB;  Service: Cardiovascular;  Laterality: Right;   CARPAL TUNNEL RELEASE Bilateral    CESAREAN SECTION     CHOLECYSTECTOMY N/A 09/12/2018   Procedure: LAPAROSCOPIC CHOLECYSTECTOMY WITH INTRAOPERATIVE CHOLANGIOGRAM;  Surgeon: Rodolph Romano, MD;  Location: [facility] ORS;  Service: General;  Laterality: N/A;   COLONOSCOPY WITH PROPOFOL  N/A 09/10/2021    Procedure: COLONOSCOPY WITH PROPOFOL ;  Surgeon: Unk Corinn Skiff, MD;  Location: [facility] ENDOSCOPY;  Service: Gastroenterology;  Laterality: N/A;   CORONARY ANGIOPLASTY WITH STENT PLACEMENT     ESOPHAGOGASTRODUODENOSCOPY N/A 09/10/2021   Procedure: ESOPHAGOGASTRODUODENOSCOPY (EGD);  Surgeon: Unk Corinn Skiff, MD;  Location: [facility] ENDOSCOPY;  Service: Gastroenterology;  Laterality: N/A;   KNEE SURGERY Left    LEFT HEART CATH AND CORONARY ANGIOGRAPHY Right 11/26/2016   Procedure: Left Heart Cath and Coronary Angiography;  Surgeon: Marsa Dooms, MD;  Location: [facility] INVASIVE CV LAB;  Service: Cardiovascular;  Laterality: Right;   LEFT HEART CATH AND CORONARY ANGIOGRAPHY N/A 01/23/2020   Procedure: LEFT HEART CATH AND CORONARY ANGIOGRAPHY;  Surgeon: Florencio Cara BIRCH, MD;  Location: [facility] INVASIVE CV LAB;  Service: Cardiovascular;  Laterality: N/A;   TOTAL ABDOMINAL HYSTERECTOMY      Patient Active Problem List   Diagnosis Date Noted   Chronic systolic heart failure (HCC) 05/17/2023   Personal history of nicotine  dependence 09/14/2022   Chronic diarrhea of unknown origin    Gastric erythema    Aortic atherosclerosis (HCC) 11/20/2020   Falls frequently 07/30/2020   Bilateral hearing loss 06/26/2020   Hallucinations, visual 06/26/2020   Memory difficulty 06/26/2020   Symptomatic carotid artery narrowing without infarction 03/06/2020   Symptomatic carotid artery stenosis without infarction 02/04/2020   Insomnia 05/05/2018   Squamous cell carcinoma of arm, right 01/07/2017   Depression, major, single episode, moderate (HCC) 12/01/2016   Unstable angina (HCC) 11/26/2016   Near syncope 03/11/2016   Hypokalemia 03/11/2016   Orthostatic hypotension 08/12/2015   Hyperlipidemia 07/29/2015   Hypertensive renal disease 07/25/2015   Low back pain 07/25/2015   Smoker 07/22/2015   COPD (chronic obstructive pulmonary disease) (HCC) 07/22/2015   Diabetes mellitus with chronic kidney disease  (HCC) 07/22/2015   CAD (coronary artery disease) 07/22/2015    Family History  Problem Relation Age of Onset   Heart disease Mother     Social History   Tobacco Use   Smoking status: Every Day    Current packs/day: 1.00    Average packs/day: 1 pack/day for 65.1 years (65.1 ttl pk-yrs)    Types: Cigarettes    Start date: 1960   Smokeless tobacco: Never   Tobacco comments:    Started smoking at 78 years old    Smoked 1 PPD - khj 10/05/2023  Substance Use Topics   Alcohol  use: No    Allergies  Allergen Reactions   Amlodipine  Swelling   Atorvastatin  Other (See Comments)    myalgia   Codeine Nausea And Vomiting   Hctz [Hydrochlorothiazide ] Other (See Comments)    Hypokalemia   Lisinopril  Other (See Comments)    Dizziness    Metformin  And Related Nausea Only    Nausea, dizziness    Current Meds  Medication Sig   albuterol  (VENTOLIN  HFA) 108 (90 Base) MCG/ACT inhaler Inhale 2 puffs into the lungs every 6 (six) hours as needed for wheezing or shortness of breath.   aspirin  EC 81 MG tablet Take 81 mg by mouth at bedtime.    buPROPion  (WELLBUTRIN  XL) 300 MG 24 hr tablet Take 1 tablet (300 mg  total) by mouth daily.   carvedilol  (COREG ) 12.5 MG tablet Take 1 tablet (12.5 mg total) by mouth 2 (two) times daily with a meal.   clopidogrel  (PLAVIX ) 75 MG tablet Take 1 tablet (75 mg total) by mouth daily at 6 (six) AM.   diphenhydrAMINE  (BENADRYL ) 25 MG tablet Take 25 mg by mouth at bedtime as needed for allergies. Dies not take regularly   donepezil (ARICEPT) 10 MG tablet TAKE 1 TABLET BY MOUTH NIGHTLY FOR 30 DAYS   furosemide  (LASIX ) 40 MG tablet Take 1 tablet (40 mg total) by mouth daily.   Glycopyrrolate-Formoterol  (BEVESPI AEROSPHERE) 9-4.8 MCG/ACT AERO Inhale 2 puffs into the lungs 2 (two) times daily.   lidocaine  (LIDODERM ) 5 % Place 1 patch onto the skin daily. Remove & Discard patch within 12 hours or as directed by MD   nitroGLYCERIN  (NITROSTAT ) 0.4 MG SL tablet DISSOLVE  ONE TABLET UNDER THE TONGUE EVERY 5 MINUTES AS NEEDED FOR CHEST PAIN.  DO NOT EXCEED A TOTAL OF 3 DOSES IN 15 MINUTES   ondansetron  (ZOFRAN ) 8 MG tablet Take 1 tablet (8 mg total) by mouth every 8 (eight) hours as needed for nausea or vomiting.   pantoprazole  (PROTONIX ) 40 MG tablet Take 1 tablet (40 mg total) by mouth 2 (two) times daily before a meal.   promethazine  (PHENERGAN ) 25 MG suppository Place 1 suppository (25 mg total) rectally every 6 (six) hours as needed for nausea or vomiting.   QUEtiapine  (SEROQUEL  XR) 50 MG TB24 24 hr tablet Take 2 tablets (100 mg total) by mouth at bedtime.   rosuvastatin  (CRESTOR ) 20 MG tablet Take 1 tablet (20 mg total) by mouth daily.   sertraline  (ZOLOFT ) 100 MG tablet Take 2 tablets (200 mg total) by mouth daily.   traZODone  (DESYREL ) 50 MG tablet Take 1 tablet (50 mg total) by mouth at bedtime as needed. for sleep   triamcinolone  ointment (KENALOG ) 0.5 % Apply 1 application topically 2 (two) times daily.    Immunization History  Administered Date(s) Administered   Fluad Quad(high Dose 65+) 04/28/2019, 05/24/2020, 06/23/2021, 05/28/2022   Fluad Trivalent(High Dose 65+) 05/17/2023   Influenza, High Dose Seasonal PF 05/18/2016, 05/25/2017, 05/05/2018   Influenza, Seasonal, Injecte, Preservative Fre 06/05/2015   PFIZER(Purple Top)SARS-COV-2 Vaccination 10/16/2019, 11/06/2019, 09/24/2020   Pfizer(Comirnaty)Fall Seasonal Vaccine 12 years and older 05/17/2023   Pneumococcal Conjugate-13 07/29/2015   Pneumococcal Polysaccharide-23 09/15/2016   Tdap 09/24/2015   Zoster, Live 02/07/2015        Objective:     BP 122/74 (BP Location: Right Arm, Cuff Size: Normal)   Pulse 71   Temp (!) 97.1 F (36.2 C)   Ht 5' 4 (1.626 m)   Wt 176 lb (79.8 kg) Comment: per patient. in a wheelchair today  SpO2 92%   BMI 30.21 kg/m   SpO2: 92 % O2 Device: None (Room air)  GENERAL: Obese chronically ill-appearing woman, no acute distress, presents in transport  chair.  No conversational dyspnea. HEAD: Normocephalic, atraumatic.  EYES: Pupils equal, round, reactive to light.  No scleral icterus.  MOUTH: Edentulous, oral mucosa moist.  No thrush. NECK: Supple. No thyromegaly. Trachea midline. No JVD.  No adenopathy. PULMONARY: Good air entry bilaterally.  No adventitious sounds. CARDIOVASCULAR: S1 and S2. Regular rate and rhythm.  Faint diastolic murmur at the cardiac apex. ABDOMEN: Protuberant, otherwise benign. MUSCULOSKELETAL: No joint deformity, no clubbing, no edema.  NEUROLOGIC: Unsteady gait.  No overt focal deficit. SKIN: Intact,warm,dry. PSYCH: Mood and behavior normal   Representative  images from the CT performed 15 September 2023 and PET/CT performed 29 September 2023 showing the nodule in question having significant activity and highly suggestive of malignancy:       Assessment & Plan:     ICD-10-CM   1. Lung nodule  R91.1     2. Chronic obstructive pulmonary disease, unspecified COPD type (HCC)  J44.9 Pulmonary Function Test [facility] Only    3. Nonrheumatic mitral valve stenosis  I34.2 ECHOCARDIOGRAM COMPLETE    Ambulatory referral to Cardiology    4. Chronic systolic heart failure (HCC)  P49.77 ECHOCARDIOGRAM COMPLETE    Ambulatory referral to Cardiology    5. Tobacco dependence due to cigarettes  F17.210       Orders Placed This Encounter  Procedures   Ambulatory referral to Cardiology    Referral Priority:   Urgent    Referral Type:   Consultation    Referral Reason:   Specialty Services Required    Number of Visits Requested:   1   Pulmonary Function Test [facility] Only    Standing Status:   Future    Expected Date:   10/19/2023    Expiration Date:   10/04/2024    Full PFT: includes the following: basic spirometry, spirometry pre & post bronchodilator, diffusion capacity (DLCO), lung volumes:   Full PFT    This test can only be performed at:   [facility]   ECHOCARDIOGRAM COMPLETE    Standing Status:   Future     Expected Date:   10/12/2023    Expiration Date:   10/04/2024    Where should this test be performed:   [facility]    Perflutren  DEFINITY  (image enhancing agent) should be administered unless hypersensitivity or allergy exist:   Administer Perflutren     Reason for exam-Echo:   Cardiomyopathy-Ischemic I25.5    Discussion:    Lung Mass/Nodule A lung mass/nodule in the right lung was identified on her lung cancer screening scan, with significant activity on PET scan suggesting malignancy. Additional smaller nodules appear inflammatory with no significant PET activity. Her history of emphysema and recurrent pneumonia complicates the clinical picture. Discussed the need for a biopsy under general anesthesia to confirm the diagnosis, including risks such as potential lung collapse and overnight hospitalization. Discussed discontinuing Plavix  prior to the procedure to reduce bleeding risk. Alternative treatment options include radiation therapy if biopsy is not feasible due to cardiac risks. - Refer to cardiologist for preoperative assessment to determine safety of biopsy under general anesthesia. - Discontinue Plavix  prior to biopsy to reduce bleeding risk.  This has to be cleared by cardiology. - Schedule lung biopsy under general anesthesia if risk is not prohibitive. - Discuss potential risks of lung biopsy, including lung collapse and overnight hospitalization.  Emphysema/COPD She has emphysema, seen clearly on the lung scan. Uses albuterol  2-3 times daily and has significant mucus production and congestion. Discussed switching Bevespi to Breztri  if covered by her medication assistance program. - Continue Bevespi for now, consider switch to Breztri . - Order pulmonary function tests to assess emphysema/COPD severity.  Congestive Heart Failure She has a history congestive heart failure with an ejection fraction of 30-35% as of 2021, a history of stenting, and critical valve narrowing  contributing to syncope (mitral stenosis). Discussed the need for an echocardiogram and cardiologist evaluation to assess current heart function and valve status before proceeding with lung biopsy. - Refer to cardiologist for evaluation and echocardiogram to assess heart function and valve status. - Coordinate with  cardiologist to manage heart condition and determine safety of general anesthesia for lung biopsy.  General Health Maintenance She is a current smoker, consuming a pack a day, exacerbating her lung and heart conditions. Discussed smoking cessation options and provided resources. - Discuss smoking cessation options and provide resources.  Follow-up - Arrange appointment with cardiologist for evaluation. - Previously seen by [facility] cardiology however not on network for the patient will switch to [facility]. - Schedule follow-up visit to review next steps and adjust treatment plan as necessary.      Advised if symptoms do not improve or worsen, to please contact office for sooner follow up or seek emergency care.    I spent 60 minutes of dedicated to the care of this patient on the date of this encounter to include pre-visit review of records, face-to-face time with the patient discussing conditions above, post visit ordering of testing, clinical documentation with the electronic health record, making appropriate referrals as documented, and communicating necessary findings to members of the patients care team.   C. Leita Sanders, MD Advanced Bronchoscopy PCCM [facility]    *This note was dictated using voice recognition software/Dragon.  Despite best efforts to proofread, errors can occur which can change the meaning. Any transcriptional errors that result from this process are unintentional and may not be fully corrected at the time of dictation.

## 2023-10-13 ENCOUNTER — Telehealth: Payer: Self-pay | Admitting: Pulmonary Disease

## 2023-10-13 DIAGNOSIS — I10 Essential (primary) hypertension: Secondary | ICD-10-CM | POA: Diagnosis not present

## 2023-10-13 DIAGNOSIS — E782 Mixed hyperlipidemia: Secondary | ICD-10-CM | POA: Diagnosis not present

## 2023-10-13 DIAGNOSIS — I5020 Unspecified systolic (congestive) heart failure: Secondary | ICD-10-CM | POA: Diagnosis not present

## 2023-10-13 NOTE — Telephone Encounter (Signed)
Answering Service:   Wants to know when she can stop taking blood thinner.

## 2023-10-13 NOTE — Telephone Encounter (Signed)
She needs to be evaluated by cardiology.  I suspect the blood thinner (Plavix) was prescribed by cardiology initially.  We have not determined if she is safe for a procedure under general anesthesia due to her cardiac issues previously.  Has she made an appointment with cardiology?  He has a follow-up with me in mid March to reassess.

## 2023-10-14 NOTE — Telephone Encounter (Signed)
The patient is aware.  Nothing further needed.

## 2023-10-14 NOTE — Telephone Encounter (Signed)
I spoke with the patient. She saw her Cardiologist yesterday (at [place]) and they said she could stop. Please advise.

## 2023-10-14 NOTE — Telephone Encounter (Signed)
She needs to proceed with the studies that cardiology wants her to do.  Until she follows up in March there is no need to stop the Plavix until we schedule a procedure.  We will discuss on her follow-up visit.

## 2023-10-18 ENCOUNTER — Telehealth: Payer: Self-pay | Admitting: Pulmonary Disease

## 2023-10-18 NOTE — Telephone Encounter (Signed)
 Patient would like to schedule Bronch procedure. Patient phone number is [phone].

## 2023-10-18 NOTE — Telephone Encounter (Signed)
 I have notified the patient. Nothing further needed.

## 2023-10-18 NOTE — Telephone Encounter (Signed)
 When she came to see me I made it clear that several things needed to happen.  She needs testing to ensure that she can tolerate general anesthesia which would be required for the procedure.  Date, this testing has not occurred it is pulmonary function test and echocardiogram.  She is also on Plavix and has significant history of heart disease though she saw the cardiologist on 12 February there is no note attached to that encounter that lets me know that it is safe for her to undergo general anesthesia.  This is the reason why we had set up a follow-up appointment.  We need to make sure that her PFTs and echocardiogram are done and that her heart can take general anesthesia.  She had a very low heart function when it was checked last.

## 2023-10-20 ENCOUNTER — Encounter: Admission: RE | Payer: Self-pay | Source: Home / Self Care

## 2023-10-20 ENCOUNTER — Ambulatory Visit: Admission: RE | Admit: 2023-10-20 | Payer: Medicare Other | Source: Home / Self Care | Admitting: Pulmonary Disease

## 2023-10-20 SURGERY — ROBOTIC ASSISTED NAVIGATIONAL BRONCHOSCOPY
Anesthesia: General | Laterality: Left

## 2023-10-21 ENCOUNTER — Encounter: Payer: Self-pay | Admitting: Pulmonary Disease

## 2023-10-21 NOTE — Telephone Encounter (Signed)
 Can we get her scheduled ASAP. Thank you!

## 2023-10-22 NOTE — Telephone Encounter (Signed)
 I have spoke with Sarah White and her PFT has been scheduled on 11/03/23 @ 4:00pm at [facility]. I have mailed the instructions to her

## 2023-11-03 ENCOUNTER — Ambulatory Visit: Payer: Medicare Other

## 2023-11-04 DIAGNOSIS — I5020 Unspecified systolic (congestive) heart failure: Secondary | ICD-10-CM | POA: Diagnosis not present

## 2023-11-09 DIAGNOSIS — I10 Essential (primary) hypertension: Secondary | ICD-10-CM | POA: Diagnosis not present

## 2023-11-09 DIAGNOSIS — E782 Mixed hyperlipidemia: Secondary | ICD-10-CM | POA: Diagnosis not present

## 2023-11-09 DIAGNOSIS — I5020 Unspecified systolic (congestive) heart failure: Secondary | ICD-10-CM | POA: Diagnosis not present

## 2023-11-11 ENCOUNTER — Encounter: Payer: Self-pay | Admitting: Pulmonary Disease

## 2023-11-11 ENCOUNTER — Ambulatory Visit (INDEPENDENT_AMBULATORY_CARE_PROVIDER_SITE_OTHER): Payer: Medicare Other | Admitting: Pulmonary Disease

## 2023-11-11 VITALS — BP 122/60 | HR 77 | Temp 97.1°F | Ht 64.0 in | Wt 174.0 lb

## 2023-11-11 DIAGNOSIS — F17210 Nicotine dependence, cigarettes, uncomplicated: Secondary | ICD-10-CM

## 2023-11-11 DIAGNOSIS — R911 Solitary pulmonary nodule: Secondary | ICD-10-CM

## 2023-11-11 DIAGNOSIS — I340 Nonrheumatic mitral (valve) insufficiency: Secondary | ICD-10-CM

## 2023-11-11 DIAGNOSIS — I5022 Chronic systolic (congestive) heart failure: Secondary | ICD-10-CM | POA: Diagnosis not present

## 2023-11-11 DIAGNOSIS — J449 Chronic obstructive pulmonary disease, unspecified: Secondary | ICD-10-CM | POA: Diagnosis not present

## 2023-11-11 DIAGNOSIS — C3491 Malignant neoplasm of unspecified part of right bronchus or lung: Secondary | ICD-10-CM

## 2023-11-11 MED ORDER — BREZTRI AEROSPHERE 160-9-4.8 MCG/ACT IN AERO: 2.0000 | INHALATION_SPRAY | Freq: Two times a day (BID) | RESPIRATORY_TRACT | 0 refills | Status: AC

## 2023-11-11 NOTE — Progress Notes (Signed)
 Subjective:    [name], female    DOB: 10-12-1945, 78 y.o.   MRN: [id number]  Patient Care Team: Dorcas Carrow, [name] Zettie Pho, [facility] (Inactive) (Pharmacist) Antonieta Iba, MD as Consulting Physician (Cardiology) Carmina Miller, MD as Consulting Physician (Radiation Oncology)  Chief Complaint  Patient presents with   Follow-up    DOE. Occasional wheezing. Cough with yellow sputum.     BACKGROUND/INTERVAL:The patient is a 78 year old current smoker who presents for follow-up of a lung nodule noted on lung cancer screening.  She was initially evaluated here on 05 October 2023.  At that time there was concern for her to undergo general anesthesia due to cardiac and pulmonary issues noted during that first visit.  She did not follow through with obtaining PFTs, she had an echocardiogram performed at [facility].  She presents here for discussion of next steps.  She continues to smoke 1 pack of cigarettes per day.  HPI Discussed the use of AI scribe software for clinical note transcription with the patient, who gave verbal consent to proceed.  History of Present Illness   The patient presents with a long-standing lung nodule and respiratory issues.  She presents with her son.  She is being followed for a long-standing lung nodule. She has not completed her scheduled breathing test due to a mix-up with the appointment date and a fall that occurred on the way to the appointment. The test is expected to be rescheduled for April.  She has a history of cardiac issues, including a weak heart and a leaky heart valve, which have worsened over time.  Her most recent echocardiogram was performed 04 November 2023 at [facility].  Shows that she has not LVEF of 40%, diastolic dysfunction grade 1, moderate mitral regurgitation.  Previously she had been noted to have mitral stenosis and no regurgitation raising the possibility that there  was a missed read in 1 of these echocardiograms.  These conditions have been present for years and contribute to her increased risk during procedures requiring anesthesia.  She smokes a pack of cigarettes a day, which contributes to her respiratory issues and overall health risks.  She experiences wheezing, which she attributes to allergies and limited exposure to outdoor environments. She mentions that she rarely leaves the house, going out only one or two times a year, with an exception last year when she went out three times.  She is very sedentary.  She is currently using Bevespi, two puffs twice a day, which she finds effective. She has experienced thrush multiple times in the past.   Discussed that the patient's options are limited due to significant underlying cardiac and pulmonary disease.  Patient is also very sedentary.  Patient is not enthusiastic about invasive procedures and the patient's son would prefer a more noninvasive approach.    Review of Systems A 10 point review of systems was performed and it is as noted above otherwise negative.   Patient Active Problem List   Diagnosis Date Noted   Chronic systolic heart failure (HCC) 05/17/2023   Personal history of nicotine dependence 09/14/2022   Chronic diarrhea of unknown origin    Gastric erythema    Aortic atherosclerosis (HCC) 11/20/2020   Falls frequently 07/30/2020   Bilateral hearing loss 06/26/2020   Hallucinations, visual 06/26/2020   Memory difficulty 06/26/2020   Symptomatic carotid artery narrowing without infarction 03/06/2020   Symptomatic carotid artery stenosis without infarction  02/04/2020   Insomnia 05/05/2018   Squamous cell carcinoma of arm, right 01/07/2017   Depression, major, single episode, moderate (HCC) 12/01/2016   Unstable angina (HCC) 11/26/2016   Near syncope 03/11/2016   Hypokalemia 03/11/2016   Orthostatic hypotension 08/12/2015   Hyperlipidemia 07/29/2015   Hypertensive renal disease  07/25/2015   Low back pain 07/25/2015   Smoker 07/22/2015   COPD (chronic obstructive pulmonary disease) (HCC) 07/22/2015   Diabetes mellitus with chronic kidney disease (HCC) 07/22/2015   CAD (coronary artery disease) 07/22/2015    Social History   Tobacco Use   Smoking status: Every Day    Current packs/day: 1.00    Average packs/day: 1 pack/day for 65.2 years (65.2 ttl pk-yrs)    Types: Cigarettes    Start date: 1960   Smokeless tobacco: Never   Tobacco comments:    Started smoking at 78 years old    Smokes 1 PPD - khj 11/11/2023  Substance Use Topics   Alcohol use: No    Allergies  Allergen Reactions   Amlodipine Swelling   Atorvastatin Other (See Comments)    myalgia   Codeine Nausea And Vomiting   Hctz [Hydrochlorothiazide] Other (See Comments)    Hypokalemia   Lisinopril Other (See Comments)    Dizziness    Metformin And Related Nausea Only    Nausea, dizziness    Current Meds  Medication Sig   albuterol (VENTOLIN HFA) 108 (90 Base) MCG/ACT inhaler Inhale 2 puffs into the lungs every 6 (six) hours as needed for wheezing or shortness of breath. (Patient not taking: Reported on 11/23/2023)   aspirin EC 81 MG tablet Take 81 mg by mouth at bedtime.    budeson-glycopyrrolate-formoterol (BREZTRI AEROSPHERE) 160-9-4.8 MCG/ACT AERO Inhale 2 puffs into the lungs in the morning and at bedtime.   buPROPion (WELLBUTRIN XL) 300 MG 24 hr tablet Take 1 tablet (300 mg total) by mouth daily.   carvedilol (COREG) 12.5 MG tablet Take 1 tablet (12.5 mg total) by mouth 2 (two) times daily with a meal.   clopidogrel (PLAVIX) 75 MG tablet Take 1 tablet (75 mg total) by mouth daily at 6 (six) AM.   diphenhydrAMINE (BENADRYL) 25 MG tablet Take 25 mg by mouth at bedtime as needed for allergies. Dies not take regularly   furosemide (LASIX) 40 MG tablet Take 1 tablet (40 mg total) by mouth daily.   Glycopyrrolate-Formoterol (BEVESPI AEROSPHERE) 9-4.8 MCG/ACT AERO Inhale 2 puffs into the  lungs 2 (two) times daily.   lidocaine (LIDODERM) 5 % Place 1 patch onto the skin daily. Remove & Discard patch within 12 hours or as directed by MD   nitroGLYCERIN (NITROSTAT) 0.4 MG SL tablet DISSOLVE ONE TABLET UNDER THE TONGUE EVERY 5 MINUTES AS NEEDED FOR CHEST PAIN.  DO NOT EXCEED A TOTAL OF 3 DOSES IN 15 MINUTES   ondansetron (ZOFRAN) 8 MG tablet Take 1 tablet (8 mg total) by mouth every 8 (eight) hours as needed for nausea or vomiting.   pantoprazole (PROTONIX) 40 MG tablet Take 1 tablet (40 mg total) by mouth 2 (two) times daily before a meal.   promethazine (PHENERGAN) 25 MG suppository Place 1 suppository (25 mg total) rectally every 6 (six) hours as needed for nausea or vomiting.   QUEtiapine (SEROQUEL XR) 50 MG TB24 24 hr tablet Take 2 tablets (100 mg total) by mouth at bedtime.   rosuvastatin (CRESTOR) 20 MG tablet Take 1 tablet (20 mg total) by mouth daily.   sertraline (ZOLOFT) 100 MG  tablet Take 2 tablets (200 mg total) by mouth daily.   traZODone (DESYREL) 50 MG tablet Take 1 tablet (50 mg total) by mouth at bedtime as needed. for sleep   triamcinolone ointment (KENALOG) 0.5 % Apply 1 application topically 2 (two) times daily.   [DISCONTINUED] donepezil (ARICEPT) 10 MG tablet TAKE 1 TABLET BY MOUTH NIGHTLY FOR 30 DAYS    Immunization History  Administered Date(s) Administered   Fluad Quad(high Dose 65+) 04/28/2019, 05/24/2020, 06/23/2021, 05/28/2022   Fluad Trivalent(High Dose 65+) 05/17/2023   Influenza, High Dose Seasonal PF 05/18/2016, 05/25/2017, 05/05/2018   Influenza, Seasonal, Injecte, Preservative Fre 06/05/2015   PFIZER(Purple Top)SARS-COV-2 Vaccination 10/16/2019, 11/06/2019, 09/24/2020   Pfizer(Comirnaty)Fall Seasonal Vaccine 12 years and older 05/17/2023   Pneumococcal Conjugate-13 07/29/2015   Pneumococcal Polysaccharide-23 09/15/2016   Tdap 09/24/2015   Zoster, Live 02/07/2015        Objective:     BP 122/60 (BP Location: Right Arm, Cuff Size: Normal)    Pulse 77   Temp (!) 97.1 F (36.2 C)   Ht 5\' 4"  (1.626 m)   Wt 174 lb (78.9 kg) Comment: per patient in a wheelchair today.  SpO2 92%   BMI 29.87 kg/m   SpO2: 92 % O2 Device: None (Room air)  GENERAL: Obese chronically ill-appearing woman, no acute distress, presents in transport chair.  No conversational dyspnea. HEAD: Normocephalic, atraumatic.  EYES: Pupils equal, round, reactive to light.  No scleral icterus.  MOUTH: Edentulous, oral mucosa moist.  No thrush. NECK: Supple. No thyromegaly. Trachea midline. No JVD.  No adenopathy. PULMONARY: Good air entry bilaterally.  Scattered rhonchi and wheezes, no rales. CARDIOVASCULAR: S1 and S2. Regular rate and rhythm.  Faint diastolic murmur at the cardiac apex. ABDOMEN: Protuberant, otherwise benign. MUSCULOSKELETAL: No joint deformity, no clubbing, no edema.  NEUROLOGIC: Unsteady gait.  No overt focal deficit. SKIN: Intact,warm,dry. PSYCH: Mood and behavior normal     Assessment & Plan:     ICD-10-CM   1. Non-small cell cancer of right lung (HCC)  C34.91 Ambulatory referral to Radiation Oncology   By clinical impression    2. Lung nodule  R91.1     3. Chronic obstructive pulmonary disease, unspecified COPD type (HCC)  J44.9    Suspect advanced PFTs pending    4. Nonrheumatic mitral valve regurgitation  I34.0     5. Chronic systolic heart failure (HCC)  Z61.09      Orders Placed This Encounter  Procedures   Ambulatory referral to Radiation Oncology    Referral Priority:   Routine    Referral Type:   Consultation    Referral Reason:   Specialty Services Required    Requested Specialty:   Radiation Oncology    Number of Visits Requested:   1   Meds ordered this encounter  Medications   budeson-glycopyrrolate-formoterol (BREZTRI AEROSPHERE) 160-9-4.8 MCG/ACT AERO    Sig: Inhale 2 puffs into the lungs in the morning and at bedtime.    Dispense:  2 each    Refill:  0    Lot Number?:   [id number]    Expiration  Date?:   05/01/2026    Manufacturer?:   [employer] [[id number]]    NDC:   8119-1478-29 [562130]    Quantity:   2   Discussion:    Pulmonary nodule She has a PET avid pulmonary nodule requiring treatment. Due to cardiac conditions, including a cardiomyopathy and mitral regurgitation, poor conditioning, she is not a candidate for surgery under general anesthesia.  Patient  prefers noninvasive approach and would like to consider SBRT. - Refer to Dr. Rushie Chestnut for SBRT.  Chronic obstructive pulmonary disease (COPD) She has COPD and is currently using Bevespi effectively. Wheezing may be exacerbated by allergies. Breztri, containing an additional ingredient, was suggested to potentially improve symptoms. - Provide samples of Breztri for trial. - Instruct to rinse mouth well after use to prevent thrush.  Smoking She smokes about a pack a day, contributing to respiratory issues. Reducing smoking is advised to improve respiratory health. - Advise to reduce smoking.  Cardiac issues She has a cardiomyopathy and mitral regurgitation, which have worsened, increasing anesthesia risk.  There regurgitation is more pronounced, necessitating careful treatment consideration. - Continue follow-up with cardiology   Advised if symptoms do not improve or worsen, to please contact office for sooner follow up or seek emergency care.    I spent 40 minutes of dedicated to the care of this patient on the date of this encounter to include pre-visit review of records, face-to-face time with the patient discussing conditions above, post visit ordering of testing, clinical documentation with the electronic health record, making appropriate referrals as documented, and communicating necessary findings to members of the patients care team.     C. Danice Goltz, MD Advanced Bronchoscopy PCCM [facility]    *This note was generated using voice recognition software/Dragon and/or AI transcription program.   Despite best efforts to proofread, errors can occur which can change the meaning. Any transcriptional errors that result from this process are unintentional and may not be fully corrected at the time of dictation.

## 2023-11-11 NOTE — Patient Instructions (Signed)
 VISIT SUMMARY:  During today's visit, we discussed your long-standing lung nodule, respiratory issues, and cardiac conditions. We also reviewed your smoking habits and their impact on your health. A new treatment plan was established to address these concerns.  YOUR PLAN:  -PULMONARY NODULE: A pulmonary nodule is a small growth in the lung. Due to your heart conditions, surgery is not an option, so we will proceed with low-dose radiation therapy to minimize risks. You will have five sessions of this therapy, and you will be referred to Dr. Rushie Chestnut for this treatment.  -CHRONIC OBSTRUCTIVE PULMONARY DISEASE (COPD): COPD is a chronic lung disease that makes it hard to breathe. You are currently using Bevespi, which is effective. We will provide you with samples of Breztri,2 puffs twice a day, to see if it improves your symptoms further. Please remember to rinse your mouth well after using it to prevent thrush.  -SMOKING: Smoking contributes to your respiratory issues and overall health risks. Reducing your smoking is strongly advised to improve your respiratory health.  -CARDIAC ISSUES: You have a weak heart and a leaky valve, which have worsened over time. These conditions increase your risk during procedures requiring anesthesia. We will reschedule your breathing test as previously planned.  INSTRUCTIONS:  Please follow up with Dr. Rushie Chestnut for your low-dose radiation therapy sessions. Additionally, ensure that your breathing test is rescheduled for April as planned.

## 2023-11-16 ENCOUNTER — Encounter: Payer: Self-pay | Admitting: Family Medicine

## 2023-11-22 ENCOUNTER — Ambulatory Visit: Payer: Self-pay | Admitting: Family Medicine

## 2023-11-22 NOTE — Telephone Encounter (Signed)
 Chief Complaint: Oral thrush Symptoms: Tongue burning, white patches, pain with eating, sore throat Frequency: Ongoing x1 week Pertinent Negatives: Patient denies fever Disposition: [x] Appointment (virtual)  Additional Notes: Pt states she is using 3 different inhalers. Pt states Dr. Jayme Cloud started pt on a new inhaler that is stronger. Pt now is having symptoms of oral thrush. This RN scheduled pt for a virtual appt with PCP tomorrow. This RN educated pt on home care, new-worsening symptoms, when to call back/seek emergent care. Pt verbalized understanding and agrees to plan.    Copied from CRM [id number]. Topic: Clinical - Medication Question >> Nov 22, 2023 11:51 AM Izetta Dakin wrote: Reason for CRM: Patient states that she previously had a medication called in for "thrush" and would like to have the medication called in again. Patient is unsure of the name of the medication. Reason for Disposition  [1] White patches that stick to tongue or inner cheek AND [2] can be wiped off  Answer Assessment - Initial Assessment Questions Tongue burning, tongue is white, white on cheeks About a week Pain with eating  Protocols used: Mouth Symptoms-A-AH

## 2023-11-23 ENCOUNTER — Telehealth (INDEPENDENT_AMBULATORY_CARE_PROVIDER_SITE_OTHER): Admitting: Family Medicine

## 2023-11-23 ENCOUNTER — Telehealth: Admitting: Family Medicine

## 2023-11-23 ENCOUNTER — Encounter: Payer: Self-pay | Admitting: Radiation Oncology

## 2023-11-23 ENCOUNTER — Ambulatory Visit
Admission: RE | Admit: 2023-11-23 | Discharge: 2023-11-23 | Disposition: A | Discharge status: Home / Self Care | Source: Ambulatory Visit | Attending: Radiation Oncology | Admitting: Radiation Oncology

## 2023-11-23 VITALS — BP 128/72 | HR 65 | Temp 97.2°F | Resp 20

## 2023-11-23 DIAGNOSIS — R911 Solitary pulmonary nodule: Secondary | ICD-10-CM | POA: Diagnosis not present

## 2023-11-23 DIAGNOSIS — J449 Chronic obstructive pulmonary disease, unspecified: Secondary | ICD-10-CM | POA: Insufficient documentation

## 2023-11-23 DIAGNOSIS — I5022 Chronic systolic (congestive) heart failure: Secondary | ICD-10-CM | POA: Diagnosis not present

## 2023-11-23 DIAGNOSIS — Z79899 Other long term (current) drug therapy: Secondary | ICD-10-CM | POA: Diagnosis not present

## 2023-11-23 DIAGNOSIS — Z5321 Procedure and treatment not carried out due to patient leaving prior to being seen by health care provider: Secondary | ICD-10-CM

## 2023-11-23 DIAGNOSIS — F17210 Nicotine dependence, cigarettes, uncomplicated: Secondary | ICD-10-CM | POA: Insufficient documentation

## 2023-11-23 DIAGNOSIS — Z7902 Long term (current) use of antithrombotics/antiplatelets: Secondary | ICD-10-CM | POA: Diagnosis not present

## 2023-11-23 DIAGNOSIS — Z7982 Long term (current) use of aspirin: Secondary | ICD-10-CM | POA: Insufficient documentation

## 2023-11-23 DIAGNOSIS — C3411 Malignant neoplasm of upper lobe, right bronchus or lung: Secondary | ICD-10-CM | POA: Diagnosis not present

## 2023-11-23 NOTE — Consult Note (Signed)
 NEW PATIENT EVALUATION  Name: Sarah White  MRN: [id number]  Date:   11/23/2023     DOB: 1946-03-19   This 78 y.o. female patient presents to the clinic for initial evaluation of 6 clinical stage I A3 (cT1 cN0 M0) non-small cell lung cancer of the right lung.  REFERRING PHYSICIAN: Salena Saner, MD  CHIEF COMPLAINT:  Chief Complaint  Patient presents with   Lung Cancer    DIAGNOSIS: The encounter diagnosis was Solitary pulmonary nodule.   PREVIOUS INVESTIGATIONS:  CT scans PET CT scans reviewed Clinical notes reviewed Labs reviewed  HPI: Patient is a 78 year old female with significant COPD mitral valve stenosis chronic systolic heart failure and longtime smoking history.  She presented on screening CT scan with multiple lung nodules the largest being a 2.2 cm lesion of the right lung worrisome for malignancy.  No evidence of mediastinal or hilar adenopathy was appreciated.  PET scan showed hypermetabolic activity of the 2.2 cm right upper lobe nodule concerning for malignancy.  She also had some subsolid subpleural nodularity medially in the right lower lobe.  She is fairly symptomatic has some white spots phlegm on coughing which she attributes to seasonal allergies.  She is having no dysphagia no weight loss.  She was not a candidate for bronchoscopy by Dr. Jayme Cloud based on her poor pulmonary function.  She also is a nonsurgical candidate based on her multiple medical comorbidities.  She is seen today for radiation oncology opinion.  PLANNED TREATMENT REGIMEN: SBRT  PAST MEDICAL HISTORY:  has a past medical history of Acute pancreatitis (09/09/2018), Acute respiratory failure with hypoxia and hypercarbia (HCC) (07/22/2015), Cancer (HCC), CHF (congestive heart failure) (HCC), COPD (chronic obstructive pulmonary disease) (HCC), Diabetes mellitus without complication (HCC), Encephalopathy acute (07/22/2015), Gastric ulcer without hemorrhage or perforation, Hypertension, Pneumonia,  Pulmonary edema (07/22/2015), and Squamous cell cancer of skin of upper arm, right.    PAST SURGICAL HISTORY:  Past Surgical History:  Procedure Laterality Date   CAROTID PTA/STENT INTERVENTION Right 03/06/2020   Procedure: CAROTID PTA/STENT INTERVENTION;  Surgeon: Renford Dills, MD;  Location: [facility];  Service: Cardiovascular;  Laterality: Right;   CARPAL TUNNEL RELEASE Bilateral    CESAREAN SECTION     CHOLECYSTECTOMY N/A 09/12/2018   Procedure: LAPAROSCOPIC CHOLECYSTECTOMY WITH INTRAOPERATIVE CHOLANGIOGRAM;  Surgeon: Carolan Shiver, MD;  Location: [facility] ORS;  Service: General;  Laterality: N/A;   COLONOSCOPY WITH PROPOFOL N/A 09/10/2021   Procedure: COLONOSCOPY WITH PROPOFOL;  Surgeon: Toney Reil, MD;  Location: [facility] ENDOS[facility];  Service: Gastroenterology;  Laterality: N/A;   CORONARY ANGIOPLASTY WITH STENT PLACEMENT     ESOPHAGOGASTRODUODENOSCOPY N/A 09/10/2021   Procedure: ESOPHAGOGASTRODUODENOSCOPY (EGD);  Surgeon: Toney Reil, MD;  Location: [facility] ENDOS[facility];  Service: Gastroenterology;  Laterality: N/A;   KNEE SURGERY Left    LEFT HEART CATH AND CORONARY ANGIOGRAPHY Right 11/26/2016   Procedure: Left Heart Cath and Coronary Angiography;  Surgeon: Marcina Millard, MD;  Location: [facility];  Service: Cardiovascular;  Laterality: Right;   LEFT HEART CATH AND CORONARY ANGIOGRAPHY N/A 01/23/2020   Procedure: LEFT HEART CATH AND CORONARY ANGIOGRAPHY;  Surgeon: Alwyn Pea, MD;  Location: [facility];  Service: Cardiovascular;  Laterality: N/A;   TOTAL ABDOMINAL HYSTERECTOMY      FAMILY HISTORY: family history includes Heart disease in her mother.  SOCIAL HISTORY:  reports that she has been smoking cigarettes. She started smoking about 65 years ago. She has a 65.2 pack-year smoking history. She  has never used smokeless tobacco. She reports that she does not drink alcohol and does not use drugs.  ALLERGIES: Amlodipine,  Atorvastatin, Codeine, Hctz [hydrochlorothiazide], Lisinopril, and Metformin and related  MEDICATIONS:  Current Outpatient Medications  Medication Sig Dispense Refill   aspirin EC 81 MG tablet Take 81 mg by mouth at bedtime.      budeson-glycopyrrolate-formoterol (BREZTRI AEROSPHERE) 160-9-4.8 MCG/ACT AERO Inhale 2 puffs into the lungs in the morning and at bedtime. 2 each 0   buPROPion (WELLBUTRIN XL) 300 MG 24 hr tablet Take 1 tablet (300 mg total) by mouth daily. 90 tablet 0   carvedilol (COREG) 12.5 MG tablet Take 1 tablet (12.5 mg total) by mouth 2 (two) times daily with a meal. 180 tablet 0   clopidogrel (PLAVIX) 75 MG tablet Take 1 tablet (75 mg total) by mouth daily at 6 (six) AM. 90 tablet 0   diphenhydrAMINE (BENADRYL) 25 MG tablet Take 25 mg by mouth at bedtime as needed for allergies. Dies not take regularly     furosemide (LASIX) 40 MG tablet Take 1 tablet (40 mg total) by mouth daily. 90 tablet 0   Glycopyrrolate-Formoterol (BEVESPI AEROSPHERE) 9-4.8 MCG/ACT AERO Inhale 2 puffs into the lungs 2 (two) times daily.     lidocaine (LIDODERM) 5 % Place 1 patch onto the skin daily. Remove & Discard patch within 12 hours or as directed by MD 30 patch 12   nitroGLYCERIN (NITROSTAT) 0.4 MG SL tablet DISSOLVE ONE TABLET UNDER THE TONGUE EVERY 5 MINUTES AS NEEDED FOR CHEST PAIN.  DO NOT EXCEED A TOTAL OF 3 DOSES IN 15 MINUTES 25 tablet 0   ondansetron (ZOFRAN) 8 MG tablet Take 1 tablet (8 mg total) by mouth every 8 (eight) hours as needed for nausea or vomiting. 60 tablet 3   pantoprazole (PROTONIX) 40 MG tablet Take 1 tablet (40 mg total) by mouth 2 (two) times daily before a meal. 180 tablet 0   promethazine (PHENERGAN) 25 MG suppository Place 1 suppository (25 mg total) rectally every 6 (six) hours as needed for nausea or vomiting. 60 each 3   QUEtiapine (SEROQUEL XR) 50 MG TB24 24 hr tablet Take 2 tablets (100 mg total) by mouth at bedtime. 180 tablet 0   rosuvastatin (CRESTOR) 20 MG tablet  Take 1 tablet (20 mg total) by mouth daily. 90 tablet 0   sertraline (ZOLOFT) 100 MG tablet Take 2 tablets (200 mg total) by mouth daily. 180 tablet 0   traZODone (DESYREL) 50 MG tablet Take 1 tablet (50 mg total) by mouth at bedtime as needed. for sleep 90 tablet 0   triamcinolone ointment (KENALOG) 0.5 % Apply 1 application topically 2 (two) times daily. 30 g 3   albuterol (VENTOLIN HFA) 108 (90 Base) MCG/ACT inhaler Inhale 2 puffs into the lungs every 6 (six) hours as needed for wheezing or shortness of breath. (Patient not taking: Reported on 11/23/2023) 18 g 0   amiodarone (PACERONE) 200 MG tablet Take 0.5 tablets by mouth daily. (Patient not taking: Reported on 11/23/2023)     cyclobenzaprine (FLEXERIL) 10 MG tablet Take 1 tablet by mouth three times daily as needed for muscle spasm (Patient not taking: Reported on 11/23/2023) 30 tablet 0   sacubitril-valsartan (ENTRESTO) 49-51 MG Take 1 tablet by mouth 2 (two) times daily.  (Patient not taking: Reported on 09/20/2023)     No current facility-administered medications for this encounter.    ECOG PERFORMANCE STATUS:  0 - Asymptomatic  REVIEW OF SYSTEMS: Patient  has significant history of COPD, mitral valve stenosis chronic systolic heart failure currently on Plavix Patient denies any weight loss, fatigue, weakness, fever, chills or night sweats. Patient denies any loss of vision, blurred vision. Patient denies any ringing  of the ears or hearing loss. No irregular heartbeat. Patient denies heart murmur or history of fainting. Patient denies any chest pain or pain radiating to her upper extremities. Patient denies any shortness of breath, difficulty breathing at night, cough or hemoptysis. Patient denies any swelling in the lower legs. Patient denies any nausea vomiting, vomiting of blood, or coffee ground material in the vomitus. Patient denies any stomach pain. Patient states has had normal bowel movements no significant constipation or diarrhea.  Patient denies any dysuria, hematuria or significant nocturia. Patient denies any problems walking, swelling in the joints or loss of balance. Patient denies any skin changes, loss of hair or loss of weight. Patient denies any excessive worrying or anxiety or significant depression. Patient denies any problems with insomnia. Patient denies excessive thirst, polyuria, polydipsia. Patient denies any swollen glands, patient denies easy bruising or easy bleeding. Patient denies any recent infections, allergies or URI. Patient "s visual fields have not changed significantly in recent time.   PHYSICAL EXAM: BP 128/72   Pulse 65   Temp (!) 97.2 F (36.2 C) (Tympanic)   Resp 20  Elderly female frail in NAD.  Well-developed well-nourished patient in NAD. HEENT reveals PERLA, EOMI, discs not visualized.  Oral cavity is clear. No oral mucosal lesions are identified. Neck is clear without evidence of cervical or supraclavicular adenopathy. Lungs are clear to A&P. Cardiac examination is essentially unremarkable with regular rate and rhythm without murmur rub or thrill. Abdomen is benign with no organomegaly or masses noted. Motor sensory and DTR levels are equal and symmetric in the upper and lower extremities. Cranial nerves II through XII are grossly intact. Proprioception is intact. No peripheral adenopathy or edema is identified. No motor or sensory levels are noted. Crude visual fields are within normal range.  LABORATORY DATA: Labs reviewed compatible with above-stated findings pulmonary function test reviewed    RADIOLOGY RESULTS: CT scan and PET scan reviewed compatible with above-stated findings   IMPRESSION: Stage I A3 non-small cell lung cancer of the right upper lobe in 78 year old female with multiple medical comorbidities.  PLAN: This time of recommended SBRT to her right upper lobe lesion.  Will plan on delivering 60 Gray in 5 fractions.  Risks and benefits of treatment occluding extremely low  side effect profile possible development of a cough about a month out from treatment as well as slight fatigue.  I have assured her the overall survival at 5 years is equal to surgery in these type of lesions.  Patient and her son both comprehend my recommendations well have consented to treatment.  I have personally set up and ordered CT simulation for early next week.  Will use motion restriction as well as 3-dimensional treatment planning.  I would like to take this opportunity to thank you for allowing me to participate in the care of your patient.Carmina Miller, MD

## 2023-11-23 NOTE — Progress Notes (Signed)
Unable to connect to video visit

## 2023-11-23 NOTE — Progress Notes (Signed)
 Patient unable to do a video visit. Hung up on CMA when rooming her. If she calls back- will need to be seen to be evaluated for thrush

## 2023-11-24 DIAGNOSIS — C3411 Malignant neoplasm of upper lobe, right bronchus or lung: Secondary | ICD-10-CM | POA: Diagnosis not present

## 2023-11-24 DIAGNOSIS — F17210 Nicotine dependence, cigarettes, uncomplicated: Secondary | ICD-10-CM | POA: Diagnosis not present

## 2023-11-30 ENCOUNTER — Ambulatory Visit
Admission: RE | Admit: 2023-11-30 | Discharge: 2023-11-30 | Disposition: A | Discharge status: Home / Self Care | Source: Ambulatory Visit | Attending: Radiation Oncology | Admitting: Radiation Oncology

## 2023-11-30 DIAGNOSIS — F17210 Nicotine dependence, cigarettes, uncomplicated: Secondary | ICD-10-CM | POA: Diagnosis not present

## 2023-11-30 DIAGNOSIS — C3411 Malignant neoplasm of upper lobe, right bronchus or lung: Secondary | ICD-10-CM | POA: Diagnosis not present

## 2023-11-30 DIAGNOSIS — R911 Solitary pulmonary nodule: Secondary | ICD-10-CM | POA: Insufficient documentation

## 2023-11-30 DIAGNOSIS — Z7902 Long term (current) use of antithrombotics/antiplatelets: Secondary | ICD-10-CM | POA: Insufficient documentation

## 2023-11-30 DIAGNOSIS — J449 Chronic obstructive pulmonary disease, unspecified: Secondary | ICD-10-CM | POA: Insufficient documentation

## 2023-11-30 DIAGNOSIS — Z510 Encounter for antineoplastic radiation therapy: Secondary | ICD-10-CM | POA: Diagnosis not present

## 2023-11-30 DIAGNOSIS — Z79899 Other long term (current) drug therapy: Secondary | ICD-10-CM | POA: Insufficient documentation

## 2023-11-30 DIAGNOSIS — I5022 Chronic systolic (congestive) heart failure: Secondary | ICD-10-CM | POA: Insufficient documentation

## 2023-11-30 DIAGNOSIS — Z7982 Long term (current) use of aspirin: Secondary | ICD-10-CM | POA: Insufficient documentation

## 2023-12-02 DIAGNOSIS — Z510 Encounter for antineoplastic radiation therapy: Secondary | ICD-10-CM | POA: Diagnosis not present

## 2023-12-02 DIAGNOSIS — C3411 Malignant neoplasm of upper lobe, right bronchus or lung: Secondary | ICD-10-CM | POA: Diagnosis not present

## 2023-12-02 DIAGNOSIS — F17210 Nicotine dependence, cigarettes, uncomplicated: Secondary | ICD-10-CM | POA: Diagnosis not present

## 2023-12-13 ENCOUNTER — Ambulatory Visit
Admission: RE | Admit: 2023-12-13 | Discharge: 2023-12-13 | Disposition: A | Discharge status: Home / Self Care | Source: Ambulatory Visit | Attending: Radiation Oncology | Admitting: Radiation Oncology

## 2023-12-13 ENCOUNTER — Other Ambulatory Visit: Payer: Self-pay

## 2023-12-13 DIAGNOSIS — F17210 Nicotine dependence, cigarettes, uncomplicated: Secondary | ICD-10-CM | POA: Diagnosis not present

## 2023-12-13 DIAGNOSIS — C3411 Malignant neoplasm of upper lobe, right bronchus or lung: Secondary | ICD-10-CM | POA: Diagnosis not present

## 2023-12-13 DIAGNOSIS — Z510 Encounter for antineoplastic radiation therapy: Secondary | ICD-10-CM | POA: Diagnosis not present

## 2023-12-13 LAB — RAD ONC ARIA SESSION SUMMARY
Course Elapsed Days: 0
Plan Fractions Treated to Date: 1
Plan Prescribed Dose Per Fraction: 6 Gy
Plan Total Fractions Prescribed: 10
Plan Total Prescribed Dose: 60 Gy
Reference Point Dosage Given to Date: 6 Gy
Reference Point Session Dosage Given: 6 Gy
Session Number: 1

## 2023-12-14 ENCOUNTER — Ambulatory Visit
Admission: RE | Admit: 2023-12-14 | Discharge: 2023-12-14 | Disposition: A | Discharge status: Home / Self Care | Source: Ambulatory Visit | Attending: Radiation Oncology | Admitting: Radiation Oncology

## 2023-12-14 ENCOUNTER — Other Ambulatory Visit: Payer: Self-pay

## 2023-12-14 DIAGNOSIS — Z510 Encounter for antineoplastic radiation therapy: Secondary | ICD-10-CM | POA: Diagnosis not present

## 2023-12-14 DIAGNOSIS — C3411 Malignant neoplasm of upper lobe, right bronchus or lung: Secondary | ICD-10-CM | POA: Diagnosis not present

## 2023-12-14 DIAGNOSIS — F17210 Nicotine dependence, cigarettes, uncomplicated: Secondary | ICD-10-CM | POA: Diagnosis not present

## 2023-12-14 LAB — RAD ONC ARIA SESSION SUMMARY
Course Elapsed Days: 1
Plan Fractions Treated to Date: 2
Plan Prescribed Dose Per Fraction: 6 Gy
Plan Total Fractions Prescribed: 10
Plan Total Prescribed Dose: 60 Gy
Reference Point Dosage Given to Date: 12 Gy
Reference Point Session Dosage Given: 6 Gy
Session Number: 2

## 2023-12-15 ENCOUNTER — Ambulatory Visit

## 2023-12-16 ENCOUNTER — Ambulatory Visit
Admission: RE | Admit: 2023-12-16 | Discharge: 2023-12-16 | Disposition: A | Discharge status: Home / Self Care | Source: Ambulatory Visit | Attending: Radiation Oncology | Admitting: Radiation Oncology

## 2023-12-16 ENCOUNTER — Other Ambulatory Visit: Payer: Self-pay

## 2023-12-16 DIAGNOSIS — F17210 Nicotine dependence, cigarettes, uncomplicated: Secondary | ICD-10-CM | POA: Diagnosis not present

## 2023-12-16 DIAGNOSIS — Z510 Encounter for antineoplastic radiation therapy: Secondary | ICD-10-CM | POA: Diagnosis not present

## 2023-12-16 DIAGNOSIS — C3411 Malignant neoplasm of upper lobe, right bronchus or lung: Secondary | ICD-10-CM | POA: Diagnosis not present

## 2023-12-16 LAB — RAD ONC ARIA SESSION SUMMARY
Course Elapsed Days: 3
Plan Fractions Treated to Date: 3
Plan Prescribed Dose Per Fraction: 6 Gy
Plan Total Fractions Prescribed: 10
Plan Total Prescribed Dose: 60 Gy
Reference Point Dosage Given to Date: 18 Gy
Reference Point Session Dosage Given: 6 Gy
Session Number: 3

## 2023-12-17 ENCOUNTER — Ambulatory Visit
Admission: RE | Admit: 2023-12-17 | Discharge: 2023-12-17 | Disposition: A | Discharge status: Home / Self Care | Source: Ambulatory Visit | Attending: Radiation Oncology | Admitting: Radiation Oncology

## 2023-12-17 ENCOUNTER — Other Ambulatory Visit: Payer: Self-pay

## 2023-12-17 DIAGNOSIS — F17210 Nicotine dependence, cigarettes, uncomplicated: Secondary | ICD-10-CM | POA: Diagnosis not present

## 2023-12-17 DIAGNOSIS — C3411 Malignant neoplasm of upper lobe, right bronchus or lung: Secondary | ICD-10-CM | POA: Diagnosis not present

## 2023-12-17 DIAGNOSIS — Z510 Encounter for antineoplastic radiation therapy: Secondary | ICD-10-CM | POA: Diagnosis not present

## 2023-12-17 LAB — RAD ONC ARIA SESSION SUMMARY
Course Elapsed Days: 4
Plan Fractions Treated to Date: 4
Plan Prescribed Dose Per Fraction: 6 Gy
Plan Total Fractions Prescribed: 10
Plan Total Prescribed Dose: 60 Gy
Reference Point Dosage Given to Date: 24 Gy
Reference Point Session Dosage Given: 6 Gy
Session Number: 4

## 2023-12-20 ENCOUNTER — Ambulatory Visit

## 2023-12-20 ENCOUNTER — Ambulatory Visit: Payer: Self-pay | Admitting: Family Medicine

## 2023-12-21 ENCOUNTER — Ambulatory Visit

## 2023-12-22 ENCOUNTER — Ambulatory Visit
Admission: RE | Admit: 2023-12-22 | Discharge: 2023-12-22 | Disposition: A | Discharge status: Home / Self Care | Source: Ambulatory Visit | Attending: Radiation Oncology | Admitting: Radiation Oncology

## 2023-12-22 ENCOUNTER — Other Ambulatory Visit: Payer: Self-pay

## 2023-12-22 ENCOUNTER — Ambulatory Visit

## 2023-12-22 DIAGNOSIS — Z510 Encounter for antineoplastic radiation therapy: Secondary | ICD-10-CM | POA: Diagnosis not present

## 2023-12-22 DIAGNOSIS — C3411 Malignant neoplasm of upper lobe, right bronchus or lung: Secondary | ICD-10-CM | POA: Diagnosis not present

## 2023-12-22 DIAGNOSIS — F17210 Nicotine dependence, cigarettes, uncomplicated: Secondary | ICD-10-CM | POA: Diagnosis not present

## 2023-12-22 LAB — RAD ONC ARIA SESSION SUMMARY
Course Elapsed Days: 9
Plan Fractions Treated to Date: 5
Plan Prescribed Dose Per Fraction: 6 Gy
Plan Total Fractions Prescribed: 10
Plan Total Prescribed Dose: 60 Gy
Reference Point Dosage Given to Date: 30 Gy
Reference Point Session Dosage Given: 6 Gy
Session Number: 5

## 2023-12-23 ENCOUNTER — Other Ambulatory Visit: Payer: Self-pay

## 2023-12-23 ENCOUNTER — Ambulatory Visit
Admission: RE | Admit: 2023-12-23 | Discharge: 2023-12-23 | Disposition: A | Discharge status: Home / Self Care | Source: Ambulatory Visit | Attending: Radiation Oncology | Admitting: Radiation Oncology

## 2023-12-23 DIAGNOSIS — Z510 Encounter for antineoplastic radiation therapy: Secondary | ICD-10-CM | POA: Diagnosis not present

## 2023-12-23 DIAGNOSIS — F17210 Nicotine dependence, cigarettes, uncomplicated: Secondary | ICD-10-CM | POA: Diagnosis not present

## 2023-12-23 DIAGNOSIS — C3411 Malignant neoplasm of upper lobe, right bronchus or lung: Secondary | ICD-10-CM | POA: Diagnosis not present

## 2023-12-23 LAB — RAD ONC ARIA SESSION SUMMARY
Course Elapsed Days: 10
Plan Fractions Treated to Date: 6
Plan Prescribed Dose Per Fraction: 6 Gy
Plan Total Fractions Prescribed: 10
Plan Total Prescribed Dose: 60 Gy
Reference Point Dosage Given to Date: 36 Gy
Reference Point Session Dosage Given: 6 Gy
Session Number: 6

## 2023-12-24 ENCOUNTER — Other Ambulatory Visit: Payer: Self-pay

## 2023-12-24 ENCOUNTER — Ambulatory Visit
Admission: RE | Admit: 2023-12-24 | Discharge: 2023-12-24 | Disposition: A | Discharge status: Home / Self Care | Source: Ambulatory Visit | Attending: Radiation Oncology | Admitting: Radiation Oncology

## 2023-12-24 DIAGNOSIS — Z510 Encounter for antineoplastic radiation therapy: Secondary | ICD-10-CM | POA: Diagnosis not present

## 2023-12-24 DIAGNOSIS — C3411 Malignant neoplasm of upper lobe, right bronchus or lung: Secondary | ICD-10-CM | POA: Diagnosis not present

## 2023-12-24 DIAGNOSIS — F17210 Nicotine dependence, cigarettes, uncomplicated: Secondary | ICD-10-CM | POA: Diagnosis not present

## 2023-12-24 LAB — RAD ONC ARIA SESSION SUMMARY
Course Elapsed Days: 11
Plan Fractions Treated to Date: 7
Plan Prescribed Dose Per Fraction: 6 Gy
Plan Total Fractions Prescribed: 10
Plan Total Prescribed Dose: 60 Gy
Reference Point Dosage Given to Date: 42 Gy
Reference Point Session Dosage Given: 6 Gy
Session Number: 7

## 2023-12-27 ENCOUNTER — Ambulatory Visit

## 2023-12-27 ENCOUNTER — Other Ambulatory Visit: Payer: Self-pay

## 2023-12-27 ENCOUNTER — Ambulatory Visit
Admission: RE | Admit: 2023-12-27 | Discharge: 2023-12-27 | Disposition: A | Discharge status: Home / Self Care | Source: Ambulatory Visit | Attending: Radiation Oncology | Admitting: Radiation Oncology

## 2023-12-27 ENCOUNTER — Other Ambulatory Visit: Payer: Self-pay | Admitting: *Deleted

## 2023-12-27 DIAGNOSIS — C3411 Malignant neoplasm of upper lobe, right bronchus or lung: Secondary | ICD-10-CM | POA: Diagnosis not present

## 2023-12-27 DIAGNOSIS — Z510 Encounter for antineoplastic radiation therapy: Secondary | ICD-10-CM | POA: Diagnosis not present

## 2023-12-27 DIAGNOSIS — F17210 Nicotine dependence, cigarettes, uncomplicated: Secondary | ICD-10-CM | POA: Diagnosis not present

## 2023-12-27 LAB — RAD ONC ARIA SESSION SUMMARY
Course Elapsed Days: 14
Plan Fractions Treated to Date: 8
Plan Prescribed Dose Per Fraction: 6 Gy
Plan Total Fractions Prescribed: 10
Plan Total Prescribed Dose: 60 Gy
Reference Point Dosage Given to Date: 48 Gy
Reference Point Session Dosage Given: 6 Gy
Session Number: 8

## 2023-12-27 MED ORDER — DEXAMETHASONE 2 MG PO TABS: 2.0000 mg | ORAL_TABLET | Freq: Two times a day (BID) | ORAL | 0 refills | Status: DC

## 2023-12-28 ENCOUNTER — Ambulatory Visit
Admission: RE | Admit: 2023-12-28 | Discharge: 2023-12-28 | Disposition: A | Discharge status: Home / Self Care | Source: Ambulatory Visit | Attending: Radiation Oncology | Admitting: Radiation Oncology

## 2023-12-28 ENCOUNTER — Ambulatory Visit

## 2023-12-28 ENCOUNTER — Other Ambulatory Visit: Payer: Self-pay

## 2023-12-28 DIAGNOSIS — F17210 Nicotine dependence, cigarettes, uncomplicated: Secondary | ICD-10-CM | POA: Diagnosis not present

## 2023-12-28 DIAGNOSIS — Z510 Encounter for antineoplastic radiation therapy: Secondary | ICD-10-CM | POA: Diagnosis not present

## 2023-12-28 DIAGNOSIS — C3411 Malignant neoplasm of upper lobe, right bronchus or lung: Secondary | ICD-10-CM | POA: Diagnosis not present

## 2023-12-28 LAB — RAD ONC ARIA SESSION SUMMARY
Course Elapsed Days: 15
Plan Fractions Treated to Date: 9
Plan Prescribed Dose Per Fraction: 6 Gy
Plan Total Fractions Prescribed: 10
Plan Total Prescribed Dose: 60 Gy
Reference Point Dosage Given to Date: 54 Gy
Reference Point Session Dosage Given: 6 Gy
Session Number: 9

## 2023-12-29 ENCOUNTER — Ambulatory Visit
Admission: RE | Admit: 2023-12-29 | Discharge: 2023-12-29 | Disposition: A | Discharge status: Home / Self Care | Source: Ambulatory Visit | Attending: Radiation Oncology | Admitting: Radiation Oncology

## 2023-12-29 ENCOUNTER — Other Ambulatory Visit: Payer: Self-pay

## 2023-12-29 DIAGNOSIS — Z510 Encounter for antineoplastic radiation therapy: Secondary | ICD-10-CM | POA: Diagnosis not present

## 2023-12-29 DIAGNOSIS — F17210 Nicotine dependence, cigarettes, uncomplicated: Secondary | ICD-10-CM | POA: Diagnosis not present

## 2023-12-29 DIAGNOSIS — C3411 Malignant neoplasm of upper lobe, right bronchus or lung: Secondary | ICD-10-CM | POA: Diagnosis not present

## 2023-12-29 LAB — RAD ONC ARIA SESSION SUMMARY
Course Elapsed Days: 16
Plan Fractions Treated to Date: 10
Plan Prescribed Dose Per Fraction: 6 Gy
Plan Total Fractions Prescribed: 10
Plan Total Prescribed Dose: 60 Gy
Reference Point Dosage Given to Date: 60 Gy
Reference Point Session Dosage Given: 6 Gy
Session Number: 10

## 2023-12-30 ENCOUNTER — Encounter: Payer: Self-pay | Admitting: Family Medicine

## 2023-12-30 NOTE — Radiation Completion Notes (Signed)
 Patient Name: Sarah White, SCHORN MRN: [id number] Date of Birth: May 02, 1946 Referring Physician: Coralie Derrick, M.D. Date of Service: 2023-12-30 Radiation Oncologist: Glenis Langdon, M.D. [facility] -                              RADIATION ONCOLOGY END OF TREATMENT NOTE     Diagnosis: R91.1 Solitary pulmonary nodule Intent: Curative     HPI: Patient is a 78 year old female with significant COPD mitral valve stenosis chronic systolic heart failure and longtime smoking history.  She presented on screening CT scan with multiple lung nodules the largest being a 2.2 cm lesion of the right lung worrisome for malignancy.  No evidence of mediastinal or hilar adenopathy was appreciated.  PET scan showed hypermetabolic activity of the 2.2 cm right upper lobe nodule concerning for malignancy.  She also had some subsolid subpleural nodularity medially in the right lower lobe.  She is fairly symptomatic has some white spots phlegm on coughing which she attributes to seasonal allergies.  She is having no dysphagia no weight loss.  She was not a candidate for bronchoscopy by Dr. Viva Grise based on her poor pulmonary function.  She also is a nonsurgical candidate based on her multiple medical comorbidities.  She is seen today for radiation oncology opinion.      ==========DELIVERED PLANS==========  First Treatment Date: 2023-12-13 Last Treatment Date: 2023-12-29   Plan Name: [id number] Site: Lung, Right Technique: IMRT Mode: Photon Dose Per Fraction: 6 Gy Prescribed Dose (Delivered / Prescribed): 60 Gy / 60 Gy Prescribed Fxs (Delivered / Prescribed): 10 / 10     ==========ON TREATMENT VISIT DATES========== 2023-12-14, 2023-12-27, 2023-12-28     ==========UPCOMING VISITS========== 01/31/2024 [facility] RAD ONCOLOGY FOLLOW UP 30 Glenis Langdon, MD  01/21/2024 [facility] PRACTICE OFFICE VISIT Solomon Dupre, DO  01/11/2024 [facility] OFFICE VISIT -  Jerral Moos, MD        ==========APPENDIX - ON TREATMENT VISIT NOTES==========   See weekly On Treatment Notes in Epic for details in the Media tab (listed as Progress notes on the On Treatment Visit Dates listed above).

## 2023-12-31 MED ORDER — NITROGLYCERIN 0.4 MG SL SUBL: 0.4000 mg | SUBLINGUAL_TABLET | SUBLINGUAL | 0 refills | Status: DC | PRN

## 2024-01-08 ENCOUNTER — Other Ambulatory Visit: Payer: Self-pay | Admitting: Family Medicine

## 2024-01-11 ENCOUNTER — Ambulatory Visit: Admitting: Pulmonary Disease

## 2024-01-11 ENCOUNTER — Telehealth: Payer: Self-pay

## 2024-01-11 NOTE — Telephone Encounter (Signed)
 Appt was rescheduled to 6/5.  Nothing further needed.

## 2024-01-11 NOTE — Telephone Encounter (Signed)
 Requested medication (s) are due for refill today:   Provider to review  Requested medication (s) are on the active medication list:   Yes  Future visit scheduled:   Yes 01/21/2024          Last ordered: 09/20/2023 #180, 0 refills  Non delegated refill     Requested Prescriptions  Pending Prescriptions Disp Refills   QUEtiapine  (SEROQUEL  XR) 50 MG TB24 24 hr tablet [Pharmacy Med Name: QUEtiapine  Fumarate ER 50 MG Oral Tablet Extended Release 24 Hour] 180 tablet 0    Sig: TAKE 2 TABLETS BY MOUTH AT BEDTIME     Not Delegated - Psychiatry:  Antipsychotics - Second Generation (Atypical) - quetiapine  Failed - 01/11/2024 10:52 AM      Failed - This refill cannot be delegated      Failed - Valid encounter within last 6 months    Recent Outpatient Visits           1 month ago    [facility] [place], [name], DO   1 month ago Patient left without being seen    [facility] [place] P, DO              Failed - Lipid Panel in normal range within the last 12 months    Cholesterol, Total  Date Value Ref Range Status  05/17/2023 161 100 - 199 mg/dL Final   Cholesterol  Date Value Ref Range Status  01/16/2014 258 (H) 0 - 200 mg/dL Final   Cholesterol Piccolo, Waived  Date Value Ref Range Status  02/04/2016 164 <200 mg/dL Final    Comment:                            Desirable                <200                         Borderline High      200- 239                         High                     >239    Ldl Cholesterol, Calc  Date Value Ref Range Status  01/16/2014 156 (H) 0 - 100 mg/dL Final   LDL Chol Calc (NIH)  Date Value Ref Range Status  05/17/2023 86 0 - 99 mg/dL Final   HDL Cholesterol  Date Value Ref Range Status  01/16/2014 35 (L) 40 - 60 mg/dL Final   HDL  Date Value Ref Range Status  05/17/2023 49 >39 mg/dL Final   Triglycerides  Date Value Ref Range Status  05/17/2023 153 (H) 0 - 149 mg/dL Final   16/06/9603 540 (H) 0 - 200 mg/dL Final   Triglycerides Piccolo,Waived  Date Value Ref Range Status  02/04/2016 258 (H) <150 mg/dL Final    Comment:                            Normal                   <150  Borderline High     150 - 199                         High                200 - 499                         Very High                >499          Passed - TSH in normal range and within 360 days    TSH  Date Value Ref Range Status  05/17/2023 2.530 0.450 - 4.500 uIU/mL Final         Passed - Completed PHQ-2 or PHQ-9 in the last 360 days      Passed - Last BP in normal range    BP Readings from Last 1 Encounters:  11/23/23 128/72         Passed - Last Heart Rate in normal range    Pulse Readings from Last 1 Encounters:  11/23/23 65         Passed - CBC within normal limits and completed in the last 12 months    WBC  Date Value Ref Range Status  05/17/2023 5.8 3.4 - 10.8 x10E3/uL Final  03/08/2020 4.6 4.0 - 10.5 K/uL Final   RBC  Date Value Ref Range Status  05/17/2023 4.75 3.77 - 5.28 x10E6/uL Final  03/08/2020 4.04 3.87 - 5.11 MIL/uL Final   Hemoglobin  Date Value Ref Range Status  05/17/2023 13.2 11.1 - 15.9 g/dL Final   Hematocrit  Date Value Ref Range Status  05/17/2023 41.3 34.0 - 46.6 % Final   MCHC  Date Value Ref Range Status  05/17/2023 32.0 31.5 - 35.7 g/dL Final  91/47/8295 62.1 30.0 - 36.0 g/dL Final   [facility]  Date Value Ref Range Status  05/17/2023 27.8 26.6 - 33.0 pg Final  03/08/2020 27.5 26.0 - 34.0 pg Final   MCV  Date Value Ref Range Status  05/17/2023 87 79 - 97 fL Final  01/16/2014 84 80 - 100 fL Final   No results found for: "PLTCOUNTKUC", "LABPLAT", "POCPLA" RDW  Date Value Ref Range Status  05/17/2023 14.1 11.7 - 15.4 % Final  01/16/2014 14.1 11.5 - 14.5 % Final         Passed - CMP within normal limits and completed in the last 12 months    Albumin  Date Value Ref Range Status  05/17/2023 4.2  3.8 - 4.8 g/dL Final  30/86/5784 3.7 3.4 - 5.0 g/dL Final   Alkaline Phosphatase  Date Value Ref Range Status  05/17/2023 115 44 - 121 IU/L Final  01/15/2014 125 (H) Unit/L Final    Comment:    45-117 NOTE: New Reference Range 07/21/13    ALT  Date Value Ref Range Status  05/17/2023 14 0 - 32 IU/L Final   SGPT (ALT)  Date Value Ref Range Status  01/15/2014 28 12 - 78 U/L Final   AST  Date Value Ref Range Status  05/17/2023 17 0 - 40 IU/L Final   SGOT(AST)  Date Value Ref Range Status  01/15/2014 27 15 - 37 Unit/L Final   BUN  Date Value Ref Range Status  05/17/2023 13 8 - 27 mg/dL Final  69/62/9528 11 7 - 18 mg/dL Final   Calcium   Date  Value Ref Range Status  05/17/2023 8.9 8.7 - 10.3 mg/dL Final   Calcium , Total  Date Value Ref Range Status  01/15/2014 9.2 8.5 - 10.1 mg/dL Final   CO2  Date Value Ref Range Status  05/17/2023 20 20 - 29 mmol/L Final   Co2  Date Value Ref Range Status  01/15/2014 26 21 - 32 mmol/L Final   Bicarbonate  Date Value Ref Range Status  12/03/2019 29.0 (H) 20.0 - 28.0 mmol/L Final   Creatinine  Date Value Ref Range Status  01/15/2014 0.52 (L) 0.60 - 1.30 mg/dL Final   Creatinine, Ser  Date Value Ref Range Status  05/17/2023 1.08 (H) 0.57 - 1.00 mg/dL Final   Glucose  Date Value Ref Range Status  05/17/2023 83 70 - 99 mg/dL Final  16/06/9603 540 (H) 65 - 99 mg/dL Final   Glucose, Bld  Date Value Ref Range Status  03/08/2020 118 (H) 70 - 99 mg/dL Final    Comment:    Glucose reference range applies only to samples taken after fasting for at least 8 hours.   Glucose-Capillary  Date Value Ref Range Status  09/29/2023 120 (H) 70 - 99 mg/dL Final    Comment:    Glucose reference range applies only to samples taken after fasting for at least 8 hours.   Potassium  Date Value Ref Range Status  05/17/2023 4.7 3.5 - 5.2 mmol/L Final  01/15/2014 4.2 3.5 - 5.1 mmol/L Final   Sodium  Date Value Ref Range Status   05/17/2023 142 134 - 144 mmol/L Final  01/15/2014 134 (L) 136 - 145 mmol/L Final   Bilirubin,Total  Date Value Ref Range Status  01/15/2014 0.4 0.2 - 1.0 mg/dL Final   Bilirubin Total  Date Value Ref Range Status  05/17/2023 0.3 0.0 - 1.2 mg/dL Final   Bilirubin, Direct  Date Value Ref Range Status  09/08/2018 0.3 (H) 0.0 - 0.2 mg/dL Final   Indirect Bilirubin  Date Value Ref Range Status  09/08/2018 0.6 0.3 - 0.9 mg/dL Final    Comment:    Performed at [facility], [address] [place], [place] [address]   Protein, ur  Date Value Ref Range Status  12/03/2019 100 (A) NEGATIVE mg/dL Final   Protein,UA  Date Value Ref Range Status  06/23/2021 Trace (A) Negative/Trace Final   Total Protein  Date Value Ref Range Status  05/17/2023 6.0 6.0 - 8.5 g/dL Final  14/78/2956 7.3 6.4 - 8.2 g/dL Final   EGFR (African American)  Date Value Ref Range Status  01/15/2014 >60  Final   GFR calc Af Amer  Date Value Ref Range Status  09/24/2020 83 >59 mL/min/1.73 Final    Comment:    **In accordance with recommendations from the NKF-ASN Task force,**   [facility] is in the process of updating its eGFR calculation to the   2021 CKD-EPI creatinine equation that estimates kidney function   without a race variable.    eGFR  Date Value Ref Range Status  05/17/2023 54 (L) >59 mL/min/1.73 Final   EGFR (Non-African Amer.)  Date Value Ref Range Status  01/15/2014 >60  Final    Comment:    eGFR values <46mL/min/1.73 m2 may be an indication of chronic kidney disease (CKD). Calculated eGFR is useful in patients with stable renal function. The eGFR calculation will not be reliable in acutely ill patients when serum creatinine is changing rapidly. It is not useful in  patients on dialysis. The eGFR calculation may  not be applicable to patients at the low and high extremes of body sizes, pregnant women, and vegetarians.    GFR calc non Af Amer  Date Value Ref Range Status   09/24/2020 72 >59 mL/min/1.73 Final

## 2024-01-11 NOTE — Telephone Encounter (Signed)
 Okay to do a virtual visit.

## 2024-01-11 NOTE — Telephone Encounter (Signed)
 Can do video visit but not telephone visit.  She can also reschedule.

## 2024-01-11 NOTE — Telephone Encounter (Signed)
 LMTCB. E2C2 please advise when patient calls back.

## 2024-01-11 NOTE — Telephone Encounter (Signed)
 Copied from CRM [id number]. Topic: Appointments - Scheduling Inquiry for Clinic >> Jan 11, 2024  9:18 AM Sarah White wrote: Reason for CRM: Patient states for her appointment today she can't make it - her son doesn't get out of work until [employer] be able to drop her. Patient is requesting White phone call appointment instead.

## 2024-01-20 ENCOUNTER — Ambulatory Visit: Admitting: Radiation Oncology

## 2024-01-21 ENCOUNTER — Encounter: Payer: Self-pay | Admitting: Family Medicine

## 2024-01-21 ENCOUNTER — Ambulatory Visit: Admitting: Family Medicine

## 2024-01-21 VITALS — BP 128/62 | HR 65 | Temp 98.4°F

## 2024-01-21 DIAGNOSIS — I129 Hypertensive chronic kidney disease with stage 1 through stage 4 chronic kidney disease, or unspecified chronic kidney disease: Secondary | ICD-10-CM | POA: Diagnosis not present

## 2024-01-21 DIAGNOSIS — J449 Chronic obstructive pulmonary disease, unspecified: Secondary | ICD-10-CM

## 2024-01-21 DIAGNOSIS — E782 Mixed hyperlipidemia: Secondary | ICD-10-CM | POA: Diagnosis not present

## 2024-01-21 DIAGNOSIS — F321 Major depressive disorder, single episode, moderate: Secondary | ICD-10-CM | POA: Diagnosis not present

## 2024-01-21 DIAGNOSIS — N183 Chronic kidney disease, stage 3 unspecified: Secondary | ICD-10-CM

## 2024-01-21 DIAGNOSIS — E1122 Type 2 diabetes mellitus with diabetic chronic kidney disease: Secondary | ICD-10-CM

## 2024-01-21 LAB — BAYER DCA HB A1C WAIVED: HB A1C (BAYER DCA - WAIVED): 6.5 % — ABNORMAL HIGH (ref 4.8–5.6)

## 2024-01-21 MED ORDER — CARVEDILOL 12.5 MG PO TABS: 12.5000 mg | ORAL_TABLET | Freq: Two times a day (BID) | ORAL | 1 refills | Status: DC

## 2024-01-21 MED ORDER — TRAZODONE HCL 50 MG PO TABS: 50.0000 mg | ORAL_TABLET | Freq: Every evening | ORAL | 1 refills | Status: DC | PRN

## 2024-01-21 MED ORDER — PANTOPRAZOLE SODIUM 40 MG PO TBEC: 40.0000 mg | DELAYED_RELEASE_TABLET | Freq: Two times a day (BID) | ORAL | 1 refills | Status: DC

## 2024-01-21 MED ORDER — ONDANSETRON HCL 8 MG PO TABS: 8.0000 mg | ORAL_TABLET | Freq: Three times a day (TID) | ORAL | 3 refills | Status: DC | PRN

## 2024-01-21 MED ORDER — QUETIAPINE FUMARATE ER 50 MG PO TB24: 100.0000 mg | ORAL_TABLET | Freq: Every day | ORAL | 1 refills | Status: DC

## 2024-01-21 MED ORDER — FUROSEMIDE 40 MG PO TABS: 40.0000 mg | ORAL_TABLET | Freq: Every day | ORAL | 1 refills | Status: DC

## 2024-01-21 MED ORDER — ROSUVASTATIN CALCIUM 20 MG PO TABS: 20.0000 mg | ORAL_TABLET | Freq: Every day | ORAL | 1 refills | Status: DC

## 2024-01-21 MED ORDER — SERTRALINE HCL 100 MG PO TABS: 200.0000 mg | ORAL_TABLET | Freq: Every day | ORAL | 1 refills | Status: DC

## 2024-01-21 MED ORDER — CLOPIDOGREL BISULFATE 75 MG PO TABS: 75.0000 mg | ORAL_TABLET | Freq: Every day | ORAL | 1 refills | Status: DC

## 2024-01-21 MED ORDER — BUPROPION HCL ER (XL) 300 MG PO TB24: 300.0000 mg | ORAL_TABLET | Freq: Every day | ORAL | 1 refills | Status: DC

## 2024-01-21 NOTE — Assessment & Plan Note (Signed)
 Under good control on current regimen. Continue current regimen. Continue to monitor. Call with any concerns. Refills given. Labs drawn today.

## 2024-01-21 NOTE — Assessment & Plan Note (Signed)
 Up slightly with A1c of 6.5. up from 5.9. Will recheck in 3 months. Call with any concerns.

## 2024-01-21 NOTE — Progress Notes (Signed)
 BP 128/62   Pulse 65   Temp 98.4 F (36.9 C) (Oral)   SpO2 96%    Subjective:    [name], female    DOB: 04/17/1946, 78 y.o.   MRN: [id number]  HPI: Sarah White is a 78 y.o. female  Chief Complaint  Patient presents with   Diabetes   DIABETES Hypoglycemic episodes:no Polydipsia/polyuria: no Visual disturbance: no Chest pain: no Paresthesias: no Glucose Monitoring: no Taking Insulin ?: no Blood Pressure Monitoring: rarely Retinal Examination: Not up to Date Foot Exam: Up to Date Diabetic Education: Completed Pneumovax: Up to Date Influenza: Up to Date Aspirin : yes  HYPERTENSION / HYPERLIPIDEMIA Satisfied with current treatment? yes Duration of hypertension: chronic BP monitoring frequency: not checking BP medication side effects: no Past BP meds: lasix , carvedilol , entresto  Duration of hyperlipidemia: chronic Cholesterol medication side effects: no Cholesterol supplements: none Past cholesterol medications: crestor  Medication compliance: excellent compliance Aspirin : yes Recent stressors: no Recurrent headaches: no Visual changes: no Palpitations: no Dyspnea: no Chest pain: no Lower extremity edema: no Dizzy/lightheaded: no  DEPRESSION Mood status: controlled Satisfied with current treatment?: yes Symptom severity: mild  Duration of current treatment : chronic Side effects: no Medication compliance: excellent compliance Psychotherapy/counseling: no  Previous psychiatric medications: sertraline , seroquel  Depressed mood: no Anxious mood: no Anhedonia: no Significant weight loss or gain: no Insomnia: no  Fatigue: yes Feelings of worthlessness or guilt: no Impaired concentration/indecisiveness: no Suicidal ideations: no Hopelessness: no Crying spells: no    01/21/2024    4:24 PM 09/20/2023    4:01 PM 06/22/2023   10:13 AM 05/17/2023    4:12 PM 05/28/2022    4:30 PM  Depression screen PHQ 2/9  Decreased Interest 0 0 0 0 0   Down, Depressed, Hopeless 0 0 1 1 1   PHQ - 2 Score 0 0 1 1 1   Altered sleeping 0 0 0 0 3  Tired, decreased energy 0 0 0 1 0  Change in appetite 0 0 0 0 0  Feeling bad or failure about yourself  0 0 0 1 0  Trouble concentrating 0 0 0 0 0  Moving slowly or fidgety/restless 0 0 0 0 0  Suicidal thoughts 0 0 0 0 0  PHQ-9 Score 0 0 1 3 4   Difficult doing work/chores Not difficult at all Not difficult at all Not difficult at all Somewhat difficult Very difficult    Relevant past medical, surgical, family and social history reviewed and updated as indicated. Interim medical history since our last visit reviewed. Allergies and medications reviewed and updated.  Review of Systems  Constitutional: Negative.   Respiratory: Negative.    Cardiovascular: Negative.   Musculoskeletal: Negative.   Neurological: Negative.   Psychiatric/Behavioral: Negative.      Per HPI unless specifically indicated above     Objective:     BP 128/62   Pulse 65   Temp 98.4 F (36.9 C) (Oral)   SpO2 96%   Wt Readings from Last 3 Encounters:  11/11/23 174 lb (78.9 kg)  10/05/23 176 lb (79.8 kg)  09/20/23 176 lb (79.8 kg)    Physical Exam Vitals and nursing note reviewed.  Constitutional:      General: She is not in acute distress.    Appearance: Normal appearance. She is not ill-appearing, toxic-appearing or diaphoretic.  HENT:     Head: Normocephalic and atraumatic.     Right Ear: External ear normal.     Left Ear: External ear  normal.     Nose: Nose normal.     Mouth/Throat:     Mouth: Mucous membranes are moist.     Pharynx: Oropharynx is clear.  Eyes:     General: No scleral icterus.       Right eye: No discharge.        Left eye: No discharge.     Extraocular Movements: Extraocular movements intact.     Conjunctiva/sclera: Conjunctivae normal.     Pupils: Pupils are equal, round, and reactive to light.  Cardiovascular:     Rate and Rhythm: Normal rate and regular rhythm.     Pulses:  Normal pulses.     Heart sounds: Normal heart sounds. No murmur heard.    No friction rub. No gallop.  Pulmonary:     Effort: Pulmonary effort is normal. No respiratory distress.     Breath sounds: Normal breath sounds. No stridor. No wheezing, rhonchi or rales.  Chest:     Chest wall: No tenderness.  Musculoskeletal:        General: Normal range of motion.     Cervical back: Normal range of motion and neck supple.  Skin:    General: Skin is warm and dry.     Capillary Refill: Capillary refill takes less than 2 seconds.     Coloration: Skin is not jaundiced or pale.     Findings: No bruising, erythema, lesion or rash.  Neurological:     General: No focal deficit present.     Mental Status: She is alert and oriented to person, place, and time. Mental status is at baseline.  Psychiatric:        Mood and Affect: Mood normal.        Behavior: Behavior normal.        Thought Content: Thought content normal.        Judgment: Judgment normal.     Results for orders placed or performed in visit on 01/21/24  Bayer DCA Hb A1c Waived   Collection Time: 01/21/24  4:08 PM  Result Value Ref Range   HB A1C (BAYER DCA - WAIVED) 6.5 (H) 4.8 - 5.6 %      Assessment & Plan:   Problem List Items Addressed This Visit       Respiratory   COPD (chronic obstructive pulmonary disease) (HCC)   Under good control on current regimen. Continue current regimen. Continue to monitor. Call with any concerns. Refills given. Labs drawn today.         Endocrine   Diabetes mellitus with chronic kidney disease (HCC) - Primary   Up slightly with A1c of 6.5. up from 5.9. Will recheck in 3 months. Call with any concerns.       Relevant Medications   rosuvastatin  (CRESTOR ) 20 MG tablet   Other Relevant Orders   Bayer DCA Hb A1c Waived (Completed)   Microalbumin, Urine Waived   CBC with Differential/Platelet   Comprehensive metabolic panel with GFR   Lipid Panel w/o Chol/HDL Ratio   TSH      Genitourinary   Hypertensive renal disease   Under good control on current regimen. Continue current regimen. Continue to monitor. Call with any concerns. Refills given. Labs drawn today.          Other   Hyperlipidemia   Under good control on current regimen. Continue current regimen. Continue to monitor. Call with any concerns. Refills given. Labs drawn today.       Relevant Medications   carvedilol  (  COREG ) 12.5 MG tablet   furosemide  (LASIX ) 40 MG tablet   rosuvastatin  (CRESTOR ) 20 MG tablet   Depression, major, single episode, moderate (HCC)   Under good control on current regimen. Continue current regimen. Continue to monitor. Call with any concerns. Refills given. Labs drawn today.       Relevant Medications   buPROPion  (WELLBUTRIN  XL) 300 MG 24 hr tablet   sertraline  (ZOLOFT ) 100 MG tablet   traZODone  (DESYREL ) 50 MG tablet     Follow up plan: Return in about 3 months (around 04/22/2024).

## 2024-01-22 LAB — CBC WITH DIFFERENTIAL/PLATELET
Basophils Absolute: 0 10*3/uL (ref 0.0–0.2)
Basos: 1 %
EOS (ABSOLUTE): 0.1 10*3/uL (ref 0.0–0.4)
Eos: 2 %
Hematocrit: 39.8 % (ref 34.0–46.6)
Hemoglobin: 12.5 g/dL (ref 11.1–15.9)
Immature Grans (Abs): 0 10*3/uL (ref 0.0–0.1)
Immature Granulocytes: 0 %
Lymphocytes Absolute: 0.6 10*3/uL — ABNORMAL LOW (ref 0.7–3.1)
Lymphs: 15 %
MCH: 26.5 pg — ABNORMAL LOW (ref 26.6–33.0)
MCHC: 31.4 g/dL — ABNORMAL LOW (ref 31.5–35.7)
MCV: 85 fL (ref 79–97)
Monocytes Absolute: 0.3 10*3/uL (ref 0.1–0.9)
Monocytes: 7 %
Neutrophils Absolute: 3 10*3/uL (ref 1.4–7.0)
Neutrophils: 75 %
Platelets: 126 10*3/uL — ABNORMAL LOW (ref 150–450)
RBC: 4.71 x10E6/uL (ref 3.77–5.28)
RDW: 15.5 % — ABNORMAL HIGH (ref 11.7–15.4)
WBC: 3.9 10*3/uL (ref 3.4–10.8)

## 2024-01-22 LAB — COMPREHENSIVE METABOLIC PANEL WITH GFR
ALT: 42 IU/L — ABNORMAL HIGH (ref 0–32)
AST: 44 IU/L — ABNORMAL HIGH (ref 0–40)
Albumin: 3.9 g/dL (ref 3.8–4.8)
Alkaline Phosphatase: 109 IU/L (ref 44–121)
BUN/Creatinine Ratio: 11 — ABNORMAL LOW (ref 12–28)
BUN: 14 mg/dL (ref 8–27)
Bilirubin Total: 0.2 mg/dL (ref 0.0–1.2)
CO2: 21 mmol/L (ref 20–29)
Calcium: 8.5 mg/dL — ABNORMAL LOW (ref 8.7–10.3)
Chloride: 103 mmol/L (ref 96–106)
Creatinine, Ser: 1.27 mg/dL — ABNORMAL HIGH (ref 0.57–1.00)
Globulin, Total: 1.9 g/dL (ref 1.5–4.5)
Glucose: 93 mg/dL (ref 70–99)
Potassium: 5 mmol/L (ref 3.5–5.2)
Sodium: 138 mmol/L (ref 134–144)
Total Protein: 5.8 g/dL — ABNORMAL LOW (ref 6.0–8.5)
eGFR: 44 mL/min/{1.73_m2} — ABNORMAL LOW (ref >59)

## 2024-01-22 LAB — LIPID PANEL W/O CHOL/HDL RATIO
Cholesterol, Total: 157 mg/dL (ref 100–199)
HDL: 48 mg/dL (ref >39)
LDL Chol Calc (NIH): 76 mg/dL (ref 0–99)
Triglycerides: 201 mg/dL — ABNORMAL HIGH (ref 0–149)
VLDL Cholesterol Cal: 33 mg/dL (ref 5–40)

## 2024-01-22 LAB — TSH: TSH: 2.9 u[IU]/mL (ref 0.450–4.500)

## 2024-01-25 ENCOUNTER — Ambulatory Visit: Payer: Self-pay | Admitting: Family Medicine

## 2024-01-31 ENCOUNTER — Other Ambulatory Visit: Payer: Self-pay | Admitting: *Deleted

## 2024-01-31 ENCOUNTER — Ambulatory Visit
Admission: RE | Admit: 2024-01-31 | Discharge: 2024-01-31 | Disposition: A | Discharge status: Home / Self Care | Source: Ambulatory Visit | Attending: Radiation Oncology | Admitting: Radiation Oncology

## 2024-01-31 VITALS — BP 117/87 | HR 74 | Resp 16 | Ht 64.0 in

## 2024-01-31 DIAGNOSIS — R911 Solitary pulmonary nodule: Secondary | ICD-10-CM

## 2024-01-31 NOTE — Progress Notes (Signed)
 Radiation Oncology Follow up Note  Name: Sarah White   Date:   01/31/2024 MRN:  [id number] DOB: 09/19/1945    This 78 y.o. female presents to the clinic today for 1 month follow-up status post ultra hypofractionated radiation therapy for stage I A3 (cT1 cN0 M0) non-small cell lung cancer of the right upper lobe.  REFERRING PROVIDER: Solomon Dupre, DO  HPI: Patient is a 78 year old female now out 1 month having completed.  Ultra hypofractionated radiation therapy to her right lung for a stage I A3 non-small cell lung cancer of the right upper lobe.  She is seen today in routine follow-up and is doing well.  She states her cough has improved.  She is having no problems with dysphagia at this time.  COMPLICATIONS OF TREATMENT: none  FOLLOW UP COMPLIANCE: keeps appointments   PHYSICAL EXAM:  BP 117/87   Pulse 74   Resp 16   Ht 5\' 4"  (1.626 m)   BMI 29.87 kg/m patient does have numerous ecchymosis of the skin and her son states she has fallen a few times. Well-developed well-nourished patient in NAD. HEENT reveals PERLA, EOMI, discs not visualized.  Oral cavity is clear. No oral mucosal lesions are identified. Neck is clear without evidence of cervical or supraclavicular adenopathy. Lungs are clear to A&P. Cardiac examination is essentially unremarkable with regular rate and rhythm without murmur rub or thrill. Abdomen is benign with no organomegaly or masses noted. Motor sensory and DTR levels are equal and symmetric in the upper and lower extremities. Cranial nerves II through XII are grossly intact. Proprioception is intact. No peripheral adenopathy or edema is identified. No motor or sensory levels are noted. Crude visual fields are within normal range.  RADIOLOGY RESULTS: CT scan ordered in 3 months  PLAN: At this time of asked to see her back in 3 months with a CT scan of her chest prior to that visit.  Please her overall progress she continues close follow-up care with Dr. Viva Grise  and pulmonary medicine.  Patient knows to call with any concerns.  I would like to take this opportunity to thank you for allowing me to participate in the care of your patient.Glenis Langdon, MD

## 2024-02-02 ENCOUNTER — Other Ambulatory Visit: Payer: Self-pay

## 2024-02-02 DIAGNOSIS — J449 Chronic obstructive pulmonary disease, unspecified: Secondary | ICD-10-CM

## 2024-02-03 ENCOUNTER — Encounter

## 2024-02-03 ENCOUNTER — Ambulatory Visit: Admitting: Pulmonary Disease

## 2024-02-04 ENCOUNTER — Inpatient Hospital Stay
Admission: EM | Admit: 2024-02-04 | Discharge: 2024-02-15 | DRG: 193 | Disposition: A | Discharge status: Skilled Nursing Facility | Attending: Internal Medicine | Admitting: Internal Medicine

## 2024-02-04 ENCOUNTER — Ambulatory Visit: Payer: Self-pay

## 2024-02-04 ENCOUNTER — Other Ambulatory Visit: Payer: Self-pay

## 2024-02-04 ENCOUNTER — Emergency Department

## 2024-02-04 ENCOUNTER — Observation Stay

## 2024-02-04 DIAGNOSIS — J189 Pneumonia, unspecified organism: Secondary | ICD-10-CM | POA: Diagnosis not present

## 2024-02-04 DIAGNOSIS — E876 Hypokalemia: Secondary | ICD-10-CM | POA: Insufficient documentation

## 2024-02-04 DIAGNOSIS — N1831 Chronic kidney disease, stage 3a: Secondary | ICD-10-CM

## 2024-02-04 DIAGNOSIS — C3491 Malignant neoplasm of unspecified part of right bronchus or lung: Secondary | ICD-10-CM

## 2024-02-04 DIAGNOSIS — J9601 Acute respiratory failure with hypoxia: Secondary | ICD-10-CM | POA: Diagnosis not present

## 2024-02-04 DIAGNOSIS — J441 Chronic obstructive pulmonary disease with (acute) exacerbation: Secondary | ICD-10-CM | POA: Diagnosis not present

## 2024-02-04 DIAGNOSIS — W1830XA Fall on same level, unspecified, initial encounter: Secondary | ICD-10-CM | POA: Diagnosis not present

## 2024-02-04 DIAGNOSIS — B370 Candidal stomatitis: Secondary | ICD-10-CM

## 2024-02-04 DIAGNOSIS — E119 Type 2 diabetes mellitus without complications: Secondary | ICD-10-CM

## 2024-02-04 DIAGNOSIS — K5900 Constipation, unspecified: Secondary | ICD-10-CM | POA: Insufficient documentation

## 2024-02-04 DIAGNOSIS — N183 Chronic kidney disease, stage 3 unspecified: Secondary | ICD-10-CM | POA: Diagnosis present

## 2024-02-04 DIAGNOSIS — R17 Unspecified jaundice: Secondary | ICD-10-CM

## 2024-02-04 DIAGNOSIS — I10 Essential (primary) hypertension: Secondary | ICD-10-CM | POA: Diagnosis present

## 2024-02-04 DIAGNOSIS — R7881 Bacteremia: Secondary | ICD-10-CM | POA: Diagnosis present

## 2024-02-04 DIAGNOSIS — D696 Thrombocytopenia, unspecified: Secondary | ICD-10-CM | POA: Diagnosis present

## 2024-02-04 DIAGNOSIS — I5020 Unspecified systolic (congestive) heart failure: Secondary | ICD-10-CM | POA: Diagnosis present

## 2024-02-04 DIAGNOSIS — C3490 Malignant neoplasm of unspecified part of unspecified bronchus or lung: Secondary | ICD-10-CM | POA: Diagnosis present

## 2024-02-04 DIAGNOSIS — E1122 Type 2 diabetes mellitus with diabetic chronic kidney disease: Secondary | ICD-10-CM | POA: Diagnosis not present

## 2024-02-04 DIAGNOSIS — Z8542 Personal history of malignant neoplasm of other parts of uterus: Secondary | ICD-10-CM

## 2024-02-04 DIAGNOSIS — J13 Pneumonia due to Streptococcus pneumoniae: Secondary | ICD-10-CM | POA: Diagnosis not present

## 2024-02-04 DIAGNOSIS — R112 Nausea with vomiting, unspecified: Secondary | ICD-10-CM | POA: Diagnosis not present

## 2024-02-04 DIAGNOSIS — I340 Nonrheumatic mitral (valve) insufficiency: Secondary | ICD-10-CM | POA: Diagnosis not present

## 2024-02-04 DIAGNOSIS — J440 Chronic obstructive pulmonary disease with acute lower respiratory infection: Secondary | ICD-10-CM | POA: Diagnosis present

## 2024-02-04 DIAGNOSIS — K2100 Gastro-esophageal reflux disease with esophagitis, without bleeding: Secondary | ICD-10-CM | POA: Diagnosis not present

## 2024-02-04 DIAGNOSIS — Z7951 Long term (current) use of inhaled steroids: Secondary | ICD-10-CM

## 2024-02-04 DIAGNOSIS — Z7902 Long term (current) use of antithrombotics/antiplatelets: Secondary | ICD-10-CM

## 2024-02-04 DIAGNOSIS — Z66 Do not resuscitate: Secondary | ICD-10-CM | POA: Diagnosis present

## 2024-02-04 DIAGNOSIS — Z79899 Other long term (current) drug therapy: Secondary | ICD-10-CM

## 2024-02-04 DIAGNOSIS — F32A Depression, unspecified: Secondary | ICD-10-CM | POA: Diagnosis not present

## 2024-02-04 DIAGNOSIS — Z885 Allergy status to narcotic agent status: Secondary | ICD-10-CM | POA: Diagnosis not present

## 2024-02-04 DIAGNOSIS — I5023 Acute on chronic systolic (congestive) heart failure: Secondary | ICD-10-CM | POA: Diagnosis present

## 2024-02-04 DIAGNOSIS — J9600 Acute respiratory failure, unspecified whether with hypoxia or hypercapnia: Secondary | ICD-10-CM | POA: Diagnosis not present

## 2024-02-04 DIAGNOSIS — J99 Respiratory disorders in diseases classified elsewhere: Secondary | ICD-10-CM | POA: Diagnosis not present

## 2024-02-04 DIAGNOSIS — J181 Lobar pneumonia, unspecified organism: Secondary | ICD-10-CM | POA: Diagnosis not present

## 2024-02-04 DIAGNOSIS — B953 Streptococcus pneumoniae as the cause of diseases classified elsewhere: Secondary | ICD-10-CM | POA: Diagnosis present

## 2024-02-04 DIAGNOSIS — I5040 Unspecified combined systolic (congestive) and diastolic (congestive) heart failure: Secondary | ICD-10-CM | POA: Diagnosis not present

## 2024-02-04 DIAGNOSIS — Z743 Need for continuous supervision: Secondary | ICD-10-CM | POA: Diagnosis not present

## 2024-02-04 DIAGNOSIS — I771 Stricture of artery: Secondary | ICD-10-CM | POA: Diagnosis not present

## 2024-02-04 DIAGNOSIS — K5909 Other constipation: Secondary | ICD-10-CM | POA: Diagnosis not present

## 2024-02-04 DIAGNOSIS — Z888 Allergy status to other drugs, medicaments and biological substances status: Secondary | ICD-10-CM | POA: Diagnosis not present

## 2024-02-04 DIAGNOSIS — J90 Pleural effusion, not elsewhere classified: Secondary | ICD-10-CM | POA: Diagnosis not present

## 2024-02-04 DIAGNOSIS — R079 Chest pain, unspecified: Secondary | ICD-10-CM | POA: Diagnosis not present

## 2024-02-04 DIAGNOSIS — R110 Nausea: Secondary | ICD-10-CM | POA: Diagnosis not present

## 2024-02-04 DIAGNOSIS — Z90710 Acquired absence of both cervix and uterus: Secondary | ICD-10-CM

## 2024-02-04 DIAGNOSIS — J449 Chronic obstructive pulmonary disease, unspecified: Secondary | ICD-10-CM | POA: Diagnosis not present

## 2024-02-04 DIAGNOSIS — R41841 Cognitive communication deficit: Secondary | ICD-10-CM | POA: Diagnosis not present

## 2024-02-04 DIAGNOSIS — B379 Candidiasis, unspecified: Secondary | ICD-10-CM | POA: Diagnosis present

## 2024-02-04 DIAGNOSIS — W19XXXA Unspecified fall, initial encounter: Secondary | ICD-10-CM | POA: Diagnosis not present

## 2024-02-04 DIAGNOSIS — R0781 Pleurodynia: Secondary | ICD-10-CM | POA: Diagnosis not present

## 2024-02-04 DIAGNOSIS — R069 Unspecified abnormalities of breathing: Secondary | ICD-10-CM | POA: Diagnosis not present

## 2024-02-04 DIAGNOSIS — J439 Emphysema, unspecified: Secondary | ICD-10-CM | POA: Diagnosis not present

## 2024-02-04 DIAGNOSIS — F17210 Nicotine dependence, cigarettes, uncomplicated: Secondary | ICD-10-CM | POA: Diagnosis present

## 2024-02-04 DIAGNOSIS — R1312 Dysphagia, oropharyngeal phase: Secondary | ICD-10-CM | POA: Diagnosis not present

## 2024-02-04 DIAGNOSIS — I447 Left bundle-branch block, unspecified: Secondary | ICD-10-CM | POA: Diagnosis not present

## 2024-02-04 DIAGNOSIS — R918 Other nonspecific abnormal finding of lung field: Secondary | ICD-10-CM | POA: Diagnosis not present

## 2024-02-04 DIAGNOSIS — I130 Hypertensive heart and chronic kidney disease with heart failure and stage 1 through stage 4 chronic kidney disease, or unspecified chronic kidney disease: Secondary | ICD-10-CM | POA: Diagnosis not present

## 2024-02-04 DIAGNOSIS — Z741 Need for assistance with personal care: Secondary | ICD-10-CM | POA: Diagnosis not present

## 2024-02-04 DIAGNOSIS — Z1152 Encounter for screening for COVID-19: Secondary | ICD-10-CM | POA: Diagnosis not present

## 2024-02-04 DIAGNOSIS — R0902 Hypoxemia: Secondary | ICD-10-CM | POA: Diagnosis not present

## 2024-02-04 DIAGNOSIS — R0600 Dyspnea, unspecified: Secondary | ICD-10-CM | POA: Diagnosis not present

## 2024-02-04 DIAGNOSIS — R1310 Dysphagia, unspecified: Secondary | ICD-10-CM | POA: Diagnosis present

## 2024-02-04 DIAGNOSIS — M6259 Muscle wasting and atrophy, not elsewhere classified, multiple sites: Secondary | ICD-10-CM | POA: Diagnosis not present

## 2024-02-04 DIAGNOSIS — Z515 Encounter for palliative care: Secondary | ICD-10-CM | POA: Diagnosis not present

## 2024-02-04 DIAGNOSIS — I959 Hypotension, unspecified: Secondary | ICD-10-CM | POA: Diagnosis not present

## 2024-02-04 DIAGNOSIS — G4709 Other insomnia: Secondary | ICD-10-CM | POA: Diagnosis not present

## 2024-02-04 DIAGNOSIS — R008 Other abnormalities of heart beat: Secondary | ICD-10-CM | POA: Diagnosis not present

## 2024-02-04 HISTORY — DX: Chronic obstructive pulmonary disease, unspecified: J44.9

## 2024-02-04 LAB — COMPREHENSIVE METABOLIC PANEL WITH GFR
ALT: 41 U/L (ref 0–44)
AST: 38 U/L (ref 15–41)
Albumin: 3 g/dL — ABNORMAL LOW (ref 3.5–5.0)
Alkaline Phosphatase: 77 U/L (ref 38–126)
Anion gap: 15 (ref 5–15)
BUN: 27 mg/dL — ABNORMAL HIGH (ref 8–23)
CO2: 23 mmol/L (ref 22–32)
Calcium: 8.6 mg/dL — ABNORMAL LOW (ref 8.9–10.3)
Chloride: 94 mmol/L — ABNORMAL LOW (ref 98–111)
Creatinine, Ser: 1.03 mg/dL — ABNORMAL HIGH (ref 0.44–1.00)
GFR, Estimated: 57 mL/min — ABNORMAL LOW (ref >60)
Glucose, Bld: 143 mg/dL — ABNORMAL HIGH (ref 70–99)
Potassium: 3.7 mmol/L (ref 3.5–5.1)
Sodium: 132 mmol/L — ABNORMAL LOW (ref 135–145)
Total Bilirubin: 2.9 mg/dL — ABNORMAL HIGH (ref 0.0–1.2)
Total Protein: 6.1 g/dL — ABNORMAL LOW (ref 6.5–8.1)

## 2024-02-04 LAB — RESP PANEL BY RT-PCR (RSV, FLU A&B, COVID)  RVPGX2
Influenza A by PCR: NEGATIVE
Influenza B by PCR: NEGATIVE
Resp Syncytial Virus by PCR: NEGATIVE
SARS Coronavirus 2 by RT PCR: NEGATIVE

## 2024-02-04 LAB — PROTIME-INR
INR: 1.3 — ABNORMAL HIGH (ref 0.8–1.2)
Prothrombin Time: 16.3 s — ABNORMAL HIGH (ref 11.4–15.2)

## 2024-02-04 LAB — GLUCOSE, CAPILLARY: Glucose-Capillary: 132 mg/dL — ABNORMAL HIGH (ref 70–99)

## 2024-02-04 LAB — LACTIC ACID, PLASMA
Lactic Acid, Venous: 1 mmol/L (ref 0.5–1.9)
Lactic Acid, Venous: 0.8 mmol/L (ref 0.5–1.9)

## 2024-02-04 MED ORDER — IPRATROPIUM-ALBUTEROL 0.5-2.5 (3) MG/3ML IN SOLN
3.0000 mL | Freq: Four times a day (QID) | RESPIRATORY_TRACT | Status: DC
  Administered 2024-02-04 – 2024-02-06 (×7): 3 mL via RESPIRATORY_TRACT
  Filled 2024-02-04 (×7): qty 3

## 2024-02-04 MED ORDER — GUAIFENESIN ER 600 MG PO TB12
600.0000 mg | ORAL_TABLET | Freq: Two times a day (BID) | ORAL | Status: DC
  Administered 2024-02-05 – 2024-02-15 (×20): 600 mg via ORAL
  Filled 2024-02-04 (×21): qty 1

## 2024-02-04 MED ORDER — CLOPIDOGREL BISULFATE 75 MG PO TABS
75.0000 mg | ORAL_TABLET | Freq: Every morning | ORAL | Status: DC
  Administered 2024-02-05 – 2024-02-15 (×11): 75 mg via ORAL
  Filled 2024-02-04 (×11): qty 1

## 2024-02-04 MED ORDER — CARVEDILOL 12.5 MG PO TABS
12.5000 mg | ORAL_TABLET | Freq: Two times a day (BID) | ORAL | Status: DC
  Administered 2024-02-05 – 2024-02-15 (×20): 12.5 mg via ORAL
  Filled 2024-02-04 (×21): qty 1

## 2024-02-04 MED ORDER — HYDROMORPHONE HCL 1 MG/ML IJ SOLN
0.5000 mg | INTRAMUSCULAR | Status: DC | PRN
  Administered 2024-02-07 (×2): 0.5 mg via INTRAVENOUS
  Filled 2024-02-04 (×2): qty 0.5

## 2024-02-04 MED ORDER — LACTATED RINGERS IV BOLUS (SEPSIS)
1000.0000 mL | Freq: Once | INTRAVENOUS | Status: AC
  Administered 2024-02-04: 1000 mL via INTRAVENOUS

## 2024-02-04 MED ORDER — HYDROCOD POLI-CHLORPHE POLI ER 10-8 MG/5ML PO SUER
5.0000 mL | Freq: Two times a day (BID) | ORAL | Status: DC | PRN
  Administered 2024-02-13: 5 mL via ORAL
  Filled 2024-02-04: qty 5

## 2024-02-04 MED ORDER — SODIUM CHLORIDE 0.9 % IV SOLN
2.0000 g | Freq: Once | INTRAVENOUS | Status: AC
  Administered 2024-02-04: 2 g via INTRAVENOUS
  Filled 2024-02-04: qty 20

## 2024-02-04 MED ORDER — ENOXAPARIN SODIUM 40 MG/0.4ML IJ SOSY
40.0000 mg | PREFILLED_SYRINGE | INTRAMUSCULAR | Status: DC
  Administered 2024-02-04 – 2024-02-14 (×11): 40 mg via SUBCUTANEOUS
  Filled 2024-02-04 (×11): qty 0.4

## 2024-02-04 MED ORDER — SODIUM CHLORIDE 0.9 % IV SOLN
500.0000 mg | INTRAVENOUS | Status: DC
  Filled 2024-02-04: qty 5

## 2024-02-04 MED ORDER — PREDNISONE 20 MG PO TABS
40.0000 mg | ORAL_TABLET | Freq: Every day | ORAL | Status: DC
  Administered 2024-02-05 – 2024-02-06 (×2): 40 mg via ORAL
  Filled 2024-02-04 (×2): qty 2

## 2024-02-04 MED ORDER — TRAZODONE HCL 50 MG PO TABS
50.0000 mg | ORAL_TABLET | Freq: Every evening | ORAL | Status: DC | PRN
  Administered 2024-02-05 – 2024-02-07 (×3): 50 mg via ORAL
  Filled 2024-02-04 (×3): qty 1

## 2024-02-04 MED ORDER — SODIUM CHLORIDE 0.9 % IV SOLN
2.0000 g | INTRAVENOUS | Status: DC
  Administered 2024-02-05: 2 g via INTRAVENOUS
  Filled 2024-02-04: qty 20

## 2024-02-04 MED ORDER — ROSUVASTATIN CALCIUM 20 MG PO TABS
20.0000 mg | ORAL_TABLET | Freq: Every day | ORAL | Status: DC
  Administered 2024-02-05 – 2024-02-15 (×11): 20 mg via ORAL
  Filled 2024-02-04 (×6): qty 1
  Filled 2024-02-04: qty 2
  Filled 2024-02-04 (×4): qty 1

## 2024-02-04 MED ORDER — OXYCODONE HCL 5 MG PO TABS
5.0000 mg | ORAL_TABLET | Freq: Four times a day (QID) | ORAL | Status: DC | PRN
  Administered 2024-02-04 – 2024-02-08 (×3): 5 mg via ORAL
  Filled 2024-02-04 (×3): qty 1

## 2024-02-04 MED ORDER — SODIUM CHLORIDE 0.9 % IV SOLN
500.0000 mg | Freq: Once | INTRAVENOUS | Status: AC
  Administered 2024-02-04: 500 mg via INTRAVENOUS
  Filled 2024-02-04: qty 5

## 2024-02-04 MED ORDER — BUPROPION HCL ER (XL) 150 MG PO TB24
300.0000 mg | ORAL_TABLET | Freq: Every day | ORAL | Status: DC
  Administered 2024-02-05 – 2024-02-15 (×11): 300 mg via ORAL
  Filled 2024-02-04 (×11): qty 2

## 2024-02-04 MED ORDER — PANTOPRAZOLE SODIUM 40 MG PO TBEC
40.0000 mg | DELAYED_RELEASE_TABLET | Freq: Two times a day (BID) | ORAL | Status: DC
  Administered 2024-02-04 – 2024-02-15 (×22): 40 mg via ORAL
  Filled 2024-02-04 (×22): qty 1

## 2024-02-04 MED ORDER — SERTRALINE HCL 50 MG PO TABS
200.0000 mg | ORAL_TABLET | Freq: Every day | ORAL | Status: DC
  Administered 2024-02-05 – 2024-02-15 (×11): 200 mg via ORAL
  Filled 2024-02-04 (×11): qty 4

## 2024-02-04 MED ORDER — QUETIAPINE FUMARATE ER 50 MG PO TB24
100.0000 mg | ORAL_TABLET | Freq: Every day | ORAL | Status: DC
  Administered 2024-02-05 – 2024-02-14 (×10): 100 mg via ORAL
  Filled 2024-02-04 (×14): qty 2

## 2024-02-04 NOTE — Assessment & Plan Note (Addendum)
 Patient does not wear oxygen at home.  ER physician documented pulse ox of 75% on room air.  Currently on 5 L high flow nasal cannula.  Incentive spirometer.  Continue taking deep breaths.

## 2024-02-04 NOTE — Assessment & Plan Note (Addendum)
 Today's platelet count 111.

## 2024-02-04 NOTE — Assessment & Plan Note (Addendum)
 Per chart review, patient has a history of CKD stage IIIa with baseline creatinine ranging between 1.1 and 1.3, currently 1.0.  Last creatinine 0.66.

## 2024-02-04 NOTE — Assessment & Plan Note (Addendum)
 PT recommended rehab

## 2024-02-04 NOTE — Assessment & Plan Note (Addendum)
 Continue nebulizer treatments.  Continue Solu-Medrol 

## 2024-02-04 NOTE — Telephone Encounter (Signed)
 FYI Only or Action Required?: FYI only for provider  Patient was last seen in primary care on 01/21/2024 by Terre Ferri P, DO. Called Nurse Triage reporting Fall. Symptoms began several days ago. Interventions attempted: Rest, hydration, or home remedies. Symptoms are: gradually worsening.  Triage Disposition: Call EMS 911 Now  Patient/caregiver understands and will follow disposition?: Yes  Copied from CRM [id number]. Topic: Clinical - Red Word Triage >> Feb 04, 2024 11:29 AM Georgeann Kindred wrote: Red Word that prompted transfer to Nurse Triage: Patient fell hard and her right side is in severe pain. She states that she is unable to walk, stand, and get any relief. Reason for Disposition  [1] Can't stand (bear weight) or walk AND [2] new-onset after fall  Answer Assessment - Initial Assessment Questions 1. MECHANISM: "How did the fall happen?"     Weakness 2. DOMESTIC VIOLENCE AND ELDER ABUSE SCREENING: "Did you fall because someone pushed you or tried to hurt you?" If Yes, ask: "Are you safe now?"     NA 3. ONSET: "When did the fall happen?" (e.g., minutes, hours, or days ago)     Few days 4. LOCATION: "What part of the body hit the ground?" (e.g., back, buttocks, head, hips, knees, hands, head, stomach)     Entire right side 5. INJURY: "Did you hurt (injure) yourself when you fell?" If Yes, ask: "What did you injure? Tell me more about this?" (e.g., body area; type of injury; pain severity)"     Right side 6. PAIN: "Is there any pain?" If Yes, ask: "How bad is the pain?" (e.g., Scale 1-10; or mild,  moderate, severe)   - NONE (0): No pain   - MILD (1-3): Doesn't interfere with normal activities    - MODERATE (4-7): Interferes with normal activities or awakens from sleep    - SEVERE (8-10): Excruciating pain, unable to do any normal activities      severe 7. SIZE: For cuts, bruises, or swelling, ask: "How large is it?" (e.g., inches or centimeters)      Didn't ask due to acute sob  8.  OTHER SYMPTOMS: "Do you have any other symptoms?" (e.g., dizziness, fever, weakness; new onset or worsening).      Shortness of breath 10. CAUSE: "What do you think caused the fall (or falling)?" (e.g., tripped, dizzy spell)       Weakness, chemo treatments  Additional info: Marvell Slider twice last week, first fall no injury, second fall she hurt her right side just under breast. Unable to ambulate. Conversation dyspnea noted on the call. Speaking only few words at time  Protocols used: Falls and [facility]

## 2024-02-04 NOTE — Assessment & Plan Note (Addendum)
 Elevated total bilirubin on admission which now has normalized.

## 2024-02-04 NOTE — Assessment & Plan Note (Deleted)
 Patient is presenting with several day history of productive cough and shortness of breath, found to have a dense consolidation involving the right lower lobe and some of the right middle lobe. Unclear if radiation would have affected this area.  Given symptoms began after fall, differential also includes aspiration.  - Continue Rocephin  and azithromycin  - Strep pneumo and Legionella urinary antigens - Guaifenesin  and Tussionex

## 2024-02-04 NOTE — ED Triage Notes (Signed)
 Pt arrives via [facility] from home for breathing diffulties. Hx of lung cancer, COPD, MI, and is on blood thinners. Pt has had 10 treatments within the last month and last treatment was on Tuesday. Pt noted breathing difficulties starting on Wednesday and has been using an inhaler but no relief. Pt found satting at 75% on RA by EMS. EMS gave 125 of solumedrol, 2 albuterol  treatments, and 2 duonebs. Pt satting at 94% after duroneb. EMS heard rales in the lungs. Pt complaining of R sided pain after 2 falls this month  EMS vitals: 118 CBG 149/58 BP 94 HR  98.5 F axillary

## 2024-02-04 NOTE — Assessment & Plan Note (Signed)
 History of non-small cell lung cancer diagnosed earlier this year with screening CT.  She is not a candidate for bronchoscopy for tissue sampling or surgical intervention.  She has completed SBRT with her last session on April 30.

## 2024-02-04 NOTE — Assessment & Plan Note (Addendum)
 Patient has a history of HFrEF with last EF of 40% with moderate mitral regurgitation when checked in March 2025.  She appears euvolemic

## 2024-02-04 NOTE — ED Provider Notes (Signed)
 [facility] Provider Note    Event Date/Time   First MD Initiated Contact with Patient 02/04/24 1400     (approximate)   History   Shortness of Breath   HPI Sarah White is a 79 y.o. female with history of lung cancer presenting today for shortness of breath.  Patient states over the past several days she has had continuing worsening shortness of breath with productive cough.  Today she got acutely short of breath with difficulty getting up sputum.  EMS was called and found her to be 75% on room air.  Given Solu-Medrol, DuoNeb x 2, albuterol  x 2.  Rhonchi heard in her right side.  Has had 2 falls on the right side in the past month.  Is on blood thinners.  Patient states last radiation treatment for her lung cancer was approximately 1.5 weeks ago.  Not currently on chemotherapy.     Physical Exam   Triage Vital Signs: ED Triage Vitals  Encounter Vitals Group     BP 02/04/24 1407 139/83     Systolic BP Percentile --      Diastolic BP Percentile --      Pulse Rate 02/04/24 1407 90     Resp 02/04/24 1407 (!) 22     Temp 02/04/24 1407 98.5 F (36.9 C)     Temp Source 02/04/24 1407 Oral     SpO2 02/04/24 1407 96 %     Weight 02/04/24 1408 174 lb (78.9 kg)     Height 02/04/24 1408 5\' 4"  (1.626 m)     Head Circumference --      Peak Flow --      Pain Score 02/04/24 1406 9     Pain Loc --      Pain Education --      Exclude from Growth Chart --     Most recent vital signs: Vitals:   02/04/24 1407  BP: 139/83  Pulse: 90  Resp: (!) 22  Temp: 98.5 F (36.9 C)  SpO2: 96%   Physical Exam: I have reviewed the vital signs and nursing notes. General: Awake, alert, no acute distress.  Nontoxic appearing. Head:  Atraumatic, normocephalic.   ENT:  EOM intact, PERRL. Oral mucosa is pink and moist with no lesions. Neck: Neck is supple with full range of motion, No meningeal signs. Cardiovascular:  RRR, No murmurs. Peripheral pulses palpable and equal  bilaterally. Respiratory:  Symmetrical chest wall expansion.  Significant crackles in the right lower lung base with tachypnea.  No obvious wheezing. Musculoskeletal:  No cyanosis or edema. Moving extremities with full ROM Abdomen:  Soft, nontender, nondistended. Neuro:  GCS 15, moving all four extremities, interacting appropriately. Speech clear. Psych:  Calm, appropriate.   Skin:  Warm, dry, no rash.    ED Results / Procedures / Treatments   Labs (all labs ordered are listed, but only abnormal results are displayed) Labs Reviewed  COMPREHENSIVE METABOLIC PANEL WITH GFR - Abnormal; Notable for the following components:      Result Value   Sodium 132 (*)    Chloride 94 (*)    Glucose, Bld 143 (*)    BUN 27 (*)    Creatinine, Ser 1.03 (*)    Calcium  8.6 (*)    Total Protein 6.1 (*)    Albumin 3.0 (*)    Total Bilirubin 2.9 (*)    GFR, Estimated 57 (*)    All other components within normal limits  PROTIME-INR - Abnormal; Notable  for the following components:   Prothrombin Time 16.3 (*)    INR 1.3 (*)    All other components within normal limits  RESP PANEL BY RT-PCR (RSV, FLU A&B, COVID)  [id number]  CULTURE, BLOOD (ROUTINE X 2)  CULTURE, BLOOD (ROUTINE X 2)  LACTIC ACID, PLASMA  LACTIC ACID, PLASMA  CBC WITH DIFFERENTIAL/PLATELET     EKG My EKG interpretation: Rate of 89, normal sinus rhythm, left bundle branch block.  No acute ST elevations or depressions   RADIOLOGY Independently interpreted chest x-ray with dense right lower lobe pneumonia   PROCEDURES:  Critical Care performed: Yes, see critical care procedure note(s)  .Critical Care  Performed by: Kandee Orion, MD Authorized by: Kandee Orion, MD   Critical care provider statement:    Critical care time (minutes):  30   Critical care was necessary to treat or prevent imminent or life-threatening deterioration of the following conditions:  Respiratory failure   Critical care was time spent personally by me  on the following activities:  Development of treatment plan with patient or surrogate, discussions with consultants, evaluation of patient's response to treatment, examination of patient, ordering and review of laboratory studies, ordering and review of radiographic studies, ordering and performing treatments and interventions, pulse oximetry, re-evaluation of patient's condition and review of old charts   I assumed direction of critical care for this patient from another provider in my specialty: no     Care discussed with: admitting provider      MEDICATIONS ORDERED IN ED: Medications  azithromycin  (ZITHROMAX ) 500 mg in sodium chloride  0.9 % 250 mL IVPB (has no administration in time range)  lactated ringers  bolus 1,000 mL (1,000 mLs Intravenous New Bag/Given 02/04/24 1459)  cefTRIAXone  (ROCEPHIN ) 2 g in sodium chloride  0.9 % 100 mL IVPB (0 g Intravenous Stopped 02/04/24 1535)     IMPRESSION / MDM / ASSESSMENT AND PLAN / ED COURSE  I reviewed the triage vital signs and the nursing notes.                              Differential diagnosis includes, but is not limited to, pneumonia, COVID, flu, RSV, COPD exacerbation, sequelae of lung cancer, rib fractures  Patient's presentation is most consistent with acute presentation with potential threat to life or bodily function.  Patient is a 78 year old female presenting today for shortness of breath in the setting of lung cancer.  Notable crackles on her right side on arrival.  Requiring 2 L nasal cannula on arrival to maintain oxygenation and was profoundly hypoxic with EMS.  Otherwise no tachycardia.  Chest x-ray shows dense right lower lobe pneumonia.  Given 1 L fluids, ceftriaxone , azithromycin .  CMP mostly reassuring other than likely dehydration.  Negative for COVID, flu, RSV.  Lactic acid normal.  Will admit to hospitalist for hypoxia in the setting of pneumonia.  The patient is on the cardiac monitor to evaluate for evidence of arrhythmia  and/or significant heart rate changes.     FINAL CLINICAL IMPRESSION(S) / ED DIAGNOSES   Final diagnoses:  Pneumonia of right lower lobe due to infectious organism  Acute hypoxic respiratory failure (HCC)  Malignant neoplasm of right lung, unspecified part of lung (HCC)     Rx / DC Orders   ED Discharge Orders     None        Note:  This document was prepared using Dragon voice recognition software and may  include unintentional dictation errors.   Kandee Orion, MD 02/04/24 [phone]

## 2024-02-04 NOTE — H&P (Signed)
 History and Physical    Patient: Sarah White WUJ:[id number] DOB: [birth year] DOA: 02/04/2024 DOS: the patient was seen and examined on 02/04/2024 PCP: Sarah Dupre, DO  Patient coming from: Home  Chief Complaint:  Chief Complaint  Patient presents with   Shortness of Breath   HPI: Sarah White is a 78 y.o. female with medical history significant of Non-small cell lung cancer s/p SBRT, COPD, HFrEF with last EF 40%, mitral regurgitation, hypertension, uterine carcinoma s/p hysterectomy, diet-controlled type 2 diabetes, who presents to the ED due to shortness of breath.  Mrs. Hefferan states that approximately 1 week ago, she was walking in her home when she had a ground-level fall where she landed on her right side.  Her son was able to help her up, however the next day she fell again landing on the same side.  Since then, she has been experiencing severe right rib pain and right flank pain.  1 day after the second fall, she began to experience a productive cough with shortness of breath.  She endorses fever, poor appetite but denies any nausea, vomiting.  She denies any diarrhea.  ED course: On arrival to the ED, patient was normotensive at 139/83 with heart rate of 82.  She was saturating at 93% on 3 L.  She was afebrile at 98.5.  Initial workup notable for sodium 132, bicarb 23, BUN 27, creat 1.03, total bilirubin 2.9 and GFR 57. CBC still pending. COVID-19, influenza and RSV PCR negative. Chest xray with dense consolidation of the right lower lobe. Patient started on Azithromycin  and Rocephin , as well as Solumedrol and Duonebs. TRH contacted for admission.    Review of Systems: As mentioned in the history of present illness. All other systems reviewed and are negative.  Past Medical History:  Diagnosis Date   COPD (chronic obstructive pulmonary disease) (HCC)    Past Surgical History:  Procedure Laterality Date   HAND SURGERY Bilateral    Social History:  reports that she has been  smoking cigarettes. She has never used smokeless tobacco. She reports that she does not currently use alcohol. She reports that she does not use drugs.  Allergies  Allergen Reactions   Amlodipine Swelling   Atorvastatin Other (See Comments)    Myalgia    Codeine Nausea And Vomiting   Hydrochlorothiazide Other (See Comments)    Causes Low Potassium   Lisinopril Other (See Comments)    Causes dizziness   Metformin And Related Nausea Only and Other (See Comments)    Causes dizziness    History reviewed. No pertinent family history.  Prior to Admission medications   Medication Sig Start Date End Date Taking? Authorizing Provider  albuterol  (VENTOLIN  HFA) 108 (90 Base) MCG/ACT inhaler Inhale 2 puffs into the lungs every 6 (six) hours as needed for wheezing or shortness of breath. 09/20/23  Yes [provider]  buPROPion  (WELLBUTRIN  XL) 300 MG 24 hr tablet Take 300 mg by mouth daily.   Yes [provider]  carvedilol  (COREG ) 12.5 MG tablet Take 12.5 mg by mouth 2 (two) times daily. 01/21/24  Yes [provider]  clopidogrel  (PLAVIX ) 75 MG tablet Take 75 mg by mouth every morning. 01/21/24  Yes [provider]  furosemide (LASIX) 40 MG tablet Take 40 mg by mouth daily. 01/21/24  Yes [provider]  nitroGLYCERIN (NITROSTAT) 0.4 MG SL tablet Place 0.4 mg under the tongue every 5 (five) minutes as needed for chest pain. 12/31/23  Yes [provider]  ondansetron (ZOFRAN) 8 MG tablet Take 8 mg by mouth every 8 (eight) hours as needed. 01/21/24  Yes [provider]  pantoprazole  (PROTONIX ) 40 MG tablet Take 40 mg by mouth 2 (two) times daily. 01/21/24  Yes [provider]  QUEtiapine  (SEROQUEL  XR) 50 MG TB24 24 hr tablet Take 100 mg by mouth at bedtime. 01/28/24  Yes [provider]  rosuvastatin  (CRESTOR ) 20 MG tablet Take 20 mg by mouth daily. 01/21/24  Yes [provider]  sertraline  (ZOLOFT ) 100 MG tablet Take 200  mg by mouth daily. 01/21/24  Yes [provider]  traZODone  (DESYREL ) 50 MG tablet Take 50 mg by mouth at bedtime as needed. 12/09/23  Yes [provider]    Physical Exam: Vitals:   02/04/24 1407 02/04/24 1408  BP: 139/83   Pulse: 90   Resp: (!) 22   Temp: 98.5 F (36.9 C)   TempSrc: Oral   SpO2: 96%   Weight:  78.9 kg  Height:  5\' 4"  (1.626 m)   Physical Exam Vitals and nursing note reviewed.  Constitutional:      Appearance: She is obese. She is ill-appearing.  HENT:     Head: Normocephalic and atraumatic.     Mouth/Throat:     Mouth: Mucous membranes are dry.     Pharynx: Oropharynx is clear.  Eyes:     Conjunctiva/sclera: Conjunctivae normal.     Pupils: Pupils are equal, round, and reactive to light.  Cardiovascular:     Rate and Rhythm: Normal rate and regular rhythm.     Heart sounds: No murmur heard. Pulmonary:     Effort: Pulmonary effort is normal. Tachypnea present. No accessory muscle usage.     Breath sounds: Decreased breath sounds (RLL) and rhonchi (Throughout) present. No wheezing or rales.  Chest:     Comments: Right lower chest wall tender both on the lateral and posterior sides. No bruising noted.  Abdominal:     Palpations: Abdomen is soft.     Tenderness: There is abdominal tenderness (RUQ near chest wall).  Musculoskeletal:     Right lower leg: No edema.     Left lower leg: No edema.  Skin:    General: Skin is warm and dry.  Neurological:     General: No focal deficit present.     Mental Status: She is alert and oriented to person, place, and time.  Psychiatric:        Mood and Affect: Mood normal.        Behavior: Behavior normal.    Data Reviewed: CBC WBC 6.7, hemoglobin 11.6, platelets 114.  CMP with sodium 132, potassium 3.7, bicarb 23, glucose 143, BUN 27, creatinine 1.03, AST 38, ALT 41, and GFR of 57 Bilirubin 2.9 INR 1.3 COVID-19, influenza and RSV PCR negative  EKG personally reviewed.  Sinus rhythm with rate  of 89.  Mild first-degree AV block.  Left bundle branch block.  Patient compared to EKG in 2022, both AV block and LBBB is chronic.  DG Chest Port 1 View Result Date: 02/04/2024 CLINICAL DATA:  Dyspnea. History of COPD and lung cancer. Clinical concern for possible sepsis. Right-sided pain. Marvell Slider twice this month. EXAM: PORTABLE CHEST 1 VIEW COMPARISON:  None Available. FINDINGS: Dense and patchy airspace opacity at the right lung base. No visible rib fracture or pneumothorax. Clear left lung. Normal-sized heart. Thoracic spine degenerative changes. IMPRESSION: Dense right basilar pneumonia. Electronically Signed   By: Catherin Closs M.D.   On:  02/04/2024 14:35   Results are pending, will review when available.  Assessment and Plan:  * CAP (community acquired pneumonia) Patient is presenting with several day history of productive cough and shortness of breath, found to have a dense consolidation involving the right lower lobe and some of the right middle lobe. Unclear if radiation would have affected this area.  Given symptoms began after fall, differential also includes aspiration.  - Continue Rocephin  and azithromycin  - Strep pneumo and Legionella urinary antigens - Guaifenesin  and Tussionex  COPD with acute exacerbation (HCC) In the setting of community-acquired pneumonia.  - S/p Solu-Medrol 125 mg once - Start prednisone  40 mg tomorrow to complete a 5-day course - DuoNebs every 6 hours - Pulmicort twice daily - Hold home bronchodilators - Pulmonary toilet  Acute hypoxic respiratory failure (HCC) Due to the above.  Currently requiring 3 L.  - Continue supplemental oxygen to maintain oxygen saturation above 88% - Wean as tolerated  Ground-level fall Patient reports 2 ground-level falls over the last week without any head trauma or loss of consciousness.  She is having significant right chest wall pain.  No rib fractures on chest x-ray, however CT chest pending  - Pain control with  Tylenol, oxycodone  and Dilaudid  - PT/OT  Non-small cell lung cancer (HCC) History of non-small cell lung cancer diagnosed earlier this year with screening CT.  She is not a candidate for bronchoscopy for tissue sampling or surgical intervention.  She has completed SBRT with her last session on April 30.  Elevated bilirubin Elevated total bilirubin at 2.9 of unclear clinical significance.  LFTs are otherwise normal.  Mild thrombocytopenia, however no suspicion for hemolysis at this time.  - Obtain differentiated bilirubin - Right upper quadrant ultrasound  HFrEF (heart failure with reduced ejection fraction) (HCC) Patient has a history of HFrEF with last EF of 40% with moderate mitral regurgitation when checked in March 2025.  She appears hypovolemic at this time.  - Hold home Lasix - Continue home GDMT as blood pressure allows - Daily weights  Thrombocytopenia (HCC) History of mild chronic thrombocytopenia, currently at baseline at 114.  CKD (chronic kidney disease), stage III (HCC) Per chart review, patient has a history of CKD stage IIIa with baseline creatinine ranging between 1.1 and 1.3, currently 1.0.  Type 2 diabetes mellitus (HCC) History of diet-controlled type 2 diabetes with last A1c of 6.5%.  No indication for SSI.  However, will monitor CBGs given steroid use.  Hypertension - Resume home regimen  Advance Care Planning:   Code Status: Do not attempt resuscitation (DNR) PRE-ARREST INTERVENTIONS DESIRED   Consults: None  Family Communication: No family at bedside  Severity of Illness: The appropriate patient status for this patient is OBSERVATION. Observation status is judged to be reasonable and necessary in order to provide the required intensity of service to ensure the patient's safety. The patient's presenting symptoms, physical exam findings, and initial radiographic and laboratory data in the context of their medical condition is felt to place them at decreased  risk for further clinical deterioration. Furthermore, it is anticipated that the patient will be medically stable for discharge from the hospital within 2 midnights of admission.   Author: Avi Body, MD 02/04/2024 5:02 PM  For on call review [email or web address].

## 2024-02-04 NOTE — ED Notes (Signed)
 Pt stated she has allergies but she cannot remember which ones. Pt family member at bedside said he would find out which ones she has

## 2024-02-04 NOTE — Progress Notes (Signed)
 Elink following for sepsis protocol.

## 2024-02-04 NOTE — Assessment & Plan Note (Addendum)
 Continue Coreg

## 2024-02-04 NOTE — Consult Note (Signed)
 CODE SEPSIS - PHARMACY COMMUNICATION  **Broad-spectrum antimicrobials should be administered within one hour of sepsis diagnosis**  Time Code Sepsis call or page was received: 1408  Antibiotics ordered: Ceftriaxone , Azithromycin   Time of first antibiotic administration: 1501  Additional action taken by pharmacy: N/A  If necessary, name of provider/nurse contacted: N/A    Will M. Alva Jewels, PharmD Clinical Pharmacist 02/04/2024 2:11 PM

## 2024-02-04 NOTE — Assessment & Plan Note (Signed)
 History of diet-controlled type 2 diabetes with last A1c of 6.5%.  No indication for SSI.  However, will monitor CBGs given steroid use.

## 2024-02-05 DIAGNOSIS — J189 Pneumonia, unspecified organism: Secondary | ICD-10-CM

## 2024-02-05 DIAGNOSIS — R7881 Bacteremia: Secondary | ICD-10-CM | POA: Diagnosis present

## 2024-02-05 DIAGNOSIS — C3491 Malignant neoplasm of unspecified part of right bronchus or lung: Secondary | ICD-10-CM

## 2024-02-05 DIAGNOSIS — J441 Chronic obstructive pulmonary disease with (acute) exacerbation: Secondary | ICD-10-CM | POA: Diagnosis not present

## 2024-02-05 DIAGNOSIS — B953 Streptococcus pneumoniae as the cause of diseases classified elsewhere: Secondary | ICD-10-CM

## 2024-02-05 DIAGNOSIS — J9601 Acute respiratory failure with hypoxia: Secondary | ICD-10-CM | POA: Diagnosis not present

## 2024-02-05 DIAGNOSIS — R918 Other nonspecific abnormal finding of lung field: Secondary | ICD-10-CM | POA: Diagnosis not present

## 2024-02-05 DIAGNOSIS — K5909 Other constipation: Secondary | ICD-10-CM | POA: Diagnosis not present

## 2024-02-05 DIAGNOSIS — I5023 Acute on chronic systolic (congestive) heart failure: Secondary | ICD-10-CM | POA: Diagnosis present

## 2024-02-05 DIAGNOSIS — Z1152 Encounter for screening for COVID-19: Secondary | ICD-10-CM | POA: Diagnosis not present

## 2024-02-05 DIAGNOSIS — Z7951 Long term (current) use of inhaled steroids: Secondary | ICD-10-CM | POA: Diagnosis not present

## 2024-02-05 DIAGNOSIS — J440 Chronic obstructive pulmonary disease with acute lower respiratory infection: Secondary | ICD-10-CM | POA: Diagnosis present

## 2024-02-05 DIAGNOSIS — E876 Hypokalemia: Secondary | ICD-10-CM | POA: Diagnosis present

## 2024-02-05 DIAGNOSIS — Z8542 Personal history of malignant neoplasm of other parts of uterus: Secondary | ICD-10-CM | POA: Diagnosis not present

## 2024-02-05 DIAGNOSIS — R008 Other abnormalities of heart beat: Secondary | ICD-10-CM | POA: Diagnosis not present

## 2024-02-05 DIAGNOSIS — Z885 Allergy status to narcotic agent status: Secondary | ICD-10-CM | POA: Diagnosis not present

## 2024-02-05 DIAGNOSIS — D696 Thrombocytopenia, unspecified: Secondary | ICD-10-CM | POA: Diagnosis present

## 2024-02-05 DIAGNOSIS — Z888 Allergy status to other drugs, medicaments and biological substances status: Secondary | ICD-10-CM | POA: Diagnosis not present

## 2024-02-05 DIAGNOSIS — I5020 Unspecified systolic (congestive) heart failure: Secondary | ICD-10-CM | POA: Diagnosis not present

## 2024-02-05 DIAGNOSIS — R17 Unspecified jaundice: Secondary | ICD-10-CM | POA: Diagnosis present

## 2024-02-05 DIAGNOSIS — N1831 Chronic kidney disease, stage 3a: Secondary | ICD-10-CM | POA: Diagnosis present

## 2024-02-05 DIAGNOSIS — B370 Candidal stomatitis: Secondary | ICD-10-CM | POA: Diagnosis not present

## 2024-02-05 DIAGNOSIS — I771 Stricture of artery: Secondary | ICD-10-CM | POA: Diagnosis not present

## 2024-02-05 DIAGNOSIS — W1830XA Fall on same level, unspecified, initial encounter: Secondary | ICD-10-CM | POA: Diagnosis present

## 2024-02-05 DIAGNOSIS — J13 Pneumonia due to Streptococcus pneumoniae: Secondary | ICD-10-CM | POA: Diagnosis present

## 2024-02-05 DIAGNOSIS — R1310 Dysphagia, unspecified: Secondary | ICD-10-CM | POA: Diagnosis present

## 2024-02-05 DIAGNOSIS — Z66 Do not resuscitate: Secondary | ICD-10-CM | POA: Diagnosis present

## 2024-02-05 DIAGNOSIS — J90 Pleural effusion, not elsewhere classified: Secondary | ICD-10-CM | POA: Diagnosis not present

## 2024-02-05 DIAGNOSIS — F17210 Nicotine dependence, cigarettes, uncomplicated: Secondary | ICD-10-CM | POA: Diagnosis present

## 2024-02-05 DIAGNOSIS — I130 Hypertensive heart and chronic kidney disease with heart failure and stage 1 through stage 4 chronic kidney disease, or unspecified chronic kidney disease: Secondary | ICD-10-CM | POA: Diagnosis present

## 2024-02-05 DIAGNOSIS — I10 Essential (primary) hypertension: Secondary | ICD-10-CM | POA: Diagnosis not present

## 2024-02-05 DIAGNOSIS — Z90710 Acquired absence of both cervix and uterus: Secondary | ICD-10-CM | POA: Diagnosis not present

## 2024-02-05 DIAGNOSIS — Z7902 Long term (current) use of antithrombotics/antiplatelets: Secondary | ICD-10-CM | POA: Diagnosis not present

## 2024-02-05 DIAGNOSIS — Z515 Encounter for palliative care: Secondary | ICD-10-CM | POA: Diagnosis not present

## 2024-02-05 DIAGNOSIS — J9600 Acute respiratory failure, unspecified whether with hypoxia or hypercapnia: Secondary | ICD-10-CM | POA: Diagnosis not present

## 2024-02-05 DIAGNOSIS — E1122 Type 2 diabetes mellitus with diabetic chronic kidney disease: Secondary | ICD-10-CM | POA: Diagnosis present

## 2024-02-05 DIAGNOSIS — Z79899 Other long term (current) drug therapy: Secondary | ICD-10-CM | POA: Diagnosis not present

## 2024-02-05 LAB — URINALYSIS, W/ REFLEX TO CULTURE (INFECTION SUSPECTED)
Bacteria, UA: NONE SEEN
Bilirubin Urine: NEGATIVE
Glucose, UA: NEGATIVE mg/dL
Ketones, ur: 20 mg/dL — AB
Leukocytes,Ua: NEGATIVE
Nitrite: NEGATIVE
Protein, ur: 100 mg/dL — AB
Specific Gravity, Urine: 1.027 (ref 1.005–1.030)
pH: 6 (ref 5.0–8.0)

## 2024-02-05 LAB — BASIC METABOLIC PANEL WITH GFR
Anion gap: 12 (ref 5–15)
BUN: 26 mg/dL — ABNORMAL HIGH (ref 8–23)
CO2: 25 mmol/L (ref 22–32)
Calcium: 8.6 mg/dL — ABNORMAL LOW (ref 8.9–10.3)
Chloride: 98 mmol/L (ref 98–111)
Creatinine, Ser: 0.82 mg/dL (ref 0.44–1.00)
GFR, Estimated: >60 mL/min (ref >60)
Glucose, Bld: 148 mg/dL — ABNORMAL HIGH (ref 70–99)
Potassium: 3.5 mmol/L (ref 3.5–5.1)
Sodium: 135 mmol/L (ref 135–145)

## 2024-02-05 LAB — CBC WITH DIFFERENTIAL/PLATELET
Abs Immature Granulocytes: 0.03 10*3/uL (ref 0.00–0.07)
Basophils Absolute: 0 10*3/uL (ref 0.0–0.1)
Basophils Relative: 0 %
Eosinophils Absolute: 0 10*3/uL (ref 0.0–0.5)
Eosinophils Relative: 0 %
HCT: 32 % — ABNORMAL LOW (ref 36.0–46.0)
Hemoglobin: 10.3 g/dL — ABNORMAL LOW (ref 12.0–15.0)
Immature Granulocytes: 1 %
Lymphocytes Relative: 3 %
Lymphs Abs: 0.1 10*3/uL — ABNORMAL LOW (ref 0.7–4.0)
MCH: 26.8 pg (ref 26.0–34.0)
MCHC: 32.2 g/dL (ref 30.0–36.0)
MCV: 83.1 fL (ref 80.0–100.0)
Monocytes Absolute: 0.2 10*3/uL (ref 0.1–1.0)
Monocytes Relative: 4 %
Neutro Abs: 4.4 10*3/uL (ref 1.7–7.7)
Neutrophils Relative %: 92 %
Platelets: 89 10*3/uL — ABNORMAL LOW (ref 150–400)
RBC: 3.85 MIL/uL — ABNORMAL LOW (ref 3.87–5.11)
RDW: 16.5 % — ABNORMAL HIGH (ref 11.5–15.5)
WBC: 4.7 10*3/uL (ref 4.0–10.5)
nRBC: 0 % (ref 0.0–0.2)

## 2024-02-05 LAB — BLOOD CULTURE ID PANEL (REFLEXED) - BCID2

## 2024-02-05 LAB — GLUCOSE, CAPILLARY
Glucose-Capillary: 123 mg/dL — ABNORMAL HIGH (ref 70–99)
Glucose-Capillary: 134 mg/dL — ABNORMAL HIGH (ref 70–99)

## 2024-02-05 LAB — HIV ANTIBODY (ROUTINE TESTING W REFLEX): HIV Screen 4th Generation wRfx: NONREACTIVE

## 2024-02-05 MED ORDER — SODIUM CHLORIDE 0.9 % IV SOLN
2.0000 g | INTRAVENOUS | Status: AC
  Administered 2024-02-06 – 2024-02-13 (×8): 2 g via INTRAVENOUS
  Filled 2024-02-05 (×8): qty 20

## 2024-02-05 NOTE — Assessment & Plan Note (Addendum)
 Right upper, right middle and right lower lobe pneumonia seen on CT scan.  Trouble swallowing likely contributing.  Patient on dysphagia 3 diet.  Continue Rocephin .

## 2024-02-05 NOTE — Hospital Course (Addendum)
 78 y.o. female with medical history significant of Non-small cell lung cancer s/p SBRT, COPD, HFrEF with last EF 40%, mitral regurgitation, hypertension, uterine carcinoma s/p hysterectomy, diet-controlled type 2 diabetes, who presents to the ED due to shortness of breath.   Sarah White states that approximately 1 week ago, she was walking in her home when she had a ground-level fall where she landed on her right side.  Her son was able to help her up, however the next day she fell again landing on the same side.  Since then, she has been experiencing severe right rib pain and right flank pain.  1 day after the second fall, she began to experience a productive cough with shortness of breath.  She endorses fever, poor appetite but denies any nausea, vomiting.  She denies any diarrhea.   ED course: On arrival to the ED, patient was normotensive at 139/83 with heart rate of 82.  She was saturating at 93% on 3 L.  She was afebrile at 98.5.  Initial workup notable for sodium 132, bicarb 23, BUN 27, creat 1.03, total bilirubin 2.9 and GFR 57. CBC still pending. COVID-19, influenza and RSV PCR negative. Chest xray with dense consolidation of the right lower lobe. Patient started on Azithromycin  and Rocephin , as well as Solumedrol and Duonebs. [facility] contacted for admission.   6/7.  Streptococcus pneumonia growing in the blood cultures.  Discontinue Zithromax  and continue Rocephin .  Follow-up sensitivities.  Continue steroids 6/8.  Pulmonary consultation.  PT recommending rehab.  Continue Rocephin . 6/9.  Continue working with physical therapy.  Continue IV antibiotics. 6/10.  Had a rough night and now on high flow nasal cannula 5 L.

## 2024-02-05 NOTE — Assessment & Plan Note (Addendum)
 Bacteremia secondary to pneumonia.  Streptococcus pneumoniae sensitive to Rocephin .  Continue Rocephin .  Repeat blood cultures negative for less than 12 hours

## 2024-02-05 NOTE — NC FL2 (Signed)
 [place]  MEDICAID FL2 LEVEL OF CARE FORM     IDENTIFICATION  Patient Name: Sarah White Birthdate: September 08, 1945 Sex: female Admission Date (Current Location): 02/04/2024  [facility] and [place] Number:  Chiropodist and Address:  [facility], [address] [place], [place] [address]      Provider Number: [id number]  Attending Physician Name and Address:  Verla Glaze, MD  Relative Name and Phone Number:  Son: Hannia Matchett, [phone]    Current Level of Care: Hospital Recommended Level of Care: Skilled Nursing Facility Prior Approval Number:    Date Approved/Denied:   PASRR Number: [id number]  Discharge Plan: SNF    Current Diagnoses: Patient Active Problem List   Diagnosis Date Noted   Bacteremia due to Streptococcus pneumoniae 02/05/2024   Multifocal pneumonia 02/05/2024   Malignant neoplasm of right lung (HCC) 02/05/2024   Non-small cell lung cancer (HCC) 02/04/2024   COPD with acute exacerbation (HCC) 02/04/2024   Acute hypoxic respiratory failure (HCC) 02/04/2024   HFrEF (heart failure with reduced ejection fraction) (HCC) 02/04/2024   Hypertension 02/04/2024   Type 2 diabetes mellitus (HCC) 02/04/2024   CKD (chronic kidney disease), stage III (HCC) 02/04/2024   Ground-level fall 02/04/2024   Elevated bilirubin 02/04/2024   Thrombocytopenia (HCC) 02/04/2024    Orientation RESPIRATION BLADDER Height & Weight     Self, Time, Situation, Place  Normal Incontinent Weight: 78.9 kg Height:  5\' 4"  (162.6 cm)  BEHAVIORAL SYMPTOMS/MOOD NEUROLOGICAL BOWEL NUTRITION STATUS      Continent Diet (Dysphagia 3, thin liquid)  AMBULATORY STATUS COMMUNICATION OF NEEDS Skin   Extensive Assist Verbally Normal                       Personal Care Assistance Level of Assistance  Bathing, Feeding, Dressing Bathing Assistance: Limited assistance Feeding assistance: Limited assistance Dressing Assistance: Limited assistance      Functional Limitations Info             SPECIAL CARE FACTORS FREQUENCY  PT (By licensed PT), OT (By licensed OT)     PT Frequency: 5 times per week OT Frequency: 5 times per week            Contractures Contractures Info: Not present    Additional Factors Info  Code Status, Allergies Code Status Info: DNR-Intervention Allergies Info: Amlodipine, Atorvastation, Codiene, Hydrochlorothiazide, Metformin, Lisinopril.           Current Medications (02/05/2024):  This is the current hospital active medication list Current Facility-Administered Medications  Medication Dose Route Frequency Provider Last Rate Last Admin   buPROPion  (WELLBUTRIN  XL) 24 hr tablet 300 mg  300 mg Oral Daily Basaraba, Iulia, MD   300 mg at 02/05/24 1051   carvedilol  (COREG ) tablet 12.5 mg  12.5 mg Oral BID WC Avi Body, MD       [START ON 02/06/2024] cefTRIAXone  (ROCEPHIN ) 2 g in sodium chloride  0.9 % 100 mL IVPB  2 g Intravenous Q24H Wieting, Richard, MD       chlorpheniramine-HYDROcodone (TUSSIONEX) 10-8 MG/5ML suspension 5 mL  5 mL Oral Q12H PRN Avi Body, MD       clopidogrel  (PLAVIX ) tablet 75 mg  75 mg Oral q morning Basaraba, Iulia, MD   75 mg at 02/05/24 1051   enoxaparin  (LOVENOX ) injection 40 mg  40 mg Subcutaneous Q24H Basaraba, Iulia, MD   40 mg at 02/04/24 2203   guaiFENesin  (MUCINEX ) 12 hr tablet 600 mg  600 mg Oral  BID Avi Body, MD   600 mg at 02/05/24 1051   HYDROmorphone  (DILAUDID ) injection 0.5 mg  0.5 mg Intravenous Q3H PRN Avi Body, MD       ipratropium-albuterol  (DUONEB) 0.5-2.5 (3) MG/3ML nebulizer solution 3 mL  3 mL Nebulization Q6H Avi Body, MD   3 mL at 02/05/24 1451   oxyCODONE  (Oxy IR/ROXICODONE ) immediate release tablet 5 mg  5 mg Oral Q6H PRN Basaraba, Iulia, MD   5 mg at 02/04/24 1714   pantoprazole  (PROTONIX ) EC tablet 40 mg  40 mg Oral BID Basaraba, Iulia, MD   40 mg at 02/05/24 1051   predniSONE  (DELTASONE ) tablet 40 mg  40 mg Oral Q  breakfast Basaraba, Iulia, MD   40 mg at 02/05/24 1051   QUEtiapine  (SEROQUEL  XR) 24 hr tablet 100 mg  100 mg Oral QHS Basaraba, Iulia, MD       rosuvastatin  (CRESTOR ) tablet 20 mg  20 mg Oral Daily Basaraba, Iulia, MD   20 mg at 02/05/24 1051   sertraline  (ZOLOFT ) tablet 200 mg  200 mg Oral Daily Basaraba, Iulia, MD   200 mg at 02/05/24 1051   traZODone  (DESYREL ) tablet 50 mg  50 mg Oral QHS PRN Basaraba, Iulia, MD         Discharge Medications: Please see discharge summary for a list of discharge medications.  Relevant Imaging Results:  Relevant Lab Results:   Additional Information SSN: [id number]  Alexandra Ice, RN

## 2024-02-05 NOTE — Progress Notes (Signed)
 Occupational Therapy Evaluation Patient Details Name: Sarah White MRN: [id number] DOB: 1946-05-29 Today's Date: 02/05/2024   History of Present Illness   Sarah White is a 78 y.o. female with medical history significant of Non-small cell lung cancer s/p SBRT, COPD, HFrEF with last EF 40%, mitral regurgitation, hypertension, uterine carcinoma s/p hysterectomy, diet-controlled type 2 diabetes, who presents to the ED due to shortness of breath.     Clinical Impressions Pt was seen for OT evaluation this date. Prior to hospital admission, pt was receiving assist from her son for IADL and some ADLs such as set up assistance for bathing. Pt reports she has a RW and cane but doesn't use them. Pt lives with her son who works during the day, they live in a one level house with 6 steps to enter. Pt presents to acute OT demonstrating impaired ADL performance and functional mobility 2/2 (See OT problem list for additional functional deficits). Pt currently requires MODA-MAXA for bed mobility, pt had difficulty following instructions and problem solving steps. Pt reports her son usually assist her with donning socks, pt declined self attempting, MAXA to don while pt seated on the EOB. Pt required MAXA for lift off from EOB + RW with her son on one side for moral support. Pt took ~5 lateral steps up towards the [facility] in prep for transitioning to a new room with staff. Pt benefited from frequent verbal/tactile cues to support transfers to improve functional mobility within the household. The pt desat during bed mobility on 2L to spo2 87%, RN in room to bump up pt to 3L to maintain Spo2 levels <90%. The pt and son report she is far from her functional baseline. The pt would benefit from skilled OT services to address noted impairments and functional limitations (see below for any additional details) in order to maximize safety and independence while minimizing falls risk and caregiver burden. OT will follow  acutely.       If plan is discharge home, recommend the following:   A little help with walking and/or transfers;A little help with bathing/dressing/bathroom;Assistance with cooking/housework;Direct supervision/assist for medications management;Direct supervision/assist for financial management;Assist for transportation     Functional Status Assessment   Patient has had a recent decline in their functional status and demonstrates the ability to make significant improvements in function in a reasonable and predictable amount of time.     Equipment Recommendations   Other (comment) (Defer to next venue of care)     Recommendations for Other Services         Precautions/Restrictions   Precautions Precautions: Fall Restrictions Weight Bearing Restrictions Per Provider Order: No     Mobility Bed Mobility Overal bed mobility: Needs Assistance Bed Mobility: Supine to Sit, Sit to Supine     Supine to sit: Mod assist, HOB elevated, Used rails Sit to supine: Max assist, HOB elevated, Used rails   General bed mobility comments: Deficits following verbal/tactile drections during bed mobillity,  trouble with sequencing and R/L discrimination    Transfers Overall transfer level: Needs assistance Equipment used: Rolling walker (2 wheels) Transfers: Sit to/from Stand Sit to Stand: Max assist           General transfer comment: MAXA initial lift off from EOB, MODA for stability in standing. Took ~5 side steps up the head of bed with use of RW + MODA, frequent verbal cues as well as physical assistance for DME management.      Balance Overall balance assessment: Needs assistance Sitting-balance  support: Feet supported, Bilateral upper extremity supported Sitting balance-Leahy Scale: Poor Sitting balance - Comments: Unable to tolerate sitting without bilateral UE support   Standing balance support: During functional activity, Bilateral upper extremity supported,  Reliant on assistive device for balance Standing balance-Leahy Scale: Poor Standing balance comment: Not used to amb with RW, noted grossly improved balance with the RW                           ADL either performed or assessed with clinical judgement   ADL Overall ADL's : Needs assistance/impaired Eating/Feeding: Set up;Bed level                   Lower Body Dressing: Maximal assistance Lower Body Dressing Details (indicate cue type and reason): Don Bil Socks while seated on EOB             Functional mobility during ADLs: Rolling walker (2 wheels);Maximal assistance General ADL Comments: MAXA don bil socks while seated on EOB, pt reports her son always assist her with socks     Vision Patient Visual Report: No change from baseline                         Pertinent Vitals/Pain Pain Assessment Pain Assessment: 0-10 Pain Score: 4  Pain Location: R hip Pain Descriptors / Indicators: Discomfort, Grimacing Pain Intervention(s): Repositioned, Monitored during session, Limited activity within patient's tolerance     Extremity/Trunk Assessment Upper Extremity Assessment Upper Extremity Assessment: Generalized weakness   Lower Extremity Assessment Lower Extremity Assessment: Generalized weakness;Defer to PT evaluation   Cervical / Trunk Assessment Cervical / Trunk Assessment: Normal   Communication Communication Communication: No apparent difficulties Factors Affecting Communication: Hearing impaired   Cognition Arousal: Alert Behavior During Therapy: WFL for tasks assessed/performed Cognition: Cognition impaired           Executive functioning impairment (select all impairments): Initiation, Problem solving, Sequencing OT - Cognition Comments: A/Ox4, difficulty following directions during mobility                 Following commands: Impaired Following commands impaired: Follows multi-step commands inconsistently, Follows  multi-step commands with increased time     Cueing  General Comments   Cueing Techniques: Gestural cues;Verbal cues;Tactile cues  Eager to return to PLOF, desat during bed mobility on 2L to spo2 87%, RN in room to bump up pt to 3L to maintain Spo2 levels <90%   Exercises Exercises: Other exercises Other Exercises Other Exercises: Edu pt/son: Role of OT eval, DME mangement, d/c planning, safe ADL completion   Shoulder Instructions      Home Living Family/patient expects to be discharged to:: Private residence Living Arrangements: Children (Son [name])) Available Help at Discharge: Family;Available PRN/intermittently Type of Home: House Home Access: Stairs to enter [employer] of Steps: 6 Entrance Stairs-Rails: Left Home Layout: One level     Bathroom Shower/Tub: Tub/shower unit         Home Equipment: Tub bench;Rolling Walker (2 wheels);Wheelchair - manual;Cane - single point          Prior Functioning/Environment Prior Level of Function : Needs assist;History of Falls (last six months)               ADLs Comments: Pt reports before getting sick she was indep in most ADL, her son assist with showering set up but she completes herself. Her son assist with all IADLs  OT Problem List: Decreased strength;Decreased activity tolerance;Impaired balance (sitting and/or standing);Decreased safety awareness;Decreased knowledge of use of DME or AE;Decreased knowledge of precautions   OT Treatment/Interventions: Self-care/ADL training;Therapeutic exercise;Energy conservation;DME and/or AE instruction;Therapeutic activities;Visual/perceptual remediation/compensation;Patient/family education;Balance training      OT Goals(Current goals can be found in the care plan section)   Acute Rehab OT Goals Patient Stated Goal: Return to PLOF OT Goal Formulation: With patient/family Time For Goal Achievement: 02/19/24 Potential to Achieve Goals: Good ADL Goals Pt  Will Perform Grooming: with contact guard assist;standing Pt Will Perform Lower Body Dressing: with contact guard assist;sit to/from stand Pt Will Transfer to Toilet: with contact guard assist;ambulating Pt Will Perform Toileting - Clothing Manipulation and hygiene: with contact guard assist;sit to/from stand   OT Frequency:  Min 2X/week    Co-evaluation              AM-PAC OT "6 Clicks" Daily Activity     Outcome Measure Help from another person eating meals?: A Little Help from another person taking care of personal grooming?: A Little Help from another person toileting, which includes using toliet, bedpan, or urinal?: A Lot Help from another person bathing (including washing, rinsing, drying)?: A Lot Help from another person to put on and taking off regular upper body clothing?: A Little Help from another person to put on and taking off regular lower body clothing?: A Lot 6 Click Score: 15   End of Session Equipment Utilized During Treatment: Gait belt;Rolling walker (2 wheels);Oxygen Nurse Communication: Mobility status  Activity Tolerance: Patient tolerated treatment well Patient left: in bed;with bed alarm set;with nursing/sitter in room;with family/visitor present;with call bell/phone within reach  OT Visit Diagnosis: Unsteadiness on feet (R26.81);Other abnormalities of gait and mobility (R26.89);Muscle weakness (generalized) (M62.81);Repeated falls (R29.6)                Time: 6962-9528 OT Time Calculation (min): 27 min Charges:  OT General Charges $OT Visit: 1 Visit OT Evaluation $OT Eval Moderate Complexity: 1 Mod OT Treatments $Self Care/Home Management : 8-22 mins  Rosaria Common M.S. OTR/L  02/05/24, 12:36 PM

## 2024-02-05 NOTE — Evaluation (Signed)
 Physical Therapy Evaluation Patient Details Name: Sarah White MRN: [id number] DOB: 1946/01/20 Today's Date: 02/05/2024  History of Present Illness  Sarah White is a 78 y.o. female with medical history significant of Non-small cell lung cancer s/p SBRT, COPD, HFrEF with last EF 40%, mitral regurgitation, hypertension, uterine carcinoma s/p hysterectomy, diet-controlled type 2 diabetes, who presents to the ED due to shortness of breath.   Clinical Impression  Pt received in Semi-Fowler's position and agreeable to therapy.  Pt agreeable to attempt to sit in recliner, noting it would likely feel good.  Pt required physical assistance to come to the EOB and then once seated, needed to use UE's to support herself at the EOB.  Pt then required maxA to come upright, but once in standing, was more stable utilizing the RW and did not need physical assistance to stand.  Pt did have one instance of unexpected sitting back in the bed in order to void, however did not actually void.  Unsure if cognitively able to determine when needing to go to the bathroom.  CGA utilized to assist with steps and transfer to the recliner.  Pt left in recliner with all needs met and call bell within reach.  Pt took several rest breaks throughout due to coughing and breathing difficulties.   On RA with bed mobility, pt dropped to 82%.  Pt able to return to >90% with 3% of SpO2 (pt on 3 when arriving to the room).     If plan is discharge home, recommend the following: A lot of help with walking and/or transfers;A lot of help with bathing/dressing/bathroom;Help with stairs or ramp for entrance;Assist for transportation   Can travel by private vehicle   No    Equipment Recommendations Other (comment) (TBD)  Recommendations for Other Services       Functional Status Assessment Patient has had a recent decline in their functional status and demonstrates the ability to make significant improvements in function in a reasonable  and predictable amount of time.     Precautions / Restrictions Precautions Precautions: Fall Restrictions Weight Bearing Restrictions Per Provider Order: No      Mobility  Bed Mobility Overal bed mobility: Needs Assistance Bed Mobility: Supine to Sit, Sit to Supine     Supine to sit: Mod assist, HOB elevated, Used rails Sit to supine: Max assist, HOB elevated, Used rails   General bed mobility comments: Deficits following verbal/tactile drections during bed mobillity and taking frequent rest breaks due to breathing/phlegm    Transfers Overall transfer level: Needs assistance Equipment used: Rolling walker (2 wheels) Transfers: Sit to/from Stand Sit to Stand: Max assist           General transfer comment: maxA to come upright into standing position; pt sat unexpectedly because she needed to void, however never did, so somewhat confused when needing to get the void.    Ambulation/Gait Ambulation/Gait assistance: Contact guard assist Gait Distance (Feet): 4 Feet Assistive device: Rolling walker (2 wheels) Gait Pattern/deviations: Step-to pattern Gait velocity: decreased     General Gait Details: small steps forward and lateral to the recliner with use of the RW.  Stairs            Wheelchair Mobility     Tilt Bed    Modified Rankin (Stroke Patients Only)       Balance Overall balance assessment: Needs assistance Sitting-balance support: Feet supported, Bilateral upper extremity supported Sitting balance-Leahy Scale: Poor Sitting balance - Comments: Unable to tolerate sitting without  bilateral UE support   Standing balance support: During functional activity, Bilateral upper extremity supported, Reliant on assistive device for balance Standing balance-Leahy Scale: Poor Standing balance comment: Better balance when in standing.                             Pertinent Vitals/Pain Pain Assessment Pain Assessment: Faces Faces Pain Scale:  Hurts little more Pain Location: R hip Pain Descriptors / Indicators: Discomfort, Grimacing Pain Intervention(s): Limited activity within patient's tolerance, Monitored during session, Repositioned    Home Living Family/patient expects to be discharged to:: Private residence Living Arrangements: Children (Son [name])) Available Help at Discharge: Family;Available PRN/intermittently Type of Home: House Home Access: Stairs to enter Entrance Stairs-Rails: Left Entrance Stairs-Number of Steps: 6   Home Layout: One level Home Equipment: Tub bench;Rolling Walker (2 wheels);Wheelchair - manual;Cane - single point      Prior Function Prior Level of Function : Needs assist;History of Falls (last six months)               ADLs Comments: Pt reports before getting sick she was indep in most ADL, her son assist with showering set up but she completes herself. Her son assist with all IADLs     Extremity/Trunk Assessment   Upper Extremity Assessment Upper Extremity Assessment: Generalized weakness    Lower Extremity Assessment Lower Extremity Assessment: Generalized weakness    Cervical / Trunk Assessment Cervical / Trunk Assessment: Normal  Communication   Communication Communication: No apparent difficulties Factors Affecting Communication: Hearing impaired    Cognition Arousal: Alert Behavior During Therapy: WFL for tasks assessed/performed                             Following commands: Impaired Following commands impaired: Follows multi-step commands inconsistently, Follows multi-step commands with increased time     Cueing Cueing Techniques: Gestural cues, Verbal cues, Tactile cues     General Comments General comments (skin integrity, edema, etc.): desat during bed mobility on RA to SpO2 82%, Bumped back to 3.5L of O2 and was able to remain >90%    Exercises     Assessment/Plan    PT Assessment Patient needs continued PT services  PT Problem List  Decreased strength;Decreased range of motion;Decreased activity tolerance;Decreased balance;Decreased mobility;Decreased safety awareness       PT Treatment Interventions DME instruction;Gait training;Stair training;Functional mobility training;Therapeutic activities;Therapeutic exercise;Balance training;Neuromuscular re-education    PT Goals (Current goals can be found in the Care Plan section)  Acute Rehab PT Goals Patient Stated Goal: to return to PLOF PT Goal Formulation: With patient Time For Goal Achievement: 02/19/24 Potential to Achieve Goals: Fair    Frequency Min 2X/week     Co-evaluation               AM-PAC PT "6 Clicks" Mobility  Outcome Measure Help needed turning from your back to your side while in a flat bed without using bedrails?: A Lot Help needed moving from lying on your back to sitting on the side of a flat bed without using bedrails?: A Lot Help needed moving to and from a bed to a chair (including a wheelchair)?: A Lot Help needed standing up from a chair using your arms (e.g., wheelchair or bedside chair)?: A Lot Help needed to walk in hospital room?: A Lot Help needed climbing 3-5 steps with a railing? : A Lot 6 Click Score:  12    End of Session Equipment Utilized During Treatment: Gait belt Activity Tolerance: Patient tolerated treatment well Patient left: in chair;with call bell/phone within reach;with chair alarm set Nurse Communication: Mobility status PT Visit Diagnosis: Unsteadiness on feet (R26.81);Other abnormalities of gait and mobility (R26.89);Muscle weakness (generalized) (M62.81);History of falling (Z91.81);Difficulty in walking, not elsewhere classified (R26.2);Pain Pain - Right/Left: Left Pain - part of body: Leg    Time: 7253-6644 PT Time Calculation (min) (ACUTE ONLY): 34 min   Charges:   PT Evaluation $PT Eval Moderate Complexity: 1 Mod PT Treatments $Therapeutic Activity: 8-22 mins PT General Charges $$ ACUTE PT  VISIT: 1 Visit         Rozanna Corner, PT, DPT Physical Therapist - [facility]  02/05/24, 2:24 PM

## 2024-02-05 NOTE — Progress Notes (Signed)
 Progress Note   Patient: Sarah White OZH:[id number] DOB: 09/12/1945 DOA: 02/04/2024     0 DOS: the patient was seen and examined on 02/05/2024   Brief hospital course: 78 y.o. female with medical history significant of Non-small cell lung cancer s/p SBRT, COPD, HFrEF with last EF 40%, mitral regurgitation, hypertension, uterine carcinoma s/p hysterectomy, diet-controlled type 2 diabetes, who presents to the ED due to shortness of breath.   [name] states that approximately 1 week ago, she was walking in her home when she had a ground-level fall where she landed on her right side.  Her son was able to help her up, however the next day she fell again landing on the same side.  Since then, she has been experiencing severe right rib pain and right flank pain.  1 day after the second fall, she began to experience a productive cough with shortness of breath.  She endorses fever, poor appetite but denies any nausea, vomiting.  She denies any diarrhea.   ED course: On arrival to the ED, patient was normotensive at 139/83 with heart rate of 82.  She was saturating at 93% on 3 L.  She was afebrile at 98.5.  Initial workup notable for sodium 132, bicarb 23, BUN 27, creat 1.03, total bilirubin 2.9 and GFR 57. CBC still pending. COVID-19, influenza and RSV PCR negative. Chest xray with dense consolidation of the right lower lobe. Patient started on Azithromycin  and Rocephin , as well as Solumedrol and Duonebs. TRH contacted for admission.   6/7.  Streptococcus pneumonia growing in the blood cultures.  Discontinue Zithromax  and continue Rocephin .  Follow-up sensitivities.  Continue steroids  Assessment and Plan: * Bacteremia due to Streptococcus pneumoniae Follow-up sensitivities of blood cultures.  Discontinue Zithromax  and continue Rocephin .  Multifocal pneumonia Right upper, right middle and right lower lobe pneumonia seen on CT scan.  Trouble swallowing likely contributing.  Patient on dysphagia 3  diet.  COPD with acute exacerbation (HCC) Continue prednisone  and nebulizer treatments.  Acute hypoxic respiratory failure (HCC) Patient does not wear oxygen at home.  ER physician documented pulse ox of 75% on room air.  Currently on 4 L.  Ground-level fall PT evaluation  Non-small cell lung cancer (HCC) History of non-small cell lung cancer diagnosed earlier this year with screening CT.  She is not a candidate for bronchoscopy for tissue sampling or surgical intervention.  She has completed SBRT with her last session on April 30.  Elevated bilirubin Elevated total bilirubin at 2.9 of unclear clinical significance.  LFTs are otherwise normal.  Mild thrombocytopenia, however no suspicion for hemolysis at this time. Check bilirubin again tomorrow  HFrEF (heart failure with reduced ejection fraction) (HCC) Patient has a history of HFrEF with last EF of 40% with moderate mitral regurgitation when checked in March 2025.  She appears euvolemic  Thrombocytopenia (HCC) Today's platelet count 89.  CKD (chronic kidney disease), stage III (HCC) Per chart review, patient has a history of CKD stage IIIa with baseline creatinine ranging between 1.1 and 1.3, currently 1.0.  Today's creatinine 0.82.  Type 2 diabetes mellitus (HCC) History of diet-controlled type 2 diabetes with last A1c of 6.5%.  No indication for SSI.  However, will monitor CBGs given steroid use.  Hypertension Continue Coreg         Subjective: Patient feeling miserable.  Does have some trouble swallowing at times.  Admitted with multifocal pneumonia.  She was found to have strep pneumoniae growing out of blood cultures.  Physical Exam:  Vitals:   02/05/24 0212 02/05/24 0519 02/05/24 0756 02/05/24 1312  BP:  (!) 145/52 124/80 (!) 121/96  Pulse:  74 73 80  Resp:  16 20 18   Temp:  98.5 F (36.9 C) 98.3 F (36.8 C) 97.9 F (36.6 C)  TempSrc:  Oral    SpO2: 91% 93% 94% 93%  Weight:      Height:       Physical  Exam HENT:     Head: Normocephalic.     Mouth/Throat:     Pharynx: No oropharyngeal exudate.  Eyes:     General: Lids are normal.     Conjunctiva/sclera: Conjunctivae normal.  Cardiovascular:     Rate and Rhythm: Normal rate and regular rhythm.     Heart sounds: Normal heart sounds, S1 normal and S2 normal.  Pulmonary:     Breath sounds: Examination of the right-middle field reveals decreased breath sounds and rhonchi. Examination of the right-lower field reveals decreased breath sounds and rhonchi. Decreased breath sounds and rhonchi present. No wheezing or rales.  Abdominal:     Palpations: Abdomen is soft.     Tenderness: There is no abdominal tenderness.  Musculoskeletal:     Right lower leg: No swelling.     Left lower leg: No swelling.  Skin:    General: Skin is warm.     Findings: No rash.  Neurological:     Mental Status: She is alert and oriented to person, place, and time.     Data Reviewed: New irregular consolidation and patchy groundglass opacities within the dependent right upper lobe middle lobe and right lower lobe with air bronchograms likely multifocal pneumonia.  Interval decrease in size of the spiculated medial right upper lobe nodule consistent with treatment response.  New subcarinal lymphadenopathy, splenomegaly, small right pleural effusion White blood Cell Count 4.7, Hemoglobin 10.3, Platelet Count 89, Creatinine 0.82, Sodium 135 Family Communication: Updated son on the phone  Disposition: Status is: Inpatient Remains inpatient appropriate because: Changed to inpatient secondary to Streptococcus pneumoniae growing out of blood cultures.  Planned Discharge Destination: Home    Time spent: 28 minutes  Author: Verla Glaze, MD 02/05/2024 1:52 PM  For on call review [email or web address].

## 2024-02-05 NOTE — Evaluation (Signed)
 Clinical/Bedside Swallow Evaluation Patient Details  Name: Sarah White MRN: [id number] Date of Birth: 04-Jun-1946  Today's Date: 02/05/2024 Time: SLP Start Time (ACUTE ONLY): 5956 SLP Stop Time (ACUTE ONLY): 1018 SLP Time Calculation (min) (ACUTE ONLY): 20 min  Past Medical History:  Past Medical History:  Diagnosis Date   COPD (chronic obstructive pulmonary disease) (HCC)    Past Surgical History:  Past Surgical History:  Procedure Laterality Date   HAND SURGERY Bilateral    HPI:  Per H&P "Sarah White is a 78 y.o. female with medical history significant of Non-small cell lung cancer s/p SBRT, COPD, HFrEF with last EF 40%, mitral regurgitation, hypertension, uterine carcinoma s/p hysterectomy, diet-controlled type 2 diabetes, who presents to the ED due to shortness of breath.     Sarah White states that approximately 1 week ago, she was walking in her home when she had a ground-level fall where she landed on her right side.  Her son was able to help her up, however the next day she fell again landing on the same side.  Since then, she has been experiencing severe right rib pain and right flank pain.  1 day after the second fall, she began to experience a productive cough with shortness of breath.  She endorses fever, poor appetite but denies any nausea, vomiting.  She denies any diarrhea." CT Chest: 1. New irregular consolidation and patchy ground-glass opacities  within the dependent right upper lobe, middle lobe, and right lower  lobe with air bronchograms, likely multifocal pneumonia. Possible  underlying posttreatment change in the setting of radiation  treatment.  2. Interval decreased size of the spiculated medial right upper lobe  nodule, consistent with treatment response.  3. New subcarinal lymphadenopathy, indeterminate.  4. Small right pleural effusion.  5. Partially imaged splenomegaly measures 13.4 cm in cranial caudal  dimension, previously 15.7 cm.  6. Aortic Atherosclerosis  (ICD10-I70.0). Coronary artery  calcifications. Assessment for potential risk factor modification,  dietary therapy or pharmacologic therapy may be warranted, if  clinically indicated.    Assessment / Plan / Recommendation  Clinical Impression  Pt seen for bedside swallow assessment in the setting of concern for aspiration PNA (per chest CT "New irregular... likely multifocal pneumonia. Possible underlying posttreatment change in the setting of radiation treatment."). Pt with congested cough at baseline, productive, and noted during/in the absence of PO intake. O2 saturations maintained at 88-90 for duration of session on 2L nasal canula (increased to 3L at end of session secondary to consistent low saturation)- with pt demonstrating SOB with repositioning in bed.   Pt seen with trials of thin liquids (via straw), puree, and regular solids. Vitals stable for duration of trials; however, congested cough persisted. Oral phase minimally prolonged related to oral clearance.   Pt reporting minimal PO intake during admission and prior to admission- secondary to "bitter" taste for all food and remote history of PNA. Based on current debility, respiratory/pulmonary status (Non-small cell lung cancer s/p SBRT), and low endurance, pt is at increased risk of aspiration, therefore recommend STRICT aspiration precautions (slow rate, small bites, elevated HOB, and alert for PO intake). Intermittent supervision for intake. Allow extended time for recovery in the setting of cough. Continue with mech soft and thin liquid diet. SLP will monitor for continued safety with current diet. MD and RN aware of recommendations.   SLP Visit Diagnosis: Dysphagia, unspecified (R13.10)    Aspiration Risk  Moderate aspiration risk    Diet Recommendation   Thin;Dysphagia 3 (mechanical  soft)  Medication Administration: Whole meds with liquid (vs in puree)    Other  Recommendations Oral Care Recommendations: Oral care  BID;Staff/trained caregiver to provide oral care     Assistance Recommended at Discharge  Intermittent supervision for PO intake  Functional Status Assessment Patient has had a recent decline in their functional status and demonstrates the ability to make significant improvements in function in a reasonable and predictable amount of time.  Frequency and Duration min 2x/week  2 weeks       Prognosis Prognosis for improved oropharyngeal function: Fair Barriers to Reach Goals:  (co-morbidities)      Swallow Study   General Date of Onset: 02/05/24 HPI: Per H&P "Sarah White is a 78 y.o. female with medical history significant of Non-small cell lung cancer s/p SBRT, COPD, HFrEF with last EF 40%, mitral regurgitation, hypertension, uterine carcinoma s/p hysterectomy, diet-controlled type 2 diabetes, who presents to the ED due to shortness of breath.     Sarah White states that approximately 1 week ago, she was walking in her home when she had a ground-level fall where she landed on her right side.  Her son was able to help her up, however the next day she fell again landing on the same side.  Since then, she has been experiencing severe right rib pain and right flank pain.  1 day after the second fall, she began to experience a productive cough with shortness of breath.  She endorses fever, poor appetite but denies any nausea, vomiting.  She denies any diarrhea." CT Chest: 1. New irregular consolidation and patchy ground-glass opacities  within the dependent right upper lobe, middle lobe, and right lower  lobe with air bronchograms, likely multifocal pneumonia. Possible  underlying posttreatment change in the setting of radiation  treatment.  2. Interval decreased size of the spiculated medial right upper lobe  nodule, consistent with treatment response.  3. New subcarinal lymphadenopathy, indeterminate.  4. Small right pleural effusion.  5. Partially imaged splenomegaly measures 13.4 cm in cranial caudal   dimension, previously 15.7 cm.  6. Aortic Atherosclerosis (ICD10-I70.0). Coronary artery  calcifications. Assessment for potential risk factor modification,  dietary therapy or pharmacologic therapy may be warranted, if  clinically indicated. Type of Study: Bedside Swallow Evaluation Previous Swallow Assessment: none in chart Diet Prior to this Study: Dysphagia 3 (mechanical soft);Thin liquids (Level 0) Temperature Spikes Noted: No (WBC 4.7) Respiratory Status: Nasal cannula (2L during assessment- RN requesting shift to 3L at end of session) History of Recent Intubation: No Behavior/Cognition: Alert;Cooperative;Pleasant mood Oral Cavity Assessment: Within Functional Limits Oral Care Completed by SLP: Yes Oral Cavity - Dentition: Dentures, top;Dentures, bottom Vision: Functional for self-feeding Self-Feeding Abilities: Able to feed self Patient Positioning: Upright in bed Baseline Vocal Quality: Normal Volitional Cough: Wet;Congested Volitional Swallow: Able to elicit    Oral/Motor/Sensory Function Overall Oral Motor/Sensory Function: Within functional limits   Ice Chips Ice chips: Not tested   Thin Liquid Thin Liquid: Within functional limits Presentation: Straw Other Comments: delayed congested cough (consistent with baseline cough)    Nectar Thick Nectar Thick Liquid: Not tested   Honey Thick Honey Thick Liquid: Not tested   Puree Puree: Within functional limits Presentation: Self Fed;Spoon   Solid     Solid: Impaired Presentation: Self Fed Oral Phase Impairments: Poor awareness of bolus (prolonged) Oral Phase Functional Implications: Prolonged oral transit Pharyngeal Phase Impairments: Cough - Delayed (consistent with baseline cough)     [place] Saqib Cazarez Clapp, MS, CCC-SLP Speech Language  Pathologist Rehab Services; [facility] [place] [phone] (ascom)   [place] J Clapp 02/05/2024,11:49 AM

## 2024-02-05 NOTE — Progress Notes (Signed)
 PHARMACY - PHYSICIAN COMMUNICATION CRITICAL VALUE ALERT - BLOOD CULTURE IDENTIFICATION (BCID)  Sarah White is an 78 y.o. female who presented to [facility] on 02/04/2024 with a chief complaint of CAP   Assessment:  Strep pneumo in 1 of 4 bottles, no resistance detected.  (include suspected source if known)  Name of physician (or Provider) Contacted: Elisabeth Guild, NP   Current antibiotics: Ceftriaxone  2 gm IV Q24H and Azithromycin  500 mg IV Q24H   Changes to prescribed antibiotics recommended:  - suggested d/c Azithromycin  and continue Ceftriaxone .  NP wants to continue current abx for now.   Results for orders placed or performed during the hospital encounter of 02/04/24  Blood Culture ID Panel (Reflexed) (Collected: 02/04/2024  2:11 PM)  Result Value Ref Range   Enterococcus faecalis NOT DETECTED NOT DETECTED   Enterococcus Faecium NOT DETECTED NOT DETECTED   Listeria monocytogenes NOT DETECTED NOT DETECTED   Staphylococcus species NOT DETECTED NOT DETECTED   Staphylococcus aureus (BCID) NOT DETECTED NOT DETECTED   Staphylococcus epidermidis NOT DETECTED NOT DETECTED   Staphylococcus lugdunensis NOT DETECTED NOT DETECTED   Streptococcus species DETECTED (A) NOT DETECTED   Streptococcus agalactiae NOT DETECTED NOT DETECTED   Streptococcus pneumoniae DETECTED (A) NOT DETECTED   Streptococcus pyogenes NOT DETECTED NOT DETECTED   A.calcoaceticus-baumannii NOT DETECTED NOT DETECTED   Bacteroides fragilis NOT DETECTED NOT DETECTED   Enterobacterales NOT DETECTED NOT DETECTED   Enterobacter cloacae complex NOT DETECTED NOT DETECTED   Escherichia coli NOT DETECTED NOT DETECTED   Klebsiella aerogenes NOT DETECTED NOT DETECTED   Klebsiella oxytoca NOT DETECTED NOT DETECTED   Klebsiella pneumoniae NOT DETECTED NOT DETECTED   Proteus species NOT DETECTED NOT DETECTED   Salmonella species NOT DETECTED NOT DETECTED   Serratia marcescens NOT DETECTED NOT DETECTED   Haemophilus influenzae  NOT DETECTED NOT DETECTED   Neisseria meningitidis NOT DETECTED NOT DETECTED   Pseudomonas aeruginosa NOT DETECTED NOT DETECTED   Stenotrophomonas maltophilia NOT DETECTED NOT DETECTED   Candida albicans NOT DETECTED NOT DETECTED   Candida auris NOT DETECTED NOT DETECTED   Candida glabrata NOT DETECTED NOT DETECTED   Candida krusei NOT DETECTED NOT DETECTED   Candida parapsilosis NOT DETECTED NOT DETECTED   Candida tropicalis NOT DETECTED NOT DETECTED   Cryptococcus neoformans/gattii NOT DETECTED NOT DETECTED    Johnson Arizola D 02/05/2024  3:42 AM

## 2024-02-05 NOTE — TOC Progression Note (Signed)
 Transition of Care [facility]) - Progression Note    Patient Details  Name: Sarah White MRN: [id number] Date of Birth: [birth year]  [facility] CM/SW Contact  Alexandra Ice, RN Phone Number: 02/05/2024, 3:46 PM  Clinical Narrative:     Patient recommended for short term rehab. PASRR, FL2 pending MD signature, and bedsearch completed. Patient pending bed offers.        Expected Discharge Plan and Services                                               Social Determinants of Health (SDOH) Interventions SDOH Screenings   Food Insecurity: No Food Insecurity (02/04/2024)  Housing: Unknown (02/04/2024)  Transportation Needs: No Transportation Needs (02/04/2024)  Utilities: Not At Risk (02/04/2024)  Social Connections: Unknown (02/04/2024)  Tobacco Use: High Risk (02/04/2024)    Readmission Risk Interventions     No data to display

## 2024-02-06 DIAGNOSIS — E876 Hypokalemia: Secondary | ICD-10-CM | POA: Insufficient documentation

## 2024-02-06 DIAGNOSIS — J189 Pneumonia, unspecified organism: Secondary | ICD-10-CM | POA: Diagnosis not present

## 2024-02-06 DIAGNOSIS — J9601 Acute respiratory failure with hypoxia: Secondary | ICD-10-CM | POA: Diagnosis not present

## 2024-02-06 DIAGNOSIS — J441 Chronic obstructive pulmonary disease with (acute) exacerbation: Secondary | ICD-10-CM | POA: Diagnosis not present

## 2024-02-06 DIAGNOSIS — R7881 Bacteremia: Secondary | ICD-10-CM | POA: Diagnosis not present

## 2024-02-06 DIAGNOSIS — B953 Streptococcus pneumoniae as the cause of diseases classified elsewhere: Secondary | ICD-10-CM | POA: Diagnosis not present

## 2024-02-06 LAB — COMPREHENSIVE METABOLIC PANEL WITH GFR
ALT: 34 U/L (ref 0–44)
AST: 39 U/L (ref 15–41)
Albumin: 2.3 g/dL — ABNORMAL LOW (ref 3.5–5.0)
Alkaline Phosphatase: 59 U/L (ref 38–126)
Anion gap: 9 (ref 5–15)
BUN: 27 mg/dL — ABNORMAL HIGH (ref 8–23)
CO2: 27 mmol/L (ref 22–32)
Calcium: 8.8 mg/dL — ABNORMAL LOW (ref 8.9–10.3)
Chloride: 98 mmol/L (ref 98–111)
Creatinine, Ser: 0.76 mg/dL (ref 0.44–1.00)
GFR, Estimated: >60 mL/min (ref >60)
Glucose, Bld: 110 mg/dL — ABNORMAL HIGH (ref 70–99)
Potassium: 3.2 mmol/L — ABNORMAL LOW (ref 3.5–5.1)
Sodium: 134 mmol/L — ABNORMAL LOW (ref 135–145)
Total Bilirubin: 0.8 mg/dL (ref 0.0–1.2)
Total Protein: 5.2 g/dL — ABNORMAL LOW (ref 6.5–8.1)

## 2024-02-06 LAB — GLUCOSE, CAPILLARY
Glucose-Capillary: 85 mg/dL (ref 70–99)
Glucose-Capillary: 141 mg/dL — ABNORMAL HIGH (ref 70–99)
Glucose-Capillary: 167 mg/dL — ABNORMAL HIGH (ref 70–99)

## 2024-02-06 LAB — CBC
HCT: 32.2 % — ABNORMAL LOW (ref 36.0–46.0)
Hemoglobin: 10.2 g/dL — ABNORMAL LOW (ref 12.0–15.0)
MCH: 26.6 pg (ref 26.0–34.0)
MCHC: 31.7 g/dL (ref 30.0–36.0)
MCV: 84.1 fL (ref 80.0–100.0)
Platelets: 92 10*3/uL — ABNORMAL LOW (ref 150–400)
RBC: 3.83 MIL/uL — ABNORMAL LOW (ref 3.87–5.11)
RDW: 16.7 % — ABNORMAL HIGH (ref 11.5–15.5)
WBC: 5.1 10*3/uL (ref 4.0–10.5)
nRBC: 0 % (ref 0.0–0.2)

## 2024-02-06 MED ORDER — BUDESONIDE 0.5 MG/2ML IN SUSP
0.5000 mg | Freq: Two times a day (BID) | RESPIRATORY_TRACT | Status: DC
  Administered 2024-02-06 – 2024-02-15 (×19): 0.5 mg via RESPIRATORY_TRACT
  Filled 2024-02-06 (×19): qty 2

## 2024-02-06 MED ORDER — IPRATROPIUM-ALBUTEROL 0.5-2.5 (3) MG/3ML IN SOLN
3.0000 mL | RESPIRATORY_TRACT | Status: DC
  Administered 2024-02-06 (×3): 3 mL via RESPIRATORY_TRACT
  Filled 2024-02-06 (×3): qty 3

## 2024-02-06 MED ORDER — POTASSIUM CHLORIDE CRYS ER 20 MEQ PO TBCR
40.0000 meq | EXTENDED_RELEASE_TABLET | Freq: Once | ORAL | Status: AC
  Administered 2024-02-06: 40 meq via ORAL
  Filled 2024-02-06: qty 2

## 2024-02-06 MED ORDER — IPRATROPIUM-ALBUTEROL 0.5-2.5 (3) MG/3ML IN SOLN
3.0000 mL | Freq: Four times a day (QID) | RESPIRATORY_TRACT | Status: DC
  Administered 2024-02-07 (×2): 3 mL via RESPIRATORY_TRACT
  Filled 2024-02-06 (×2): qty 3

## 2024-02-06 MED ORDER — METHYLPREDNISOLONE SODIUM SUCC 40 MG IJ SOLR
40.0000 mg | Freq: Every day | INTRAMUSCULAR | Status: DC
  Administered 2024-02-06 – 2024-02-08 (×3): 40 mg via INTRAVENOUS
  Filled 2024-02-06 (×4): qty 1

## 2024-02-06 NOTE — Assessment & Plan Note (Addendum)
 Replaced

## 2024-02-06 NOTE — Plan of Care (Signed)
  Problem: Education: Goal: Knowledge of disease or condition will improve Outcome: Progressing Goal: Knowledge of the prescribed therapeutic regimen will improve Outcome: Progressing Goal: Individualized Educational Video(s) Outcome: Progressing   Problem: Respiratory: Goal: Ability to maintain a clear airway will improve Outcome: Progressing Goal: Levels of oxygenation will improve Outcome: Progressing Goal: Ability to maintain adequate ventilation will improve Outcome: Progressing

## 2024-02-06 NOTE — Progress Notes (Signed)
 Progress Note   Patient: Sarah White GEX:[id number] DOB: 30-Nov-1945 DOA: 02/04/2024     1 DOS: the patient was seen and examined on 02/06/2024   Brief hospital course: 78 y.o. female with medical history significant of Non-small cell lung cancer s/p SBRT, COPD, HFrEF with last EF 40%, mitral regurgitation, hypertension, uterine carcinoma s/p hysterectomy, diet-controlled type 2 diabetes, who presents to the ED due to shortness of breath.   [name] states that approximately 1 week ago, she was walking in her home when she had a ground-level fall where she landed on her right side.  Her son was able to help her up, however the next day she fell again landing on the same side.  Since then, she has been experiencing severe right rib pain and right flank pain.  1 day after the second fall, she began to experience a productive cough with shortness of breath.  She endorses fever, poor appetite but denies any nausea, vomiting.  She denies any diarrhea.   ED course: On arrival to the ED, patient was normotensive at 139/83 with heart rate of 82.  She was saturating at 93% on 3 L.  She was afebrile at 98.5.  Initial workup notable for sodium 132, bicarb 23, BUN 27, creat 1.03, total bilirubin 2.9 and GFR 57. CBC still pending. COVID-19, influenza and RSV PCR negative. Chest xray with dense consolidation of the right lower lobe. Patient started on Azithromycin  and Rocephin , as well as Solumedrol and Duonebs. TRH contacted for admission.   6/7.  Streptococcus pneumonia growing in the blood cultures.  Discontinue Zithromax  and continue Rocephin .  Follow-up sensitivities.  Continue steroids 6/8.  Pulmonary consultation.  PT recommending rehab.  Continue Rocephin .  Assessment and Plan: * Bacteremia due to Streptococcus pneumoniae Follow-up sensitivities of blood cultures.  Discontinue Zithromax  and continue Rocephin .  Multifocal pneumonia Right upper, right middle and right lower lobe pneumonia seen on CT  scan.  Trouble swallowing likely contributing.  Patient on dysphagia 3 diet.  COPD with acute exacerbation (HCC) Continue nebulizer treatments.  Prednisone  switched over to Solu-Medrol.  Acute hypoxic respiratory failure (HCC) Patient does not wear oxygen at home.  ER physician documented pulse ox of 75% on room air.  Currently on 5 L.  Ground-level fall PT evaluation  Non-small cell lung cancer (HCC) History of non-small cell lung cancer diagnosed earlier this year with screening CT.  She is not a candidate for bronchoscopy for tissue sampling or surgical intervention.  She has completed SBRT with her last session on April 30.  Elevated bilirubin Elevated total bilirubin on admission which now has normalized.  HFrEF (heart failure with reduced ejection fraction) (HCC) Patient has a history of HFrEF with last EF of 40% with moderate mitral regurgitation when checked in March 2025.  She appears euvolemic  Hypokalemia Replace potassium orally  Thrombocytopenia (HCC) Today's platelet count 92.  CKD (chronic kidney disease), stage III (HCC) Per chart review, patient has a history of CKD stage IIIa with baseline creatinine ranging between 1.1 and 1.3, currently 1.0.  Today's creatinine 0.76.  Type 2 diabetes mellitus (HCC) History of diet-controlled type 2 diabetes with last A1c of 6.5%.  No indication for SSI.  However, will monitor CBGs given steroid use.  Hypertension Continue Coreg         Subjective: Patient having trouble just readjusting herself in the bed.  Feels weak.  Still feels short of breath.  Still with some cough.  Admitted with pneumonia and found to be bacteremic  Physical Exam: Vitals:   02/05/24 2050 02/06/24 0224 02/06/24 0230 02/06/24 0833  BP:  (!) 136/53  (!) 145/63  Pulse:  65  71  Resp:  18  17  Temp:  97.9 F (36.6 C)  98 F (36.7 C)  TempSrc:      SpO2: 96% 99% 92% 96%  Weight:      Height:       Physical Exam HENT:     Head:  Normocephalic.     Mouth/Throat:     Pharynx: No oropharyngeal exudate.  Eyes:     General: Lids are normal.     Conjunctiva/sclera: Conjunctivae normal.  Cardiovascular:     Rate and Rhythm: Normal rate and regular rhythm.     Heart sounds: Normal heart sounds, S1 normal and S2 normal.  Pulmonary:     Breath sounds: Examination of the right-middle field reveals decreased breath sounds and rhonchi. Examination of the right-lower field reveals decreased breath sounds and rhonchi. Decreased breath sounds and rhonchi present. No wheezing or rales.  Abdominal:     Palpations: Abdomen is soft.     Tenderness: There is no abdominal tenderness.  Musculoskeletal:     Right lower leg: No swelling.     Left lower leg: No swelling.  Skin:    General: Skin is warm.     Findings: No rash.  Neurological:     Mental Status: She is alert and oriented to person, place, and time.     Data Reviewed: Sodium 134, potassium 3.2, creatinine 0.76, white blood count 5.1, hemoglobin 10.2, platelet count 92  Family Communication: Spoke with son on the phone  Disposition: Status is: Inpatient Remains inpatient appropriate because: Continue IV antibiotics for bacteremia.  Planned Discharge Destination: Rehab    Time spent: 28 minutes  Author: Verla Glaze, MD 02/06/2024 1:32 PM  For on call review [email or web address].

## 2024-02-06 NOTE — Consult Note (Signed)
 [facility] [place] [facility] Medicine Consultation      Date: 02/06/2024,   MRN# [id number] Juniper Cobey 1946-01-03       CHIEF COMPLAINT:   Admitted for pneumonia   HISTORY OF PRESENT ILLNESS  78 y.o. female with medical history significant of Non-small cell lung cancer s/p SBRT, COPD, HFrEF with last EF 40%, mitral regurgitation, hypertension, uterine carcinoma s/p hysterectomy, diet-controlled type 2 diabetes, who presents to the ED due to shortness of breath. Dx of STREP PNEUMONIA WITH BACTEREMIA  Placed on Oxygen, started IV ABX   PCCM CONSULTED TO MANAGE COPD EXACERBATION IV STEROIDS STARTED BD THERAPY ORDERED ORDERS PLACED TO ADJUST OXYGEN  CT CHEST REVIEWED IN DETAIL    PAST MEDICAL HISTORY   Past Medical History:  Diagnosis Date   COPD (chronic obstructive pulmonary disease) (HCC)    Antibiotics Given (last 72 hours)     Date/Time Action Medication Dose Rate   02/04/24 1501 New Bag/Given   cefTRIAXone  (ROCEPHIN ) 2 g in sodium chloride  0.9 % 100 mL IVPB 2 g 200 mL/hr   02/04/24 1554 New Bag/Given   azithromycin  (ZITHROMAX ) 500 mg in sodium chloride  0.9 % 250 mL IVPB 500 mg 250 mL/hr   02/05/24 1113 New Bag/Given   cefTRIAXone  (ROCEPHIN ) 2 g in sodium chloride  0.9 % 100 mL IVPB 2 g 200 mL/hr        SURGICAL HISTORY   Past Surgical History:  Procedure Laterality Date   HAND SURGERY Bilateral      FAMILY HISTORY   History reviewed. No pertinent family history.   SOCIAL HISTORY   Social History   Tobacco Use   Smoking status: Every Day    Types: Cigarettes   Smokeless tobacco: Never  Vaping Use   Vaping status: Never Used  Substance Use Topics   Alcohol use: Not Currently   Drug use: Never     MEDICATIONS    Home Medication:    Current Medication:  Current Facility-Administered Medications:    budesonide (PULMICORT) nebulizer solution 0.5 mg, 0.5 mg, Nebulization, BID, Lenoria Narine, MD   buPROPion  (WELLBUTRIN  XL) 24 hr tablet 300 mg,  300 mg, Oral, Daily, Basaraba, Iulia, MD, 300 mg at 02/06/24 0858   carvedilol  (COREG ) tablet 12.5 mg, 12.5 mg, Oral, BID WC, Basaraba, Iulia, MD, 12.5 mg at 02/06/24 0859   cefTRIAXone  (ROCEPHIN ) 2 g in sodium chloride  0.9 % 100 mL IVPB, 2 g, Intravenous, Q24H, Wieting, Richard, MD   chlorpheniramine-HYDROcodone (TUSSIONEX) 10-8 MG/5ML suspension 5 mL, 5 mL, Oral, Q12H PRN, Avi Body, MD   clopidogrel  (PLAVIX ) tablet 75 mg, 75 mg, Oral, q morning, Avi Body, MD, 75 mg at 02/06/24 0859   enoxaparin  (LOVENOX ) injection 40 mg, 40 mg, Subcutaneous, Q24H, Avi Body, MD, 40 mg at 02/05/24 2229   guaiFENesin  (MUCINEX ) 12 hr tablet 600 mg, 600 mg, Oral, BID, Avi Body, MD, 600 mg at 02/06/24 7829   HYDROmorphone  (DILAUDID ) injection 0.5 mg, 0.5 mg, Intravenous, Q3H PRN, Avi Body, MD   ipratropium-albuterol  (DUONEB) 0.5-2.5 (3) MG/3ML nebulizer solution 3 mL, 3 mL, Nebulization, Q4H, Lum Stillinger, MD   methylPREDNISolone sodium succinate (SOLU-MEDROL) 40 mg/mL injection 40 mg, 40 mg, Intravenous, Daily, Clytie Shetley, MD   oxyCODONE  (Oxy IR/ROXICODONE ) immediate release tablet 5 mg, 5 mg, Oral, Q6H PRN, Basaraba, Iulia, MD, 5 mg at 02/04/24 1714   pantoprazole  (PROTONIX ) EC tablet 40 mg, 40 mg, Oral, BID, Basaraba, Iulia, MD, 40 mg at 02/06/24 0858   QUEtiapine  (SEROQUEL  XR) 24 hr tablet 100 mg, 100  mg, Oral, QHS, Avi Body, MD, 100 mg at 02/05/24 2229   rosuvastatin  (CRESTOR ) tablet 20 mg, 20 mg, Oral, Daily, Avi Body, MD, 20 mg at 02/06/24 0859   sertraline  (ZOLOFT ) tablet 200 mg, 200 mg, Oral, Daily, Avi Body, MD, 200 mg at 02/06/24 0858   traZODone  (DESYREL ) tablet 50 mg, 50 mg, Oral, QHS PRN, Avi Body, MD, 50 mg at 02/05/24 2228    ALLERGIES   Amlodipine, Atorvastatin, Codeine, Hydrochlorothiazide, Lisinopril, and Metformin and related   BP (!) 145/63 (BP Location: Right Arm)   Pulse 71   Temp 98 F (36.7 C)   Resp 17   Ht 5\' 4"   (1.626 m)   Wt 78.9 kg   SpO2 96%   BMI 29.87 kg/m    Review of Systems: Gen:  Denies  fever, sweats, chills weight loss  HEENT: Denies blurred vision, double vision, ear pain, eye pain, hearing loss, nose bleeds, sore throat Cardiac:  No dizziness, chest pain or heaviness, chest tightness,edema, No JVD Resp:  +Cough, -sputum production, +shortness of breath,-wheezing, -hemoptysis,  Other:  All other systems negative   Physical Examination:   General Appearance: No distress  EYES PERRLA, EOM intact.   NECK Supple, No JVD Pulmonary: normal breath sounds, No wheezing.  CardiovascularNormal S1,S2.  No m/r/g.   Abdomen: Benign, Soft, non-tender. Neurology UE/LE 5/5 strength, no focal deficits Ext pulses intact, cap refill intact ALL OTHER ROS ARE NEGATIVE      IMAGING    CT CHEST WO CONTRAST Result Date: 02/04/2024 CLINICAL DATA:  History of lung cancer with several days of worsening shortness of breath associated with productive cough. * Tracking Code: BO * EXAM: CT CHEST WITHOUT CONTRAST TECHNIQUE: Multidetector CT imaging of the chest was performed following the standard protocol without IV contrast. RADIATION DOSE REDUCTION: This exam was performed according to the departmental dose-optimization program which includes automated exposure control, adjustment of the mA and/or kV according to patient size and/or use of iterative reconstruction technique. COMPARISON:  CT chest dated 09/15/2023, nuclear medicine PET dated 09/29/2023 FINDINGS: Cardiovascular: Normal heart size. No significant pericardial fluid/thickening. Great vessels are normal in course and caliber. Coronary artery calcifications and aortic atherosclerosis. Mediastinum/Nodes: Imaged thyroid gland without nodules meeting criteria for imaging follow-up by size. Normal esophagus. Increased size of a 10 mm subcarinal lymph node (2:72), previously 3 mm. Lungs/Pleura: Asymmetric volume loss of the right lung. The central  airways are patent. Layering secretions within the trachea. Moderate diffuse bronchial wall thickening. Upper lobe predominant moderate centrilobular and paraseptal emphysema. Interval decreased size of the spiculated medial right upper lobe nodule measuring 1.5 x 1.5 cm (3:51), previously 2.2 x 1.6 cm. There is new irregular consolidation and patchy ground-glass opacities within the dependent right upper lobe, middle lobe, and right lower lobe with air bronchograms. No pneumothorax. Small right pleural effusion. Upper abdomen: Partially imaged splenomegaly measures 13.4 cm in cranial caudal dimension, previously 15.7 cm. Musculoskeletal: No acute or abnormal lytic or blastic osseous lesions. Multilevel degenerative changes of the thoracic spine. Old sternotomy fracture. Unchanged partially imaged compression deformity of L1 and superior endplate compression of T3. IMPRESSION: 1. New irregular consolidation and patchy ground-glass opacities within the dependent right upper lobe, middle lobe, and right lower lobe with air bronchograms, likely multifocal pneumonia. Possible underlying posttreatment change in the setting of radiation treatment. 2. Interval decreased size of the spiculated medial right upper lobe nodule, consistent with treatment response. 3. New subcarinal lymphadenopathy, indeterminate. 4. Small right pleural effusion. 5.  Partially imaged splenomegaly measures 13.4 cm in cranial caudal dimension, previously 15.7 cm. 6. Aortic Atherosclerosis (ICD10-I70.0). Coronary artery calcifications. Assessment for potential risk factor modification, dietary therapy or pharmacologic therapy may be warranted, if clinically indicated. Electronically Signed   By: Limin  Xu M.D.   On: 02/04/2024 17:01   DG Chest Port 1 View Result Date: 02/04/2024 CLINICAL DATA:  Dyspnea. History of COPD and lung cancer. Clinical concern for possible sepsis. Right-sided pain. Marvell Slider twice this month. EXAM: PORTABLE CHEST 1 VIEW  COMPARISON:  None Available. FINDINGS: Dense and patchy airspace opacity at the right lung base. No visible rib fracture or pneumothorax. Clear left lung. Normal-sized heart. Thoracic spine degenerative changes. IMPRESSION: Dense right basilar pneumonia. Electronically Signed   By: Catherin Closs M.D.   On: 02/04/2024 14:35      ASSESSMENT/PLAN  78 yo female admitted for COPD exacerbation with RML,RLL pneumonia and Bacteremia due to Streptococcus pneumoniae  SEVERE COPD EXACERBATION -start  IV steroids as prescribed -start NEB THERAPY as prescribed -wean fio2 as needed and tolerated  o2 sat gaol 88-92%   INFECTIOUS DISEASE -follow up cultures Follow-up sensitivities of blood cultures.  Discontinue Zithromax  and continue Rocephin .   Multifocal pneumonia Right upper, right middle and right lower lobe pneumonia seen on CT scan.   Check swallow eval    Acute hypoxic respiratory failure (HCC) Patient does not wear oxygen at home.  ER physician documented pulse ox of 75% on room air.  Currently on 4 L. O2 sat goal 88-92%   Non-small cell lung cancer (HCC) History of non-small cell lung cancer diagnosed earlier this year with screening CT.  She is not a candidate for bronchoscopy for tissue sampling or surgical intervention.  She has completed SBRT with her last session on April 30.     HFrEF (heart failure with reduced ejection fraction) (HCC) Patient has a history of HFrEF with last EF of 40% with moderate mitral regurgitation when checked in March 2025.  She appears euvolemic   I spent a total of 85 minutes reviewing chart data, face-to-face evaluation with the patient, counseling and coordination of care as detailed above.     Lady Pier, M.D.  [facility]  Medical Director [facility] Medical Director [facility] Cardio-Pulmonary Department

## 2024-02-07 DIAGNOSIS — J441 Chronic obstructive pulmonary disease with (acute) exacerbation: Secondary | ICD-10-CM | POA: Diagnosis not present

## 2024-02-07 DIAGNOSIS — J189 Pneumonia, unspecified organism: Secondary | ICD-10-CM | POA: Diagnosis not present

## 2024-02-07 DIAGNOSIS — J9601 Acute respiratory failure with hypoxia: Secondary | ICD-10-CM | POA: Diagnosis not present

## 2024-02-07 DIAGNOSIS — R7881 Bacteremia: Secondary | ICD-10-CM | POA: Diagnosis not present

## 2024-02-07 LAB — CBC WITH DIFFERENTIAL/PLATELET
Abs Immature Granulocytes: 0.05 10*3/uL (ref 0.00–0.07)
Basophils Absolute: 0 10*3/uL (ref 0.0–0.1)
Basophils Relative: 0 %
Eosinophils Absolute: 0 10*3/uL (ref 0.0–0.5)
Eosinophils Relative: 0 %
HCT: 37.1 % (ref 36.0–46.0)
Hemoglobin: 11.6 g/dL — ABNORMAL LOW (ref 12.0–15.0)
Immature Granulocytes: 1 %
Lymphocytes Relative: 3 %
Lymphs Abs: 0.2 10*3/uL — ABNORMAL LOW (ref 0.7–4.0)
MCH: 26.4 pg (ref 26.0–34.0)
MCHC: 31.3 g/dL (ref 30.0–36.0)
MCV: 84.5 fL (ref 80.0–100.0)
Monocytes Absolute: 0.3 10*3/uL (ref 0.1–1.0)
Monocytes Relative: 5 %
Neutro Abs: 6.1 10*3/uL (ref 1.7–7.7)
Neutrophils Relative %: 91 %
Platelets: 114 10*3/uL — ABNORMAL LOW (ref 150–400)
RBC: 4.39 MIL/uL (ref 3.87–5.11)
RDW: 16.3 % — ABNORMAL HIGH (ref 11.5–15.5)
WBC: 6.7 10*3/uL (ref 4.0–10.5)
nRBC: 0 % (ref 0.0–0.2)

## 2024-02-07 LAB — CULTURE, BLOOD (ROUTINE X 2)

## 2024-02-07 LAB — GLUCOSE, CAPILLARY
Glucose-Capillary: 80 mg/dL (ref 70–99)
Glucose-Capillary: 98 mg/dL (ref 70–99)
Glucose-Capillary: 161 mg/dL — ABNORMAL HIGH (ref 70–99)

## 2024-02-07 MED ORDER — LACTULOSE 10 GM/15ML PO SOLN
30.0000 g | Freq: Once | ORAL | Status: AC
  Administered 2024-02-07: 30 g via ORAL

## 2024-02-07 MED ORDER — IPRATROPIUM-ALBUTEROL 0.5-2.5 (3) MG/3ML IN SOLN
3.0000 mL | Freq: Three times a day (TID) | RESPIRATORY_TRACT | Status: DC
  Administered 2024-02-07 – 2024-02-09 (×7): 3 mL via RESPIRATORY_TRACT
  Filled 2024-02-07 (×7): qty 3

## 2024-02-07 NOTE — Progress Notes (Signed)
 Progress Note   Patient: Sarah White ZOX:[id number] DOB: 15-Feb-1946 DOA: 02/04/2024     2 DOS: the patient was seen and examined on 02/07/2024   Brief hospital course: 78 y.o. female with medical history significant of Non-small cell lung cancer s/p SBRT, COPD, HFrEF with last EF 40%, mitral regurgitation, hypertension, uterine carcinoma s/p hysterectomy, diet-controlled type 2 diabetes, who presents to the ED due to shortness of breath.   Sarah White states that approximately 1 week ago, she was walking in her home when she had a ground-level fall where she landed on her right side.  Her son was able to help her up, however the next day she fell again landing on the same side.  Since then, she has been experiencing severe right rib pain and right flank pain.  1 day after the second fall, she began to experience a productive cough with shortness of breath.  She endorses fever, poor appetite but denies any nausea, vomiting.  She denies any diarrhea.   ED course: On arrival to the ED, patient was normotensive at 139/83 with heart rate of 82.  She was saturating at 93% on 3 L.  She was afebrile at 98.5.  Initial workup notable for sodium 132, bicarb 23, BUN 27, creat 1.03, total bilirubin 2.9 and GFR 57. CBC still pending. COVID-19, influenza and RSV PCR negative. Chest xray with dense consolidation of the right lower lobe. Patient started on Azithromycin  and Rocephin , as well as Solumedrol and Duonebs. TRH contacted for admission.   6/7.  Streptococcus pneumonia growing in the blood cultures.  Discontinue Zithromax  and continue Rocephin .  Follow-up sensitivities.  Continue steroids 6/8.  Pulmonary consultation.  PT recommending rehab.  Continue Rocephin . 6/9.  Continue working with physical therapy.  Continue IV antibiotics.  Assessment and Plan: * Bacteremia due to Streptococcus pneumoniae Bacteremia secondary to pneumonia.  Streptococcus pneumoniae sensitive to Rocephin .  Continue  Rocephin   Multifocal pneumonia Right upper, right middle and right lower lobe pneumonia seen on CT scan.  Trouble swallowing likely contributing.  Patient on dysphagia 3 diet.  Continue Rocephin .  COPD with acute exacerbation (HCC) Continue nebulizer treatments.  Continue Solu-Medrol  Acute hypoxic respiratory failure (HCC) Patient does not wear oxygen at home.  ER physician documented pulse ox of 75% on room air.  Currently on 4 L.  Incentive spirometer.  Continue taking deep breaths.  Ground-level fall PT recommended rehab  Non-small cell lung cancer (HCC) History of non-small cell lung cancer diagnosed earlier this year with screening CT.  She is not a candidate for bronchoscopy for tissue sampling or surgical intervention.  She has completed SBRT with her last session on April 30.  Elevated bilirubin Elevated total bilirubin on admission which now has normalized.  HFrEF (heart failure with reduced ejection fraction) (HCC) Patient has a history of HFrEF with last EF of 40% with moderate mitral regurgitation when checked in March 2025.  She appears euvolemic  Hypokalemia Replace potassium orally  Thrombocytopenia (HCC) Today's platelet count 92.  CKD (chronic kidney disease), stage III (HCC) Per chart review, patient has a history of CKD stage IIIa with baseline creatinine ranging between 1.1 and 1.3, currently 1.0.  Last creatinine 0.76.  Type 2 diabetes mellitus (HCC) History of diet-controlled type 2 diabetes with last A1c of 6.5%.  No indication for SSI.  However, will monitor CBGs given steroid use.  Hypertension Continue Coreg         Subjective: Patient still not feeling well.  Still very weak.  Not eating very much.  Constipated.  Admitted for pneumonia and bacteremia.  Physical Exam: Vitals:   02/07/24 0223 02/07/24 0325 02/07/24 0745 02/07/24 1352  BP:  (!) 128/41 (!) 132/41   Pulse:  65 70   Resp:  20 20   Temp:  98.6 F (37 C) 98 F (36.7 C)    TempSrc:  Oral Oral   SpO2: 95% 98% 99% 99%  Weight:      Height:       Physical Exam HENT:     Head: Normocephalic.     Mouth/Throat:     Pharynx: No oropharyngeal exudate.  Eyes:     General: Lids are normal.     Conjunctiva/sclera: Conjunctivae normal.  Cardiovascular:     Rate and Rhythm: Normal rate and regular rhythm.     Heart sounds: Normal heart sounds, S1 normal and S2 normal.  Pulmonary:     Breath sounds: Examination of the right-middle field reveals wheezing. Examination of the right-lower field reveals decreased breath sounds and rhonchi. Examination of the left-lower field reveals decreased breath sounds. Decreased breath sounds, wheezing and rhonchi present. No rales.  Abdominal:     Palpations: Abdomen is soft.     Tenderness: There is no abdominal tenderness.  Musculoskeletal:     Right lower leg: No swelling.     Left lower leg: No swelling.  Skin:    General: Skin is warm.     Findings: No rash.  Neurological:     Mental Status: She is alert and oriented to person, place, and time.     Data Reviewed: No new data today  Family Communication: Son at bedside  Disposition: Status is: Inpatient Remains inpatient appropriate because: Patient not eating very much.  Also complains of constipation.  Continue IV antibiotics for bacteremia.  Planned Discharge Destination: Physical therapy recommending rehab but patient wants to go home    Time spent: 28 minutes  Author: Verla Glaze, MD 02/07/2024 2:08 PM  For on call review [email or web address].

## 2024-02-07 NOTE — Care Management Important Message (Signed)
 Important Message  Patient Details  Name: Sarah White MRN: [id number] Date of Birth: 10-24-1945   Important Message Given:  Yes - Medicare IM     Anise Kerns 02/07/2024, 2:52 PM

## 2024-02-07 NOTE — Progress Notes (Signed)
 Speech Language Pathology Treatment: Dysphagia  Patient Details Name: Sarah White MRN: [id number] DOB: 1946/06/07 Today's Date: 02/07/2024 Time: 4782-9562 SLP Time Calculation (min) (ACUTE ONLY): 25 min  Assessment / Plan / Recommendation Clinical Impression  Pt seen for follow up dysphagia intervention. No updated chest imaging. Pt afebrile, WBC 5.1, now on 5L nasal canula. RN reporting pt doing well with medications whole in puree. Pt reporting continued poor appetite (minimal intake from breakfast tray left in room) and feeling "exhausted" after trying to complete a meal. Congested cough remains, with pt reporting consistent cough with minimal production.   Thin liquids trials completed with cough response x1/3. Pt denied sensation of drink "going the wrong way." Pt deferred any solids secondary to bitter taste, and reported plan for family to bring in food this PM. Education shared regarding risk for aspiration given current resp/pulmonary status and limited endurance for meal, pt reported understanding.  Risk for aspiration persists in the setting of current respiratory needs (resp failure-5L), pulmonary status (PNA, COPD exacerbation, lung cancer), minimal endurance, and deconditioning. Therefore, recommend continuation of current diet with strict aspiration precautions (slow rate, small bites, elevated HOB, and alert for PO intake). Supervision for intake to ensure compliance with precautions. MD and RN aware of continued risk for aspiration and rationale for current recommendations. SLP will continue to follow.     HPI HPI: Per H&P "Sarah White is a 78 y.o. female with medical history significant of Non-small cell lung cancer s/p SBRT, COPD, HFrEF with last EF 40%, mitral regurgitation, hypertension, uterine carcinoma s/p hysterectomy, diet-controlled type 2 diabetes, who presents to the ED due to shortness of breath.     [name] states that approximately 1 week ago, she was walking in her  home when she had a ground-level fall where she landed on her right side.  Her son was able to help her up, however the next day she fell again landing on the same side.  Since then, she has been experiencing severe right rib pain and right flank pain.  1 day after the second fall, she began to experience a productive cough with shortness of breath.  She endorses fever, poor appetite but denies any nausea, vomiting.  She denies any diarrhea." CT Chest: 1. New irregular consolidation and patchy ground-glass opacities  within the dependent right upper lobe, middle lobe, and right lower  lobe with air bronchograms, likely multifocal pneumonia. Possible  underlying posttreatment change in the setting of radiation  treatment.  2. Interval decreased size of the spiculated medial right upper lobe  nodule, consistent with treatment response.  3. New subcarinal lymphadenopathy, indeterminate.  4. Small right pleural effusion.  5. Partially imaged splenomegaly measures 13.4 cm in cranial caudal  dimension, previously 15.7 cm.  6. Aortic Atherosclerosis (ICD10-I70.0). Coronary artery  calcifications. Assessment for potential risk factor modification,  dietary therapy or pharmacologic therapy may be warranted, if  clinically indicated.      SLP Plan  Continue with current plan of care          Recommendations  Diet recommendations: Thin liquid;Dysphagia 3 (mechanical soft) Liquids provided via: Straw;Cup Medication Administration: Whole meds with puree Supervision: Patient able to self feed;Intermittent supervision to cue for compensatory strategies Compensations: Minimize environmental distractions;Slow rate;Small sips/bites Postural Changes and/or Swallow Maneuvers: Seated upright 90 degrees;Upright 30-60 min after meal                  Oral care BID;Staff/trained caregiver to provide oral care  Intermittent Supervision/Assistance Dysphagia, unspecified (R13.10)     Continue with current plan  of care    [place] Cherylanne Ardelean Clapp, MS, CCC-SLP Speech Language Pathologist Rehab Services; [facility] [phone] (ascom)   [place] J Clapp  02/07/2024, 11:37 AM

## 2024-02-07 NOTE — Progress Notes (Signed)
 Occupational Therapy Treatment Patient Details Name: Sarah White MRN: [id number] DOB: 11-May-1946 Today's Date: 02/07/2024   History of present illness Sarah White is a 78 y.o. female with medical history significant of Non-small cell lung cancer s/p SBRT, COPD, HFrEF with last EF 40%, mitral regurgitation, hypertension, uterine carcinoma s/p hysterectomy, diet-controlled type 2 diabetes, who presents to the ED due to shortness of breath.   OT comments  Upon entering the room, pt supine in bed and sleeping soundly. Pt is initially agreeable to OT intervention but having difficulty waking for participation. Pt reporting back pain and OT assisting pt with calling for pain medication. She declines all OOB and EOB tasks. Pt rolls to the L with mod A for repositioning and pillow placement for offloading. Pt demonstrates ability to perform incentive spirometer with min cuing for proper technique. Pt unable to remain awake therefore session terminated. Call bell and all needed items within reach.       If plan is discharge home, recommend the following:  A little help with walking and/or transfers;A little help with bathing/dressing/bathroom;Assistance with cooking/housework;Direct supervision/assist for medications management;Direct supervision/assist for financial management;Assist for transportation   Equipment Recommendations  Other (comment) (defer to next venue of care)       Precautions / Restrictions Precautions Precautions: Fall       Mobility Bed Mobility Overal bed mobility: Needs Assistance Bed Mobility: Rolling Rolling: Mod assist              Transfers                             ADL either performed or assessed with clinical judgement    Extremity/Trunk Assessment Upper Extremity Assessment Upper Extremity Assessment: Generalized weakness   Lower Extremity Assessment Lower Extremity Assessment: Generalized weakness        Vision Patient Visual  Report: No change from baseline           Communication Communication Communication: Impaired Factors Affecting Communication: Hearing impaired   Cognition Arousal: Alert Behavior During Therapy: WFL for tasks assessed/performed Cognition: Cognition impaired                                 Following commands impaired: Follows multi-step commands inconsistently, Follows multi-step commands with increased time      Cueing   Cueing Techniques: Gestural cues, Verbal cues, Tactile cues             Pertinent Vitals/ Pain       Pain Assessment Pain Assessment: 0-10 Pain Score: 5  Pain Location: R hip and back Pain Descriptors / Indicators: Discomfort, Grimacing Pain Intervention(s): Limited activity within patient's tolerance, Monitored during session, Repositioned, Patient requesting pain meds-RN notified         Frequency  Min 2X/week        Progress Toward Goals  OT Goals(current goals can now be found in the care plan section)  Progress towards OT goals: Progressing toward goals      AM-PAC OT "6 Clicks" Daily Activity     Outcome Measure   Help from another person eating meals?: A Little Help from another person taking care of personal grooming?: A Little Help from another person toileting, which includes using toliet, bedpan, or urinal?: A Lot Help from another person bathing (including washing, rinsing, drying)?: A Lot Help from another person to put on and taking off  regular upper body clothing?: A Little Help from another person to put on and taking off regular lower body clothing?: A Lot 6 Click Score: 15    End of Session Equipment Utilized During Treatment: Oxygen  OT Visit Diagnosis: Unsteadiness on feet (R26.81);Other abnormalities of gait and mobility (R26.89);Muscle weakness (generalized) (M62.81);Repeated falls (R29.6)   Activity Tolerance Patient limited by fatigue;Patient limited by pain   Patient Left in bed;with bed alarm  set;with call bell/phone within reach   Nurse Communication Patient requests pain meds        Time: 1610-9604 OT Time Calculation (min): 10 min  Charges: OT General Charges $OT Visit: 1 Visit OT Treatments $Therapeutic Activity: 8-22 mins  George Kinder, MS, OTR/L , CBIS ascom [phone]  02/07/24, 3:45 PM

## 2024-02-08 DIAGNOSIS — W1830XA Fall on same level, unspecified, initial encounter: Secondary | ICD-10-CM | POA: Diagnosis not present

## 2024-02-08 DIAGNOSIS — K5900 Constipation, unspecified: Secondary | ICD-10-CM | POA: Insufficient documentation

## 2024-02-08 DIAGNOSIS — J441 Chronic obstructive pulmonary disease with (acute) exacerbation: Secondary | ICD-10-CM | POA: Diagnosis not present

## 2024-02-08 DIAGNOSIS — K5909 Other constipation: Secondary | ICD-10-CM

## 2024-02-08 DIAGNOSIS — J189 Pneumonia, unspecified organism: Secondary | ICD-10-CM | POA: Diagnosis not present

## 2024-02-08 DIAGNOSIS — B370 Candidal stomatitis: Secondary | ICD-10-CM

## 2024-02-08 DIAGNOSIS — R7881 Bacteremia: Secondary | ICD-10-CM | POA: Diagnosis not present

## 2024-02-08 DIAGNOSIS — B953 Streptococcus pneumoniae as the cause of diseases classified elsewhere: Secondary | ICD-10-CM | POA: Diagnosis not present

## 2024-02-08 DIAGNOSIS — J9601 Acute respiratory failure with hypoxia: Secondary | ICD-10-CM | POA: Diagnosis not present

## 2024-02-08 LAB — CBC
HCT: 35.9 % — ABNORMAL LOW (ref 36.0–46.0)
Hemoglobin: 11 g/dL — ABNORMAL LOW (ref 12.0–15.0)
MCH: 26.9 pg (ref 26.0–34.0)
MCHC: 30.6 g/dL (ref 30.0–36.0)
MCV: 87.8 fL (ref 80.0–100.0)
Platelets: 111 10*3/uL — ABNORMAL LOW (ref 150–400)
RBC: 4.09 MIL/uL (ref 3.87–5.11)
RDW: 16.7 % — ABNORMAL HIGH (ref 11.5–15.5)
WBC: 5.7 10*3/uL (ref 4.0–10.5)
nRBC: 0 % (ref 0.0–0.2)

## 2024-02-08 LAB — BASIC METABOLIC PANEL WITH GFR
Anion gap: 10 (ref 5–15)
BUN: 18 mg/dL (ref 8–23)
CO2: 27 mmol/L (ref 22–32)
Calcium: 8.5 mg/dL — ABNORMAL LOW (ref 8.9–10.3)
Chloride: 101 mmol/L (ref 98–111)
Creatinine, Ser: 0.66 mg/dL (ref 0.44–1.00)
GFR, Estimated: >60 mL/min (ref >60)
Glucose, Bld: 87 mg/dL (ref 70–99)
Potassium: 4.2 mmol/L (ref 3.5–5.1)
Sodium: 138 mmol/L (ref 135–145)

## 2024-02-08 LAB — GLUCOSE, CAPILLARY
Glucose-Capillary: 91 mg/dL (ref 70–99)
Glucose-Capillary: 120 mg/dL — ABNORMAL HIGH (ref 70–99)
Glucose-Capillary: 195 mg/dL — ABNORMAL HIGH (ref 70–99)

## 2024-02-08 MED ORDER — HYDROMORPHONE HCL 1 MG/ML IJ SOLN: 0.5000 mg | INTRAMUSCULAR | Status: DC | PRN

## 2024-02-08 MED ORDER — FLUCONAZOLE 100 MG PO TABS
100.0000 mg | ORAL_TABLET | Freq: Every day | ORAL | Status: DC
  Administered 2024-02-09 – 2024-02-15 (×7): 100 mg via ORAL
  Filled 2024-02-08 (×7): qty 1

## 2024-02-08 MED ORDER — FLUCONAZOLE 100 MG PO TABS
200.0000 mg | ORAL_TABLET | Freq: Once | ORAL | Status: AC
  Administered 2024-02-08: 200 mg via ORAL
  Filled 2024-02-08: qty 2

## 2024-02-08 MED ORDER — LACTULOSE 10 GM/15ML PO SOLN
30.0000 g | Freq: Once | ORAL | Status: AC
  Administered 2024-02-08: 30 g via ORAL
  Filled 2024-02-08: qty 60

## 2024-02-08 MED ORDER — OXYCODONE HCL 5 MG PO TABS
5.0000 mg | ORAL_TABLET | Freq: Four times a day (QID) | ORAL | Status: DC | PRN
  Administered 2024-02-09 – 2024-02-11 (×2): 5 mg via ORAL
  Filled 2024-02-08 (×3): qty 1

## 2024-02-08 MED ORDER — TRAZODONE HCL 100 MG PO TABS
100.0000 mg | ORAL_TABLET | Freq: Every day | ORAL | Status: DC
  Administered 2024-02-08 – 2024-02-14 (×7): 100 mg via ORAL
  Filled 2024-02-08 (×7): qty 1

## 2024-02-08 NOTE — Telephone Encounter (Signed)
 Ok for E2C2 to review.  Attempted to reach patient however had to leave voicemail. If patient returns calls she should be scheduled for an appointment and determine if she ever did go to the ED.

## 2024-02-08 NOTE — Progress Notes (Addendum)
 Progress Note   Patient: Sarah White VWU:[id number] DOB: 1945-12-03 DOA: 02/04/2024     3 DOS: the patient was seen and examined on 02/08/2024   Brief hospital course: 78 y.o. female with medical history significant of Non-small cell lung cancer s/p SBRT, COPD, HFrEF with last EF 40%, mitral regurgitation, hypertension, uterine carcinoma s/p hysterectomy, diet-controlled type 2 diabetes, who presents to the ED due to shortness of breath.   Sarah White states that approximately 1 week ago, she was walking in her home when she had a ground-level fall where she landed on her right side.  Her son was able to help her up, however the next day she fell again landing on the same side.  Since then, she has been experiencing severe right rib pain and right flank pain.  1 day after the second fall, she began to experience a productive cough with shortness of breath.  She endorses fever, poor appetite but denies any nausea, vomiting.  She denies any diarrhea.   ED course: On arrival to the ED, patient was normotensive at 139/83 with heart rate of 82.  She was saturating at 93% on 3 L.  She was afebrile at 98.5.  Initial workup notable for sodium 132, bicarb 23, BUN 27, creat 1.03, total bilirubin 2.9 and GFR 57. CBC still pending. COVID-19, influenza and RSV PCR negative. Chest xray with dense consolidation of the right lower lobe. Patient started on Azithromycin  and Rocephin , as well as Solumedrol and Duonebs. TRH contacted for admission.   6/7.  Streptococcus pneumonia growing in the blood cultures.  Discontinue Zithromax  and continue Rocephin .  Follow-up sensitivities.  Continue steroids 6/8.  Pulmonary consultation.  PT recommending rehab.  Continue Rocephin . 6/9.  Continue working with physical therapy.  Continue IV antibiotics. 6/10.  Had a rough night and now on high flow nasal cannula 5 L.  Assessment and Plan: * Bacteremia due to Streptococcus pneumoniae Bacteremia secondary to pneumonia.   Streptococcus pneumoniae sensitive to Rocephin .  Continue Rocephin .  Repeat blood cultures negative for less than 12 hours  Multifocal pneumonia Right upper, right middle and right lower lobe pneumonia seen on CT scan.  Trouble swallowing likely contributing.  Patient on dysphagia 3 diet.  Continue Rocephin .  COPD with acute exacerbation (HCC) Continue nebulizer treatments.  Continue Solu-Medrol  Acute hypoxic respiratory failure (HCC) Patient does not wear oxygen at home.  ER physician documented pulse ox of 75% on room air.  Currently on 5 L high flow nasal cannula.  Incentive spirometer.  Continue taking deep breaths.  Ground-level fall PT recommended rehab  Non-small cell lung cancer (HCC) History of non-small cell lung cancer diagnosed earlier this year with screening CT.  She is not a candidate for bronchoscopy for tissue sampling or surgical intervention.  She has completed SBRT with her last session on April 30.  Elevated bilirubin Elevated total bilirubin on admission which now has normalized.  HFrEF (heart failure with reduced ejection fraction) (HCC) Patient has a history of HFrEF with last EF of 40% with moderate mitral regurgitation when checked in March 2025.  She appears euvolemic  Thrush Start Diflucan.  Constipation Will give lactulose and repeat dose  Hypokalemia Replaced  Thrombocytopenia (HCC) Today's platelet count 111.  CKD (chronic kidney disease), stage III (HCC) Per chart review, patient has a history of CKD stage IIIa with baseline creatinine ranging between 1.1 and 1.3, currently 1.0.  Last creatinine 0.66.  Type 2 diabetes mellitus (HCC) History of diet-controlled type 2 diabetes with  last A1c of 6.5%.  No indication for SSI.  However, will monitor CBGs given steroid use.  Hypertension Continue Coreg         Subjective: Patient had a rough night.  Her roommate stated she was up all night and sleeping during the day.  This morning on high  flow nasal cannula 5 L.  Son states she was able to eat yesterday.  Not much appetite today.  Having constipation.  Still having cough and shortness of breath.  Physical Exam: Vitals:   02/07/24 2032 02/08/24 0453 02/08/24 0532 02/08/24 0726  BP: (!) 132/54 (!) (P) 139/52 (!) 124/46 (!) 133/53  Pulse: 66 (P) 75 79 75  Resp: 16 (P) 18 20 16   Temp: 97.7 F (36.5 C) (P) 98.9 F (37.2 C) 98.4 F (36.9 C) 98.1 F (36.7 C)  TempSrc: Oral (P) Oral Oral Oral  SpO2: 95% (P) 94% 91% 94%  Weight:      Height:       Physical Exam HENT:     Head: Normocephalic.     Mouth/Throat:     Pharynx: No oropharyngeal exudate.  Eyes:     General: Lids are normal.     Conjunctiva/sclera: Conjunctivae normal.  Cardiovascular:     Rate and Rhythm: Normal rate and regular rhythm.     Heart sounds: Normal heart sounds, S1 normal and S2 normal.  Pulmonary:     Breath sounds: Examination of the right-middle field reveals rhonchi. Examination of the right-lower field reveals decreased breath sounds and rhonchi. Examination of the left-lower field reveals decreased breath sounds and rhonchi. Decreased breath sounds and rhonchi present. No wheezing or rales.  Abdominal:     Palpations: Abdomen is soft.     Tenderness: There is no abdominal tenderness.  Musculoskeletal:     Right lower leg: No swelling.     Left lower leg: No swelling.  Skin:    General: Skin is warm.     Findings: No rash.  Neurological:     Mental Status: She is alert and oriented to person, place, and time.     Data Reviewed: Repeat blood cultures negative for less than 24 hours Creatinine 0.66, white blood count 5.7, hemoglobin 11.0, platelet count 111 Family Communication: Updated son on the phone  Disposition: Status is: Inpatient Remains inpatient appropriate because: Very slow to respond.  Continue IV antibiotics.  Now on high flow nasal cannula 5 L.  Asked pulmonary for reevaluation.  Will get palliative care  consultation.  Planned Discharge Destination: Rehab    Time spent: 28 minutes  Author: Verla Glaze, MD 02/08/2024 2:04 PM  For on call review [email or web address].

## 2024-02-08 NOTE — Plan of Care (Signed)
°  Problem: Education: °Goal: Knowledge of disease or condition will improve °Outcome: Progressing °Goal: Knowledge of the prescribed therapeutic regimen will improve °Outcome: Progressing °Goal: Individualized Educational Video(s) °Outcome: Progressing °  °

## 2024-02-08 NOTE — Progress Notes (Addendum)
 Speech Language Pathology Treatment: Dysphagia  Patient Details Name: Sarah White MRN: [id number] DOB: 11/27/1945 Today's Date: 02/08/2024 Time: 3244-0102 SLP Time Calculation (min) (ACUTE ONLY): 10 min  Assessment / Plan / Recommendation Clinical Impression  Pt seen for follow up dysphagia intervention. No updated chest imaging. Pt afebrile, now on 5L nasal canula. Pt reporting continued poor appetite (minimal intake from breakfast tray left in room) and feeling "exhausted" after trying to complete a meal. Congested cough remains, with pt reporting consistent cough with minimal production.   Thin liquids trials completed with delayed cough on 1/5 trials. Pt denied sensation of drink "going the wrong way." Pt deferred any solids despite education. Education shared regarding risk for aspiration given current resp/pulmonary status and limited endurance for meal, pt reported understanding.  Risk for aspiration persists in the setting of current respiratory needs (resp failure-5L), pulmonary status (PNA, COPD exacerbation, lung cancer), minimal endurance, and deconditioning. Therefore, recommend continuation of current diet with strict aspiration precautions (slow rate, small bites, elevated HOB, and alert for PO intake). Supervision for intake to ensure compliance with precautions. SLP will continue to follow.     HPI HPI: Per H&P "Corazon Nickolas is a 78 y.o. female with medical history significant of Non-small cell lung cancer s/p SBRT, COPD, HFrEF with last EF 40%, mitral regurgitation, hypertension, uterine carcinoma s/p hysterectomy, diet-controlled type 2 diabetes, who presents to the ED due to shortness of breath.     Mrs. Hobbins states that approximately 1 week ago, she was walking in her home when she had a ground-level fall where she landed on her right side.  Her son was able to help her up, however the next day she fell again landing on the same side.  Since then, she has been experiencing  severe right rib pain and right flank pain.  1 day after the second fall, she began to experience a productive cough with shortness of breath.  She endorses fever, poor appetite but denies any nausea, vomiting.  She denies any diarrhea." CT Chest: 1. New irregular consolidation and patchy ground-glass opacities  within the dependent right upper lobe, middle lobe, and right lower  lobe with air bronchograms, likely multifocal pneumonia. Possible  underlying posttreatment change in the setting of radiation  treatment.  2. Interval decreased size of the spiculated medial right upper lobe  nodule, consistent with treatment response.  3. New subcarinal lymphadenopathy, indeterminate.  4. Small right pleural effusion.  5. Partially imaged splenomegaly measures 13.4 cm in cranial caudal  dimension, previously 15.7 cm.  6. Aortic Atherosclerosis (ICD10-I70.0). Coronary artery  calcifications. Assessment for potential risk factor modification,  dietary therapy or pharmacologic therapy may be warranted, if  clinically indicated.      SLP Plan  Continue with current plan of care          Recommendations  Diet recommendations: Dysphagia 3 (mechanical soft);Thin liquid Liquids provided via: Cup;Straw Medication Administration: Whole meds with puree Supervision: Patient able to self feed;Intermittent supervision to cue for compensatory strategies Compensations: Minimize environmental distractions;Slow rate;Small sips/bites Postural Changes and/or Swallow Maneuvers: Seated upright 90 degrees;Upright 30-60 min after meal                  Oral care BID;Staff/trained caregiver to provide oral care   Intermittent Supervision/Assistance Dysphagia, unspecified (R13.10)     Continue with current plan of care   Dia Forget, M.S., CCC-SLP Speech-Language Pathologist [facility] [phone] (ASCOM)   Leory Rands  Shenna Brissette  02/08/2024, 2:08 PM

## 2024-02-08 NOTE — Progress Notes (Signed)
 PT Cancellation Note  Patient Details Name: Izzabell Klasen MRN: [id number] DOB: [birth year]   Cancelled Treatment:     Multiple attempts made throughout the day. First 2 attempts, pt sleeping soundly. On third attempt pt asleep but author elected to awake. Mostly untouched lunch tray at bedside. Encouraged increased intake." I'm looking for my cake." Author assisted pt with several bites prior to pt drifting back to sleep. Acute PT will return next date with +2 assistance to promote increased activity tolerance and possible OOB activity.   Koleen Perna 02/08/2024, 8:58 PM

## 2024-02-08 NOTE — TOC Initial Note (Signed)
 Transition of Care [facility]) - Initial/Assessment Note    Patient Details  Name: Sarah White MRN: [id number] Date of Birth: [birth year]  [facility]) CM/SW Contact:    Crayton Docker, RN 02/08/2024, 10:04 AM  Clinical Narrative:                  CM to patient's room regarding TOC screening. CM explained case management role and discharge care planning. Patient verbalized agreement with screening interview. Per patient, lives with son, [name] on first floor of home. Per patient, provided permission to discuss case with son regarding SNF recommendations.  Expected Discharge Plan: Skilled Nursing Facility Barriers to Discharge: Continued Medical Work up   Patient Goals and CMS Choice    SNF   Expected Discharge Plan and Services    SNF   Living arrangements for the past 2 months: Single Family Home                   Prior Living Arrangements/Services Living arrangements for the past 2 months: Single Family Home Lives with:: Adult Children, Self Patient language and need for interpreter reviewed:: No Do you feel safe going back to the place where you live?: Yes      Need for Family Participation in Patient Care: Yes (Comment) Care giver support system in place?: Yes (comment) Current home services: DME (walker, wheelchair, cane, inhaler) Criminal Activity/Legal Involvement Pertinent to Current Situation/Hospitalization: No - Comment as needed  Activities of Daily Living   ADL Screening (condition at time of admission) Independently performs ADLs?: No Does the patient have a NEW difficulty with bathing/dressing/toileting/self-feeding that is expected to last >3 days?: Yes (Initiates electronic notice to provider for possible OT consult) Does the patient have a NEW difficulty with getting in/out of bed, walking, or climbing stairs that is expected to last >3 days?: Yes (Initiates electronic notice to provider for possible PT consult) Does the patient have a NEW  difficulty with communication that is expected to last >3 days?: No Is the patient deaf or have difficulty hearing?: Yes Does the patient have difficulty seeing, even when wearing glasses/contacts?: No Does the patient have difficulty concentrating, remembering, or making decisions?: Yes  Permission Sought/Granted Permission sought to share information with : Case Manager, Family Supports    Share Information with NAME: [name] Bonner(son)/Ron Byers(brother)     Permission granted to share info w Relationship: yes  Permission granted to share info w Contact Information: yes  Emotional Assessment Appearance:: Appears older than stated age Attitude/Demeanor/Rapport: Engaged Affect (typically observed): Calm Orientation: : Oriented to Place, Oriented to Self, Oriented to Situation Alcohol / Substance Use: Tobacco Use (Cigarettes--per patient comment, "I don't remember")    Admission diagnosis:  CAP (community acquired pneumonia) [J18.9] Malignant neoplasm of right lung, unspecified part of lung (HCC) [C34.91] Pneumonia of right lower lobe due to infectious organism [J18.9] Acute hypoxic respiratory failure (HCC) [J96.01] Bacteremia due to Streptococcus pneumoniae [R78.81, B95.3] Patient Active Problem List   Diagnosis Date Noted   Hypokalemia 02/06/2024   Bacteremia due to Streptococcus pneumoniae 02/05/2024   Multifocal pneumonia 02/05/2024   Malignant neoplasm of right lung (HCC) 02/05/2024   Non-small cell lung cancer (HCC) 02/04/2024   COPD with acute exacerbation (HCC) 02/04/2024   Acute hypoxic respiratory failure (HCC) 02/04/2024   HFrEF (heart failure with reduced ejection fraction) (HCC) 02/04/2024   Hypertension 02/04/2024   Type 2 diabetes mellitus (HCC) 02/04/2024   CKD (chronic kidney disease), stage III (HCC) 02/04/2024  Ground-level fall 02/04/2024   Elevated bilirubin 02/04/2024   Thrombocytopenia (HCC) 02/04/2024   PCP:  Solomon Dupre, DO Pharmacy:    [facility] [address]), [place] - [address] [place]) [place] [address] Phone: [phone] Fax: [phone]     Social Drivers of Health (SDOH) Social History: SDOH Screenings   Food Insecurity: No Food Insecurity (02/04/2024)  Housing: Unknown (02/04/2024)  Transportation Needs: No Transportation Needs (02/04/2024)  Utilities: Not At Risk (02/04/2024)  Social Connections: Unknown (02/04/2024)  Tobacco Use: High Risk (02/04/2024)   SDOH Interventions:     Readmission Risk Interventions    02/08/2024   10:01 AM  Readmission Risk Prevention Plan  Transportation Screening Complete  PCP or Specialist Appt within 3-5 Days --  HRI or Home Care Consult --  Social Work Consult for Recovery Care Planning/Counseling --  Palliative Care Screening Not Applicable  Medication Review [employer]) Complete

## 2024-02-08 NOTE — Assessment & Plan Note (Signed)
Start Diflucan. 

## 2024-02-08 NOTE — Assessment & Plan Note (Signed)
 Will give lactulose and repeat dose

## 2024-02-08 NOTE — Progress Notes (Signed)
 Brief inpatient history  [name] is a 78 year old female patient with a past medical history of tobacco use, suspecting COPD on Bevespi follows with Dr. Viva Grise, nonischemic cardiomyopathy with EF of 30% in 2021 who presented to [facility] on 06/06 with worsening shortness of breath.  She was found to have right-sided consolidation secondary to strep pneumonia complicated by strep pneumonia bacteremia.  Her course was complicated by acute hypoxic respiratory failure with new requirement for home oxygen.  She was started on ceftriaxone  with good clinical response.  She is afebrile and hemodynamically stable.  Currently on 5 L via nasal cannula.  Physical exam GEN no acute distress HEENT supple neck, reactive pupils, EOMI Lungs decreased air movement with prolonged expiratory phase CVS normal S1, normal S2, regular rate and rhythm Abdomen soft nontender nondistended Extremities warm well-perfused no edema  Labs and imaging were reviewed  Assessment and plan [name] is a 78 year old female patient with a past medical history of tobacco use, suspecting COPD on Bevespi follows with Dr. Viva Grise, nonischemic cardiomyopathy with EF of 30% in 2021 who presented to [facility] on 06/06 with worsening shortness of breath.  She was found to have right-sided consolidation secondary to strep pneumonia complicated by strep pneumonia bacteremia  # Acute hypoxic respiratory failure secondary to # Strep pneumo pneumonia with bacteremia # COPD on Bevespi # Heart failure with reduced EF 30% in 2021  Appears that she has been improving over the course of the 4 days however still with some shortness of breath specifically on exertion.  Anticipate this is going to be a prolonged wean off oxygen given the degree of consolidation and underlying lung function.  Plan []  Start Brovana twice daily and Yupelri daily. []  Can start weaning of steroids would recommend switching to prednisone  30 mg daily and wean down by 10 mg  every 3 days. []  Antibiotic management per primary team. []  Recommend incentive spirometer, out of bed to chair. []  Recommend oxygen evaluation on ambulation in the a.m. []  Chest x-ray in the a.m.  Pulmonary will follow with you.  I spent 50 minutes caring for this patient today, including preparing to see the patient, obtaining a medical history , reviewing a separately obtained history, performing a medically appropriate examination and/or evaluation, counseling and educating the patient/family/caregiver, documenting clinical information in the electronic health record, and independently interpreting results (not separately reported/billed) and communicating results to the patient/family/caregiver  Annitta Kindler, MD [facility] 02/08/2024 3:28 PM

## 2024-02-09 ENCOUNTER — Inpatient Hospital Stay

## 2024-02-09 DIAGNOSIS — B953 Streptococcus pneumoniae as the cause of diseases classified elsewhere: Secondary | ICD-10-CM | POA: Diagnosis not present

## 2024-02-09 DIAGNOSIS — R7881 Bacteremia: Secondary | ICD-10-CM | POA: Diagnosis not present

## 2024-02-09 LAB — CULTURE, BLOOD (ROUTINE X 2): Culture: NO GROWTH

## 2024-02-09 LAB — GLUCOSE, CAPILLARY
Glucose-Capillary: 99 mg/dL (ref 70–99)
Glucose-Capillary: 176 mg/dL — ABNORMAL HIGH (ref 70–99)
Glucose-Capillary: 182 mg/dL — ABNORMAL HIGH (ref 70–99)

## 2024-02-09 MED ORDER — FUROSEMIDE 10 MG/ML IJ SOLN
40.0000 mg | Freq: Once | INTRAMUSCULAR | Status: AC
  Administered 2024-02-09: 40 mg via INTRAVENOUS
  Filled 2024-02-09: qty 4

## 2024-02-09 MED ORDER — REVEFENACIN 175 MCG/3ML IN SOLN
175.0000 ug | Freq: Every day | RESPIRATORY_TRACT | Status: DC
  Administered 2024-02-09 – 2024-02-15 (×6): 175 ug via RESPIRATORY_TRACT
  Filled 2024-02-09 (×6): qty 3

## 2024-02-09 MED ORDER — ARFORMOTEROL TARTRATE 15 MCG/2ML IN NEBU
15.0000 ug | INHALATION_SOLUTION | Freq: Two times a day (BID) | RESPIRATORY_TRACT | Status: DC
  Administered 2024-02-09 – 2024-02-15 (×12): 15 ug via RESPIRATORY_TRACT
  Filled 2024-02-09 (×13): qty 2

## 2024-02-09 MED ORDER — PREDNISONE 20 MG PO TABS
30.0000 mg | ORAL_TABLET | Freq: Every day | ORAL | Status: DC
  Administered 2024-02-09 – 2024-02-11 (×3): 30 mg via ORAL
  Filled 2024-02-09 (×3): qty 1

## 2024-02-09 MED ORDER — MELATONIN 5 MG PO TABS
5.0000 mg | ORAL_TABLET | Freq: Every day | ORAL | Status: DC
  Administered 2024-02-09 – 2024-02-14 (×6): 5 mg via ORAL
  Filled 2024-02-09 (×6): qty 1

## 2024-02-09 NOTE — Telephone Encounter (Signed)
 Ok for E2C2 to review.  2nd attempt to reach patient unsuccessful.

## 2024-02-09 NOTE — Progress Notes (Signed)
 Progress Note   Patient: Sarah White JYN:[id number] DOB: [birth year] DOA: 02/04/2024     4 DOS: the patient was seen and examined on 02/09/2024   Brief hospital course: HPI on admission: 78 y.o. female with medical history significant of Non-small cell lung cancer s/p SBRT, COPD, HFrEF with last EF 40%, mitral regurgitation, hypertension, uterine carcinoma s/p hysterectomy, diet-controlled type 2 diabetes, who presents to the ED due to shortness of breath.   Mrs. Thede states that approximately 1 week ago, she was walking in her home when she had a ground-level fall where she landed on her right side.  Her son was able to help her up, however the next day she fell again landing on the same side.  Since then, she has been experiencing severe right rib pain and right flank pain.  1 day after the second fall, she began to experience a productive cough with shortness of breath.  She endorses fever, poor appetite but denies any nausea, vomiting.  She denies any diarrhea.   ED course: On arrival to the ED, patient was normotensive at 139/83 with heart rate of 82.  She was saturating at 93% on 3 L.  She was afebrile at 98.5.  Initial workup notable for sodium 132, bicarb 23, BUN 27, creat 1.03, total bilirubin 2.9 and GFR 57. CBC still pending. COVID-19, influenza and RSV PCR negative. Chest xray with dense consolidation of the right lower lobe. Patient started on Azithromycin  and Rocephin , as well as Solumedrol and Duonebs. TRH contacted for admission.   Patient was admitted on 02/04/2024 for further evaluation and management of acute respiratory failure with hypoxia.  Subsequently found to have Streptococcus pneumonia and bacteremia as outlined in detail below.  I assumed care 02/09/2024. Further hospital course and management as outlined below.   Assessment and Plan:  * Bacteremia and Multifocal pneumonia due to Streptococcus pneumoniae Streptococcus pneumoniae sensitive to Rocephin .   --Continue  Rocephin  --Follow repeat blood cultures - neg to date --Pulmonology following --Transition IV Solu-medrol >> Prednisone  30 mg tomorrow, taper by 10 mg every 3 days  --Incentive spirometer --OOB to chair, mobilize --Repeat CXR this AM -- some signs of fluid overload noted --Lasix 40 mg IV x 1 today --Reassess daily for further diuresis --Pulmicort nebs BID --Per Pulmonology, added Brovana neb BID, Yupelri neb daily --Mucinex  BID, PRN Tussionex    COPD with acute exacerbation  --Mgmt as above with steroids and nebs   Acute respiratory failure with hypoxia Patient does not wear oxygen at home.  ER physician documented pulse ox of 75% on room air.  Currently on 5 L high flow nasal cannula.   --Mgmt of PNA and COPD as above --Supplement O2 to maintain spO2 > 88%, wean as tolerated --Goal for discharge, hopefully < 4 L/min --Home O2 qualification in next day or two --Incentive spirometer   Ground-level fall PT recommended rehab   Non-small cell lung cancer (HCC) History of non-small cell lung cancer diagnosed earlier this year with screening CT.  She is not a candidate for bronchoscopy for tissue sampling or surgical intervention.  She has completed SBRT with her last session on April 30.   Elevated bilirubin Elevated total bilirubin on admission which now has normalized.   HFrEF (heart failure with reduced ejection fraction) (HCC) Patient has a history of HFrEF with last EF of 40% with moderate mitral regurgitation when checked in March 2025.  She appears euvolemic   Thrush Start Diflucan.   Constipation Will give lactulose and  repeat dose   Hypokalemia Replaced   Thrombocytopenia (HCC) Today's platelet count 111.   CKD (chronic kidney disease), stage III (HCC) Per chart review, patient has a history of CKD stage IIIa with baseline creatinine ranging between 1.1 and 1.3, currently 1.0.  Last creatinine 0.66.   Type 2 diabetes mellitus (HCC) History of diet-controlled  type 2 diabetes with last A1c of 6.5%.  No indication for SSI.  However, will monitor CBGs given steroid use.   Hypertension Continue Coreg       Subjective: Pt was sleeping soundly when seen this AM on rounds, woke up briefly to voice.  She denies acute complaints besides being tired. No acute events reported.  Physical Exam: Vitals:   02/08/24 1942 02/09/24 0336 02/09/24 0711 02/09/24 1528  BP:  (!) 150/49 (!) 144/52   Pulse:  71 75   Resp:  18 15   Temp:  98.2 F (36.8 C) 97.8 F (36.6 C)   TempSrc:      SpO2: 97% 91% 95% 90%  Weight:      Height:       General exam: sleeping comfortably, wakes briefly to voice, no acute distress HEENT: moist mucus membranes, hearing grossly normal  Respiratory system: CTAB generally diminished, no expiratory wheezes, normal respiratory effort at rest on 5 L/min HFNC O2. Cardiovascular system: normal S1/S2, RRR,  no pedal edema.   Gastrointestinal system: soft, NT, ND, no HSM felt, +bowel sounds. Central nervous system: no gross focal neurologic deficits, normal speech, limited exam due to somnolence Skin: dry, intact, normal temperature Psychiatry: limited exam due to somnolence  Data Reviewed:  No new labs today  Family Communication: None present. Will attempt to call as time allows.  Disposition: Status is: Inpatient Remains inpatient appropriate because: ongoing high oxygen needs, requiring IV meds including Lasix, antibiotics.   Planned Discharge Destination: Skilled nursing facility recommended    Time spent: 45 minutes  Author: Montey Apa, DO 02/09/2024 3:49 PM  For on call review [email or web address].

## 2024-02-09 NOTE — Progress Notes (Signed)
 Occupational Therapy Treatment Patient Details Name: Sarah White MRN: [id number] DOB: 1946-05-26 Today's Date: 02/09/2024   History of present illness Sarah White is a 78 y.o. female with medical history significant of Non-small cell lung cancer s/p SBRT, COPD, HFrEF with last EF 40%, mitral regurgitation, hypertension, uterine carcinoma s/p hysterectomy, diet-controlled type 2 diabetes, who presents to the ED due to shortness of breath.   OT comments  Chart reviewed to date, pt greeted semi supine in bed ,she is alert and oriented x4 however appears restless with poor one step direction following requiring multi modal cues for technique/attention. Pt reports she feels more confused than her baseline. Tx session targeted improving functional activity tolerance in prep for ADL tasks. Pt requires MOD-MAX A for bed mobility, significant posterior lean in sitting with pt tolerating sitting on edge of bed for approx 10 minutes with fluctuating CGA-MAX A.Upon return to semi supine, pt is incontinent of urine, MOD A required for rolling and MAX A for peri care/application of barrier cream with redness/breakdown noted in sacral/peri area. Area dried and moisture cream applied. Educated pt on pressure reliving techniques- HOB 30 degrees or less when resting, off loaded her to L side, ensuring skin stays dry. MAX A for denture care as they are with significant build up. Pt left semi supine in bed, nurse tech notified as pt is requesting to eat, team notified regarding ?tinged mucus. OT will continue to follow.       If plan is discharge home, recommend the following:  A little help with walking and/or transfers;A little help with bathing/dressing/bathroom;Assistance with cooking/housework;Direct supervision/assist for medications management;Direct supervision/assist for financial management;Assist for transportation   Equipment Recommendations  Other (comment) (defer to next venue of care)     Recommendations for Other Services      Precautions / Restrictions Precautions Precautions: Fall Recall of Precautions/Restrictions: Impaired Restrictions Weight Bearing Restrictions Per Provider Order: No       Mobility Bed Mobility Overal bed mobility: Needs Assistance Bed Mobility: Rolling, Supine to Sit, Sit to Supine Rolling: Mod assist, Used rails   Supine to sit: Mod assist, HOB elevated, Used rails Sit to supine: Max assist, HOB elevated, Used rails   General bed mobility comments: step by step multi modal cues for technique    Transfers Overall transfer level: Needs assistance Equipment used: Rolling walker (2 wheels) Transfers: Sit to/from Stand Sit to Stand: Total assist           General transfer comment: unable to clear buttocks off bed on this date, step by step multi modal cues for technique     Balance Overall balance assessment: Needs assistance Sitting-balance support: Feet supported, Bilateral upper extremity supported Sitting balance-Leahy Scale: Poor Sitting balance - Comments: sitting on edge of bed for approx 10 minutes; significant posterior push Postural control: Posterior lean Standing balance support: During functional activity, Bilateral upper extremity supported, Reliant on assistive device for balance Standing balance-Leahy Scale: Zero                             ADL either performed or assessed with clinical judgement   ADL Overall ADL's : Needs assistance/impaired Eating/Feeding: Set up;Bed level   Grooming: Oral care;Maximal assistance;Bed level Grooming Details (indicate cue type and reason): manage dentures     Lower Body Bathing: Maximal assistance;Bed level       Lower Body Dressing: Maximal assistance       Toileting- Clothing Manipulation  and Hygiene: Maximal assistance;Bed level Toileting - Clothing Manipulation Details (indicate cue type and reason): incontinent urine in bed, applied barrier cream             Extremity/Trunk Assessment              Vision       Perception     Praxis     Communication Communication Communication: No apparent difficulties Factors Affecting Communication: Hearing impaired   Cognition Arousal: Alert Behavior During Therapy: Impulsive Cognition: Cognition impaired     Awareness: Online awareness impaired   Attention impairment (select first level of impairment): Sustained attention Executive functioning impairment (select all impairments): Initiation, Problem solving, Sequencing                   Following commands: Impaired Following commands impaired: Follows one step commands inconsistently, Follows one step commands with increased time      Cueing   Cueing Techniques: Verbal cues  Exercises Other Exercises Other Exercises: edu re: role of OT, role of rehab, pressure injury prevention    Shoulder Instructions       General Comments spo2 >90% on 5 L via [place] throughout, pt with ?tinged mucus while spitting up- notified nurse/MD; breakdown noted in sacral area- off loaded to the L    Pertinent Vitals/ Pain       Pain Assessment Pain Assessment: Faces Faces Pain Scale: Hurts little more Pain Location: buttocks with mobility ,generalized Pain Descriptors / Indicators: Discomfort, Grimacing Pain Intervention(s): Repositioned, Monitored during session  Home Living                                          Prior Functioning/Environment              Frequency  Min 2X/week        Progress Toward Goals  OT Goals(current goals can now be found in the care plan section)  Progress towards OT goals: Progressing toward goals  Acute Rehab OT Goals Time For Goal Achievement: 02/19/24  Plan      Co-evaluation                 AM-PAC OT 6 Clicks Daily Activity     Outcome Measure   Help from another person eating meals?: A Lot Help from another person taking care of personal  grooming?: A Lot Help from another person toileting, which includes using toliet, bedpan, or urinal?: A Lot Help from another person bathing (including washing, rinsing, drying)?: A Lot Help from another person to put on and taking off regular upper body clothing?: A Lot Help from another person to put on and taking off regular lower body clothing?: A Lot 6 Click Score: 12    End of Session Equipment Utilized During Treatment: Oxygen;Rolling walker (2 wheels)  OT Visit Diagnosis: Unsteadiness on feet (R26.81);Other abnormalities of gait and mobility (R26.89);Muscle weakness (generalized) (M62.81);Repeated falls (R29.6)   Activity Tolerance Patient limited by fatigue   Patient Left in bed;with bed alarm set;with call bell/phone within reach   Nurse Communication Mobility status        Time: 4098-1191 OT Time Calculation (min): 33 min  Charges: OT General Charges $OT Visit: 1 Visit OT Treatments $Self Care/Home Management : 8-22 mins $Therapeutic Activity: 8-22 mins  Gerre Kraft, OTD OTR/L  02/09/24, 3:44 PM

## 2024-02-09 NOTE — TOC Progression Note (Signed)
 Transition of Care [facility]) - Progression Note    Patient Details  Name: Sarah White MRN: [id number] Date of Birth: 06/10/1946  [facility]) CM/SW Contact  Alexandra Ice, RN Phone Number: 02/09/2024, 12:39 PM  Clinical Narrative:     Met with patient at bedside. Patient and son chose [facility], selected facility via HUB.   Expected Discharge Plan: Skilled Nursing Facility Barriers to Discharge: Continued Medical Work up  Expected Discharge Plan and Services       Living arrangements for the past 2 months: Single Family Home                                       Social Determinants of Health (SDOH) Interventions SDOH Screenings   Food Insecurity: No Food Insecurity (02/04/2024)  Housing: Unknown (02/04/2024)  Transportation Needs: No Transportation Needs (02/04/2024)  Utilities: Not At Risk (02/04/2024)  Social Connections: Unknown (02/04/2024)  Tobacco Use: High Risk (02/04/2024)    Readmission Risk Interventions    02/08/2024   10:01 AM  Readmission Risk Prevention Plan  Transportation Screening Complete  PCP or Specialist Appt within 3-5 Days --  HRI or Home Care Consult --  Social Work Consult for Recovery Care Planning/Counseling --  Palliative Care Screening Not Applicable  Medication Review [employer]) Complete

## 2024-02-09 NOTE — Plan of Care (Signed)
  Problem: Respiratory: Goal: Levels of oxygenation will improve Outcome: Progressing Goal: Ability to maintain adequate ventilation will improve Outcome: Progressing   Problem: Respiratory: Goal: Ability to maintain adequate ventilation will improve Outcome: Progressing   Problem: Clinical Measurements: Goal: Diagnostic test results will improve Outcome: Progressing   Problem: Nutrition: Goal: Adequate nutrition will be maintained Outcome: Progressing

## 2024-02-09 NOTE — Progress Notes (Signed)
 Physical Therapy Treatment Patient Details Name: Sarah White MRN: [id number] DOB: March 30, 1946 Today's Date: 02/09/2024   History of Present Illness Sarah White is a 78 y.o. female with medical history significant of Non-small cell lung cancer s/p SBRT, COPD, HFrEF with last EF 40%, mitral regurgitation, hypertension, uterine carcinoma s/p hysterectomy, diet-controlled type 2 diabetes, who presents to the ED due to shortness of breath.    PT Comments  Pt was sitting in recliner upon arrival. She is much more alert today. A and O but slightly SOB even at rest. Pt remained on 5L HFNC throughout session with sao2 between 90-93%. She endorses some LBP that she thinks is form hospital bed. Pt is very motivated but far from her baseline. She required extensive +1 assistance to stand 4 x from recliner. Presents with severe posterior push in sitting and in standing. Only able to tolerate standing for ~ 10-20 sec prior to requesting seated rest. Slightly SOB however not worse during standing versus at rest. Congestive cough but unable to clear secretions. DC recs remain appropriate to maximize her independence and safety with all ADLs while assisting pt to PLOF.      If plan is discharge home, recommend the following: A lot of help with walking and/or transfers;A lot of help with bathing/dressing/bathroom;Help with stairs or ramp for entrance;Assist for transportation     Equipment Recommendations  Other (comment) (Defer to next level of care.)       Precautions / Restrictions Precautions Precautions: Fall Recall of Precautions/Restrictions: Intact Restrictions Weight Bearing Restrictions Per Provider Order: No     Mobility  Bed Mobility  General bed mobility comments: Pt just up from bed and currently in recliner. Per RN staff +2 assist to safely progress from EOB to recliner    Transfers Overall transfer level: Needs assistance Equipment used: Rolling walker (2 wheels) Transfers: Sit to/from  Stand Sit to Stand: Mod assist  General transfer comment: pt did stand 4 x from recliner to RW however has severe posterior push and poor carry over between transfers. Vcs for hand placement and fwd wt shift. Pt only able to tolerate standing for ~ 10-20 sec due to SOB/fatigue. sao2 remained between 90-93% on 5L HFNC    Ambulation/Gait  General Gait Details: Elected not to ambulate due to poor activity tolerance. Will need recliner follow for future ambulation attempts. Continue to recommend RN staff stand pivot to/from bed<> recliner with +2 assist for additional safety    Balance Overall balance assessment: Needs assistance Sitting-balance support: Feet supported, Bilateral upper extremity supported Sitting balance-Leahy Scale: Poor Sitting balance - Comments: posterior push even in sitting. struggles to slide hips fwd in recliner prior to standing attempts      Communication Communication Communication: No apparent difficulties  Cognition Arousal: Alert Behavior During Therapy: WFL for tasks assessed/performed   PT - Cognitive impairments: No apparent impairments      PT - Cognition Comments: Pt is A and O Following commands: Intact      Cueing Cueing Techniques: Verbal cues     General Comments General comments (skin integrity, edema, etc.): pt with productive cough but struggles to actually clear/spit out into cup. Encouraged use of flutter device and spirometer      Pertinent Vitals/Pain Pain Assessment Pain Assessment: 0-10 Pain Score: 4  Pain Location: LBP Pain Descriptors / Indicators: Discomfort, Grimacing Pain Intervention(s): Limited activity within patient's tolerance, Monitored during session, Premedicated before session, Repositioned     PT Goals (current goals can now be  found in the care plan section) Acute Rehab PT Goals Patient Stated Goal: to return to PLOF Progress towards PT goals: Progressing toward goals    Frequency    Min 2X/week        AM-PAC PT 6 Clicks Mobility   Outcome Measure  Help needed turning from your back to your side while in a flat bed without using bedrails?: A Lot Help needed moving from lying on your back to sitting on the side of a flat bed without using bedrails?: A Lot Help needed moving to and from a bed to a chair (including a wheelchair)?: A Lot Help needed standing up from a chair using your arms (e.g., wheelchair or bedside chair)?: A Lot Help needed to walk in hospital room?: A Lot Help needed climbing 3-5 steps with a railing? : A Lot 6 Click Score: 12    End of Session   Activity Tolerance: Patient tolerated treatment well;Patient limited by fatigue Patient left: in chair;with call bell/phone within reach;with chair alarm set Nurse Communication: Mobility status PT Visit Diagnosis: Unsteadiness on feet (R26.81);Other abnormalities of gait and mobility (R26.89);Muscle weakness (generalized) (M62.81);History of falling (Z91.81);Difficulty in walking, not elsewhere classified (R26.2);Pain Pain - Right/Left: Left Pain - part of body: Leg     Time: 0753-0808 PT Time Calculation (min) (ACUTE ONLY): 15 min  Charges:    $Therapeutic Activity: 8-22 mins PT General Charges $$ ACUTE PT VISIT: 1 Visit                     Chester Costa PTA 02/09/24, 9:47 AM

## 2024-02-10 ENCOUNTER — Inpatient Hospital Stay (HOSPITAL_COMMUNITY)
Admit: 2024-02-10 | Discharge: 2024-02-10 | Disposition: A | Discharge status: Home / Self Care | Attending: Internal Medicine | Admitting: Internal Medicine

## 2024-02-10 ENCOUNTER — Other Ambulatory Visit

## 2024-02-10 DIAGNOSIS — R008 Other abnormalities of heart beat: Secondary | ICD-10-CM

## 2024-02-10 DIAGNOSIS — R7881 Bacteremia: Secondary | ICD-10-CM | POA: Diagnosis not present

## 2024-02-10 DIAGNOSIS — J9601 Acute respiratory failure with hypoxia: Secondary | ICD-10-CM | POA: Diagnosis not present

## 2024-02-10 DIAGNOSIS — B953 Streptococcus pneumoniae as the cause of diseases classified elsewhere: Secondary | ICD-10-CM | POA: Diagnosis not present

## 2024-02-10 DIAGNOSIS — J441 Chronic obstructive pulmonary disease with (acute) exacerbation: Secondary | ICD-10-CM | POA: Diagnosis not present

## 2024-02-10 LAB — CBC
HCT: 37.8 % (ref 36.0–46.0)
Hemoglobin: 11.7 g/dL — ABNORMAL LOW (ref 12.0–15.0)
MCH: 27 pg (ref 26.0–34.0)
MCHC: 31 g/dL (ref 30.0–36.0)
MCV: 87.3 fL (ref 80.0–100.0)
Platelets: 129 10*3/uL — ABNORMAL LOW (ref 150–400)
RBC: 4.33 MIL/uL (ref 3.87–5.11)
RDW: 16.7 % — ABNORMAL HIGH (ref 11.5–15.5)
WBC: 10.1 10*3/uL (ref 4.0–10.5)
nRBC: 0 % (ref 0.0–0.2)

## 2024-02-10 LAB — BLOOD GAS, VENOUS
Acid-Base Excess: 11.6 mmol/L — ABNORMAL HIGH (ref 0.0–2.0)
Bicarbonate: 38 mmol/L — ABNORMAL HIGH (ref 20.0–28.0)
O2 Saturation: 74.4 %
Patient temperature: 37
pCO2, Ven: 56 mmHg (ref 44–60)
pH, Ven: 7.44 — ABNORMAL HIGH (ref 7.25–7.43)
pO2, Ven: 40 mmHg (ref 32–45)

## 2024-02-10 LAB — BASIC METABOLIC PANEL WITH GFR
Anion gap: 11 (ref 5–15)
BUN: 17 mg/dL (ref 8–23)
CO2: 31 mmol/L (ref 22–32)
Calcium: 8.4 mg/dL — ABNORMAL LOW (ref 8.9–10.3)
Chloride: 94 mmol/L — ABNORMAL LOW (ref 98–111)
Creatinine, Ser: 0.64 mg/dL (ref 0.44–1.00)
GFR, Estimated: >60 mL/min (ref >60)
Glucose, Bld: 117 mg/dL — ABNORMAL HIGH (ref 70–99)
Potassium: 3.6 mmol/L (ref 3.5–5.1)
Sodium: 136 mmol/L (ref 135–145)

## 2024-02-10 LAB — GLUCOSE, CAPILLARY
Glucose-Capillary: 103 mg/dL — ABNORMAL HIGH (ref 70–99)
Glucose-Capillary: 94 mg/dL (ref 70–99)
Glucose-Capillary: 221 mg/dL — ABNORMAL HIGH (ref 70–99)
Glucose-Capillary: 185 mg/dL — ABNORMAL HIGH (ref 70–99)
Glucose-Capillary: 174 mg/dL — ABNORMAL HIGH (ref 70–99)

## 2024-02-10 LAB — ECHOCARDIOGRAM COMPLETE
Height: 64 in
S' Lateral: 4.6 cm
Single Plane A2C EF: 47.4 %
Weight: 2784 [oz_av]

## 2024-02-10 MED ORDER — FUROSEMIDE 10 MG/ML IJ SOLN
40.0000 mg | Freq: Once | INTRAMUSCULAR | Status: AC
  Administered 2024-02-10: 40 mg via INTRAVENOUS
  Filled 2024-02-10: qty 4

## 2024-02-10 MED ORDER — FUROSEMIDE 10 MG/ML IJ SOLN
40.0000 mg | Freq: Two times a day (BID) | INTRAMUSCULAR | Status: DC
  Administered 2024-02-10 – 2024-02-14 (×8): 40 mg via INTRAVENOUS
  Filled 2024-02-10 (×8): qty 4

## 2024-02-10 NOTE — Telephone Encounter (Signed)
 Ok for E2C2 to review.   3rd attempt to reach patient. If she returns call at this point please schedule her for an appointment.

## 2024-02-10 NOTE — Progress Notes (Signed)
 Speech Language Pathology Treatment: Dysphagia  Patient Details Name: Sarah White MRN: [id number] DOB: [birth year] Today's Date: 02/10/2024 Time: 1100-1130 SLP Time Calculation (min) (ACUTE ONLY): 30 min  Assessment / Plan / Recommendation Clinical Impression  Pt seen for follow up dysphagia intervention. Pt now on 8L HFNC with most recent chest imaging reporting, Increasing vascular congestion. Persistent right-sided pleural effusion and new small left PE adjacent lung opacities. Recommend follow-up. WBC 10.1- pt is afebrile. Pt continues to report SIGNIFICANT fatigue, minimal endurance and appetite for PO intake. RN reporting pt able to complete most of breakfast and medication administration with thin liquid completed without cough.   Today pt seen with trials of thin liquids. No overt or subtle s/sx pharyngeal dysphagia noted. No change to vocal quality across trials. Congested cough remains at baseline- not directly timed with swallow completion. Oral phase grossly intact for completion of trials. Pt deferred solids.   Education reinforced for impact of current deconditioning and low endurance on PO intake and need for rest/slow intake. Pt reported understanding.   Given continued evolution of respiratory/pulmonary function and acute deconditioning with baseline pulm concerns (lung cancer and COPD), pt remains at significant risk for aspiration. However, pt demonstrates compliance with aspiration precautions and minimal clinical s/sx of aspiration during session/reported from nursing. Given clinical tolerance of diet, recommend continuation with stated consistencies and aspiration precautions. SLP to follow peripherally to ensure continued safety with diet/recommendations. MD aware of plan.    HPI HPI: Per H&P Sarah White is a 78 y.o. female with medical history significant of Non-small cell lung cancer s/p SBRT, COPD, HFrEF with last EF 40%, mitral regurgitation, hypertension, uterine  carcinoma s/p hysterectomy, diet-controlled type 2 diabetes, who presents to the ED due to shortness of breath.     Sarah White states that approximately 1 week ago, she was walking in her home when she had a ground-level fall where she landed on her right side.  Her son was able to help her up, however the next day she fell again landing on the same side.  Since then, she has been experiencing severe right rib pain and right flank pain.  1 day after the second fall, she began to experience a productive cough with shortness of breath.  She endorses fever, poor appetite but denies any nausea, vomiting.  She denies any diarrhea. CT Chest: 1. New irregular consolidation and patchy ground-glass opacities  within the dependent right upper lobe, middle lobe, and right lower  lobe with air bronchograms, likely multifocal pneumonia. Possible  underlying posttreatment change in the setting of radiation  treatment.  2. Interval decreased size of the spiculated medial right upper lobe  nodule, consistent with treatment response.  3. New subcarinal lymphadenopathy, indeterminate.  4. Small right pleural effusion.  5. Partially imaged splenomegaly measures 13.4 cm in cranial caudal  dimension, previously 15.7 cm.  6. Aortic Atherosclerosis (ICD10-I70.0). Coronary artery  calcifications. Assessment for potential risk factor modification,  dietary therapy or pharmacologic therapy may be warranted, if  clinically indicated.      SLP Plan  Continue with current plan of care          Recommendations  Diet recommendations: Dysphagia 3 (mechanical soft);Thin liquid Liquids provided via: Cup;Straw Medication Administration: Whole meds with puree Supervision: Patient able to self feed;Intermittent supervision to cue for compensatory strategies Compensations: Minimize environmental distractions;Slow rate;Small sips/bites Postural Changes and/or Swallow Maneuvers: Seated upright 90 degrees;Upright 30-60 min after meal  Oral care BID;Staff/trained caregiver to provide oral care   Intermittent Supervision/Assistance Dysphagia, unspecified (R13.10)     Continue with current plan of care    [place] Tareq Dwan Clapp, MS, CCC-SLP Speech Language Pathologist Rehab Services; [facility] [phone] (ascom)   [place] J Clapp  02/10/2024, 12:09 PM

## 2024-02-10 NOTE — Progress Notes (Signed)
*  PRELIMINARY RESULTS* Echocardiogram 2D Echocardiogram has been performed.  Sarah White 02/10/2024, 10:46 AM

## 2024-02-10 NOTE — Plan of Care (Signed)
  Problem: Education: Goal: Knowledge of disease or condition will improve Outcome: Progressing   Problem: Clinical Measurements: Goal: Ability to maintain clinical measurements within normal limits will improve Outcome: Progressing   Problem: Coping: Goal: Level of anxiety will decrease Outcome: Progressing

## 2024-02-10 NOTE — Progress Notes (Signed)
   02/10/24 1143  What Happened  Was fall witnessed? Yes  Who witnessed fall?  ([place] J Clap (SLP), [facility](NT), Kataleah Bejar(RN))  Patients activity before fall  (sitting in the recliner chair)  Point of contact other (comment) (Assisted fall. Sat on the floor safely)  Was patient injured? No  Provider Notification  Provider Name/Title Darus Engels, DO  Date Provider Notified 02/10/24  Time Provider Notified 1157  Method of Notification Page (secure chat)  Notification Reason Fall (assisted fall)  Provider response No new orders  Date of Provider Response 02/10/24  Time of Provider Response 1159  Follow Up  Family notified Yes - comment (son)  Time family notified 1210  Additional tests No  Simple treatment Dressing (skin tear on Left arm, photo taken)  Progress note created (see row info) Yes  Adult Fall Risk Assessment  Risk Factor Category (scoring not indicated) History of more than one fall within 6 months before admission (document High fall risk)  Patient Fall Risk Level High fall risk  Adult Fall Risk Interventions  Required Bundle Interventions *See Row Information* High fall risk - low, moderate, and high requirements implemented  Additional Interventions Use of appropriate toileting equipment (bedpan, BSC, etc.)  Screening for Fall Injury Risk (To be completed on HIGH fall risk patients) - Assessing Need for Floor Mats  Risk For Fall Injury- Criteria for Floor Mats Admitted as a result of a fall  Will Implement Floor Mats Yes  Vitals  Temp 99 F (37.2 C)  Temp Source Oral  BP (!) 123/54  MAP (mmHg) 73  BP Location Right Arm  BP Method Automatic  Patient Position (if appropriate) Lying  Pulse Rate 66  Pulse Rate Source Monitor  Resp 17  Oxygen Therapy  SpO2 95 %  O2 Device HFNC  O2 Flow Rate (L/min) 5 L/min  Pain Assessment  Pain Scale 0-10  Pain Score 0  PCA/Epidural/Spinal Assessment  Respiratory Pattern Regular;Unlabored  Neurological   Neuro (WDL) X  Level of Consciousness Alert  Orientation Level Oriented X4  Cognition Follows commands  Speech Clear  R Pupil Size (mm) 3  R Pupil Shape Round  R Pupil Reaction Brisk  L Pupil Size (mm) 3  L Pupil Shape Round  L Pupil Reaction Brisk  Motor Function/Sensation Assessment Grip  R Hand Grip Moderate  L Hand Grip Moderate  R Foot Dorsiflexion Moderate  L Foot Dorsiflexion Moderate  R Foot Plantar Flexion Moderate  L Foot Plantar Flexion Moderate  Neuro Symptoms Forgetful  Neuro Additional Assessments Glasgow Coma Scale  Glasgow Coma Scale  Eye Opening 4  Best Verbal Response (NON-intubated) 5  Best Motor Response 6  Glasgow Coma Scale Score 15  Musculoskeletal  Musculoskeletal (WDL) X  Assistive Device MaxiMove  Generalized Weakness Yes  Weight Bearing Restrictions Per Provider Order No  Integumentary  Integumentary (WDL) X  Skin Color Appropriate for ethnicity  Skin Integrity Abrasion  Abrasion Location Arm  Abrasion Location Orientation Left  Abrasion Intervention Foam

## 2024-02-10 NOTE — Plan of Care (Signed)
  Problem: Education: Goal: Knowledge of disease or condition will improve Outcome: Progressing Goal: Knowledge of the prescribed therapeutic regimen will improve Outcome: Progressing Goal: Individualized Educational Video(s) Outcome: Progressing   Problem: Activity: Goal: Ability to tolerate increased activity will improve Outcome: Progressing Goal: Will verbalize the importance of balancing activity with adequate rest periods Outcome: Progressing   Problem: Respiratory: Goal: Ability to maintain a clear airway will improve Outcome: Progressing Goal: Levels of oxygenation will improve Outcome: Progressing Goal: Ability to maintain adequate ventilation will improve Outcome: Progressing   Problem: Activity: Goal: Ability to tolerate increased activity will improve Outcome: Progressing   Problem: Clinical Measurements: Goal: Ability to maintain a body temperature in the normal range will improve Outcome: Progressing   Problem: Respiratory: Goal: Ability to maintain adequate ventilation will improve Outcome: Progressing Goal: Ability to maintain a clear airway will improve Outcome: Progressing   Problem: Education: Goal: Knowledge of General Education information will improve Description: Including pain rating scale, medication(s)/side effects and non-pharmacologic comfort measures Outcome: Progressing   Problem: Health Behavior/Discharge Planning: Goal: Ability to manage health-related needs will improve Outcome: Progressing   Problem: Clinical Measurements: Goal: Ability to maintain clinical measurements within normal limits will improve Outcome: Progressing Goal: Will remain free from infection Outcome: Progressing Goal: Diagnostic test results will improve Outcome: Progressing Goal: Respiratory complications will improve Outcome: Progressing Goal: Cardiovascular complication will be avoided Outcome: Progressing   Problem: Activity: Goal: Risk for activity  intolerance will decrease Outcome: Progressing   Problem: Nutrition: Goal: Adequate nutrition will be maintained Outcome: Progressing   Problem: Coping: Goal: Level of anxiety will decrease Outcome: Progressing   Problem: Elimination: Goal: Will not experience complications related to bowel motility Outcome: Progressing Goal: Will not experience complications related to urinary retention Outcome: Progressing   Problem: Pain Managment: Goal: General experience of comfort will improve and/or be controlled Outcome: Progressing   Problem: Safety: Goal: Ability to remain free from injury will improve Outcome: Progressing   Problem: Skin Integrity: Goal: Risk for impaired skin integrity will decrease Outcome: Progressing

## 2024-02-10 NOTE — Progress Notes (Signed)
 Subjective  Patient continues with shortness of breath on exertion.  CXR 06/11 with increased vascular congestion.  Echocardiogram 06/12 with EF 35-40%, moderately decreased function. Unable to assess RWMA. RV function is preserved.  S/p IV lasix 40mg  x1 yesterday with good response.    Physical exam GEN no acute distress HEENT supple neck, reactive pupils, EOMI Lungs decreased air movement with prolonged expiratory phase CVS normal S1, normal S2, regular rate and rhythm Abdomen soft nontender nondistended Extremities warm well-perfused no edema  Labs and imaging were reviewed  Assessment and plan Sarah White is a 78 year old female patient with a past medical history of tobacco use, suspecting COPD on Bevespi follows with Dr. Viva Grise, nonischemic cardiomyopathy with EF of 30% in 2021 who presented to [facility] on 06/06 with worsening shortness of breath.  She was found to have right-sided consolidation secondary to strep pneumonia complicated by strep pneumonia bacteremia  # Acute hypoxic respiratory failure secondary to # Strep pneumo pneumonia with bacteremia # COPD on Bevespi # Heart failure with reduced EF 30% in 2021 #Now with pulmonary edema   Appears that she has been improving over the course of the 4 days however still with some shortness of breath specifically on exertion.  CXR with pulmonary edema. Started on lasix yesterday with good response.   Plan []  Start Brovana twice daily and Yupelri daily. []  continue weaning of steroids would recommend switching to prednisone  30 mg daily and wean down by 10 mg every 3 days. []  Antibiotic management per primary team. []  Agree with diuresis per primary team.  []  Recommend incentive spirometer, out of bed to chair.  Pulmonary will follow with you.  I spent 50 minutes caring for this patient today, including preparing to see the patient, obtaining a medical history , reviewing a separately obtained history, performing a medically  appropriate examination and/or evaluation, counseling and educating the patient/family/caregiver, documenting clinical information in the electronic health record, and independently interpreting results (not separately reported/billed) and communicating results to the patient/family/caregiver  Annitta Kindler, MD [facility] 02/10/2024 1:37 PM

## 2024-02-10 NOTE — Progress Notes (Signed)
 Progress Note   Patient: Sarah White XBM:[id number] DOB: [birth year] DOA: 02/04/2024     5 DOS: the patient was seen and examined on 02/10/2024   Brief hospital course: HPI on admission: 78 y.o. female with medical history significant of Non-small cell lung cancer s/p SBRT, COPD, HFrEF with last EF 40%, mitral regurgitation, hypertension, uterine carcinoma s/p hysterectomy, diet-controlled type 2 diabetes, who presents to the ED due to shortness of breath.   Sarah White states that approximately 1 week ago, she was walking in her home when she had a ground-level fall where she landed on her right side.  Her Sarah White was able to help her up, however the next day she fell again landing on the same side.  Since then, she has been experiencing severe right rib pain and right flank pain.  1 day after the second fall, she began to experience a productive cough with shortness of breath.  She endorses fever, poor appetite but denies any nausea, vomiting.  She denies any diarrhea.   ED course: On arrival to the ED, patient was normotensive at 139/83 with heart rate of 82.  She was saturating at 93% on 3 L.  She was afebrile at 98.5.  Initial workup notable for sodium 132, bicarb 23, BUN 27, creat 1.03, total bilirubin 2.9 and GFR 57. CBC still pending. COVID-19, influenza and RSV PCR negative. Chest xray with dense consolidation of the right lower lobe. Patient started on Azithromycin  and Rocephin , as well as Solumedrol and Duonebs. TRH contacted for admission.   Patient was admitted on 02/04/2024 for further evaluation and management of acute respiratory failure with hypoxia.  Subsequently found to have Streptococcus pneumonia and bacteremia as outlined in detail below.  I assumed care 02/09/2024. Further hospital course and management as outlined below.   Assessment and Plan:  * Bacteremia and Multifocal pneumonia due to Streptococcus pneumoniae Streptococcus pneumoniae sensitive to Rocephin .   --Continue  Rocephin  --Follow repeat blood cultures - neg to date --Pulmonology following --Transition IV Solu-medrol >> Prednisone  30 mg tomorrow, taper by 10 mg every 3 days  --Incentive spirometer --OOB to chair, mobilize --Repeat CXR this AM -- some signs of fluid overload noted --Lasix 40 mg IV x 1 today --Reassess daily for further diuresis --Pulmicort nebs BID --Per Pulmonology, added Brovana neb BID, Yupelri neb daily --Mucinex  BID, PRN Tussionex    COPD with acute exacerbation  --Mgmt as above with steroids and nebs   Acute respiratory failure with hypoxia - due to PNA, COPD, CHF decompensation, pleural effusion, hx lung cancer.   No O2 requirement at baseline.  ER physician documented pulse ox of 75% on room air.   6/12 -- Increased O2 needs from 5 >> 8 L/min HFNC.   --Mgmt of PNA, CHF and COPD as outlined --Supplement O2 to maintain spO2 > 88%, wean as tolerated --Goal for discharge, hopefully < 4 L/min --Home O2 qualification in next day or two --Incentive spirometer  Acute on Chronic HFrEF  ECHO -- EF 35-40% 6/11 - increasing O2 needs.  CXR with signs of volume overload.  Lasix started with good response. --Continue IV Lasix 40 mg BID --Strict Io's and daily weights --Monitor renal function and electrolytes   Ground-level fall Sarah White recommended rehab   Non-small cell lung cancer (HCC) History of non-small cell lung cancer diagnosed earlier this year with screening CT.  She is not a candidate for bronchoscopy for tissue sampling or surgical intervention.  She has completed SBRT with her last session on  April 30.   Elevated bilirubin Elevated total bilirubin on admission - resolved.   Thrush Continue Diflucan.   Constipation Bowel regimen per orders   Hypokalemia Replaced   Thrombocytopenia (HCC) Today's platelet count 129.   CKD (chronic kidney disease), stage III (HCC) Per chart review, patient has a history of CKD stage IIIa with baseline creatinine ranging  between 1.1 and 1.3, currently 1.0.  Last creatinine 0.64.   Type 2 diabetes mellitus (HCC) History of diet-controlled type 2 diabetes with last A1c of 6.5% No indication for SSI Monitor CBGs given steroid use   Hypertension Continue Coreg       Subjective: Sarah White awake sitting up in bed this AM.  She reports ongoing persistent cough, but not able to get phlegm to come up.  Chest aches from coughing.  No fever/chills.  Made a lot of urine with Lasix given.  Reports back aching.  No other acute complaints.   Physical Exam: Vitals:   02/10/24 0722 02/10/24 0815 02/10/24 0941 02/10/24 1143  BP:  (!) 120/47 138/60 (!) 123/54  Pulse:  72 76 66  Resp:  16 17 17   Temp:  98.4 F (36.9 C) 98.5 F (36.9 C) 99 F (37.2 C)  TempSrc:  Oral Oral Oral  SpO2: 90% 90% 93% 95%  Weight:      Height:       General exam: awake, alert, no acute distress HEENT: edentulous, moist mucus membranes, hearing grossly normal  Respiratory system: diffuse rhonchi vs referred upper airway sounds, wet sounding cough, on 8 L/min HFNC O2, no accessory muscle use at rest Cardiovascular system: normal S1/S2, RRR,  no pedal edema.   Gastrointestinal system: soft, NT, ND Central nervous system: no gross focal neurologic deficits, normal speech, A&Ox3 Skin: dry, intact, normal temperature Psychiatry: normal mood and affect  Data Reviewed:  No new labs today  Family Communication: Sarah White, Sarah White, updated by phone this afternoon 6/12  Disposition: Status is: Inpatient Remains inpatient appropriate because: ongoing high oxygen needs, requiring IV meds including Lasix, antibiotics.   Planned Discharge Destination: Skilled nursing facility recommended    Time spent: 45 minutes  Author: Montey Apa, DO 02/10/2024 2:15 PM  For on call review [email or web address].

## 2024-02-11 DIAGNOSIS — Z515 Encounter for palliative care: Secondary | ICD-10-CM

## 2024-02-11 DIAGNOSIS — J441 Chronic obstructive pulmonary disease with (acute) exacerbation: Secondary | ICD-10-CM | POA: Diagnosis not present

## 2024-02-11 DIAGNOSIS — J9601 Acute respiratory failure with hypoxia: Secondary | ICD-10-CM | POA: Diagnosis not present

## 2024-02-11 DIAGNOSIS — R7881 Bacteremia: Secondary | ICD-10-CM | POA: Diagnosis not present

## 2024-02-11 DIAGNOSIS — B953 Streptococcus pneumoniae as the cause of diseases classified elsewhere: Secondary | ICD-10-CM | POA: Diagnosis not present

## 2024-02-11 LAB — CBC
HCT: 35.9 % — ABNORMAL LOW (ref 36.0–46.0)
Hemoglobin: 10.9 g/dL — ABNORMAL LOW (ref 12.0–15.0)
MCH: 26 pg (ref 26.0–34.0)
MCHC: 30.4 g/dL (ref 30.0–36.0)
MCV: 85.7 fL (ref 80.0–100.0)
Platelets: 129 10*3/uL — ABNORMAL LOW (ref 150–400)
RBC: 4.19 MIL/uL (ref 3.87–5.11)
RDW: 16.3 % — ABNORMAL HIGH (ref 11.5–15.5)
WBC: 7.5 10*3/uL (ref 4.0–10.5)
nRBC: 0 % (ref 0.0–0.2)

## 2024-02-11 LAB — BASIC METABOLIC PANEL WITH GFR
Anion gap: 13 (ref 5–15)
BUN: 18 mg/dL (ref 8–23)
CO2: 32 mmol/L (ref 22–32)
Calcium: 8.3 mg/dL — ABNORMAL LOW (ref 8.9–10.3)
Chloride: 93 mmol/L — ABNORMAL LOW (ref 98–111)
Creatinine, Ser: 0.72 mg/dL (ref 0.44–1.00)
GFR, Estimated: >60 mL/min (ref >60)
Glucose, Bld: 95 mg/dL (ref 70–99)
Potassium: 3.4 mmol/L — ABNORMAL LOW (ref 3.5–5.1)
Sodium: 138 mmol/L (ref 135–145)

## 2024-02-11 LAB — GLUCOSE, CAPILLARY
Glucose-Capillary: 95 mg/dL (ref 70–99)
Glucose-Capillary: 201 mg/dL — ABNORMAL HIGH (ref 70–99)
Glucose-Capillary: 265 mg/dL — ABNORMAL HIGH (ref 70–99)
Glucose-Capillary: 263 mg/dL — ABNORMAL HIGH (ref 70–99)

## 2024-02-11 LAB — MAGNESIUM: Magnesium: 2.1 mg/dL (ref 1.7–2.4)

## 2024-02-11 MED ORDER — POTASSIUM CHLORIDE CRYS ER 20 MEQ PO TBCR
40.0000 meq | EXTENDED_RELEASE_TABLET | Freq: Once | ORAL | Status: AC
  Administered 2024-02-11: 40 meq via ORAL
  Filled 2024-02-11: qty 2

## 2024-02-11 MED ORDER — PREDNISONE 10 MG PO TABS
10.0000 mg | ORAL_TABLET | Freq: Every day | ORAL | Status: DC
  Administered 2024-02-15: 10 mg via ORAL
  Filled 2024-02-11: qty 1

## 2024-02-11 MED ORDER — PREDNISONE 20 MG PO TABS
20.0000 mg | ORAL_TABLET | Freq: Every day | ORAL | Status: AC
  Administered 2024-02-12 – 2024-02-14 (×3): 20 mg via ORAL
  Filled 2024-02-11 (×3): qty 1

## 2024-02-11 NOTE — Consult Note (Signed)
 Consultation Note Date: 02/11/2024   Patient Name: Sarah White  DOB: 01/22/1946  MRN: [id number]  Age / Sex: 78 y.o., female  PCP: Sarah Dupre, DO Referring Physician: Montey Apa, DO  Reason for Consultation: Establishing goals of care   HPI/Brief Hospital Course: 78 y.o. female  with past medical history of non-small cell lung cancer status post SBRT, COPD, HFrEF with EF 40%, mitral regurgitation, uterine carcinoma status post hysterectomy, hypertension, type 2 diabetes admitted from home on 02/04/2024 with worsening shortness of breath.  Reportedly experienced 2 falls at home about a week ago and had complaints of right sided pain.  Soon after fall began developing shortness of breath and productive cough.  Admitted and being treated for multi focal pneumonia due to strep pneumoniae with associated bacteremia  Palliative medicine was consulted for assisting with goals of care conversations.  Subjective:  Extensive chart review has been completed prior to meeting patient including labs, vital signs, imaging, progress notes, orders, and available advanced directive documents from current and previous encounters.  Visited earlier in the day with Sarah White at her bedside.  She is sitting up in recliner, alert, and able to engage in conversations.  Son Sarah White at bedside engaged with assisting Sarah White with finances due to prolonged hospitalization.  Excused myself and made a plan to return later in the day.  Return to bedside later in the afternoon.  Sarah White remains in recliner, she remains alert and awake and able to engage in conversation.  No family or visitors at bedside on return visit.  Introduced myself as a Publishing rights manager as a member of the palliative care team. Explained palliative medicine is specialized medical care for people living with serious illness. It focuses on providing relief from the symptoms and stress of a serious  illness. The goal is to improve quality of life for both the patient and the family.   Sarah White shares a brief life review.  She is divorced and Sarah White is her only living adult child.  Sarah White has been living with her for about 13 years and has been her caretaker as her health has declined.  She worked for many years at [employer] prior to retiring.  Sarah White shares prior to admission she was fairly independent with ADLs and was able to ambulate around her home without assistance but would occasionally utilize her walker for longer distances.  She shares she lives a fairly sedentary lifestyle and enjoys reading and watching the news.  Sarah White shares her understanding of current medical situation.  Aware she is being treated for pneumonia as well as bacteremia.  We discussed her case being complicated by her underlying COPD as well as cardiomyopathy.  We discussed patient's current illness and what it means in the larger context of patient's on-going co-morbidities. Natural disease trajectory and expectations at EOL were discussed.   Sarah White confirms DNR/DNI status.  She shares she wishes to continue with current plan of care and is hopeful to recover back to her place where she can return home with her son and function somewhat independently.  Called and spoke with son Sarah White.  Sarah White also able to explain his understanding of current medical situation.  Sarah White shares over the last several years being aware of an ongoing functional decline.  Sarah White is hopeful Sarah White will be eligible for short-term rehab to work on regaining her strength and mobility prior to returning time.  We discussed the role of outpatient palliative care for  which Sarah White is accepting of.  Referral placed to TOC.  I discussed importance of continued conversations with family/support persons and all members of their medical team regarding overall plan of care and treatment options ensuring decisions are in alignment with patients goals of  care.  All questions/concerns addressed. Emotional support provided to patient/family/support persons. PMT will continue to follow and support patient as needed.  Objective: Primary Diagnoses: Present on Admission:  Non-small cell lung cancer (HCC)  COPD with acute exacerbation (HCC)  Acute hypoxic respiratory failure (HCC)  HFrEF (heart failure with reduced ejection fraction) (HCC)  Hypertension  CKD (chronic kidney disease), stage III (HCC)  Thrombocytopenia (HCC)  Bacteremia due to Streptococcus pneumoniae   Physical Exam Constitutional:      General: She is not in acute distress.    Appearance: She is ill-appearing.  Pulmonary:     Effort: Pulmonary effort is normal. No respiratory distress.     Comments: Productive cough  Neurological:     Mental Status: She is alert and oriented to person, place, and time.     Motor: Weakness present.   Psychiatric:        Mood and Affect: Mood normal.        Thought Content: Thought content normal.     Vital Signs: BP (!) 128/52 (BP Location: Right Arm)   Pulse 63   Temp 97.9 F (36.6 C) (Oral)   Resp 16   Ht 5' 4 (1.626 m)   Wt 78.9 kg   SpO2 95%   BMI 29.87 kg/m  Pain Scale: 0-10 POSS *See Group Information*: 1-Acceptable,Awake and alert Pain Score: 3   IO: Intake/output summary:  Intake/Output Summary (Last 24 hours) at 02/11/2024 1802 Last data filed at 02/10/2024 2300 Gross per 24 hour  Intake --  Output 600 ml  Net -600 ml    LBM: Last BM Date : 02/08/24 Baseline Weight: Weight: 78.9 kg Most recent weight: Weight: 78.9 kg      Assessment and Plan  SUMMARY OF RECOMMENDATIONS   Continue current plan of care Referral placed for outpatient palliative care services   Palliative Prophylaxis:   Bowel Regimen, Delirium Protocol and Frequent Pain Assessment   Thank you for this consult and allowing Palliative Medicine to participate in the care of Sarah White. Palliative medicine will continue to follow and  assist as needed.   Time Total: 75 minutes  Time spent includes: Detailed review of medical records (labs, imaging, vital signs), medically appropriate exam (mental status, respiratory, cardiac, skin), discussed with treatment team, counseling and educating patient, family and staff, documenting clinical information, medication management and coordination of care.   Signed by: Isadore Marble, DNP, AGNP-C Palliative Medicine    Please contact Palliative Medicine Team phone at [phone] for questions and concerns.  For individual provider: See Tilford Foley

## 2024-02-11 NOTE — Progress Notes (Signed)
 Physical Therapy Treatment Patient Details Name: Sarah White MRN: [id number] DOB: 1946/04/28 Today's Date: 02/11/2024   History of Present Illness Sarah White is a 78 y.o. female with medical history significant of Non-small cell lung cancer s/p SBRT, COPD, HFrEF with last EF 40%, mitral regurgitation, hypertension, uterine carcinoma s/p hysterectomy, diet-controlled type 2 diabetes, who presents to the ED due to shortness of breath.    PT Comments  Pt tolerated session well. She was instructed in LE strengthening exercise seated in chair. Patient responded well to verbal/visual cues for increased ROM. She does fatigue quickly. Vitals monitored throughout and SPo2 >93% despite patient appearing short of breath. She was re-educated in incentive spirometry having difficulty increasing volume with slow breath. Patient also worked on sitting forward in chair with less back support, demonstrating decreased trunk control with increased posterior and lateral lean requiring rail assist and intermittent minimal physical assistance for upright positioning. PT tried to assist patient in sit to stand transfer from chair, but patient unable to push through legs with enough force to clear hips from chair. She required mod A +2 for stedy lift transfer from chair to bed. Patient would benefit from additional skilled PT Intervention to improve strength and mobility.     If plan is discharge home, recommend the following: A lot of help with walking and/or transfers;A lot of help with bathing/dressing/bathroom;Help with stairs or ramp for entrance;Assist for transportation   Can travel by private vehicle     No  Equipment Recommendations  Other (comment)    Recommendations for Other Services       Precautions / Restrictions Precautions Precautions: Fall Recall of Precautions/Restrictions: Impaired Restrictions Weight Bearing Restrictions Per Provider Order: No     Mobility  Bed Mobility Overal bed  mobility: Needs Assistance         Sit to supine: HOB elevated, Used rails, Mod assist   General bed mobility comments: Required cues for hand position and technique;    Transfers Overall transfer level: Needs assistance   Transfers: Sit to/from Stand, Bed to chair/wheelchair/BSC Sit to Stand: Mod assist, +2 physical assistance           General transfer comment: PT attempted sit to stand from bedside chair with therapist assist, pt unable to push through legs to clear hips off chair; She required mod A +2 for sit<>Stand and stand pivot using stedy lift x1 rep; Transfer via Lift Equipment: Stedy  Ambulation/Gait               General Gait Details: unable at this time due to fatigue and weakness   Stairs             Wheelchair Mobility     Tilt Bed    Modified Rankin (Stroke Patients Only)       Balance Overall balance assessment: Needs assistance Sitting-balance support: Feet supported, Bilateral upper extremity supported Sitting balance-Leahy Scale: Fair Sitting balance - Comments: Pt requires BUE support when sitting without back support, demonstrates posterior and lateral lean with poor trunk control; Postural control: Posterior lean Standing balance support: During functional activity, Bilateral upper extremity supported, Reliant on assistive device for balance Standing balance-Leahy Scale: Zero Standing balance comment: in stedy                            Communication Communication Communication: No apparent difficulties Factors Affecting Communication: Hearing impaired  Cognition Arousal: Alert Behavior During Therapy: Flat affect  Following commands: Impaired Following commands impaired: Follows one step commands with increased time (with multi modal cues)    Cueing Cueing Techniques: Verbal cues  Exercises General Exercises - Lower Extremity Ankle Circles/Pumps: AROM, Both, 10  reps Long Arc Quad: AROM, Both, 10 reps Straight Leg Raises: AROM, Both, 10 reps Hip Flexion/Marching: AROM, Both, 10 reps Other Exercises Other Exercises: Instructed patient in LE strengthening exercise; required min verbal and visual cues to increase ROM and slow down LE movement for better strengthening. Fatigues quickly; Other Exercises: Pt appears short of breath during exercise, trying to cough but unable to clear mucus. She reports using spirometer throughout the day. PT reeducated on importance of regular use; She was able to inhale with device increasing to 250-500 during 2-3 sec inspiration. Has difficulty increasing force as well as maintaining prolonged inspiration.    General Comments        Pertinent Vitals/Pain      Home Living                          Prior Function            PT Goals (current goals can now be found in the care plan section) Acute Rehab PT Goals Patient Stated Goal: to return to PLOF PT Goal Formulation: With patient Time For Goal Achievement: 02/19/24 Potential to Achieve Goals: Fair Progress towards PT goals: Progressing toward goals    Frequency    Min 2X/week      PT Plan      Co-evaluation              AM-PAC PT 6 Clicks Mobility   Outcome Measure  Help needed turning from your back to your side while in a flat bed without using bedrails?: A Lot Help needed moving from lying on your back to sitting on the side of a flat bed without using bedrails?: A Lot Help needed moving to and from a bed to a chair (including a wheelchair)?: A Lot Help needed standing up from a chair using your arms (e.g., wheelchair or bedside chair)?: A Lot Help needed to walk in hospital room?: A Lot Help needed climbing 3-5 steps with a railing? : Total 6 Click Score: 11    End of Session Equipment Utilized During Treatment: Gait belt Activity Tolerance: Patient tolerated treatment well;Patient limited by fatigue Patient left: in  bed;with call bell/phone within reach;with bed alarm set Nurse Communication: Mobility status PT Visit Diagnosis: Unsteadiness on feet (R26.81);Other abnormalities of gait and mobility (R26.89);Muscle weakness (generalized) (M62.81);History of falling (Z91.81);Difficulty in walking, not elsewhere classified (R26.2);Pain Pain - Right/Left: Left Pain - part of body: Leg     Time: 1541-1606 PT Time Calculation (min) (ACUTE ONLY): 25 min  Charges:    $Therapeutic Exercise: 8-22 mins $Therapeutic Activity: 8-22 mins PT General Charges $$ ACUTE PT VISIT: 1 Visit                       Asli Tokarski PT, DPT 02/11/2024, 4:25 PM

## 2024-02-11 NOTE — Plan of Care (Signed)

## 2024-02-11 NOTE — Progress Notes (Signed)
 Occupational Therapy Treatment Patient Details Name: Sarah White MRN: [id number] DOB: Mar 26, 1946 Today's Date: 02/11/2024   History of present illness Sarah White is a 78 y.o. female with medical history significant of Non-small cell lung cancer s/p SBRT, COPD, HFrEF with last EF 40%, mitral regurgitation, hypertension, uterine carcinoma s/p hysterectomy, diet-controlled type 2 diabetes, who presents to the ED due to shortness of breath.   OT comments  Chart reviewed to date, pt greeted in bed, alert and oriented x4, improved cognition/direction following as compared to previous tx sessions. Pt is agreeable to tx session targeting improving functional activity tolerance in prep for ADLs. Improvements noted today as pt requires MOD A for bed mobility, sits on edge of bed for approx 5 minutes with supervision-CGA. STS completed in STEDY 2 attempts with MIN-MOD A, transfer to bedside chair via stedy with +2. MAX A required for LB dressing on this date. Pt is left in chair, all needs met. Pt is making progress towards goals however continues to perform ADL/functional mobility below PLOF. Discharge recommendation remains appropriate.    Of note: spo2 to 85% on 5 L via HFNC (bumped up for mobility-notified nurse) after transfer, recovers to 90% after approx 1 minute; pt left on 4L via HFNC in chair at end of session at 90%; HR 70s bpm after mobility; pt does not report SOB throughout      If plan is discharge home, recommend the following:  A little help with walking and/or transfers;A little help with bathing/dressing/bathroom;Assistance with cooking/housework;Direct supervision/assist for medications management;Direct supervision/assist for financial management;Assist for transportation   Equipment Recommendations  Other (comment) (defer to next venue of care)    Recommendations for Other Services      Precautions / Restrictions Precautions Precautions: Fall Recall of Precautions/Restrictions:  Impaired Restrictions Weight Bearing Restrictions Per Provider Order: No       Mobility Bed Mobility Overal bed mobility: Needs Assistance Bed Mobility: Rolling, Supine to Sit Rolling: Contact guard assist, Min assist, Used rails   Supine to sit: Mod assist, HOB elevated, Used rails     General bed mobility comments: frequent mutli modal cues for technique    Transfers Overall transfer level: Needs assistance Equipment used: Ambulation equipment used Transfers: Sit to/from Stand, Bed to chair/wheelchair/BSC Sit to Stand: Min assist, Mod assist (using bars on stedy to pull herself up)             Transfer via Lift Equipment: Stedy   Balance Overall balance assessment: Needs assistance Sitting-balance support: Feet supported, Bilateral upper extremity supported Sitting balance-Leahy Scale: Fair Sitting balance - Comments: supervision-CGA for approx 5 minutse   Standing balance support: During functional activity, Bilateral upper extremity supported, Reliant on assistive device for balance Standing balance-Leahy Scale: Zero Standing balance comment: in stedy                           ADL either performed or assessed with clinical judgement   ADL Overall ADL's : Needs assistance/impaired                     Lower Body Dressing: Maximal assistance;Bed level     Toilet Transfer Details (indicate cue type and reason): transfer via Stedy to bedside chair; simulated                Extremity/Trunk Assessment              Vision  Perception     Praxis     Communication Communication Communication: No apparent difficulties Factors Affecting Communication: Hearing impaired   Cognition Arousal: Alert Behavior During Therapy: Flat affect Cognition: Cognition impaired         Attention impairment (select first level of impairment): Selective attention Executive functioning impairment (select all impairments): Problem  solving OT - Cognition Comments: improved compared to previous tx session                 Following commands: Impaired Following commands impaired: Follows one step commands with increased time (with multi modal cues)      Cueing   Cueing Techniques: Verbal cues  Exercises Other Exercises Other Exercises: edu re: role of OT, role of rehab, importance of progressing mobility    Shoulder Instructions       General Comments spo2 to 85% on 5 L via HFNC (bumped up for mobility-notified nurse) after transfer, recovers to 90% after approx 1 minute; pt left on 4L via HFNC at end of session at 90%; HR 70s bpm after mobility; pt does not report SOB throughout    Pertinent Vitals/ Pain       Pain Assessment Pain Assessment: No/denies pain  Home Living                                          Prior Functioning/Environment              Frequency  Min 2X/week        Progress Toward Goals  OT Goals(current goals can now be found in the care plan section)  Progress towards OT goals: Progressing toward goals  Acute Rehab OT Goals Time For Goal Achievement: 02/19/24  Plan      Co-evaluation                 AM-PAC OT 6 Clicks Daily Activity     Outcome Measure   Help from another person eating meals?: A Little Help from another person taking care of personal grooming?: A Little Help from another person toileting, which includes using toliet, bedpan, or urinal?: Total Help from another person bathing (including washing, rinsing, drying)?: A Lot Help from another person to put on and taking off regular upper body clothing?: A Lot Help from another person to put on and taking off regular lower body clothing?: Total 6 Click Score: 12    End of Session Equipment Utilized During Treatment: Oxygen;Other (comment) (stedy)  OT Visit Diagnosis: Unsteadiness on feet (R26.81);Other abnormalities of gait and mobility (R26.89);Muscle weakness  (generalized) (M62.81);Repeated falls (R29.6)   Activity Tolerance Patient tolerated treatment well   Patient Left in chair;with call bell/phone within reach;with chair alarm set   Nurse Communication Mobility status        Time: 1040-1107 OT Time Calculation (min): 27 min  Charges: OT General Charges $OT Visit: 1 Visit OT Treatments $Self Care/Home Management : 8-22 mins $Therapeutic Activity: 8-22 mins  Gerre Kraft, OTD OTR/L  02/11/24, 11:23 AM

## 2024-02-11 NOTE — Progress Notes (Signed)
 Heart Failure Stewardship Pharmacy Note  PCP: Solomon Dupre, DO PCP-Cardiologist: None  HPI: Sarah White is a 78 y.o. female with non-small cell lung cancer s/p SBRT, COPD, HFrEF, CAD, mitral regurgitation, hypertension, uterine carcinoma s/p hysterectomy, type 2 diabetes who presented with shortness of breath. On admission, lactic acid was 1. Chest x-ray 02/04/24 noted dense right basilar pneumonia. Repeat chest x-ray 02/09/24 showed increasing vascular congestion. Blood cultures positive for strep pneumo.   Pertinent cardiac history: Echo 07/2015 with LVEF 45-50%. LVEF improved to 50-55% in 10/2016. LHC at the same time showed two-vessel coronary artery disease with occluded proximal RCA, an occluded mid left circumflex, with contralateral collaterals to distal RCA. Echo 05/2019 with LVEF back down to 40-45%. Echo 11/2019 noted LVEF down to 30-35% and moderate to severe mitral stenosis.  [facility] 12/2019 with severe multivessel disease, normal left main, mild to moderate disease in the LAD, 100% mid circumflex, 100% percent proximal RCA, and extensive collaterals left to distal right. Echo this admission noted LVEF of 35-40% and did not note mitral stenosis.  Pertinent Lab Values: Creatinine, Ser  Date Value Ref Range Status  02/11/2024 0.72 0.44 - 1.00 mg/dL Final   BUN  Date Value Ref Range Status  02/11/2024 18 8 - 23 mg/dL Final   Potassium  Date Value Ref Range Status  02/11/2024 3.4 (L) 3.5 - 5.1 mmol/L Final   Sodium  Date Value Ref Range Status  02/11/2024 138 135 - 145 mmol/L Final   Magnesium  Date Value Ref Range Status  02/11/2024 2.1 1.7 - 2.4 mg/dL Final    Comment:    Performed at [facility], [address] [place], [place] [address]    Vital Signs:  Temp:  [97.1 F (36.2 C)-99 F (37.2 C)] 97.1 F (36.2 C) (06/13 0524) Pulse Rate:  [60-76] 60 (06/13 0524) Resp:  [16-18] 18 (06/13 0524) BP: (120-158)/(47-69) 158/69 (06/13 0524) SpO2:  [90 %-97 %] 93  % (06/13 0524)  Intake/Output Summary (Last 24 hours) at 02/11/2024 0716 Last data filed at 02/10/2024 2300 Gross per 24 hour  Intake 240 ml  Output 1650 ml  Net -1410 ml   Current Heart Failure Medications:  Loop diuretic: furosemide  40 mg IV BID Beta-Blocker: carvedilol  12.5 mg BID ACEI/ARB/ARNI: none MRA: none SGLT2i: none Other: none  Prior to admission Heart Failure Medications:  Loop diuretic: furosemide  40 mg daily Beta-Blocker: carvedilol  12.5 mg BID ACEI/ARB/ARNI: none MRA: none SGLT2i: none Other: none  Assessment: 1. Acute on chronic systolic heart failure (LVEF 35-40%)  , due to ICM. NYHA class IV symptoms.  -Symptoms: Patient reports severe lethargy, likely related to her bacteremia. Reports shortness of breath has improved with furosemide . Reports appetite is improving. Complains of back pain, though this has persisted for years. Unable to lay flat due to back pain. -Volume: Likely still with some volume on board. Responding well to furosemide  with stable creatinine and BUN, through it appears she is beginning to develop contraction alkalosis. Can consider transition to oral diuretics tomorrow. -Hemodynamics: BP elevated. HR low.  -BB: Currently on carvedilol  12.5 mg BID from home. HR is relatively low, will need to monitor closely. -ACEI/ARB/ARNI: Precvious dizziness with lisinopril. BP elevated, can consider adding losartan during admission.  -MRA: Hypokalemic and hypertensive. Consider adding spironolactone 12.5 mg daily. -SGLT2i: Would avoid adding at this time given bacteremia and risk for euglycemic DKA.   Plan: 1) Medication changes recommended at this time: -Consider adding spironolactone 12.5 mg daily  2) Patient  assistance: -Pending  3) Education: - Patient has been educated on current HF medications and potential additions to HF medication regimen - Patient verbalizes understanding that over the next few months, these medication doses may change and  more medications may be added to optimize HF regimen - Patient has been educated on basic disease state pathophysiology and goals of therapy  Medication Assistance / Insurance Benefits Check: Does the patient have prescription insurance?    Type of insurance plan:  Does the patient qualify for medication assistance through manufacturers or grants? Pending   Outpatient Pharmacy: Prior to admission outpatient pharmacy: [facility]      Please do not hesitate to reach out with questions or concerns,  Bevely Brush, PharmD, CPP, BCPS Heart Failure Pharmacist  Phone - [phone] 02/11/2024 9:43 AM

## 2024-02-11 NOTE — Progress Notes (Signed)
 Progress Note   Patient: Sarah White NWG:[id number] DOB: [birth year] DOA: 02/04/2024     6 DOS: the patient was seen and examined on 02/11/2024   Brief hospital course: HPI on admission: 78 y.o. female with medical history significant of Non-small cell lung cancer s/p SBRT, COPD, HFrEF with last EF 40%, mitral regurgitation, hypertension, uterine carcinoma s/p hysterectomy, diet-controlled type 2 diabetes, who presents to the ED due to shortness of breath.   Mrs. [place] states that approximately 1 week ago, she was walking in her home when she had a ground-level fall where she landed on her right side.  Her son was able to help her up, however the next day she fell again landing on the same side.  Since then, she has been experiencing severe right rib pain and right flank pain.  1 day after the second fall, she began to experience a productive cough with shortness of breath.  She endorses fever, poor appetite but denies any nausea, vomiting.  She denies any diarrhea.   ED course: On arrival to the ED, patient was normotensive at 139/83 with heart rate of 82.  She was saturating at 93% on 3 L.  She was afebrile at 98.5.  Initial workup notable for sodium 132, bicarb 23, BUN 27, creat 1.03, total bilirubin 2.9 and GFR 57. CBC still pending. COVID-19, influenza and RSV PCR negative. Chest xray with dense consolidation of the right lower lobe. Patient started on Azithromycin  and Rocephin , as well as Solumedrol and Duonebs. [facility] contacted for admission.   Patient was admitted on 02/04/2024 for further evaluation and management of acute respiratory failure with hypoxia.  Subsequently found to have Streptococcus pneumonia and bacteremia as outlined in detail below.  I assumed care 02/09/2024. Further hospital course and management as outlined below.   Assessment and Plan:  * Bacteremia and Multifocal pneumonia due to Streptococcus pneumoniae Streptococcus pneumoniae sensitive to Rocephin .   --Continue  Rocephin  --Follow repeat blood cultures - neg to date --Pulmonology following --Transition IV Solu-medrol  >> Prednisone  30 >>20 tomorrow, taper by 10 mg q3 days  --Incentive spirometer --OOB to chair, mobilize --Continue Lasix  40 mg IV BID --Pulmicort  nebs BID --Per Pulmonology, added Brovana  neb BID, Yupelri  neb daily --Mucinex  BID, PRN Tussionex    COPD with acute exacerbation  --Mgmt as above with steroids and nebs   Acute respiratory failure with hypoxia - due to PNA, COPD, CHF decompensation, pleural effusion, hx lung cancer.   No O2 requirement at baseline.  ER physician documented pulse ox of 75% on room air.   6/12 -- Increased O2 needs from 5 >> 8 L/min HFNC.   6/13 -- weaned to 4 L/min O2 --Mgmt of PNA, CHF and COPD as outlined --Supplement O2 to maintain spO2 > 88%, wean as tolerated --Goal for discharge, hopefully < 4 L/min --Home O2 qualification in next day or two --Incentive spirometer  Acute on Chronic HFrEF  ECHO -- EF 35-40% 6/11 - increasing O2 needs.  CXR with signs of volume overload.  Lasix  started with good response. 6/12--13 - ongoing diuresis. Weaned from 8 >> 4 L O2. --Continue IV Lasix  40 mg BID --Strict Io's and daily weights --Monitor renal function and electrolytes   Ground-level fall PT recommended rehab   Non-small cell lung cancer (HCC) History of non-small cell lung cancer diagnosed earlier this year with screening CT.  She is not a candidate for bronchoscopy for tissue sampling or surgical intervention.  She has completed SBRT with her last session on  April 30.   Elevated bilirubin Elevated total bilirubin on admission - resolved.   Thrush Continue Diflucan .   Constipation Bowel regimen per orders   Hypokalemia Replaced   Thrombocytopenia (HCC) Today's platelet count 129.   CKD (chronic kidney disease), stage III (HCC) Per chart review, patient has a history of CKD stage IIIa with baseline creatinine ranging between 1.1 and  1.3, currently 1.0.  Last creatinine 0.64.   Type 2 diabetes mellitus (HCC) History of diet-controlled type 2 diabetes with last A1c of 6.5% No indication for SSI Monitor CBGs given steroid use   Hypertension Continue Coreg       Subjective: Pt up in recliner today on rounds.  Reports ongoing cough but little better.  Denies chest pain or fever/chills.  Reports a lot of urine output with Lasix .  No other acute complaints.    Physical Exam: Vitals:   02/10/24 1935 02/10/24 1953 02/11/24 0524 02/11/24 0731  BP: 135/65  (!) 158/69 (!) 143/50  Pulse: 66  60 (!) 58  Resp: 18  18 16   Temp: (!) 97.5 F (36.4 C)  (!) 97.1 F (36.2 C) 97.7 F (36.5 C)  TempSrc:    Oral  SpO2: 96% 93% 93% 94%  Weight:      Height:       General exam: awake, alert, no acute distress HEENT: edentulous, moist mucus membranes, hearing grossly normal  Respiratory system: decreased breath sounds, scattered rhonchi vs upper airway sounds, wet sounding cough, on 4 L/min HFNC O2, no accessory muscle use at rest Cardiovascular system: normal S1/S2, RRR,  no pedal edema.   Gastrointestinal system: soft, NT, ND Central nervous system: no gross focal neurologic deficits, normal speech, A&Ox3 Skin: dry, intact, normal temperature Psychiatry: normal mood and affect  Data Reviewed:  No new labs today  Family Communication: Son, [name], updated by phone afternoon 6/12  Disposition: Status is: Inpatient Remains inpatient appropriate because: ongoing high oxygen needs, requiring IV meds including Lasix , antibiotics.   Planned Discharge Destination: Skilled nursing facility recommended    Time spent: 42 minutes  Author: Montey Apa, DO 02/11/2024 3:37 PM  For on call review [email or web address].

## 2024-02-11 NOTE — Progress Notes (Signed)
 Subjective  Sarah White is feeling better today. Breathing has improved.  CXR 06/11 with increased vascular congestion.  Echocardiogram 06/12 with EF 35-40%, moderately decreased function. Unable to assess RWMA. RV function is preserved.  S/p IV lasix  40mg  x1 yesterday with good response. Continues to diurese well. -1.2L in the last 24 hours.     Physical exam GEN no acute distress HEENT supple neck, reactive pupils, EOMI Lungs decreased air movement with prolonged expiratory phase CVS normal S1, normal S2, regular rate and rhythm Abdomen soft nontender nondistended Extremities warm well-perfused no edema  Labs and imaging were reviewed  Assessment and plan Sarah White is a 78 year old female patient with a past medical history of tobacco use, suspecting COPD on Bevespi follows with Dr. Viva Grise, nonischemic cardiomyopathy with EF of 30% in 2021 who presented to [facility] on 06/06 with worsening shortness of breath.  She was found to have right-sided consolidation secondary to strep pneumonia complicated by strep pneumonia bacteremia  # Acute hypoxic respiratory failure secondary to # Strep pneumo pneumonia with bacteremia # COPD on Bevespi # Heart failure with reduced EF 30% in 2021 #Now with pulmonary edema   Appears that she has been improving over the course of the 4 days however still with some shortness of breath specifically on exertion.  CXR with pulmonary edema. Started on lasix  with good response.   Plan []  c/w Brovana  twice daily and Yupelri  daily while inpatient. She can go back on bevespi upon discharge.  []  continue weaning off steroids down by 10 mg every 3 days until off. . []  Antibiotic management per primary team. []  Agree with diuresis per primary team.  []  Recommend incentive spirometer, out of bed to chair. []  Will arrange close follow up with Dr. Viva Grise.   Pulmonary will sign off. Please do not hesitate to contact us  back with any questions or concerns.   I spent  40 minutes caring for this patient today, including preparing to see the patient, obtaining a medical history , reviewing a separately obtained history, performing a medically appropriate examination and/or evaluation, counseling and educating the patient/family/caregiver, documenting clinical information in the electronic health record, and independently interpreting results (not separately reported/billed) and communicating results to the patient/family/caregiver  Annitta Kindler, MD [facility] 02/11/2024 10:26 AM

## 2024-02-11 NOTE — Progress Notes (Signed)
 Heart Failure Navigator Progress Note  Assessed for Heart & Vascular TOC clinic readiness.  Patient does not meet criteria due to [facility] patient.  Navigator will sign off at this time.   Roxy Horseman, RN, BSN [facility] Heart Failure Navigator Secure Chat Only

## 2024-02-12 DIAGNOSIS — B953 Streptococcus pneumoniae as the cause of diseases classified elsewhere: Secondary | ICD-10-CM | POA: Diagnosis not present

## 2024-02-12 DIAGNOSIS — R7881 Bacteremia: Secondary | ICD-10-CM | POA: Diagnosis not present

## 2024-02-12 LAB — BASIC METABOLIC PANEL WITH GFR
Anion gap: 12 (ref 5–15)
BUN: 22 mg/dL (ref 8–23)
CO2: 34 mmol/L — ABNORMAL HIGH (ref 22–32)
Calcium: 8.4 mg/dL — ABNORMAL LOW (ref 8.9–10.3)
Chloride: 92 mmol/L — ABNORMAL LOW (ref 98–111)
Creatinine, Ser: 0.78 mg/dL (ref 0.44–1.00)
GFR, Estimated: >60 mL/min (ref >60)
Glucose, Bld: 109 mg/dL — ABNORMAL HIGH (ref 70–99)
Potassium: 4.2 mmol/L (ref 3.5–5.1)
Sodium: 138 mmol/L (ref 135–145)

## 2024-02-12 LAB — CULTURE, BLOOD (ROUTINE X 2)
Culture: NO GROWTH
Culture: NO GROWTH
Special Requests: ADEQUATE
Special Requests: ADEQUATE

## 2024-02-12 LAB — GLUCOSE, CAPILLARY
Glucose-Capillary: 88 mg/dL (ref 70–99)
Glucose-Capillary: 185 mg/dL — ABNORMAL HIGH (ref 70–99)
Glucose-Capillary: 321 mg/dL — ABNORMAL HIGH (ref 70–99)
Glucose-Capillary: 257 mg/dL — ABNORMAL HIGH (ref 70–99)

## 2024-02-12 MED ORDER — SENNOSIDES-DOCUSATE SODIUM 8.6-50 MG PO TABS
1.0000 | ORAL_TABLET | Freq: Two times a day (BID) | ORAL | Status: DC
  Administered 2024-02-12 – 2024-02-15 (×7): 1 via ORAL
  Filled 2024-02-12 (×7): qty 1

## 2024-02-12 MED ORDER — IBUPROFEN 400 MG PO TABS
400.0000 mg | ORAL_TABLET | Freq: Four times a day (QID) | ORAL | Status: DC | PRN
  Administered 2024-02-12: 400 mg via ORAL
  Filled 2024-02-12: qty 1

## 2024-02-12 MED ORDER — POLYETHYLENE GLYCOL 3350 17 G PO PACK
17.0000 g | PACK | Freq: Every day | ORAL | Status: DC
  Administered 2024-02-12 – 2024-02-14 (×3): 17 g via ORAL
  Filled 2024-02-12 (×4): qty 1

## 2024-02-12 NOTE — Plan of Care (Signed)

## 2024-02-12 NOTE — Progress Notes (Signed)
 Progress Note   Patient: Sarah White ZOX:[id number] DOB: 25-Jan-1946 DOA: 02/04/2024     7 DOS: the patient was seen and examined on 02/12/2024   Brief hospital course: HPI on admission: 78 y.o. female with medical history significant of Non-small cell lung cancer s/p SBRT, COPD, HFrEF with last EF 40%, mitral regurgitation, hypertension, uterine carcinoma s/p hysterectomy, diet-controlled type 2 diabetes, who presents to the ED due to shortness of breath.   Sarah White states that approximately 1 week ago, she was walking in her home when she had a ground-level fall where she landed on her right side.  Her son was able to help her up, however the next day she fell again landing on the same side.  Since then, she has been experiencing severe right rib pain and right flank pain.  1 day after the second fall, she began to experience a productive cough with shortness of breath.  She endorses fever, poor appetite but denies any nausea, vomiting.  She denies any diarrhea.   ED course: On arrival to the ED, patient was normotensive at 139/83 with heart rate of 82.  She was saturating at 93% on 3 L.  She was afebrile at 98.5.  Initial workup notable for sodium 132, bicarb 23, BUN 27, creat 1.03, total bilirubin 2.9 and GFR 57. CBC still pending. COVID-19, influenza and RSV PCR negative. Chest xray with dense consolidation of the right lower lobe. Patient started on Azithromycin  and Rocephin , as well as Solumedrol and Duonebs. TRH contacted for admission.   Patient was admitted on 02/04/2024 for further evaluation and management of acute respiratory failure with hypoxia.  Subsequently found to have Streptococcus pneumonia and bacteremia as outlined in detail below.  I assumed care 02/09/2024. Further hospital course and management as outlined below.   Assessment and Plan:  * Bacteremia and Multifocal pneumonia due to Streptococcus pneumoniae Streptococcus pneumoniae sensitive to Rocephin .   --Continue  Rocephin  --Follow repeat blood cultures - neg to date --Pulmonology following --Transitioned IV Solu-medrol  >> tapering Prednisone   --Incentive spirometer --OOB to chair, mobilize --Continue Lasix  40 mg IV BID --Pulmicort  nebs BID --Per Pulmonology, added Brovana  neb BID, Yupelri  neb daily --Mucinex  BID, PRN Tussionex    COPD with acute exacerbation  --Mgmt as above with steroids and nebs   Acute respiratory failure with hypoxia - due to PNA, COPD, CHF decompensation, pleural effusion, hx lung cancer.   No O2 requirement at baseline.  ER physician documented pulse ox of 75% on room air.   6/12 -- Increased O2 needs from 5 >> 8 L/min HFNC.   6/13 -- weaned to 4 L/min O2 6/14 - on 3-4 L --Mgmt of PNA, CHF and COPD as outlined --Supplement O2 to maintain spO2 > 88%, wean as tolerated --Goal for discharge, hopefully < 4 L/min --Home O2 qualification in next day or two --Incentive spirometer  Acute on Chronic HFrEF  ECHO -- EF 35-40% 6/11 - increasing O2 needs.  CXR with signs of volume overload.  Lasix  started with good response. 6/12--13 - ongoing diuresis. Weaned from 8 >> 4 L O2. 6/14 - O2 needs 3-4 L, renal function stable --Continue IV Lasix  40 mg BID --Strict Io's and daily weights --Monitor renal function and electrolytes   Ground-level fall PT recommended rehab   Non-small cell lung cancer (HCC) History of non-small cell lung cancer diagnosed earlier this year with screening CT.  She is not a candidate for bronchoscopy for tissue sampling or surgical intervention.  She has completed  SBRT with her last session on April 30.   Elevated bilirubin Elevated total bilirubin on admission - resolved.   Thrush Continue Diflucan .   Constipation Bowel regimen per orders   Hypokalemia Replaced   Thrombocytopenia (HCC) Today's platelet count 129.   CKD (chronic kidney disease), stage III (HCC) Per chart review, patient has a history of CKD stage IIIa with baseline  creatinine ranging between 1.1 and 1.3, currently 1.0.  Last creatinine 0.64.   Type 2 diabetes mellitus (HCC) History of diet-controlled type 2 diabetes with last A1c of 6.5% No indication for SSI Monitor CBGs given steroid use   Hypertension Continue Coreg       Subjective: Pt awake sitting up in bed this AM on rounds.  States I'm here.  Reports she is starting to cough up some phlegm.  Reports some nausea earlier, no vomiting. No fever/chills or other acute complaints.    Physical Exam: Vitals:   02/12/24 0440 02/12/24 0758 02/12/24 0819 02/12/24 1425  BP: (!) 138/57  126/70 (!) 125/47  Pulse: (!) 57  62 61  Resp: 18  17 18   Temp: 98.4 F (36.9 C)  98.3 F (36.8 C) 97.9 F (36.6 C)  TempSrc:   Oral Oral  SpO2: 94% 93% 94% 95%  Weight:      Height:       General exam: awake, alert, no acute distress HEENT: edentulous, moist mucus membranes, hearing grossly normal  Respiratory system: diminished breath sounds, intermittent wheezing, significantly improved coarse rhonchi, on 4 L/min HFNC O2, no accessory muscle use at rest Cardiovascular system: normal S1/S2, RRR,  no pedal edema.   Gastrointestinal system: soft, NT, ND Central nervous system: no gross focal neurologic deficits, normal speech, A&Ox3 Skin: dry, intact, normal temperature Psychiatry: normal mood and affect  Data Reviewed:  No new labs today  Family Communication: Son, [name], updated by phone afternoon 6/12. Will attempt to call as time allows.   Disposition: Status is: Inpatient Remains inpatient appropriate because: still weaning oxygen, requiring IV meds including Lasix , antibiotics.   Planned Discharge Destination: Skilled nursing facility recommended    Time spent: 38 minutes  Author: Montey Apa, DO 02/12/2024 2:40 PM  For on call review [email or web address].

## 2024-02-13 DIAGNOSIS — B953 Streptococcus pneumoniae as the cause of diseases classified elsewhere: Secondary | ICD-10-CM | POA: Diagnosis not present

## 2024-02-13 DIAGNOSIS — R7881 Bacteremia: Secondary | ICD-10-CM | POA: Diagnosis not present

## 2024-02-13 LAB — BASIC METABOLIC PANEL WITH GFR
Anion gap: 11 (ref 5–15)
BUN: 22 mg/dL (ref 8–23)
CO2: 33 mmol/L — ABNORMAL HIGH (ref 22–32)
Calcium: 8.4 mg/dL — ABNORMAL LOW (ref 8.9–10.3)
Chloride: 92 mmol/L — ABNORMAL LOW (ref 98–111)
Creatinine, Ser: 0.78 mg/dL (ref 0.44–1.00)
GFR, Estimated: >60 mL/min (ref >60)
Glucose, Bld: 94 mg/dL (ref 70–99)
Potassium: 4.1 mmol/L (ref 3.5–5.1)
Sodium: 136 mmol/L (ref 135–145)

## 2024-02-13 LAB — GLUCOSE, CAPILLARY
Glucose-Capillary: 90 mg/dL (ref 70–99)
Glucose-Capillary: 186 mg/dL — ABNORMAL HIGH (ref 70–99)
Glucose-Capillary: 195 mg/dL — ABNORMAL HIGH (ref 70–99)

## 2024-02-13 MED ORDER — INSULIN ASPART 100 UNIT/ML IJ SOLN
0.0000 [IU] | Freq: Three times a day (TID) | INTRAMUSCULAR | Status: DC
  Administered 2024-02-13: 2 [IU] via SUBCUTANEOUS
  Administered 2024-02-14: 3 [IU] via SUBCUTANEOUS
  Administered 2024-02-14: 2 [IU] via SUBCUTANEOUS
  Filled 2024-02-13 (×3): qty 1

## 2024-02-13 NOTE — Plan of Care (Signed)

## 2024-02-13 NOTE — Plan of Care (Signed)
  Problem: Education: Goal: Knowledge of disease or condition will improve Outcome: Progressing   Problem: Activity: Goal: Ability to tolerate increased activity will improve Outcome: Progressing Goal: Will verbalize the importance of balancing activity with adequate rest periods Outcome: Progressing   Problem: Clinical Measurements: Goal: Ability to maintain a body temperature in the normal range will improve Outcome: Progressing

## 2024-02-13 NOTE — Progress Notes (Signed)
 Progress Note   Patient: Sarah White ZOX:[id number] DOB: [birth year] DOA: 02/04/2024     8 DOS: the patient was seen and examined on 02/13/2024   Brief hospital course: HPI on admission: 78 y.o. female with medical history significant of Non-small cell lung cancer s/p SBRT, COPD, HFrEF with last EF 40%, mitral regurgitation, hypertension, uterine carcinoma s/p hysterectomy, diet-controlled type 2 diabetes, who presents to the ED due to shortness of breath.   Sarah White states that approximately 1 week ago, she was walking in her home when she had a ground-level fall where she landed on her right side.  Her son was able to help her up, however the next day she fell again landing on the same side.  Since then, she has been experiencing severe right rib pain and right flank pain.  1 day after the second fall, she began to experience a productive cough with shortness of breath.  She endorses fever, poor appetite but denies any nausea, vomiting.  She denies any diarrhea.   ED course: On arrival to the ED, patient was normotensive at 139/83 with heart rate of 82.  She was saturating at 93% on 3 L.  She was afebrile at 98.5.  Initial workup notable for sodium 132, bicarb 23, BUN 27, creat 1.03, total bilirubin 2.9 and GFR 57. CBC still pending. COVID-19, influenza and RSV PCR negative. Chest xray with dense consolidation of the right lower lobe. Patient started on Azithromycin  and Rocephin , as well as Solumedrol and Duonebs. TRH contacted for admission.   Patient was admitted on 02/04/2024 for further evaluation and management of acute respiratory failure with hypoxia.  Subsequently found to have Streptococcus pneumonia and bacteremia as outlined in detail below.  I assumed care 02/09/2024. Further hospital course and management as outlined below.   Assessment and Plan:  * Bacteremia and Multifocal pneumonia due to Streptococcus pneumoniae Streptococcus pneumoniae sensitive to Rocephin .   --Continue  Rocephin  --Follow repeat blood cultures - neg to date --Pulmonology following --Transitioned IV Solu-medrol  >> tapering Prednisone   --Incentive spirometer --OOB to chair, mobilize --Continue Lasix  40 mg IV BID --Pulmicort  nebs BID --Per Pulmonology, added Brovana  neb BID, Yupelri  neb daily --Mucinex  BID, PRN Tussionex    COPD with acute exacerbation  --Mgmt as above with steroids and nebs   Acute respiratory failure with hypoxia - due to PNA, COPD, CHF decompensation, pleural effusion, hx lung cancer.   No O2 requirement at baseline.  ER physician documented pulse ox of 75% on room air.   6/12 -- Increased O2 needs from 5 >> 8 L/min HFNC.   6/13 -- weaned to 4 L/min O2 6/14 - on 3-4 L 6/15 - on 2 L --Mgmt of PNA, CHF and COPD as outlined --Supplement O2 to maintain spO2 > 88%, wean as tolerated --Goal for discharge, hopefully < 4 L/min --Home O2 qualification in next day or two --Incentive spirometer  Acute on Chronic HFrEF  ECHO -- EF 35-40% 6/11 - increasing O2 needs.  CXR with signs of volume overload.  Lasix  started with good response. 6/12--13 - ongoing diuresis. Weaned from 8 >> 4 L O2. 6/14 - O2 needs 3-4 L, renal function stable 6/15 - on 2 L O2, renal function stable, still good urine output --Continue IV Lasix  40 mg BID --Strict Io's and daily weights --Monitor renal function and electrolytes   Ground-level fall PT recommended rehab   Non-small cell lung cancer (HCC) History of non-small cell lung cancer diagnosed earlier this year with screening CT.  She is not a candidate for bronchoscopy for tissue sampling or surgical intervention.  She has completed SBRT with her last session on April 30.   Elevated bilirubin Elevated total bilirubin on admission - resolved.   Thrush Continue Diflucan    Constipation Bowel regimen per orders   Hypokalemia Replaced & resolved   Thrombocytopenia (HCC) Monitor CBC   CKD (chronic kidney disease), stage III  (HCC) Per chart review, patient has a history of CKD stage IIIa with baseline creatinine ranging between 1.1 and 1.3, currently 1.0.   Last creatinine 0.78. --Monitor BMP   Type 2 diabetes mellitus (HCC) History of diet-controlled type 2 diabetes with last A1c of 6.5% Elevated postprandial sugars noted, >300 yesterday at dinner Start sliding scale Novolog TID WC for post-prandial steroid-induced hyperglycemia   Hypertension Continue Coreg       Subjective: Pt awake sitting up in bed this AM on rounds.  Reports feeling better.  Cough improving but now is coughing up phlegm where she couldn't before. Feels things are breaking up.  Still high urine output on Lasix .  No other acute complaints.    Physical Exam: Vitals:   02/13/24 0422 02/13/24 0500 02/13/24 0738 02/13/24 1046  BP: (!) 111/47  (!) 124/49   Pulse: 60  63 65  Resp: 15  15   Temp: 98.6 F (37 C)  97.8 F (36.6 C)   TempSrc:   Oral   SpO2: 97%  98% 91%  Weight:  75 kg    Height:       General exam: awake, alert, no acute distress HEENT: edentulous, moist mucus membranes, hearing grossly normal  Respiratory system: diminished breath sounds, intermittent wheezing, significantly improved coarse rhonchi, on 4 L/min HFNC O2, no accessory muscle use at rest Cardiovascular system: normal S1/S2, RRR,  no pedal edema.   Gastrointestinal system: soft, NT, ND Central nervous system: no gross focal neurologic deficits, normal speech, A&Ox3 Skin: dry, intact, normal temperature Psychiatry: normal mood and affect  Data Reviewed:  No new labs today  Family Communication: Son, [name], updated by phone afternoon 6/12. Will attempt to call as time allows.   Disposition: Status is: Inpatient Remains inpatient appropriate because: weaning oxygen, requiring IV meds including Lasix , antibiotics.  Needs SNF placement.   Planned Discharge Destination: Skilled nursing facility recommended    Time spent: 38  minutes  Author: Montey Apa, DO 02/13/2024 2:25 PM  For on call review [email or web address].

## 2024-02-14 DIAGNOSIS — R7881 Bacteremia: Secondary | ICD-10-CM | POA: Diagnosis not present

## 2024-02-14 DIAGNOSIS — B953 Streptococcus pneumoniae as the cause of diseases classified elsewhere: Secondary | ICD-10-CM | POA: Diagnosis not present

## 2024-02-14 LAB — BASIC METABOLIC PANEL WITH GFR
Anion gap: 10 (ref 5–15)
BUN: 24 mg/dL — ABNORMAL HIGH (ref 8–23)
CO2: 36 mmol/L — ABNORMAL HIGH (ref 22–32)
Calcium: 8.5 mg/dL — ABNORMAL LOW (ref 8.9–10.3)
Chloride: 91 mmol/L — ABNORMAL LOW (ref 98–111)
Creatinine, Ser: 0.88 mg/dL (ref 0.44–1.00)
GFR, Estimated: >60 mL/min (ref >60)
Glucose, Bld: 128 mg/dL — ABNORMAL HIGH (ref 70–99)
Potassium: 4.3 mmol/L (ref 3.5–5.1)
Sodium: 137 mmol/L (ref 135–145)

## 2024-02-14 LAB — GLUCOSE, CAPILLARY
Glucose-Capillary: 97 mg/dL (ref 70–99)
Glucose-Capillary: 220 mg/dL — ABNORMAL HIGH (ref 70–99)
Glucose-Capillary: 173 mg/dL — ABNORMAL HIGH (ref 70–99)

## 2024-02-14 MED ORDER — BISACODYL 10 MG RE SUPP
10.0000 mg | Freq: Every day | RECTAL | Status: DC | PRN
  Administered 2024-02-14: 10 mg via RECTAL
  Filled 2024-02-14: qty 1

## 2024-02-14 MED ORDER — FUROSEMIDE 40 MG PO TABS
40.0000 mg | ORAL_TABLET | Freq: Every day | ORAL | Status: DC
  Administered 2024-02-15: 40 mg via ORAL
  Filled 2024-02-14: qty 1

## 2024-02-14 NOTE — Progress Notes (Signed)
 Physical Therapy Treatment Patient Details Name: Sarah White MRN: [id number] DOB: [birth year] Today's Date: 02/14/2024   History of Present Illness Sarah White is a 78 y.o. female with medical history significant of Non-small cell lung cancer s/p SBRT, COPD, HFrEF with last EF 40%, mitral regurgitation, hypertension, uterine carcinoma s/p hysterectomy, diet-controlled type 2 diabetes, who presents to the ED due to shortness of breath.    PT Comments  Pt received seated in recliner upon arrival to room and pt agreeable to therapy.  Nurse notified therapist that a suppository needed to be placed, and therapist asked if getting the pt up in the [facility] would assist in being able to place it.  Pt ready to attempt to stand, however unable to perform without modA from therapist and nursing student.  Pt able to assist with use of the Stedy and B UE pulling from the front.  When in standing, pt with R lateral lean.  Nursing able to place suppository before returning the pt to the bed.  Pt assisted with proper placement in bed and left with all needs met.  Pt left with call bell within reach.      If plan is discharge home, recommend the following: A lot of help with walking and/or transfers;A lot of help with bathing/dressing/bathroom;Help with stairs or ramp for entrance;Assist for transportation   Can travel by private vehicle     No  Equipment Recommendations  Other (comment) (TBD at next venue of care.)    Recommendations for Other Services       Precautions / Restrictions Precautions Precautions: Fall Recall of Precautions/Restrictions: Impaired Restrictions Weight Bearing Restrictions Per Provider Order: No     Mobility  Bed Mobility           Sit to supine: HOB elevated, Used rails, Mod assist   General bed mobility comments: pt in recliner upon arrival.  Steady +2 utilized for return to bed    Transfers Overall transfer level: Needs assistance Equipment used: Ambulation  equipment used Transfers: Sit to/from Stand, Bed to chair/wheelchair/BSC Sit to Stand: Mod assist, +2 physical assistance             Transfer via Lift Equipment: Stedy  Ambulation/Gait               General Gait Details: deferred due to weakness and pt's inability to stand for >30 sec with use of Stedy.   Stairs             Wheelchair Mobility     Tilt Bed    Modified Rankin (Stroke Patients Only)       Balance Overall balance assessment: Needs assistance Sitting-balance support: Feet supported, Bilateral upper extremity supported Sitting balance-Leahy Scale: Fair Sitting balance - Comments: Pt requires BUE support when sitting without back support, demonstrates posterior and lateral lean with poor trunk control; Postural control: Posterior lean Standing balance support: During functional activity, Bilateral upper extremity supported, Reliant on assistive device for balance Standing balance-Leahy Scale: Zero Standing balance comment: in stedy                            Communication Communication Communication: No apparent difficulties Factors Affecting Communication: Hearing impaired  Cognition Arousal: Alert Behavior During Therapy: Flat affect                             Following commands: Impaired Following commands impaired: Follows  one step commands with increased time    Cueing Cueing Techniques: Verbal cues  Exercises      General Comments        Pertinent Vitals/Pain Pain Assessment Pain Assessment: No/denies pain Pain Score: 0-No pain    Home Living                          Prior Function            PT Goals (current goals can now be found in the care plan section) Acute Rehab PT Goals Patient Stated Goal: to return to PLOF PT Goal Formulation: With patient Time For Goal Achievement: 02/19/24 Potential to Achieve Goals: Fair Progress towards PT goals: Progressing toward goals     Frequency    Min 2X/week      PT Plan      Co-evaluation              AM-PAC PT 6 Clicks Mobility   Outcome Measure  Help needed turning from your back to your side while in a flat bed without using bedrails?: A Lot Help needed moving from lying on your back to sitting on the side of a flat bed without using bedrails?: A Lot   Help needed standing up from a chair using your arms (e.g., wheelchair or bedside chair)?: A Lot Help needed to walk in hospital room?: A Lot Help needed climbing 3-5 steps with a railing? : Total 6 Click Score: 9    End of Session Equipment Utilized During Treatment: Gait belt Activity Tolerance: Patient tolerated treatment well;Patient limited by fatigue Patient left: in bed;with call bell/phone within reach;with bed alarm set Nurse Communication: Mobility status PT Visit Diagnosis: Unsteadiness on feet (R26.81);Other abnormalities of gait and mobility (R26.89);Muscle weakness (generalized) (M62.81);History of falling (Z91.81);Difficulty in walking, not elsewhere classified (R26.2);Pain Pain - Right/Left: Left Pain - part of body: Leg     Time: 0981-1914 PT Time Calculation (min) (ACUTE ONLY): 18 min  Charges:    $Therapeutic Activity: 8-22 mins PT General Charges $$ ACUTE PT VISIT: 1 Visit                     Rozanna Corner, PT, DPT Physical Therapist - [facility]  02/14/24, 4:12 PM

## 2024-02-14 NOTE — Progress Notes (Signed)
 Progress Note   Patient: Sarah White ZOX:[id number] DOB: [birth year] DOA: 02/04/2024     9 DOS: the patient was seen and examined on 02/14/2024   Brief hospital course: HPI on admission: 78 y.o. female with medical history significant of Non-small cell lung cancer s/p SBRT, COPD, HFrEF with last EF 40%, mitral regurgitation, hypertension, uterine carcinoma s/p hysterectomy, diet-controlled type 2 diabetes, who presents to the ED due to shortness of breath.   Sarah White states that approximately 1 week ago, she was walking in her home when she had a ground-level fall where she landed on her right side.  Her son was able to help her up, however the next day she fell again landing on the same side.  Since then, she has been experiencing severe right rib pain and right flank pain.  1 day after the second fall, she began to experience a productive cough with shortness of breath.  She endorses fever, poor appetite but denies any nausea, vomiting.  She denies any diarrhea.   ED course: On arrival to the ED, patient was normotensive at 139/83 with heart rate of 82.  She was saturating at 93% on 3 L.  She was afebrile at 98.5.  Initial workup notable for sodium 132, bicarb 23, BUN 27, creat 1.03, total bilirubin 2.9 and GFR 57. CBC still pending. COVID-19, influenza and RSV PCR negative. Chest xray with dense consolidation of the right lower lobe. Patient started on Azithromycin  and Rocephin , as well as Solumedrol and Duonebs. TRH contacted for admission.   Patient was admitted on 02/04/2024 for further evaluation and management of acute respiratory failure with hypoxia.  Subsequently found to have Streptococcus pneumonia and bacteremia as outlined in detail below.  I assumed care 02/09/2024. Further hospital course and management as outlined below.   Assessment and Plan:  * Bacteremia and Multifocal pneumonia due to Streptococcus pneumoniae Streptococcus pneumoniae sensitive to Rocephin .   --Completed  course of Rocephin  --Repeat blood cultures negative --Pulmonology has signed off --Transitioned IV Solu-medrol  >> tapering Prednisone , on 10 mg daily --Incentive spirometer --OOB to chair, mobilize --Continue Lasix  40 mg IV BID --Pulmicort  nebs BID --Per Pulmonology, added Brovana  neb BID, Yupelri  neb daily --Mucinex  BID, PRN Tussionex    COPD with acute exacerbation  --Mgmt as above with steroids and nebs   Acute respiratory failure with hypoxia - due to PNA, COPD, CHF decompensation, pleural effusion, hx lung cancer.   No O2 requirement at baseline.  ER physician documented pulse ox of 75% on room air.   6/12 -- Increased O2 needs from 5 >> 8 L/min HFNC.   6/13 -- weaned to 4 L/min O2 6/14 - on 3-4 L 6/15--16 - on 2 L --Mgmt of PNA, CHF and COPD as outlined --Supplement O2 to maintain spO2 > 88%, wean as tolerated --Goal for discharge, hopefully < 4 L/min --Home O2 qualification in next day or two --Incentive spirometer  Acute on Chronic HFrEF  ECHO -- EF 35-40% 6/11 - increasing O2 needs.  CXR with signs of volume overload.  Lasix  started with good response.  Started on diuresis with IV Lasix . 6/16 - renal function stable, pt beginning to appear clinically dry --One more dose IV Lasix  40 mg this AM --Transition to PO Lasix  40 mg daily tomorrow AM --Strict Io's and daily weights --Monitor renal function and electrolytes   Ground-level fall PT recommended rehab   Non-small cell lung cancer (HCC) History of non-small cell lung cancer diagnosed earlier this year with screening CT.  She is not a candidate for bronchoscopy for tissue sampling or surgical intervention.  She has completed SBRT with her last session on April 30.   Elevated bilirubin Elevated total bilirubin on admission - resolved.   Thrush Continue Diflucan    Constipation Bowel regimen per orders   Hypokalemia Replaced & resolved   Thrombocytopenia (HCC) Monitor CBC   CKD (chronic kidney disease),  stage III (HCC) Per chart review, patient has a history of CKD stage IIIa with baseline creatinine ranging between 1.1 and 1.3, currently 1.0.   Creatinine 0.78 >> 0.88 stable. --Monitor BMP   Type 2 diabetes mellitus (HCC) History of diet-controlled type 2 diabetes with last A1c of 6.5% Elevated postprandial sugars noted, >300 yesterday at dinner Start sliding scale Novolog TID WC for post-prandial steroid-induced hyperglycemia   Hypertension Continue Coreg       Subjective: Pt awake resting in bed this AM. She reports feeling better.  Cough is improving, able to produce phlegm more easily.  Denies acute complaints.      Physical Exam: Vitals:   02/13/24 2205 02/14/24 0351 02/14/24 0601 02/14/24 0715  BP:  (!) 113/57  128/60  Pulse:  61  65  Resp:  16  15  Temp:  97.7 F (36.5 C)  97.6 F (36.4 C)  TempSrc:    Oral  SpO2: 90% 96%  95%  Weight:   75 kg   Height:       General exam: awake, alert, no acute distress HEENT: edentulous, moist mucus membranes, hearing grossly normal  Respiratory system: lungs clear diminished bases, no wheezes or rhonchi, on 2 L/min HFNC O2, no accessory muscle use at rest Cardiovascular system: normal S1/S2, RRR,  no pedal edema.   Gastrointestinal system: soft, NT, ND Central nervous system: no gross focal neurologic deficits, normal speech, A&Ox3 Skin: dry, intact, normal temperature Psychiatry: normal mood and affect  Data Reviewed:  No new labs today  Family Communication: Son, [name], updated by phone afternoon 6/12. Will attempt to call as time allows.   Disposition: Status is: Inpatient Remains inpatient appropriate because: weaning oxygen, requiring IV meds including Lasix , antibiotics.  Needs SNF placement.   Planned Discharge Destination: Skilled nursing facility recommended    Time spent: 38 minutes  Author: Montey Apa, DO 02/14/2024 12:36 PM  For on call review [email or web address].

## 2024-02-14 NOTE — Plan of Care (Signed)
  Problem: Respiratory: Goal: Levels of oxygenation will improve Outcome: Progressing   Problem: Respiratory: Goal: Ability to maintain a clear airway will improve Outcome: Progressing   Problem: Nutrition: Goal: Adequate nutrition will be maintained Outcome: Progressing   Problem: Coping: Goal: Level of anxiety will decrease Outcome: Progressing   Problem: Pain Managment: Goal: General experience of comfort will improve and/or be controlled Outcome: Progressing

## 2024-02-14 NOTE — Progress Notes (Signed)
 Occupational Therapy Treatment Patient Details Name: Sarah White MRN: [id number] DOB: 09-26-1945 Today's Date: 02/14/2024   History of present illness Sarah White is a 78 y.o. female with medical history significant of Non-small cell lung cancer s/p SBRT, COPD, HFrEF with last EF 40%, mitral regurgitation, hypertension, uterine carcinoma s/p hysterectomy, diet-controlled type 2 diabetes, who presents to the ED due to shortness of breath.   OT comments  Pt received in bed with PT and NSG staff in room. Bed level tasks completed due to recent suppository. MIN A for grooming tasks, OT reviewed spirometer. Pt continues to demo difficulties increasing volume despite cues and education provided. While upright in bed, pt demos R lateral lean requiring MAX A to reposition as she is unable to self-correct. MAX A to roll (+2 for bed pad placement) due to need for urgent BM. RN notified of pt's position. OT will continue to follow, discharge recommendation remains appropriate.       If plan is discharge home, recommend the following:  Assistance with cooking/housework;Direct supervision/assist for medications management;Direct supervision/assist for financial management;Assist for transportation;Two people to help with walking and/or transfers;A lot of help with bathing/dressing/bathroom;Supervision due to cognitive status         Precautions / Restrictions Precautions Precautions: Fall Recall of Precautions/Restrictions: Impaired Restrictions Weight Bearing Restrictions Per Provider Order: No       Mobility Bed Mobility Overal bed mobility: Needs Assistance Bed Mobility: Rolling Rolling: Max assist         General bed mobility comments: maxA +1 to roll for bed pan placement (+2 to put pan under pt)    Transfers Overall transfer level: Needs assistance                 General transfer comment: NT     Balance Overall balance assessment: Needs assistance     Sitting balance -  Comments: NT       Standing balance comment: NT                           ADL either performed or assessed with clinical judgement   ADL Overall ADL's : Needs assistance/impaired     Grooming: Bed level;Oral care;Wash/dry face;Wash/dry hands;Brushing hair Grooming Details (indicate cue type and reason): denture mgmt, oral care and brushing hair bed level due to recent suppository                               General ADL Comments: OOB mobility deferred - pt just back in bed after working with PT and recent suppository     Communication Communication Communication: No apparent difficulties Factors Affecting Communication: Hearing impaired   Cognition Arousal: Alert Behavior During Therapy: Flat affect Cognition: Cognition impaired     Awareness: Online awareness impaired Memory impairment (select all impairments): Short-term memory Attention impairment (select first level of impairment): Selective attention Executive functioning impairment (select all impairments): Problem solving                   Following commands: Impaired Following commands impaired: Follows one step commands with increased time      Cueing   Cueing Techniques: Verbal cues  Exercises Exercises: Other exercises Other Exercises Other Exercises: reviwed IS with pt            Pertinent Vitals/ Pain       Pain Assessment Pain Assessment: No/denies pain Pain Score:  0-No pain   Frequency  Min 2X/week        Progress Toward Goals  OT Goals(current goals can now be found in the care plan section)  Progress towards OT goals: Progressing toward goals  Acute Rehab OT Goals OT Goal Formulation: With patient/family Time For Goal Achievement: 02/19/24 Potential to Achieve Goals: Good ADL Goals Pt Will Perform Grooming: with contact guard assist;standing Pt Will Perform Lower Body Dressing: with contact guard assist;sit to/from stand Pt Will Transfer to  Toilet: with contact guard assist;ambulating Pt Will Perform Toileting - Clothing Manipulation and hygiene: with contact guard assist;sit to/from stand  Plan         AM-PAC OT 6 Clicks Daily Activity     Outcome Measure   Help from another person eating meals?: A Little Help from another person taking care of personal grooming?: A Little Help from another person toileting, which includes using toliet, bedpan, or urinal?: Total Help from another person bathing (including washing, rinsing, drying)?: A Lot Help from another person to put on and taking off regular upper body clothing?: A Lot Help from another person to put on and taking off regular lower body clothing?: Total 6 Click Score: 12    End of Session Equipment Utilized During Treatment: Oxygen  OT Visit Diagnosis: Unsteadiness on feet (R26.81);Other abnormalities of gait and mobility (R26.89);Muscle weakness (generalized) (M62.81);Repeated falls (R29.6)   Activity Tolerance Patient tolerated treatment well   Patient Left in bed;with call bell/phone within reach;with nursing/sitter in room;with bed alarm set   Nurse Communication Mobility status        Time: 1446-1500 OT Time Calculation (min): 14 min  Charges: OT General Charges $OT Visit: 1 Visit OT Treatments $Self Care/Home Management : 8-22 mins  Nadezhda Pollitt L. Jurgen Groeneveld, OTR/L  02/14/24, 3:12 PM

## 2024-02-14 NOTE — TOC Progression Note (Signed)
 Transition of Care [facility]) - Progression Note    Patient Details  Name: Sarah White MRN: [id number] Date of Birth: 04-11-1946  Transition of Care [facility]) CM/SW Contact  Odilia Bennett, LCSW Phone Number: 02/14/2024, 4:03 PM  Clinical Narrative:   [facility] can accept patient tomorrow.  Expected Discharge Plan: Skilled Nursing Facility Barriers to Discharge: Continued Medical Work up  Expected Discharge Plan and Services       Living arrangements for the past 2 months: Single Family Home                                       Social Determinants of Health (SDOH) Interventions SDOH Screenings   Food Insecurity: No Food Insecurity (02/04/2024)  Housing: Unknown (02/04/2024)  Transportation Needs: No Transportation Needs (02/04/2024)  Utilities: Not At Risk (02/04/2024)  Social Connections: Unknown (02/04/2024)  Tobacco Use: High Risk (02/04/2024)    Readmission Risk Interventions    02/08/2024   10:01 AM  Readmission Risk Prevention Plan  Transportation Screening Complete  PCP or Specialist Appt within 3-5 Days --  HRI or Home Care Consult --  Social Work Consult for Recovery Care Planning/Counseling --  Palliative Care Screening Not Applicable  Medication Review Oceanographer) Complete

## 2024-02-14 NOTE — Progress Notes (Signed)
 SLP Cancellation Note  Patient Details Name: Sarah White MRN: [id number] DOB: 12-Nov-1945   Cancelled treatment:       Reason Eval/Treat Not Completed: Other (comment). Per chart review- pt down to 2L nasal canula. Physician progress note reporting that pt is feeling better and cough is improving. No issue with PO intake reported by RN. No repeat chest imaging completed. Recommend continuation of current mech soft and thin liquid diet with general aspiration precautions. No follow up acute SLP services indicated at this time. SLP to sign off.   [place] Andres Escandon Clapp, MS, CCC-SLP Speech Language Pathologist Rehab Services; [facility] [phone] (ascom)   [place] J Clapp 02/14/2024, 11:50 AM

## 2024-02-14 NOTE — Plan of Care (Signed)
  Problem: Education: Goal: Knowledge of disease or condition will improve Outcome: Progressing Goal: Knowledge of the prescribed therapeutic regimen will improve Outcome: Progressing Goal: Individualized Educational Video(s) Outcome: Progressing   Problem: Activity: Goal: Ability to tolerate increased activity will improve Outcome: Progressing Goal: Will verbalize the importance of balancing activity with adequate rest periods Outcome: Progressing   Problem: Respiratory: Goal: Ability to maintain a clear airway will improve Outcome: Progressing Goal: Levels of oxygenation will improve Outcome: Progressing Goal: Ability to maintain adequate ventilation will improve Outcome: Progressing   Problem: Activity: Goal: Ability to tolerate increased activity will improve Outcome: Progressing   Problem: Clinical Measurements: Goal: Ability to maintain a body temperature in the normal range will improve Outcome: Progressing   Problem: Education: Goal: Knowledge of General Education information will improve Description: Including pain rating scale, medication(s)/side effects and non-pharmacologic comfort measures Outcome: Progressing   Problem: Respiratory: Goal: Ability to maintain adequate ventilation will improve Outcome: Progressing Goal: Ability to maintain a clear airway will improve Outcome: Progressing   Problem: Health Behavior/Discharge Planning: Goal: Ability to manage health-related needs will improve Outcome: Progressing   Problem: Clinical Measurements: Goal: Ability to maintain clinical measurements within normal limits will improve Outcome: Progressing Goal: Will remain free from infection Outcome: Progressing Goal: Diagnostic test results will improve Outcome: Progressing Goal: Respiratory complications will improve Outcome: Progressing Goal: Cardiovascular complication will be avoided Outcome: Progressing   Problem: Activity: Goal: Risk for activity  intolerance will decrease Outcome: Progressing   Problem: Elimination: Goal: Will not experience complications related to bowel motility Outcome: Progressing Goal: Will not experience complications related to urinary retention Outcome: Progressing

## 2024-02-14 NOTE — Progress Notes (Addendum)
 Heart Failure Stewardship Pharmacy Note  PCP: Solomon Dupre, DO PCP-Cardiologist: None  HPI: Sarah White is a 78 y.o. female with non-small cell lung cancer s/p SBRT, COPD, HFrEF, CAD, mitral regurgitation, hypertension, uterine carcinoma s/p hysterectomy, type 2 diabetes who presented with shortness of breath. On admission, lactic acid was 1. Chest x-ray 02/04/24 noted dense right basilar pneumonia. Repeat chest x-ray 02/09/24 showed increasing vascular congestion. Blood cultures positive for strep pneumo.   Pertinent cardiac history: Echo 07/2015 with LVEF 45-50%. LVEF improved to 50-55% in 10/2016. LHC at the same time showed two-vessel coronary artery disease with occluded proximal RCA, an occluded mid left circumflex, with contralateral collaterals to distal RCA. Echo 05/2019 with LVEF back down to 40-45%. Echo 11/2019 noted LVEF down to 30-35% and moderate to severe mitral stenosis.  [facility] 12/2019 with severe multivessel disease, normal left main, mild to moderate disease in the LAD, 100% mid circumflex, 100% percent proximal RCA, and extensive collaterals left to distal right. Echo this admission noted LVEF of 35-40% and did not note mitral stenosis.  Pertinent Lab Values: Creatinine, Ser  Date Value Ref Range Status  02/14/2024 0.88 0.44 - 1.00 mg/dL Final   BUN  Date Value Ref Range Status  02/14/2024 24 (H) 8 - 23 mg/dL Final   Potassium  Date Value Ref Range Status  02/14/2024 4.3 3.5 - 5.1 mmol/L Final   Sodium  Date Value Ref Range Status  02/14/2024 137 135 - 145 mmol/L Final   Magnesium  Date Value Ref Range Status  02/11/2024 2.1 1.7 - 2.4 mg/dL Final    Comment:    Performed at [facility], [address] [place], [place] [address]    Vital Signs:  Temp:  [97.7 F (36.5 C)-98.3 F (36.8 C)] 97.7 F (36.5 C) (06/16 0351) Pulse Rate:  [61-65] 61 (06/16 0351) Resp:  [15-18] 16 (06/16 0351) BP: (112-126)/(47-61) 113/57 (06/16 0351) SpO2:  [90 %-98 %]  96 % (06/16 0351) Weight:  [75 kg (165 lb 5.5 oz)] 75 kg (165 lb 5.5 oz) (06/16 0601)  Intake/Output Summary (Last 24 hours) at 02/14/2024 0721 Last data filed at 02/14/2024 0600 Gross per 24 hour  Intake --  Output 700 ml  Net -700 ml   Current Heart Failure Medications:  Loop diuretic: furosemide  40 mg IV BID Beta-Blocker: carvedilol  12.5 mg BID ACEI/ARB/ARNI: none MRA: none SGLT2i: none Other: none  Prior to admission Heart Failure Medications:  Loop diuretic: furosemide  40 mg daily Beta-Blocker: carvedilol  12.5 mg BID ACEI/ARB/ARNI: none MRA: none SGLT2i: none Other: none  Assessment: 1. Acute on chronic systolic heart failure (LVEF 35-40%)  , due to ICM. NYHA class IV symptoms.  -Symptoms: Resting in bed today peacefully. Breathing appears to be improved. Remains lethargic. -Volume: Appears to be euvolemic today. She is beginning to develop contraction alkalosis. BUN and creatinine are trending up. Consider one IV dose today, then transition to oral furosemide  40 mg tomorrow. -Hemodynamics: BP normal to high. HR 60s.  -BB: Currently on carvedilol  12.5 mg BID from home. HR is 60s. -ACEI/ARB/ARNI: Previous dizziness with lisinopril. BP stable, can consider adding losartan close to discharge.  -MRA: Previous hyperkalemia resolved. Consider adding spironolactone 12.5 mg daily prior to discharge. -SGLT2i: Would avoid adding at this time given bacteremia inreasing risk for euglycemic DKA.   Plan: 1) Medication changes recommended at this time: -Consider transition to oral furosemide  40 mg daily  2) Patient assistance: -Pending  3) Education: - Patient has been educated on current  HF medications and potential additions to HF medication regimen - Patient verbalizes understanding that over the next few months, these medication doses may change and more medications may be added to optimize HF regimen - Patient has been educated on basic disease state pathophysiology and goals  of therapy  Medication Assistance / Insurance Benefits Check: Does the patient have prescription insurance?    Type of insurance plan:  Does the patient qualify for medication assistance through manufacturers or grants? Pending   Outpatient Pharmacy: Prior to admission outpatient pharmacy: [facility]      Please do not hesitate to reach out with questions or concerns,  Bevely Brush, PharmD, CPP, BCPS Heart Failure Pharmacist  Phone - [phone] 02/14/2024 7:21 AM

## 2024-02-15 ENCOUNTER — Encounter: Payer: Self-pay | Admitting: Family Medicine

## 2024-02-15 DIAGNOSIS — Z95828 Presence of other vascular implants and grafts: Secondary | ICD-10-CM | POA: Diagnosis not present

## 2024-02-15 DIAGNOSIS — R55 Syncope and collapse: Secondary | ICD-10-CM | POA: Diagnosis not present

## 2024-02-15 DIAGNOSIS — D510 Vitamin B12 deficiency anemia due to intrinsic factor deficiency: Secondary | ICD-10-CM | POA: Diagnosis not present

## 2024-02-15 DIAGNOSIS — I5040 Unspecified combined systolic (congestive) and diastolic (congestive) heart failure: Secondary | ICD-10-CM | POA: Diagnosis not present

## 2024-02-15 DIAGNOSIS — S0990XA Unspecified injury of head, initial encounter: Secondary | ICD-10-CM | POA: Diagnosis not present

## 2024-02-15 DIAGNOSIS — E559 Vitamin D deficiency, unspecified: Secondary | ICD-10-CM | POA: Diagnosis not present

## 2024-02-15 DIAGNOSIS — C3491 Malignant neoplasm of unspecified part of right bronchus or lung: Secondary | ICD-10-CM | POA: Diagnosis not present

## 2024-02-15 DIAGNOSIS — I5020 Unspecified systolic (congestive) heart failure: Secondary | ICD-10-CM | POA: Diagnosis not present

## 2024-02-15 DIAGNOSIS — R112 Nausea with vomiting, unspecified: Secondary | ICD-10-CM | POA: Diagnosis not present

## 2024-02-15 DIAGNOSIS — J439 Emphysema, unspecified: Secondary | ICD-10-CM | POA: Diagnosis not present

## 2024-02-15 DIAGNOSIS — S51011A Laceration without foreign body of right elbow, initial encounter: Secondary | ICD-10-CM | POA: Diagnosis not present

## 2024-02-15 DIAGNOSIS — F331 Major depressive disorder, recurrent, moderate: Secondary | ICD-10-CM | POA: Diagnosis not present

## 2024-02-15 DIAGNOSIS — R918 Other nonspecific abnormal finding of lung field: Secondary | ICD-10-CM | POA: Diagnosis not present

## 2024-02-15 DIAGNOSIS — Z7982 Long term (current) use of aspirin: Secondary | ICD-10-CM | POA: Diagnosis not present

## 2024-02-15 DIAGNOSIS — G4709 Other insomnia: Secondary | ICD-10-CM | POA: Diagnosis not present

## 2024-02-15 DIAGNOSIS — H6121 Impacted cerumen, right ear: Secondary | ICD-10-CM | POA: Diagnosis not present

## 2024-02-15 DIAGNOSIS — J9809 Other diseases of bronchus, not elsewhere classified: Secondary | ICD-10-CM | POA: Diagnosis not present

## 2024-02-15 DIAGNOSIS — R4781 Slurred speech: Secondary | ICD-10-CM | POA: Diagnosis not present

## 2024-02-15 DIAGNOSIS — I130 Hypertensive heart and chronic kidney disease with heart failure and stage 1 through stage 4 chronic kidney disease, or unspecified chronic kidney disease: Secondary | ICD-10-CM | POA: Diagnosis not present

## 2024-02-15 DIAGNOSIS — K2100 Gastro-esophageal reflux disease with esophagitis, without bleeding: Secondary | ICD-10-CM | POA: Diagnosis not present

## 2024-02-15 DIAGNOSIS — I639 Cerebral infarction, unspecified: Secondary | ICD-10-CM | POA: Diagnosis not present

## 2024-02-15 DIAGNOSIS — N189 Chronic kidney disease, unspecified: Secondary | ICD-10-CM | POA: Diagnosis not present

## 2024-02-15 DIAGNOSIS — Z741 Need for assistance with personal care: Secondary | ICD-10-CM | POA: Diagnosis not present

## 2024-02-15 DIAGNOSIS — J440 Chronic obstructive pulmonary disease with acute lower respiratory infection: Secondary | ICD-10-CM | POA: Diagnosis not present

## 2024-02-15 DIAGNOSIS — I340 Nonrheumatic mitral (valve) insufficiency: Secondary | ICD-10-CM | POA: Diagnosis not present

## 2024-02-15 DIAGNOSIS — I959 Hypotension, unspecified: Secondary | ICD-10-CM | POA: Diagnosis not present

## 2024-02-15 DIAGNOSIS — Z794 Long term (current) use of insulin: Secondary | ICD-10-CM | POA: Diagnosis not present

## 2024-02-15 DIAGNOSIS — K5900 Constipation, unspecified: Secondary | ICD-10-CM | POA: Diagnosis not present

## 2024-02-15 DIAGNOSIS — R9089 Other abnormal findings on diagnostic imaging of central nervous system: Secondary | ICD-10-CM | POA: Diagnosis not present

## 2024-02-15 DIAGNOSIS — R110 Nausea: Secondary | ICD-10-CM | POA: Diagnosis not present

## 2024-02-15 DIAGNOSIS — Z743 Need for continuous supervision: Secondary | ICD-10-CM | POA: Diagnosis not present

## 2024-02-15 DIAGNOSIS — S022XXA Fracture of nasal bones, initial encounter for closed fracture: Secondary | ICD-10-CM | POA: Diagnosis not present

## 2024-02-15 DIAGNOSIS — F17210 Nicotine dependence, cigarettes, uncomplicated: Secondary | ICD-10-CM | POA: Diagnosis not present

## 2024-02-15 DIAGNOSIS — E119 Type 2 diabetes mellitus without complications: Secondary | ICD-10-CM | POA: Diagnosis not present

## 2024-02-15 DIAGNOSIS — Z7902 Long term (current) use of antithrombotics/antiplatelets: Secondary | ICD-10-CM | POA: Diagnosis not present

## 2024-02-15 DIAGNOSIS — Q2547 Right aortic arch: Secondary | ICD-10-CM | POA: Diagnosis not present

## 2024-02-15 DIAGNOSIS — R29703 NIHSS score 3: Secondary | ICD-10-CM | POA: Diagnosis not present

## 2024-02-15 DIAGNOSIS — Z87898 Personal history of other specified conditions: Secondary | ICD-10-CM | POA: Diagnosis not present

## 2024-02-15 DIAGNOSIS — R609 Edema, unspecified: Secondary | ICD-10-CM | POA: Diagnosis not present

## 2024-02-15 DIAGNOSIS — D696 Thrombocytopenia, unspecified: Secondary | ICD-10-CM | POA: Diagnosis not present

## 2024-02-15 DIAGNOSIS — J449 Chronic obstructive pulmonary disease, unspecified: Secondary | ICD-10-CM | POA: Diagnosis not present

## 2024-02-15 DIAGNOSIS — R2990 Unspecified symptoms and signs involving the nervous system: Secondary | ICD-10-CM | POA: Diagnosis not present

## 2024-02-15 DIAGNOSIS — I672 Cerebral atherosclerosis: Secondary | ICD-10-CM | POA: Diagnosis not present

## 2024-02-15 DIAGNOSIS — I5022 Chronic systolic (congestive) heart failure: Secondary | ICD-10-CM | POA: Diagnosis not present

## 2024-02-15 DIAGNOSIS — R296 Repeated falls: Secondary | ICD-10-CM | POA: Diagnosis not present

## 2024-02-15 DIAGNOSIS — W19XXXA Unspecified fall, initial encounter: Secondary | ICD-10-CM | POA: Diagnosis not present

## 2024-02-15 DIAGNOSIS — N1831 Chronic kidney disease, stage 3a: Secondary | ICD-10-CM | POA: Diagnosis not present

## 2024-02-15 DIAGNOSIS — F0390 Unspecified dementia without behavioral disturbance: Secondary | ICD-10-CM | POA: Diagnosis not present

## 2024-02-15 DIAGNOSIS — J069 Acute upper respiratory infection, unspecified: Secondary | ICD-10-CM | POA: Diagnosis not present

## 2024-02-15 DIAGNOSIS — Z043 Encounter for examination and observation following other accident: Secondary | ICD-10-CM | POA: Diagnosis not present

## 2024-02-15 DIAGNOSIS — W01198A Fall on same level from slipping, tripping and stumbling with subsequent striking against other object, initial encounter: Secondary | ICD-10-CM | POA: Diagnosis not present

## 2024-02-15 DIAGNOSIS — J99 Respiratory disorders in diseases classified elsewhere: Secondary | ICD-10-CM | POA: Diagnosis not present

## 2024-02-15 DIAGNOSIS — R29898 Other symptoms and signs involving the musculoskeletal system: Secondary | ICD-10-CM | POA: Diagnosis not present

## 2024-02-15 DIAGNOSIS — R41841 Cognitive communication deficit: Secondary | ICD-10-CM | POA: Diagnosis not present

## 2024-02-15 DIAGNOSIS — J22 Unspecified acute lower respiratory infection: Secondary | ICD-10-CM | POA: Diagnosis not present

## 2024-02-15 DIAGNOSIS — S199XXA Unspecified injury of neck, initial encounter: Secondary | ICD-10-CM | POA: Diagnosis not present

## 2024-02-15 DIAGNOSIS — S022XXD Fracture of nasal bones, subsequent encounter for fracture with routine healing: Secondary | ICD-10-CM | POA: Diagnosis not present

## 2024-02-15 DIAGNOSIS — S61211A Laceration without foreign body of left index finger without damage to nail, initial encounter: Secondary | ICD-10-CM | POA: Diagnosis not present

## 2024-02-15 DIAGNOSIS — Y92129 Unspecified place in nursing home as the place of occurrence of the external cause: Secondary | ICD-10-CM | POA: Diagnosis not present

## 2024-02-15 DIAGNOSIS — F17291 Nicotine dependence, other tobacco product, in remission: Secondary | ICD-10-CM | POA: Diagnosis not present

## 2024-02-15 DIAGNOSIS — Z8542 Personal history of malignant neoplasm of other parts of uterus: Secondary | ICD-10-CM | POA: Diagnosis not present

## 2024-02-15 DIAGNOSIS — R531 Weakness: Secondary | ICD-10-CM | POA: Diagnosis not present

## 2024-02-15 DIAGNOSIS — Z79899 Other long term (current) drug therapy: Secondary | ICD-10-CM | POA: Diagnosis not present

## 2024-02-15 DIAGNOSIS — I2510 Atherosclerotic heart disease of native coronary artery without angina pectoris: Secondary | ICD-10-CM | POA: Diagnosis not present

## 2024-02-15 DIAGNOSIS — M6259 Muscle wasting and atrophy, not elsewhere classified, multiple sites: Secondary | ICD-10-CM | POA: Diagnosis not present

## 2024-02-15 DIAGNOSIS — I509 Heart failure, unspecified: Secondary | ICD-10-CM | POA: Diagnosis not present

## 2024-02-15 DIAGNOSIS — Z7401 Bed confinement status: Secondary | ICD-10-CM | POA: Diagnosis not present

## 2024-02-15 DIAGNOSIS — M4802 Spinal stenosis, cervical region: Secondary | ICD-10-CM | POA: Diagnosis not present

## 2024-02-15 DIAGNOSIS — R9431 Abnormal electrocardiogram [ECG] [EKG]: Secondary | ICD-10-CM | POA: Diagnosis not present

## 2024-02-15 DIAGNOSIS — Z85118 Personal history of other malignant neoplasm of bronchus and lung: Secondary | ICD-10-CM | POA: Diagnosis not present

## 2024-02-15 DIAGNOSIS — R9082 White matter disease, unspecified: Secondary | ICD-10-CM | POA: Diagnosis not present

## 2024-02-15 DIAGNOSIS — R279 Unspecified lack of coordination: Secondary | ICD-10-CM | POA: Diagnosis not present

## 2024-02-15 DIAGNOSIS — I6523 Occlusion and stenosis of bilateral carotid arteries: Secondary | ICD-10-CM | POA: Diagnosis not present

## 2024-02-15 DIAGNOSIS — B953 Streptococcus pneumoniae as the cause of diseases classified elsewhere: Secondary | ICD-10-CM | POA: Diagnosis not present

## 2024-02-15 DIAGNOSIS — E1122 Type 2 diabetes mellitus with diabetic chronic kidney disease: Secondary | ICD-10-CM | POA: Diagnosis not present

## 2024-02-15 DIAGNOSIS — Z7901 Long term (current) use of anticoagulants: Secondary | ICD-10-CM | POA: Diagnosis not present

## 2024-02-15 DIAGNOSIS — R7881 Bacteremia: Secondary | ICD-10-CM | POA: Diagnosis not present

## 2024-02-15 DIAGNOSIS — I10 Essential (primary) hypertension: Secondary | ICD-10-CM | POA: Diagnosis not present

## 2024-02-15 DIAGNOSIS — J9601 Acute respiratory failure with hypoxia: Secondary | ICD-10-CM | POA: Diagnosis not present

## 2024-02-15 DIAGNOSIS — F5101 Primary insomnia: Secondary | ICD-10-CM | POA: Diagnosis not present

## 2024-02-15 DIAGNOSIS — Q791 Other congenital malformations of diaphragm: Secondary | ICD-10-CM | POA: Diagnosis not present

## 2024-02-15 DIAGNOSIS — J441 Chronic obstructive pulmonary disease with (acute) exacerbation: Secondary | ICD-10-CM | POA: Diagnosis not present

## 2024-02-15 DIAGNOSIS — R1312 Dysphagia, oropharyngeal phase: Secondary | ICD-10-CM | POA: Diagnosis not present

## 2024-02-15 DIAGNOSIS — S61212A Laceration without foreign body of right middle finger without damage to nail, initial encounter: Secondary | ICD-10-CM | POA: Diagnosis not present

## 2024-02-15 DIAGNOSIS — J189 Pneumonia, unspecified organism: Secondary | ICD-10-CM | POA: Diagnosis not present

## 2024-02-15 DIAGNOSIS — S61512A Laceration without foreign body of left wrist, initial encounter: Secondary | ICD-10-CM | POA: Diagnosis not present

## 2024-02-15 DIAGNOSIS — R29818 Other symptoms and signs involving the nervous system: Secondary | ICD-10-CM | POA: Diagnosis present

## 2024-02-15 DIAGNOSIS — F32A Depression, unspecified: Secondary | ICD-10-CM | POA: Diagnosis not present

## 2024-02-15 DIAGNOSIS — E785 Hyperlipidemia, unspecified: Secondary | ICD-10-CM | POA: Diagnosis not present

## 2024-02-15 DIAGNOSIS — B370 Candidal stomatitis: Secondary | ICD-10-CM | POA: Diagnosis not present

## 2024-02-15 DIAGNOSIS — N183 Chronic kidney disease, stage 3 unspecified: Secondary | ICD-10-CM | POA: Diagnosis not present

## 2024-02-15 DIAGNOSIS — M47812 Spondylosis without myelopathy or radiculopathy, cervical region: Secondary | ICD-10-CM | POA: Diagnosis not present

## 2024-02-15 DIAGNOSIS — S0993XA Unspecified injury of face, initial encounter: Secondary | ICD-10-CM | POA: Diagnosis not present

## 2024-02-15 LAB — GLUCOSE, CAPILLARY
Glucose-Capillary: 109 mg/dL — ABNORMAL HIGH (ref 70–99)
Glucose-Capillary: 196 mg/dL — ABNORMAL HIGH (ref 70–99)

## 2024-02-15 MED ORDER — GUAIFENESIN ER 600 MG PO TB12: 600.0000 mg | ORAL_TABLET | Freq: Two times a day (BID) | ORAL | Status: DC | PRN

## 2024-02-15 MED ORDER — INSULIN ASPART 100 UNIT/ML IJ SOLN: 0.0000 [IU] | Freq: Three times a day (TID) | INTRAMUSCULAR | Status: DC

## 2024-02-15 MED ORDER — INCRUSE ELLIPTA 62.5 MCG/ACT IN AEPB: 1.0000 | INHALATION_SPRAY | Freq: Every day | RESPIRATORY_TRACT | Status: DC

## 2024-02-15 MED ORDER — BISACODYL 10 MG RE SUPP: 10.0000 mg | Freq: Every day | RECTAL | Status: DC | PRN

## 2024-02-15 MED ORDER — FLUCONAZOLE 100 MG PO TABS: 100.0000 mg | ORAL_TABLET | Freq: Every day | ORAL | Status: AC

## 2024-02-15 MED ORDER — POLYETHYLENE GLYCOL 3350 17 G PO PACK: 17.0000 g | PACK | Freq: Every day | ORAL | Status: DC

## 2024-02-15 MED ORDER — SENNOSIDES-DOCUSATE SODIUM 8.6-50 MG PO TABS: 1.0000 | ORAL_TABLET | Freq: Two times a day (BID) | ORAL | Status: DC

## 2024-02-15 MED ORDER — PREDNISONE 10 MG PO TABS: 10.0000 mg | ORAL_TABLET | Freq: Every day | ORAL | Status: AC

## 2024-02-15 MED ORDER — MELATONIN 5 MG PO TABS: 5.0000 mg | ORAL_TABLET | Freq: Every day | ORAL | Status: DC

## 2024-02-15 MED ORDER — MOMETASONE FURO-FORMOTEROL FUM 100-5 MCG/ACT IN AERO: 2.0000 | INHALATION_SPRAY | Freq: Two times a day (BID) | RESPIRATORY_TRACT | Status: DC

## 2024-02-15 NOTE — Discharge Summary (Signed)
 Physician Discharge Summary   Patient: Sarah White MRN: [id number] DOB: 1945-09-12  Admit date:     02/04/2024  Discharge date: 02/15/24  Discharge Physician: Montey Apa   PCP: Solomon Dupre, DO   Recommendations at discharge:   Follow up with Pulmonology Follow up with Primary Care Repeat CBC, CMP, Mg in 1-2 weeks Follow up on oxygen requirements.  At time of discharge, pt is requiring 2 L/min oxygen by nasal cannula.  This is a new oxygen requirement in the setting of pneumonia.  Discharge Diagnoses: Principal Problem:   Bacteremia due to Streptococcus pneumoniae Active Problems:   Multifocal pneumonia   COPD with acute exacerbation (HCC)   Acute hypoxic respiratory failure (HCC)   Ground-level fall   Non-small cell lung cancer (HCC)   Elevated bilirubin   HFrEF (heart failure with reduced ejection fraction) (HCC)   Thrush   Hypertension   Type 2 diabetes mellitus (HCC)   CKD (chronic kidney disease), stage III (HCC)   Thrombocytopenia (HCC)   Malignant neoplasm of right lung (HCC)   Hypokalemia   Constipation  Resolved Problems:   * No resolved hospital problems. *  Hospital Course:  HPI on admission: 78 y.o. female with medical history significant of Non-small cell lung cancer s/p SBRT, COPD, HFrEF with last EF 40%, mitral regurgitation, hypertension, uterine carcinoma s/p hysterectomy, diet-controlled type 2 diabetes, who presents to the ED due to shortness of breath.   [name] states that approximately 1 week ago, she was walking in her home when she had a ground-level fall where she landed on her right side.  Her son was able to help her up, however the next day she fell again landing on the same side.  Since then, she has been experiencing severe right rib pain and right flank pain.  1 day after the second fall, she began to experience a productive cough with shortness of breath.  She endorses fever, poor appetite but denies any nausea, vomiting.  She  denies any diarrhea.   ED course: On arrival to the ED, patient was normotensive at 139/83 with heart rate of 82.  She was saturating at 93% on 3 L.  She was afebrile at 98.5.  Initial workup notable for sodium 132, bicarb 23, BUN 27, creat 1.03, total bilirubin 2.9 and GFR 57. CBC still pending. COVID-19, influenza and RSV PCR negative. Chest xray with dense consolidation of the right lower lobe. Patient started on Azithromycin  and Rocephin , as well as Solumedrol and Duonebs. [facility] contacted for admission.    Patient was admitted on 02/04/2024 for further evaluation and management of acute respiratory failure with hypoxia.  Subsequently found to have Streptococcus pneumonia and bacteremia as outlined in detail below.  Hospital course further complicated and prolonged by acute on chronic HFrEF requiring several days of IV diuresis.  Oxygen requirements improved to 2 L/min nasal cannula.   Further hospital course and management as outlined below.   6/17 -- pt continues to improve, clinically doing well and is without acute complaints. Patient is medically stable for discharge and accepted to SNF for short-term rehab today.    Assessment and Plan:  * Bacteremia and Multifocal pneumonia due to Streptococcus pneumoniae Streptococcus pneumoniae sensitive to Rocephin .   --Completed course of Rocephin  --Repeat blood cultures negative --Pulmonology was consulted, signed off --Follow up with Pulmonology is scheduled on July 9th with Dr. Viva Grise --Transitioned IV Solu-medrol  >> tapering Prednisone  --Prednisone  10 mg daily (2 days remaining at time of discharge) --  Incentive spirometer --OOB to chair, mobilize --Treated with Pulmicort  nebs BID, Brovana neb BID, Yupelri neb daily --Mucinex  BID, PRN Tussionex    Acute respiratory failure with hypoxia - due to PNA, COPD, CHF decompensation, pleural effusion, hx lung cancer.   No O2 requirement at baseline.  ER physician documented pulse ox of 75% on  room air.   6/12 -- Increased O2 needs from 5 >> 8 L/min HFNC.   6/13 -- weaned to 4 L/min O2 6/14 - on 3-4 L 6/15--17 - on 2 L --Mgmt of PNA, CHF and COPD as outlined --Supplement O2 to maintain spO2 > 88%, wean as tolerated --Incentive spirometer --Follow up with Pulmonology  COPD with acute exacerbation  --Mgmt as outlined  --Start maintenance regimen with Dulera  and Incruse --PRN albuterol  --Mucinex  PRN --Follow up with Pulmonology   Acute on Chronic HFrEF  ECHO -- EF 35-40% 6/11 - increasing O2 needs.  CXR with signs of volume overload.  Lasix  started with good response.  Started on diuresis with IV Lasix . 6/16 - renal function stable, pt beginning to appear clinically dry 6/17 - transitioned to oral Lasix  --Diuresed with IV Lasix  40 mg BID from 6/12--6/12 --Transitioned to home PO Lasix  40 mg daily this AM --Daily weights to monitor volume status --Monitor renal function and electrolytes - repeat labs in 1-2 weeks   Ground-level fall --Continue PT/OT at SNF --Fall precautions   Non-small cell lung cancer (HCC) History of non-small cell lung cancer diagnosed earlier this year with screening CT.  She is not a candidate for bronchoscopy for tissue sampling or surgical intervention.  She has completed SBRT with her last session on April 30. --Follow up with Oncology and Pulmonology   Elevated bilirubin Elevated total bilirubin on admission - resolved.   Thrush Continue Diflucan  - 6 more days at time of d/c   Constipation Bowel regimen per orders   Hypokalemia Replaced & resolved   Thrombocytopenia (HCC) Monitor CBC   CKD (chronic kidney disease), stage III (HCC) Per chart review, patient has a history of CKD stage IIIa with baseline creatinine ranging between 1.1 and 1.3, currently 1.0.   Creatinine 0.78 >> 0.88 stable. --Monitor BMP   Type 2 diabetes mellitus (HCC) History of diet-controlled type 2 diabetes with last A1c of 6.5% Elevated postprandial sugars  noted, >300 yesterday at dinner --Started sliding scale Novolog  TID WC for post-prandial steroid-induced hyperglycemia --Re-assess need for insulin  once off steroids --PCP follow up in 1-2 weeks   Hypertension Continue Coreg , Lasix        Consultants: Pulmonology Procedures performed: None  Disposition: Skilled nursing facility Diet recommendation:  Discharge Diet Orders (From admission, onward)     Start     Ordered   02/15/24 0000  Diet - low sodium heart healthy        02/15/24 0840           Cardiac diet DISCHARGE MEDICATION: Allergies as of 02/15/2024       Reactions   Amlodipine  Swelling   Atorvastatin  Other (See Comments)   Myalgia    Codeine Nausea And Vomiting   Hydrochlorothiazide  Other (See Comments)   Causes Low Potassium   Lisinopril  Other (See Comments)   Causes dizziness   Metformin  And Related Nausea Only, Other (See Comments)   Causes dizziness        Medication List     TAKE these medications    albuterol  108 (90 Base) MCG/ACT inhaler Commonly known as: VENTOLIN  HFA Inhale 2 puffs into the  lungs every 6 (six) hours as needed for wheezing or shortness of breath.   bisacodyl  10 MG suppository Commonly known as: DULCOLAX Place 1 suppository (10 mg total) rectally daily as needed (no BM in over 2 days).   buPROPion  300 MG 24 hr tablet Commonly known as: WELLBUTRIN  XL Take 300 mg by mouth daily.   carvedilol  12.5 MG tablet Commonly known as: COREG  Take 12.5 mg by mouth 2 (two) times daily.   clopidogrel  75 MG tablet Commonly known as: PLAVIX  Take 75 mg by mouth every morning.   fluconazole  100 MG tablet Commonly known as: DIFLUCAN  Take 1 tablet (100 mg total) by mouth daily for 6 days. Start taking on: February 16, 2024   furosemide  40 MG tablet Commonly known as: LASIX  Take 40 mg by mouth daily.   guaiFENesin  600 MG 12 hr tablet Commonly known as: MUCINEX  Take 1 tablet (600 mg total) by mouth 2 (two) times daily as needed for  cough or to loosen phlegm.   Incruse Ellipta 62.5 MCG/ACT Aepb Generic drug: umeclidinium bromide Inhale 1 puff into the lungs daily.   insulin  aspart 100 UNIT/ML injection Commonly known as: novoLOG  Inject 0-9 Units into the skin 3 (three) times daily with meals.   melatonin 5 MG Tabs Take 1 tablet (5 mg total) by mouth at bedtime.   mometasone -formoterol  100-5 MCG/ACT Aero Commonly known as: DULERA  Inhale 2 puffs into the lungs 2 (two) times daily.   nitroGLYCERIN  0.4 MG SL tablet Commonly known as: NITROSTAT  Place 0.4 mg under the tongue every 5 (five) minutes as needed for chest pain.   ondansetron  8 MG tablet Commonly known as: ZOFRAN  Take 8 mg by mouth every 8 (eight) hours as needed.   pantoprazole  40 MG tablet Commonly known as: PROTONIX  Take 40 mg by mouth 2 (two) times daily.   polyethylene glycol 17 g packet Commonly known as: MIRALAX  / GLYCOLAX  Take 17 g by mouth daily. Hold for loose or frequent stools.   predniSONE  10 MG tablet Commonly known as: DELTASONE  Take 1 tablet (10 mg total) by mouth daily with breakfast for 2 days. Start taking on: February 16, 2024   QUEtiapine  50 MG Tb24 24 hr tablet Commonly known as: SEROQUEL  XR Take 100 mg by mouth at bedtime.   rosuvastatin  20 MG tablet Commonly known as: CRESTOR  Take 20 mg by mouth daily.   senna-docusate 8.6-50 MG tablet Commonly known as: Senokot-S Take 1 tablet by mouth 2 (two) times daily. Hold for loose or frequent stools.   sertraline  100 MG tablet Commonly known as: ZOLOFT  Take 200 mg by mouth daily.   traZODone  50 MG tablet Commonly known as: DESYREL  Take 50 mg by mouth at bedtime as needed.        Contact information for after-discharge care     Destination     [facility] [place], [place]. Aaron Aas   Service: Skilled Nursing Contact information: [address] [place]  [address] [phone]                    Discharge Exam: Filed Weights   02/04/24 1408  02/13/24 0500 02/14/24 0601  Weight: 78.9 kg 75 kg 75 kg   General exam: awake, alert, no acute distress HEENT: edentulous, moist mucus membranes, hearing grossly normal  Respiratory system: CTAB diminished bases, no wheezes or rhonchi, normal respiratory effort at rest, on 2 L/min [place] O2. Cardiovascular system: normal S1/S2, RRR, no pedal edema.   Gastrointestinal system: soft, NT, ND, bowel  sounds present Central nervous system: A&O x2+. no gross focal neurologic deficits, normal speech Extremities: moves all, no edema, normal tone Skin: dry, intact, normal temperature Psychiatry: normal mood, congruent affect   Condition at discharge: stable  The results of significant diagnostics from this hospitalization (including imaging, microbiology, ancillary and laboratory) are listed below for reference.   Imaging Studies: ECHOCARDIOGRAM COMPLETE Result Date: 02/10/2024    ECHOCARDIOGRAM REPORT   Patient Name:   STORMEE DUDA Date of Exam: 02/10/2024 Medical Rec #:  [id number]    Height:       64.0 in Accession #:    [id number]   Weight:       174.0 lb Date of Birth:  11/20/1945    BSA:          1.844 m Patient Age:    77 years     BP:           138/60 mmHg Patient Gender: F            HR:           76 bpm. Exam Location:  [facility] Procedure: 2D Echo, Cardiac Doppler and Color Doppler (Both Spectral and Color            Flow Doppler were utilized during procedure). Indications:    Other abnormalities of the heart R00.8  History:        Patient has no prior history of Echocardiogram examinations.                 COPD.  Sonographer:    Broadus Canes Referring Phys: [id number] Johnnette Laux A Arnie Clingenpeel Diagnosing      Antionette Kirks MD Phys:  Sonographer Comments: Technically challenging study due to limited acoustic windows and no parasternal window. Image acquisition challenging due to COPD. IMPRESSIONS  1. Left ventricular ejection fraction, by estimation, is 35 to 40%. The left ventricle has moderately decreased function.  Left ventricular endocardial border not optimally defined to evaluate regional wall motion. The left ventricular internal cavity size was mildly dilated. Left ventricular diastolic function could not be evaluated.  2. Right ventricular systolic function is normal. The right ventricular size is normal.  3. The mitral valve is normal in structure. No evidence of mitral valve regurgitation. No evidence of mitral stenosis.  4. The aortic valve is normal in structure. Aortic valve regurgitation is not visualized. Aortic valve sclerosis/calcification is present, without any evidence of aortic stenosis.  5. The inferior vena cava is normal in size with greater than 50% respiratory variability, suggesting right atrial pressure of 3 mmHg. FINDINGS  Left Ventricle: Left ventricular ejection fraction, by estimation, is 35 to 40%. The left ventricle has moderately decreased function. Left ventricular endocardial border not optimally defined to evaluate regional wall motion. The left ventricular internal cavity size was mildly dilated. There is no left ventricular hypertrophy. Left ventricular diastolic function could not be evaluated. Right Ventricle: The right ventricular size is normal. No increase in right ventricular wall thickness. Right ventricular systolic function is normal. Left Atrium: Left atrial size was normal in size. Right Atrium: Right atrial size was normal in size. Pericardium: There is no evidence of pericardial effusion. Mitral Valve: The mitral valve is normal in structure. There is mild thickening of the mitral valve leaflet(s). No evidence of mitral valve regurgitation. No evidence of mitral valve stenosis. Tricuspid Valve: The tricuspid valve is normal in structure. Tricuspid valve regurgitation is not demonstrated. No evidence of tricuspid stenosis. Aortic Valve: The aortic valve  is normal in structure. Aortic valve regurgitation is not visualized. Aortic valve sclerosis/calcification is present,  without any evidence of aortic stenosis. Pulmonic Valve: The pulmonic valve was normal in structure. Pulmonic valve regurgitation is not visualized. No evidence of pulmonic stenosis. Aorta: The aortic root is normal in size and structure. Venous: The inferior vena cava is normal in size with greater than 50% respiratory variability, suggesting right atrial pressure of 3 mmHg. IAS/Shunts: No atrial level shunt detected by color flow Doppler.  LEFT VENTRICLE PLAX 2D LVIDd:         5.40 cm LVIDs:         4.60 cm LV PW:         0.80 cm LV IVS:        0.90 cm LVOT diam:     2.30 cm LVOT Area:     4.15 cm  LV Volumes (MOD) LV vol d, MOD A2C: 134.0 ml LV vol s, MOD A2C: 70.5 ml LV SV MOD A2C:     63.5 ml LEFT ATRIUM           Index LA diam:      3.20 cm 1.74 cm/m LA Vol (A2C): 30.1 ml 16.32 ml/m   AORTA Ao Root diam: 2.90 cm  SHUNTS Systemic Diam: 2.30 cm Antionette Kirks MD Electronically signed by Antionette Kirks MD Signature Date/Time: 02/10/2024/11:31:32 AM    Final    DG Chest Port 1 View Result Date: 02/09/2024 CLINICAL DATA:  Acute respiratory failure EXAM: PORTABLE CHEST 1 VIEW COMPARISON:  X-ray 02/04/2024 and CT scan without contrast FINDINGS: Hyperinflation. Small bilateral pleural effusions are seen, right greater left. The right is similar to previous. The left is new. Adjacent lung opacities. Stable cardiopericardial silhouette. Tortuous ectatic aorta. Vascular congestion has developed, new from previous. Kyphotic x-ray obscures the apices. IMPRESSION: Increasing vascular congestion. Persistent right-sided pleural effusion and new small left PE adjacent lung opacities. Recommend follow-up. Electronically Signed   By: Adrianna Horde M.D.   On: 02/09/2024 12:06   CT CHEST WO CONTRAST Result Date: 02/04/2024 CLINICAL DATA:  History of lung cancer with several days of worsening shortness of breath associated with productive cough. * Tracking Code: BO * EXAM: CT CHEST WITHOUT CONTRAST TECHNIQUE: Multidetector CT  imaging of the chest was performed following the standard protocol without IV contrast. RADIATION DOSE REDUCTION: This exam was performed according to the departmental dose-optimization program which includes automated exposure control, adjustment of the mA and/or kV according to patient size and/or use of iterative reconstruction technique. COMPARISON:  CT chest dated 09/15/2023, nuclear medicine PET dated 09/29/2023 FINDINGS: Cardiovascular: Normal heart size. No significant pericardial fluid/thickening. Great vessels are normal in course and caliber. Coronary artery calcifications and aortic atherosclerosis. Mediastinum/Nodes: Imaged thyroid  gland without nodules meeting criteria for imaging follow-up by size. Normal esophagus. Increased size of a 10 mm subcarinal lymph node (2:72), previously 3 mm. Lungs/Pleura: Asymmetric volume loss of the right lung. The central airways are patent. Layering secretions within the trachea. Moderate diffuse bronchial wall thickening. Upper lobe predominant moderate centrilobular and paraseptal emphysema. Interval decreased size of the spiculated medial right upper lobe nodule measuring 1.5 x 1.5 cm (3:51), previously 2.2 x 1.6 cm. There is new irregular consolidation and patchy ground-glass opacities within the dependent right upper lobe, middle lobe, and right lower lobe with air bronchograms. No pneumothorax. Small right pleural effusion. Upper abdomen: Partially imaged splenomegaly measures 13.4 cm in cranial caudal dimension, previously 15.7 cm. Musculoskeletal: No acute or abnormal lytic  or blastic osseous lesions. Multilevel degenerative changes of the thoracic spine. Old sternotomy fracture. Unchanged partially imaged compression deformity of L1 and superior endplate compression of T3. IMPRESSION: 1. New irregular consolidation and patchy ground-glass opacities within the dependent right upper lobe, middle lobe, and right lower lobe with air bronchograms, likely  multifocal pneumonia. Possible underlying posttreatment change in the setting of radiation treatment. 2. Interval decreased size of the spiculated medial right upper lobe nodule, consistent with treatment response. 3. New subcarinal lymphadenopathy, indeterminate. 4. Small right pleural effusion. 5. Partially imaged splenomegaly measures 13.4 cm in cranial caudal dimension, previously 15.7 cm. 6. Aortic Atherosclerosis (ICD10-I70.0). Coronary artery calcifications. Assessment for potential risk factor modification, dietary therapy or pharmacologic therapy may be warranted, if clinically indicated. Electronically Signed   By: Limin  Xu M.D.   On: 02/04/2024 17:01   DG Chest Port 1 View Result Date: 02/04/2024 CLINICAL DATA:  Dyspnea. History of COPD and lung cancer. Clinical concern for possible sepsis. Right-sided pain. Marvell Slider twice this month. EXAM: PORTABLE CHEST 1 VIEW COMPARISON:  None Available. FINDINGS: Dense and patchy airspace opacity at the right lung base. No visible rib fracture or pneumothorax. Clear left lung. Normal-sized heart. Thoracic spine degenerative changes. IMPRESSION: Dense right basilar pneumonia. Electronically Signed   By: Catherin Closs M.D.   On: 02/04/2024 14:35    Microbiology: Results for orders placed or performed during the hospital encounter of 02/04/24  Resp panel by RT-PCR (RSV, Flu A&B, Covid) Anterior Nasal Swab     Status: None   Collection Time: 02/04/24  2:06 PM   Specimen: Anterior Nasal Swab  Result Value Ref Range Status   SARS Coronavirus 2 by RT PCR NEGATIVE NEGATIVE Final    Comment: (NOTE) SARS-CoV-2 target nucleic acids are NOT DETECTED.  The SARS-CoV-2 RNA is generally detectable in upper respiratory specimens during the acute phase of infection. The lowest concentration of SARS-CoV-2 viral copies this assay can detect is 138 copies/mL. A negative result does not preclude SARS-Cov-2 infection and should not be used as the sole basis for treatment  or other patient management decisions. A negative result may occur with  improper specimen collection/handling, submission of specimen other than nasopharyngeal swab, presence of viral mutation(s) within the areas targeted by this assay, and inadequate number of viral copies(<138 copies/mL). A negative result must be combined with clinical observations, patient history, and epidemiological information. The expected result is Negative.  Fact Sheet for Patients:  BloggerCourse.com  Fact Sheet for Healthcare Providers:  SeriousBroker.it  This test is no t yet approved or cleared by the [place]  FDA and  has been authorized for detection and/or diagnosis of SARS-CoV-2 by FDA under an Emergency Use Authorization (EUA). This EUA will remain  in effect (meaning this test can be used) for the duration of the COVID-19 declaration under Section 564(b)(1) of the Act, 21 U.S.C.section 360bbb-3(b)(1), unless the authorization is terminated  or revoked sooner.       Influenza A by PCR NEGATIVE NEGATIVE Final   Influenza B by PCR NEGATIVE NEGATIVE Final    Comment: (NOTE) The Xpert Xpress SARS-CoV-2/FLU/RSV plus assay is intended as an aid in the diagnosis of influenza from Nasopharyngeal swab specimens and should not be used as a sole basis for treatment. Nasal washings and aspirates are unacceptable for Xpert Xpress SARS-CoV-2/FLU/RSV testing.  Fact Sheet for Patients: BloggerCourse.com  Fact Sheet for Healthcare Providers: SeriousBroker.it  This test is not yet approved or cleared by the [place]  FDA and has  been authorized for detection and/or diagnosis of SARS-CoV-2 by FDA under an Emergency Use Authorization (EUA). This EUA will remain in effect (meaning this test can be used) for the duration of the COVID-19 declaration under Section 564(b)(1) of the Act, 21 U.S.C. section  360bbb-3(b)(1), unless the authorization is terminated or revoked.     Resp Syncytial Virus by PCR NEGATIVE NEGATIVE Final    Comment: (NOTE) Fact Sheet for Patients: BloggerCourse.com  Fact Sheet for Healthcare Providers: SeriousBroker.it  This test is not yet approved or cleared by the [place]  FDA and has been authorized for detection and/or diagnosis of SARS-CoV-2 by FDA under an Emergency Use Authorization (EUA). This EUA will remain in effect (meaning this test can be used) for the duration of the COVID-19 declaration under Section 564(b)(1) of the Act, 21 U.S.C. section 360bbb-3(b)(1), unless the authorization is terminated or revoked.  Performed at [facility], [address] [place], [place] [address]   Blood Culture (routine x 2)     Status: None   Collection Time: 02/04/24  2:06 PM   Specimen: BLOOD  Result Value Ref Range Status   Specimen Description BLOOD BLOOD RIGHT FOREARM  Final   Special Requests   Final    BOTTLES DRAWN AEROBIC AND ANAEROBIC Blood Culture results may not be optimal due to an inadequate volume of blood received in culture bottles   Culture   Final    NO GROWTH 5 DAYS Performed at [facility], [address], [place], [place] [address]    Report Status 02/09/2024 FINAL  Final  Blood Culture (routine x 2)     Status: Abnormal   Collection Time: 02/04/24  2:11 PM   Specimen: BLOOD  Result Value Ref Range Status   Specimen Description   Final    BLOOD BLOOD LEFT FOREARM Performed at [facility], [address] [place], [place] [address]    Special Requests   Final    BOTTLES DRAWN AEROBIC AND ANAEROBIC Blood Culture results may not be optimal due to an inadequate volume of blood received in culture bottles Performed at [facility], [address] [place], [place] [address]    Culture  Setup Time   Final    IN BOTH AEROBIC AND ANAEROBIC  BOTTLES GRAM POSITIVE COCCI CRITICAL RESULT CALLED TO, READ BACK BY AND VERIFIED WITH: JASON ROBBINS @ 02/05/24 0333 AB Performed at [facility], [address] [place], [place] [address]    Culture STREPTOCOCCUS PNEUMONIAE (A)  Final   Report Status 02/07/2024 FINAL  Final   Organism ID, Bacteria STREPTOCOCCUS PNEUMONIAE  Final      Susceptibility   Streptococcus pneumoniae - MIC*    ERYTHROMYCIN 4 RESISTANT Resistant     LEVOFLOXACIN  0.5 SENSITIVE Sensitive     VANCOMYCIN  0.5 SENSITIVE Sensitive     PENICILLIN (meningitis) 0.25 RESISTANT Resistant     PENO - penicillin 0.25      PENICILLIN (non-meningitis) 0.25 SENSITIVE Sensitive     PENICILLIN (oral) 0.25 INTERMEDIATE Intermediate     CEFTRIAXONE  (non-meningitis) <=0.12 SENSITIVE Sensitive     CEFTRIAXONE  (meningitis) <=0.12 SENSITIVE Sensitive     * STREPTOCOCCUS PNEUMONIAE  Blood Culture ID Panel (Reflexed)     Status: Abnormal   Collection Time: 02/04/24  2:11 PM  Result Value Ref Range Status   Enterococcus faecalis NOT DETECTED NOT DETECTED Final   Enterococcus Faecium NOT DETECTED NOT DETECTED Final   Listeria monocytogenes NOT DETECTED NOT DETECTED Final  Staphylococcus species NOT DETECTED NOT DETECTED Final   Staphylococcus aureus (BCID) NOT DETECTED NOT DETECTED Final   Staphylococcus epidermidis NOT DETECTED NOT DETECTED Final   Staphylococcus lugdunensis NOT DETECTED NOT DETECTED Final   Streptococcus species DETECTED (A) NOT DETECTED Final    Comment: CRITICAL RESULT CALLED TO, READ BACK BY AND VERIFIED WITH: JASON ROBBINS @ 02/05/24 0333 AB    Streptococcus agalactiae NOT DETECTED NOT DETECTED Final   Streptococcus pneumoniae DETECTED (A) NOT DETECTED Final    Comment: CRITICAL RESULT CALLED TO, READ BACK BY AND VERIFIED WITH: JASON ROBBINS @ 02/05/24 0333 AB    Streptococcus pyogenes NOT DETECTED NOT DETECTED Final   A.calcoaceticus-baumannii NOT DETECTED NOT DETECTED Final   Bacteroides fragilis  NOT DETECTED NOT DETECTED Final   Enterobacterales NOT DETECTED NOT DETECTED Final   Enterobacter cloacae complex NOT DETECTED NOT DETECTED Final   Escherichia coli NOT DETECTED NOT DETECTED Final   Klebsiella aerogenes NOT DETECTED NOT DETECTED Final   Klebsiella oxytoca NOT DETECTED NOT DETECTED Final   Klebsiella pneumoniae NOT DETECTED NOT DETECTED Final   Proteus species NOT DETECTED NOT DETECTED Final   Salmonella species NOT DETECTED NOT DETECTED Final   Serratia marcescens NOT DETECTED NOT DETECTED Final   Haemophilus influenzae NOT DETECTED NOT DETECTED Final   Neisseria meningitidis NOT DETECTED NOT DETECTED Final   Pseudomonas aeruginosa NOT DETECTED NOT DETECTED Final   Stenotrophomonas maltophilia NOT DETECTED NOT DETECTED Final   Candida albicans NOT DETECTED NOT DETECTED Final   Candida auris NOT DETECTED NOT DETECTED Final   Candida glabrata NOT DETECTED NOT DETECTED Final   Candida krusei NOT DETECTED NOT DETECTED Final   Candida parapsilosis NOT DETECTED NOT DETECTED Final   Candida tropicalis NOT DETECTED NOT DETECTED Final   Cryptococcus neoformans/gattii NOT DETECTED NOT DETECTED Final    Comment: Performed at [facility], [address] [place], [place] [address]  Culture, blood (Routine X 2) w Reflex to ID Panel     Status: None   Collection Time: 02/07/24  3:52 PM   Specimen: BLOOD  Result Value Ref Range Status   Specimen Description BLOOD BLOOD RIGHT ARM  Final   Special Requests   Final    BOTTLES DRAWN AEROBIC AND ANAEROBIC Blood Culture adequate volume   Culture   Final    NO GROWTH 5 DAYS Performed at [facility], [address] [place], [place] [address]    Report Status 02/12/2024 FINAL  Final  Culture, blood (Routine X 2) w Reflex to ID Panel     Status: None   Collection Time: 02/07/24  3:52 PM   Specimen: BLOOD  Result Value Ref Range Status   Specimen Description BLOOD BLOOD LEFT ARM  Final   Special Requests   Final     BOTTLES DRAWN AEROBIC AND ANAEROBIC Blood Culture adequate volume   Culture   Final    NO GROWTH 5 DAYS Performed at [facility], [address] [place], [place] [address]    Report Status 02/12/2024 FINAL  Final    Labs: CBC: Recent Labs  Lab 02/10/24 0918 02/11/24 0318  WBC 10.1 7.5  HGB 11.7* 10.9*  HCT 37.8 35.9*  MCV 87.3 85.7  PLT 129* 129*   Basic Metabolic Panel: Recent Labs  Lab 02/10/24 0918 02/11/24 0318 02/12/24 0149 02/13/24 0334 02/14/24 0112  NA 136 138 138 136 137  K 3.6 3.4* 4.2 4.1 4.3  CL 94* 93* 92* 92* 91*  CO2 31  32 34* 33* 36*  GLUCOSE 117* 95 109* 94 128*  BUN 17 18 22 22  24*  CREATININE 0.64 0.72 0.78 0.78 0.88  CALCIUM  8.4* 8.3* 8.4* 8.4* 8.5*  MG  --  2.1  --   --   --    Liver Function Tests: No results for input(s): AST, ALT, ALKPHOS, BILITOT, PROT, ALBUMIN in the last 168 hours. CBG: Recent Labs  Lab 02/13/24 1712 02/14/24 0650 02/14/24 1204 02/14/24 1603 02/15/24 0740  GLUCAP 195* 97 220* 173* 109*    Discharge time spent: greater than 30 minutes.  Signed: Montey Apa, DO [facility] 02/15/2024

## 2024-02-15 NOTE — Plan of Care (Signed)

## 2024-02-15 NOTE — TOC Transition Note (Signed)
 Transition of Care [facility]) - Discharge Note   Patient Details  Name: Sarah White MRN: [id number] Date of Birth: 01-19-1946  Transition of [facility]) CM/SW Contact:  Crayton Docker, RN Phone Number: 02/15/2024, 9:33 AM   Clinical Narrative:     Discharge orders, discharge summary noted faxed to [facility]. CM call to [employer], phone: [phone] to schedule BLS transport. CM spoke to [place]. BLS transport scheduled for 1100. CM call to Gena, Admissions, [facility] regarding discharge summary, orders and BLS transport of 1100 this morning. Per Gena, patient will admit to Room 708. CM provide [facility] room number, RN report number and BLS transport time. CM call to patient's son, [name], phone: [phone] regarding pending discharge and SNF room number. Per patient's son, will attempt to go by SNF this afternoon.   Final next level of care: Skilled Nursing Facility Barriers to Discharge: No Barriers Identified   Patient Goals and CMS Choice    SNF/[facility] [place]   Discharge Placement      SNF        Discharge Plan and Services Additional resources added to the After Visit Summary for       Social Drivers of Health (SDOH) Interventions SDOH Screenings   Food Insecurity: No Food Insecurity (02/04/2024)  Housing: Unknown (02/04/2024)  Transportation Needs: No Transportation Needs (02/04/2024)  Utilities: Not At Risk (02/04/2024)  Social Connections: Unknown (02/04/2024)  Tobacco Use: High Risk (02/04/2024)     Readmission Risk Interventions    02/08/2024   10:01 AM  Readmission Risk Prevention Plan  Transportation Screening Complete  PCP or Specialist Appt within 3-5 Days --  HRI or Home Care Consult --  Social Work Consult for Recovery Care Planning/Counseling --  Palliative Care Screening Not Applicable  Medication Review Oceanographer) Complete

## 2024-02-16 DIAGNOSIS — J189 Pneumonia, unspecified organism: Secondary | ICD-10-CM | POA: Diagnosis not present

## 2024-02-16 DIAGNOSIS — I5020 Unspecified systolic (congestive) heart failure: Secondary | ICD-10-CM | POA: Diagnosis not present

## 2024-02-16 DIAGNOSIS — J441 Chronic obstructive pulmonary disease with (acute) exacerbation: Secondary | ICD-10-CM | POA: Diagnosis not present

## 2024-02-16 DIAGNOSIS — E119 Type 2 diabetes mellitus without complications: Secondary | ICD-10-CM | POA: Diagnosis not present

## 2024-02-16 DIAGNOSIS — F32A Depression, unspecified: Secondary | ICD-10-CM | POA: Diagnosis not present

## 2024-02-16 DIAGNOSIS — F17291 Nicotine dependence, other tobacco product, in remission: Secondary | ICD-10-CM | POA: Diagnosis not present

## 2024-02-21 DIAGNOSIS — E119 Type 2 diabetes mellitus without complications: Secondary | ICD-10-CM | POA: Diagnosis not present

## 2024-02-21 DIAGNOSIS — I5020 Unspecified systolic (congestive) heart failure: Secondary | ICD-10-CM | POA: Diagnosis not present

## 2024-02-21 DIAGNOSIS — J189 Pneumonia, unspecified organism: Secondary | ICD-10-CM | POA: Diagnosis not present

## 2024-02-21 DIAGNOSIS — J441 Chronic obstructive pulmonary disease with (acute) exacerbation: Secondary | ICD-10-CM | POA: Diagnosis not present

## 2024-02-28 ENCOUNTER — Inpatient Hospital Stay: Admitting: Family Medicine

## 2024-02-28 DIAGNOSIS — J441 Chronic obstructive pulmonary disease with (acute) exacerbation: Secondary | ICD-10-CM | POA: Diagnosis not present

## 2024-02-28 DIAGNOSIS — J189 Pneumonia, unspecified organism: Secondary | ICD-10-CM | POA: Diagnosis not present

## 2024-02-28 DIAGNOSIS — I5020 Unspecified systolic (congestive) heart failure: Secondary | ICD-10-CM | POA: Diagnosis not present

## 2024-03-02 DIAGNOSIS — F331 Major depressive disorder, recurrent, moderate: Secondary | ICD-10-CM | POA: Diagnosis not present

## 2024-03-02 DIAGNOSIS — J441 Chronic obstructive pulmonary disease with (acute) exacerbation: Secondary | ICD-10-CM | POA: Diagnosis not present

## 2024-03-02 DIAGNOSIS — E119 Type 2 diabetes mellitus without complications: Secondary | ICD-10-CM | POA: Diagnosis not present

## 2024-03-02 DIAGNOSIS — I5020 Unspecified systolic (congestive) heart failure: Secondary | ICD-10-CM | POA: Diagnosis not present

## 2024-03-07 DIAGNOSIS — I5020 Unspecified systolic (congestive) heart failure: Secondary | ICD-10-CM | POA: Diagnosis not present

## 2024-03-07 DIAGNOSIS — J441 Chronic obstructive pulmonary disease with (acute) exacerbation: Secondary | ICD-10-CM | POA: Diagnosis not present

## 2024-03-08 ENCOUNTER — Ambulatory Visit: Admitting: Pulmonary Disease

## 2024-03-09 DIAGNOSIS — J441 Chronic obstructive pulmonary disease with (acute) exacerbation: Secondary | ICD-10-CM | POA: Diagnosis not present

## 2024-03-09 DIAGNOSIS — I5020 Unspecified systolic (congestive) heart failure: Secondary | ICD-10-CM | POA: Diagnosis not present

## 2024-03-16 DIAGNOSIS — R531 Weakness: Secondary | ICD-10-CM | POA: Diagnosis not present

## 2024-03-16 DIAGNOSIS — J441 Chronic obstructive pulmonary disease with (acute) exacerbation: Secondary | ICD-10-CM | POA: Diagnosis not present

## 2024-03-16 DIAGNOSIS — F331 Major depressive disorder, recurrent, moderate: Secondary | ICD-10-CM | POA: Diagnosis not present

## 2024-03-16 DIAGNOSIS — I5020 Unspecified systolic (congestive) heart failure: Secondary | ICD-10-CM | POA: Diagnosis not present

## 2024-03-20 ENCOUNTER — Other Ambulatory Visit: Payer: Self-pay

## 2024-03-20 ENCOUNTER — Emergency Department
Admission: EM | Admit: 2024-03-20 | Discharge: 2024-03-20 | Disposition: A | Discharge status: Home / Self Care | Attending: Emergency Medicine | Admitting: Emergency Medicine

## 2024-03-20 ENCOUNTER — Emergency Department

## 2024-03-20 DIAGNOSIS — F0390 Unspecified dementia without behavioral disturbance: Secondary | ICD-10-CM | POA: Diagnosis not present

## 2024-03-20 DIAGNOSIS — S0990XA Unspecified injury of head, initial encounter: Secondary | ICD-10-CM | POA: Diagnosis not present

## 2024-03-20 DIAGNOSIS — I509 Heart failure, unspecified: Secondary | ICD-10-CM | POA: Diagnosis not present

## 2024-03-20 DIAGNOSIS — R609 Edema, unspecified: Secondary | ICD-10-CM | POA: Diagnosis not present

## 2024-03-20 DIAGNOSIS — J441 Chronic obstructive pulmonary disease with (acute) exacerbation: Secondary | ICD-10-CM | POA: Diagnosis not present

## 2024-03-20 DIAGNOSIS — Z7401 Bed confinement status: Secondary | ICD-10-CM | POA: Diagnosis not present

## 2024-03-20 DIAGNOSIS — I959 Hypotension, unspecified: Secondary | ICD-10-CM | POA: Diagnosis not present

## 2024-03-20 DIAGNOSIS — R55 Syncope and collapse: Secondary | ICD-10-CM | POA: Diagnosis not present

## 2024-03-20 DIAGNOSIS — W01198A Fall on same level from slipping, tripping and stumbling with subsequent striking against other object, initial encounter: Secondary | ICD-10-CM | POA: Diagnosis not present

## 2024-03-20 DIAGNOSIS — S199XXA Unspecified injury of neck, initial encounter: Secondary | ICD-10-CM | POA: Diagnosis not present

## 2024-03-20 DIAGNOSIS — N189 Chronic kidney disease, unspecified: Secondary | ICD-10-CM | POA: Insufficient documentation

## 2024-03-20 DIAGNOSIS — Y92129 Unspecified place in nursing home as the place of occurrence of the external cause: Secondary | ICD-10-CM | POA: Diagnosis not present

## 2024-03-20 DIAGNOSIS — S0993XA Unspecified injury of face, initial encounter: Secondary | ICD-10-CM | POA: Diagnosis not present

## 2024-03-20 DIAGNOSIS — J439 Emphysema, unspecified: Secondary | ICD-10-CM | POA: Diagnosis not present

## 2024-03-20 DIAGNOSIS — J449 Chronic obstructive pulmonary disease, unspecified: Secondary | ICD-10-CM | POA: Diagnosis not present

## 2024-03-20 DIAGNOSIS — W19XXXA Unspecified fall, initial encounter: Secondary | ICD-10-CM | POA: Diagnosis not present

## 2024-03-20 DIAGNOSIS — R531 Weakness: Secondary | ICD-10-CM | POA: Diagnosis not present

## 2024-03-20 DIAGNOSIS — M47812 Spondylosis without myelopathy or radiculopathy, cervical region: Secondary | ICD-10-CM | POA: Diagnosis not present

## 2024-03-20 DIAGNOSIS — R9082 White matter disease, unspecified: Secondary | ICD-10-CM | POA: Diagnosis not present

## 2024-03-20 DIAGNOSIS — I10 Essential (primary) hypertension: Secondary | ICD-10-CM | POA: Diagnosis not present

## 2024-03-20 DIAGNOSIS — R29898 Other symptoms and signs involving the musculoskeletal system: Secondary | ICD-10-CM | POA: Diagnosis not present

## 2024-03-20 DIAGNOSIS — M4802 Spinal stenosis, cervical region: Secondary | ICD-10-CM | POA: Diagnosis not present

## 2024-03-20 LAB — CBC WITH DIFFERENTIAL/PLATELET
Abs Immature Granulocytes: 0.02 K/uL (ref 0.00–0.07)
Basophils Absolute: 0 K/uL (ref 0.0–0.1)
Basophils Relative: 0 %
Eosinophils Absolute: 0 K/uL (ref 0.0–0.5)
Eosinophils Relative: 1 %
HCT: 37.1 % (ref 36.0–46.0)
Hemoglobin: 11.7 g/dL — ABNORMAL LOW (ref 12.0–15.0)
Immature Granulocytes: 0 %
Lymphocytes Relative: 12 %
Lymphs Abs: 0.6 K/uL — ABNORMAL LOW (ref 0.7–4.0)
MCH: 27.2 pg (ref 26.0–34.0)
MCHC: 31.5 g/dL (ref 30.0–36.0)
MCV: 86.3 fL (ref 80.0–100.0)
Monocytes Absolute: 0.4 K/uL (ref 0.1–1.0)
Monocytes Relative: 7 %
Neutro Abs: 3.8 K/uL (ref 1.7–7.7)
Neutrophils Relative %: 80 %
Platelets: 147 K/uL — ABNORMAL LOW (ref 150–400)
RBC: 4.3 MIL/uL (ref 3.87–5.11)
RDW: 16.4 % — ABNORMAL HIGH (ref 11.5–15.5)
WBC: 4.9 K/uL (ref 4.0–10.5)
nRBC: 0 % (ref 0.0–0.2)

## 2024-03-20 LAB — COMPREHENSIVE METABOLIC PANEL WITH GFR
ALT: 65 U/L — ABNORMAL HIGH (ref 0–44)
AST: 101 U/L — ABNORMAL HIGH (ref 15–41)
Albumin: 3.1 g/dL — ABNORMAL LOW (ref 3.5–5.0)
Alkaline Phosphatase: 78 U/L (ref 38–126)
Anion gap: 10 (ref 5–15)
BUN: 16 mg/dL (ref 8–23)
CO2: 27 mmol/L (ref 22–32)
Calcium: 8.9 mg/dL (ref 8.9–10.3)
Chloride: 98 mmol/L (ref 98–111)
Creatinine, Ser: 0.84 mg/dL (ref 0.44–1.00)
GFR, Estimated: >60 mL/min (ref >60)
Glucose, Bld: 110 mg/dL — ABNORMAL HIGH (ref 70–99)
Potassium: 4.1 mmol/L (ref 3.5–5.1)
Sodium: 135 mmol/L (ref 135–145)
Total Bilirubin: 0.8 mg/dL (ref 0.0–1.2)
Total Protein: 5.8 g/dL — ABNORMAL LOW (ref 6.5–8.1)

## 2024-03-20 MED ORDER — ACETAMINOPHEN 500 MG PO TABS
1000.0000 mg | ORAL_TABLET | Freq: Once | ORAL | Status: AC
  Administered 2024-03-20: 1000 mg via ORAL
  Filled 2024-03-20: qty 2

## 2024-03-20 NOTE — ED Provider Notes (Signed)
 [facility] Provider Note    Event Date/Time   First MD Initiated Contact with Patient 03/20/24 1640     (approximate)   History   Fall   HPI  Sarah White is a 78 y.o. female who presents to the ED for evaluation of Fall   I review a medical DC summary from 1 month ago.  Admitted for bacteremia, strep pneumo in the setting of pneumonia.  History of COPD, CHF, CKD.  Plavix  but no anticoagulation prescribed  Patient presents to the ED from a local SNF after she fell and struck her face on the ground.  She is able to provide all relevant history, reports reaching forward to grab something but I just kept going.  She reports face planting on the floor without syncope or additional trauma.  Son remains at the bedside and reports that she is at her baseline.  Notably, triage nursing notes indicates son witnessed a period of syncope.  He clarifies this, indicating that when he first approached the patient in the lobby she did not immediately recognize him.  He reports this is normal in the setting of her dementia.  He reports that she has been in and out of falling asleep, but no other particular concerns and again reports that she seems normal to him.  He has been with her for nearly 4 hours now that they have been waiting prior to my evaluation.   Physical Exam   Triage Vital Signs: ED Triage Vitals  Encounter Vitals Group     BP 03/20/24 1344 125/81     Girls Systolic BP Percentile --      Girls Diastolic BP Percentile --      Boys Systolic BP Percentile --      Boys Diastolic BP Percentile --      Pulse Rate 03/20/24 1344 71     Resp 03/20/24 1344 18     Temp 03/20/24 1344 97.9 F (36.6 C)     Temp Source 03/20/24 1344 Oral     SpO2 03/20/24 1344 95 %     Weight --      Height --      Head Circumference --      Peak Flow --      Pain Score 03/20/24 1342 7     Pain Loc --      Pain Education --      Exclude from Growth Chart --     Most  recent vital signs: Vitals:   03/20/24 1344  BP: 125/81  Pulse: 71  Resp: 18  Temp: 97.9 F (36.6 C)  SpO2: 95%    General: Awake, no distress.  CV:  Good peripheral perfusion.  Resp:  Normal effort.  Abd:  No distention.  MSK:  No deformity noted.  Palpation of all 4 extremities without evidence of deformity, tenderness or trauma. Neuro:  No focal deficits appreciated. Other:  Small amount of dried blood around the nose, but no deformity or evidence of active bleeding.  No posterior epistaxis.   ED Results / Procedures / Treatments   Labs (all labs ordered are listed, but only abnormal results are displayed) Labs Reviewed  COMPREHENSIVE METABOLIC PANEL WITH GFR - Abnormal; Notable for the following components:      Result Value   Glucose, Bld 110 (*)    Total Protein 5.8 (*)    Albumin 3.1 (*)    AST 101 (*)    ALT 65 (*)  All other components within normal limits  CBC WITH DIFFERENTIAL/PLATELET - Abnormal; Notable for the following components:   Hemoglobin 11.7 (*)    RDW 16.4 (*)    Platelets 147 (*)    Lymphs Abs 0.6 (*)    All other components within normal limits    EKG Sinus rhythm with a rate of 70 bpm.  Leftward axis, left bundle and no STEMI by Sgarbossa criteria Comparison EKG from last month with a similar left bundle morphology  RADIOLOGY CT head interpreted by me without evidence of acute intracranial pathology CT cervical spine interpreted by me without evidence of fracture or dislocation CT maxillofacial interpreted by me without fracture  Official radiology report(s): CT Maxillofacial WO CM Result Date: 03/20/2024 CLINICAL DATA:  Facial trauma, blunt EXAM: CT MAXILLOFACIAL WITHOUT CONTRAST TECHNIQUE: Multidetector CT imaging of the maxillofacial structures was performed. Multiplanar CT image reconstructions were also generated. RADIATION DOSE REDUCTION: This exam was performed according to the departmental dose-optimization program which includes  automated exposure control, adjustment of the mA and/or kV according to patient size and/or use of iterative reconstruction technique. COMPARISON:  None Available. FINDINGS: Osseous: The patient ones are intact. No osseous lesions are present. Orbits: Unremarkable.  No evidence of acute traumatic injury. Sinuses: Clear paranasal sinuses and mastoid air cells. Soft tissues: No significant soft tissue swelling or soft tissue injury evident. Limited intracranial: Negative. IMPRESSION: Negative. Electronically Signed   By: Evalene Coho M.D.   On: 03/20/2024 14:50   CT Head Wo Contrast Result Date: 03/20/2024 CLINICAL DATA:  Head trauma, minor (Age >= 65y) EXAM: CT HEAD WITHOUT CONTRAST TECHNIQUE: Contiguous axial images were obtained from the base of the skull through the vertex without intravenous contrast. RADIATION DOSE REDUCTION: This exam was performed according to the departmental dose-optimization program which includes automated exposure control, adjustment of the mA and/or kV according to patient size and/or use of iterative reconstruction technique. COMPARISON:  MRI of the head dated June 05, 2020. FINDINGS: Brain: Age-related atrophy. Mild periventricular white matter disease. No evidence of hemorrhage, mass acute cortical infarct or hydrocephalus. Vascular: Negative. Skull: Intact and unremarkable. Sinuses/Orbits: Clear paranasal sinuses and mastoid air cells. Normal orbits. Other: None. IMPRESSION: 1. Mild periventricular white matter disease. No evidence of acute traumatic injury. Electronically Signed   By: Evalene Coho M.D.   On: 03/20/2024 14:39   CT Cervical Spine Wo Contrast Result Date: 03/20/2024 CLINICAL DATA:  Neck trauma.  Patient fell, striking face. EXAM: CT CERVICAL SPINE WITHOUT CONTRAST TECHNIQUE: Multidetector CT imaging of the cervical spine was performed without intravenous contrast. Multiplanar CT image reconstructions were also generated. RADIATION DOSE REDUCTION: This  exam was performed according to the departmental dose-optimization program which includes automated exposure control, adjustment of the mA and/or kV according to patient size and/or use of iterative reconstruction technique. COMPARISON:  Neck CTA 02/07/2020. FINDINGS: Alignment: Mildly exaggerated cervical lordosis. No focal angulation or listhesis. Skull base and vertebrae: No evidence of acute cervical spine fracture or traumatic subluxation. 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 Chronic degenerative changes anteriorly at C1-2. Soft tissues and spinal canal: No prevertebral fluid or swelling. No visible canal hematoma. Disc levels: Similar chronic multilevel spondylosis with disc space narrowing and uncinate spurring most advanced at C6-7. Asymmetric facet hypertrophy on the left contributes to left foraminal narrowing at C4-5, C5-6 and C6-7. No large disc herniation identified. As above, congenital incomplete segmentation at C3-4. Upper chest: Emphysematous changes and scarring at both lung apices. Other: Right carotid stent.  Bilateral  carotid atherosclerosis. IMPRESSION: 1. No evidence of acute cervical spine fracture, traumatic subluxation or static signs of instability. 2. Chronic multilevel cervical spondylosis as described. Electronically Signed   By: Elsie Perone M.D.   On: 03/20/2024 14:37    PROCEDURES and INTERVENTIONS:  .1-3 Lead EKG Interpretation  Performed by: Claudene Rover, MD Authorized by: Claudene Rover, MD     Interpretation: normal     ECG rate:  70   ECG rate assessment: normal     Rhythm: sinus rhythm     Ectopy: none     Conduction: normal     Medications  acetaminophen  (TYLENOL ) tablet 1,000 mg (has no administration in time range)     IMPRESSION / MDM / ASSESSMENT AND PLAN / ED COURSE  I reviewed the triage vital signs and the nursing notes.  Differential diagnosis includes, but is not limited to, syncope, ICH, fracture, cardiac dysrhythmia  {Patient  presents with symptoms of an acute illness or injury that is potentially life-threatening.  Patient presents to the ED after a fall.  Looks well to me without clear signs of acute trauma.  Had some dried blood around the nose but no signs of intraoral damage, epistaxis.  No other signs of significant injury.  Screening blood work is reassuring with normal CBC and lites/renal function.  Minimal transaminitis is noted but she has no abdominal pain, emesis.  Normal bilirubin alkaline phosphatase.  Less likely relevant to today's visit.  No dysrhythmias in the monitor or EKG.  I considered admission for this patient but believe she would be suitable for outpatient management.  Sinus in agreement.      FINAL CLINICAL IMPRESSION(S) / ED DIAGNOSES   Final diagnoses:  Fall, initial encounter     Rx / DC Orders   ED Discharge Orders     None        Note:  This document was prepared using Dragon voice recognition software and may include unintentional dictation errors.   Claudene Rover, MD 03/20/24 [phone]

## 2024-03-20 NOTE — ED Notes (Signed)
 [facility] called for transport to [facility] , spoke with Leita

## 2024-03-20 NOTE — ED Notes (Signed)
 First Nurse Note: Pt to ED via [facility] from [facility] for fall. Pt fell from sitting on her walker. Pt fell face first. Pt did have bleeding from her nose. Bleeding has stopped. Pt how having swelling and pain in her nose. Pt also c/o neck pain, pt has c collar on. Pt is on O2 at baseline at 2 liters. Pt VSS with EMS. CBG WNL.

## 2024-03-20 NOTE — ED Triage Notes (Signed)
 EMS from [facility] for a fall. Pt states she was in wheelchair and reached to get something and fell in the floor. Struck her head unknown if LOC. Patient is on Blood thinners. Recently dced from the hospital for Pneumonia. While in triage son states he saw her go in and out of consciousness

## 2024-03-20 NOTE — ED Provider Triage Note (Signed)
 Emergency Medicine Provider Triage Evaluation Note  Sarah White , a 78 y.o. female  was evaluated in triage.  Pt complains of fell out of her wheelchair after leaning down to pick something up from the ground. ?LOC, does take a blood thinner.   Review of Systems  Positive:  Negative:   Physical Exam  There were no vitals taken for this visit. Gen:   Awake, no distress   Resp:  Normal effort  MSK:   Moves extremities without difficulty  Other:  Skin tear right hand  Medical Decision Making  Medically screening exam initiated at 1:42 PM.  Appropriate orders placed.  Sarah White was informed that the remainder of the evaluation will be completed by another provider, this initial triage assessment does not replace that evaluation, and the importance of remaining in the ED until their evaluation is complete.     Sarah White LABOR, PA-C 03/20/24 1347

## 2024-03-21 ENCOUNTER — Emergency Department

## 2024-03-21 ENCOUNTER — Encounter: Payer: Self-pay | Admitting: Emergency Medicine

## 2024-03-21 ENCOUNTER — Emergency Department
Admission: EM | Admit: 2024-03-21 | Discharge: 2024-03-22 | Disposition: A | Discharge status: Skilled Nursing Facility | Attending: Emergency Medicine | Admitting: Emergency Medicine

## 2024-03-21 DIAGNOSIS — S51011A Laceration without foreign body of right elbow, initial encounter: Secondary | ICD-10-CM | POA: Insufficient documentation

## 2024-03-21 DIAGNOSIS — S61211A Laceration without foreign body of left index finger without damage to nail, initial encounter: Secondary | ICD-10-CM | POA: Insufficient documentation

## 2024-03-21 DIAGNOSIS — Z043 Encounter for examination and observation following other accident: Secondary | ICD-10-CM | POA: Diagnosis not present

## 2024-03-21 DIAGNOSIS — F0390 Unspecified dementia without behavioral disturbance: Secondary | ICD-10-CM | POA: Insufficient documentation

## 2024-03-21 DIAGNOSIS — S61212A Laceration without foreign body of right middle finger without damage to nail, initial encounter: Secondary | ICD-10-CM | POA: Diagnosis not present

## 2024-03-21 DIAGNOSIS — S022XXA Fracture of nasal bones, initial encounter for closed fracture: Secondary | ICD-10-CM | POA: Diagnosis not present

## 2024-03-21 DIAGNOSIS — S199XXA Unspecified injury of neck, initial encounter: Secondary | ICD-10-CM | POA: Diagnosis not present

## 2024-03-21 DIAGNOSIS — S61512A Laceration without foreign body of left wrist, initial encounter: Secondary | ICD-10-CM | POA: Diagnosis not present

## 2024-03-21 DIAGNOSIS — Z7902 Long term (current) use of antithrombotics/antiplatelets: Secondary | ICD-10-CM | POA: Insufficient documentation

## 2024-03-21 DIAGNOSIS — J439 Emphysema, unspecified: Secondary | ICD-10-CM | POA: Diagnosis not present

## 2024-03-21 DIAGNOSIS — E1122 Type 2 diabetes mellitus with diabetic chronic kidney disease: Secondary | ICD-10-CM | POA: Insufficient documentation

## 2024-03-21 DIAGNOSIS — S0993XA Unspecified injury of face, initial encounter: Secondary | ICD-10-CM | POA: Diagnosis not present

## 2024-03-21 DIAGNOSIS — T148XXA Other injury of unspecified body region, initial encounter: Secondary | ICD-10-CM

## 2024-03-21 DIAGNOSIS — I959 Hypotension, unspecified: Secondary | ICD-10-CM | POA: Diagnosis not present

## 2024-03-21 DIAGNOSIS — W19XXXA Unspecified fall, initial encounter: Secondary | ICD-10-CM | POA: Diagnosis not present

## 2024-03-21 DIAGNOSIS — N189 Chronic kidney disease, unspecified: Secondary | ICD-10-CM | POA: Diagnosis not present

## 2024-03-21 DIAGNOSIS — S0990XA Unspecified injury of head, initial encounter: Secondary | ICD-10-CM | POA: Diagnosis not present

## 2024-03-21 DIAGNOSIS — Z85118 Personal history of other malignant neoplasm of bronchus and lung: Secondary | ICD-10-CM | POA: Insufficient documentation

## 2024-03-21 DIAGNOSIS — W19XXXD Unspecified fall, subsequent encounter: Secondary | ICD-10-CM

## 2024-03-21 DIAGNOSIS — R531 Weakness: Secondary | ICD-10-CM | POA: Diagnosis not present

## 2024-03-21 DIAGNOSIS — J441 Chronic obstructive pulmonary disease with (acute) exacerbation: Secondary | ICD-10-CM | POA: Diagnosis not present

## 2024-03-21 NOTE — ED Notes (Signed)
 This RN along with [place], EDT, assisted patient with changing their brief. Used bathwipes to perform pericare. New bedpad put under patient.

## 2024-03-21 NOTE — ED Provider Notes (Signed)
 [facility] Provider Note    Event Date/Time   First MD Initiated Contact with Patient 03/21/24 2026     (approximate)   History   Fall   HPI  Sarah White is a 78 y.o. female  with a past medical history of emphysema, diabetes, chronic kidney disease, unstable angina, non-small cell lung cancer, thrombocytopenia, dementia presents to the emergency department following a fall that occurred today at [facility].  Patient is unsure if she hit her head or exactly how she fell.  Son is present in the room and helping provide history.  They are unsure if any employee at [facility] witnessed her fall today.  Denies blurry vision, vision changes, nausea, vomiting, chest pain, shortness of breath, LOC, dizziness, syncope.  She reports right knee pain and has several skin tears to her bilateral arms and hands.  She wears 2 L oxygen by nasal cannula at baseline.  Patient does take Plavix .  Patient was seen here for a fall yesterday without any fractures requiring her to stay in the hospital.  I reviewed the emergency department note from yesterday.       Physical Exam   Triage Vital Signs: ED Triage Vitals  Encounter Vitals Group     BP 03/21/24 1839 (!) 124/56     Girls Systolic BP Percentile --      Girls Diastolic BP Percentile --      Boys Systolic BP Percentile --      Boys Diastolic BP Percentile --      Pulse Rate 03/21/24 1839 71     Resp 03/21/24 1839 18     Temp 03/21/24 1839 98.1 F (36.7 C)     Temp Source 03/21/24 1839 Oral     SpO2 03/21/24 1839 99 %     Weight 03/21/24 1839 165 lb 5.5 oz (75 kg)     Height 03/21/24 1839 5' 4 (1.626 m)     Head Circumference --      Peak Flow --      Pain Score 03/21/24 1838 9     Pain Loc --      Pain Education --      Exclude from Growth Chart --     Most recent vital signs: Vitals:   03/21/24 1839 03/21/24 2156  BP: (!) 124/56 (!) 147/53  Pulse: 71 69  Resp: 18 18  Temp: 98.1 F (36.7  C) 97.7 F (36.5 C)  SpO2: 99% 97%    General: Awake, in no acute distress. Head: Normocephalic, atraumatic. Eyes: PERRLA. EOMs intact.  Ears/Nose/Throat: TMs intact b/l. Nares patent, no nasal discharge but there is a small amount of dried blood; swelling noted to the bilateral nose, contusion noted on the right side near the maxillary sinuses. Oropharynx moist, no erythema or exudate.  Neck: Supple. CV: Regular rate, 69 bpm. Peripheral pulses 2+ and symmetric. No edema. Respiratory: No respiratory distress. Normal respiratory effort. GI: Soft, non-distended. MSK: Moving all extremities with ease.  No evidence of any gross deformity.  Nontender to palpation along the right knee. Skin:Warm, dry.  Multiple skin tears noted on the right elbow, right middle finger, left pointer finger, and left wrist. Neurological: A&Ox4 to person, place, time, and situation. Sensation intact. Strength symmetric. No focal deficits.  Psychiatric: Mood and affect appropriate. Thought processes coherent.   ED Results / Procedures / Treatments   Labs (all labs ordered are listed, but only abnormal results are displayed) Labs Reviewed -  No data to display   EKG     RADIOLOGY X-ray of right knee as well as CTs of the head, cervical spine, and maxillofacial area ordered.  Knee IMPRESSION: No acute displaced fracture or dislocation.  CT findings IMPRESSION: 1. No acute intracranial abnormality. 2. Age-indeterminate minimally displaced fracture of the nasal process of the right maxilla correlate with point tenderness to palpation to evaluate for an acute component. 3. No definite acute displaced fracture or traumatic listhesis of the cervical spine. 4.  Emphysema (ICD10-J43.9).   PROCEDURES:  Critical Care performed: No   Procedures   MEDICATIONS ORDERED IN ED: Medications - No data to display   IMPRESSION / MDM / ASSESSMENT AND PLAN / ED COURSE  I reviewed the triage vital signs and  the nursing notes.                              Differential diagnosis includes, but is not limited to, mechanical fall, skin tears, nasal process fracture, subdural epidural hematoma, cranial hemorrhage  Patient's presentation is most consistent with acute presentation with potential threat to life or bodily function.  Patient is a 78 year old female who presented today following a mechanical fall.  Reported right knee pain, x-ray was ordered that I personally viewed and interpreted as well as the radiologist report.  I agree with the radiologist that there is no evidence of acute fracture or dislocation. CTs of the head, cervical spine without any acute findings.  CT of the maxillofacial area shows a minimally displaced nasal process fracture of the right maxilla; consistent with her swelling and point tenderness on physical exam.  Discussed refraining from blowing her nose with her.  Patient's nurse did irrigate and clean skin tears. Nursing home can provide appropriate wound care regarding skin tears.  Will have her follow-up with [facility] in 1 week.  She should also follow-up with her primary care provider following today's fall.  Discussed these findings with her son, he is in agreement to the treatment plan.    Patient was given the opportunity to ask questions; all questions were answered. Emergency department return precautions were discussed with the patient.  Patient is in agreement to the treatment plan.  Patient is stable for discharge.   FINAL CLINICAL IMPRESSION(S) / ED DIAGNOSES   Final diagnoses:  Fall, subsequent encounter  Multiple skin tears  Closed fracture of nasal bone, initial encounter     Rx / DC Orders   ED Discharge Orders     None        Note:  This document was prepared using Dragon voice recognition software and may include unintentional dictation errors.     Sheron Salm, PA-C 03/21/24 2218    Jacolyn Pae, MD 03/21/24 [phone]

## 2024-03-21 NOTE — Discharge Instructions (Signed)
 You have been seen in the Emergency Department (ED) today for a fall.  Your work up does not show any concerning injuries.  Please take over-the-counter ibuprofen  and/or Tylenol  as needed for your pain (unless you have an allergy or your doctor as told you not to take them), or take any prescribed medication as instructed.  Please follow up with your doctor regarding today's Emergency Department (ED) visit and your recent fall.  Please also follow-up with the ear nose and throat doctor listed for your fracture of your nose.  Please do not blow your nose for at least 4 to 6 weeks.  Return to the ED if you have any headache, confusion, slurred speech, weakness/numbness of any arm or leg, or any increased pain.

## 2024-03-21 NOTE — ED Notes (Signed)
 Patient transported to CT

## 2024-03-21 NOTE — ED Notes (Signed)
 Spoke with pam at [facility] and the will be here for patient transport to [facility]. All necessary paperwork will be sent

## 2024-03-21 NOTE — ED Notes (Signed)
 Pts skin tears on R elbow, R middle finger, L pointer finger, and L wrist irrigated with normal saline and dressed with nonadherent strips, rolled with dry guaze, and secured with tape.

## 2024-03-21 NOTE — ED Triage Notes (Signed)
 Patient to ED via [facility] from [facility] for a fall. Seen for same yesterday. Today, patient bent over from a sitting position. Denies hitting head today or LOC. Takes Plavix . Skin tears to right elbow and bilateral hands. Wears 2L [place] at baseline. C/o right knee pain/ swelling and neck/shoulder stiffness.

## 2024-03-22 DIAGNOSIS — R296 Repeated falls: Secondary | ICD-10-CM | POA: Diagnosis not present

## 2024-03-22 DIAGNOSIS — R279 Unspecified lack of coordination: Secondary | ICD-10-CM | POA: Diagnosis not present

## 2024-03-22 DIAGNOSIS — S022XXD Fracture of nasal bones, subsequent encounter for fracture with routine healing: Secondary | ICD-10-CM | POA: Diagnosis not present

## 2024-03-22 DIAGNOSIS — Z7401 Bed confinement status: Secondary | ICD-10-CM | POA: Diagnosis not present

## 2024-03-22 NOTE — ED Notes (Signed)
 [facility] is here picking up patient now for transport

## 2024-03-23 DIAGNOSIS — R296 Repeated falls: Secondary | ICD-10-CM | POA: Diagnosis not present

## 2024-03-23 DIAGNOSIS — J189 Pneumonia, unspecified organism: Secondary | ICD-10-CM | POA: Diagnosis not present

## 2024-03-27 DIAGNOSIS — J189 Pneumonia, unspecified organism: Secondary | ICD-10-CM | POA: Diagnosis not present

## 2024-03-27 DIAGNOSIS — S022XXD Fracture of nasal bones, subsequent encounter for fracture with routine healing: Secondary | ICD-10-CM | POA: Diagnosis not present

## 2024-04-01 DIAGNOSIS — J449 Chronic obstructive pulmonary disease, unspecified: Secondary | ICD-10-CM | POA: Diagnosis not present

## 2024-04-01 DIAGNOSIS — Z87898 Personal history of other specified conditions: Secondary | ICD-10-CM | POA: Diagnosis not present

## 2024-04-03 ENCOUNTER — Observation Stay
Admission: EM | Admit: 2024-04-03 | Discharge: 2024-04-04 | Disposition: A | Discharge status: Skilled Nursing Facility | Attending: Student | Admitting: Student

## 2024-04-03 ENCOUNTER — Emergency Department

## 2024-04-03 ENCOUNTER — Observation Stay

## 2024-04-03 ENCOUNTER — Other Ambulatory Visit: Payer: Self-pay

## 2024-04-03 DIAGNOSIS — D510 Vitamin B12 deficiency anemia due to intrinsic factor deficiency: Secondary | ICD-10-CM | POA: Diagnosis not present

## 2024-04-03 DIAGNOSIS — I130 Hypertensive heart and chronic kidney disease with heart failure and stage 1 through stage 4 chronic kidney disease, or unspecified chronic kidney disease: Secondary | ICD-10-CM | POA: Insufficient documentation

## 2024-04-03 DIAGNOSIS — I6523 Occlusion and stenosis of bilateral carotid arteries: Secondary | ICD-10-CM | POA: Diagnosis not present

## 2024-04-03 DIAGNOSIS — F0390 Unspecified dementia without behavioral disturbance: Secondary | ICD-10-CM | POA: Insufficient documentation

## 2024-04-03 DIAGNOSIS — R2990 Unspecified symptoms and signs involving the nervous system: Secondary | ICD-10-CM | POA: Diagnosis not present

## 2024-04-03 DIAGNOSIS — E1122 Type 2 diabetes mellitus with diabetic chronic kidney disease: Secondary | ICD-10-CM | POA: Insufficient documentation

## 2024-04-03 DIAGNOSIS — R29703 NIHSS score 3: Secondary | ICD-10-CM | POA: Diagnosis not present

## 2024-04-03 DIAGNOSIS — I2510 Atherosclerotic heart disease of native coronary artery without angina pectoris: Secondary | ICD-10-CM | POA: Insufficient documentation

## 2024-04-03 DIAGNOSIS — S022XXA Fracture of nasal bones, initial encounter for closed fracture: Secondary | ICD-10-CM | POA: Diagnosis not present

## 2024-04-03 DIAGNOSIS — Z95828 Presence of other vascular implants and grafts: Secondary | ICD-10-CM | POA: Diagnosis not present

## 2024-04-03 DIAGNOSIS — J189 Pneumonia, unspecified organism: Secondary | ICD-10-CM | POA: Diagnosis not present

## 2024-04-03 DIAGNOSIS — R55 Syncope and collapse: Secondary | ICD-10-CM | POA: Insufficient documentation

## 2024-04-03 DIAGNOSIS — N1831 Chronic kidney disease, stage 3a: Secondary | ICD-10-CM | POA: Diagnosis not present

## 2024-04-03 DIAGNOSIS — E785 Hyperlipidemia, unspecified: Secondary | ICD-10-CM | POA: Insufficient documentation

## 2024-04-03 DIAGNOSIS — R4781 Slurred speech: Principal | ICD-10-CM | POA: Insufficient documentation

## 2024-04-03 DIAGNOSIS — Z79899 Other long term (current) drug therapy: Secondary | ICD-10-CM | POA: Insufficient documentation

## 2024-04-03 DIAGNOSIS — R9089 Other abnormal findings on diagnostic imaging of central nervous system: Secondary | ICD-10-CM | POA: Diagnosis not present

## 2024-04-03 DIAGNOSIS — J439 Emphysema, unspecified: Secondary | ICD-10-CM | POA: Diagnosis not present

## 2024-04-03 DIAGNOSIS — J449 Chronic obstructive pulmonary disease, unspecified: Secondary | ICD-10-CM | POA: Insufficient documentation

## 2024-04-03 DIAGNOSIS — I5022 Chronic systolic (congestive) heart failure: Secondary | ICD-10-CM | POA: Diagnosis not present

## 2024-04-03 DIAGNOSIS — Z7982 Long term (current) use of aspirin: Secondary | ICD-10-CM | POA: Insufficient documentation

## 2024-04-03 DIAGNOSIS — E559 Vitamin D deficiency, unspecified: Secondary | ICD-10-CM | POA: Diagnosis not present

## 2024-04-03 DIAGNOSIS — H6121 Impacted cerumen, right ear: Secondary | ICD-10-CM | POA: Diagnosis not present

## 2024-04-03 DIAGNOSIS — Z7901 Long term (current) use of anticoagulants: Secondary | ICD-10-CM | POA: Insufficient documentation

## 2024-04-03 DIAGNOSIS — Q2547 Right aortic arch: Secondary | ICD-10-CM | POA: Diagnosis not present

## 2024-04-03 DIAGNOSIS — I639 Cerebral infarction, unspecified: Secondary | ICD-10-CM | POA: Diagnosis not present

## 2024-04-03 DIAGNOSIS — R9431 Abnormal electrocardiogram [ECG] [EKG]: Secondary | ICD-10-CM | POA: Insufficient documentation

## 2024-04-03 DIAGNOSIS — F17210 Nicotine dependence, cigarettes, uncomplicated: Secondary | ICD-10-CM | POA: Insufficient documentation

## 2024-04-03 DIAGNOSIS — R29818 Other symptoms and signs involving the nervous system: Principal | ICD-10-CM | POA: Insufficient documentation

## 2024-04-03 DIAGNOSIS — I10 Essential (primary) hypertension: Secondary | ICD-10-CM | POA: Diagnosis not present

## 2024-04-03 DIAGNOSIS — Z8542 Personal history of malignant neoplasm of other parts of uterus: Secondary | ICD-10-CM | POA: Insufficient documentation

## 2024-04-03 DIAGNOSIS — Z85118 Personal history of other malignant neoplasm of bronchus and lung: Secondary | ICD-10-CM | POA: Diagnosis not present

## 2024-04-03 DIAGNOSIS — Z794 Long term (current) use of insulin: Secondary | ICD-10-CM | POA: Diagnosis not present

## 2024-04-03 DIAGNOSIS — I959 Hypotension, unspecified: Secondary | ICD-10-CM | POA: Diagnosis not present

## 2024-04-03 DIAGNOSIS — R531 Weakness: Secondary | ICD-10-CM | POA: Diagnosis not present

## 2024-04-03 DIAGNOSIS — I672 Cerebral atherosclerosis: Secondary | ICD-10-CM | POA: Diagnosis not present

## 2024-04-03 DIAGNOSIS — N183 Chronic kidney disease, stage 3 unspecified: Secondary | ICD-10-CM

## 2024-04-03 DIAGNOSIS — R9082 White matter disease, unspecified: Secondary | ICD-10-CM | POA: Diagnosis not present

## 2024-04-03 LAB — CBC
HCT: 38.2 % (ref 36.0–46.0)
Hemoglobin: 11.9 g/dL — ABNORMAL LOW (ref 12.0–15.0)
MCH: 26.9 pg (ref 26.0–34.0)
MCHC: 31.2 g/dL (ref 30.0–36.0)
MCV: 86.4 fL (ref 80.0–100.0)
Platelets: 134 K/uL — ABNORMAL LOW (ref 150–400)
RBC: 4.42 MIL/uL (ref 3.87–5.11)
RDW: 16.7 % — ABNORMAL HIGH (ref 11.5–15.5)
WBC: 5.3 K/uL (ref 4.0–10.5)
nRBC: 0 % (ref 0.0–0.2)

## 2024-04-03 LAB — DIFFERENTIAL
Abs Immature Granulocytes: 0.03 K/uL (ref 0.00–0.07)
Basophils Absolute: 0 K/uL (ref 0.0–0.1)
Basophils Relative: 0 %
Eosinophils Absolute: 0.1 K/uL (ref 0.0–0.5)
Eosinophils Relative: 1 %
Immature Granulocytes: 1 %
Lymphocytes Relative: 14 %
Lymphs Abs: 0.7 K/uL (ref 0.7–4.0)
Monocytes Absolute: 0.5 K/uL (ref 0.1–1.0)
Monocytes Relative: 9 %
Neutro Abs: 4 K/uL (ref 1.7–7.7)
Neutrophils Relative %: 75 %

## 2024-04-03 LAB — COMPREHENSIVE METABOLIC PANEL WITH GFR
ALT: 69 U/L — ABNORMAL HIGH (ref 0–44)
AST: 148 U/L — ABNORMAL HIGH (ref 15–41)
Albumin: 3.1 g/dL — ABNORMAL LOW (ref 3.5–5.0)
Alkaline Phosphatase: 77 U/L (ref 38–126)
Anion gap: 15 (ref 5–15)
BUN: 17 mg/dL (ref 8–23)
CO2: 25 mmol/L (ref 22–32)
Calcium: 9.1 mg/dL (ref 8.9–10.3)
Chloride: 98 mmol/L (ref 98–111)
Creatinine, Ser: 1.01 mg/dL — ABNORMAL HIGH (ref 0.44–1.00)
GFR, Estimated: 58 mL/min — ABNORMAL LOW (ref >60)
Glucose, Bld: 158 mg/dL — ABNORMAL HIGH (ref 70–99)
Potassium: 4 mmol/L (ref 3.5–5.1)
Sodium: 138 mmol/L (ref 135–145)
Total Bilirubin: 0.7 mg/dL (ref 0.0–1.2)
Total Protein: 5.9 g/dL — ABNORMAL LOW (ref 6.5–8.1)

## 2024-04-03 LAB — TSH: TSH: 2.939 u[IU]/mL (ref 0.350–4.500)

## 2024-04-03 LAB — ETHANOL: Alcohol, Ethyl (B): <15 mg/dL (ref <15)

## 2024-04-03 LAB — PROTIME-INR
INR: 1.2 (ref 0.8–1.2)
Prothrombin Time: 16.2 s — ABNORMAL HIGH (ref 11.4–15.2)

## 2024-04-03 LAB — APTT: aPTT: 34 s (ref 24–36)

## 2024-04-03 LAB — MRSA NEXT GEN BY PCR, NASAL: MRSA by PCR Next Gen: NOT DETECTED

## 2024-04-03 LAB — MAGNESIUM: Magnesium: 2.2 mg/dL (ref 1.7–2.4)

## 2024-04-03 LAB — PHOSPHORUS: Phosphorus: 3.6 mg/dL (ref 2.5–4.6)

## 2024-04-03 LAB — GLUCOSE, CAPILLARY
Glucose-Capillary: 91 mg/dL (ref 70–99)
Glucose-Capillary: 83 mg/dL (ref 70–99)

## 2024-04-03 LAB — CBG MONITORING, ED: Glucose-Capillary: 155 mg/dL — ABNORMAL HIGH (ref 70–99)

## 2024-04-03 LAB — VITAMIN D 25 HYDROXY (VIT D DEFICIENCY, FRACTURES): Vit D, 25-Hydroxy: 11.97 ng/mL — ABNORMAL LOW (ref 30–100)

## 2024-04-03 LAB — CK: Total CK: 14 U/L — ABNORMAL LOW (ref 38–234)

## 2024-04-03 LAB — VITAMIN B12: Vitamin B-12: 284 pg/mL (ref 180–914)

## 2024-04-03 MED ORDER — TRAZODONE HCL 50 MG PO TABS: 50.0000 mg | ORAL_TABLET | Freq: Every evening | ORAL | Status: DC | PRN

## 2024-04-03 MED ORDER — SODIUM CHLORIDE 0.9 % IV SOLN: INTRAVENOUS | Status: AC

## 2024-04-03 MED ORDER — PANTOPRAZOLE SODIUM 40 MG PO TBEC
40.0000 mg | DELAYED_RELEASE_TABLET | Freq: Two times a day (BID) | ORAL | Status: DC
  Administered 2024-04-04: 40 mg via ORAL
  Filled 2024-04-03: qty 1

## 2024-04-03 MED ORDER — ALBUTEROL SULFATE (2.5 MG/3ML) 0.083% IN NEBU: 2.5000 mg | INHALATION_SOLUTION | Freq: Four times a day (QID) | RESPIRATORY_TRACT | Status: DC | PRN

## 2024-04-03 MED ORDER — ASPIRIN 81 MG PO TBEC
81.0000 mg | DELAYED_RELEASE_TABLET | Freq: Every day | ORAL | Status: DC
  Administered 2024-04-04: 81 mg via ORAL
  Filled 2024-04-03: qty 1

## 2024-04-03 MED ORDER — CLOPIDOGREL BISULFATE 75 MG PO TABS: 75.0000 mg | ORAL_TABLET | Freq: Every day | ORAL | Status: DC

## 2024-04-03 MED ORDER — ACETAMINOPHEN 160 MG/5ML PO SOLN: 650.0000 mg | ORAL | Status: DC | PRN

## 2024-04-03 MED ORDER — FLUTICASONE FUROATE-VILANTEROL 100-25 MCG/ACT IN AEPB
1.0000 | INHALATION_SPRAY | Freq: Every day | RESPIRATORY_TRACT | Status: DC
  Administered 2024-04-04: 1 via RESPIRATORY_TRACT
  Filled 2024-04-03: qty 28

## 2024-04-03 MED ORDER — INSULIN ASPART 100 UNIT/ML IJ SOLN
0.0000 [IU] | Freq: Three times a day (TID) | INTRAMUSCULAR | Status: DC
  Administered 2024-04-04: 1 [IU] via SUBCUTANEOUS
  Filled 2024-04-03: qty 1

## 2024-04-03 MED ORDER — ACETAMINOPHEN 650 MG RE SUPP: 650.0000 mg | RECTAL | Status: DC | PRN

## 2024-04-03 MED ORDER — STROKE: EARLY STAGES OF RECOVERY BOOK
Freq: Once | Status: AC
  Administered 2024-04-04: 1

## 2024-04-03 MED ORDER — ACETAMINOPHEN 325 MG PO TABS
650.0000 mg | ORAL_TABLET | ORAL | Status: DC | PRN
Start: 2024-04-03 — End: 2024-04-04

## 2024-04-03 MED ORDER — IOHEXOL 350 MG/ML SOLN
75.0000 mL | Freq: Once | INTRAVENOUS | Status: AC | PRN
  Administered 2024-04-03: 75 mL via INTRAVENOUS

## 2024-04-03 MED ORDER — ROSUVASTATIN CALCIUM 20 MG PO TABS
20.0000 mg | ORAL_TABLET | Freq: Every day | ORAL | Status: DC
  Administered 2024-04-04: 20 mg via ORAL
  Filled 2024-04-03: qty 1

## 2024-04-03 MED ORDER — ONDANSETRON HCL 4 MG/2ML IJ SOLN: 4.0000 mg | Freq: Four times a day (QID) | INTRAMUSCULAR | Status: DC | PRN

## 2024-04-03 MED ORDER — ENOXAPARIN SODIUM 40 MG/0.4ML IJ SOSY
40.0000 mg | PREFILLED_SYRINGE | INTRAMUSCULAR | Status: DC
  Administered 2024-04-03 – 2024-04-04 (×2): 40 mg via SUBCUTANEOUS
  Filled 2024-04-03 (×2): qty 0.4

## 2024-04-03 MED ORDER — SODIUM CHLORIDE 0.9% FLUSH
3.0000 mL | Freq: Once | INTRAVENOUS | Status: AC
  Administered 2024-04-03: 3 mL via INTRAVENOUS

## 2024-04-03 MED ORDER — SODIUM CHLORIDE 0.9 % IV SOLN: INTRAVENOUS | Status: DC

## 2024-04-03 MED ORDER — SENNOSIDES-DOCUSATE SODIUM 8.6-50 MG PO TABS: 1.0000 | ORAL_TABLET | Freq: Every evening | ORAL | Status: DC | PRN

## 2024-04-03 NOTE — Consult Note (Addendum)
 [facility] Telemedicine Consult   Requesting Provider: Dr. Nicholaus  Chief Complaint: slurry speech  HPI: 78 yo F with hx of HTN, HLD, DM, CHF, carotid stenosis s/p right ICA stent, CAD, CKD 3, smoker, COPD presented to ED for code stroke  Per EMS, pt lives in a facility and went to appointment this am, come back normal baseline. Around 11am, preparing for eating lunch, complained not feeling well, found to have generalized weakness, not able to sit up, with difficulty speech. SBP was 80s, giving midodrine and symptoms improved to only dysarthria. No more weakness but still slurry speech. Code stroke activated. On arrival, pt awake alert, orientated to age but not to month, slight right facial droop and questionable slurry speech (pt said that was her normal speech). No weakness and sensory symmetrical. No hemianopia. No aphasia. CT no acute finding. CTA head and neck no LVO, right ICA stent patent.   LKW: 11am tpa given?: No, at 4.5h time limit with non-disabling symptoms IR Thrombectomy? No, no sign of LVO Modified Rankin Scale: 3-Moderate disability-requires help but walks WITHOUT assistance   Exam:  General - Well nourished, well developed, in no apparent distress, mild lethargy.  Ophthalmologic - fundi not visualized due to noncooperation.  Cardiovascular - Regular rhythm and rate.  Neuro - awake, alert, mild lethargy, eyes open, orientated to age, time. No aphasia, paucity of speech but answer questions appropriately, mild dysarthria (not sure baseline), following all simple commands. Able to name 5/6 and repeat in dysarthric voice. No gaze palsy, tracking bilaterally, visual field full. Slight right facial droop. Tongue midline. Bilateral UEs 4/5, no drift. Bilaterally LEs 4/5, no drift. Sensation symmetrical bilaterally, b/l FTN intact but slow, gait not tested.  SABRA   NIH Stroke Scale  Level Of Consciousness 0=Alert; keenly responsive 1=Arouse to minor  stimulation 2=Requires repeated stimulation to arouse or movements to pain 3=postures or unresponsive 0  LOC Questions to Month and Age 54=Answers both questions correctly 1=Answers one question correctly or dysarthria/intubated/trauma/language barrier 2=Answers neither question correctly or aphasia 1  LOC Commands      -Open/Close eyes     -Open/close grip     -Pantomime commands if communication barrier 0=Performs both tasks correctly 1=Performs one task correctly 2=Performs neighter task correctly 0  Best Gaze     -Only assess horizontal gaze 0=Normal 1=Partial gaze palsy 2=Forced deviation, or total gaze paresis 0  Visual 0=No visual loss 1=Partial hemianopia 2=Complete hemianopia 3=Bilateral hemianopia (blind including cortical blindness) 0  Facial Palsy     -Use grimace if obtunded 0=Normal symmetrical movement 1=Minor paralysis (asymmetry) 2=Partial paralysis (lower face) 3=Complete paralysis (upper and lower face) 1  Motor  0=No drift for 10/5 seconds 1=Drift, but does not hit bed 2=Some antigravity effort, hits  bed 3=No effort against gravity, limb falls 4=No movement 0=Amputation/joint fusion Right Arm 0     Leg 0    Left Arm 0     Leg 0  Limb Ataxia     - FNT/HTS 0=Absent or does not understand or paralyzed or amputation/joint fusion 1=Present in one limb 2=Present in two limbs 0  Sensory 0=Normal 1=Mild to moderate sensory loss 2=Severe to total sensory loss or coma/unresponsive 0  Best Language 0=No aphasia, normal 1=Mild to moderate aphasia 2=Severe aphasia 3=Mute, global aphasia, or coma/unresponsive 0  Dysarthria 0=Normal 1=Mild to moderate 2=Severe, unintelligible or mute/anarthric 0=intubated/unable to test 1  Extinction/Neglect 0=No abnormality 1=visual/tactile/auditory/spatia/personal inattention/Extinction to bilateral simultaneous stimulation 2=Profound neglect/extinction more than 1 modality  0  Total   3      Imaging Reviewed:  CT  ANGIO HEAD NECK W WO CM (CODE STROKE) Result Date: 04/03/2024 EXAM: CTA HEAD AND NECK WITH AND WITHOUT 04/03/2024 03:39:21 PM TECHNIQUE: CTA of the head and neck was performed with and without the administration of intravenous contrast. Multiplanar 2D and/or 3D reformatted images are provided for review. Automated exposure control, iterative reconstruction, and/or weight based adjustment of the mA/kV was utilized to reduce the radiation dose to as low as reasonably achievable. Stenosis of the internal carotid arteries measured using NASCET criteria. COMPARISON: CT head earlier same day and on 03/24/2024. CLINICAL HISTORY: Stroke/TIA, determine embolic source. CODE STROKE; Dr. Jerri [id number] FINDINGS: CTA NECK: AORTIC ARCH AND ARCH VESSELS: Mild atherosclerosis of the aortic arch. Mixed atherosclerotic plaque at the origin of the left subclavian artery without significant stenosis. CERVICAL CAROTID ARTERIES: Mild calcified atherosclerosis of the right common carotid artery. Stent within the distal common carotid artery extending into the proximal right cervical ICA. The stent is patent. There is mild atherosclerosis along the left common carotid artery and at the left carotid bifurcation without hemodynamically significant stenosis. Irregularity and beaded appearance of the bilateral cervical ICAs suggestive of fibromuscular dysplasia. CERVICAL VERTEBRAL ARTERIES: Scattered atherosclerosis of the extracranial vertebral arteries without high-grade stenosis. LUNGS AND MEDIASTINUM: Central lobular emphysema in the visualized lung apices. SOFT TISSUES: No acute abnormality. BONES: Degenerative changes in the visualized spine with disc space narrowing most pronounced at C6-7. 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 Edentulous maxilla and mandible. Similar chronic deformity of the T3 superior endplate. CTA HEAD: ANTERIOR CIRCULATION: The intracranial internal carotid arteries are patent bilaterally. There is  mild atherosclerosis of the carotid siphons. The MCAs are patent bilaterally. The ACAs are patent bilaterally. 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 POSTERIOR CIRCULATION: No significant stenosis of the posterior cerebral arteries. No significant stenosis of the basilar artery. No significant stenosis of the vertebral arteries. No aneurysm. OTHER: No dural venous sinus thrombosis on this non-dedicated study. IMPRESSION: 1. No large vessel occlusion or hemodynamically significant stenosis in the head or neck. 2. Mild atherosclerosis of the aortic arch, right common carotid artery, and left common carotid artery/bifurcation without hemodynamically significant stenosis. 3. Patent stent within the distal right common carotid artery extending into the proximal right cervical ICA. 4. Irregularity and beaded appearance of the bilateral cervical ICAs suggestive of fibromuscular dysplasia. 5. Scattered atherosclerosis of the extracranial vertebral arteries without high-grade stenosis. Electronically signed by: Donnice Mania MD 04/03/2024 03:58 PM EDT RP Workstation: [id number]   CT HEAD CODE STROKE WO CONTRAST Result Date: 04/03/2024 EXAM: CT HEAD WITHOUT CONTRAST 04/03/2024 03:16:41 PM TECHNIQUE: CT of the head was performed without the administration of intravenous contrast. Automated exposure control, iterative reconstruction, and/or weight based adjustment of the mA/kV was utilized to reduce the radiation dose to as low as reasonably achievable. COMPARISON: CT of the head dated 03/21/2024. CLINICAL HISTORY: Neuro deficit, acute, stroke suspected. CODE STROKE; Dr. Deborrah [id number] FINDINGS: BRAIN AND VENTRICLES: No acute hemorrhage. Gray-white differentiation is preserved. No hydrocephalus. No extra-axial collection. No mass effect or midline shift. Age-related cerebral volume loss present. Mild periventricular white matter disease present. Aspect score is 10. ORBITS: No acute abnormality.  SINUSES: No acute abnormality. SOFT TISSUES AND SKULL: No acute soft tissue abnormality. No skull fracture. Moderate calcifications within the carotid siphons. IMPRESSION: 1. No acute intracranial abnormality. Aspects score is 10. 2. Age-related cerebral volume loss and mild periventricular white matter  disease. 3. The above findings were communicated with Dr. Jerri at 3:25 pm 04/03/2024. Electronically signed by: evalene coho 04/03/2024 03:28 PM EDT RP Workstation: [id number]     Labs reviewed in epic and pertinent values follow: Creatinine 1.01, platelet 134  Assessment:  78 yo F with hx of HTN, HLD, DM, CHF, carotid stenosis s/p right ICA stent, CAD, CKD 3, smoker, COPD presented to ED for not feeling well, generalized weakness, with difficulty speech. SBP was 80s, and symptoms improved with BP augmentation.  Last seen well 11 AM.  NIH score 3 with slight right facial droop, mild dysarthria and not orientated to months.  CT no acute finding. CTA head and neck no LVO, right ICA stent patent.  Patient not TNK candidate given right at 4.5-hour target and nondisabling symptoms.  Not IR candidate given no LVO.  Patient symptoms concerning for presyncope, cannot rule out stroke.  Recommend further stroke workup.   Recommendations:  Recommend hospitalist service admission/observation Continue further stroke work up  Frequent neuro checks Telemetry monitoring BP monitoring, avoid low BP MRI brain  Echocardiogram  UDS, fasting lipid panel and HgbA1C PT/OT/speech consult Permissive hypertension (only treat if BP > 220/120 unless a lower blood pressure is clinically necessary) for 24-48 hours post symptoms onset GI and DVT prophylaxis  Okay to continue home aspirin  and Plavix  tomorrow if passes swallow Stroke risk factor modification Discussed with Dr. Nicholaus ED physician We we will follow   Consult Participants: RN, stroke response RN, patient and me Location of the provider: [facility] Location of  the patient: [facility]  Time Code Stroke Page received:  1455 Time neurologist arrived:  1459 Time NIHSS completed: 1524    This consult was provided via telemedicine with 2-way video and audio communication. The patient/family was informed that care would be provided in this way and agreed to receive care in this manner.   This patient is receiving care for possible acute neurological changes. There was 60 minutes of care by this provider at the time of service, including time for direct evaluation via telemedicine, review of medical records, imaging studies and discussion of findings with providers, the patient and/or family.  Ary Jerri, MD PhD Stroke Neurology 04/03/2024 3:41 PM

## 2024-04-03 NOTE — ED Provider Notes (Signed)
 [facility] Provider Note    Event Date/Time   First MD Initiated Contact with Patient 04/03/24 [phone]     (approximate)   History   Code Stroke   HPI  Sarah White is a 78 y.o. female  emphysema, diabetes on  insulin , chronic kidney disease, unstable angina, non-small cell lung cancer, thrombocytopenia, dementia on midrinone who presents from a skilled nursing facility as a code stroke.  History is largely obtained by EMS given acuity of situation.  Patient was last seen normal by skilled nursing facility staff at 11 AM.  She returned back from an appointment (unclear what the appointment was) and there was concern that she was hypotensive and had slurred speech.  They administered her midronine shortly thereafter, and her slurred speech continued and consequently EMS was called.  Glucose en route was 141.  She wears 2 L of oxygen at baseline.  She takes Plavix .  There has been no reports of recent falls except for her ED visit on 44/7344 78 year old.  On arrival patient's states that she has no complaints.  Denies any headache hearing vision changes, sensation changes.  She tells me that her speech does not seem abnormal to her currently.  Telemetry neurology on the screen at bedside      Physical Exam   Triage Vital Signs: ED Triage Vitals  Encounter Vitals Group     BP      Girls Systolic BP Percentile      Girls Diastolic BP Percentile      Boys Systolic BP Percentile      Boys Diastolic BP Percentile      Pulse      Resp      Temp      Temp src      SpO2      Weight      Height      Head Circumference      Peak Flow      Pain Score      Pain Loc      Pain Education      Exclude from Growth Chart     Most recent vital signs: Vitals:   04/03/24 1543 04/03/24 1606  BP: (!) 143/52 (!) 128/54  Pulse: 81 69  Resp: 20 15  Temp: 97.7 F (36.5 C)   SpO2: 95% 97%    Nursing Triage Note reviewed. Vital signs reviewed and patients oxygen  saturation is normoxic on home 2L  General: Patient is well nourished, well developed, awake and alert, resting comfortably in no acute distress Head: Normocephalic and atraumatic Eyes: Normal inspection, extraocular muscles intact, no conjunctival pallor Ear, nose, throat: Normal external exam Neck: Normal range of motion Respiratory: Patient is in no respiratory distress, lungs CTAB Cardiovascular: Patient is not tachycardic, RRR without murmur appreciated GI: Abd SNT with no guarding or rebound  Back: Normal inspection of the back with good strength and range of motion throughout all ext Extremities: pulses intact with good cap refills, no LE pitting edema or calf tenderness Neuro: The patient is alert and oriented to person, place, and time, appropriately conversive, with 5/5 bilat UE/LE strength, no gross motor or sensory defects noted. Coordination appears to be adequate. Skin: Warm, dry, and intact Psych: normal mood and affect, no SI or HI  ED Results / Procedures / Treatments   Labs (all labs ordered are listed, but only abnormal results are displayed) Labs Reviewed  PROTIME-INR - Abnormal; Notable for the following  components:      Result Value   Prothrombin Time 16.2 (*)    All other components within normal limits  CBC - Abnormal; Notable for the following components:   Hemoglobin 11.9 (*)    RDW 16.7 (*)    Platelets 134 (*)    All other components within normal limits  COMPREHENSIVE METABOLIC PANEL WITH GFR - Abnormal; Notable for the following components:   Glucose, Bld 158 (*)    Creatinine, Ser 1.01 (*)    Total Protein 5.9 (*)    Albumin 3.1 (*)    AST 148 (*)    ALT 69 (*)    GFR, Estimated 58 (*)    All other components within normal limits  CBG MONITORING, ED - Abnormal; Notable for the following components:   Glucose-Capillary 155 (*)    All other components within normal limits  APTT  DIFFERENTIAL  ETHANOL  URINALYSIS, ROUTINE W REFLEX MICROSCOPIC   HEMOGLOBIN A1C  TSH  VITAMIN B12  VITAMIN D  25 HYDROXY (VIT D DEFICIENCY, FRACTURES)  MAGNESIUM  PHOSPHORUS  CK     EKG EKG and rhythm strip are interpreted by myself:   EKG: [Normal sinus rhythm] at heart rate of 69, wide QRS duration, QTc 527, nonspecific ST segments and T waves no ectopy EKG not consistent with Acute STEMI Rhythm strip: NSR in lead II Left bundle branch block unchanged from EKG of 03/20/2024   RADIOLOGY CT head: No acute abnormality on my independent review interpretation CTA head: No large vessel occlusion    PROCEDURES:  Critical Care performed: Yes, see critical care procedure note(s)  .Critical Care  Performed by: Nicholaus Rolland BRAVO, MD Authorized by: Nicholaus Rolland BRAVO, MD   Critical care provider statement:    Critical care time (minutes):  30   Critical care was necessary to treat or prevent imminent or life-threatening deterioration of the following conditions:  CNS failure or compromise   Critical care was time spent personally by me on the following activities:  Development of treatment plan with patient or surrogate, discussions with consultants, evaluation of patient's response to treatment, examination of patient, ordering and review of laboratory studies, ordering and review of radiographic studies, ordering and performing treatments and interventions, pulse oximetry, re-evaluation of patient's condition and review of old charts   Care discussed with: admitting provider   Comments:     Acute stroke alert with consideration of possible TNK and frequent reassessments    MEDICATIONS ORDERED IN ED: Medications   stroke: early stages of recovery book (has no administration in time range)  0.9 %  sodium chloride  infusion (has no administration in time range)  acetaminophen  (TYLENOL ) tablet 650 mg (has no administration in time range)    Or  acetaminophen  (TYLENOL ) 160 MG/5ML solution 650 mg (has no administration in time range)    Or   acetaminophen  (TYLENOL ) suppository 650 mg (has no administration in time range)  senna-docusate (Senokot-S) tablet 1 tablet (has no administration in time range)  enoxaparin  (LOVENOX ) injection 40 mg (has no administration in time range)  ondansetron  (ZOFRAN ) injection 4 mg (has no administration in time range)  rosuvastatin  (CRESTOR ) tablet 20 mg (has no administration in time range)  aspirin  EC tablet 81 mg (has no administration in time range)  pantoprazole  (PROTONIX ) EC tablet 40 mg (has no administration in time range)  clopidogrel  (PLAVIX ) tablet 75 mg (has no administration in time range)  fluticasone  furoate-vilanterol (BREO ELLIPTA ) 100-25 MCG/ACT 1 puff (has no administration in  time range)  traZODone  (DESYREL ) tablet 50 mg (has no administration in time range)  albuterol  (PROVENTIL ) (2.5 MG/3ML) 0.083% nebulizer solution 2.5 mg (has no administration in time range)  insulin  aspart (novoLOG ) injection 0-9 Units (has no administration in time range)  sodium chloride  flush (NS) 0.9 % injection 3 mL (3 mLs Intravenous Given 04/03/24 1605)  iohexol  (OMNIPAQUE ) 350 MG/ML injection 75 mL (75 mLs Intravenous Contrast Given 04/03/24 1534)     IMPRESSION / MDM / ASSESSMENT AND PLAN / ED COURSE                                Differential diagnosis includes, but is not limited to, acute CVA, intracranial hemorrhage, transient hypotension, metabolic encephalopathy   ED course: Patient arrives acutely as a acute stroke alert.  Both myself and stroke team at bedside immediately.  On my assessment NIH stroke scale of 0-1 (unclear whether to marked on for slurred speech) poor neurology they are marking for possible right lower leg weakness and right-sided facial droop arrival repeat assessment states that this NIH stroke scale was last.  CT head demonstrates no intracranial hemorrhage.  There is no large vessel occlusion.  Given minimal stroke scale and improvement in symptoms not a candidate for  TNK.  Stroke team does recommend MRI and echo and admission with reassessment in the morning.  Will discuss the case with hospitalist for admission   Clinical Course as of 04/03/24 1631  Mon Apr 03, 2024  1509 I met the patient as she wheeled into the emergency department.  Telestroke is on bedside.  I do not appreciate any focal deficits on my exam.  Patient's glucose is greater than 100 [HD]  1535 CT HEAD CODE STROKE WO CONTRAST No acute abnormality on CT head [HD]  1543 CBC(!) No leukocytosis or anemia [HD]  1548 Comprehensive metabolic panel(!) No profound electrolyte derangements [HD]  1548 Stroke team at bedside, no LVO recommend admission to the hospitalist for further CVA workup [HD]  1547-04-29 Per Stroke neurologist: probably slight right facial droop and slight dysarthria, too mild to treat with TNK and also time at 4.5 h, will not treat TNK anyway. CTA head and neck no LVO, some atherosclerosis b/l distal M2, no need IR. so she will probably need hosptialist admission and we will do MRI and echo, etc regular stroke work up and follow up in am.  [HD]    Clinical Course User Index [HD] Nicholaus Rolland BRAVO, MD   Data(2/3 categories following were performed): I reviewed or ordered at least three unique tests, external notes, and/or the history required an independent historian as one of the three requirements as following: At least 3 labs/imaging studies were obtained and/or reviewed. AND  I discussed the management of the patient with the following external physician or qualified healthcare provider: Admitting physician  Risk: This patient has a high risk of morbidity due to further diagnostic testing or treatment. Rationale: Decision made regarding admission  Admit Level 5 - Labs/Rads, Admit, Consult: Stroke Neurologist  Suggested E/M Coding Level: 5, 99285  This level has been selected based on the 2022/04/28 CPT guidelines for E/M codes in the Emergency Department based on 2/3 of the  CoPA, Data, and Risk.   FINAL CLINICAL IMPRESSION(S) / ED DIAGNOSES   Final diagnoses:  Slurred speech  Transient neurological symptoms  Prolonged Q-T interval on ECG     Rx / DC Orders  ED Discharge Orders     None        Note:  This document was prepared using Dragon voice recognition software and may include unintentional dictation errors.   Nicholaus Rolland BRAVO, MD 04/03/24 [phone]

## 2024-04-03 NOTE — H&P (Signed)
 [facility] History and Physical   Patient: Sarah White FMW:[id number]   PCP: Vicci Duwaine SQUIBB, DO DOB: Oct 31, 1945   DOA: 04/03/2024   DOS: 04/03/2024   DOS: the patient was seen and examined on 04/03/2024  Patient coming from: The patient is coming from SNF  Chief Complaint: Aphasia, strokelike symptoms  HPI: Sarah White is a 78 y.o. female with Past medical history of HTN, HLD, DM, chronic systolic CHF, carotid stenosis s/p right ICA stent, CAD, CKD 3a, smoker, COPD presented to ED for code stroke.  Last known normal around 11 AM, patient was noticed to have a low blood pressure at PCP office and neurological symptoms.  So code stroke was called and patient was sent to the [facility] ED. Patient was seen by teleneurologist, Patient was disoriented with right facial droop, right leg weakness, and dysarthria  on exam.  As per patient she has a chronic weakness in the right lower extremity, wheelchair-bound.  Currently patient is back to her baseline, denies any complaints.  ED Course: VS afebrile, HR 81, RR 20, BP 143/52, 95% on 3 L O2 BMP blood glucose 158, creatinine 1.01 slightly elevated rest within normal range Albumin 3.1 low, transaminitis, elevated AST and ALT. CT head: No hemorrhage or acute ischemic finding CTA head and neck: No large vessel occlusion, patent right ICA stent, no significant stenosis.    Review of Systems: as mentioned in the history of present illness.  All other systems reviewed and are negative.  Past Medical History:  Diagnosis Date   Acute pancreatitis 09/09/2018   Acute respiratory failure with hypoxia and hypercarbia (HCC) 07/22/2015   Cancer (HCC)    uterine   CHF (congestive heart failure) (HCC)    COPD (chronic obstructive pulmonary disease) (HCC)    Diabetes mellitus without complication (HCC)    Encephalopathy acute 07/22/2015   Gastric ulcer without hemorrhage or perforation    Hypertension    Pneumonia    November 2016   Pulmonary edema  07/22/2015   Squamous cell cancer of skin of upper arm, right    Past Surgical History:  Procedure Laterality Date   CAROTID PTA/STENT INTERVENTION Right 03/06/2020   Procedure: CAROTID PTA/STENT INTERVENTION;  Surgeon: Jama Cordella MATSU, MD;  Location: [facility] INVASIVE CV LAB;  Service: Cardiovascular;  Laterality: Right;   CARPAL TUNNEL RELEASE Bilateral    CESAREAN SECTION     CHOLECYSTECTOMY N/A 09/12/2018   Procedure: LAPAROSCOPIC CHOLECYSTECTOMY WITH INTRAOPERATIVE CHOLANGIOGRAM;  Surgeon: Rodolph Romano, MD;  Location: [facility] ORS;  Service: General;  Laterality: N/A;   COLONOSCOPY WITH PROPOFOL  N/A 09/10/2021   Procedure: COLONOSCOPY WITH PROPOFOL ;  Surgeon: Unk Corinn Skiff, MD;  Location: [facility] ENDOSCOPY;  Service: Gastroenterology;  Laterality: N/A;   CORONARY ANGIOPLASTY WITH STENT PLACEMENT     ESOPHAGOGASTRODUODENOSCOPY N/A 09/10/2021   Procedure: ESOPHAGOGASTRODUODENOSCOPY (EGD);  Surgeon: Unk Corinn Skiff, MD;  Location: [facility] ENDOSCOPY;  Service: Gastroenterology;  Laterality: N/A;   HAND SURGERY Bilateral    KNEE SURGERY Left    LEFT HEART CATH AND CORONARY ANGIOGRAPHY Right 11/26/2016   Procedure: Left Heart Cath and Coronary Angiography;  Surgeon: Marsa Dooms, MD;  Location: [facility] INVASIVE CV LAB;  Service: Cardiovascular;  Laterality: Right;   LEFT HEART CATH AND CORONARY ANGIOGRAPHY N/A 01/23/2020   Procedure: LEFT HEART CATH AND CORONARY ANGIOGRAPHY;  Surgeon: Florencio Cara BIRCH, MD;  Location: [facility] INVASIVE CV LAB;  Service: Cardiovascular;  Laterality: N/A;   TOTAL ABDOMINAL HYSTERECTOMY     Social History:  reports  that she has been smoking cigarettes. She started smoking about 65 years ago. She has a 65.6 pack-year smoking history. She has never used smokeless tobacco. She reports that she does not currently use alcohol . She reports that she does not use drugs.  Allergies  Allergen Reactions   Amlodipine  Swelling   Atorvastatin  Other (See Comments)     Myalgia    Codeine Nausea And Vomiting   Hydrochlorothiazide  Other (See Comments)    Causes Low Potassium   Lisinopril  Other (See Comments)    Causes dizziness   Metformin  And Related Nausea Only and Other (See Comments)    Causes dizziness   Amlodipine  Swelling   Atorvastatin  Other (See Comments)    myalgia   Codeine Nausea And Vomiting   Hctz [Hydrochlorothiazide ] Other (See Comments)    Hypokalemia   Lisinopril  Other (See Comments)    Dizziness    Metformin  And Related Nausea Only    Nausea, dizziness     Family history reviewed and not pertinent Family History  Problem Relation Age of Onset   Heart disease Mother      Prior to Admission medications   Medication Sig Start Date End Date Taking? Authorizing Provider  albuterol  (VENTOLIN  HFA) 108 (90 Base) MCG/ACT inhaler Inhale 2 puffs into the lungs every 6 (six) hours as needed for wheezing or shortness of breath. Patient not taking: Reported on 01/21/2024 09/20/23   Vicci Bouchard P, DO  albuterol  (VENTOLIN  HFA) 108 (90 Base) MCG/ACT inhaler Inhale 2 puffs into the lungs every 6 (six) hours as needed for wheezing or shortness of breath. 09/20/23   [provider]  aspirin  EC 81 MG tablet Take 81 mg by mouth at bedtime.     [provider]  bisacodyl  (DULCOLAX) 10 MG suppository Place 1 suppository (10 mg total) rectally daily as needed (no BM in over 2 days). 02/15/24   Fausto Burnard LABOR, DO  buPROPion  (WELLBUTRIN  XL) 300 MG 24 hr tablet Take 1 tablet (300 mg total) by mouth daily. 01/21/24   Vicci Bouchard P, DO  buPROPion  (WELLBUTRIN  XL) 300 MG 24 hr tablet Take 300 mg by mouth daily.    [provider]  carvedilol  (COREG ) 12.5 MG tablet Take 1 tablet (12.5 mg total) by mouth 2 (two) times daily with a meal. 01/21/24   Johnson, [name], DO  carvedilol  (COREG ) 12.5 MG tablet Take 12.5 mg by mouth 2 (two) times daily. 01/21/24   [provider]  clopidogrel  (PLAVIX ) 75 MG tablet Take 1  tablet (75 mg total) by mouth daily at 6 (six) AM. 01/21/24   Vicci, [name], DO  clopidogrel  (PLAVIX ) 75 MG tablet Take 75 mg by mouth every morning. 01/21/24   [provider]  cyclobenzaprine  (FLEXERIL ) 10 MG tablet Take 1 tablet by mouth three times daily as needed for muscle spasm Patient not taking: Reported on 01/21/2024 02/02/23   Vicci Bouchard P, DO  dexamethasone  (DECADRON ) 2 MG tablet Take 1 tablet (2 mg total) by mouth 2 (two) times daily with a meal. 12/27/23   Lenn Aran, MD  diphenhydrAMINE  (BENADRYL ) 25 MG tablet Take 25 mg by mouth at bedtime as needed for allergies. Dies not take regularly    [provider]  furosemide  (LASIX ) 40 MG tablet Take 1 tablet (40 mg total) by mouth daily. 01/21/24   Johnson, [name], DO  furosemide  (LASIX ) 40 MG tablet Take 40 mg by mouth daily. 01/21/24   [provider]  Glycopyrrolate-Formoterol  (BEVESPI AEROSPHERE) 9-4.8  MCG/ACT AERO Inhale 2 puffs into the lungs 2 (two) times daily.    [provider]  guaiFENesin  (MUCINEX ) 600 MG 12 hr tablet Take 1 tablet (600 mg total) by mouth 2 (two) times daily as needed for cough or to loosen phlegm. 02/15/24   Fausto Burnard LABOR, DO  insulin  aspart (NOVOLOG ) 100 UNIT/ML injection Inject 0-9 Units into the skin 3 (three) times daily with meals. 02/15/24   Fausto Burnard A, DO  lidocaine  (LIDODERM ) 5 % Place 1 patch onto the skin daily. Remove & Discard patch within 12 hours or as directed by MD 03/27/22   Vicci Bouchard P, DO  melatonin 5 MG TABS Take 1 tablet (5 mg total) by mouth at bedtime. 02/15/24   Fausto Burnard LABOR, DO  mometasone -formoterol  (DULERA ) 100-5 MCG/ACT AERO Inhale 2 puffs into the lungs 2 (two) times daily. 02/15/24   Fausto Burnard A, DO  nitroGLYCERIN  (NITROSTAT ) 0.4 MG SL tablet DISSOLVE ONE TABLET UNDER THE TONGUE EVERY 5 MINUTES AS NEEDED FOR CHEST PAIN.  DO NOT EXCEED A TOTAL OF 3 DOSES IN 15 MINUTES 02/23/22   Johnson, [name], DO  nitroGLYCERIN   (NITROSTAT ) 0.4 MG SL tablet Place 1 tablet (0.4 mg total) under the tongue every 5 (five) minutes as needed for chest pain. 12/31/23   Johnson, [name], DO  nitroGLYCERIN  (NITROSTAT ) 0.4 MG SL tablet Place 0.4 mg under the tongue every 5 (five) minutes as needed for chest pain. 12/31/23   [provider]  ondansetron  (ZOFRAN ) 8 MG tablet Take 1 tablet (8 mg total) by mouth every 8 (eight) hours as needed for nausea or vomiting. 01/21/24   Vicci Bouchard P, DO  ondansetron  (ZOFRAN ) 8 MG tablet Take 8 mg by mouth every 8 (eight) hours as needed. 01/21/24   [provider]  pantoprazole  (PROTONIX ) 40 MG tablet Take 1 tablet (40 mg total) by mouth 2 (two) times daily before a meal. 01/21/24   Johnson, [name], DO  pantoprazole  (PROTONIX ) 40 MG tablet Take 40 mg by mouth 2 (two) times daily. 01/21/24   [provider]  polyethylene glycol (MIRALAX  / GLYCOLAX ) 17 g packet Take 17 g by mouth daily. Hold for loose or frequent stools. 02/15/24   Fausto Burnard LABOR, DO  promethazine  (PHENERGAN ) 25 MG suppository Place 1 suppository (25 mg total) rectally every 6 (six) hours as needed for nausea or vomiting. 05/28/22   Vicci Bouchard P, DO  QUEtiapine  (SEROQUEL  XR) 50 MG TB24 24 hr tablet Take 2 tablets (100 mg total) by mouth at bedtime. 01/21/24   Vicci Bouchard P, DO  QUEtiapine  (SEROQUEL  XR) 50 MG TB24 24 hr tablet Take 100 mg by mouth at bedtime. 01/28/24   [provider]  rosuvastatin  (CRESTOR ) 20 MG tablet Take 1 tablet (20 mg total) by mouth daily. 01/21/24   Johnson, [name], DO  rosuvastatin  (CRESTOR ) 20 MG tablet Take 20 mg by mouth daily. 01/21/24   [provider]  sacubitril -valsartan  (ENTRESTO ) 49-51 MG Take 1 tablet by mouth 2 (two) times daily.    [provider]  senna-docusate (SENOKOT-S) 8.6-50 MG tablet Take 1 tablet by mouth 2 (two) times daily. Hold for loose or frequent stools. 02/15/24   Fausto Burnard A, DO  sertraline  (ZOLOFT ) 100 MG tablet Take  2 tablets (200 mg total) by mouth daily. 01/21/24   Johnson, [name], DO  sertraline  (ZOLOFT ) 100 MG tablet Take 200 mg by mouth daily. 01/21/24   [provider]  traZODone  (DESYREL ) 50  MG tablet Take 1 tablet (50 mg total) by mouth at bedtime as needed. for sleep 01/21/24   Vicci Bouchard P, DO  traZODone  (DESYREL ) 50 MG tablet Take 50 mg by mouth at bedtime as needed. 12/09/23   [provider]  triamcinolone  ointment (KENALOG ) 0.5 % Apply 1 application topically 2 (two) times daily. 08/21/19   Johnson, [name], DO  umeclidinium bromide  (INCRUSE ELLIPTA ) 62.5 MCG/ACT AEPB Inhale 1 puff into the lungs daily. 02/15/24   Fausto Burnard LABOR, DO    Physical Exam: Vitals:   04/03/24 1535 04/03/24 1543 04/03/24 1601 04/03/24 1606  BP:  (!) 143/52  (!) 128/54  Pulse:  81  69  Resp:  20  15  Temp:  97.7 F (36.5 C)    TempSrc:  Oral    SpO2:  95%  97%  Weight:   63.7 kg   Height: 5' 4 (1.626 m)  5' 4 (1.626 m)     General: alert and oriented to time, place, and person. Appear in mild distress, affect appropriate Eyes: PERRLA, Conjunctiva normal ENT: Oral Mucosa Clear, moist  Neck: no JVD, no Abnormal Mass Or lumps Cardiovascular: S1 and S2 Present, no Murmur, peripheral pulses symmetrical Respiratory: good respiratory effort, Bilateral Air entry equal and Decreased, no signs of accessory muscle use, Clear to Auscultation, no Crackles, no wheezes Abdomen: Bowel Sound present, Soft and no tenderness, no hernia Skin: no rashes  Extremities: no Pedal edema, no calf tenderness Neurologic: without any new focal findings, chronic right lower extremity weakness power 4/5 Gait not checked due to patient safety concerns  Data Reviewed: I have personally reviewed and interpreted labs, imaging as discussed below.  CBC: Recent Labs  Lab 04/03/24 1518  WBC 5.3  NEUTROABS 4.0  HGB 11.9*  HCT 38.2  MCV 86.4  PLT 134*   Basic Metabolic Panel: Recent Labs  Lab 04/03/24 1518   NA 138  K 4.0  CL 98  CO2 25  GLUCOSE 158*  BUN 17  CREATININE 1.01*  CALCIUM  9.1   GFR: Estimated Creatinine Clearance: 41.6 mL/min (A) (by C-G formula based on SCr of 1.01 mg/dL (H)). Liver Function Tests: Recent Labs  Lab 04/03/24 1518  AST 148*  ALT 69*  ALKPHOS 77  BILITOT 0.7  PROT 5.9*  ALBUMIN 3.1*   No results for input(s): LIPASE, AMYLASE in the last 168 hours. No results for input(s): AMMONIA in the last 168 hours. Coagulation Profile: Recent Labs  Lab 04/03/24 1518  INR 1.2   Cardiac Enzymes: No results for input(s): CKTOTAL, CKMB, CKMBINDEX, TROPONINI in the last 168 hours. BNP (last 3 results) No results for input(s): PROBNP in the last 8760 hours. HbA1C: No results for input(s): HGBA1C in the last 72 hours. CBG: Recent Labs  Lab 04/03/24 1506  GLUCAP 155*   Lipid Profile: No results for input(s): CHOL, HDL, LDLCALC, TRIG, CHOLHDL, LDLDIRECT in the last 72 hours. Thyroid  Function Tests: No results for input(s): TSH, T4TOTAL, FREET4, T3FREE, THYROIDAB in the last 72 hours. Anemia Panel: No results for input(s): VITAMINB12, FOLATE, FERRITIN, TIBC, IRON, RETICCTPCT in the last 72 hours. Urine analysis:    Component Value Date/Time   COLORURINE AMBER (A) 02/05/2024 1345   APPEARANCEUR CLOUDY (A) 02/05/2024 1345   APPEARANCEUR Cloudy (A) 06/23/2021 1514   LABSPEC 1.027 02/05/2024 1345   LABSPEC 1.028 01/15/2014 1706   PHURINE 6.0 02/05/2024 1345   GLUCOSEU NEGATIVE 02/05/2024 1345   GLUCOSEU >=500 01/15/2014 1706   HGBUR SMALL (A) 02/05/2024  1345   BILIRUBINUR NEGATIVE 02/05/2024 1345   BILIRUBINUR Negative 06/23/2021 1514   BILIRUBINUR Negative 01/15/2014 1706   KETONESUR 20 (A) 02/05/2024 1345   PROTEINUR 100 (A) 02/05/2024 1345   NITRITE NEGATIVE 02/05/2024 1345   LEUKOCYTESUR NEGATIVE 02/05/2024 1345   LEUKOCYTESUR Negative 01/15/2014 1706    Radiological Exams on Admission: CT  ANGIO HEAD NECK W WO CM (CODE STROKE) Result Date: 04/03/2024 EXAM: CTA HEAD AND NECK WITH AND WITHOUT 04/03/2024 03:39:21 PM TECHNIQUE: CTA of the head and neck was performed with and without the administration of intravenous contrast. Multiplanar 2D and/or 3D reformatted images are provided for review. Automated exposure control, iterative reconstruction, and/or weight based adjustment of the mA/kV was utilized to reduce the radiation dose to as low as reasonably achievable. Stenosis of the internal carotid arteries measured using NASCET criteria. COMPARISON: CT head earlier same day and on 03/24/2024. CLINICAL HISTORY: Stroke/TIA, determine embolic source. CODE STROKE; Dr. Jerri [id number] FINDINGS: CTA NECK: AORTIC ARCH AND ARCH VESSELS: Mild atherosclerosis of the aortic arch. Mixed atherosclerotic plaque at the origin of the left subclavian artery without significant stenosis. CERVICAL CAROTID ARTERIES: Mild calcified atherosclerosis of the right common carotid artery. Stent within the distal common carotid artery extending into the proximal right cervical ICA. The stent is patent. There is mild atherosclerosis along the left common carotid artery and at the left carotid bifurcation without hemodynamically significant stenosis. Irregularity and beaded appearance of the bilateral cervical ICAs suggestive of fibromuscular dysplasia. CERVICAL VERTEBRAL ARTERIES: Scattered atherosclerosis of the extracranial vertebral arteries without high-grade stenosis. LUNGS AND MEDIASTINUM: Central lobular emphysema in the visualized lung apices. SOFT TISSUES: No acute abnormality. BONES: Degenerative changes in the visualized spine with disc space narrowing most pronounced at C6-7. 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 Edentulous maxilla and mandible. Similar chronic deformity of the T3 superior endplate. CTA HEAD: ANTERIOR CIRCULATION: The intracranial internal carotid arteries are patent bilaterally. There is  mild atherosclerosis of the carotid siphons. The MCAs are patent bilaterally. The ACAs are patent bilaterally. 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 POSTERIOR CIRCULATION: No significant stenosis of the posterior cerebral arteries. No significant stenosis of the basilar artery. No significant stenosis of the vertebral arteries. No aneurysm. OTHER: No dural venous sinus thrombosis on this non-dedicated study. IMPRESSION: 1. No large vessel occlusion or hemodynamically significant stenosis in the head or neck. 2. Mild atherosclerosis of the aortic arch, right common carotid artery, and left common carotid artery/bifurcation without hemodynamically significant stenosis. 3. Patent stent within the distal right common carotid artery extending into the proximal right cervical ICA. 4. Irregularity and beaded appearance of the bilateral cervical ICAs suggestive of fibromuscular dysplasia. 5. Scattered atherosclerosis of the extracranial vertebral arteries without high-grade stenosis. Electronically signed by: Donnice Mania MD 04/03/2024 03:58 PM EDT RP Workstation: [id number]   CT HEAD CODE STROKE WO CONTRAST Result Date: 04/03/2024 EXAM: CT HEAD WITHOUT CONTRAST 04/03/2024 03:16:41 PM TECHNIQUE: CT of the head was performed without the administration of intravenous contrast. Automated exposure control, iterative reconstruction, and/or weight based adjustment of the mA/kV was utilized to reduce the radiation dose to as low as reasonably achievable. COMPARISON: CT of the head dated 03/21/2024. CLINICAL HISTORY: Neuro deficit, acute, stroke suspected. CODE STROKE; Dr. Deborrah [id number] FINDINGS: BRAIN AND VENTRICLES: No acute hemorrhage. Gray-white differentiation is preserved. No hydrocephalus. No extra-axial collection. No mass effect or midline shift. Age-related cerebral volume loss present. Mild periventricular white matter disease present. Aspect score is 10. ORBITS:  No acute abnormality.  SINUSES: No acute abnormality. SOFT TISSUES AND SKULL: No acute soft tissue abnormality. No skull fracture. Moderate calcifications within the carotid siphons. IMPRESSION: 1. No acute intracranial abnormality. Aspects score is 10. 2. Age-related cerebral volume loss and mild periventricular white matter disease. 3. The above findings were communicated with Dr. Jerri at 3:25 pm 04/03/2024. Electronically signed by: evalene coho 04/03/2024 03:28 PM EDT RP Workstation: [id number]   EKG: Independently reviewed. normal EKG, normal sinus rhythm. Echocardiogram: 02/10/24: LVEF 35 to 40%, negative PFO, no any other significant findings.  I reviewed all nursing notes, pharmacy notes, vitals, pertinent old records.  Assessment/Plan Principal Problem:   Stroke-like symptoms   # Stroke-like symptoms Continue monitor on telemetry Continue neurocheck as per protocol Resumed aspirin  and Plavix  home dose, resumed his statin PT/OT/SLP eval Continue fall precaution and aspiration precautions Turn patient every 2 hourly Follow MRI brain Follow TSH and A1c LDL 76 2 months ago Follow neurology consult tomorrow a.m.   Chronic systolic CHF, HTN, HLD, CAD, s/p right carotid stent Continued aspirin , Plavix  and statin Held antihypertensive medications for now Allow permissive hypertension Monitor BP and titrate medication accordingly   # IDDM T2 Started NovoLog  sliding scale Monitor CBG Start carb modified diet after SLP eval Patient failed swallow screen as per RN  # COPD, no exacerbation noticed Continue inhalers  Nutrition: NPO  DVT Prophylaxis: Subcutaneous Lovenox   Advance goals of care discussion: DNR/DNI-limited  Consults: I personally Discussed with neurology.  Patient will be seen tomorrow a.m.  Family Communication: family was present at bedside, at the time of interview.  Opportunity was given to ask question and all questions were answered satisfactorily.  Disposition: Admitted as  observation, telemetry, stroke unit. Likely to be discharged to SNF, in 1-2 days, when cleared by neurology.  I have discussed plan of care as described above with RN and patient/family.  Severity of Illness: The appropriate patient status for this patient is OBSERVATION. Observation status is judged to be reasonable and necessary in order to provide the required intensity of service to ensure the patient's safety. The patient's presenting symptoms, physical exam findings, and initial radiographic and laboratory data in the context of their medical condition is felt to place them at decreased risk for further clinical deterioration. Furthermore, it is anticipated that the patient will be medically stable for discharge from the hospital within 2 midnights of admission.    Author: ELVAN SOR, MD [facility] 04/03/2024 4:28 PM   To reach On-call, see care teams to locate the attending and reach out to them via [email or web address]. If 7PM-7AM, please contact night-coverage If you still have difficulty reaching the attending provider, please page the [facility] (Director on Call) for [facility] on amion for assistance.

## 2024-04-03 NOTE — ED Triage Notes (Signed)
 LNW was 1100 today with c/o of asphasia by staff at facility. Code stroke called. MD at bedside.  CBG 155

## 2024-04-03 NOTE — Code Documentation (Signed)
 Stroke Response Nurse Documentation Code Documentation  Sarah White is a 78 y.o. female arriving to [facility] via [facility] on 04/03/2024 with past medical hx of DM, CHF, pulmonary edema, encephalopathy, HTN On aspirin  81 mg daily and clopidogrel  75 mg daily. Code stroke was activated by EMS.   [facility] from [facility] where she was LKW at 1100 and now complaining of slurred speech. Per EMS, patient was at an appointment earlier in her normal state, she came back and was noted by staff to have generalized weakness, not acting like her self, and low BP. SBP in the 80s. Staff gave her midodrine, which improved her generalized weakness, but slurred speech maintained. EMS called.   Stroke team at the bedside on patient arrival. Labs drawn and patient cleared for CT by Dr. Nicholaus. Patient to CT with team. NIHSS 4, see documentation for details and code stroke times. Patient with disoriented, right facial droop, right leg weakness, and dysarthria  on exam. The following imaging was completed:  CT Head and CTA. Patient is not a candidate for IV Thrombolytic due to outside window, per MD. Patient is not a candidate for IR due to no LVO, per MD.   Care Plan: every 2 hour NIHSS, vital signs. Swallow screen per protocol   Process Delays Noted: none  Bedside handoff with ED RN Arlyne F & Olam JUDITHANN Burnard KANDICE Hershel  Stroke Response RN

## 2024-04-04 DIAGNOSIS — R29818 Other symptoms and signs involving the nervous system: Secondary | ICD-10-CM | POA: Diagnosis not present

## 2024-04-04 DIAGNOSIS — R55 Syncope and collapse: Secondary | ICD-10-CM | POA: Diagnosis not present

## 2024-04-04 DIAGNOSIS — R2990 Unspecified symptoms and signs involving the nervous system: Secondary | ICD-10-CM | POA: Diagnosis not present

## 2024-04-04 LAB — CBC
HCT: 36.6 % (ref 36.0–46.0)
Hemoglobin: 11.7 g/dL — ABNORMAL LOW (ref 12.0–15.0)
MCH: 27.3 pg (ref 26.0–34.0)
MCHC: 32 g/dL (ref 30.0–36.0)
MCV: 85.5 fL (ref 80.0–100.0)
Platelets: 118 K/uL — ABNORMAL LOW (ref 150–400)
RBC: 4.28 MIL/uL (ref 3.87–5.11)
RDW: 16.8 % — ABNORMAL HIGH (ref 11.5–15.5)
WBC: 4.7 K/uL (ref 4.0–10.5)
nRBC: 0 % (ref 0.0–0.2)

## 2024-04-04 LAB — BASIC METABOLIC PANEL WITH GFR
Anion gap: 11 (ref 5–15)
BUN: 17 mg/dL (ref 8–23)
CO2: 26 mmol/L (ref 22–32)
Calcium: 9 mg/dL (ref 8.9–10.3)
Chloride: 101 mmol/L (ref 98–111)
Creatinine, Ser: 0.9 mg/dL (ref 0.44–1.00)
GFR, Estimated: >60 mL/min (ref >60)
Glucose, Bld: 74 mg/dL (ref 70–99)
Potassium: 3.7 mmol/L (ref 3.5–5.1)
Sodium: 138 mmol/L (ref 135–145)

## 2024-04-04 LAB — PHOSPHORUS: Phosphorus: 3.7 mg/dL (ref 2.5–4.6)

## 2024-04-04 LAB — HEMOGLOBIN A1C
Hgb A1c MFr Bld: 5.4 % (ref 4.8–5.6)
Mean Plasma Glucose: 108 mg/dL

## 2024-04-04 LAB — HEPATIC FUNCTION PANEL
ALT: 65 U/L — ABNORMAL HIGH (ref 0–44)
AST: 128 U/L — ABNORMAL HIGH (ref 15–41)
Albumin: 3 g/dL — ABNORMAL LOW (ref 3.5–5.0)
Alkaline Phosphatase: 71 U/L (ref 38–126)
Bilirubin, Direct: 0.1 mg/dL (ref 0.0–0.2)
Indirect Bilirubin: 0.7 mg/dL (ref 0.3–0.9)
Total Bilirubin: 0.8 mg/dL (ref 0.0–1.2)
Total Protein: 5.8 g/dL — ABNORMAL LOW (ref 6.5–8.1)

## 2024-04-04 LAB — MAGNESIUM: Magnesium: 2 mg/dL (ref 1.7–2.4)

## 2024-04-04 LAB — GLUCOSE, CAPILLARY
Glucose-Capillary: 95 mg/dL (ref 70–99)
Glucose-Capillary: 146 mg/dL — ABNORMAL HIGH (ref 70–99)
Glucose-Capillary: 71 mg/dL (ref 70–99)

## 2024-04-04 MED ORDER — CYANOCOBALAMIN 1000 MCG/ML IJ SOLN: 1000.0000 ug | Freq: Every day | INTRAMUSCULAR | Status: AC

## 2024-04-04 MED ORDER — VITAMIN D (ERGOCALCIFEROL) 1.25 MG (50000 UNIT) PO CAPS
50000.0000 [IU] | ORAL_CAPSULE | ORAL | Status: DC
  Administered 2024-04-04: 50000 [IU] via ORAL
  Filled 2024-04-04: qty 1

## 2024-04-04 MED ORDER — CYANOCOBALAMIN 1000 MCG PO TABS: 1000.0000 ug | ORAL_TABLET | Freq: Every day | ORAL | Status: AC

## 2024-04-04 MED ORDER — VITAMIN B-12 1000 MCG PO TABS: 1000.0000 ug | ORAL_TABLET | Freq: Every day | ORAL | Status: DC

## 2024-04-04 MED ORDER — CARVEDILOL 3.125 MG PO TABS: 3.1250 mg | ORAL_TABLET | Freq: Two times a day (BID) | ORAL | Status: DC

## 2024-04-04 MED ORDER — FUROSEMIDE 40 MG PO TABS: 20.0000 mg | ORAL_TABLET | Freq: Every day | ORAL | Status: DC

## 2024-04-04 MED ORDER — CYANOCOBALAMIN 1000 MCG/ML IJ SOLN
1000.0000 ug | Freq: Every day | INTRAMUSCULAR | Status: DC
  Administered 2024-04-04: 1000 ug via INTRAMUSCULAR
  Filled 2024-04-04: qty 1

## 2024-04-04 MED ORDER — VITAMIN D (ERGOCALCIFEROL) 1.25 MG (50000 UNIT) PO CAPS: 50000.0000 [IU] | ORAL_CAPSULE | ORAL | Status: AC

## 2024-04-04 NOTE — Care Management Obs Status (Signed)
 MEDICARE OBSERVATION STATUS NOTIFICATION   Patient Details  Name: Sarah White MRN: [id number] Date of Birth: 12/25/1945   Medicare Observation Status Notification Given:  Yes    Daniil Labarge W, CMA 04/04/2024, 9:55 AM

## 2024-04-04 NOTE — Evaluation (Addendum)
 Clinical/Bedside Swallow Evaluation Patient Details  Name: Sarah White MRN: [id number] Date of Birth: Mar 17, 1946  Today's Date: 04/04/2024 Time: SLP Start Time (ACUTE ONLY): 1005 SLP Stop Time (ACUTE ONLY): 1105 SLP Time Calculation (min) (ACUTE ONLY): 60 min  Past Medical History:  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, right    Past Surgical History:  Past Surgical History:  Procedure Laterality Date   CAROTID PTA/STENT INTERVENTION Right 03/06/2020   Procedure: CAROTID PTA/STENT INTERVENTION;  Surgeon: Jama Cordella MATSU, MD;  Location: [facility] INVASIVE CV LAB;  Service: Cardiovascular;  Laterality: Right;   CARPAL TUNNEL RELEASE Bilateral    CESAREAN SECTION     CHOLECYSTECTOMY N/A 09/12/2018   Procedure: LAPAROSCOPIC CHOLECYSTECTOMY WITH INTRAOPERATIVE CHOLANGIOGRAM;  Surgeon: Rodolph Romano, MD;  Location: [facility] ORS;  Service: General;  Laterality: N/A;   COLONOSCOPY WITH PROPOFOL  N/A 09/10/2021   Procedure: COLONOSCOPY WITH PROPOFOL ;  Surgeon: Unk Corinn Skiff, MD;  Location: [facility] ENDOSCOPY;  Service: Gastroenterology;  Laterality: N/A;   CORONARY ANGIOPLASTY WITH STENT PLACEMENT     ESOPHAGOGASTRODUODENOSCOPY N/A 09/10/2021   Procedure: ESOPHAGOGASTRODUODENOSCOPY (EGD);  Surgeon: Unk Corinn Skiff, MD;  Location: [facility] ENDOSCOPY;  Service: Gastroenterology;  Laterality: N/A;   HAND SURGERY Bilateral    KNEE SURGERY Left    LEFT HEART CATH AND CORONARY ANGIOGRAPHY Right 11/26/2016   Procedure:  Left Heart Cath and Coronary Angiography;  Surgeon: Marsa Dooms, MD;  Location: [facility] INVASIVE CV LAB;  Service: Cardiovascular;  Laterality: Right;   LEFT HEART CATH AND CORONARY ANGIOGRAPHY N/A 01/23/2020   Procedure: LEFT HEART CATH AND CORONARY ANGIOGRAPHY;  Surgeon: Florencio Cara BIRCH, MD;  Location: [facility] INVASIVE CV LAB;  Service: Cardiovascular;  Laterality: N/A;   TOTAL ABDOMINAL HYSTERECTOMY     HPI:  Pt is a 78 y.o. female with Past medical history of HTN, HLD, DM, chronic systolic CHF, carotid Stenosis s/p right ICA stent, CAD, CKD 3a, smoker, COPD, HFrEF with last EF 40%, mitral regurgitation, unsure of pt's Baseline Cognitive functioning, Falls, who presented to ED for concern for code stroke.  OF NOTE: pt has Non-small cell lung cancer s/p SBRT per chart.  Last known normal around 11 AM, patient was noticed to have a low blood pressure at PCP office and neurological symptoms.  So code stroke was called and patient was [facility] ED. Pt was disoriented with right facial droop, right leg weakness, and dysarthria on exam.  As per patient, she has a chronic weakness in the right lower extremity, wheelchair-bound and wears Dentures.  Currently patient is back to her baseline, denies any complaints per ED chart notes.    MRI: No acute intracranial abnormality.  2. Multifocal hyperintense T2-weighted signal within the cerebral white matter,  most commonly due to chronic small vessel disease.  3. Generalized volume loss.   No chest imaging this admit; recent chest imaging last admit 01/2024: CT Chest: 1. New irregular consolidation and patchy ground-glass opacities  within the dependent right upper lobe,  middle lobe, and right lower  lobe with air bronchograms, likely multifocal pneumonia. Possible  underlying posttreatment change in the setting of radiation  treatment.  2. Interval decreased size of the spiculated medial right upper lobe  nodule, consistent with treatment response.  3.  New subcarinal lymphadenopathy, indeterminate.  4. Small right pleural effusion..    Assessment / Plan / Recommendation  Clinical Impression   Pt seen for BSE today. Pt awake, verbally responded. Noted apparent Cognitive decline(min) in her insight/responses and follow through w/ instructions. Pt was able to feed self given Full setup and support w/ po's. Pt is from a Facility. On 2-3L  O2 support; afebrile. WBC WNL.  Pt appears to present w/ grossly functional oropharyngeal phase swallowing w/ No significant, overt oropharyngeal phase dysphagia noted, No neuromuscular deficits noted. Pt consumed po trials w/ No overt, clinical s/s of aspiration during po trials.  Pt appears at reduced risk for aspiration when following general aspiration precautions w/ a slightly modified diet of easy to chew/eat foods, moistened well. However, pt does have challenging factors that could impact her oropharyngeal swallowing to include deconditioning/weakness, Lung Ca s/p txs, Dentures, and unsure of pt's baseline Cognitive insight/awareness. These factors can increase risk for dysphagia as well as decreased oral intake overall.   During po trials, pt consumed all consistencies w/ no overt coughing, decline in vocal quality, or change in respiratory presentation during/post trials. O2 sats remained 98%. Oral phase appeared grossly [facility] w/ timely bolus management and control of bolus propulsion for A-P transfer for swallowing. Mastication time w/ increased textures was min increased but grossly WFL- moistened foods well for ease of chewing. Oral clearing achieved w/ all trial consistencies. Encouraged pt to alternate dry/wet foods w/ small sips of liquids. OM Exam appeared [facility] w/ No unilateral weakness noted. Speech intelligible but somewhat mumbled- pt wear Dentures. Pt fed self w/ setup support.   Recommend a more Mech Soft consistency diet w/ well-Cut meats, moistened foods; Thin liquids -- CUP drinking. Pt to Hold  Cup when drinking. Recommend general aspiration precautions, small bites/sips slowly. Time b/t bites and sips to aid oral clearing. Reduce distractions during meals. Pills CRUSHED vs WHOLE in Puree for safer, easier swallowing- recommended for D/C also. Tray setup and sitting up support; Denture care/use for eating solids. Education given on Pills in Puree; food consistencies and easy to eat options; general aspiration precautions to pt and NSG.  MD/NSG to reconsult if any new needs arise. NSG updated, agreed. MD updated. Recommend Palliative Care and Dietician f/u for support. If any concern re: Cognitive functioning w/in her ADLs, recommend f/u at her Facility and/or w/ Neurology for formal assessment.  SLP Visit Diagnosis: Dysphagia, oral phase (R13.11) (min generalized weakness; Dentures; Cognition)    Aspiration Risk   (reduced when following general aspiration precautions w/ setup support)    Diet Recommendation   Thin;Dysphagia 3 (mechanical soft) (gravies) = a more Mech Soft consistency diet w/ well-Cut meats, moistened foods; Thin liquids -- CUP drinking. Pt to Hold Cup when drinking. Recommend general aspiration precautions, small bites/sips slowly. Time b/t bites and sips to aid oral clearing. Reduce distractions during meals. Tray setup and sitting up support; Denture care/use for eating solids.  Medication Administration: Crushed with puree    Other  Recommendations Recommended Consults:  (Dietician; Palliative Care for GOC and support) Oral Care Recommendations: Oral care BID;Patient independent with oral care;Staff/trained caregiver to provide oral care (Denture care)     Assistance Recommended at Discharge  Intermittent   Functional Status Assessment Patient has had a recent decline in their functional status and demonstrates the ability to make significant improvements in function in a reasonable and predictable amount of time.  Frequency and Duration  (n/a)   (n/a)        Prognosis Prognosis for improved oropharyngeal function: Fair (-Good) Barriers to Reach Goals: Cognitive deficits;Time post onset;Severity of deficits Barriers/Prognosis Comment: min generalized weakness; Dentures; Cognition/awareness      Swallow Study   General Date of Onset: 04/03/24 HPI: Pt is a 78 y.o. female with Past medical history of HTN, HLD, DM, chronic systolic CHF, carotid Stenosis s/p right ICA stent, CAD, CKD 3a, smoker, COPD, HFrEF with last EF 40%, mitral regurgitation, unsure of pt's Baseline Cognitive functioning, who presented to ED for concern for code stroke.  OF NOTE: pt has Non-small cell lung cancer s/p SBRT per chart.  Last known normal around 11 AM, patient was noticed to have a low blood pressure at PCP office and neurological symptoms.  So code stroke was called and patient was sent to the [facility] ED. Pt was disoriented with right facial droop, right leg weakness, and dysarthria on exam.   As per patient, she has a chronic weakness in the right lower extremity, wheelchair-bound and wears Dentures.  Currently patient is back to her baseline, denies any complaints per ED chart notes.   MRI: No acute intracranial abnormality.  2. Multifocal hyperintense T2-weighted signal within the cerebral white matter,  most commonly due to chronic small vessel disease.  3. Generalized volume loss.  No chest imaging this admit; recent chest imaging last admit 01/2024: CT Chest: 1. New irregular consolidation and patchy ground-glass opacities  within the dependent right upper lobe, middle lobe, and right lower  lobe with air bronchograms, likely multifocal pneumonia. Possible  underlying posttreatment change in the setting of radiation  treatment.  2. Interval decreased size of the spiculated medial right upper lobe  nodule, consistent with treatment response.  3. New subcarinal lymphadenopathy, indeterminate.  4. Small right pleural effusion.. Type of Study: Bedside Swallow  Evaluation Previous Swallow Assessment: 01/2024 - mech soft/thins rec'd then Diet Prior to this Study: NPO Temperature Spikes Noted: No (wbc 4.7) Respiratory Status: Nasal cannula (2-3L) History of Recent Intubation: No Behavior/Cognition: Alert;Cooperative;Pleasant mood;Distractible;Requires cueing (unsure of pt's Baseline Cognitive functioning) Oral Cavity Assessment: Within Functional Limits;Dry (min) Oral Care Completed by SLP: Recent completion by staff Oral Cavity - Dentition: Dentures, top;Dentures, bottom (in place) Vision: Functional for self-feeding Self-Feeding Abilities: Able to feed self;Needs assist;Needs set up Patient Positioning: Upright in bed (full supported) Baseline Vocal Quality: Normal;Low vocal intensity (min; min mumbly w/ decreased articulation- suspect impact from Dentures) Volitional Cough: Strong Volitional Swallow: Able to elicit    Oral/Motor/Sensory Function Overall Oral Motor/Sensory Function: Within functional limits   Ice Chips Ice chips: Within functional limits Presentation: Spoon (fed; 3 trials)   Thin Liquid Thin Liquid: Within functional limits Presentation: Cup;Self Fed (10 trials)    Nectar Thick Nectar Thick Liquid: Not tested   Honey Thick Honey Thick Liquid: Not tested Presentation: Self fed   Puree Puree: Within functional limits Presentation: Spoon;Self Fed (10 trials)   Solid     Solid: Impaired (min) Presentation: Self Fed;Spoon (8 trials) Oral Phase Impairments: Impaired mastication (min extra time) Oral Phase Functional Implications: Impaired mastication (min extra time) Pharyngeal Phase Impairments:  (none) Other Comments: moistened foods        Comer Portugal, MS, CCC-SLP Speech Language Pathologist Rehab  Services; [facility] [phone] (ascom) Mishael Haran 04/04/2024,3:44 PM

## 2024-04-04 NOTE — Progress Notes (Signed)
 NEUROLOGY CONSULT FOLLOW UP NOTE   Date of service: April 04, 2024 Patient Name: Sarah White MRN:  [id number] DOB:  09-17-1945  Brief Review of HPI  78 yo F with a PMHx of HTN, HLD, DM, CHF, carotid stenosis s/p right ICA stent, CAD, CKD 3, smoker and COPD who presented to the hospital yesterday as a Code Stroke. Per EMS, pt lives in a facility and went to appointment in the AM, then came back at her normal baseline. Subsequently, around 11am, while preparing for lunch, she complained of not feeling well, was found to have generalized weakness, not able to sit up, with difficulty speaking. SBP was 80s, given midodrine and symptoms improved to only dysarthria. No more weakness but still slurry speech. Code stroke activated. On arrival to the ED, the patient was awake, alert, oriented to age but not to month, with slight right facial droop and questionable slurry speech (pt said that was her normal speech). No weakness and sensory was symmetrical. No hemianopia. No aphasia. CT showed no acute finding. CTA head and neck showed no LVO, with right ICA stent patent.      Vitals   Vitals:   04/03/24 1717 04/03/24 1959 04/04/24 0429 04/04/24 0752  BP: 134/62 (!) 125/58 (!) 142/65 (!) 146/64  Pulse: 63 74 84 82  Resp: 20 16 20 18   Temp:   97.6 F (36.4 C) 98.3 F (36.8 C)  TempSrc:   Oral   SpO2: 93% 100% 95% 99%  Weight:      Height:         Body mass index is 24.12 kg/m.  Physical Exam   Constitutional: Appears well-developed and well-nourished Psych: Affect appropriate to situation.  Eyes: No scleral injection.  HENT: No OP obstrucion.  Head: Normocephalic.  Respiratory: Effort normal, non-labored breathing.    Neurologic Examination   Mental Status: Awake and alert. Fully oriented. Thought content appropriate. Speech fluent without evidence of aphasia.  Able to follow all commands without difficulty. Cranial Nerves: II: Temporal visual fields intact with no extinction to DSS.  PERRL. III,IV, VI: No ptosis. EOMI. No nystagmus. V: Temp sensation equal bilaterally VII: Smile symmetric VIII: Hearing intact to voice IX,X: No hypophonia or hoarseness XI: Symmetric XII: Midline tongue extension Motor: RUE: 5/5 LUE: 5/5 RLE: 5/5 LLE: 5/5 Sensory:  FT intact x 4.  Cerebellar: No ataxia with FNF bilaterally Gait: Deferred   Medications  Current Facility-Administered Medications:     stroke: early stages of recovery book, , Does not apply, Once, Von Bellis, MD   0.9 %  sodium chloride  infusion, , Intravenous, Continuous, Von Bellis, MD, Stopped at 04/03/24 2149   acetaminophen  (TYLENOL ) tablet 650 mg, 650 mg, Oral, Q4H PRN **OR** acetaminophen  (TYLENOL ) 160 MG/5ML solution 650 mg, 650 mg, Per Tube, Q4H PRN **OR** acetaminophen  (TYLENOL ) suppository 650 mg, 650 mg, Rectal, Q4H PRN, Von Bellis, MD   albuterol  (PROVENTIL ) (2.5 MG/3ML) 0.083% nebulizer solution 2.5 mg, 2.5 mg, Inhalation, Q6H PRN, Von Bellis, MD   aspirin  EC tablet 81 mg, 81 mg, Oral, Daily, Von Bellis, MD   clopidogrel  (PLAVIX ) tablet 75 mg, 75 mg, Oral, Q0600, Von Bellis, MD   cyanocobalamin  (VITAMIN B12) injection 1,000 mcg, 1,000 mcg, Intramuscular, Q1200 **FOLLOWED BY** [START ON 04/11/2024] cyanocobalamin  (VITAMIN B12) tablet 1,000 mcg, 1,000 mcg, Oral, Daily, Von Bellis, MD   enoxaparin  (LOVENOX ) injection 40 mg, 40 mg, Subcutaneous, Q24H, Von Bellis, MD, 40 mg at 04/03/24 1837   fluticasone  furoate-vilanterol (BREO ELLIPTA ) 100-25 MCG/ACT 1  puff, 1 puff, Inhalation, Daily, Von Bellis, MD   insulin  aspart (novoLOG ) injection 0-9 Units, 0-9 Units, Subcutaneous, TID WC, Von Bellis, MD   ondansetron  (ZOFRAN ) injection 4 mg, 4 mg, Intravenous, Q6H PRN, Von Bellis, MD   pantoprazole  (PROTONIX ) EC tablet 40 mg, 40 mg, Oral, BID AC, Von Bellis, MD   rosuvastatin  (CRESTOR ) tablet 20 mg, 20 mg, Oral, Daily, Von Bellis, MD   senna-docusate (Senokot-S) tablet 1 tablet,  1 tablet, Oral, QHS PRN, Von Bellis, MD   traZODone  (DESYREL ) tablet 50 mg, 50 mg, Oral, QHS PRN, Von Bellis, MD   Vitamin D  (Ergocalciferol ) (DRISDOL ) 1.25 MG (50000 UNIT) capsule 50,000 Units, 50,000 Units, Oral, Q7 days, Von Bellis, MD  Labs and Diagnostic Imaging   CBC:  Recent Labs  Lab 04/03/24 1518 04/04/24 0325  WBC 5.3 4.7  NEUTROABS 4.0  --   HGB 11.9* 11.7*  HCT 38.2 36.6  MCV 86.4 85.5  PLT 134* 118*    Basic Metabolic Panel:  Lab Results  Component Value Date   NA 138 04/04/2024   K 3.7 04/04/2024   CO2 26 04/04/2024   GLUCOSE 74 04/04/2024   BUN 17 04/04/2024   CREATININE 0.90 04/04/2024   CALCIUM  9.0 04/04/2024   GFRNONAA >60 04/04/2024   GFRAA 83 09/24/2020   Lipid Panel:  Lab Results  Component Value Date   LDLCALC 76 01/21/2024   HgbA1c:  Lab Results  Component Value Date   HGBA1C 5.4 04/03/2024   Urine Drug Screen: No results found for: LABOPIA, COCAINSCRNUR, LABBENZ, AMPHETMU, THCU, LABBARB  Alcohol  Level     Component Value Date/Time   [facility] <15 04/03/2024 1518   INR  Lab Results  Component Value Date   INR 1.2 04/03/2024   APTT  Lab Results  Component Value Date   APTT 34 04/03/2024   Recent prior TTE (02/10/24): 1. Left ventricular ejection fraction, by estimation, is 35 to 40%. The  left ventricle has moderately decreased function. Left ventricular  endocardial border not optimally defined to evaluate regional wall motion.  The left ventricular internal cavity  size was mildly dilated. Left ventricular diastolic function could not be  evaluated.   2. Right ventricular systolic function is normal. The right ventricular  size is normal.   3. The mitral valve is normal in structure. No evidence of mitral valve  regurgitation. No evidence of mitral stenosis.   4. The aortic valve is normal in structure. Aortic valve regurgitation is  not visualized. Aortic valve sclerosis/calcification is present, without   any evidence of aortic stenosis.   5. The inferior vena cava is normal in size with greater than 50%  respiratory variability, suggesting right atrial pressure of 3 mmHg.    Assessment  78 year old female with a PMHx of HTN, HLD, DM, CHF, carotid stenosis s/p right ICA stent, CAD, CKD 3, smoker and COPD who presented to the ED for not feeling well, generalized weakness and difficulty with speech. SBP was 80s, and symptoms improved with BP augmentation.  Last seen well 11 AM.  NIH score 3 with slight right facial droop, mild dysarthria and not oriented to month.  CT showed no acute finding. CTA head and neck no LVO, right ICA stent patent.  Patient was not a TNK candidate given right at 4.5-hour target and with nondisabling symptoms.  - Exam today is nonfocal.  - MRI brain: No acute intracranial abnormality. Multifocal hyperintense T2-weighted signal within the cerebral white matter, most commonly due to chronic  small vessel disease. Generalized volume loss. - See results for recent TTE above - Labs: - HgbA1C is normal.  - TSH normal - EtOH negative - Vitamin D  level is low at 11.97 - Impression: Given negative MRI, hypotension on presentation and improvement of her symptoms with BP augmentation, the patient's presenting symptoms are felt most likely to have been secondary to presyncope.    Recommendations  PT/OT/speech consult Hydrate well Has had a recent TTE, no need to repeat Permissive hypertension (only treat if BP > 220/120 unless a lower blood pressure is clinically necessary) for 24 hours post symptoms onset (continue until 11 AM today, then gradually decrease BP by 20% per day to a goal of 120/80) GI and DVT prophylaxis  Okay to continue home aspirin  and Plavix  today if she has passed her swallow evaluation Vitamin D  supplementation ______________________________________________________________________   Bonney SHARK, Simi Briel, MD [facility]

## 2024-04-04 NOTE — TOC Transition Note (Signed)
 Transition of Care [facility]) - Discharge Note   Patient Details  Name: Sarah White MRN: [id number] Date of Birth: 1946-03-21  Transition of [facility]) CM/SW Contact:  Dalia GORMAN Fuse, RN Phone Number: 04/04/2024, 3:42 PM   Clinical Narrative:     Patient is medically clear to discharge to [facility]. [facility] will transport.   Final next level of care: Skilled Nursing Facility Barriers to Discharge: Barriers Resolved   Patient Goals and CMS Choice            Discharge Placement              Patient chooses bed at: [facility] Patient to be transferred to facility by: [facility]      Discharge Plan and Services Additional resources added to the After Visit Summary for                                       Social Drivers of Health (SDOH) Interventions SDOH Screenings   Food Insecurity: No Food Insecurity (04/03/2024)  Housing: Low Risk  (04/03/2024)  Transportation Needs: No Transportation Needs (04/03/2024)  Utilities: Not At Risk (04/03/2024)  Alcohol  Screen: Low Risk  (06/18/2023)  Depression (PHQ2-9): Low Risk  (01/21/2024)  Financial Resource Strain: Low Risk  (09/09/2023)  Physical Activity: Inactive (09/09/2023)  Social Connections: Unknown (04/03/2024)  Stress: No Stress Concern Present (09/09/2023)  Tobacco Use: High Risk (04/03/2024)  Health Literacy: Inadequate Health Literacy (06/22/2023)     Readmission Risk Interventions    02/08/2024   10:01 AM  Readmission Risk Prevention Plan  Transportation Screening Complete  PCP or Specialist Appt within 3-5 Days --  HRI or Home Care Consult --  Social Work Consult for Recovery Care Planning/Counseling --  Palliative Care Screening Not Applicable  Medication Review Oceanographer) Complete

## 2024-04-04 NOTE — Discharge Summary (Signed)
 [facility] Discharge Summary   Patient: Sarah White FMW:[id number]  PCP: Vicci Duwaine SQUIBB, DO  Date of admission: 04/03/2024   Date of discharge:  04/04/2024     Discharge Diagnoses:  Principal Problem:   Stroke-like symptoms   Admitted From: SNF Disposition:  SNF   Recommendations for Outpatient Follow-up:  Follow with PCP, patient should be seen by an MD in 1 to 2 days.  Continue to monitor BP and titrate medication accordingly.  Decreased dose of Coreg , because patient was hypotensive and presented with presyncopal episode. Continued dual antiplatelet therapy, aspirin  and Plavix  as per neurology. Follow-up with neurology in 1 to 2 weeks as an outpatient for duration of DAPT Follow up LABS/TEST: Repeat B12 and vitamin D  level after 3 to 6 months   Follow-up Information     Johnson, [name], DO Follow up.   Specialty: Family Medicine Why: hospital follow up Contact information: [address] [place] [address] [phone]                Diet recommendation: Cardiac and Carb modified diet  Activity: The patient is advised to gradually reintroduce usual activities, as tolerated  Discharge Condition: stable  Code Status: DNR   History of present illness: As per the H and P dictated on admission  Hospital Course:  Sarah White is a 78 y.o. female with Past medical history of HTN, HLD, DM, chronic systolic CHF, carotid stenosis s/p right ICA stent, CAD, CKD 3a, smoker, COPD presented to ED for code stroke.  Last known normal around 11 AM, patient was noticed to have a low blood pressure at PCP office and neurological symptoms.  So code stroke was called and patient was sent to the [facility] ED. Patient was seen by teleneurologist, Patient was disoriented with right facial droop, right leg weakness, and dysarthria  on exam.  As per patient she has a chronic weakness in the right lower extremity, wheelchair-bound.  Currently patient is back to her baseline, denies any  complaints.   ED Course: VS afebrile, HR 81, RR 20, BP 143/52, 95% on 3 L O2 BMP blood glucose 158, creatinine 1.01 slightly elevated rest within normal range Albumin 3.1 low, transaminitis, elevated AST and ALT. CT head: No hemorrhage or acute ischemic finding CTA head and neck: No large vessel occlusion, patent right ICA stent, no significant stenosis.   Assessment/Plan   # Presyncope secondary to hypotension.  Patient presented with strokelike symptoms.  Code stroke was called.  Workup so far negative. S/p monitor on telemetry and neurochecks remained stable.  Seen by neurology, continued DAPT, aspirin  and Plavix .  Continue statin.  SLP eval done, recommended mechanical soft diet.  PT and OT recommended SNF placement.  Patient is wheelchair-bound at baseline. MRI brain negative for any acute stroke.     # Chronic systolic CHF, HTN, HLD, CAD, s/p right carotid stent: Continued aspirin , Plavix  and statin Held antihypertensive medications during hospital stay.  Patient was on Coreg  12.5 mg p.o. twice daily.  Dose decreased to 3.125 mg p.o. twice daily.  Continue to monitor BP and titrate dose accordingly.   # IDDM T2: Continue carb modified diet, continue NovoLog  sliding scale and monitor FSBG. # COPD, no exacerbation noticed Continue inhalers  # Vitamin B12 level 284, goal >400. Started vitamin B12 1000 mcg IM injection daily x 7 days followed by oral supplement  Follow-up PCP to repeat vitamin B12 level after 3 to 6 months.  # Vitamin D  deficiency:  started vitamin D  50,000 units p.o. weekly, follow with PCP to repeat vitamin D  level after 3 to 6 months.   Body mass index is 24.12 kg/m.  Nutrition Interventions:  Patient was seen by physical therapy, who recommended Therapy, SNF placement, which was arranged. On the day of the discharge the patient's vitals were stable, and no other acute medical condition were reported by patient. the patient was felt safe to be discharge at  [facility].  Consultants: Neurology Procedures: None  Discharge Exam: General: Appear in no distress, no Rash; Oral Mucosa Clear, moist. Cardiovascular: S1 and S2 Present, no Murmur, Respiratory: normal respiratory effort, Bilateral Air entry present and no Crackles, no wheezes Abdomen: Bowel Sound present, Soft and no tenderness, no hernia Extremities: no Pedal edema, no calf tenderness Neurology: Chronic RLE weakness power 4/5.  No new neurological changes. affect appropriate.  Filed Weights   04/03/24 1601  Weight: 63.7 kg   Vitals:   04/04/24 0752 04/04/24 1138  BP: (!) 146/64 (!) 136/57  Pulse: 82 87  Resp: 18 18  Temp: 98.3 F (36.8 C) 98.2 F (36.8 C)  SpO2: 99% 100%    DISCHARGE MEDICATION: Allergies as of 04/04/2024       Reactions   Amlodipine  Swelling   Atorvastatin  Other (See Comments)   Myalgia    Codeine Nausea And Vomiting   Hydrochlorothiazide  Other (See Comments)   Causes Low Potassium   Lisinopril  Other (See Comments)   Causes dizziness   Metformin  And Related Nausea Only, Other (See Comments)   Causes dizziness   Amlodipine  Swelling   Atorvastatin  Other (See Comments)   myalgia   Codeine Nausea And Vomiting   Hctz [hydrochlorothiazide ] Other (See Comments)   Hypokalemia   Lisinopril  Other (See Comments)   Dizziness   Metformin  And Related Nausea Only   Nausea, dizziness        Medication List     STOP taking these medications    cyclobenzaprine  10 MG tablet Commonly known as: FLEXERIL    dexamethasone  2 MG tablet Commonly known as: DECADRON    diphenhydrAMINE  25 MG tablet Commonly known as: BENADRYL    lidocaine  5 % Commonly known as: Lidoderm    promethazine  25 MG suppository Commonly known as: PHENERGAN    sacubitril -valsartan  49-51 MG Commonly known as: ENTRESTO    traZODone  50 MG tablet Commonly known as: DESYREL    triamcinolone  ointment 0.5 % Commonly known as: KENALOG        TAKE these medications    albuterol  108 (90  Base) MCG/ACT inhaler Commonly known as: VENTOLIN  HFA Inhale 2 puffs into the lungs every 6 (six) hours as needed for wheezing or shortness of breath.   aspirin  EC 81 MG tablet Take 81 mg by mouth at bedtime.   Bevespi Aerosphere 9-4.8 MCG/ACT Aero Generic drug: Glycopyrrolate-Formoterol  Inhale 2 puffs into the lungs 2 (two) times daily.   bisacodyl  10 MG suppository Commonly known as: DULCOLAX Place 1 suppository (10 mg total) rectally daily as needed (no BM in over 2 days).   buPROPion  300 MG 24 hr tablet Commonly known as: WELLBUTRIN  XL Take 1 tablet (300 mg total) by mouth daily.   carvedilol  3.125 MG tablet Commonly known as: COREG  Take 1 tablet (3.125 mg total) by mouth 2 (two) times daily with a meal. Decreased dose from 12.5 mg due to hypotension.  Monitor BP and titrate dose according to BP. What changed:  medication strength how much to take additional instructions   clopidogrel  75 MG tablet Commonly known as: PLAVIX  Take 1  tablet (75 mg total) by mouth daily at 6 (six) AM.   cyanocobalamin  1000 MCG/ML injection Commonly known as: VITAMIN B12 Inject 1 mL (1,000 mcg total) into the muscle daily at 12 noon for 7 days. Start taking on: April 05, 2024   cyanocobalamin  1000 MCG tablet Take 1 tablet (1,000 mcg total) by mouth daily. Start taking on: April 11, 2024   furosemide  40 MG tablet Commonly known as: LASIX  Take 0.5 tablets (20 mg total) by mouth daily. What changed:  how much to take Another medication with the same name was removed. Continue taking this medication, and follow the directions you see here.   guaiFENesin  600 MG 12 hr tablet Commonly known as: MUCINEX  Take 1 tablet (600 mg total) by mouth 2 (two) times daily as needed for cough or to loosen phlegm.   Incruse Ellipta  62.5 MCG/ACT Aepb Generic drug: umeclidinium bromide  Inhale 1 puff into the lungs daily.   insulin  aspart 100 UNIT/ML injection Commonly known as: novoLOG  Inject 0-9  Units into the skin 3 (three) times daily with meals.   melatonin 5 MG Tabs Take 1 tablet (5 mg total) by mouth at bedtime.   mometasone -formoterol  100-5 MCG/ACT Aero Commonly known as: DULERA  Inhale 2 puffs into the lungs 2 (two) times daily.   nitroGLYCERIN  0.4 MG SL tablet Commonly known as: NITROSTAT  DISSOLVE ONE TABLET UNDER THE TONGUE EVERY 5 MINUTES AS NEEDED FOR CHEST PAIN.  DO NOT EXCEED A TOTAL OF 3 DOSES IN 15 MINUTES   ondansetron  8 MG tablet Commonly known as: Zofran  Take 1 tablet (8 mg total) by mouth every 8 (eight) hours as needed for nausea or vomiting.   pantoprazole  40 MG tablet Commonly known as: Protonix  Take 1 tablet (40 mg total) by mouth 2 (two) times daily before a meal.   polyethylene glycol 17 g packet Commonly known as: MIRALAX  / GLYCOLAX  Take 17 g by mouth daily. Hold for loose or frequent stools.   QUEtiapine  50 MG Tb24 24 hr tablet Commonly known as: SEROQUEL  XR Take 2 tablets (100 mg total) by mouth at bedtime.   rosuvastatin  20 MG tablet Commonly known as: CRESTOR  Take 1 tablet (20 mg total) by mouth daily.   senna-docusate 8.6-50 MG tablet Commonly known as: Senokot-S Take 1 tablet by mouth 2 (two) times daily. Hold for loose or frequent stools.   sertraline  100 MG tablet Commonly known as: ZOLOFT  Take 2 tablets (200 mg total) by mouth daily.   Vitamin D  (Ergocalciferol ) 1.25 MG (50000 UNIT) Caps capsule Commonly known as: DRISDOL  Take 1 capsule (50,000 Units total) by mouth every 7 (seven) days. Start taking on: April 11, 2024       Allergies  Allergen Reactions   Amlodipine  Swelling   Atorvastatin  Other (See Comments)    Myalgia    Codeine Nausea And Vomiting   Hydrochlorothiazide  Other (See Comments)    Causes Low Potassium   Lisinopril  Other (See Comments)    Causes dizziness   Metformin  And Related Nausea Only and Other (See Comments)    Causes dizziness   Amlodipine  Swelling   Atorvastatin  Other (See Comments)     myalgia   Codeine Nausea And Vomiting   Hctz [Hydrochlorothiazide ] Other (See Comments)    Hypokalemia   Lisinopril  Other (See Comments)    Dizziness    Metformin  And Related Nausea Only    Nausea, dizziness   Discharge Instructions     Call MD for:  difficulty breathing, headache or visual disturbances  Complete by: As directed    Call MD for:  extreme fatigue   Complete by: As directed    Call MD for:  persistant dizziness or light-headedness   Complete by: As directed    Call MD for:  persistant nausea and vomiting   Complete by: As directed    Call MD for:  severe uncontrolled pain   Complete by: As directed    Call MD for:  temperature >100.4   Complete by: As directed    Diet - low sodium heart healthy   Complete by: As directed    Diet Carb Modified   Complete by: As directed    Discharge instructions   Complete by: As directed    Follow with PCP, patient should be seen by an MD in 1 to 2 days.  Continue to monitor BP and titrate medication accordingly.  Decreased dose of Coreg , because patient was hypotensive and presented with presyncopal episode. Continued dual antiplatelet therapy, aspirin  and Plavix  as per neurology. Follow-up with neurology in 1 to 2 weeks as an outpatient for duration of DAPT.   Increase activity slowly   Complete by: As directed        The results of significant diagnostics from this hospitalization (including imaging, microbiology, ancillary and laboratory) are listed below for reference.    Significant Diagnostic Studies: MR BRAIN WO CONTRAST Result Date: 04/03/2024 EXAM: MRI BRAIN WITHOUT CONTRAST 04/03/2024 10:43:17 PM TECHNIQUE: Multiplanar multisequence MRI of the head/brain was performed without the administration of intravenous contrast. COMPARISON: 06/05/2020 CLINICAL HISTORY: Slurred speech, transient right sided weakness, stroke workup. LNW was 1100 today with c/o of asphasia by staff at facility. FINDINGS: BRAIN AND VENTRICLES: No  acute infarct. No intracranial hemorrhage. No mass. No midline shift. No hydrocephalus. The sella is unremarkable. Normal flow voids. Multifocal hyperintense T2-weighted signal within the cerebral white matter, most commonly due to chronic small vessel disease. Generalized volume loss. ORBITS: No acute abnormality. SINUSES AND MASTOIDS: No acute abnormality. BONES AND SOFT TISSUES: Normal marrow signal. No acute soft tissue abnormality. IMPRESSION: 1. No acute intracranial abnormality. 2. Multifocal hyperintense T2-weighted signal within the cerebral white matter, most commonly due to chronic small vessel disease. 3. Generalized volume loss. Electronically signed by: Franky Stanford MD 04/03/2024 11:07 PM EDT RP Workstation: [id number]   CT ANGIO HEAD NECK W WO CM (CODE STROKE) Result Date: 04/03/2024 EXAM: CTA HEAD AND NECK WITH AND WITHOUT 04/03/2024 03:39:21 PM TECHNIQUE: CTA of the head and neck was performed with and without the administration of intravenous contrast. Multiplanar 2D and/or 3D reformatted images are provided for review. Automated exposure control, iterative reconstruction, and/or weight based adjustment of the mA/kV was utilized to reduce the radiation dose to as low as reasonably achievable. Stenosis of the internal carotid arteries measured using NASCET criteria. COMPARISON: CT head earlier same day and on 03/24/2024. CLINICAL HISTORY: Stroke/TIA, determine embolic source. CODE STROKE; Dr. Jerri [id number] FINDINGS: CTA NECK: AORTIC ARCH AND ARCH VESSELS: Mild atherosclerosis of the aortic arch. Mixed atherosclerotic plaque at the origin of the left subclavian artery without significant stenosis. CERVICAL CAROTID ARTERIES: Mild calcified atherosclerosis of the right common carotid artery. Stent within the distal common carotid artery extending into the proximal right cervical ICA. The stent is patent. There is mild atherosclerosis along the left common carotid artery and at the left carotid  bifurcation without hemodynamically significant stenosis. Irregularity and beaded appearance of the bilateral cervical ICAs suggestive of fibromuscular dysplasia. CERVICAL VERTEBRAL ARTERIES: Scattered atherosclerosis of the extracranial  vertebral arteries without high-grade stenosis. LUNGS AND MEDIASTINUM: Central lobular emphysema in the visualized lung apices. SOFT TISSUES: No acute abnormality. BONES: Degenerative changes in the visualized spine with disc space narrowing most pronounced at C6-7. 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 Edentulous maxilla and mandible. Similar chronic deformity of the T3 superior endplate. CTA HEAD: ANTERIOR CIRCULATION: The intracranial internal carotid arteries are patent bilaterally. There is mild atherosclerosis of the carotid siphons. The MCAs are patent bilaterally. The ACAs are patent bilaterally. 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 POSTERIOR CIRCULATION: No significant stenosis of the posterior cerebral arteries. No significant stenosis of the basilar artery. No significant stenosis of the vertebral arteries. No aneurysm. OTHER: No dural venous sinus thrombosis on this non-dedicated study. IMPRESSION: 1. No large vessel occlusion or hemodynamically significant stenosis in the head or neck. 2. Mild atherosclerosis of the aortic arch, right common carotid artery, and left common carotid artery/bifurcation without hemodynamically significant stenosis. 3. Patent stent within the distal right common carotid artery extending into the proximal right cervical ICA. 4. Irregularity and beaded appearance of the bilateral cervical ICAs suggestive of fibromuscular dysplasia. 5. Scattered atherosclerosis of the extracranial vertebral arteries without high-grade stenosis. Electronically signed by: Donnice Mania MD 04/03/2024 03:58 PM EDT RP Workstation: [id number]   CT HEAD CODE STROKE WO CONTRAST Result Date: 04/03/2024 EXAM: CT HEAD WITHOUT  CONTRAST 04/03/2024 03:16:41 PM TECHNIQUE: CT of the head was performed without the administration of intravenous contrast. Automated exposure control, iterative reconstruction, and/or weight based adjustment of the mA/kV was utilized to reduce the radiation dose to as low as reasonably achievable. COMPARISON: CT of the head dated 03/21/2024. CLINICAL HISTORY: Neuro deficit, acute, stroke suspected. CODE STROKE; Dr. Deborrah [id number] FINDINGS: BRAIN AND VENTRICLES: No acute hemorrhage. Gray-white differentiation is preserved. No hydrocephalus. No extra-axial collection. No mass effect or midline shift. Age-related cerebral volume loss present. Mild periventricular white matter disease present. Aspect score is 10. ORBITS: No acute abnormality. SINUSES: No acute abnormality. SOFT TISSUES AND SKULL: No acute soft tissue abnormality. No skull fracture. Moderate calcifications within the carotid siphons. IMPRESSION: 1. No acute intracranial abnormality. Aspects score is 10. 2. Age-related cerebral volume loss and mild periventricular white matter disease. 3. The above findings were communicated with Dr. Jerri at 3:25 pm 04/03/2024. Electronically signed by: evalene coho 04/03/2024 03:28 PM EDT RP Workstation: [id number]   CT Head Wo Contrast Result Date: 03/21/2024 CLINICAL DATA:  Head trauma, minor (Age >= 65y); Neck trauma (Age >= 65y); Facial trauma, blunt EXAM: CT HEAD WITHOUT CONTRAST CT MAXILLOFACIAL WITHOUT CONTRAST CT CERVICAL SPINE WITHOUT CONTRAST TECHNIQUE: Multidetector CT imaging of the head, cervical spine, and maxillofacial structures were performed using the standard protocol without intravenous contrast. Multiplanar CT image reconstructions of the cervical spine and maxillofacial structures were also generated. RADIATION DOSE REDUCTION: This exam was performed according to the departmental dose-optimization program which includes automated exposure control, adjustment of the mA and/or kV according to  patient size and/or use of iterative reconstruction technique. COMPARISON:  CT head max face C-spine 03/20/2024 FINDINGS: CT HEAD FINDINGS Brain: No evidence of large-territorial acute infarction. No parenchymal hemorrhage. No mass lesion. No extra-axial collection. No mass effect or midline shift. No hydrocephalus. Basilar cisterns are patent. Vascular: No hyperdense vessel. Atherosclerotic calcifications are present within the cavernous internal carotid and vertebral arteries. Skull: No acute fracture or focal lesion. Other: None. CT MAXILLOFACIAL FINDINGS Osseous: Age-indeterminate minimally displaced fracture of the nasal process of the right maxilla (4:70).  No destructive process. Patient is edentulous. Sinuses/Orbits: Paranasal sinuses and mastoid air cells are clear. The orbits are unremarkable. Soft tissues: Negative. CT CERVICAL SPINE FINDINGS Alignment: Normal. Skull base and vertebrae: Multilevel moderate degenerative changes of the spine. No severe osseous neural foraminal or central canal stenosis. No acute fracture. No aggressive appearing focal osseous lesion or focal pathologic process. Soft tissues and spinal canal: No prevertebral fluid or swelling. No visible canal hematoma. Upper chest: Biapical pleural/pulmonary scarring. Severe centrilobular emphysematous changes. Other: Right carotid artery stent within the neck. IMPRESSION: 1. No acute intracranial abnormality. 2. Age-indeterminate minimally displaced fracture of the nasal process of the right maxilla correlate with point tenderness to palpation to evaluate for an acute component. 3. No definite acute displaced fracture or traumatic listhesis of the cervical spine. 4.  Emphysema (ICD10-J43.9). Electronically Signed   By: Morgane  Naveau M.D.   On: 03/21/2024 21:37   CT Cervical Spine Wo Contrast Result Date: 03/21/2024 CLINICAL DATA:  Head trauma, minor (Age >= 65y); Neck trauma (Age >= 65y); Facial trauma, blunt EXAM: CT HEAD WITHOUT  CONTRAST CT MAXILLOFACIAL WITHOUT CONTRAST CT CERVICAL SPINE WITHOUT CONTRAST TECHNIQUE: Multidetector CT imaging of the head, cervical spine, and maxillofacial structures were performed using the standard protocol without intravenous contrast. Multiplanar CT image reconstructions of the cervical spine and maxillofacial structures were also generated. RADIATION DOSE REDUCTION: This exam was performed according to the departmental dose-optimization program which includes automated exposure control, adjustment of the mA and/or kV according to patient size and/or use of iterative reconstruction technique. COMPARISON:  CT head max face C-spine 03/20/2024 FINDINGS: CT HEAD FINDINGS Brain: No evidence of large-territorial acute infarction. No parenchymal hemorrhage. No mass lesion. No extra-axial collection. No mass effect or midline shift. No hydrocephalus. Basilar cisterns are patent. Vascular: No hyperdense vessel. Atherosclerotic calcifications are present within the cavernous internal carotid and vertebral arteries. Skull: No acute fracture or focal lesion. Other: None. CT MAXILLOFACIAL FINDINGS Osseous: Age-indeterminate minimally displaced fracture of the nasal process of the right maxilla (4:70). No destructive process. Patient is edentulous. Sinuses/Orbits: Paranasal sinuses and mastoid air cells are clear. The orbits are unremarkable. Soft tissues: Negative. CT CERVICAL SPINE FINDINGS Alignment: Normal. Skull base and vertebrae: Multilevel moderate degenerative changes of the spine. No severe osseous neural foraminal or central canal stenosis. No acute fracture. No aggressive appearing focal osseous lesion or focal pathologic process. Soft tissues and spinal canal: No prevertebral fluid or swelling. No visible canal hematoma. Upper chest: Biapical pleural/pulmonary scarring. Severe centrilobular emphysematous changes. Other: Right carotid artery stent within the neck. IMPRESSION: 1. No acute intracranial  abnormality. 2. Age-indeterminate minimally displaced fracture of the nasal process of the right maxilla correlate with point tenderness to palpation to evaluate for an acute component. 3. No definite acute displaced fracture or traumatic listhesis of the cervical spine. 4.  Emphysema (ICD10-J43.9). Electronically Signed   By: Morgane  Naveau M.D.   On: 03/21/2024 21:37   CT Maxillofacial Wo Contrast Result Date: 03/21/2024 CLINICAL DATA:  Head trauma, minor (Age >= 65y); Neck trauma (Age >= 65y); Facial trauma, blunt EXAM: CT HEAD WITHOUT CONTRAST CT MAXILLOFACIAL WITHOUT CONTRAST CT CERVICAL SPINE WITHOUT CONTRAST TECHNIQUE: Multidetector CT imaging of the head, cervical spine, and maxillofacial structures were performed using the standard protocol without intravenous contrast. Multiplanar CT image reconstructions of the cervical spine and maxillofacial structures were also generated. RADIATION DOSE REDUCTION: This exam was performed according to the departmental dose-optimization program which includes automated exposure control, adjustment of the mA and/or  kV according to patient size and/or use of iterative reconstruction technique. COMPARISON:  CT head max face C-spine 03/20/2024 FINDINGS: CT HEAD FINDINGS Brain: No evidence of large-territorial acute infarction. No parenchymal hemorrhage. No mass lesion. No extra-axial collection. No mass effect or midline shift. No hydrocephalus. Basilar cisterns are patent. Vascular: No hyperdense vessel. Atherosclerotic calcifications are present within the cavernous internal carotid and vertebral arteries. Skull: No acute fracture or focal lesion. Other: None. CT MAXILLOFACIAL FINDINGS Osseous: Age-indeterminate minimally displaced fracture of the nasal process of the right maxilla (4:70). No destructive process. Patient is edentulous. Sinuses/Orbits: Paranasal sinuses and mastoid air cells are clear. The orbits are unremarkable. Soft tissues: Negative. CT CERVICAL  SPINE FINDINGS Alignment: Normal. Skull base and vertebrae: Multilevel moderate degenerative changes of the spine. No severe osseous neural foraminal or central canal stenosis. No acute fracture. No aggressive appearing focal osseous lesion or focal pathologic process. Soft tissues and spinal canal: No prevertebral fluid or swelling. No visible canal hematoma. Upper chest: Biapical pleural/pulmonary scarring. Severe centrilobular emphysematous changes. Other: Right carotid artery stent within the neck. IMPRESSION: 1. No acute intracranial abnormality. 2. Age-indeterminate minimally displaced fracture of the nasal process of the right maxilla correlate with point tenderness to palpation to evaluate for an acute component. 3. No definite acute displaced fracture or traumatic listhesis of the cervical spine. 4.  Emphysema (ICD10-J43.9). Electronically Signed   By: Morgane  Naveau M.D.   On: 03/21/2024 21:37   DG Knee Complete 4 Views Right Result Date: 03/21/2024 CLINICAL DATA:  fall EXAM: RIGHT KNEE - COMPLETE 4+ VIEW COMPARISON:  None Available. FINDINGS: No evidence of fracture, dislocation, or joint effusion. No evidence of arthropathy or other focal bone abnormality. Soft tissues are unremarkable. Vascular calcifications. IMPRESSION: No acute displaced fracture or dislocation. Electronically Signed   By: Morgane  Naveau M.D.   On: 03/21/2024 20:55   CT Maxillofacial WO CM Result Date: 03/20/2024 CLINICAL DATA:  Facial trauma, blunt EXAM: CT MAXILLOFACIAL WITHOUT CONTRAST TECHNIQUE: Multidetector CT imaging of the maxillofacial structures was performed. Multiplanar CT image reconstructions were also generated. RADIATION DOSE REDUCTION: This exam was performed according to the departmental dose-optimization program which includes automated exposure control, adjustment of the mA and/or kV according to patient size and/or use of iterative reconstruction technique. COMPARISON:  None Available. FINDINGS: Osseous:  The patient ones are intact. No osseous lesions are present. Orbits: Unremarkable.  No evidence of acute traumatic injury. Sinuses: Clear paranasal sinuses and mastoid air cells. Soft tissues: No significant soft tissue swelling or soft tissue injury evident. Limited intracranial: Negative. IMPRESSION: Negative. Electronically Signed   By: Evalene Coho M.D.   On: 03/20/2024 14:50   CT Head Wo Contrast Result Date: 03/20/2024 CLINICAL DATA:  Head trauma, minor (Age >= 65y) EXAM: CT HEAD WITHOUT CONTRAST TECHNIQUE: Contiguous axial images were obtained from the base of the skull through the vertex without intravenous contrast. RADIATION DOSE REDUCTION: This exam was performed according to the departmental dose-optimization program which includes automated exposure control, adjustment of the mA and/or kV according to patient size and/or use of iterative reconstruction technique. COMPARISON:  MRI of the head dated June 05, 2020. FINDINGS: Brain: Age-related atrophy. Mild periventricular white matter disease. No evidence of hemorrhage, mass acute cortical infarct or hydrocephalus. Vascular: Negative. Skull: Intact and unremarkable. Sinuses/Orbits: Clear paranasal sinuses and mastoid air cells. Normal orbits. Other: None. IMPRESSION: 1. Mild periventricular white matter disease. No evidence of acute traumatic injury. Electronically Signed   By: Evalene Coho HERO.D.  On: 03/20/2024 14:39   CT Cervical Spine Wo Contrast Result Date: 03/20/2024 CLINICAL DATA:  Neck trauma.  Patient fell, striking face. EXAM: CT CERVICAL SPINE WITHOUT CONTRAST TECHNIQUE: Multidetector CT imaging of the cervical spine was performed without intravenous contrast. Multiplanar CT image reconstructions were also generated. RADIATION DOSE REDUCTION: This exam was performed according to the departmental dose-optimization program which includes automated exposure control, adjustment of the mA and/or kV according to patient size and/or  use of iterative reconstruction technique. COMPARISON:  Neck CTA 02/07/2020. FINDINGS: Alignment: Mildly exaggerated cervical lordosis. No focal angulation or listhesis. Skull base and vertebrae: No evidence of acute cervical spine fracture or traumatic subluxation. 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 Chronic degenerative changes anteriorly at C1-2. Soft tissues and spinal canal: No prevertebral fluid or swelling. No visible canal hematoma. Disc levels: Similar chronic multilevel spondylosis with disc space narrowing and uncinate spurring most advanced at C6-7. Asymmetric facet hypertrophy on the left contributes to left foraminal narrowing at C4-5, C5-6 and C6-7. No large disc herniation identified. As above, congenital incomplete segmentation at C3-4. Upper chest: Emphysematous changes and scarring at both lung apices. Other: Right carotid stent.  Bilateral carotid atherosclerosis. IMPRESSION: 1. No evidence of acute cervical spine fracture, traumatic subluxation or static signs of instability. 2. Chronic multilevel cervical spondylosis as described. Electronically Signed   By: Elsie Perone M.D.   On: 03/20/2024 14:37    Microbiology: Recent Results (from the past 240 hours)  MRSA Next Gen by PCR, Nasal     Status: None   Collection Time: 04/03/24  7:41 PM   Specimen: Nasal Mucosa; Nasal Swab  Result Value Ref Range Status   MRSA by PCR Next Gen NOT DETECTED NOT DETECTED Final    Comment: (NOTE) The GeneXpert MRSA Assay (FDA approved for NASAL specimens only), is one component of a comprehensive MRSA colonization surveillance program. It is not intended to diagnose MRSA infection nor to guide or monitor treatment for MRSA infections. Test performance is not FDA approved in patients less than 1 years old. Performed at [facility], [address] [place], [place] [address]      Labs: CBC: Recent Labs  Lab 04/03/24 1518 04/04/24 0325  WBC 5.3 4.7  NEUTROABS 4.0  --    HGB 11.9* 11.7*  HCT 38.2 36.6  MCV 86.4 85.5  PLT 134* 118*   Basic Metabolic Panel: Recent Labs  Lab 04/03/24 1518 04/04/24 0325  NA 138 138  K 4.0 3.7  CL 98 101  CO2 25 26  GLUCOSE 158* 74  BUN 17 17  CREATININE 1.01* 0.90  CALCIUM  9.1 9.0  MG 2.2 2.0  PHOS 3.6 3.7   Liver Function Tests: Recent Labs  Lab 04/03/24 1518 04/04/24 0325  AST 148* 128*  ALT 69* 65*  ALKPHOS 77 71  BILITOT 0.7 0.8  PROT 5.9* 5.8*  ALBUMIN 3.1* 3.0*   No results for input(s): LIPASE, AMYLASE in the last 168 hours. No results for input(s): AMMONIA in the last 168 hours. Cardiac Enzymes: Recent Labs  Lab 04/03/24 1518  CKTOTAL 14*   BNP (last 3 results) No results for input(s): BNP in the last 8760 hours. CBG: Recent Labs  Lab 04/03/24 1506 04/03/24 1721 04/03/24 2002 04/04/24 0753 04/04/24 1132  GLUCAP 155* 91 83 95 146*    Time spent: 35 minutes  Signed:  Elvan Sor  [facility] 04/04/2024 2:09 PM

## 2024-04-04 NOTE — Evaluation (Signed)
 Physical Therapy Evaluation Patient Details Name: Sarah White MRN: [id number] DOB: 1946/01/23 Today's Date: 04/04/2024  History of Present Illness  Patient is a 78 year old female coming from SNF with stroke like symptoms, right side weakness and dysarthria. PMH: chronic right leg weakness, wheelchair bound, HTN, HLD, DM, chronic systolic CHF, carotid stenosis s/p right ICA stent, CAD, CKD 3a, smoker, COPD.   Clinical Impression  Patient is agreeable to PT evaluation. She reports working with therapy at [facility]. She has been unable to walk without significant assistance. Prior to rehab she was living with her son.  Today the patient needs assistance with mobility. She has a posterior lean, requiring assistance to maintain midline at times in sitting. She was able to take a few steps with pre-gait activity performed. Activity tolerance limited by fatigue. Recommend to continue PT to maximize independence with return to rehabilitation < 3 hours/day after this hospital stay.       If plan is discharge home, recommend the following: A lot of help with walking and/or transfers;A lot of help with bathing/dressing/bathroom;Assist for transportation;Help with stairs or ramp for entrance;Assistance with cooking/housework   Can travel by private vehicle   No    Equipment Recommendations None recommended by PT  Recommendations for Other Services       Functional Status Assessment Patient has had a recent decline in their functional status and demonstrates the ability to make significant improvements in function in a reasonable and predictable amount of time.     Precautions / Restrictions Precautions Precautions: Fall Recall of Precautions/Restrictions: Impaired Restrictions Weight Bearing Restrictions Per Provider Order: No      Mobility  Bed Mobility Overal bed mobility: Needs Assistance Bed Mobility: Supine to Sit, Sit to Supine     Supine to sit: Max assist, HOB elevated Sit to  supine: Max assist   General bed mobility comments: cues for initiation    Transfers Overall transfer level: Needs assistance Equipment used: Rolling walker (2 wheels) Transfers: Sit to/from Stand Sit to Stand: Mod assist, +2 safety/equipment           General transfer comment: cues and faciliation for anterior weight shifting. she reports having posterior loss of balance at baseline    Ambulation/Gait             Pre-gait activities: Mod A +2 for side steps using rolling walker. emphasis on increasing base of support, lateral steps, maintaining upright balance. unable to ambulate further at this time    Careers information officer     Tilt Bed    Modified Rankin (Stroke Patients Only)       Balance Overall balance assessment: Needs assistance Sitting-balance support: Feet supported, Feet unsupported, Bilateral upper extremity supported Sitting balance-Leahy Scale: Poor   Postural control: Posterior lean Standing balance support: Bilateral upper extremity supported Standing balance-Leahy Scale: Poor                               Pertinent Vitals/Pain Pain Assessment Pain Assessment: No/denies pain    Home Living Family/patient expects to be discharged to:: Skilled nursing facility                   Additional Comments: rehab after prior hospital stay. her son lives with her at baseline    Prior Function Prior Level of Function : Needs assist;History of Falls (last six  months)             Mobility Comments: unclear. Pt reports at rehab she was working on standing/walking with therapy ADLs Comments: Prior to last admission per chart/pt, she was indep with most ADL and son assisted with showering set up and IADL.     Extremity/Trunk Assessment   Upper Extremity Assessment Upper Extremity Assessment: Generalized weakness    Lower Extremity Assessment Lower Extremity Assessment: Generalized weakness        Communication   Communication Communication: Impaired Factors Affecting Communication: Hearing impaired    Cognition Arousal: Alert Behavior During Therapy: Flat affect   PT - Cognitive impairments: No apparent impairments                         Following commands: Impaired Following commands impaired: Follows one step commands with increased time     Cueing Cueing Techniques: Verbal cues, Gestural cues, Visual cues     General Comments General comments (skin integrity, edema, etc.): patient with nausea and dry heaving after mobilizing which patient states happens frequently    Exercises     Assessment/Plan    PT Assessment Patient needs continued PT services  PT Problem List Decreased strength;Decreased range of motion;Decreased activity tolerance;Decreased balance;Decreased mobility;Decreased safety awareness       PT Treatment Interventions DME instruction;Gait training;Functional mobility training;Therapeutic exercise;Therapeutic activities;Balance training;Neuromuscular re-education;Cognitive remediation;Patient/family education;Wheelchair mobility training    PT Goals (Current goals can be found in the Care Plan section)  Acute Rehab PT Goals Patient Stated Goal: to walk PT Goal Formulation: With patient Time For Goal Achievement: 04/18/24 Potential to Achieve Goals: Fair    Frequency Min 1X/week     Co-evaluation PT/OT/SLP Co-Evaluation/Treatment: Yes Reason for Co-Treatment: For patient/therapist safety;To address functional/ADL transfers PT goals addressed during session: Mobility/safety with mobility;Balance;Proper use of DME OT goals addressed during session: ADL's and self-care;Proper use of Adaptive equipment and DME       AM-PAC PT 6 Clicks Mobility  Outcome Measure Help needed turning from your back to your side while in a flat bed without using bedrails?: A Little Help needed moving from lying on your back to sitting on the side of  a flat bed without using bedrails?: A Lot Help needed moving to and from a bed to a chair (including a wheelchair)?: A Lot Help needed standing up from a chair using your arms (e.g., wheelchair or bedside chair)?: A Lot Help needed to walk in hospital room?: Total Help needed climbing 3-5 steps with a railing? : Total 6 Click Score: 11    End of Session   Activity Tolerance: Patient limited by fatigue Patient left: in bed;with call bell/phone within reach;with bed alarm set Nurse Communication: Mobility status PT Visit Diagnosis: Muscle weakness (generalized) (M62.81);Unsteadiness on feet (R26.81)    Time: 9085-9063 PT Time Calculation (min) (ACUTE ONLY): 22 min   Charges:   PT Evaluation $PT Eval Low Complexity: 1 Low   PT General Charges $$ ACUTE PT VISIT: 1 Visit         Randine Essex, PT, MPT   Randine LULLA Essex 04/04/2024, 12:35 PM

## 2024-04-04 NOTE — Evaluation (Signed)
 Occupational Therapy Evaluation Patient Details Name: Sarah White MRN: [id number] DOB: [birth year] Today's Date: 04/04/2024   History of Present Illness   78 y.o. female with Past medical history of HTN, HLD, DM, chronic systolic CHF, carotid stenosis s/p right ICA stent, CAD, CKD 3a, smoker, COPD presented to ED for code stroke.     Clinical Impressions Pt was seen for OT evaluation and co-tx with PT to optimize safety with ADL mobility this date. Prior to recent hospital admission, pt was indep with ADL and had assist from son for IADL. Since last admission pt has been at [facility] for rehab and reports working with therapy to try and stand and walk. Pt presents to acute OT demonstrating impaired ADL performance and functional mobility 2/2 decreased strength, activity tolerance, questionable awareness of deficits and safety, and impaired balance (See OT problem list for additional functional deficits). Pt currently requires MAX A for bed mobility, MOD A +2 for safety with STS, MOD A +2 physical assist for side step attempts with pt demonstrating difficulty with lifting feet of the ground, and requiring MAX A for LB ADL and pericare in standing. Pt would benefit from skilled OT services to address noted impairments and functional limitations (see below for any additional details) in order to maximize safety and independence while minimizing falls risk and caregiver burden. Anticipate the need for follow up OT services upon acute hospital DC.      If plan is discharge home, recommend the following:   Two people to help with walking and/or transfers;A lot of help with bathing/dressing/bathroom;Direct supervision/assist for medications management;Supervision due to cognitive status;Direct supervision/assist for financial management;Assist for transportation;Assistance with cooking/housework;Help with stairs or ramp for entrance     Functional Status Assessment   Patient has had a recent decline in  their functional status and demonstrates the ability to make significant improvements in function in a reasonable and predictable amount of time.     Equipment Recommendations   Other (comment) (defer)     Recommendations for Other Services         Precautions/Restrictions   Precautions Precautions: Fall Recall of Precautions/Restrictions: Impaired Restrictions Weight Bearing Restrictions Per Provider Order: No     Mobility Bed Mobility Overal bed mobility: Needs Assistance Bed Mobility: Supine to Sit, Sit to Supine     Supine to sit: Max assist, HOB elevated Sit to supine: Max assist        Transfers Overall transfer level: Needs assistance Equipment used: Rolling walker (2 wheels) Transfers: Sit to/from Stand Sit to Stand: Mod assist, +2 safety/equipment           General transfer comment: MOD A +2 for safety with STS, MOD A +2 physical assist with MAX VC for sequencing for taking a couple lateral steps EOB requiring MAX RW mgt assist      Balance Overall balance assessment: Needs assistance Sitting-balance support: Feet supported, Feet unsupported, Bilateral upper extremity supported Sitting balance-Leahy Scale: Poor Sitting balance - Comments: pt with heavy posterior LOB and lean requiring varying assist to correct and VC, pt unable to self correct Postural control: Posterior lean Standing balance support: Bilateral upper extremity supported, Reliant on assistive device for balance Standing balance-Leahy Scale: Poor         ADL either performed or assessed with clinical judgement   ADL Overall ADL's : Needs assistance/impaired     Grooming: Wash/dry face;Bed level;Set up;Oral care     Toileting- Clothing Manipulation and Hygiene: Sit to/from stand;Maximal assistance Toileting -  Clothing Manipulation Details (indicate cue type and reason): pericare in standing for BM              Pertinent Vitals/Pain Pain Assessment Pain Assessment:  No/denies pain     Extremity/Trunk Assessment Upper Extremity Assessment Upper Extremity Assessment: Generalized weakness   Lower Extremity Assessment Lower Extremity Assessment: Generalized weakness       Communication Communication Communication: Impaired Factors Affecting Communication: Hearing impaired   Cognition Arousal: Alert Behavior During Therapy: Flat affect Cognition: No family/caregiver present to determine baseline             OT - Cognition Comments: Pt with questionable memory and safety awareness                 Following commands: Impaired Following commands impaired: Follows one step commands with increased time     Cueing  General Comments   Cueing Techniques: Verbal cues;Gestural cues;Visual cues              Home Living Family/patient expects to be discharged to:: Skilled nursing facility       Additional Comments: Pt at rehab after previous admission. Was living with her son prior to that admission in [facility], [address] w/ L rail.      Prior Functioning/Environment Prior Level of Function : Needs assist;History of Falls (last six months)             Mobility Comments: unclear. Pt reports at rehab she was working on standing/walking with therapy ADLs Comments: Prior to last admission per chart/pt, she was indep with most ADL and son assisted with showering set up and IADL.    OT Problem List: Decreased strength;Decreased safety awareness;Decreased activity tolerance;Impaired balance (sitting and/or standing);Decreased knowledge of use of DME or AE   OT Treatment/Interventions: Self-care/ADL training;Therapeutic exercise;Therapeutic activities;Energy conservation;DME and/or AE instruction;Patient/family education;Balance training;Cognitive remediation/compensation      OT Goals(Current goals can be found in the care plan section)   Acute Rehab OT Goals Patient Stated Goal: get better and walk again OT Goal Formulation: With  patient Time For Goal Achievement: 04/18/24 Potential to Achieve Goals: Fair ADL Goals Pt Will Transfer to Toilet: bedside commode;stand pivot transfer;with mod assist Pt Will Perform Toileting - Clothing Manipulation and hygiene: sit to/from stand;with mod assist Additional ADL Goal #1: Pt will tolerate sitting EOB wiht supv for seated grooming task with no LOB, >83min.   OT Frequency:  Min 1X/week    Co-evaluation PT/OT/SLP Co-Evaluation/Treatment: Yes Reason for Co-Treatment: For patient/therapist safety;To address functional/ADL transfers PT goals addressed during session: Mobility/safety with mobility;Balance;Proper use of DME OT goals addressed during session: ADL's and self-care;Proper use of Adaptive equipment and DME      AM-PAC OT 6 Clicks Daily Activity     Outcome Measure Help from another person eating meals?: None Help from another person taking care of personal grooming?: A Little Help from another person toileting, which includes using toliet, bedpan, or urinal?: A Lot Help from another person bathing (including washing, rinsing, drying)?: A Lot Help from another person to put on and taking off regular upper body clothing?: A Lot Help from another person to put on and taking off regular lower body clothing?: A Lot 6 Click Score: 15   End of Session Equipment Utilized During Treatment: Oxygen Nurse Communication: Other (comment);Mobility status (nausea)  Activity Tolerance: Patient tolerated treatment well;Other (comment) (nausea) Patient left: in bed;with call bell/phone within reach;with bed alarm set  OT Visit Diagnosis: Other abnormalities of gait and mobility (  R26.89);Muscle weakness (generalized) (M62.81)                Time: 9086-9063 OT Time Calculation (min): 23 min Charges:  OT General Charges $OT Visit: 1 Visit OT Evaluation $OT Eval Moderate Complexity: 1 Mod  Warren SAUNDERS., MPH, MS, OTR/L ascom [phone] 04/04/24, 11:00 AM

## 2024-04-05 DIAGNOSIS — I5020 Unspecified systolic (congestive) heart failure: Secondary | ICD-10-CM | POA: Diagnosis not present

## 2024-04-06 DIAGNOSIS — Z7401 Bed confinement status: Secondary | ICD-10-CM | POA: Diagnosis not present

## 2024-04-06 DIAGNOSIS — J449 Chronic obstructive pulmonary disease, unspecified: Secondary | ICD-10-CM | POA: Diagnosis not present

## 2024-04-10 DIAGNOSIS — J441 Chronic obstructive pulmonary disease with (acute) exacerbation: Secondary | ICD-10-CM | POA: Diagnosis not present

## 2024-04-10 DIAGNOSIS — J22 Unspecified acute lower respiratory infection: Secondary | ICD-10-CM | POA: Diagnosis not present

## 2024-04-10 DIAGNOSIS — I5020 Unspecified systolic (congestive) heart failure: Secondary | ICD-10-CM | POA: Diagnosis not present

## 2024-04-10 DIAGNOSIS — J440 Chronic obstructive pulmonary disease with acute lower respiratory infection: Secondary | ICD-10-CM | POA: Diagnosis not present

## 2024-04-10 DIAGNOSIS — I959 Hypotension, unspecified: Secondary | ICD-10-CM | POA: Diagnosis not present

## 2024-04-11 DIAGNOSIS — F331 Major depressive disorder, recurrent, moderate: Secondary | ICD-10-CM | POA: Diagnosis not present

## 2024-04-11 DIAGNOSIS — F5101 Primary insomnia: Secondary | ICD-10-CM | POA: Diagnosis not present

## 2024-04-12 DIAGNOSIS — F331 Major depressive disorder, recurrent, moderate: Secondary | ICD-10-CM | POA: Diagnosis not present

## 2024-04-12 DIAGNOSIS — F5101 Primary insomnia: Secondary | ICD-10-CM | POA: Diagnosis not present

## 2024-04-20 DIAGNOSIS — E785 Hyperlipidemia, unspecified: Secondary | ICD-10-CM | POA: Diagnosis not present

## 2024-04-20 DIAGNOSIS — I2510 Atherosclerotic heart disease of native coronary artery without angina pectoris: Secondary | ICD-10-CM | POA: Diagnosis not present

## 2024-04-20 DIAGNOSIS — C3490 Malignant neoplasm of unspecified part of unspecified bronchus or lung: Secondary | ICD-10-CM | POA: Diagnosis not present

## 2024-04-20 DIAGNOSIS — I679 Cerebrovascular disease, unspecified: Secondary | ICD-10-CM | POA: Diagnosis not present

## 2024-04-20 DIAGNOSIS — Z9981 Dependence on supplemental oxygen: Secondary | ICD-10-CM | POA: Diagnosis not present

## 2024-04-20 DIAGNOSIS — I5020 Unspecified systolic (congestive) heart failure: Secondary | ICD-10-CM | POA: Diagnosis not present

## 2024-04-20 DIAGNOSIS — E119 Type 2 diabetes mellitus without complications: Secondary | ICD-10-CM | POA: Diagnosis not present

## 2024-04-20 DIAGNOSIS — I10 Essential (primary) hypertension: Secondary | ICD-10-CM | POA: Diagnosis not present

## 2024-04-20 DIAGNOSIS — J188 Other pneumonia, unspecified organism: Secondary | ICD-10-CM | POA: Diagnosis not present

## 2024-04-20 DIAGNOSIS — K219 Gastro-esophageal reflux disease without esophagitis: Secondary | ICD-10-CM | POA: Diagnosis not present

## 2024-04-20 DIAGNOSIS — R296 Repeated falls: Secondary | ICD-10-CM | POA: Diagnosis not present

## 2024-04-20 DIAGNOSIS — J449 Chronic obstructive pulmonary disease, unspecified: Secondary | ICD-10-CM | POA: Diagnosis not present

## 2024-04-20 DIAGNOSIS — J9601 Acute respiratory failure with hypoxia: Secondary | ICD-10-CM | POA: Diagnosis not present

## 2024-04-21 ENCOUNTER — Telehealth: Payer: Self-pay | Admitting: Family Medicine

## 2024-04-21 DIAGNOSIS — C3490 Malignant neoplasm of unspecified part of unspecified bronchus or lung: Secondary | ICD-10-CM | POA: Diagnosis not present

## 2024-04-21 DIAGNOSIS — I951 Orthostatic hypotension: Secondary | ICD-10-CM

## 2024-04-21 DIAGNOSIS — R55 Syncope and collapse: Secondary | ICD-10-CM

## 2024-04-21 DIAGNOSIS — B370 Candidal stomatitis: Secondary | ICD-10-CM

## 2024-04-21 DIAGNOSIS — J449 Chronic obstructive pulmonary disease, unspecified: Secondary | ICD-10-CM

## 2024-04-21 DIAGNOSIS — C3491 Malignant neoplasm of unspecified part of right bronchus or lung: Secondary | ICD-10-CM

## 2024-04-21 DIAGNOSIS — J9601 Acute respiratory failure with hypoxia: Secondary | ICD-10-CM

## 2024-04-21 DIAGNOSIS — I679 Cerebrovascular disease, unspecified: Secondary | ICD-10-CM | POA: Diagnosis not present

## 2024-04-21 DIAGNOSIS — J441 Chronic obstructive pulmonary disease with (acute) exacerbation: Secondary | ICD-10-CM

## 2024-04-21 DIAGNOSIS — I6523 Occlusion and stenosis of bilateral carotid arteries: Secondary | ICD-10-CM

## 2024-04-21 DIAGNOSIS — I2510 Atherosclerotic heart disease of native coronary artery without angina pectoris: Secondary | ICD-10-CM

## 2024-04-21 DIAGNOSIS — I200 Unstable angina: Secondary | ICD-10-CM

## 2024-04-21 DIAGNOSIS — K529 Noninfective gastroenteritis and colitis, unspecified: Secondary | ICD-10-CM

## 2024-04-21 DIAGNOSIS — J189 Pneumonia, unspecified organism: Secondary | ICD-10-CM

## 2024-04-21 DIAGNOSIS — I700 Atherosclerosis of aorta: Secondary | ICD-10-CM

## 2024-04-21 DIAGNOSIS — I10 Essential (primary) hypertension: Secondary | ICD-10-CM

## 2024-04-21 DIAGNOSIS — I5020 Unspecified systolic (congestive) heart failure: Secondary | ICD-10-CM

## 2024-04-21 DIAGNOSIS — I5022 Chronic systolic (congestive) heart failure: Secondary | ICD-10-CM

## 2024-04-21 DIAGNOSIS — E785 Hyperlipidemia, unspecified: Secondary | ICD-10-CM | POA: Diagnosis not present

## 2024-04-21 DIAGNOSIS — K319 Disease of stomach and duodenum, unspecified: Secondary | ICD-10-CM

## 2024-04-21 DIAGNOSIS — I6529 Occlusion and stenosis of unspecified carotid artery: Secondary | ICD-10-CM

## 2024-04-21 NOTE — Telephone Encounter (Signed)
 Patient's son came in stating that hospice has been requesting information from us  on behalf of Sarah White. Janalyn is now bedridden and unable to leave her house. I did cancel the appt for 08/25.  Hospice ([facility]) Chiquita Mech [phone] is the hospice Nurse

## 2024-04-24 ENCOUNTER — Ambulatory Visit: Admitting: Family Medicine

## 2024-04-24 DIAGNOSIS — C3490 Malignant neoplasm of unspecified part of unspecified bronchus or lung: Secondary | ICD-10-CM | POA: Diagnosis not present

## 2024-04-24 DIAGNOSIS — I679 Cerebrovascular disease, unspecified: Secondary | ICD-10-CM | POA: Diagnosis not present

## 2024-04-24 DIAGNOSIS — E785 Hyperlipidemia, unspecified: Secondary | ICD-10-CM | POA: Diagnosis not present

## 2024-04-24 DIAGNOSIS — I5020 Unspecified systolic (congestive) heart failure: Secondary | ICD-10-CM | POA: Diagnosis not present

## 2024-04-24 DIAGNOSIS — I10 Essential (primary) hypertension: Secondary | ICD-10-CM | POA: Diagnosis not present

## 2024-04-24 DIAGNOSIS — I2510 Atherosclerotic heart disease of native coronary artery without angina pectoris: Secondary | ICD-10-CM | POA: Diagnosis not present

## 2024-04-25 DIAGNOSIS — I10 Essential (primary) hypertension: Secondary | ICD-10-CM | POA: Diagnosis not present

## 2024-04-25 DIAGNOSIS — I5020 Unspecified systolic (congestive) heart failure: Secondary | ICD-10-CM | POA: Diagnosis not present

## 2024-04-25 DIAGNOSIS — I2510 Atherosclerotic heart disease of native coronary artery without angina pectoris: Secondary | ICD-10-CM | POA: Diagnosis not present

## 2024-04-25 DIAGNOSIS — E785 Hyperlipidemia, unspecified: Secondary | ICD-10-CM | POA: Diagnosis not present

## 2024-04-25 DIAGNOSIS — C3490 Malignant neoplasm of unspecified part of unspecified bronchus or lung: Secondary | ICD-10-CM | POA: Diagnosis not present

## 2024-04-25 DIAGNOSIS — I679 Cerebrovascular disease, unspecified: Secondary | ICD-10-CM | POA: Diagnosis not present

## 2024-04-25 NOTE — Telephone Encounter (Signed)
 Confirmed with Son that this is his wishes and he does in fact want to proceed with hospice.

## 2024-04-25 NOTE — Telephone Encounter (Signed)
 I am happy to order hospice as long as son is OK with it.

## 2024-04-25 NOTE — Telephone Encounter (Signed)
 Returned call to [employer]. She reached out as patients son is requesting PCS for his mom and that her company has been brought in. She is requesting if PCP agree's for a referral for hospice care. She mentions she is in fact bedridden at this point and that her labs have trended down so if PCP agrees the will start her on hospice.

## 2024-04-27 DIAGNOSIS — E785 Hyperlipidemia, unspecified: Secondary | ICD-10-CM | POA: Diagnosis not present

## 2024-04-27 DIAGNOSIS — I5020 Unspecified systolic (congestive) heart failure: Secondary | ICD-10-CM | POA: Diagnosis not present

## 2024-04-27 DIAGNOSIS — I2510 Atherosclerotic heart disease of native coronary artery without angina pectoris: Secondary | ICD-10-CM | POA: Diagnosis not present

## 2024-04-27 DIAGNOSIS — C3490 Malignant neoplasm of unspecified part of unspecified bronchus or lung: Secondary | ICD-10-CM | POA: Diagnosis not present

## 2024-04-27 DIAGNOSIS — I679 Cerebrovascular disease, unspecified: Secondary | ICD-10-CM | POA: Diagnosis not present

## 2024-04-27 DIAGNOSIS — I10 Essential (primary) hypertension: Secondary | ICD-10-CM | POA: Diagnosis not present

## 2024-04-28 DIAGNOSIS — I6529 Occlusion and stenosis of unspecified carotid artery: Secondary | ICD-10-CM | POA: Diagnosis not present

## 2024-04-28 DIAGNOSIS — I5022 Chronic systolic (congestive) heart failure: Secondary | ICD-10-CM | POA: Diagnosis not present

## 2024-04-28 DIAGNOSIS — I2510 Atherosclerotic heart disease of native coronary artery without angina pectoris: Secondary | ICD-10-CM | POA: Diagnosis not present

## 2024-04-28 DIAGNOSIS — F0390 Unspecified dementia without behavioral disturbance: Secondary | ICD-10-CM | POA: Diagnosis not present

## 2024-04-28 DIAGNOSIS — C3491 Malignant neoplasm of unspecified part of right bronchus or lung: Secondary | ICD-10-CM | POA: Diagnosis not present

## 2024-04-28 DIAGNOSIS — I6523 Occlusion and stenosis of bilateral carotid arteries: Secondary | ICD-10-CM | POA: Diagnosis not present

## 2024-04-28 DIAGNOSIS — E119 Type 2 diabetes mellitus without complications: Secondary | ICD-10-CM | POA: Diagnosis not present

## 2024-04-28 DIAGNOSIS — I700 Atherosclerosis of aorta: Secondary | ICD-10-CM | POA: Diagnosis not present

## 2024-04-28 DIAGNOSIS — J441 Chronic obstructive pulmonary disease with (acute) exacerbation: Secondary | ICD-10-CM | POA: Diagnosis not present

## 2024-04-28 DIAGNOSIS — I10 Essential (primary) hypertension: Secondary | ICD-10-CM | POA: Diagnosis not present

## 2024-04-30 DIAGNOSIS — F0390 Unspecified dementia without behavioral disturbance: Secondary | ICD-10-CM | POA: Diagnosis not present

## 2024-04-30 DIAGNOSIS — I700 Atherosclerosis of aorta: Secondary | ICD-10-CM | POA: Diagnosis not present

## 2024-04-30 DIAGNOSIS — I2510 Atherosclerotic heart disease of native coronary artery without angina pectoris: Secondary | ICD-10-CM | POA: Diagnosis not present

## 2024-04-30 DIAGNOSIS — I6529 Occlusion and stenosis of unspecified carotid artery: Secondary | ICD-10-CM | POA: Diagnosis not present

## 2024-04-30 DIAGNOSIS — J441 Chronic obstructive pulmonary disease with (acute) exacerbation: Secondary | ICD-10-CM | POA: Diagnosis not present

## 2024-04-30 DIAGNOSIS — I6523 Occlusion and stenosis of bilateral carotid arteries: Secondary | ICD-10-CM | POA: Diagnosis not present

## 2024-05-01 DIAGNOSIS — I700 Atherosclerosis of aorta: Secondary | ICD-10-CM | POA: Diagnosis not present

## 2024-05-01 DIAGNOSIS — E119 Type 2 diabetes mellitus without complications: Secondary | ICD-10-CM | POA: Diagnosis not present

## 2024-05-01 DIAGNOSIS — I6529 Occlusion and stenosis of unspecified carotid artery: Secondary | ICD-10-CM | POA: Diagnosis not present

## 2024-05-01 DIAGNOSIS — J441 Chronic obstructive pulmonary disease with (acute) exacerbation: Secondary | ICD-10-CM | POA: Diagnosis not present

## 2024-05-01 DIAGNOSIS — I2510 Atherosclerotic heart disease of native coronary artery without angina pectoris: Secondary | ICD-10-CM | POA: Diagnosis not present

## 2024-05-01 DIAGNOSIS — I10 Essential (primary) hypertension: Secondary | ICD-10-CM | POA: Diagnosis not present

## 2024-05-01 DIAGNOSIS — F0390 Unspecified dementia without behavioral disturbance: Secondary | ICD-10-CM | POA: Diagnosis not present

## 2024-05-01 DIAGNOSIS — I5022 Chronic systolic (congestive) heart failure: Secondary | ICD-10-CM | POA: Diagnosis not present

## 2024-05-01 DIAGNOSIS — C3491 Malignant neoplasm of unspecified part of right bronchus or lung: Secondary | ICD-10-CM | POA: Diagnosis not present

## 2024-05-01 DIAGNOSIS — I6523 Occlusion and stenosis of bilateral carotid arteries: Secondary | ICD-10-CM | POA: Diagnosis not present

## 2024-05-02 ENCOUNTER — Ambulatory Visit
Admission: RE | Admit: 2024-05-02 | Discharge: 2024-05-02 | Disposition: A | Discharge status: Home / Self Care | Source: Ambulatory Visit | Attending: Radiation Oncology | Admitting: Radiation Oncology

## 2024-05-02 DIAGNOSIS — J439 Emphysema, unspecified: Secondary | ICD-10-CM | POA: Diagnosis not present

## 2024-05-02 DIAGNOSIS — I700 Atherosclerosis of aorta: Secondary | ICD-10-CM | POA: Diagnosis not present

## 2024-05-02 DIAGNOSIS — R911 Solitary pulmonary nodule: Secondary | ICD-10-CM | POA: Diagnosis present

## 2024-05-02 DIAGNOSIS — F0390 Unspecified dementia without behavioral disturbance: Secondary | ICD-10-CM | POA: Diagnosis not present

## 2024-05-02 DIAGNOSIS — I6523 Occlusion and stenosis of bilateral carotid arteries: Secondary | ICD-10-CM | POA: Diagnosis not present

## 2024-05-02 DIAGNOSIS — J441 Chronic obstructive pulmonary disease with (acute) exacerbation: Secondary | ICD-10-CM | POA: Diagnosis not present

## 2024-05-02 DIAGNOSIS — C3490 Malignant neoplasm of unspecified part of unspecified bronchus or lung: Secondary | ICD-10-CM | POA: Diagnosis not present

## 2024-05-02 DIAGNOSIS — I6529 Occlusion and stenosis of unspecified carotid artery: Secondary | ICD-10-CM | POA: Diagnosis not present

## 2024-05-02 DIAGNOSIS — I2510 Atherosclerotic heart disease of native coronary artery without angina pectoris: Secondary | ICD-10-CM | POA: Diagnosis not present

## 2024-05-02 MED ORDER — IOHEXOL 300 MG/ML  SOLN
75.0000 mL | Freq: Once | INTRAMUSCULAR | Status: AC | PRN
  Administered 2024-05-02: 75 mL via INTRAVENOUS

## 2024-05-03 DIAGNOSIS — I700 Atherosclerosis of aorta: Secondary | ICD-10-CM | POA: Diagnosis not present

## 2024-05-03 DIAGNOSIS — I5020 Unspecified systolic (congestive) heart failure: Secondary | ICD-10-CM | POA: Diagnosis not present

## 2024-05-03 DIAGNOSIS — I6529 Occlusion and stenosis of unspecified carotid artery: Secondary | ICD-10-CM | POA: Diagnosis not present

## 2024-05-03 DIAGNOSIS — R296 Repeated falls: Secondary | ICD-10-CM | POA: Diagnosis not present

## 2024-05-03 DIAGNOSIS — I2510 Atherosclerotic heart disease of native coronary artery without angina pectoris: Secondary | ICD-10-CM | POA: Diagnosis not present

## 2024-05-03 DIAGNOSIS — J441 Chronic obstructive pulmonary disease with (acute) exacerbation: Secondary | ICD-10-CM | POA: Diagnosis not present

## 2024-05-03 DIAGNOSIS — F0390 Unspecified dementia without behavioral disturbance: Secondary | ICD-10-CM | POA: Diagnosis not present

## 2024-05-03 DIAGNOSIS — I6523 Occlusion and stenosis of bilateral carotid arteries: Secondary | ICD-10-CM | POA: Diagnosis not present

## 2024-05-09 DIAGNOSIS — F0390 Unspecified dementia without behavioral disturbance: Secondary | ICD-10-CM | POA: Diagnosis not present

## 2024-05-09 DIAGNOSIS — I2510 Atherosclerotic heart disease of native coronary artery without angina pectoris: Secondary | ICD-10-CM | POA: Diagnosis not present

## 2024-05-09 DIAGNOSIS — I6529 Occlusion and stenosis of unspecified carotid artery: Secondary | ICD-10-CM | POA: Diagnosis not present

## 2024-05-09 DIAGNOSIS — F5101 Primary insomnia: Secondary | ICD-10-CM | POA: Diagnosis not present

## 2024-05-09 DIAGNOSIS — I6523 Occlusion and stenosis of bilateral carotid arteries: Secondary | ICD-10-CM | POA: Diagnosis not present

## 2024-05-09 DIAGNOSIS — F331 Major depressive disorder, recurrent, moderate: Secondary | ICD-10-CM | POA: Diagnosis not present

## 2024-05-09 DIAGNOSIS — I700 Atherosclerosis of aorta: Secondary | ICD-10-CM | POA: Diagnosis not present

## 2024-05-09 DIAGNOSIS — J441 Chronic obstructive pulmonary disease with (acute) exacerbation: Secondary | ICD-10-CM | POA: Diagnosis not present

## 2024-05-11 DIAGNOSIS — J441 Chronic obstructive pulmonary disease with (acute) exacerbation: Secondary | ICD-10-CM | POA: Diagnosis not present

## 2024-05-11 DIAGNOSIS — I2510 Atherosclerotic heart disease of native coronary artery without angina pectoris: Secondary | ICD-10-CM | POA: Diagnosis not present

## 2024-05-11 DIAGNOSIS — I6529 Occlusion and stenosis of unspecified carotid artery: Secondary | ICD-10-CM | POA: Diagnosis not present

## 2024-05-11 DIAGNOSIS — F0390 Unspecified dementia without behavioral disturbance: Secondary | ICD-10-CM | POA: Diagnosis not present

## 2024-05-11 DIAGNOSIS — I6523 Occlusion and stenosis of bilateral carotid arteries: Secondary | ICD-10-CM | POA: Diagnosis not present

## 2024-05-11 DIAGNOSIS — I700 Atherosclerosis of aorta: Secondary | ICD-10-CM | POA: Diagnosis not present

## 2024-05-12 DIAGNOSIS — J441 Chronic obstructive pulmonary disease with (acute) exacerbation: Secondary | ICD-10-CM | POA: Diagnosis not present

## 2024-05-12 DIAGNOSIS — I6523 Occlusion and stenosis of bilateral carotid arteries: Secondary | ICD-10-CM | POA: Diagnosis not present

## 2024-05-12 DIAGNOSIS — I5020 Unspecified systolic (congestive) heart failure: Secondary | ICD-10-CM | POA: Diagnosis not present

## 2024-05-12 DIAGNOSIS — I6529 Occlusion and stenosis of unspecified carotid artery: Secondary | ICD-10-CM | POA: Diagnosis not present

## 2024-05-12 DIAGNOSIS — I700 Atherosclerosis of aorta: Secondary | ICD-10-CM | POA: Diagnosis not present

## 2024-05-12 DIAGNOSIS — I2510 Atherosclerotic heart disease of native coronary artery without angina pectoris: Secondary | ICD-10-CM | POA: Diagnosis not present

## 2024-05-12 DIAGNOSIS — J449 Chronic obstructive pulmonary disease, unspecified: Secondary | ICD-10-CM | POA: Diagnosis not present

## 2024-05-12 DIAGNOSIS — F0390 Unspecified dementia without behavioral disturbance: Secondary | ICD-10-CM | POA: Diagnosis not present

## 2024-05-15 DIAGNOSIS — I6529 Occlusion and stenosis of unspecified carotid artery: Secondary | ICD-10-CM | POA: Diagnosis not present

## 2024-05-15 DIAGNOSIS — J441 Chronic obstructive pulmonary disease with (acute) exacerbation: Secondary | ICD-10-CM | POA: Diagnosis not present

## 2024-05-15 DIAGNOSIS — I6523 Occlusion and stenosis of bilateral carotid arteries: Secondary | ICD-10-CM | POA: Diagnosis not present

## 2024-05-15 DIAGNOSIS — F0390 Unspecified dementia without behavioral disturbance: Secondary | ICD-10-CM | POA: Diagnosis not present

## 2024-05-15 DIAGNOSIS — I2510 Atherosclerotic heart disease of native coronary artery without angina pectoris: Secondary | ICD-10-CM | POA: Diagnosis not present

## 2024-05-15 DIAGNOSIS — I700 Atherosclerosis of aorta: Secondary | ICD-10-CM | POA: Diagnosis not present

## 2024-05-17 DIAGNOSIS — I700 Atherosclerosis of aorta: Secondary | ICD-10-CM | POA: Diagnosis not present

## 2024-05-17 DIAGNOSIS — J441 Chronic obstructive pulmonary disease with (acute) exacerbation: Secondary | ICD-10-CM | POA: Diagnosis not present

## 2024-05-17 DIAGNOSIS — F0390 Unspecified dementia without behavioral disturbance: Secondary | ICD-10-CM | POA: Diagnosis not present

## 2024-05-17 DIAGNOSIS — I6523 Occlusion and stenosis of bilateral carotid arteries: Secondary | ICD-10-CM | POA: Diagnosis not present

## 2024-05-17 DIAGNOSIS — I6529 Occlusion and stenosis of unspecified carotid artery: Secondary | ICD-10-CM | POA: Diagnosis not present

## 2024-05-17 DIAGNOSIS — I2510 Atherosclerotic heart disease of native coronary artery without angina pectoris: Secondary | ICD-10-CM | POA: Diagnosis not present

## 2024-05-18 DIAGNOSIS — J441 Chronic obstructive pulmonary disease with (acute) exacerbation: Secondary | ICD-10-CM | POA: Diagnosis not present

## 2024-05-18 DIAGNOSIS — I2510 Atherosclerotic heart disease of native coronary artery without angina pectoris: Secondary | ICD-10-CM | POA: Diagnosis not present

## 2024-05-18 DIAGNOSIS — I10 Essential (primary) hypertension: Secondary | ICD-10-CM | POA: Diagnosis not present

## 2024-05-18 DIAGNOSIS — E119 Type 2 diabetes mellitus without complications: Secondary | ICD-10-CM | POA: Diagnosis not present

## 2024-05-18 DIAGNOSIS — I6529 Occlusion and stenosis of unspecified carotid artery: Secondary | ICD-10-CM | POA: Diagnosis not present

## 2024-05-18 DIAGNOSIS — J449 Chronic obstructive pulmonary disease, unspecified: Secondary | ICD-10-CM | POA: Diagnosis not present

## 2024-05-18 DIAGNOSIS — I5020 Unspecified systolic (congestive) heart failure: Secondary | ICD-10-CM | POA: Diagnosis not present

## 2024-05-18 DIAGNOSIS — I700 Atherosclerosis of aorta: Secondary | ICD-10-CM | POA: Diagnosis not present

## 2024-05-18 DIAGNOSIS — I6523 Occlusion and stenosis of bilateral carotid arteries: Secondary | ICD-10-CM | POA: Diagnosis not present

## 2024-05-18 DIAGNOSIS — F0390 Unspecified dementia without behavioral disturbance: Secondary | ICD-10-CM | POA: Diagnosis not present

## 2024-05-19 DIAGNOSIS — E119 Type 2 diabetes mellitus without complications: Secondary | ICD-10-CM | POA: Diagnosis not present

## 2024-05-19 DIAGNOSIS — I700 Atherosclerosis of aorta: Secondary | ICD-10-CM | POA: Diagnosis not present

## 2024-05-19 DIAGNOSIS — J441 Chronic obstructive pulmonary disease with (acute) exacerbation: Secondary | ICD-10-CM | POA: Diagnosis not present

## 2024-05-19 DIAGNOSIS — I6529 Occlusion and stenosis of unspecified carotid artery: Secondary | ICD-10-CM | POA: Diagnosis not present

## 2024-05-19 DIAGNOSIS — I2510 Atherosclerotic heart disease of native coronary artery without angina pectoris: Secondary | ICD-10-CM | POA: Diagnosis not present

## 2024-05-19 DIAGNOSIS — I6523 Occlusion and stenosis of bilateral carotid arteries: Secondary | ICD-10-CM | POA: Diagnosis not present

## 2024-05-19 DIAGNOSIS — F0390 Unspecified dementia without behavioral disturbance: Secondary | ICD-10-CM | POA: Diagnosis not present

## 2024-05-23 DIAGNOSIS — I700 Atherosclerosis of aorta: Secondary | ICD-10-CM | POA: Diagnosis not present

## 2024-05-23 DIAGNOSIS — I6523 Occlusion and stenosis of bilateral carotid arteries: Secondary | ICD-10-CM | POA: Diagnosis not present

## 2024-05-23 DIAGNOSIS — I2510 Atherosclerotic heart disease of native coronary artery without angina pectoris: Secondary | ICD-10-CM | POA: Diagnosis not present

## 2024-05-23 DIAGNOSIS — F0390 Unspecified dementia without behavioral disturbance: Secondary | ICD-10-CM | POA: Diagnosis not present

## 2024-05-23 DIAGNOSIS — J441 Chronic obstructive pulmonary disease with (acute) exacerbation: Secondary | ICD-10-CM | POA: Diagnosis not present

## 2024-05-23 DIAGNOSIS — I6529 Occlusion and stenosis of unspecified carotid artery: Secondary | ICD-10-CM | POA: Diagnosis not present

## 2024-05-25 DIAGNOSIS — I6523 Occlusion and stenosis of bilateral carotid arteries: Secondary | ICD-10-CM | POA: Diagnosis not present

## 2024-05-25 DIAGNOSIS — F0390 Unspecified dementia without behavioral disturbance: Secondary | ICD-10-CM | POA: Diagnosis not present

## 2024-05-25 DIAGNOSIS — I2510 Atherosclerotic heart disease of native coronary artery without angina pectoris: Secondary | ICD-10-CM | POA: Diagnosis not present

## 2024-05-25 DIAGNOSIS — J441 Chronic obstructive pulmonary disease with (acute) exacerbation: Secondary | ICD-10-CM | POA: Diagnosis not present

## 2024-05-25 DIAGNOSIS — I6529 Occlusion and stenosis of unspecified carotid artery: Secondary | ICD-10-CM | POA: Diagnosis not present

## 2024-05-25 DIAGNOSIS — I700 Atherosclerosis of aorta: Secondary | ICD-10-CM | POA: Diagnosis not present

## 2024-05-26 DIAGNOSIS — I2510 Atherosclerotic heart disease of native coronary artery without angina pectoris: Secondary | ICD-10-CM | POA: Diagnosis not present

## 2024-05-26 DIAGNOSIS — F0390 Unspecified dementia without behavioral disturbance: Secondary | ICD-10-CM | POA: Diagnosis not present

## 2024-05-26 DIAGNOSIS — I6529 Occlusion and stenosis of unspecified carotid artery: Secondary | ICD-10-CM | POA: Diagnosis not present

## 2024-05-26 DIAGNOSIS — I6523 Occlusion and stenosis of bilateral carotid arteries: Secondary | ICD-10-CM | POA: Diagnosis not present

## 2024-05-26 DIAGNOSIS — J441 Chronic obstructive pulmonary disease with (acute) exacerbation: Secondary | ICD-10-CM | POA: Diagnosis not present

## 2024-05-26 DIAGNOSIS — I700 Atherosclerosis of aorta: Secondary | ICD-10-CM | POA: Diagnosis not present

## 2024-05-29 DIAGNOSIS — J441 Chronic obstructive pulmonary disease with (acute) exacerbation: Secondary | ICD-10-CM | POA: Diagnosis not present

## 2024-05-29 DIAGNOSIS — I6529 Occlusion and stenosis of unspecified carotid artery: Secondary | ICD-10-CM | POA: Diagnosis not present

## 2024-05-29 DIAGNOSIS — F5101 Primary insomnia: Secondary | ICD-10-CM | POA: Diagnosis not present

## 2024-05-29 DIAGNOSIS — I6523 Occlusion and stenosis of bilateral carotid arteries: Secondary | ICD-10-CM | POA: Diagnosis not present

## 2024-05-29 DIAGNOSIS — F0390 Unspecified dementia without behavioral disturbance: Secondary | ICD-10-CM | POA: Diagnosis not present

## 2024-05-29 DIAGNOSIS — F331 Major depressive disorder, recurrent, moderate: Secondary | ICD-10-CM | POA: Diagnosis not present

## 2024-05-29 DIAGNOSIS — I2510 Atherosclerotic heart disease of native coronary artery without angina pectoris: Secondary | ICD-10-CM | POA: Diagnosis not present

## 2024-05-29 DIAGNOSIS — I700 Atherosclerosis of aorta: Secondary | ICD-10-CM | POA: Diagnosis not present

## 2024-05-30 DIAGNOSIS — J441 Chronic obstructive pulmonary disease with (acute) exacerbation: Secondary | ICD-10-CM | POA: Diagnosis not present

## 2024-05-30 DIAGNOSIS — F0390 Unspecified dementia without behavioral disturbance: Secondary | ICD-10-CM | POA: Diagnosis not present

## 2024-05-30 DIAGNOSIS — I700 Atherosclerosis of aorta: Secondary | ICD-10-CM | POA: Diagnosis not present

## 2024-05-30 DIAGNOSIS — I6523 Occlusion and stenosis of bilateral carotid arteries: Secondary | ICD-10-CM | POA: Diagnosis not present

## 2024-05-30 DIAGNOSIS — I6529 Occlusion and stenosis of unspecified carotid artery: Secondary | ICD-10-CM | POA: Diagnosis not present

## 2024-05-30 DIAGNOSIS — I2510 Atherosclerotic heart disease of native coronary artery without angina pectoris: Secondary | ICD-10-CM | POA: Diagnosis not present

## 2024-05-31 DIAGNOSIS — I6529 Occlusion and stenosis of unspecified carotid artery: Secondary | ICD-10-CM | POA: Diagnosis not present

## 2024-05-31 DIAGNOSIS — I700 Atherosclerosis of aorta: Secondary | ICD-10-CM | POA: Diagnosis not present

## 2024-05-31 DIAGNOSIS — C3491 Malignant neoplasm of unspecified part of right bronchus or lung: Secondary | ICD-10-CM | POA: Diagnosis not present

## 2024-05-31 DIAGNOSIS — J441 Chronic obstructive pulmonary disease with (acute) exacerbation: Secondary | ICD-10-CM | POA: Diagnosis not present

## 2024-05-31 DIAGNOSIS — I6523 Occlusion and stenosis of bilateral carotid arteries: Secondary | ICD-10-CM | POA: Diagnosis not present

## 2024-05-31 DIAGNOSIS — F0390 Unspecified dementia without behavioral disturbance: Secondary | ICD-10-CM | POA: Diagnosis not present

## 2024-05-31 DIAGNOSIS — I2510 Atherosclerotic heart disease of native coronary artery without angina pectoris: Secondary | ICD-10-CM | POA: Diagnosis not present

## 2024-05-31 DIAGNOSIS — I10 Essential (primary) hypertension: Secondary | ICD-10-CM | POA: Diagnosis not present

## 2024-05-31 DIAGNOSIS — E119 Type 2 diabetes mellitus without complications: Secondary | ICD-10-CM | POA: Diagnosis not present

## 2024-05-31 DIAGNOSIS — I5022 Chronic systolic (congestive) heart failure: Secondary | ICD-10-CM | POA: Diagnosis not present

## 2024-06-01 DIAGNOSIS — I6523 Occlusion and stenosis of bilateral carotid arteries: Secondary | ICD-10-CM | POA: Diagnosis not present

## 2024-06-01 DIAGNOSIS — I6529 Occlusion and stenosis of unspecified carotid artery: Secondary | ICD-10-CM | POA: Diagnosis not present

## 2024-06-01 DIAGNOSIS — J441 Chronic obstructive pulmonary disease with (acute) exacerbation: Secondary | ICD-10-CM | POA: Diagnosis not present

## 2024-06-01 DIAGNOSIS — I700 Atherosclerosis of aorta: Secondary | ICD-10-CM | POA: Diagnosis not present

## 2024-06-01 DIAGNOSIS — I2510 Atherosclerotic heart disease of native coronary artery without angina pectoris: Secondary | ICD-10-CM | POA: Diagnosis not present

## 2024-06-01 DIAGNOSIS — F0390 Unspecified dementia without behavioral disturbance: Secondary | ICD-10-CM | POA: Diagnosis not present

## 2024-06-02 DIAGNOSIS — R634 Abnormal weight loss: Secondary | ICD-10-CM | POA: Diagnosis not present

## 2024-06-02 DIAGNOSIS — E119 Type 2 diabetes mellitus without complications: Secondary | ICD-10-CM | POA: Diagnosis not present

## 2024-06-02 DIAGNOSIS — R110 Nausea: Secondary | ICD-10-CM | POA: Diagnosis not present

## 2024-06-02 DIAGNOSIS — I5020 Unspecified systolic (congestive) heart failure: Secondary | ICD-10-CM | POA: Diagnosis not present

## 2024-06-03 DIAGNOSIS — I6523 Occlusion and stenosis of bilateral carotid arteries: Secondary | ICD-10-CM | POA: Diagnosis not present

## 2024-06-03 DIAGNOSIS — I6529 Occlusion and stenosis of unspecified carotid artery: Secondary | ICD-10-CM | POA: Diagnosis not present

## 2024-06-03 DIAGNOSIS — I2510 Atherosclerotic heart disease of native coronary artery without angina pectoris: Secondary | ICD-10-CM | POA: Diagnosis not present

## 2024-06-03 DIAGNOSIS — I700 Atherosclerosis of aorta: Secondary | ICD-10-CM | POA: Diagnosis not present

## 2024-06-03 DIAGNOSIS — J441 Chronic obstructive pulmonary disease with (acute) exacerbation: Secondary | ICD-10-CM | POA: Diagnosis not present

## 2024-06-03 DIAGNOSIS — F0390 Unspecified dementia without behavioral disturbance: Secondary | ICD-10-CM | POA: Diagnosis not present

## 2024-06-04 DIAGNOSIS — F0390 Unspecified dementia without behavioral disturbance: Secondary | ICD-10-CM | POA: Diagnosis not present

## 2024-06-04 DIAGNOSIS — I2510 Atherosclerotic heart disease of native coronary artery without angina pectoris: Secondary | ICD-10-CM | POA: Diagnosis not present

## 2024-06-04 DIAGNOSIS — E119 Type 2 diabetes mellitus without complications: Secondary | ICD-10-CM | POA: Diagnosis not present

## 2024-06-04 DIAGNOSIS — C3491 Malignant neoplasm of unspecified part of right bronchus or lung: Secondary | ICD-10-CM | POA: Diagnosis not present

## 2024-06-04 DIAGNOSIS — I6523 Occlusion and stenosis of bilateral carotid arteries: Secondary | ICD-10-CM | POA: Diagnosis not present

## 2024-06-04 DIAGNOSIS — G894 Chronic pain syndrome: Secondary | ICD-10-CM | POA: Diagnosis not present

## 2024-06-04 DIAGNOSIS — I700 Atherosclerosis of aorta: Secondary | ICD-10-CM | POA: Diagnosis not present

## 2024-06-04 DIAGNOSIS — I6529 Occlusion and stenosis of unspecified carotid artery: Secondary | ICD-10-CM | POA: Diagnosis not present

## 2024-06-04 DIAGNOSIS — J441 Chronic obstructive pulmonary disease with (acute) exacerbation: Secondary | ICD-10-CM | POA: Diagnosis not present

## 2024-06-04 DIAGNOSIS — F17291 Nicotine dependence, other tobacco product, in remission: Secondary | ICD-10-CM | POA: Diagnosis not present

## 2024-06-04 DIAGNOSIS — R296 Repeated falls: Secondary | ICD-10-CM | POA: Diagnosis not present

## 2024-06-05 DIAGNOSIS — I700 Atherosclerosis of aorta: Secondary | ICD-10-CM | POA: Diagnosis not present

## 2024-06-05 DIAGNOSIS — J441 Chronic obstructive pulmonary disease with (acute) exacerbation: Secondary | ICD-10-CM | POA: Diagnosis not present

## 2024-06-05 DIAGNOSIS — F0390 Unspecified dementia without behavioral disturbance: Secondary | ICD-10-CM | POA: Diagnosis not present

## 2024-06-05 DIAGNOSIS — E119 Type 2 diabetes mellitus without complications: Secondary | ICD-10-CM | POA: Diagnosis not present

## 2024-06-05 DIAGNOSIS — M6259 Muscle wasting and atrophy, not elsewhere classified, multiple sites: Secondary | ICD-10-CM | POA: Diagnosis not present

## 2024-06-05 DIAGNOSIS — I2510 Atherosclerotic heart disease of native coronary artery without angina pectoris: Secondary | ICD-10-CM | POA: Diagnosis not present

## 2024-06-05 DIAGNOSIS — I6523 Occlusion and stenosis of bilateral carotid arteries: Secondary | ICD-10-CM | POA: Diagnosis not present

## 2024-06-05 DIAGNOSIS — I6529 Occlusion and stenosis of unspecified carotid artery: Secondary | ICD-10-CM | POA: Diagnosis not present

## 2024-06-06 DIAGNOSIS — E119 Type 2 diabetes mellitus without complications: Secondary | ICD-10-CM | POA: Diagnosis not present

## 2024-06-06 DIAGNOSIS — I6523 Occlusion and stenosis of bilateral carotid arteries: Secondary | ICD-10-CM | POA: Diagnosis not present

## 2024-06-06 DIAGNOSIS — F0390 Unspecified dementia without behavioral disturbance: Secondary | ICD-10-CM | POA: Diagnosis not present

## 2024-06-06 DIAGNOSIS — I6529 Occlusion and stenosis of unspecified carotid artery: Secondary | ICD-10-CM | POA: Diagnosis not present

## 2024-06-06 DIAGNOSIS — E876 Hypokalemia: Secondary | ICD-10-CM | POA: Diagnosis not present

## 2024-06-06 DIAGNOSIS — I700 Atherosclerosis of aorta: Secondary | ICD-10-CM | POA: Diagnosis not present

## 2024-06-06 DIAGNOSIS — J441 Chronic obstructive pulmonary disease with (acute) exacerbation: Secondary | ICD-10-CM | POA: Diagnosis not present

## 2024-06-06 DIAGNOSIS — R627 Adult failure to thrive: Secondary | ICD-10-CM | POA: Diagnosis not present

## 2024-06-06 DIAGNOSIS — I2510 Atherosclerotic heart disease of native coronary artery without angina pectoris: Secondary | ICD-10-CM | POA: Diagnosis not present

## 2024-06-08 DIAGNOSIS — F0390 Unspecified dementia without behavioral disturbance: Secondary | ICD-10-CM | POA: Diagnosis not present

## 2024-06-08 DIAGNOSIS — J441 Chronic obstructive pulmonary disease with (acute) exacerbation: Secondary | ICD-10-CM | POA: Diagnosis not present

## 2024-06-08 DIAGNOSIS — I6523 Occlusion and stenosis of bilateral carotid arteries: Secondary | ICD-10-CM | POA: Diagnosis not present

## 2024-06-08 DIAGNOSIS — I2510 Atherosclerotic heart disease of native coronary artery without angina pectoris: Secondary | ICD-10-CM | POA: Diagnosis not present

## 2024-06-08 DIAGNOSIS — I6529 Occlusion and stenosis of unspecified carotid artery: Secondary | ICD-10-CM | POA: Diagnosis not present

## 2024-06-08 DIAGNOSIS — I700 Atherosclerosis of aorta: Secondary | ICD-10-CM | POA: Diagnosis not present

## 2024-07-01 DEATH — deceased
